# Patient Record
Sex: Female | Born: 1935 | Race: White | Hispanic: No | Marital: Married | Smoking: Former smoker
Health system: Southern US, Community
[De-identification: ages and names within clinical notes are randomized; demographics above are authoritative.]

## PROBLEM LIST (undated history)

## (undated) DIAGNOSIS — I509 Heart failure, unspecified: Secondary | ICD-10-CM

## (undated) DIAGNOSIS — R600 Localized edema: Secondary | ICD-10-CM

## (undated) DIAGNOSIS — IMO0002 Reserved for concepts with insufficient information to code with codable children: Secondary | ICD-10-CM

## (undated) DIAGNOSIS — J40 Bronchitis, not specified as acute or chronic: Secondary | ICD-10-CM

## (undated) DIAGNOSIS — Z5189 Encounter for other specified aftercare: Secondary | ICD-10-CM

## (undated) DIAGNOSIS — E6689 Other obesity not elsewhere classified: Secondary | ICD-10-CM

## (undated) DIAGNOSIS — Z860100 Personal history of colon polyps, unspecified: Secondary | ICD-10-CM

## (undated) DIAGNOSIS — J019 Acute sinusitis, unspecified: Secondary | ICD-10-CM

## (undated) DIAGNOSIS — J449 Chronic obstructive pulmonary disease, unspecified: Secondary | ICD-10-CM

## (undated) DIAGNOSIS — E668 Other obesity: Secondary | ICD-10-CM

## (undated) DIAGNOSIS — K219 Gastro-esophageal reflux disease without esophagitis: Secondary | ICD-10-CM

## (undated) DIAGNOSIS — L299 Pruritus, unspecified: Secondary | ICD-10-CM

## (undated) DIAGNOSIS — M810 Age-related osteoporosis without current pathological fracture: Secondary | ICD-10-CM

## (undated) DIAGNOSIS — I5031 Acute diastolic (congestive) heart failure: Secondary | ICD-10-CM

## (undated) DIAGNOSIS — I2510 Atherosclerotic heart disease of native coronary artery without angina pectoris: Secondary | ICD-10-CM

## (undated) DIAGNOSIS — I639 Cerebral infarction, unspecified: Secondary | ICD-10-CM

## (undated) DIAGNOSIS — I10 Essential (primary) hypertension: Secondary | ICD-10-CM

## (undated) DIAGNOSIS — G8929 Other chronic pain: Secondary | ICD-10-CM

## (undated) DIAGNOSIS — N952 Postmenopausal atrophic vaginitis: Secondary | ICD-10-CM

## (undated) DIAGNOSIS — M159 Polyosteoarthritis, unspecified: Secondary | ICD-10-CM

## (undated) DIAGNOSIS — R609 Edema, unspecified: Secondary | ICD-10-CM

## (undated) DIAGNOSIS — E662 Morbid (severe) obesity with alveolar hypoventilation: Secondary | ICD-10-CM

## (undated) DIAGNOSIS — I701 Atherosclerosis of renal artery: Secondary | ICD-10-CM

## (undated) DIAGNOSIS — I480 Paroxysmal atrial fibrillation: Secondary | ICD-10-CM

## (undated) DIAGNOSIS — D649 Anemia, unspecified: Secondary | ICD-10-CM

## (undated) DIAGNOSIS — R001 Bradycardia, unspecified: Secondary | ICD-10-CM

## (undated) DIAGNOSIS — I82409 Acute embolism and thrombosis of unspecified deep veins of unspecified lower extremity: Secondary | ICD-10-CM

## (undated) DIAGNOSIS — R0902 Hypoxemia: Secondary | ICD-10-CM

## (undated) DIAGNOSIS — G4733 Obstructive sleep apnea (adult) (pediatric): Secondary | ICD-10-CM

## (undated) DIAGNOSIS — E039 Hypothyroidism, unspecified: Secondary | ICD-10-CM

## (undated) DIAGNOSIS — C801 Malignant (primary) neoplasm, unspecified: Secondary | ICD-10-CM

## (undated) DIAGNOSIS — R911 Solitary pulmonary nodule: Secondary | ICD-10-CM

## (undated) HISTORY — PX: ELBOW SURGERY: SHX618

## (undated) HISTORY — DX: Essential (primary) hypertension: I10

## (undated) HISTORY — DX: Atherosclerotic heart disease of native coronary artery without angina pectoris: I25.10

## (undated) HISTORY — DX: Paroxysmal atrial fibrillation: I48.0

## (undated) HISTORY — PX: ROTATOR CUFF REPAIR: SHX139

## (undated) HISTORY — PX: LUMBAR SPINE SURGERY: SHX701

## (undated) HISTORY — DX: Acute sinusitis, unspecified: J01.90

## (undated) HISTORY — DX: Hypoxemia: R09.02

## (undated) HISTORY — DX: Personal history of colonic polyps: Z86.010

## (undated) HISTORY — DX: Gastro-esophageal reflux disease without esophagitis: K21.9

## (undated) HISTORY — DX: Polyosteoarthritis, unspecified: M15.9

## (undated) HISTORY — DX: Other obesity not elsewhere classified: E66.89

## (undated) HISTORY — DX: Acute embolism and thrombosis of unspecified deep veins of unspecified lower extremity: I82.409

## (undated) HISTORY — DX: Anemia, unspecified: D64.9

## (undated) HISTORY — DX: Bradycardia, unspecified: R00.1

## (undated) HISTORY — DX: Solitary pulmonary nodule: R91.1

## (undated) HISTORY — PX: DILATION AND CURETTAGE OF UTERUS: SHX78

## (undated) HISTORY — PX: HEMORROIDECTOMY: SUR656

## (undated) HISTORY — DX: Malignant (primary) neoplasm, unspecified: C80.1

## (undated) HISTORY — DX: Age-related osteoporosis without current pathological fracture: M81.0

## (undated) HISTORY — DX: Personal history of colon polyps, unspecified: Z86.0100

## (undated) HISTORY — PX: CHOLECYSTECTOMY: SHX55

## (undated) HISTORY — PX: OTHER SURGICAL HISTORY: SHX169

## (undated) HISTORY — DX: Heart failure, unspecified: I50.9

## (undated) HISTORY — DX: Edema, unspecified: R60.9

## (undated) HISTORY — DX: Bronchitis, not specified as acute or chronic: J40

## (undated) HISTORY — DX: Localized edema: R60.0

## (undated) HISTORY — DX: Pruritus, unspecified: L29.9

## (undated) HISTORY — DX: Morbid (severe) obesity with alveolar hypoventilation: E66.2

## (undated) HISTORY — DX: Postmenopausal atrophic vaginitis: N95.2

## (undated) HISTORY — DX: Cerebral infarction, unspecified: I63.9

## (undated) HISTORY — DX: Hypothyroidism, unspecified: E03.9

## (undated) HISTORY — DX: Encounter for other specified aftercare: Z51.89

## (undated) HISTORY — DX: Chronic obstructive pulmonary disease, unspecified: J44.9

## (undated) HISTORY — DX: Obstructive sleep apnea (adult) (pediatric): G47.33

## (undated) HISTORY — DX: Other obesity: E66.8

## (undated) HISTORY — DX: Atherosclerosis of renal artery: I70.1

---

## 1957-08-13 DIAGNOSIS — C801 Malignant (primary) neoplasm, unspecified: Secondary | ICD-10-CM

## 1957-08-13 HISTORY — PX: OOPHORECTOMY: SHX86

## 1957-08-13 HISTORY — PX: BILATERAL SALPINGOOPHORECTOMY: SHX1223

## 1957-08-13 HISTORY — DX: Malignant (primary) neoplasm, unspecified: C80.1

## 1996-08-13 HISTORY — PX: BREAST SURGERY: SHX581

## 1999-02-20 ENCOUNTER — Other Ambulatory Visit: Admission: RE | Admit: 1999-02-20 | Discharge: 1999-02-20 | Payer: Self-pay | Admitting: Obstetrics and Gynecology

## 2000-04-12 ENCOUNTER — Other Ambulatory Visit: Admission: RE | Admit: 2000-04-12 | Discharge: 2000-04-12 | Payer: Self-pay | Admitting: Obstetrics and Gynecology

## 2001-01-24 ENCOUNTER — Other Ambulatory Visit: Admission: RE | Admit: 2001-01-24 | Discharge: 2001-01-24 | Payer: Self-pay | Admitting: Obstetrics and Gynecology

## 2001-02-14 ENCOUNTER — Emergency Department (HOSPITAL_COMMUNITY): Admission: EM | Admit: 2001-02-14 | Discharge: 2001-02-14 | Payer: Self-pay

## 2001-08-13 DIAGNOSIS — I82409 Acute embolism and thrombosis of unspecified deep veins of unspecified lower extremity: Secondary | ICD-10-CM

## 2001-08-13 HISTORY — DX: Acute embolism and thrombosis of unspecified deep veins of unspecified lower extremity: I82.409

## 2002-02-06 ENCOUNTER — Other Ambulatory Visit: Admission: RE | Admit: 2002-02-06 | Discharge: 2002-02-06 | Payer: Self-pay | Admitting: Obstetrics and Gynecology

## 2002-04-20 ENCOUNTER — Encounter: Payer: Self-pay | Admitting: Family Medicine

## 2002-04-20 ENCOUNTER — Encounter: Admission: RE | Admit: 2002-04-20 | Discharge: 2002-04-20 | Payer: Self-pay | Admitting: Family Medicine

## 2002-07-17 ENCOUNTER — Encounter: Payer: Self-pay | Admitting: Family Medicine

## 2002-07-17 ENCOUNTER — Encounter: Admission: RE | Admit: 2002-07-17 | Discharge: 2002-07-17 | Payer: Self-pay | Admitting: Family Medicine

## 2002-10-23 ENCOUNTER — Encounter: Admission: RE | Admit: 2002-10-23 | Discharge: 2002-10-23 | Payer: Self-pay | Admitting: Family Medicine

## 2002-10-23 ENCOUNTER — Encounter: Payer: Self-pay | Admitting: Family Medicine

## 2003-04-09 ENCOUNTER — Other Ambulatory Visit: Admission: RE | Admit: 2003-04-09 | Discharge: 2003-04-09 | Payer: Self-pay | Admitting: Obstetrics and Gynecology

## 2003-08-03 ENCOUNTER — Ambulatory Visit (HOSPITAL_COMMUNITY): Admission: RE | Admit: 2003-08-03 | Discharge: 2003-08-03 | Payer: Self-pay | Admitting: Internal Medicine

## 2004-07-20 ENCOUNTER — Inpatient Hospital Stay (HOSPITAL_COMMUNITY): Admission: RE | Admit: 2004-07-20 | Discharge: 2004-07-23 | Payer: Self-pay | Admitting: Orthopedic Surgery

## 2004-10-12 ENCOUNTER — Ambulatory Visit: Payer: Self-pay | Admitting: Internal Medicine

## 2005-05-15 ENCOUNTER — Encounter: Admission: RE | Admit: 2005-05-15 | Discharge: 2005-05-15 | Payer: Self-pay | Admitting: Family Medicine

## 2005-11-30 ENCOUNTER — Ambulatory Visit: Payer: Self-pay | Admitting: Internal Medicine

## 2005-12-20 ENCOUNTER — Ambulatory Visit: Payer: Self-pay | Admitting: Emergency Medicine

## 2005-12-28 ENCOUNTER — Ambulatory Visit: Payer: Self-pay | Admitting: Internal Medicine

## 2006-01-28 ENCOUNTER — Ambulatory Visit: Payer: Self-pay | Admitting: Internal Medicine

## 2006-03-04 ENCOUNTER — Ambulatory Visit: Payer: Self-pay | Admitting: Internal Medicine

## 2006-04-16 ENCOUNTER — Ambulatory Visit: Payer: Self-pay | Admitting: Internal Medicine

## 2006-05-28 ENCOUNTER — Ambulatory Visit: Payer: Self-pay | Admitting: Internal Medicine

## 2006-09-30 ENCOUNTER — Ambulatory Visit: Payer: Self-pay | Admitting: Internal Medicine

## 2006-11-12 ENCOUNTER — Ambulatory Visit: Payer: Self-pay | Admitting: Internal Medicine

## 2006-12-19 ENCOUNTER — Ambulatory Visit: Payer: Self-pay | Admitting: Internal Medicine

## 2006-12-26 ENCOUNTER — Ambulatory Visit: Payer: Self-pay | Admitting: Pulmonary Disease

## 2007-01-01 ENCOUNTER — Ambulatory Visit: Payer: Self-pay | Admitting: Internal Medicine

## 2007-01-28 ENCOUNTER — Ambulatory Visit: Payer: Self-pay | Admitting: Internal Medicine

## 2007-03-10 ENCOUNTER — Ambulatory Visit: Payer: Self-pay | Admitting: Internal Medicine

## 2007-05-20 ENCOUNTER — Encounter: Admission: RE | Admit: 2007-05-20 | Discharge: 2007-05-20 | Payer: Self-pay | Admitting: Interventional Cardiology

## 2007-05-26 ENCOUNTER — Ambulatory Visit: Payer: Self-pay | Admitting: Pulmonary Disease

## 2007-05-28 ENCOUNTER — Ambulatory Visit: Payer: Self-pay | Admitting: Internal Medicine

## 2007-06-10 ENCOUNTER — Ambulatory Visit: Payer: Self-pay | Admitting: Internal Medicine

## 2007-06-12 ENCOUNTER — Inpatient Hospital Stay (HOSPITAL_COMMUNITY): Admission: RE | Admit: 2007-06-12 | Discharge: 2007-06-13 | Payer: Self-pay | Admitting: Interventional Cardiology

## 2007-07-03 ENCOUNTER — Inpatient Hospital Stay (HOSPITAL_COMMUNITY): Admission: RE | Admit: 2007-07-03 | Discharge: 2007-07-04 | Payer: Self-pay | Admitting: Interventional Cardiology

## 2007-08-14 DIAGNOSIS — Z5189 Encounter for other specified aftercare: Secondary | ICD-10-CM

## 2007-08-14 HISTORY — DX: Encounter for other specified aftercare: Z51.89

## 2007-08-15 ENCOUNTER — Ambulatory Visit: Payer: Self-pay | Admitting: Internal Medicine

## 2007-08-15 DIAGNOSIS — J45901 Unspecified asthma with (acute) exacerbation: Secondary | ICD-10-CM | POA: Insufficient documentation

## 2007-08-15 DIAGNOSIS — K219 Gastro-esophageal reflux disease without esophagitis: Secondary | ICD-10-CM | POA: Insufficient documentation

## 2007-08-18 ENCOUNTER — Telehealth: Payer: Self-pay | Admitting: Internal Medicine

## 2007-08-19 ENCOUNTER — Encounter: Payer: Self-pay | Admitting: Internal Medicine

## 2007-08-19 DIAGNOSIS — J309 Allergic rhinitis, unspecified: Secondary | ICD-10-CM | POA: Insufficient documentation

## 2007-08-19 DIAGNOSIS — E1165 Type 2 diabetes mellitus with hyperglycemia: Secondary | ICD-10-CM

## 2007-08-21 ENCOUNTER — Ambulatory Visit: Payer: Self-pay | Admitting: Internal Medicine

## 2007-08-21 LAB — CONVERTED CEMR LAB
Basophils Relative: 3.1 % — ABNORMAL HIGH (ref 0.0–1.0)
CO2: 29 meq/L (ref 19–32)
Eosinophils Absolute: 0 10*3/uL (ref 0.0–0.6)
GFR calc Af Amer: 70 mL/min
GFR calc non Af Amer: 58 mL/min
Glucose, Bld: 225 mg/dL — ABNORMAL HIGH (ref 70–99)
Hemoglobin: 8.9 g/dL — ABNORMAL LOW (ref 12.0–15.0)
MCHC: 33.1 g/dL (ref 30.0–36.0)
MCV: 91.8 fL (ref 78.0–100.0)
Monocytes Relative: 5.3 % (ref 3.0–11.0)
Platelets: 405 10*3/uL — ABNORMAL HIGH (ref 150–400)
Potassium: 3.8 meq/L (ref 3.5–5.1)
RBC: 2.92 M/uL — ABNORMAL LOW (ref 3.87–5.11)
RDW: 15.2 % — ABNORMAL HIGH (ref 11.5–14.6)
Sodium: 135 meq/L (ref 135–145)

## 2007-08-25 ENCOUNTER — Ambulatory Visit (HOSPITAL_COMMUNITY): Admission: RE | Admit: 2007-08-25 | Discharge: 2007-08-25 | Payer: Self-pay | Admitting: Adult Health

## 2007-08-25 ENCOUNTER — Ambulatory Visit: Payer: Self-pay | Admitting: Pulmonary Disease

## 2007-08-25 DIAGNOSIS — R0902 Hypoxemia: Secondary | ICD-10-CM | POA: Insufficient documentation

## 2007-08-25 DIAGNOSIS — D649 Anemia, unspecified: Secondary | ICD-10-CM

## 2007-08-26 ENCOUNTER — Telehealth (INDEPENDENT_AMBULATORY_CARE_PROVIDER_SITE_OTHER): Payer: Self-pay | Admitting: *Deleted

## 2007-08-27 ENCOUNTER — Inpatient Hospital Stay (HOSPITAL_COMMUNITY): Admission: EM | Admit: 2007-08-27 | Discharge: 2007-09-13 | Payer: Self-pay | Admitting: Emergency Medicine

## 2007-08-27 ENCOUNTER — Ambulatory Visit: Payer: Self-pay | Admitting: Vascular Surgery

## 2007-08-27 ENCOUNTER — Encounter (INDEPENDENT_AMBULATORY_CARE_PROVIDER_SITE_OTHER): Payer: Self-pay | Admitting: Internal Medicine

## 2007-08-29 ENCOUNTER — Encounter (INDEPENDENT_AMBULATORY_CARE_PROVIDER_SITE_OTHER): Payer: Self-pay | Admitting: Gastroenterology

## 2007-09-02 ENCOUNTER — Encounter (INDEPENDENT_AMBULATORY_CARE_PROVIDER_SITE_OTHER): Payer: Self-pay | Admitting: Cardiology

## 2007-09-12 ENCOUNTER — Encounter: Payer: Self-pay | Admitting: Internal Medicine

## 2007-09-24 ENCOUNTER — Other Ambulatory Visit: Admission: RE | Admit: 2007-09-24 | Discharge: 2007-09-24 | Payer: Self-pay | Admitting: Obstetrics and Gynecology

## 2007-10-28 ENCOUNTER — Encounter: Payer: Self-pay | Admitting: Internal Medicine

## 2007-11-07 ENCOUNTER — Ambulatory Visit: Payer: Self-pay | Admitting: Internal Medicine

## 2007-11-07 DIAGNOSIS — J984 Other disorders of lung: Secondary | ICD-10-CM

## 2007-11-07 DIAGNOSIS — J018 Other acute sinusitis: Secondary | ICD-10-CM

## 2007-11-24 ENCOUNTER — Ambulatory Visit: Payer: Self-pay | Admitting: Cardiology

## 2007-12-08 ENCOUNTER — Ambulatory Visit: Payer: Self-pay | Admitting: Internal Medicine

## 2008-01-08 ENCOUNTER — Ambulatory Visit: Payer: Self-pay | Admitting: Internal Medicine

## 2008-01-16 ENCOUNTER — Telehealth: Payer: Self-pay | Admitting: Internal Medicine

## 2008-01-18 LAB — CONVERTED CEMR LAB
Basophils Absolute: 0 K/uL
Basophils Relative: 0.6 %
Eosinophils Absolute: 0.2 K/uL
Eosinophils Relative: 2.5 %
HCT: 33.1 % — ABNORMAL LOW
Hemoglobin: 10.9 g/dL — ABNORMAL LOW
Lymphocytes Relative: 21 %
MCHC: 33 g/dL
MCV: 88.5 fL
Monocytes Absolute: 0.6 K/uL
Monocytes Relative: 8.4 %
Neutro Abs: 5.1 K/uL
Neutrophils Relative %: 67.5 %
Platelets: 331 K/uL
RBC: 3.74 M/uL — ABNORMAL LOW
RDW: 15.6 % — ABNORMAL HIGH
WBC: 7.5 10*3/microliter

## 2008-03-01 ENCOUNTER — Telehealth: Payer: Self-pay | Admitting: Internal Medicine

## 2008-03-02 ENCOUNTER — Encounter: Admission: RE | Admit: 2008-03-02 | Discharge: 2008-03-02 | Payer: Self-pay | Admitting: Family Medicine

## 2008-03-30 ENCOUNTER — Ambulatory Visit: Payer: Self-pay | Admitting: Internal Medicine

## 2008-04-05 ENCOUNTER — Telehealth (INDEPENDENT_AMBULATORY_CARE_PROVIDER_SITE_OTHER): Payer: Self-pay | Admitting: *Deleted

## 2008-05-19 ENCOUNTER — Ambulatory Visit: Payer: Self-pay | Admitting: Internal Medicine

## 2008-05-31 ENCOUNTER — Ambulatory Visit: Payer: Self-pay | Admitting: Internal Medicine

## 2008-06-02 ENCOUNTER — Ambulatory Visit: Payer: Self-pay | Admitting: Cardiology

## 2008-09-28 ENCOUNTER — Ambulatory Visit: Payer: Self-pay | Admitting: Internal Medicine

## 2008-09-28 DIAGNOSIS — J40 Bronchitis, not specified as acute or chronic: Secondary | ICD-10-CM | POA: Insufficient documentation

## 2008-10-19 ENCOUNTER — Ambulatory Visit: Payer: Self-pay | Admitting: Internal Medicine

## 2009-01-04 ENCOUNTER — Encounter: Payer: Self-pay | Admitting: Internal Medicine

## 2009-01-27 ENCOUNTER — Ambulatory Visit: Payer: Self-pay | Admitting: Internal Medicine

## 2009-02-03 ENCOUNTER — Telehealth (INDEPENDENT_AMBULATORY_CARE_PROVIDER_SITE_OTHER): Payer: Self-pay | Admitting: *Deleted

## 2009-02-07 ENCOUNTER — Telehealth: Payer: Self-pay | Admitting: Internal Medicine

## 2009-02-09 ENCOUNTER — Encounter: Payer: Self-pay | Admitting: Internal Medicine

## 2009-02-15 ENCOUNTER — Telehealth (INDEPENDENT_AMBULATORY_CARE_PROVIDER_SITE_OTHER): Payer: Self-pay | Admitting: *Deleted

## 2009-03-14 ENCOUNTER — Telehealth: Payer: Self-pay | Admitting: Internal Medicine

## 2009-04-22 ENCOUNTER — Ambulatory Visit: Payer: Self-pay | Admitting: Internal Medicine

## 2009-06-01 ENCOUNTER — Telehealth (INDEPENDENT_AMBULATORY_CARE_PROVIDER_SITE_OTHER): Payer: Self-pay | Admitting: *Deleted

## 2009-06-18 IMAGING — CR DG CHEST 2V
2 series · 2 of 2 positions shown · non-contrast
Comparison: 05/28/07.

CLINICAL DATA: 73 year old female; shortness of breath, cough, hypertension, asthma.
CHEST - 2 VIEW - 08/21/07:

[view not recorded (1 of 2)]
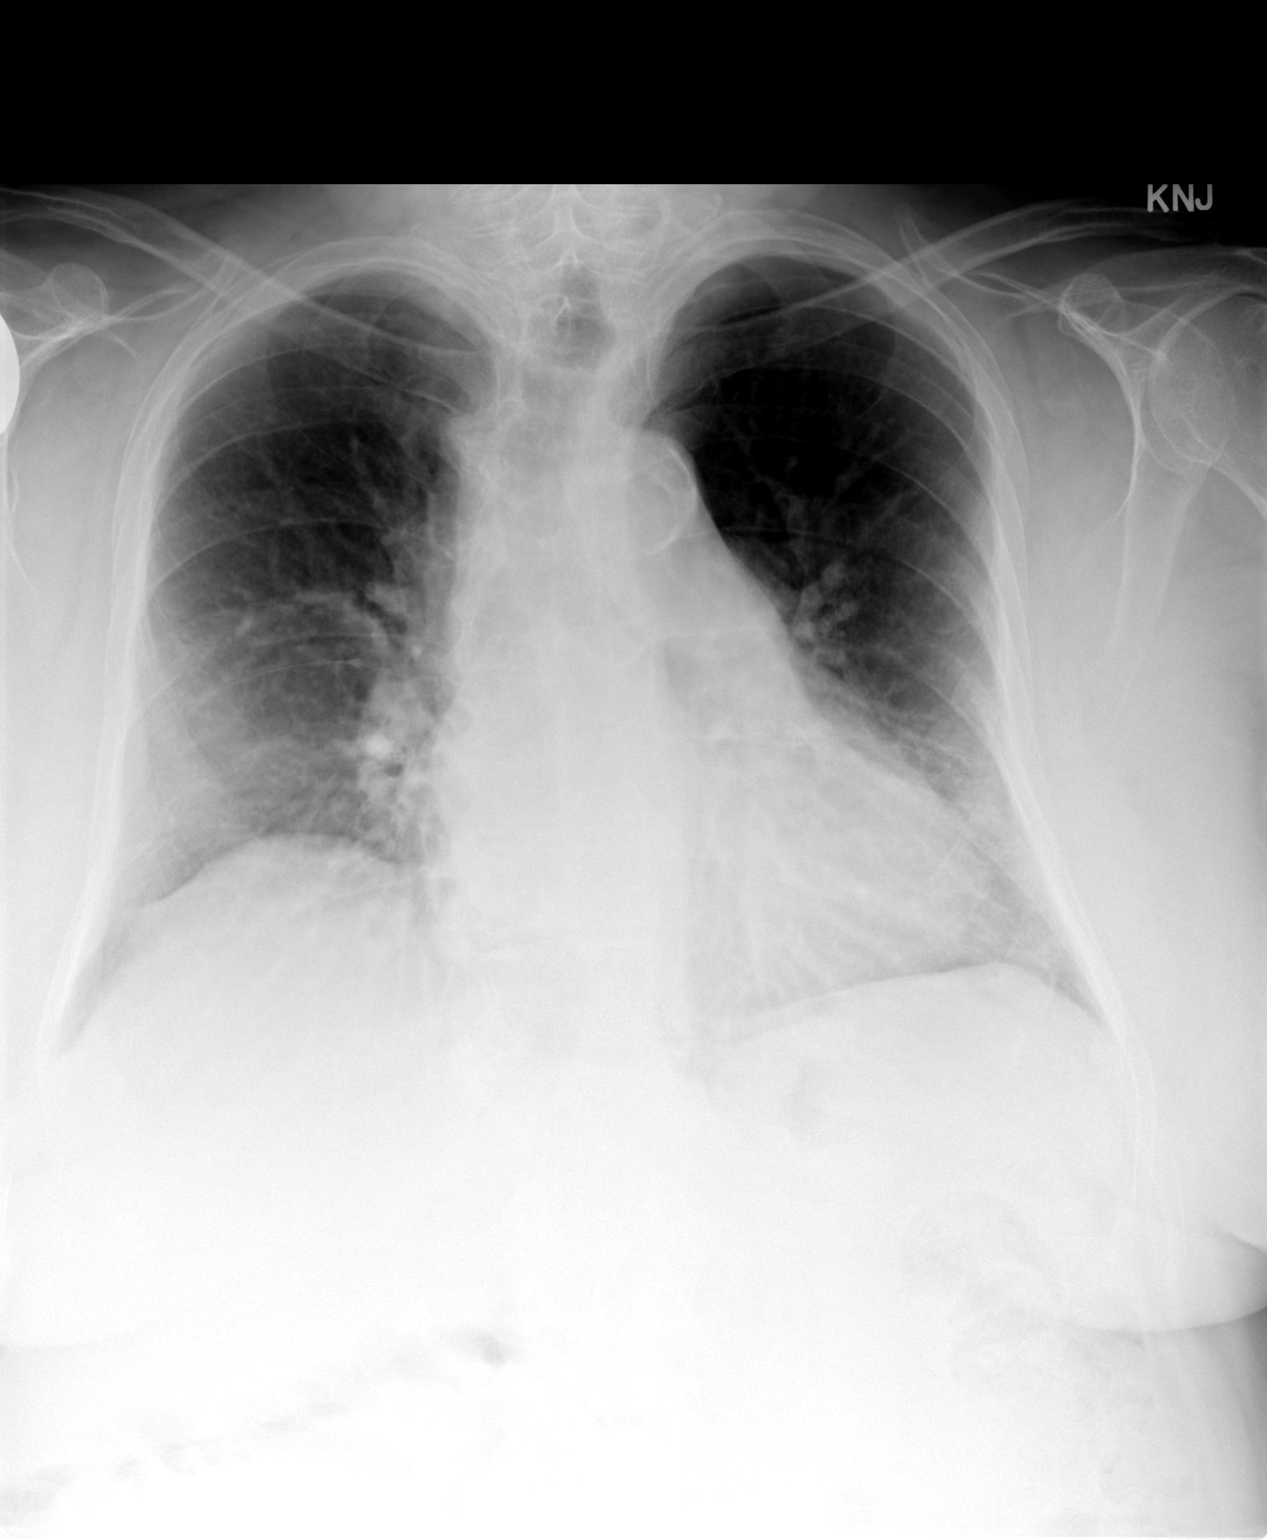

[view not recorded (2 of 2)]
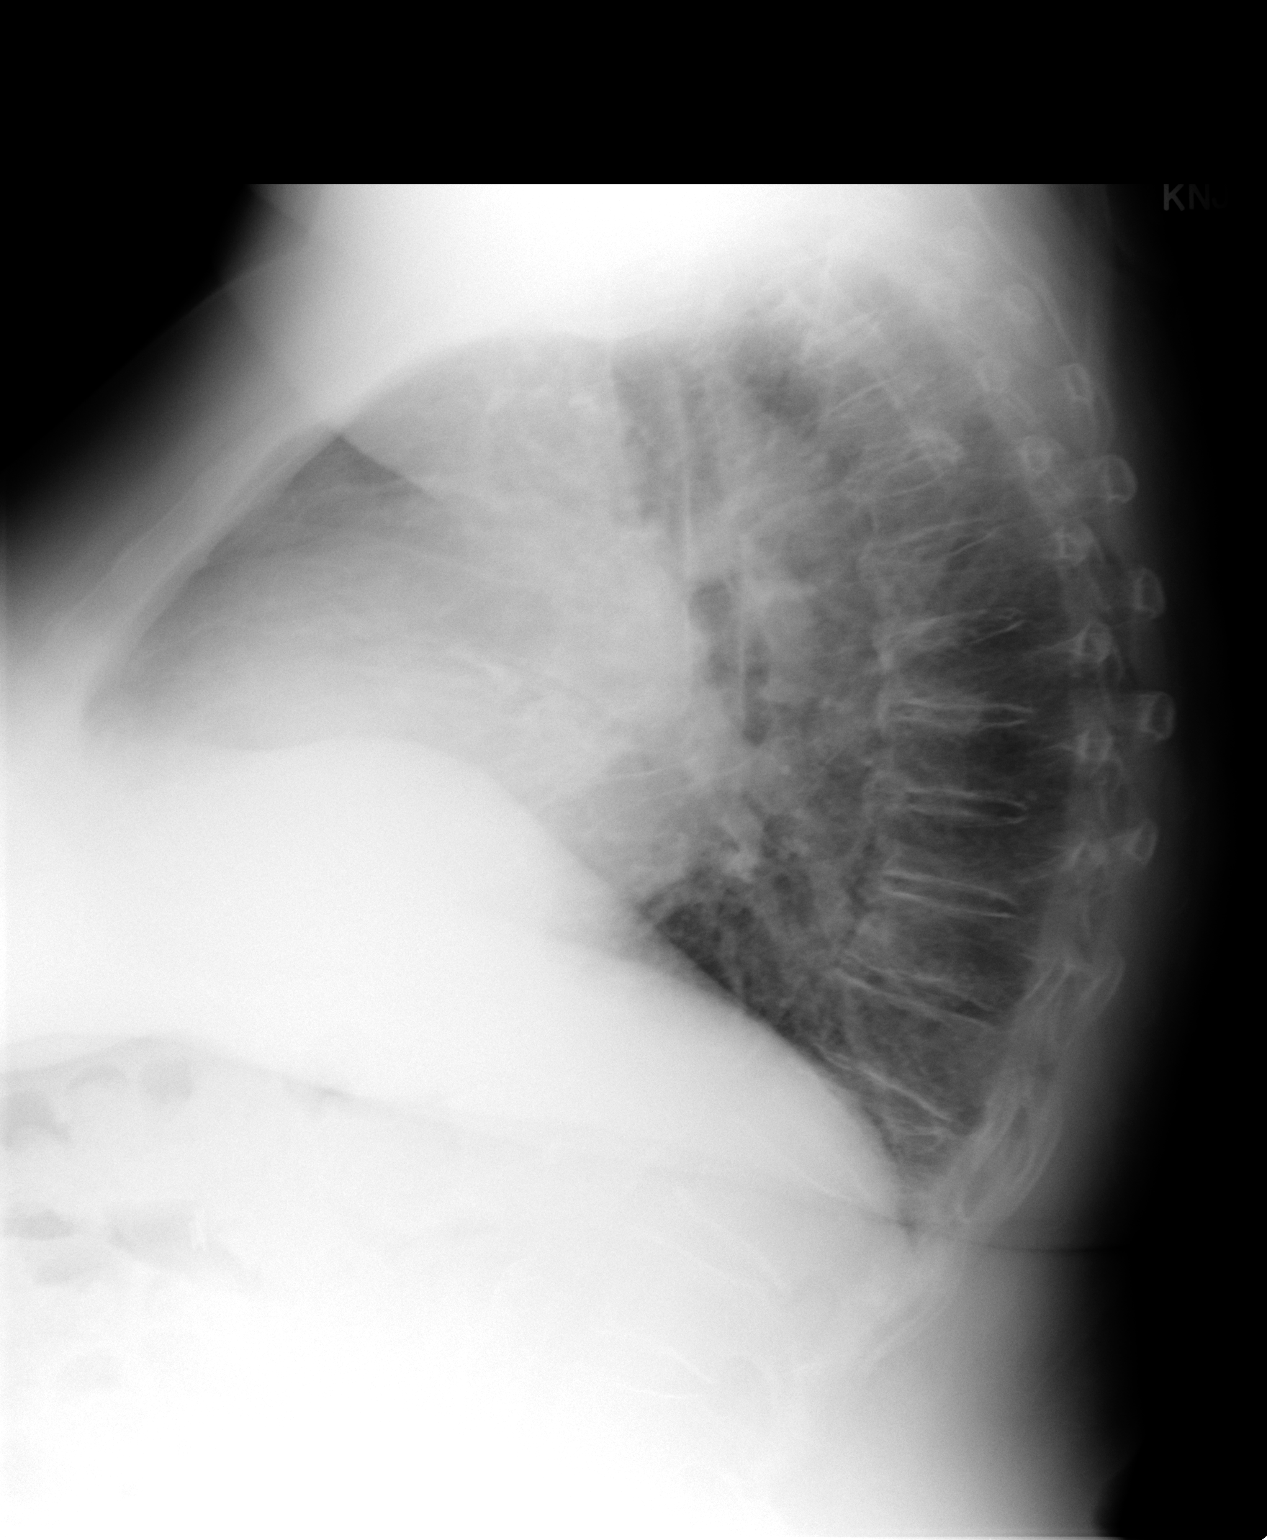

[2 of 2 positions shown; findings below may reference images not displayed]

FINDINGS: Mild cardiomegaly, vascular congestion, and bronchial thickening.  Interstitial prominence remains in the lower lobes.  The right hemidiaphragm remains elevated.  No new superimposed pneumonia, edema, large effusion, or pneumothorax.  Atherosclerosis of the aorta.  Degenerative change of the thoracic spine.  Chronic appearing compression fracture, stable, at T12.  Previous right shoulder arthroplasty.
IMPRESSION: Stable chronic changes as described.  No acute process.

## 2009-06-28 ENCOUNTER — Ambulatory Visit: Payer: Self-pay | Admitting: Internal Medicine

## 2009-07-29 ENCOUNTER — Ambulatory Visit: Payer: Self-pay | Admitting: Internal Medicine

## 2009-10-27 ENCOUNTER — Telehealth: Payer: Self-pay | Admitting: Internal Medicine

## 2009-12-01 ENCOUNTER — Ambulatory Visit: Payer: Self-pay | Admitting: Internal Medicine

## 2009-12-01 DIAGNOSIS — E678 Other specified hyperalimentation: Secondary | ICD-10-CM | POA: Insufficient documentation

## 2009-12-07 ENCOUNTER — Ambulatory Visit: Payer: Self-pay | Admitting: Internal Medicine

## 2009-12-09 ENCOUNTER — Telehealth (INDEPENDENT_AMBULATORY_CARE_PROVIDER_SITE_OTHER): Payer: Self-pay | Admitting: *Deleted

## 2009-12-28 ENCOUNTER — Encounter: Payer: Self-pay | Admitting: Internal Medicine

## 2009-12-31 ENCOUNTER — Encounter: Payer: Self-pay | Admitting: Internal Medicine

## 2009-12-31 ENCOUNTER — Emergency Department (HOSPITAL_COMMUNITY)
Admission: EM | Admit: 2009-12-31 | Discharge: 2009-12-31 | Payer: Self-pay | Source: Home / Self Care | Admitting: Emergency Medicine

## 2010-01-11 ENCOUNTER — Encounter: Payer: Self-pay | Admitting: Internal Medicine

## 2010-02-09 DIAGNOSIS — I2510 Atherosclerotic heart disease of native coronary artery without angina pectoris: Secondary | ICD-10-CM

## 2010-02-09 DIAGNOSIS — E039 Hypothyroidism, unspecified: Secondary | ICD-10-CM

## 2010-02-09 DIAGNOSIS — I701 Atherosclerosis of renal artery: Secondary | ICD-10-CM

## 2010-02-09 DIAGNOSIS — I10 Essential (primary) hypertension: Secondary | ICD-10-CM

## 2010-02-09 DIAGNOSIS — I4891 Unspecified atrial fibrillation: Secondary | ICD-10-CM | POA: Insufficient documentation

## 2010-02-09 DIAGNOSIS — J449 Chronic obstructive pulmonary disease, unspecified: Secondary | ICD-10-CM | POA: Insufficient documentation

## 2010-02-09 DIAGNOSIS — I498 Other specified cardiac arrhythmias: Secondary | ICD-10-CM

## 2010-02-09 DIAGNOSIS — I959 Hypotension, unspecified: Secondary | ICD-10-CM | POA: Insufficient documentation

## 2010-02-09 DIAGNOSIS — M159 Polyosteoarthritis, unspecified: Secondary | ICD-10-CM | POA: Insufficient documentation

## 2010-02-09 DIAGNOSIS — I509 Heart failure, unspecified: Secondary | ICD-10-CM | POA: Insufficient documentation

## 2010-02-10 ENCOUNTER — Ambulatory Visit: Payer: Self-pay | Admitting: Internal Medicine

## 2010-04-03 ENCOUNTER — Ambulatory Visit (HOSPITAL_BASED_OUTPATIENT_CLINIC_OR_DEPARTMENT_OTHER)
Admission: RE | Admit: 2010-04-03 | Discharge: 2010-04-03 | Payer: Self-pay | Source: Home / Self Care | Admitting: Internal Medicine

## 2010-04-03 ENCOUNTER — Encounter: Payer: Self-pay | Admitting: Internal Medicine

## 2010-04-04 ENCOUNTER — Ambulatory Visit: Payer: Self-pay | Admitting: Internal Medicine

## 2010-04-09 DIAGNOSIS — G4733 Obstructive sleep apnea (adult) (pediatric): Secondary | ICD-10-CM

## 2010-04-15 ENCOUNTER — Ambulatory Visit: Payer: Self-pay | Admitting: Internal Medicine

## 2010-05-05 ENCOUNTER — Encounter: Payer: Self-pay | Admitting: Internal Medicine

## 2010-05-17 ENCOUNTER — Ambulatory Visit: Payer: Self-pay | Admitting: Internal Medicine

## 2010-05-24 ENCOUNTER — Encounter: Payer: Self-pay | Admitting: Internal Medicine

## 2010-06-15 ENCOUNTER — Ambulatory Visit: Payer: Self-pay | Admitting: Internal Medicine

## 2010-06-27 ENCOUNTER — Telehealth (INDEPENDENT_AMBULATORY_CARE_PROVIDER_SITE_OTHER): Payer: Self-pay | Admitting: *Deleted

## 2010-07-02 ENCOUNTER — Encounter: Payer: Self-pay | Admitting: Internal Medicine

## 2010-07-17 ENCOUNTER — Telehealth: Payer: Self-pay | Admitting: Internal Medicine

## 2010-08-23 ENCOUNTER — Telehealth (INDEPENDENT_AMBULATORY_CARE_PROVIDER_SITE_OTHER): Payer: Self-pay | Admitting: *Deleted

## 2010-09-06 ENCOUNTER — Ambulatory Visit (HOSPITAL_COMMUNITY)
Admission: RE | Admit: 2010-09-06 | Discharge: 2010-09-06 | Payer: Self-pay | Source: Home / Self Care | Attending: Interventional Cardiology | Admitting: Interventional Cardiology

## 2010-09-06 LAB — PROTIME-INR: Prothrombin Time: 13.2 seconds (ref 11.6–15.2)

## 2010-09-06 LAB — COMPREHENSIVE METABOLIC PANEL
ALT: 24 U/L (ref 0–35)
Alkaline Phosphatase: 83 U/L (ref 39–117)
Calcium: 9.5 mg/dL (ref 8.4–10.5)
GFR calc Af Amer: 52 mL/min — ABNORMAL LOW (ref >60)
GFR calc non Af Amer: 43 mL/min — ABNORMAL LOW (ref >60)
Glucose, Bld: 187 mg/dL — ABNORMAL HIGH (ref 70–99)
Potassium: 4.3 mEq/L (ref 3.5–5.1)
Sodium: 141 mEq/L (ref 135–145)
Total Bilirubin: 0.7 mg/dL (ref 0.3–1.2)

## 2010-09-06 LAB — CBC
Hemoglobin: 10.8 g/dL — ABNORMAL LOW (ref 12.0–15.0)
MCH: 28.8 pg (ref 26.0–34.0)
RDW: 14.7 % (ref 11.5–15.5)
WBC: 7.9 10*3/uL (ref 4.0–10.5)

## 2010-09-06 LAB — GLUCOSE, CAPILLARY: Glucose-Capillary: 152 mg/dL — ABNORMAL HIGH (ref 70–99)

## 2010-09-07 LAB — POCT ACTIVATED CLOTTING TIME
Activated Clotting Time: 181 seconds
Activated Clotting Time: 193 seconds

## 2010-09-14 ENCOUNTER — Encounter: Payer: Self-pay | Admitting: Internal Medicine

## 2010-09-14 ENCOUNTER — Ambulatory Visit: Admit: 2010-09-14 | Payer: Self-pay | Admitting: Internal Medicine

## 2010-09-14 ENCOUNTER — Ambulatory Visit (INDEPENDENT_AMBULATORY_CARE_PROVIDER_SITE_OTHER): Payer: Medicare Other | Admitting: Internal Medicine

## 2010-09-14 DIAGNOSIS — J40 Bronchitis, not specified as acute or chronic: Secondary | ICD-10-CM

## 2010-09-14 DIAGNOSIS — G4733 Obstructive sleep apnea (adult) (pediatric): Secondary | ICD-10-CM

## 2010-09-14 DIAGNOSIS — E678 Other specified hyperalimentation: Secondary | ICD-10-CM

## 2010-09-14 NOTE — Letter (Signed)
Summary: Statement of Medical Necessity/ [facility]  Statement of Medical Necessity/ [facility]   Imported By: Lennie Odor 06/05/2010 12:20:30  _____________________________________________________________________  External Attachment:    Type:   Image     Comment:   External Document

## 2010-09-14 NOTE — Assessment & Plan Note (Signed)
Summary: PER PT CALL/COUGH/CONGESTED/[facility]   Copy to:  Dr Eldridge Dace Primary Provider/Referring Provider:  Toniann Fail McNeil/ [facility]  CC:  Acute visit , pt c/o prod cough green, and increase sob x 10 days.  History of Present Illness: December 01, 2009- Chronic respiratory failure, asthma, allergic rhinitis, obesity/hypoventilation She blames pollen for increased chest congestion, porductive cogh/ yellow green, exertional dyspnea. Uncomfortable so not sleeping as well. Oxygen helps. Denies fever. Vaguely uncomfortable across anterior chest. She is told she snorts in her sleep. We discussed possible sleep apnea, which I explained. She thinks oxygen has helped. She will have her husband pay attention to how much this is happening.  April 04, 2010- Chronic respiratory failure, asthma, allergic rhinitis, obesity/ hypoventilation Had sleep study last night for Dr Johney Frame with results pending.  ADDEND- NPSG moderate obstructive sleep apnea, AHI 28/hr. Done wearing O2 2L. Insufficient sleep to pernit CPAP titration protocol. Noting more cough x 2-3 days with yellow sputum. She had been out of town and may have been exposed to a cold. Discussed antibiotic tolerance- she wil do ok with doxy with a probiotic. Continuous oxygen 1.5 at rest, 2L with exertion. Uses the rescue inhaler only with colds.  June 15, 2010- Chronic respiratory failure, asthma, allergic rhinitis, obesity/ hypoventilation, PAF Nurse-CC: Acute visit , pt c/o prod cough green, increase sob x 10 days Malaise, increased cough. Described cardiology issues lately- fluid retention, irregular pulse.  Home O2 is continuous 2 L. Arriving here on a pulse regulator, O2 sat was 79%, incr to 94% on our 3L continuous O2. At onset a week ago she may have had some fever, but denies sore throat . Cough is productive white/ green x 1-2 weeks, some brown.    Asthma History    Asthma Control Assessment:    Age range: 12+ years    Symptoms: >2  days/week    Nighttime Awakenings: 0-2/month    Interferes w/ normal activity: some limitations    SABA use (not for EIB): >2 days/week    Asthma Control Assessment: Not Well Controlled   Preventive Screening-Counseling & Management  Alcohol-Tobacco     Smoking Status: quit     Year Quit: 1980     Pack years: 10-15     Passive Smoke Exposure: yes     Tobacco Counseling: not to resume use of tobacco products     Passive Smoke Counseling: to avoid passive smoke exposure  Current Medications (verified): 1)  Mucinex Dm 30-600 Mg  Tb12 (Dextromethorphan-Guaifenesin) .... As Needed 2)  Prilosec Otc 20 Mg  Tbec (Omeprazole Magnesium) .... Take One 30-60 Min Before First and Last Meals of The Day 3)  Synthroid 25 Mcg  Tabs (Levothyroxine Sodium) .... Take One Tab By Mouth Once Daily Altermating With 1.5 Tab By Mouth Once Daily 4)  Singulair 10 Mg  Tabs (Montelukast Sodium) .... Take One By Mouth Once Daily 5)  Zyrtec Allergy 10 Mg  Tabs (Cetirizine Hcl) .... Once Daily 6)  Flexeril 10 Mg  Tabs (Cyclobenzaprine Hcl) .... Once Daily 7)  Multi Vitamins .... Once Daily 8)  Boniva 150 Mg  Tabs (Ibandronate Sodium) .... Take Once A Month 9)  Ventolin Hfa 108 (90 Base) Mcg/act Aers (Albuterol Sulfate) .... 2 Puffs Qid As Needed 10)  Symbicort 160-4.5 Mcg/act  Aero (Budesonide-Formoterol Fumarate) .... As Needed 11)  Nitroquick 0.4 Mg  Subl (Nitroglycerin) .... As Needed 12)  Plavix 75 Mg  Tabs (Clopidogrel Bisulfate) .... Take 1 By Mouth Once Daily 13)  Astelin 137 Mcg/spray  Soln (Azelastine Hcl) .... Take 2 Sprays Each Nostril Once Daily 14)  Imdur 30 Mg  Tb24 (Isosorbide Mononitrate) .Marland Kitchen.. 1 Daily 15)  Lasix 40 Mg  Tabs (Furosemide) .... Two Times A Day 16)  Metformin Hcl 1000 Mg  Tabs (Metformin Hcl) .... Take 1 By Mouth Two Times A Day 17)  Sorine 80 Mg  Tabs (Sotalol Hcl) .... Take 1 1/2  By Mouth Two Times A Day 18)  Lantus 100 Unit/ml  Soln (Insulin Glargine) .... Inject 28 Units At  Bedtime 19)  Glimepiride 4 Mg  Tabs (Glimepiride) .... Take 1 By Mouth Once Daily 20)  Ditropan Xl 10 Mg Xr24h-Tab (Oxybutynin Chloride) .... Take One By Mouth Once Daily 21)  Prenatal Vitamin .... Take 1 By Mouth Once Daily 22)  Oxygen 1.5l/m .... 1.5 Liters Sitting, 2lpm Walking 23)  Cpap Autotitration For Pressure Rec. .... Oxygen 2 L/m During Sleep 24)  Diltiazem Hcl 120 Mg Tabs (Diltiazem Hcl) .... 2  Tablets By Mouth Daily  Allergies (verified): 1)  ! Biaxin 2)  ! Erythromycin 3)  ! Ceftin 4)  ! Keflex 5)  ! Doxycycline 6)  ! Tetracycline  Past History:  Past Medical History: Last updated: 04/04/2010 Coronary artery disease status post drug-eluted stent in October,  2008, by Dr. Eldridge Dace.  History of renal artery stenosis status post stent placement  November, 2008.  CHF  PAROXYSMAL ATRIAL FIBRILLATION BRADYCARDIA HYPERTENSION  RENAL ARTERY STENOSIS  COPD OBESITY HYPOVENTILATION SYNDROME  Obstructive Sleep Apnea- NPSG 04/03/00- AHI 28/hr PRURITUS TRACHEOBRONCHITIS RHINOSINUSITIS, ACUTE ANEMIA HYPOXEMIA PERIPHERAL EDEMA ENDOGENOUS OBESITY DM  ALLERGIC RHINITIS  ACID REFLUX DISEASE GENERALIZED OSTEOARTHROSIS UNSPECIFIED SITE  HYPOTHYROIDISM Lung nodule in lingula  Past Surgical History: Last updated: 02/09/2010 Lumbar spine surgery Cholecystectomy hemorrhoidss Oophorectomy for ? cancer- remote Repair wound fistula Right shoulder Elbow  D&C History of ovarian cancer status post surgery.      Family History: Last updated: 04-08-2008 mother died age 64 from pneumonia, heart, allergies father died age 47 from heart attack brother alive age 48; heart-blood clots brother alive age 29; blockage in leg-bypass sister alive age 30;heart, DM, mild stroke.   DM-Grandfather, brother, sister Emphysema-Mother Allergies-Mother Heart Disease-Mother, Father, Brother Clotting Disorder- Brother Arthritis-Mother, Sister Cancer- Aunt (breast and bone)  Social  History: Last updated: 02/10/2010 Patient states former smoker. -quit in 1980 married- Husband has cancer esophagus 2 children  Exercises-3/week Caffeine-2 cups daily  Risk Factors: Smoking Status: quit (06/15/2010) Passive Smoke Exposure: yes (06/15/2010)  Review of Systems      See HPI       The patient complains of shortness of breath with activity, productive cough, and irregular heartbeats.  The patient denies shortness of breath at rest, coughing up blood, chest pain, acid heartburn, indigestion, loss of appetite, weight change, abdominal pain, difficulty swallowing, sore throat, tooth/dental problems, headaches, nasal congestion/difficulty breathing through nose, and sneezing.    Vital Signs:  Patient profile:   77 year old female Height:      61 inches Weight:      220.4 pounds BMI:     41.79 O2 Sat:      79 % on 2 L/min Pulse rate:   69 / minute BP sitting:   142 / 60  (right arm)  Vitals Entered By: Renold Genta RCP, LPN (June 15, 2010 4:09 PM)  O2 Sat at Rest %:  79% O2 Flow:  2 L/min  O2 Sat Comments 02 sats 79% on 2 liters  pulse system changed to 3 liters continous and 02 sats increased to 94% CC: Acute visit , pt c/o prod cough green, increase sob x 10 days   Physical Exam  Additional Exam:  General: A/Ox3; pleasant and cooperative, NAD,  morbidly obese, supplemental oxygen 94% 3 L/M. O2 sat on arrival was 79% on 2 L walking from waiting room. SKIN: no rash, lesions NODES: no lymphadenopathy HEENT: [place]/AT, EOM- WNL, Conjuctivae- clear, PERRLA, TM-WNL, Nose- clear, Throat- red without exudate, Mallampati III-IV, tonsils NECK: Supple w/ fair ROM, JVD- none, normal carotid impulses w/o bruits Thyroid- normal to palpation CHEST:Harsh cough , not wheezing, no rales HEART: RRR, no m/g/r heard. Pulse is currently regular ABDOMEN: obese ZOX:WRUE, nl pulses, no edema, varices NEURO: Grossly intact to observation      Impression &  Recommendations:  Problem # 1:  OBSTRUCTIVE SLEEP APNEA (ICD-327.23)  Continues compliant with CPAP with O2  Problem # 2:  ASTHMA, INTRINSIC, WITH ACUTE EXACERBATION (ICD-493.12) Acute bronchitic exacerbation. The discolored sputum suggests that we could help with a neb and antibiotic.  The ddx includes pulmonary edema- will get CXR.  She will need to run her oxygen at 3l for awhile till she feels better.   Problem # 3:  OBESITY HYPOVENTILATION SYNDROME (ICD-278.8) She is seriously overweight and hypoventilation is a likely result based on observation. She hasn't demonstrated ability to reduce her weight.   Medications Added to Medication List This Visit: 1)  Diltiazem Hcl 120 Mg Tabs (Diltiazem hcl) .... 2  tablets by mouth daily 2)  Avelox 400 Mg Tabs (Moxifloxacin hcl) .Marland Kitchen.. 1 daily x 5 days  Other Orders: Est. Patient Level IV (45409) Nebulizer Tx (81191) Depo- Medrol 80mg  (J1040) Admin of Therapeutic Inj  intramuscular or subcutaneous (47829) T-2 View CXR (71020TC)  Patient Instructions: 1)  Please schedule a follow-up appointment in 3 months. 2)  A chest x-ray has been recommended.  Your imaging study may require preauthorization.  3)  Neb xop 0.63 4)  depo 80 5)  script for avelox antibiotic sent to drug store 6)  run oxygen for now at 3 L/M until you feel  better, then drop back to 2 L. Prescriptions: AVELOX 400 MG TABS (MOXIFLOXACIN HCL) 1 daily x 5 days  #5 x 0   Entered and Authorized by:   Waymon Budge MD   Signed by:   Waymon Budge MD on 06/15/2010   Method used:   Electronically to        [id number]* (retail)       [address]       [place], [place]  [address]       Ph: [phone] or [phone]       Fax: [phone]   RxID:   [id number]    Medication Administration  Injection # 1:    Medication: Depo- Medrol 80mg     Diagnosis: COPD (ICD-496)    Route: IM    Site: RUOQ gluteus    Exp Date:  07/12/2010    Lot #: [id number]    Mfr: Teva  Orders Added: 1)  Est. Patient Level IV [56433] 2)  Nebulizer Tx [id number] 3)  Depo- Medrol 80mg  [J1040] 4)  Admin of Therapeutic Inj  intramuscular or subcutaneous [96372] 5)  T-2 View CXR [71020TC]

## 2010-09-14 NOTE — Progress Notes (Signed)
Summary: rx request/ cold sxreturned call- new ph# for call back  Phone Note Call from Patient Call back at Home Phone [phone]   Caller: Patient Call For: young Summary of Call: pt c/o non-productive cough and chest congestion x 2 days. says it's a "chest cold". nausea yesterday- mostly from coughing- denies fever/ chills. began taking OTC musinex today. requests a rx for this- doesn't want to be seen. [facility] [address].  Initial call taken by: [name], CNA,  October 27, 2009 10:58 AM  Follow-up for Phone Call        allergies: 1)  ! Biaxin 2)  ! Erythromycin 3)  ! Ceftin 4)  ! Keflex 5)  ! Doxycycline 6)  ! Tetracycline please advise. [name] CMA  October 27, 2009 12:01 PM   Additional Follow-up for Phone Call Additional follow up Details #1::        Per CDY- give amoxicillin 500mg  #21 take 1 by mouth three times a day no refills.[name] CMA  October 27, 2009 2:28 PM    lmomtcb [name]  October 27, 2009 2:43 PM     Additional Follow-up for Phone Call Additional follow up Details #2::    pt returned call. call her at [phone] asap. [name], CNA  October 27, 2009 3:40 PM   called pt back and she is aware of the amoxicillin sent to her pharmacy---she will call for any other problems [name] CMA  October 27, 2009 3:46 PM   New/Updated Medications: AMOXICILLIN 500 MG CAPS (AMOXICILLIN) take one tablet by mouth three times a day until gone Prescriptions: AMOXICILLIN 500 MG CAPS (AMOXICILLIN) take one tablet by mouth three times a day until gone  #21 x 0   Entered by:   [name] CMA   Authorized by:   Waymon Budge MD   Signed by:   [name] CMA on 10/27/2009   Method used:   Electronically to        [id number]* (retail)       [address]       [place], [place]  [address]       Ph: [phone] or [phone]       Fax: [phone]   RxID:   [id number]

## 2010-09-14 NOTE — Assessment & Plan Note (Signed)
Summary: nep/Afib/jml   Referring Provider:  Dr Eldridge Dace Primary Provider:      History of Present Illness: [name] is a pleasant 77 yo female with a h/o morbid obesity, COPD (o2 dependant), obesity/hypoventilatory syndrome and paroxysmal atrial fibrillation who  presents today for EP consultaiton.  She reports having atrial fibrillation for several years.  She reports symptoms of palpitations, SOB, and decreased exercise tolerance during her afib.  She has been followed by Dr Eldridge Dace and has been treated with sotalol.  She reports increasing frequency and duration of afib with sotalol and therefore recently had cardizem added.  She feels that her afib has significantly improved since that time.  She has a h/o chronic lung disease for which is followed by Dr Maple Hudson.  She denies CP, presyncope, syncope, or other concerns.  She has had several falls recently due to unsteadiness and does not take coumadin.  Current Medications (verified): 1)  Mucinex Dm 30-600 Mg  Tb12 (Dextromethorphan-Guaifenesin) .... 2 Every 12 Hours 2)  Prilosec Otc 20 Mg  Tbec (Omeprazole Magnesium) .... Take One 30-60 Min Before First and Last Meals of The Day 3)  Synthroid 25 Mcg  Tabs (Levothyroxine Sodium) .... Take One Tab By Mouth Once Daily Altermating With 1.5 Tab By Mouth Once Daily 4)  Singulair 10 Mg  Tabs (Montelukast Sodium) .... Take One By Mouth Once Daily 5)  Zyrtec Allergy 10 Mg  Tabs (Cetirizine Hcl) .... Once Daily 6)  Flexeril 10 Mg  Tabs (Cyclobenzaprine Hcl) .... Once Daily 7)  Multi Vitamins .... Once Daily 8)  Boniva 150 Mg  Tabs (Ibandronate Sodium) .... Take Once A Month 9)  Ventolin Hfa 108 (90 Base) Mcg/act Aers (Albuterol Sulfate) .... 2 Puffs Qid As Needed 10)  Symbicort 160-4.5 Mcg/act  Aero (Budesonide-Formoterol Fumarate) .... Take 2 Puffs Two Times A Day 11)  Nitroquick 0.4 Mg  Subl (Nitroglycerin) .... As Needed 12)  Plavix 75 Mg  Tabs (Clopidogrel Bisulfate) .... Take 1 By Mouth Once  Daily 13)  Astelin 137 Mcg/spray  Soln (Azelastine Hcl) .... Take 2 Sprays Each Nostril Once Daily 14)  Imdur 30 Mg  Tb24 (Isosorbide Mononitrate) .Marland Kitchen.. 1 Daily 15)  Lasix 40 Mg  Tabs (Furosemide) .Marland Kitchen.. 1 Extra By Mouth Once Daily As Needed Leg Swelling 16)  Metformin Hcl 1000 Mg  Tabs (Metformin Hcl) .... Take 1 By Mouth Two Times A Day 17)  Sorine 80 Mg  Tabs (Sotalol Hcl) .... Take 1 By Mouth Two Times A Day 18)  Lantus 100 Unit/ml  Soln (Insulin Glargine) .... Inject 25 Units At Bedtime 19)  Glimepiride 4 Mg  Tabs (Glimepiride) .... Take 1 By Mouth Once Daily 20)  Ditropan Xl 10 Mg Xr24h-Tab (Oxybutynin Chloride) .... Take One By Mouth Once Daily 21)  Prenatal Vitamin .... Take 1 By Mouth Once Daily 22)  Oxygen 1.5l/m .... 1.5 Liters Sitting, 2lpm Walking 23)  Diltiazem Hcl 120 Mg Tabs (Diltiazem Hcl) .Marland Kitchen.. 1 By Mouth Daily  Allergies (verified): 1)  ! Biaxin 2)  ! Erythromycin 3)  ! Ceftin 4)  ! Keflex 5)  ! Doxycycline 6)  ! Tetracycline  Past History:  Past Medical History: Coronary artery disease status post drug-eluted stent in October,  2008, by Dr. Eldridge Dace.  History of renal artery stenosis status post stent placement  November, 2008.  CHF  PAROXYSMAL ATRIAL FIBRILLATION BRADYCARDIA HYPERTENSION  RENAL ARTERY STENOSIS  COPD OBESITY HYPOVENTILATION SYNDROME  PRURITUS TRACHEOBRONCHITIS RHINOSINUSITIS, ACUTE ANEMIA HYPOXEMIA PERIPHERAL EDEMA ENDOGENOUS OBESITY DM  ALLERGIC RHINITIS  ACID REFLUX DISEASE GENERALIZED OSTEOARTHROSIS UNSPECIFIED SITE  HYPOTHYROIDISM Lung nodule in lingula  Past Surgical History: Reviewed history from 02/09/2010 and no changes required. Lumbar spine surgery Cholecystectomy hemorrhoidss Oophorectomy for ? cancer- remote Repair wound fistula Right shoulder Elbow  D&C History of ovarian cancer status post surgery.      Family History: Reviewed history from 04/05/2008 and no changes required. mother died age 27 from  pneumonia, heart, allergies father died age 50 from heart attack brother alive age 19; heart-blood clots brother alive age 90; blockage in leg-bypass sister alive age 62;heart, DM, mild stroke.   DM-Grandfather, brother, sister Emphysema-Mother Allergies-Mother Heart Disease-Mother, Father, Brother Clotting Disorder- Brother Arthritis-Mother, Sister Cancer- Aunt (breast and bone)  Social History: Patient states former smoker. -quit in 1980 married- Husband has cancer esophagus 2 children  Exercises-3/week Caffeine-2 cups daily  Review of Systems       All systems are reviewed and negative except as listed in the HPI.   Vital Signs:  Patient profile:   77 year old female Height:      61 inches Weight:      214 pounds BMI:     40.58 Pulse rate:   61 / minute Resp:     20 per minute BP sitting:   144 / 68  (left arm)  Vitals Entered By: Terri Coy, CNA (February 10, 2010 11:57 AM)  Physical Exam  General:  morbidly obese, chronically ill, NAD wearing O2 Head:  normocephalic and atraumatic Eyes:  PERRLA/EOM intact; conjunctiva and lids normal. Mouth:  Teeth, gums and palate normal. Oral mucosa normal. Neck:  supple, JVP 9cm Lungs:  CTAB Heart:  regular rate and rhythm, S1, S2 without murmurs, rubs, gallops, or clicks Abdomen:  Bowel sounds positive; abdomen soft and non-tender without masses, organomegaly, or hernias noted. No hepatosplenomegaly. Msk:  Back normal, normal gait. Muscle strength and tone normal. Pulses:  pulses normal in all 4 extremities Extremities:  No clubbing or cyanosis. Neurologic:  Alert and oriented x 3. Skin:  Intact without lesions or rashes. Cervical Nodes:  no significant adenopathy Psych:  Normal affect.   Cardiac Cath  Procedure date:  06/12/2007   1. Successful stenting of the left anterior descending with single-       vessel coronary disease.   2. Right renal artery stenosis.   3. Normal left ventricular function.             EKG  Procedure date:  02/10/2010  Findings:      sinus rhythm 61 bpm, QTc 460, otherwise normal ekg  Echocardiogram  Procedure date:  09/02/2007  Findings:       SUMMARY   -  Overall left ventricular systolic function was normal. Left         ventricular ejection fraction was estimated , range being 60         % to 65 %. Although no diagnostic left ventricular regional         wall motion abnormality was identified, this possibility         cannot be completely excluded on the basis of this study.   -  The aortic valve was mildly calcified. There was trivial aortic         valvular regurgitation.   -  There was mild mitral annular calcification.   -  The left atrium was dilated. (LA 46mm)   -  The inferior vena cava was mildly dilated. Respirophasic inferior  vena cava changes were blunted (less than 50% variation).  Impression & Recommendations:  Problem # 1:  ATRIAL FIBRILLATION (ICD-427.31) The patient has symptomatic paroxysmal atrial fibrillation.  Her afib appears to have improved with increase sotalol and cardizem.  She could potentionally have her sotalol increased to 160mg  two times a day if needed.  Therapeutic strategies for afib including medicine and ablation were discussed in detail with the patient today. Risk, benefits, and alternatives to EP study and radiofrequency ablation for afib were also discussed in detail today.  Given the patient's morbid obesity and chronic severe lung disaese with O2 dependance, she is a poor candidate for ablation.  In addition, given her lung disease and LA enlargement on prior echo, I suspect anticipate that success rates from ablation would be 50-60%.  I have therefore recommended ongoing medical therapy at this time.  Her CHADS2 score is elevated.  I would therefore recommend either pradaxa or coumadin for stroke prevention longterm.  At this point, our options are to: 1. continue current regimen 2. consider increasing  sotalol to 160mg  two times a day during hospitalized surveillance 3. consider tikosyn or multaq as an alternative to sotalol  I have recommended that she have her sleep study arranged with Dr Maple Hudson to evaluate for sleep apnea In addition, she should have an echo obtained through Dr Hoyle Barr office to evaluate her LA size and also evaluate for pulmonary htn/ RV enlargement given her lung disease Weight loss is also advised.  Problem # 2:  CHF (ICD-428.0) stable diastolic dysfunction echo as above  Problem # 3:  HYPERTENSION (ICD-401.9) stable  Problem # 4:  OBESITY HYPOVENTILATION SYNDROME (ICD-278.8) weight loss advised sleep study ordered  Other Orders: Misc. Referral (Misc. Ref)  Patient Instructions: 1)  Your physician has recommended that you have a sleep study.  This test records several body functions during sleep, including:  brain activity, eye movement, oxygen and carbon dioxide blood levels, heart rate and rhythm, breathing rate and rhythm, the flow of air through your mouth and nose, snoring, body muscle movements, and chest and belly movement. 2)  Your physician recommends that you schedule a follow-up appointment in: 3 months with Dr Johney Frame. 3)  Your physician recommends that you continue on your current medications as directed. Please refer to the Current Medication list given to you today.

## 2010-09-14 NOTE — Letter (Signed)
Summary: Physician Documentation Sheet  Physician Documentation Sheet   Imported By: Debby Freiberg 02/09/2010 13:24:42  _____________________________________________________________________  External Attachment:    Type:   Image     Comment:   External Document

## 2010-09-14 NOTE — Progress Notes (Signed)
Summary: NAUSEA/ COUGH  Phone Note Call from Patient Call back at Home Phone [phone]   Caller: Patient Call For: [name] Summary of Call: PT C/O NAUSEA "MAYBE FROM ABX- PT WAS SEEN 11/3 FOR COUGH (WHICH PT STILL HAS). NO FEVER. [facility] [address] Initial call taken by: [name], CNA,  June 27, 2010 3:46 PM  Follow-up for Phone Call        Spoke with pt.  She is c/o prod cough with yellow/green sputum, coughs to the point of gagging x 2 days.  She states that she finished avelox 1 wk ago.  Pls advise thanks! Allergies (verified):  1)  ! Biaxin 2)  ! Erythromycin 3)  ! Ceftin 4)  ! Keflex 5)  ! Doxycycline 6)  ! Tetracycline Follow-up by: Vernie Murders,  June 27, 2010 4:02 PM  Additional Follow-up for Phone Call Additional follow up Details #1::        Per CDY-give Benzonatate pearls 200mg  #30 take 1 three times a day as needed cough no refills.[name] CMA  June 27, 2010 5:04 PM     New/Updated Medications: BENZONATATE 200 MG CAPS (BENZONATATE) 1 by mouth three times a day as needed for cough Prescriptions: BENZONATATE 200 MG CAPS (BENZONATATE) 1 by mouth three times a day as needed for cough  #30 x 0   Entered by:   Vernie Murders   Authorized by:   Waymon Budge MD   Signed by:   Vernie Murders on 06/27/2010   Method used:   Electronically to        [facility]  [address] [id number]* (retail)       [address]       [place], [place]  [address]       Ph: [phone] or [phone]       Fax: [phone]   RxID:   [id number]

## 2010-09-14 NOTE — Progress Notes (Signed)
Summary: returned call  Phone Note Call from Patient Call back at Home Phone [phone]   Caller: Patient Call For: young Summary of Call: Returning Katie's call. Initial call taken by: Darletta Moll,  December 09, 2009 11:04 AM  Follow-up for Phone Call        Spoke with pt and advised of results per Dr Roxy Cedar append.  Pt verbalized understanding. Follow-up by: Vernie Murders,  December 09, 2009 11:09 AM

## 2010-09-14 NOTE — Progress Notes (Signed)
Summary: CPAP autotitrated to 16  Phone Note Other Incoming   Summary of Call: CPAP autotitrated to 16 cwp- good compliance.    New/Updated Medications: * CPAP 16 ADVANCED Oxygen 2 l/m during sleep

## 2010-09-14 NOTE — Letter (Signed)
Summary: [name] Office Visit Note   [facility] Physicians Office Visit Note   Imported By: Roderic Ovens 02/23/2010 16:11:14  _____________________________________________________________________  External Attachment:    Type:   Image     Comment:   External Document

## 2010-09-14 NOTE — Progress Notes (Signed)
Summary: prescription  Phone Note Call from Patient   Caller: Patient Call For: Dr Maple Hudson Summary of Call: Patient phoned and would like for a nurse to call her back she has a cold and her chest is hurting and she is short winded. She doesnt feel like coming out and wants to know if he will call a prescription for an antibiotic to [facility] on [address]. Please advise patient if he will do this.   Initial call taken by: Vedia Coffer,  August 23, 2010 2:44 PM  Follow-up for Phone Call        Spoke with patient-she is complaining of cough non productive,COB, chest hurts, and hol/cold feelings. Doesnt want to come out in this weather and would like something called into pharmacy. Pt aware that CDY is out of office til late this afternoon and will answer then.    ALLERGIES: BIANXIN, ERYTHROMYCIN,CEFTIN,KEFLEX,DOXYCYCLINE, TETRACYCLINE.   [name] CMA  August 23, 2010 2:54 PM   Additional Follow-up for Phone Call Additional follow up Details #1::        Offer augmentin 875 mg, # 14    1 two times a day, no ref Additional Follow-up by: Waymon Budge MD,  August 23, 2010 4:45 PM    Additional Follow-up for Phone Call Additional follow up Details #2::    called and spoke with pt and she is aware of rx being sent. pt had no questions Carver Fila  August 23, 2010 4:50 PM   New/Updated Medications: AUGMENTIN 875-125 MG TABS (AMOXICILLIN-POT CLAVULANATE) 1 tablet two times a day Prescriptions: AUGMENTIN 875-125 MG TABS (AMOXICILLIN-POT CLAVULANATE) 1 tablet two times a day  #14 x 0   Entered by:   Carver Fila   Authorized by:   Waymon Budge MD   Signed by:   Carver Fila on 08/23/2010   Method used:   Electronically to        [facility]  [address] # [id number]* (retail)       [address]       [place], [place]  [address]       Ph: [phone] or [phone]       Fax: [phone]   RxID:   [id number]

## 2010-09-14 NOTE — Assessment & Plan Note (Signed)
Summary: rov 4 months ///kp   Primary Provider/Referring Provider:  wendy McNeil/ [facility]  CC:  4 month follow up.  Pt c/o chest congestion, prod cough with yellow mucus, chest tightness, and and increased SOB with activity x 1 wk.  Denies wheezing and f/c/s.  Marland Kitchen  History of Present Illness:  04/22/09- Chronic respiratory failure, asthma, allergic rhinitis, obesity/hypoventilation Exposed to a sick family member. Now similar illness began as a cold about a week ago, then chest congestion, fever, loose stools, malaise. sputum green/ yellow. GI has now settled down..   June 28, 2009- Chronic respiratory failure, asthma, allergic rhinitis, obesity/hypoventilation Some chest congestion and nasal drip, not feeling energetic and has lying around. Denies fever, sore throat, chills or purulent. This began 4 days ago with queasy stomach and some nausea. Blames weather for headache today. Increased cough and some tussive soreness intermittently right chest.  July 29, 2009- Chronic respiratory failure, asthma, allergic rhinitis, obesity hypoventilation Has been dealing with sick husband. cough yellow x 1 week. Rash x 2 weeks- back,sides, abdomen, pruritic. Using lotion to moisturize. Had flu vax.  December 01, 2009- Chronic respiratory failure, asthma, allergic rhinitis, obesity/hypoventilation She blames pollen for increased chest congestion, porductive cogh/ yellow green, exertional dyspnea. Uncomfortable so not sleeping as well. Oxygen helps. Denies fever. Vaguely uncomfortable across anterior chest. She is told she snorts in her sleep. We discussed possible sleep apnea, which I explained. She thinks oxygen has helped. She will have her husband pay attention to how much this is happening.   Current Medications (verified): 1)  Mucinex Dm 30-600 Mg  Tb12 (Dextromethorphan-Guaifenesin) .... 2 Every 12 Hours 2)  Prilosec Otc 20 Mg  Tbec (Omeprazole Magnesium) .... Take One 30-60 Min Before  First and Last Meals of The Day 3)  Synthroid 25 Mcg  Tabs (Levothyroxine Sodium) .... Take One Tab By Mouth Once Daily Altermating With 1.5 Tab By Mouth Once Daily 4)  Singulair 10 Mg  Tabs (Montelukast Sodium) .... Take One By Mouth Once Daily 5)  Zyrtec Allergy 10 Mg  Tabs (Cetirizine Hcl) .... Once Daily 6)  Flexeril 10 Mg  Tabs (Cyclobenzaprine Hcl) .... Once Daily 7)  Multi Vitamins .... Once Daily 8)  Boniva 150 Mg  Tabs (Ibandronate Sodium) .... Take Once A Month 9)  Ventolin Hfa 108 (90 Base) Mcg/act Aers (Albuterol Sulfate) .... 2 Puffs Qid As Needed 10)  Symbicort 160-4.5 Mcg/act  Aero (Budesonide-Formoterol Fumarate) .... Take 2 Puffs Two Times A Day 11)  Nitroquick 0.4 Mg  Subl (Nitroglycerin) .... As Needed 12)  Plavix 75 Mg  Tabs (Clopidogrel Bisulfate) .... Take 1 By Mouth Once Daily 13)  Astelin 137 Mcg/spray  Soln (Azelastine Hcl) .... Take 2 Sprays Each Nostril Once Daily 14)  Imdur 30 Mg  Tb24 (Isosorbide Mononitrate) .Marland Kitchen.. 1 Daily 15)  Lasix 40 Mg  Tabs (Furosemide) .Marland Kitchen.. 1 Extra By Mouth Once Daily As Needed Leg Swelling 16)  Metformin Hcl 1000 Mg  Tabs (Metformin Hcl) .... Take 1 By Mouth Two Times A Day 17)  Sorine 80 Mg  Tabs (Sotalol Hcl) .... Take 1 By Mouth Two Times A Day 18)  Lantus 100 Unit/ml  Soln (Insulin Glargine) .... Inject 25 Units At Bedtime 19)  Glimepiride 4 Mg  Tabs (Glimepiride) .... Take 1 By Mouth Once Daily 20)  Ditropan Xl 10 Mg Xr24h-Tab (Oxybutynin Chloride) .... Take One By Mouth Once Daily 21)  Prenatal Vitamin .... Take 1 By Mouth Once Daily 22)  Oxygen 1.5l/m .Marland KitchenMarland KitchenMarland Kitchen  1.5 Liters Sitting, 2lpm Walking  Allergies (verified): 1)  ! Biaxin 2)  ! Erythromycin 3)  ! Ceftin 4)  ! Keflex 5)  ! Doxycycline 6)  ! Tetracycline  Past History:  Past Medical History: Last updated: 11/07/2007 CAD/ stent PERIPHERAL EDEMA (ICD-782.3) ENDOGENOUS OBESITY (ICD-259.9) DM (ICD-250.00) ALLERGIC RHINITIS (ICD-477.9) DYSPNEA (ICD-786.09) ACID REFLUX  DISEASE (ICD-530.81) ASTHMA, INTRINSIC, WITH ACUTE EXACERBATION (ICD-493.12) Lung nodule in lingula  Past Surgical History: Last updated: 03/30/2008 Lumbar spine surgery Cholecystectomy hemorrhoidss Oophorectomy for ? cancer- remote Repair wound fistula Right shoulder Elbow  D&C  Family History: Last updated: 2008/04/16 mother died age 62 from pneumonia, heart, allergies father died age 36 from heart attack brother alive age 32; heart-blood clots brother alive age 48; blockage in leg-bypass sister alive age 38;heart, DM, mild stroke.   DM-Grandfather, brother, sister Emphysema-Mother Allergies-Mother Heart Disease-Mother, Father, Brother Clotting Disorder- Brother Arthritis-Mother, Sister Cancer- Aunt (breast and bone)  Social History: Last updated: 06/28/2009 Patient states former smoker. -quit in 1980 married- Husband has cancer esophagus 2 children Positive history of passive tobacco smoke exposure.  Exercises-3/week Caffeine-2 cups daily  Risk Factors: Smoking Status: quit (11/07/2007) Passive Smoke Exposure: yes (2008/04/16)  Review of Systems      See HPI       The patient complains of dyspnea on exertion and prolonged cough.  The patient denies anorexia, fever, weight loss, weight gain, vision loss, decreased hearing, hoarseness, chest pain, syncope, peripheral edema, headaches, hemoptysis, and severe indigestion/heartburn.    Vital Signs:  Patient profile:   77 year old female Height:      61 inches Weight:      219.38 pounds BMI:     41.60 O2 Sat:      95 % on 2 L/minpulsed Pulse rate:   67 / minute BP sitting:   108 / 68  (right arm) Cuff size:   regular  Vitals Entered By: Gweneth Dimitri RN (December 01, 2009 2:48 PM)  O2 Flow:  2 L/minpulsed CC: 4 month follow up.  Pt c/o chest congestion, prod cough with yellow mucus, chest tightness, and increased SOB with activity x 1 wk.  Denies wheezing and f/c/s.   Comments Medications reviewed with  patient Daytime contact number verified with patient. Gweneth Dimitri RN  December 01, 2009 2:48 PM    Physical Exam  Additional Exam:  General: A/Ox3; pleasant and cooperative, NAD,  morbidly obese, supplemental oxygen 95% 2 L/M. Weight down 6 lbs SKIN: no rash, lesions- skin is dry and mildly excoriated NODES: no lymphadenopathy HEENT: [place]/AT, EOM- WNL, Conjuctivae- clear, PERRLA, TM-WNL, Nose- clear, Throat- red without exudate, Mallampati III-IV, tonsils NECK: Supple w/ fair ROM, JVD- none, normal carotid impulses w/o bruits Thyroid- normal to palpation CHEST:Deep cough again noted, not wheezing HEART: RRR, no m/g/r heard ABDOMEN:  ZOX:WRUE, nl pulses, no edema, varices NEURO: Grossly intact to observation      Impression & Recommendations:  Problem # 1:  TRACHEOBRONCHITIS (ICD-490)  Probably seasonal allergy exacerbation. She agrees to neb and depo. The following medications were removed from the medication list:    Amoxicillin 500 Mg Caps (Amoxicillin) .Marland Kitchen... Take one tablet by mouth three times a day until gone    Augmentin 875-125 Mg Tabs (Amoxicillin-pot clavulanate) .Marland Kitchen... 1 two times a day Her updated medication list for this problem includes:    Mucinex Dm 30-600 Mg Tb12 (Dextromethorphan-guaifenesin) .Marland Kitchen... 2 every 12 hours    Singulair 10 Mg Tabs (Montelukast sodium) .Marland Kitchen... Take one by mouth once  daily    Ventolin Hfa 108 (90 Base) Mcg/act Aers (Albuterol sulfate) .Marland Kitchen... 2 puffs qid as needed    Symbicort 160-4.5 Mcg/act Aero (Budesonide-formoterol fumarate) .Marland Kitchen... Take 2 puffs two times a day  Problem # 2:  OBESITY HYPOVENTILATION SYNDROME (ICD-278.8) She continues oxygen. I hope to motivate her to lose weight. She is going to get her husband's help looking for evidence of sleep apnea.  Problem # 3:  LUNG NODULE (ICD-518.89)  She last had CT in 2009. We will get CXR.  Medications Added to Medication List This Visit: 1)  Prenatal Vitamin  .... Take 1 by mouth once  daily  Other Orders: Est. Patient Level III (69629) T-2 View CXR (71020TC) Depo- Medrol 80mg  (J1040) Admin of Therapeutic Inj  intramuscular or subcutaneous (52841) Nebulizer Tx (32440)  Patient Instructions: 1)  Please schedule a follow-up appointment in 4 months. 2)  Ask your husband to help watch for loud snoring, breath-holding in sleep that may be evidence of sleep apnea. We can get a sleep study to settle the issue.Marland Kitchen 3)  Neb xop 1.25 4)  depo 80 5)  A chest x-ray has been recommended.  Your imaging study may require preauthorization.  Prescriptions: ASTELIN 137 MCG/SPRAY  SOLN (AZELASTINE HCL) Take 2 sprays each nostril once daily  #3 x 3   Entered and Authorized by:   Waymon Budge MD   Signed by:   Waymon Budge MD on 12/01/2009   Method used:   Print then Give to Patient   RxID:   [id number] SYMBICORT 160-4.5 MCG/ACT  AERO (BUDESONIDE-FORMOTEROL FUMARATE) take 2 puffs two times a day  #3 x 3   Entered and Authorized by:   Waymon Budge MD   Signed by:   Waymon Budge MD on 12/01/2009   Method used:   Print then Give to Patient   RxID:   [id number] VENTOLIN HFA 108 (90 BASE) MCG/ACT AERS (ALBUTEROL SULFATE) 2 puffs qid as needed  #3 x 3   Entered and Authorized by:   Waymon Budge MD   Signed by:   Waymon Budge MD on 12/01/2009   Method used:   Print then Give to Patient   RxID:   [id number]    Medication Administration  Injection # 1:    Medication: Depo- Medrol 80mg     Diagnosis: ASTHMA, INTRINSIC, WITH ACUTE EXACERBATION (XNA-355.73)    Route: IM    Site: LUOQ gluteus    Exp Date: 06/2012    Lot #: [id number]    Mfr: [employer]    Patient tolerated injection without complications    Given by: Gweneth Dimitri RN (December 01, 2009 3:23 PM)  Orders Added: 1)  Est. Patient Level III [42706] 2)  T-2 View CXR [71020TC] 3)  Depo- Medrol 80mg  [J1040] 4)  Admin of Therapeutic Inj  intramuscular or subcutaneous [96372] 5)  Nebulizer Tx  [23762]

## 2010-09-14 NOTE — Assessment & Plan Note (Signed)
Summary: 3 MONTH ROV.SL   Visit Type:  Follow-up Referring Provider:  Dr Eldridge Dace Primary Provider:  Toniann Fail McNeil/ [facility]   History of Present Illness: [name] is a pleasant 77 yo female with a h/o morbid obesity, COPD (o2 dependant), obesity/hypoventilatory syndrome and paroxysmal atrial fibrillation who  presents today for EP follow-up.  She reports significant improvement in fatigue with recently initiated CPAP.    She continues to have afib once or twice a month, though episodes typically terminate within 5 minutes.   She feels that her afib has significantly improved with low dose cardizem.  She has a h/o chronic lung disease for which is followed by Dr Maple Hudson.  She denies CP, presyncope, syncope, or other concerns.  She has had several falls recently due to unsteadiness and does not take coumadin.  Current Medications (verified): 1)  Mucinex Dm 30-600 Mg  Tb12 (Dextromethorphan-Guaifenesin) .... As Needed 2)  Prilosec Otc 20 Mg  Tbec (Omeprazole Magnesium) .... Take One 30-60 Min Before First and Last Meals of The Day 3)  Synthroid 25 Mcg  Tabs (Levothyroxine Sodium) .... Take One Tab By Mouth Once Daily Altermating With 1.5 Tab By Mouth Once Daily 4)  Singulair 10 Mg  Tabs (Montelukast Sodium) .... Take One By Mouth Once Daily 5)  Zyrtec Allergy 10 Mg  Tabs (Cetirizine Hcl) .... Once Daily 6)  Flexeril 10 Mg  Tabs (Cyclobenzaprine Hcl) .... Once Daily 7)  Multi Vitamins .... Once Daily 8)  Boniva 150 Mg  Tabs (Ibandronate Sodium) .... Take Once A Month 9)  Ventolin Hfa 108 (90 Base) Mcg/act Aers (Albuterol Sulfate) .... 2 Puffs Qid As Needed 10)  Symbicort 160-4.5 Mcg/act  Aero (Budesonide-Formoterol Fumarate) .... As Needed 11)  Nitroquick 0.4 Mg  Subl (Nitroglycerin) .... As Needed 12)  Plavix 75 Mg  Tabs (Clopidogrel Bisulfate) .... Take 1 By Mouth Once Daily 13)  Astelin 137 Mcg/spray  Soln (Azelastine Hcl) .... Take 2 Sprays Each Nostril Once Daily 14)  Imdur 30 Mg  Tb24  (Isosorbide Mononitrate) .Marland Kitchen.. 1 Daily 15)  Lasix 40 Mg  Tabs (Furosemide) .... Two Times A Day 16)  Metformin Hcl 1000 Mg  Tabs (Metformin Hcl) .... Take 1 By Mouth Two Times A Day 17)  Sorine 80 Mg  Tabs (Sotalol Hcl) .... Take 1 1/2  By Mouth Two Times A Day 18)  Lantus 100 Unit/ml  Soln (Insulin Glargine) .... Inject 28 Units At Bedtime 19)  Glimepiride 4 Mg  Tabs (Glimepiride) .... Take 1 By Mouth Once Daily 20)  Ditropan Xl 10 Mg Xr24h-Tab (Oxybutynin Chloride) .... Take One By Mouth Once Daily 21)  Prenatal Vitamin .... Take 1 By Mouth Once Daily 22)  Oxygen 1.5l/m .... 1.5 Liters Sitting, 2lpm Walking 23)  Cpap Autotitration For Pressure Rec. .... Oxygen 2 L/m During Sleep 24)  Diltiazem Hcl 120 Mg Tabs (Diltiazem Hcl) .Marland Kitchen.. 1 By Mouth Daily  Allergies: 1)  ! Biaxin 2)  ! Erythromycin 3)  ! Ceftin 4)  ! Keflex 5)  ! Doxycycline 6)  ! Tetracycline  Past History:  Past Medical History: Reviewed history from 04/04/2010 and no changes required. Coronary artery disease status post drug-eluted stent in October,  2008, by Dr. Eldridge Dace.  History of renal artery stenosis status post stent placement  November, 2008.  CHF  PAROXYSMAL ATRIAL FIBRILLATION BRADYCARDIA HYPERTENSION  RENAL ARTERY STENOSIS  COPD OBESITY HYPOVENTILATION SYNDROME  Obstructive Sleep Apnea- NPSG 04/03/00- AHI 28/hr PRURITUS TRACHEOBRONCHITIS RHINOSINUSITIS, ACUTE ANEMIA HYPOXEMIA PERIPHERAL EDEMA  ENDOGENOUS OBESITY DM  ALLERGIC RHINITIS  ACID REFLUX DISEASE GENERALIZED OSTEOARTHROSIS UNSPECIFIED SITE  HYPOTHYROIDISM Lung nodule in lingula  Past Surgical History: Reviewed history from 02/09/2010 and no changes required. Lumbar spine surgery Cholecystectomy hemorrhoidss Oophorectomy for ? cancer- remote Repair wound fistula Right shoulder Elbow  D&C History of ovarian cancer status post surgery.      Social History: Reviewed history from 02/10/2010 and no changes required. Patient states  former smoker. -quit in 1980 married- Husband has cancer esophagus 2 children  Exercises-3/week Caffeine-2 cups daily  Review of Systems       All systems are reviewed and negative except as listed in the HPI.   Vital Signs:  Patient profile:   77 year old female Height:      61 inches Weight:      217 pounds BMI:     41.15 Pulse rate:   62 / minute BP sitting:   150 / 62  (left arm)  Vitals Entered By: [name] CMA (May 17, 2010 3:03 PM)  Physical Exam  General:  morbidly obese, chronically ill, NAD wearing O2 Head:  normocephalic and atraumatic Eyes:  PERRLA/EOM intact; conjunctiva and lids normal. Mouth:  Teeth, gums and palate normal. Oral mucosa normal. Neck:  supple, JVP 9cm Lungs:  CTAB Heart:  regular rate and rhythm, S1, S2 without murmurs, rubs, gallops, or clicks Abdomen:  Bowel sounds positive; abdomen soft and non-tender without masses, organomegaly, or hernias noted. No hepatosplenomegaly. Msk:  Back normal, normal gait. Muscle strength and tone normal. Pulses:  pulses normal in all 4 extremities Extremities:  No clubbing or cyanosis. Neurologic:  Alert and oriented x 3.   EKG  Procedure date:  05/17/2010  Findings:      sinus rhythm 62 bpm, PR 150, QT 489, otherwise normal ekg  Impression & Recommendations:  Problem # 1:  ATRIAL FIBRILLATION (ICD-427.31) The patient has symptomatic paroxysmal atrial fibrillation which has recently improved.  She could potentionally have her sotalol increased to 160mg  two times a day if needed.  Given the patient's morbid obesity and chronic severe lung disaese with O2 dependance, she is a poor candidate for ablation.  In addition, given her lung disease and LA enlargement on prior echo, I suspect anticipate that success rates from ablation would be very low.  I have therefore recommended ongoing medical therapy at this time.  Her CHADS2 score is elevated.  I would therefore recommend either pradaxa or coumadin for  stroke prevention longterm.  She would like to avoid coumadin but would be willing to consider pradaxa.  She wishes to discuss this further with Dr Eldridge Dace.  At this point, we will continue her current medicine regimen. She will follow-up with Dr Eldridge Dace and I will see her as needed   Problem # 2:  HYPERTENSION (ICD-401.9) stable salt restriction  Problem # 3:  CAD (ICD-414.00) given afib, would consider stopping ASA/Plavix and starting pradaxa or coumadin  Problem # 4:  OBSTRUCTIVE SLEEP APNEA (ICD-327.23) CPAP encouraged  Problem # 5:  OBESITY HYPOVENTILATION SYNDROME (ICD-278.8) weight loss advised  Other Orders: EKG w/ Interpretation (93000)  Patient Instructions: 1)  Your physician recommends that you schedule a follow-up appointment as needed . 2)  Your physician recommends that you continue on your current medications as directed. Please refer to the Current Medication list given to you today.

## 2010-09-14 NOTE — Letter (Signed)
Summary: [name] - [name] - Echo   Imported By: Marylou Mccoy 07/03/2010 11:24:09  _____________________________________________________________________  External Attachment:    Type:   Image     Comment:   External Document

## 2010-09-14 NOTE — Assessment & Plan Note (Signed)
Summary: 4 months/apc   Copy to:  Dr Eldridge Dace Primary Provider/Referring Provider:  Toniann Fail McNeil/ [facility]  CC:  4 month follow up visit-COPD and asthma-cough-producive with yellow phlegm.since Saturday.Marland Kitchen  History of Present Illness: June 28, 2009- Chronic respiratory failure, asthma, allergic rhinitis, obesity/hypoventilation Some chest congestion and nasal drip, not feeling energetic and has lying around. Denies fever, sore throat, chills or purulent. This began 4 days ago with queasy stomach and some nausea. Blames weather for headache today. Increased cough and some tussive soreness intermittently right chest.  July 29, 2009- Chronic respiratory failure, asthma, allergic rhinitis, obesity hypoventilation Has been dealing with sick husband. cough yellow x 1 week. Rash x 2 weeks- back,sides, abdomen, pruritic. Using lotion to moisturize. Had flu vax.  December 01, 2009- Chronic respiratory failure, asthma, allergic rhinitis, obesity/hypoventilation She blames pollen for increased chest congestion, porductive cogh/ yellow green, exertional dyspnea. Uncomfortable so not sleeping as well. Oxygen helps. Denies fever. Vaguely uncomfortable across anterior chest. She is told she snorts in her sleep. We discussed possible sleep apnea, which I explained. She thinks oxygen has helped. She will have her husband pay attention to how much this is happening.  April 04, 2010- Chronic respiratory failure, asthma, allergic rhinitis, obesity/ hypoventilation Had sleep study last night for Dr Johney Frame with results pending.  ADDEND- NPSG moderate obstructive sleep apnea, AHI 28/hr. Done wearing O2 2L. Insufficient sleep to pernit CPAP titration protocol. Noting more cough x 2-3 days with yellow sputum. She had been out of town and may have been exposed to a cold. Discussed antibiotic tolerance- she wil do ok with doxy with a probiotic. Continuous oxygen 1.5 at rest, 2L with exertion. Uses the rescue  inhaler only with colds.    Asthma History    Initial Asthma Severity Rating:    Age range: 12+ years    Symptoms: 0-2 days/week    Nighttime Awakenings: 0-2/month    Interferes w/ normal activity: no limitations    SABA use (not for EIB): 0-2 days/week    Asthma Severity Assessment: Intermittent   Preventive Screening-Counseling & Management  Alcohol-Tobacco     Smoking Status: quit     Year Quit: 1980     Pack years: 10-15     Passive Smoke Exposure: yes     Tobacco Counseling: not to resume use of tobacco products     Passive Smoke Counseling: to avoid passive smoke exposure  Current Medications (verified): 1)  Mucinex Dm 30-600 Mg  Tb12 (Dextromethorphan-Guaifenesin) .... 2 Every 12 Hours 2)  Prilosec Otc 20 Mg  Tbec (Omeprazole Magnesium) .... Take One 30-60 Min Before First and Last Meals of The Day 3)  Synthroid 25 Mcg  Tabs (Levothyroxine Sodium) .... Take One Tab By Mouth Once Daily Altermating With 1.5 Tab By Mouth Once Daily 4)  Singulair 10 Mg  Tabs (Montelukast Sodium) .... Take One By Mouth Once Daily 5)  Zyrtec Allergy 10 Mg  Tabs (Cetirizine Hcl) .... Once Daily 6)  Flexeril 10 Mg  Tabs (Cyclobenzaprine Hcl) .... Once Daily 7)  Multi Vitamins .... Once Daily 8)  Boniva 150 Mg  Tabs (Ibandronate Sodium) .... Take Once A Month 9)  Ventolin Hfa 108 (90 Base) Mcg/act Aers (Albuterol Sulfate) .... 2 Puffs Qid As Needed 10)  Symbicort 160-4.5 Mcg/act  Aero (Budesonide-Formoterol Fumarate) .... Take 2 Puffs Two Times A Day 11)  Nitroquick 0.4 Mg  Subl (Nitroglycerin) .... As Needed 12)  Plavix 75 Mg  Tabs (Clopidogrel Bisulfate) .... Take 1  By Mouth Once Daily 13)  Astelin 137 Mcg/spray  Soln (Azelastine Hcl) .... Take 2 Sprays Each Nostril Once Daily 14)  Imdur 30 Mg  Tb24 (Isosorbide Mononitrate) .Marland Kitchen.. 1 Daily 15)  Lasix 40 Mg  Tabs (Furosemide) .Marland Kitchen.. 1 Extra By Mouth Once Daily As Needed Leg Swelling 16)  Metformin Hcl 1000 Mg  Tabs (Metformin Hcl) .... Take 1 By  Mouth Two Times A Day 17)  Sorine 80 Mg  Tabs (Sotalol Hcl) .... Take 1 By Mouth Two Times A Day 18)  Lantus 100 Unit/ml  Soln (Insulin Glargine) .... Inject 25 Units At Bedtime 19)  Glimepiride 4 Mg  Tabs (Glimepiride) .... Take 1 By Mouth Once Daily 20)  Ditropan Xl 10 Mg Xr24h-Tab (Oxybutynin Chloride) .... Take One By Mouth Once Daily 21)  Prenatal Vitamin .... Take 1 By Mouth Once Daily 22)  Oxygen 1.5l/m .... 1.5 Liters Sitting, 2lpm Walking 23)  Diltiazem Hcl 120 Mg Tabs (Diltiazem Hcl) .Marland Kitchen.. 1 By Mouth Daily  Allergies (verified): 1)  ! Biaxin 2)  ! Erythromycin 3)  ! Ceftin 4)  ! Keflex 5)  ! Doxycycline 6)  ! Tetracycline  Past History:  Past Surgical History: Last updated: 02/09/2010 Lumbar spine surgery Cholecystectomy hemorrhoidss Oophorectomy for ? cancer- remote Repair wound fistula Right shoulder Elbow  D&C History of ovarian cancer status post surgery.      Family History: Last updated: Apr 28, 2008 mother died age 58 from pneumonia, heart, allergies father died age 69 from heart attack brother alive age 60; heart-blood clots brother alive age 71; blockage in leg-bypass sister alive age 16;heart, DM, mild stroke.   DM-Grandfather, brother, sister Emphysema-Mother Allergies-Mother Heart Disease-Mother, Father, Brother Clotting Disorder- Brother Arthritis-Mother, Sister Cancer- Aunt (breast and bone)  Social History: Last updated: 02/10/2010 Patient states former smoker. -quit in 54 married- Husband has cancer esophagus 2 children  Exercises-3/week Caffeine-2 cups daily  Risk Factors: Smoking Status: quit (04/04/2010) Passive Smoke Exposure: yes (04/04/2010)  Past Medical History: Coronary artery disease status post drug-eluted stent in October,  2008, by Dr. Eldridge Dace.  History of renal artery stenosis status post stent placement  November, 2008.  CHF  PAROXYSMAL ATRIAL FIBRILLATION BRADYCARDIA HYPERTENSION  RENAL ARTERY STENOSIS    COPD OBESITY HYPOVENTILATION SYNDROME  Obstructive Sleep Apnea- NPSG 04/03/00- AHI 28/hr PRURITUS TRACHEOBRONCHITIS RHINOSINUSITIS, ACUTE ANEMIA HYPOXEMIA PERIPHERAL EDEMA ENDOGENOUS OBESITY DM  ALLERGIC RHINITIS  ACID REFLUX DISEASE GENERALIZED OSTEOARTHROSIS UNSPECIFIED SITE  HYPOTHYROIDISM Lung nodule in lingula  Review of Systems      See HPI       The patient complains of shortness of breath with activity and productive cough.  The patient denies shortness of breath at rest, non-productive cough, coughing up blood, chest pain, irregular heartbeats, acid heartburn, indigestion, loss of appetite, weight change, abdominal pain, difficulty swallowing, sore throat, tooth/dental problems, headaches, nasal congestion/difficulty breathing through nose, and sneezing.    Vital Signs:  Patient profile:   77 year old female Height:      61 inches Weight:      216.25 pounds BMI:     41.01 O2 Sat:      92 % on 2 L/min Pulse rate:   56 / minute BP sitting:   138 / 58  (left arm) Cuff size:   large  Vitals Entered By: [name] CMA (April 04, 2010 1:52 PM)  O2 Flow:  2 L/min CC: 4 month follow up visit-COPD, asthma-cough-producive with yellow phlegm.since Saturday.   Physical Exam  Additional Exam:  General: A/Ox3; pleasant and cooperative, NAD,  morbidly obese, supplemental oxygen 92% 2 L/M. O2 sat on arrival was 83% on 2 L walking from waiting room. SKIN: no rash, lesions NODES: no lymphadenopathy HEENT: /AT, EOM- WNL, Conjuctivae- clear, PERRLA, TM-WNL, Nose- clear, Throat- red without exudate, Mallampati III-IV, tonsils NECK: Supple w/ fair ROM, JVD- none, normal carotid impulses w/o bruits Thyroid- normal to palpation CHEST:Deep cough again noted, not wheezing HEART: RRR, no m/g/r heard ABDOMEN:  ZOX:WRUE, nl pulses, no edema, varices NEURO: Grossly intact to observation      Impression & Recommendations:  Problem # 1:  TRACHEOBRONCHITIS (ICD-490)  Acute  bronchitis, probably initially viral. We will let her try doxycycline with a probiotic. Her updated medication list for this problem includes:    Mucinex Dm 30-600 Mg Tb12 (Dextromethorphan-guaifenesin) .Marland Kitchen... 2 every 12 hours    Singulair 10 Mg Tabs (Montelukast sodium) .Marland Kitchen... Take one by mouth once daily    Ventolin Hfa 108 (90 Base) Mcg/act Aers (Albuterol sulfate) .Marland Kitchen... 2 puffs qid as needed    Symbicort 160-4.5 Mcg/act Aero (Budesonide-formoterol fumarate) .Marland Kitchen... Take 2 puffs two times a day    Doxycycline Hyclate 100 Mg Caps (Doxycycline hyclate) .Marland Kitchen... 2 today then one daily  Problem # 2:  DYSPNEA (ICD-786.09) Chronic hypoxic respiratory failure. Components of bronchitis, obesity/ hypoventilation, deconditioning and cardiac limitiation Her updated medication list for this problem includes:    Singulair 10 Mg Tabs (Montelukast sodium) .Marland Kitchen... Take one by mouth once daily    Ventolin Hfa 108 (90 Base) Mcg/act Aers (Albuterol sulfate) .Marland Kitchen... 2 puffs qid as needed    Symbicort 160-4.5 Mcg/act Aero (Budesonide-formoterol fumarate) .Marland Kitchen... Take 2 puffs two times a day    Lasix 40 Mg Tabs (Furosemide) .Marland Kitchen... 1 extra by mouth once daily as needed leg swelling    Sorine 80 Mg Tabs (Sotalol hcl) .Marland Kitchen... Take 1 by mouth two times a day  Problem # 3:  ENDOGENOUS OBESITY (ICD-259.9) She has lost 10 lbs in past year and more would help.  Problem # 4:  OBSTRUCTIVE SLEEP APNEA (ICD-327.23)  She was finally willing to have a sleep study and it confirmed moderate obstructive sleep apnea. While she continues to work on her weight, we will have her try CPAP. We will arrange an autotitration for pressure recommendation. Oxygen will continue, bled in through CPAP at night at 2 L.  Medications Added to Medication List This Visit: 1)  Cpap Autotitration For Pressure Rec.  .... Oxygen 2 l/m during sleep 2)  Doxycycline Hyclate 100 Mg Caps (Doxycycline hyclate) .... 2 today then one daily  Other Orders: Est. Patient  Level III (45409) Est. Patient Level IV (81191) DME Referral (DME)  Patient Instructions: 1)  Please schedule a follow-up appointment in 4 months. 2)  Script for doxycycline sent to drug store.  3)  It should help your stomach while on this antibiotic to take a probiotic like Align or Activia Yogurt. Try taking it a few hours after the antibiotic each day. 4)  Starting CPAP for sleep apnea 5)  cc Dr Selena Batten, Dr Hillis Range Prescriptions: DOXYCYCLINE HYCLATE 100 MG CAPS (DOXYCYCLINE HYCLATE) 2 today then one daily  #8 x 0   Entered and Authorized by:   Waymon Budge MD   Signed by:   Waymon Budge MD on 04/04/2010   Method used:   Electronically to        [facility] # [id number]* (retail)  [address]       [place], [place]  [address]       Ph: [phone] or [phone]       Fax: [phone]   RxID:   [id number]

## 2010-09-20 NOTE — Assessment & Plan Note (Signed)
Summary: 3 month   Vital Signs:  Patient profile:   77 year old female Height:      61 inches Weight:      214.25 pounds BMI:     40.63 O2 Sat:      100 % on 1.5 L/min Pulse rate:   56 / minute BP sitting:   122 / 70  (left arm) Cuff size:   regular  Vitals Entered By: [name] CMA (September 14, 2010 2:14 PM)  O2 Flow:  1.5 L/min   Copy to:  Dr Eldridge Dace Primary Provider/Referring Provider:  Toniann Fail McNeil/ [facility]   History of Present Illness:  April 04, 2010- Chronic respiratory failure, asthma, allergic rhinitis, obesity/ hypoventilation Had sleep study last night for Dr Johney Frame with results pending.  ADDEND- NPSG moderate obstructive sleep apnea, AHI 28/hr. Done wearing O2 2L. Insufficient sleep to pernit CPAP titration protocol. Noting more cough x 2-3 days with yellow sputum. She had been out of town and may have been exposed to a cold. Discussed antibiotic tolerance- she wil do ok with doxy with a probiotic. Continuous oxygen 1.5 at rest, 2L with exertion. Uses the rescue inhaler only with colds.  June 15, 2010- Chronic respiratory failure, asthma, allergic rhinitis, obesity/ hypoventilation, PAF Nurse-CC: Acute visit , pt c/o prod cough green, increase sob x 10 days Malaise, increased cough. Described cardiology issues lately- fluid retention, irregular pulse.  Home O2 is continuous 2 L. Arriving here on a pulse regulator, O2 sat was 79%, incr to 94% on our 3L continuous O2. At onset a week ago she may have had some fever, but denies sore throat . Cough is productive white/ green x 1-2 weeks, some brown.  \par September 14, 2010-  Chronic respiratory failure, asthma, allergic rhinitis, obesity/ hypoventilation, PAF Nurse-CC:  CPAP 16 every night with O2 2 hours. It has definitely helped her sleep.  CXR- Mild CE, chronic bronchitis She is feeling well today and pleased with her good oxygen saturation today on O2. She credits benzonatate for loosening phlegm so  she could cough it out. Occasional wheeze, varies day to day. Uses rescue inhaler only if sick. She asks scripts for zyrtec and singulair [place] base.  Recent cardiac cath and stress test- [facility].    Asthma History    Asthma Control Assessment:    Age range: 12+ years    Symptoms: 0-2 days/week    Nighttime Awakenings: 0-2/month    Interferes w/ normal activity: no limitations    SABA use (not for EIB): 0-2 days/week    Asthma Control Assessment: Well Controlled   Preventive Screening-Counseling & Management  Alcohol-Tobacco     Smoking Status: quit     Year Quit: 1980     Pack years: 10-15     Passive Smoke Exposure: yes     Tobacco Counseling: not to resume use of tobacco products     Passive Smoke Counseling: to avoid passive smoke exposure  Current Medications (verified): 1)  Mucinex Dm 30-600 Mg  Tb12 (Dextromethorphan-Guaifenesin) .... As Needed 2)  Prilosec Otc 20 Mg  Tbec (Omeprazole Magnesium) .... Take One 30-60 Min Before First and Last Meals of The Day 3)  Synthroid 50 Mcg Tabs (Levothyroxine Sodium) .... Take 1 /2 By Mouth Mon,wed,fri and 1 By Mouth Tues,thurs,sat,sunday 4)  Singulair 10 Mg  Tabs (Montelukast Sodium) .... Take One By Mouth Once Daily 5)  Zyrtec Allergy 10 Mg  Tabs (Cetirizine Hcl) .... Once Daily 6)  Flexeril 10 Mg  Tabs (Cyclobenzaprine Hcl) .... Once Daily 7)  Multi Vitamins .... Once Daily 8)  Boniva 150 Mg  Tabs (Ibandronate Sodium) .... Take Once A Month 9)  Ventolin Hfa 108 (90 Base) Mcg/act Aers (Albuterol Sulfate) .... 2 Puffs Qid As Needed 10)  Symbicort 160-4.5 Mcg/act  Aero (Budesonide-Formoterol Fumarate) .... As Needed 11)  Nitroquick 0.4 Mg  Subl (Nitroglycerin) .... As Needed 12)  Plavix 75 Mg  Tabs (Clopidogrel Bisulfate) .... Take 1 By Mouth Once Daily 13)  Astelin 137 Mcg/spray  Soln (Azelastine Hcl) .... Take 2 Sprays Each Nostril Once Daily 14)  Imdur 30 Mg  Tb24 (Isosorbide Mononitrate) .Marland Kitchen.. 1 Daily 15)  Lasix 40 Mg  Tabs  (Furosemide) .... Two Times A Day 16)  Metformin Hcl 1000 Mg  Tabs (Metformin Hcl) .... Take 1 By Mouth Two Times A Day 17)  Sorine 80 Mg  Tabs (Sotalol Hcl) .... Take 1 1/2  By Mouth Two Times A Day 18)  Lantus 100 Unit/ml  Soln (Insulin Glargine) .... Inject 28 Units At Bedtime 19)  Glimepiride 4 Mg  Tabs (Glimepiride) .... Take 1 By Mouth Once Daily 20)  Ditropan Xl 10 Mg Xr24h-Tab (Oxybutynin Chloride) .... Take One By Mouth Once Daily 21)  Prenatal Vitamin .... Take 1 By Mouth Once Daily 22)  Oxygen 1.5l/m .... 1.5 Liters Sitting, 2lpm Walking 23)  Cpap 16 Advanced .... Oxygen 2 L/m During Sleep 24)  Diltiazem Hcl 120 Mg Tabs (Diltiazem Hcl) .... 2  Tablets By Mouth Daily 25)  Benzonatate 200 Mg Caps (Benzonatate) .Marland Kitchen.. 1 By Mouth Three Times A Day As Needed For Cough 26)  Vitamin D3 5000 Unit Caps (Cholecalciferol) .... Take 1 By Mouth Three Times A Week  Allergies (verified): 1)  ! Biaxin 2)  ! Erythromycin 3)  ! Ceftin 4)  ! Keflex 5)  ! Doxycycline 6)  ! Tetracycline  Past History:  Past Medical History: Last updated: 04/04/2010 Coronary artery disease status post drug-eluted stent in October,  2008, by Dr. Eldridge Dace.  History of renal artery stenosis status post stent placement  November, 2008.  CHF  PAROXYSMAL ATRIAL FIBRILLATION BRADYCARDIA HYPERTENSION  RENAL ARTERY STENOSIS  COPD OBESITY HYPOVENTILATION SYNDROME  Obstructive Sleep Apnea- NPSG 04/03/00- AHI 28/hr PRURITUS TRACHEOBRONCHITIS RHINOSINUSITIS, ACUTE ANEMIA HYPOXEMIA PERIPHERAL EDEMA ENDOGENOUS OBESITY DM  ALLERGIC RHINITIS  ACID REFLUX DISEASE GENERALIZED OSTEOARTHROSIS UNSPECIFIED SITE  HYPOTHYROIDISM Lung nodule in lingula  Past Surgical History: Last updated: 02/09/2010 Lumbar spine surgery Cholecystectomy hemorrhoidss Oophorectomy for ? cancer- remote Repair wound fistula Right shoulder Elbow  D&C History of ovarian cancer status post surgery.      Family History: Last updated:  05/04/08 mother died age 41 from pneumonia, heart, allergies father died age 36 from heart attack brother alive age 31; heart-blood clots brother alive age 90; blockage in leg-bypass sister alive age 56;heart, DM, mild stroke.   DM-Grandfather, brother, sister Emphysema-Mother Allergies-Mother Heart Disease-Mother, Father, Brother Clotting Disorder- Brother Arthritis-Mother, Sister Cancer- Aunt (breast and bone)  Social History: Last updated: 02/10/2010 Patient states former smoker. -quit in 1980 married- Husband has cancer esophagus 2 children  Exercises-3/week Caffeine-2 cups daily  Risk Factors: Smoking Status: quit (09/14/2010) Passive Smoke Exposure: yes (09/14/2010)  Review of Systems      See HPI       The patient complains of shortness of breath with activity and non-productive cough.  The patient denies shortness of breath at rest, productive cough, coughing up blood, chest pain, irregular heartbeats, acid  heartburn, indigestion, loss of appetite, weight change, abdominal pain, difficulty swallowing, sore throat, tooth/dental problems, headaches, nasal congestion/difficulty breathing through nose, sneezing, anxiety, rash, change in color of mucus, and fever.    Physical Exam  Additional Exam:  General: A/Ox3; pleasant and cooperative, NAD,  morbidly obese, supplemental oxygen 100% 3 L/M.   SKIN: no rash, lesions NODES: no lymphadenopathy HEENT: Cantril/AT, EOM- WNL, Conjuctivae- clear, PERRLA, TM-WNL, Nose- clear, Throat- red without exudate, Mallampati III-IV, tonsils NECK: Supple w/ fair ROM, JVD- none, normal carotid impulses w/o bruits Thyroid- normal to palpation CHEST:Hlight rhonchi in right base, unlabored HEART: RRR, no m/g/r heard. Pulse is currently regular ABDOMEN: obese ZOX:WRUE, nl pulses, no edema, varices NEURO: Grossly intact to observation      Impression & Recommendations:  Problem # 1:  OBSTRUCTIVE SLEEP APNEA (ICD-327.23)  Good control  and compliance. weight loss is encouraged.  Problem # 2:  OBESITY HYPOVENTILATION SYNDROME (ICD-278.8)  She remains a shallow breather, obvious on observation. Until her body build is changed we won't see progress. She does not need noninvasive positive pressure at this time.   Orders: Est. Patient Level IV (45409)  Problem # 3:  TRACHEOBRONCHITIS (ICD-490)  Chronic bronchitis. Ok to keep tessalon on hand and we reviewed her med use. She remains dependent on oxygen and manages it well.  The following medications were removed from the medication list:    Avelox 400 Mg Tabs (Moxifloxacin hcl) .Marland Kitchen... 1 daily x 5 days    Augmentin 875-125 Mg Tabs (Amoxicillin-pot clavulanate) .Marland Kitchen... 1 tablet two times a day Her updated medication list for this problem includes:    Mucinex Dm 30-600 Mg Tb12 (Dextromethorphan-guaifenesin) .Marland Kitchen... As needed    Singulair 10 Mg Tabs (Montelukast sodium) .Marland Kitchen... Take one by mouth once daily    Ventolin Hfa 108 (90 Base) Mcg/act Aers (Albuterol sulfate) .Marland Kitchen... 2 puffs qid as needed    Symbicort 160-4.5 Mcg/act Aero (Budesonide-formoterol fumarate) .Marland Kitchen... As needed    Benzonatate 200 Mg Caps (Benzonatate) .Marland Kitchen... 1 by mouth three times a day as needed for cough  Medications Added to Medication List This Visit: 1)  Synthroid 50 Mcg Tabs (Levothyroxine sodium) .... Take 1 /2 by mouth mon,wed,fri and 1 by mouth tues,thurs,sat,sunday 2)  Vitamin D3 5000 Unit Caps (Cholecalciferol) .... Take 1 by mouth three times a week  Patient Instructions: 1)  Please schedule a follow-up appointment in 4 months. 2)  Script sent for benzonatate 3)  scripts printed for zyrtec and singulair 4)  Continue CPAP and oxygen Prescriptions: ZYRTEC ALLERGY 10 MG  TABS (CETIRIZINE HCL) once daily  #90 x 3   Entered and Authorized by:   Ezel Vallone D Javier Gell MD   Signed by:   Aleatha Taite D Amiri Tritch MD on 09/14/2010   Method used:   Print then Give to Patient   RxID:   [id number] BENZONATATE 200 MG CAPS  (BENZONATATE) 1 by mouth three times a day as needed for cough  #50 x 5   Entered and Authorized by:   Ashleigh Arya D Ronald Londo MD   Signed by:   Blima Jaimes D Zariah Cavendish MD on 09/14/2010   Method used:   Electronically to        [facility]  [address] # [id number]* (retail)       [address]       [place], [place]  [address]       Ph: [phone] or [phone]  Fax: [phone]   RxID:   [id number] SINGULAIR 10 MG  TABS (MONTELUKAST SODIUM) Take one by mouth once daily  #90 x 3   Entered and Authorized by:   Waymon Budge MD   Signed by:   Waymon Budge MD on 09/14/2010   Method used:   Print then Give to Patient   RxID:   [id number]    Orders Added: 1)  Est. Patient Level IV [41324]

## 2010-09-27 NOTE — Procedures (Signed)
NAMESAOIRSE, LEGERE         ACCOUNT NO.:  [id number]  MEDICAL RECORD NO.:  [id number]          PATIENT TYPE:  AMB  LOCATION:  SDS                          FACILITY:  [facility]  PHYSICIAN:  Corky Crafts, MDDATE OF BIRTH:  1934-06-21  DATE OF PROCEDURE:  09/06/2010 DATE OF DISCHARGE:  09/06/2010                           CARDIAC CATHETERIZATION   REFERRING PHYSICIAN:  Pam Drown, MD  PROCEDURES PERFORMED: 1. Left heart catheterization. 2. Coronary angiogram. 3. Bilateral selective renal angiogram. 4. Fractional flow reserve of the right coronary artery.  OPERATOR:  Corky Crafts, MD  INDICATIONS:  Angina, hypertension.  PROCEDURE NOTE:  After the risks and benefits of cardiac catheterization were explained to the patient, informed consent was obtained, she was brought to the cath lab.  She was prepped and draped in the usual sterile fashion.  Her right groin was infiltrated with 1% lidocaine.  A 6-French sheath was placed into the right femoral artery using modified Seldinger technique.  There is some difficulty in navigating a J-wire up the iliacs, so a Wholey wire was used.  A JL-4 catheter was used to engage the left main coronary artery.  Digital angiography was performed in multiple projections using hand injection of contrast.  Right coronary artery angiography was performed using a JR-4.0 catheter in a similar fashion.  The JR-4 catheter was withdrawn to the abdominal aorta and selective right renal angiogram was performed.  Subsequently, the same catheter was advanced to the left renal artery stent but could not successfully engage the vessel, we switched out for a short IMA 6-French catheter to engage the stent in the left renal artery and performed angiography.  A pigtail catheter was then advanced to the ascending aorta and across the aortic valve under fluoroscopic guidance.  A pullback was performed under continuous hemodynamic pressure  monitoring. Subsequently the FFR of the RCA was performed.  A JR-4 guiding catheter was used to engage the right coronary artery.  Heparin was given for anticoagulation.  Adenosine was used to perform the stress portion of the FFR.  The wire was removed.  The sheath will be pulled using manual compression.  FINDINGS:  The left main was widely patent. Left circumflex is a large vessel and is very tortuous in the mid vessel.  There is mild disease at the OM-1.  Just prior to the branch of the OM-1, there is a hazy segment, I think this is tortuosity as there are no other views that showed to be a severe stenosis.  It appears to be about 25% stenosis.  The OM-1 is widely patent.  The OM-2 has a 40% mid lesion. Left anterior descending is widely patent.  The long stented area appears widely patent.  There are two diagonals which are also widely patent.  The second diagonal has mild ostial disease. The right coronary artery is a medium-sized dominant vessel.  In the proximal to midportion, there is moderate stenosis.  There is a segment that appears tubular and about 50%.  The posterolateral artery and PDA are medium-sized vessels and widely patent. The right renal artery is widely patent. Left renal artery has a patent stent. The FFR  of the RCA showed a 0.93 value at rest, after IV adenosine the value dropped to 0.84, this was thought to be nonsignificant.  HEMODYNAMIC RESULTS:  Left ventricular pressure 130/7 with an LVEDP of 15 mmHg.  Aortic pressure 134/49 with a mean aortic pressure of 79 mmHg.  IMPRESSION: 1. Patent left anterior descending stent. 2. Patent left renal stent. 3. Normal left ventricular end-diastolic pressure. 4. Moderate right coronary artery disease but not significant by     fractional flow reserve.  RECOMMENDATIONS:  Continue aggressive medical therapy.  She needs aggressive blood pressure control and particular attempts at weight loss.     Corky Crafts, MD     JSV/MEDQ  D:  09/06/2010  T:  09/07/2010  Job:  [id number]  Electronically Signed by Lance Muss MD on 09/27/2010 09:39:46 AM

## 2010-10-17 ENCOUNTER — Ambulatory Visit: Payer: Medicare Other | Attending: Family Medicine | Admitting: Physical Therapy

## 2010-10-17 DIAGNOSIS — IMO0001 Reserved for inherently not codable concepts without codable children: Secondary | ICD-10-CM | POA: Insufficient documentation

## 2010-10-17 DIAGNOSIS — R5381 Other malaise: Secondary | ICD-10-CM | POA: Insufficient documentation

## 2010-10-17 DIAGNOSIS — R262 Difficulty in walking, not elsewhere classified: Secondary | ICD-10-CM | POA: Insufficient documentation

## 2010-10-17 DIAGNOSIS — R269 Unspecified abnormalities of gait and mobility: Secondary | ICD-10-CM | POA: Insufficient documentation

## 2010-10-24 ENCOUNTER — Ambulatory Visit: Payer: Medicare Other | Admitting: Physical Therapy

## 2010-10-26 ENCOUNTER — Ambulatory Visit: Payer: Medicare Other | Admitting: Physical Therapy

## 2010-11-01 ENCOUNTER — Ambulatory Visit: Payer: Medicare Other | Admitting: Physical Therapy

## 2010-11-02 ENCOUNTER — Ambulatory Visit: Payer: Medicare Other | Admitting: Physical Therapy

## 2010-11-07 ENCOUNTER — Ambulatory Visit: Payer: Medicare Other | Attending: Family Medicine | Admitting: Physical Therapy

## 2010-11-07 DIAGNOSIS — R5381 Other malaise: Secondary | ICD-10-CM | POA: Insufficient documentation

## 2010-11-07 DIAGNOSIS — R262 Difficulty in walking, not elsewhere classified: Secondary | ICD-10-CM | POA: Insufficient documentation

## 2010-11-07 DIAGNOSIS — R269 Unspecified abnormalities of gait and mobility: Secondary | ICD-10-CM | POA: Insufficient documentation

## 2010-11-07 DIAGNOSIS — IMO0001 Reserved for inherently not codable concepts without codable children: Secondary | ICD-10-CM | POA: Insufficient documentation

## 2010-11-09 ENCOUNTER — Ambulatory Visit: Payer: Medicare Other | Admitting: Physical Therapy

## 2010-11-14 ENCOUNTER — Ambulatory Visit: Payer: Medicare Other | Attending: Family Medicine | Admitting: Physical Therapy

## 2010-11-14 DIAGNOSIS — R262 Difficulty in walking, not elsewhere classified: Secondary | ICD-10-CM | POA: Insufficient documentation

## 2010-11-14 DIAGNOSIS — R269 Unspecified abnormalities of gait and mobility: Secondary | ICD-10-CM | POA: Insufficient documentation

## 2010-11-14 DIAGNOSIS — IMO0001 Reserved for inherently not codable concepts without codable children: Secondary | ICD-10-CM | POA: Insufficient documentation

## 2010-11-14 DIAGNOSIS — R5381 Other malaise: Secondary | ICD-10-CM | POA: Insufficient documentation

## 2010-11-16 ENCOUNTER — Ambulatory Visit: Payer: Medicare Other | Admitting: Physical Therapy

## 2010-11-21 ENCOUNTER — Ambulatory Visit: Payer: Medicare Other | Admitting: Physical Therapy

## 2010-11-22 ENCOUNTER — Ambulatory Visit: Payer: Medicare Other | Admitting: Physical Therapy

## 2010-11-27 ENCOUNTER — Encounter: Payer: Self-pay | Admitting: Internal Medicine

## 2010-11-28 ENCOUNTER — Ambulatory Visit: Payer: Medicare Other | Admitting: Physical Therapy

## 2010-11-29 ENCOUNTER — Ambulatory Visit: Payer: Medicare Other | Admitting: Physical Therapy

## 2010-12-05 ENCOUNTER — Ambulatory Visit: Payer: Medicare Other | Admitting: Physical Therapy

## 2010-12-06 ENCOUNTER — Ambulatory Visit: Payer: Medicare Other | Admitting: Physical Therapy

## 2010-12-12 ENCOUNTER — Ambulatory Visit: Payer: Medicare Other | Attending: Family Medicine | Admitting: Physical Therapy

## 2010-12-12 DIAGNOSIS — R262 Difficulty in walking, not elsewhere classified: Secondary | ICD-10-CM | POA: Insufficient documentation

## 2010-12-12 DIAGNOSIS — IMO0001 Reserved for inherently not codable concepts without codable children: Secondary | ICD-10-CM | POA: Insufficient documentation

## 2010-12-12 DIAGNOSIS — R5381 Other malaise: Secondary | ICD-10-CM | POA: Insufficient documentation

## 2010-12-12 DIAGNOSIS — R269 Unspecified abnormalities of gait and mobility: Secondary | ICD-10-CM | POA: Insufficient documentation

## 2010-12-14 ENCOUNTER — Ambulatory Visit: Payer: Medicare Other | Admitting: Physical Therapy

## 2010-12-19 ENCOUNTER — Ambulatory Visit: Payer: Medicare Other | Admitting: Physical Therapy

## 2010-12-21 ENCOUNTER — Ambulatory Visit: Payer: Medicare Other | Admitting: Physical Therapy

## 2010-12-26 ENCOUNTER — Ambulatory Visit: Payer: Medicare Other | Admitting: Physical Therapy

## 2010-12-26 NOTE — Op Note (Signed)
NAMESIBONEY, REQUEJO         ACCOUNT NO.:  [id number]   MEDICAL RECORD NO.:  [id number]          PATIENT TYPE:  INP   LOCATION:  3731                         FACILITY:  [facility]   PHYSICIAN:  Petra Kuba, M.D.    DATE OF BIRTH:  [birth year]   DATE OF PROCEDURE:  08/28/2007  DATE OF DISCHARGE:                               OPERATIVE REPORT   PROCEDURE:  Esophagogastroduodenoscopy.   INDICATIONS:  GI bleeding.  Patient on aspirin and Plavix.  Consent was  signed after risks, benefits, methods, options thoroughly discussed  prior to any sedation given yesterday in the hospital.   MEDICINES USED:  Fentanyl 50 mcg, Versed 3 mg.   PROCEDURE:  Video endoscope was inserted by direct vision.  She did have  a tiny hiatal hernia with some minimal distal esophagitis.  Scope passed  into the stomach.  No obvious blood was seen.  Advanced to the antrum,  pertinent for some mild antritis.  Advanced through a normal pylorus  into a normal duodenal bulb and around the C-loop to a normal second  portion of the duodenum.  No blood was seen distally.  The scope was  slowly withdrawn back to the bulb.  A good look there ruled out  abnormalities in that location.  The scope was withdrawn back to the  stomach and retroflexed.  Angularis, cardia, fundus, lesser and greater  curve were evaluated on retroflexed and then straight visualization.  Other than the hiatal hernia being confirmed in the cardia, some mild  gastritis and a tiny distal greater curve polyp not deemed significant,  no abnormalities were seen.  Air was suctioned, scope slowly withdrawn.  We elected not to biopsy the polyp based on her aspirin and Plavix and  the tiny, insignificant appearance.  On slow withdrawal through the  esophagus there was some minimal distal esophagitis as mentioned above  but no significant findings.  Rest of the esophagus was normal, a quick  look at the vocal cords normal.  The scope was removed.  The  patient  tolerated the procedure well.  There was no obvious immediate  complication.   ENDOSCOPIC DIAGNOSES:  1. Tiny hiatal hernia with minimal distal esophagitis.  2. Minimal antritis, gastritis.  3. No blood seen.  4. Tiny greater curve polyp, not significant, not biopsied.  5. Otherwise normal esophagogastroduodenoscopy without any signs of      bleeding.   PLAN:  Go ahead and retry a colonoscopy tomorrow with further workup and  plans pending those findings.  Continue pump inhibitors.  May want to  upgrade the Prilosec to prescription strength.           ______________________________  Petra Kuba, M.D.     MEM/MEDQ  D:  08/28/2007  T:  08/28/2007  Job:  [id number]   cc:   Kela Millin, M.D.  Pam Drown, M.D.  John C. Madilyn Fireman, M.D.

## 2010-12-26 NOTE — Assessment & Plan Note (Signed)
[facility]                             PULMONARY OFFICE NOTE   [name], [name]                MRN:          [id number]  DATE:03/10/2007                            DOB:          [birth year]    PROBLEM LIST:  1. Allergic rhinitis.  2. Asthmatic bronchitis.  3. Diabetes.  4. 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  5. Peripheral edema.  6. Exogenous obesity.   HISTORY:  Three days catching something, scratchy throat.  She feels  her cervical nodes are tender, no real fever, nothing purulent.  She is  about to travel.  Got very much better quickly from the similar  complaints in mid June, she says, when we gave a breathing treatment and  short course of Omnicef.  She is interested in doing the same thing and  we discussed this.   MEDICATION LIST:  Charted and reviewed.   OBJECTIVE:  Weight 236 pounds, BP 150/74, pulse 71, room air saturation  95%.  There is a little patchy erythema on her hard palate, no exudates, no  significant postnasal drainage, some mild coughing without wheeze.   IMPRESSION:  Possibly a viral upper respiratory infection with  bronchitis.  After some discussion, I agreed to treat her as we had last  time because she has confidence in that approach and she is concerned  about it going out of town.   PLAN:  She is given nebulizer treatment, Xopenex 1.25 mg, Omnicef 300 mg  twice daily for 7 days, schedule return 3 months, earlier p.r.n.  Fluids  and supportive care.     Clinton D. Maple Hudson, MD, Tonny Bollman, FACP  Electronically Signed    CDY/MedQ  DD: 03/10/2007  DT: 03/11/2007  Job #: [id number]

## 2010-12-26 NOTE — Assessment & Plan Note (Signed)
[facility]                             PULMONARY OFFICE NOTE   SARAY, CAPASSO                MRN:          [id number]  DATE:05/26/2007                            DOB:          07/18/1934    HISTORY OF PRESENT ILLNESS:  Patient is a 77 year old white female  patient of Dr. Maple Hudson who has a known history of asthmatic bronchitis and  allergic rhinitis who presents today with a 4-day history of productive  cough, nasal congestion, sore throat, and wheezing.  Patient denies any  hemoptysis, orthopnea, or PND.  Patient has recently been taken off her  Maxzide fluid pill, and has noticed that she has been having increased  ankle edema.   PAST MEDICAL HISTORY:  Reviewed.   CURRENT MEDICATIONS:  Reviewed.   PHYSICAL EXAMINATION:  Patient is a morbidly obese female in no acute  distress.  She is afebrile.  Blood pressure is 128/62.  Her O2 saturation was 96%  at rest.  Walking O2 saturation decreased from 96% to 88% with quick  rebound with rest.  HEENT:  Nasal mucosa is slightly pale.  Nontender sinuses.  Posterior  pharynx is clear.  NECK:  Supple without adenopathy or JVD.  LUNG SOUNDS:  Reveal coarse rhonchi bilaterally with a few expiratory  wheezes.  CARDIAC:  Regular rate.  ABDOMEN:  Soft and nontender.  EXTREMITIES:  Warm without any calf tenderness, cyanosis, clubbing.  There is 1+ edema bilaterally with venous insufficiency changes.   DATA:  Chest x-ray revealed no acute changes.   IMPRESSION AND PLAN:  Acute asthmatic bronchitic exacerbation.  Patient  is to begin Avelox 400 mg x7 days.  Depo-Medrol 120 IM injection was  given today in the office.  Prednisone taper over the next week.  Patient was advised on monitoring blood sugars closely.  She does have  underlying diabetes.  Patient is to contact the office for blood sugars  over 200.  Patient will begin Mucinex DM twice daily.  Xopenex nebulizer  treatment was given in the  office.  Patient was given a prescription for  Lasix 20 mg 1 to 2 daily for the next 3 days as needed for lower  extremity edema.  Patient is recommended to follow back here in 3 days.  Patient is to contact the office for sooner followup if symptoms do not  improve, or worsen.      Rubye Oaks, NP  Electronically Signed      Clinton D. Maple Hudson, MD, Tonny Bollman, FACP  Electronically Signed   TP/MedQ  DD: 05/27/2007  DT: 05/27/2007  Job #: [id number]

## 2010-12-26 NOTE — Consult Note (Signed)
[name], [name]         ACCOUNT NO.:  [id number]   MEDICAL RECORD NO.:  [id number]          PATIENT TYPE:  INP   LOCATION:  3731                         FACILITY:  [facility]   PHYSICIAN:  Bernette Redbird, M.D.   DATE OF BIRTH:  [birth year]   DATE OF CONSULTATION:  09/11/2007  DATE OF DISCHARGE:                                 CONSULTATION   GASTROENTEROLOGY CONSULTATION.   REASON FOR CONSULTATION:  Dr. Adela Glimpse, of the [facility],  asked [name] to see this 77 year old female because of heme-positive stool  and anemia.   HISTORY:  The patient was seen earlier this month, about 2 weeks ago, by  my partner, Dr. Vida Rigger, because of the history of melena.  Dr. Ewing Schlein  performed endoscopy and colonoscopy, the latter exam not being on e-  chart, but the handwritten report on the chart indicates that he reached  the terminal ileum and other than some sigmoid and left-sided  diverticula and some small rectosigmoid polyps (which were adenomatous  and hyperplastic on pathology), no significant abnormalities nor source  of anemia was identified.   The patient has had dark stools prior to admission and has had a  significant drop in hemoglobin down to 7.5 as of that time.  Please see  Dr. Marlane Hatcher dictated consultation note from January 14.   With that background, however, the patient has been persistently heme-  positive and is on aspirin and Plavix and in view of a previous drug-  eluding stent, she may be on those medications for some time.  There was  a concern that her hemoglobin may be falling in the hospital, but in  fact, it has been stable in the range of 8 to 9 since her transfusion 2  weeks ago.  In fact, it actually went up yesterday from 8.9 to 9.5  today.   Symptomatically, the patient is okay.  She had some nausea today but  that is atypical.  No dysphagia and no anorexia, no obvious weight loss,  apart from appropriate fluid loss status post diuresis, no  chronic  constipation, diarrhea or visible rectal bleeding apart from previous  dark stools.   PHYSICAL EXAMINATION:  GENERAL:  An overweight, articulate Caucasian  female who is a little bit pale, no acute distress, anicteric.  CHEST:  Clear.  HEART:  Normal.  ABDOMEN:  Obese but without mass or tenderness.  RECTAL:  By Algis Downs PAC showed mucoid trace heme-positive stool  residue.   LABORATORY STUDIES:  Per above.  The patient was also Hemoccult positive  yesterday and on January 18.  An anemia panel on January 14 showed 14%  iron saturation and a ferritin of 18 and on her initial admitting CBC,  hemoglobin was 7.3 with an MCV of 91 with an elevated RDW of 16.3.  This  is as compared to a baseline hemoglobin of 10.8 back in October.   IMPRESSION:  1. Chronic anemia think that acute and chronic anemia with borderline      iron studies.  2. Repeatedly heme-positive stool with unrevealing endoscopy and      colonoscopy.  3. Status post drug-eluting  stent placement in October 2008, still on      aspirin and Plavix.   DISCUSSION AND PLAN:  I do feel that a small bowel capsule endoscopy is  appropriate in this patient because it will complete her GI workup,  which is relevant in view of the fact she will probably be on aspirin  and Plavix for some time.  Realistically, my experience has been that  most of the time patients in this setting do not have an identifiable  lesion in the small bowel and we will probably be stuck with simply  supporting the patient with iron therapy and hemoglobin monitoring.   We appreciate the opportunity to have seen this patient with you in  consultation.           ______________________________  Bernette Redbird, M.D.     RB/MEDQ  D:  09/11/2007  T:  09/12/2007  Job:  [id number]   cc:   Pam Drown, M.D.  Clinton D. Maple Hudson, MD, FCCP, FACP  Petra Kuba, M.D.  Corky Crafts, MD

## 2010-12-26 NOTE — Cardiovascular Report (Signed)
[name], [name]         ACCOUNT NO.:  [id number]   MEDICAL RECORD NO.:  [id number]          PATIENT TYPE:  INP   LOCATION:  6531                         FACILITY:  [facility]   PHYSICIAN:  Corky Crafts, MDDATE OF BIRTH:  31-Mar-1934   DATE OF PROCEDURE:  06/12/2007  DATE OF DISCHARGE:  06/13/2007                            CARDIAC CATHETERIZATION   PROCEDURES PERFORMED:  Left heart catheterization, left ventriculogram,  coronary angiogram, abdominal aortogram, PCI of the LAD.   OPERATOR:  Dr. Eldridge Dace.   INDICATIONS:  Abnormal stress test and shortness of breath.   PROCEDURE/NARRATIVE:  The risks, benefits of cardiac catheterization  were explained to the patient and informed consent was obtained.  She  was brought to the cath lab.  She was prepped and draped in usual  sterile fashion.  Her right groin was infiltrated WITH 1% lidocaine.  A  6-French arterial sheath was placed into the right femoral artery using  modified Seldinger technique.  Left coronary artery angiography was  performed using a JL-4 pigtail catheter.  The catheter was advanced to  the vessel ostium under fluoroscopic guidance.  Digital angiography was  performed in multiple projections using hand injection of contrast.  Right coronary artery angiography was then performed using a JR-4  pigtail catheter; the catheter was advanced to the vessel ostium under  fluoroscopic guidance.  Digital angiography was performed in multiple  projections using hand injection of contrast.  A pigtail catheter was  then advanced to the ascending aorta and across the aortic valve under  fluoroscopic guidance a power injection of contrast was performed in the  RAO projection into the left ventricle.  The catheter was pulled back  under continuous hemodynamic pressure monitoring.  The catheter was then  withdrawn to the level of the abdominal aorta.  Power injection of  contrast was performed in the AP projection at the  level of the renal  arteries.  PCI of the LAD was then performed.  Please see below for  details.  The sheath was removed and Angio-Seal was deployed for  hemostasis.   FINDINGS:  The left main was a short vessel widely patent.   The left circumflex was large vessel with mild irregularities but  midvessel was quite tortuous.   The OM-1 was large with moderate atherosclerosis.   The OM-2 was a large vessel with mild atherosclerosis throughout.   The left anterior descending was a large vessel.  It was diffusely  diseased in the mid segment from 70 to 80%.  There was a small first and  second diagonal.   The right coronary artery was a large dominant vessel.  There is a mid  vessel 50 to 60% lesion.   Left ventriculogram showed normal ventricular function.  There is no  significant mitral regurgitation.   HEMODYNAMIC RESULTS:  Left ventricular pressure 133/13, LVEDP of 16  mmHg.  Aortic pressure of 133/52 with a mean aortic pressure of 95 mmHg.  The abdominal aortogram showed moderate abdominal aortic  atherosclerosis.  There is a 70% left renal artery stenosis.  The right  renal artery is widely patent.   PCI NARRATIVE:  CLS 3.5 guiding catheter was used to engage the ostium  of the left main.  Prowater wire was placed down the LAD across the  stenosis.  A 2.5 x 30-mm Maverick balloon was then placed across the  lesion and inflated to 6 atmospheres for 25 seconds, then to 8  atmospheres for 20 seconds.  A 2.5 x 28 mm Promus stent was then  advanced across the more distal portion of the lesion and deployed a 14  atmospheres for 45 seconds.  A 32 mm stent was thought to be needed;  therefore a CYPHER stent 3.0 x 33 mm in dimensions was advanced and  attempted to be placed in overlapping fashion with the other stent.  It  would not cross.  A 2.75 x 15-mm Maverick balloon was then placed within  the old stent to post-dilate the proximal area of the stent.  It was  deployed at 10  atmospheres for 30 seconds and then again at 10  atmospheres for 20 seconds.  Two subsequent inflations at a0 atmospheres  were performed for 26 seconds and 19 seconds.  The CYPHER stent did  advance down the vessel and was deployed to 14 atmospheres for 45  seconds.  The stented area was postdilated with a 3.25 x 20-mm Quantum  balloon deployed at 18 atmospheres for 36 seconds, 20 atmospheres for 27  seconds and again at 20 atmospheres for 19 seconds.  There is an  excellent angiographic result.  There is no residual stenosis.   IMPRESSION:  1. Successful stenting of the left anterior descending with single-      vessel coronary disease.  2. Right renal artery stenosis.  3. Normal left ventricular function.   RECOMMENDATIONS:  The patient will continue aspirin and Plavix  indefinitely.  Will watch overnight.  Angiomax was used for  anticoagulation.  Also recommend other risk factor modification  including weight loss.      Corky Crafts, MD  Electronically Signed     JSV/MEDQ  D:  09/03/2007  T:  09/03/2007  Job:  [id number]

## 2010-12-26 NOTE — H&P (Signed)
[name], [name]         ACCOUNT NO.:  [id number]   MEDICAL RECORD NO.:  [id number]          PATIENT TYPE:  INP   LOCATION:  3731                         FACILITY:  [facility]   PHYSICIAN:  Kela Millin, M.D.DATE OF BIRTH:  1934/01/04   DATE OF ADMISSION:  08/27/2007  DATE OF DISCHARGE:                              HISTORY & PHYSICAL   Continuation of history and physical.   PRIMARY CARE PHYSICIAN:  Pam Drown, M.D.   PULMONOLOGIST:  Rennis Chris. Maple Hudson, MD, FCCP, FACP.   EXTREMITIES:  +1 edema bilaterally.  She has erythematous patches on her  lower leg, mildly tender and warm.  NEUROLOGIC:  Alert and oriented x3.  Cranial nerves II-XII grossly  intact.  Nonfocal exam.   LABORATORY DATA:  Chest x-ray and CT angiogram as per HPI.  Her white  cell count is 6.1, hemoglobin 7.3, hematocrit 22.4, platelet count  321,000, MCV 90.9.  Her INR was 1, D-dimer 0.55.  Urinalysis revealed  small leukocyte esterase with 3-6 wbc's, few bacteria.  Sodium 133,  potassium 3.7, chloride 99, CO2 29, glucose 193, BUN 21, creatinine  0.86, calcium 8.4, albumin 3, AST 15.   ASSESSMENT/PLAN:  Problem #1.  Dyspnea, likely multifactorial.  It is  possible the patient was recently treated for asthmatic bronchitis. Per  pulmonology note, has a history of hypoventilation and has severe new  onset anemia as another etiology.  Will obtain a serial cardiac enzymes  as well given the patient's known coronary artery disease and status  post recent stent placement and follow.  Will continue on nebulized  bronchodilator and supplemental oxygen, transfuse and follow above  studies and further manage as appropriately.  Also noted that her D-  dimer is elevated on CT scan angiogram from January 12 is negative for  PE.  Will obtain lower extremity ultrasound to evaluate for DVT and  follow cardiac enzymes as above.   Problem #2.  Severe anemia, normocytic.  Will obtain an anemia panel and  transfuse.   I discussed the patient with Dr. Madilyn Fireman.  Will consult  inpatient GI for further recommendations.  Follow up and monitor H&H.   Problem #3.  Probable urinary tract infection.  Urinalysis as above and  will obtain urine culture.  Empiric antibiotics and follow.   Problem #4.  ? Right lower extremity cellulitis versus stasis  dermatitis.  This is a patient with a history of longstanding peripheral  edema.  Empiric antibiotics and follow.   Problem #5.  Diabetes mellitus.  Monitor Accu-Cheks, cover with sliding  scale insulin.  Will hold oral hypoglycemics for now and if possible,  endoscopy.  Follow and resume outpatient medications when appropriate.   Problem #6. Coronary artery disease.  As discussed above, I have  discussed the patient with Dr. Eldridge Dace and he recommends to keep the  patient on Plavix and states that aspirin can be reduced to 81 mg for  now.  Will obtain cardiac enzymes as above and follow.   Problem #7.  History of renal artery tenderness, status post left renal  stent in November 2008 by Dr. Eldridge Dace.   Problem #8.  History of allergic rhinitis.  Continue outpatient  medications.   Problem #9.  Lung nodule, 8 mm, lingula nodule per CT scan of January  12.  The patient is to follow up with Dr. Maple Hudson for repeat CT scan of  chest in three months.   Problem #10.  Gastroesophageal reflux disease.  Continue proton pump  inhibitor.   Problem #11.  History of hypertension, on atenolol; monitor and manage  as appropriate.      Kela Millin, M.D.  Electronically Signed     ACV/MEDQ  D:  08/29/2007  T:  08/29/2007  Job:  [id number]   cc:   Pam Drown, M.D.  John C. Madilyn Fireman, M.D.  Clinton D. Maple Hudson, MD, FCCP, FACP

## 2010-12-26 NOTE — Assessment & Plan Note (Signed)
[facility]                             PULMONARY OFFICE NOTE   CORALYNN, GAONA                MRN:          [id number]  DATE:05/28/2007                            DOB:          1934/07/10    CHIEF COMPLAINT:  Persistence of acute bronchitis.  Acute care visit  today.   PROBLEM LIST:  1. Asthmatic bronchitis.  2. Ex less-than-10-pack-year smoking hx, quit 1980s.  3. New Isolated low carbon monoxide diffusion in the lung in pulmonary      function tests October 2007 (normal in December 2004)  4. Progressive DOE x 2 years  5. Normal CT scan of the chest in May 2005 except for lingular      scarring  6. 950 feet on 6 minute walk test with no desaturation in 2004.   This is a 77 year old really pleasant obese female who saw Tammy Parrett  on May 26, 2007 for symptoms of acute bronchitis.  She was started  on Avelox treatment.  She has 4 days left off the Avelox treatment.  She  was also started on prednisone, she has one more day of the 5-7 day  prednisone burst remaining.  She returns today to clinic because she is  not feeling any better.  The symptoms are mainly sore throat, chest  congestion, coughing, yellow sputum, wheezing but no fever.  In  questioning she also admits she is not any worse.  Secondary to the  cough she states that her chest hurts all over back and sides and it is  associated with all the acute symptoms.   On May 26, 2007 she had Cardiolite stress test which was negative  according to her history (could not find it in echart).  She is  concerned about her acute bronchitis symptoms still with yellow-green  sputum and chest tightness.   CURRENT MEDICATIONS:  1. Avelox.  2. Tussin cough syrup.  3. Prednisone taper since October 13 for a total of 5 days, last dose      is tomorrow, October 16.  4. Lasix 20 mg.   All other medications are older medications.   PAST MEDICAL HISTORY:  As in problem list.   ALLERGIES:  SHE IS ALLERGIC TO A BUNCH OF MEDICATIONS INCLUDING  PENICILLIN, BIAXIN, ERYTHROMYCIN, CEFTIN AND KEFLEX.   REVIEW OF SYSTEMS:  1. Patient puffiness and weight gain of 5 pounds over the last 5 days      since being on prednisone.  2. Dyspnea on exertion for the last 2 years that has been progressive      with current effort tolerance limited to climbing stairs and      cleaning house prior to current bronchitic episode.   PHYSICAL EXAMINATION:  Weight of 236.6,  temperature 97.5, blood  pressure 132/64, pulse of 65, saturation 91% on room air at rest.  GENERAL:  Obese female having a dry cough.  HEENT:  Mallampati class 3.  RESPIRATORY:  Coarse breath sounds, no wheeze, no crackles, nonfocal.  CARDIOVASCULAR:  Normal heart sounds, no murmurs.  ABDOMEN:  Soft, obese.  Edema present, varicose veins present.   LABORATORY  VALUES:  1. Today's chest x-ray May 27, 2007 no infiltrates.  2. PFTs October 2007 FEV1 1.7 liters 107% predicted, FVC 2.1 liters      87% predicted, FEV1/FVC ratio is normal at 82.  Total lung capacity      is normal at 90%.  DLCO is low at 55%.  Of note she denies any      anemia in recent evaluation by her primary care physician (no data      on echart).  3. Six minute walk test in 2004, she walked 950 feet on room air and      the saturation remained about 92% on room air.  4. PFTs in October in 2007 shows isolated low DLCO of 55%.  5. CT scan of the chest in May 2005 is normal apart from lingular      scarring, there was no evidence of any PE.  6. PFTs in December 2004 are entirely normal including diffusion      capacity.   ASSESSMENT/PLAN:  1. Acute bronchitis.  I think this is stable.  There is no evidence of      any worsening either on symptoms or chest x-ray.  I have      recommended that she extend her prednisone taper to 2 weeks, but      given her weight gain she is reluctant to do this and she has      refused.  Therefore she will  only take for 5 days and if things      worsen she will then call to extend her taper to 2 weeks.  I      recommended she change her antibiotic from Avelox to doxycycline      100 mg p.o. b.i.d. for 7 days.  She confirmed she had no allergies      to doxycycline.  She subsequently went to the pharmacy and told      them she was allergic to tetracyclines (rash).  However, pharmacy      noted she had indeed had tetracycline doxycycline a year ago in      August 2007 without any problems but patient did not recollect      this.  Pharmacy then called me.  I called the patient and on the      phone got into her tetracycline allergy history.  She told me that      she had headaches (had mentioned rash to pharmacy) after taking      tetracycline once in the 1970s.  She did not remember taking      doxycycline a year ago.  Based on this I felt that she was probably      not allergic to tetracyclines.  I have recommended that she take      her doxycycline as described and if she develops headaches to give      a call and then we can stop the doxycycline.   1. Isolated low diffusion capacity.  Her diffusion capacity was normal      in 2004 but in 2007 it was low.  Rest of pulmonary function testing      is normal.  I note that she used to have saturations consistently      above 95% like a few years ago but recently there are some      recording of saturations of 91% room air.  She has also has recent      desaturations with exertion in the  clinic including April 26, 2007. In 2004, on a 6 minute walk test she did not desaturate. So I      suspect that this reduction in diffusion capacity is real.      Nevertheless I will ask for her to check with the primary care      physician what her hemoglobin level was and give [name] a call to see      if there is any anemia contributing to low carbon monoxide      diffusion in the lung.  If indeed her carbon monoxide diffusion in      the lung is low I  have told her to follow that up with Dr. Maple Hudson      during followup.   1. Dyspnea on exertion for 2 years, not sure if this is related to the      low carbon monoxide diffusion in the lung or obesity or any other      cause and I have told her to follow this up with Dr. Maple Hudson.     Kalman Shan, MD  Electronically Signed    MR/MedQ  DD: 05/28/2007  DT: 05/29/2007  Job #: [id number]   cc:   Joni Fears D. Maple Hudson, MD, FCCP, FACP  Rubye Oaks, NP

## 2010-12-26 NOTE — Assessment & Plan Note (Signed)
[facility]                             PULMONARY OFFICE NOTE   KHLOI, RAWL                MRN:          [id number]  DATE:06/10/2007                            DOB:          20-Aug-1933    PROBLEM:  1. Allergic rhinitis.  2. Asthmatic bronchitis.  3. Diabetes.  4. History positive tuberculosis skin test.  5. Peripheral edema.  6. Exogenous obesity.   HISTORY:  She had seen   INCOMPLETE REPORT     Clinton D. Maple Hudson, MD, FCCP, FACP     CDY/MedQ  DD: 06/10/2007  DT: 06/11/2007  Job #: [id number]

## 2010-12-26 NOTE — Discharge Summary (Signed)
[name], ARON         ACCOUNT NO.:  [id number]   MEDICAL RECORD NO.:  [id number]           PATIENT TYPE:   LOCATION:                                 FACILITY:   PHYSICIAN:  Michiel Cowboy, MD    DATE OF BIRTH:   DATE OF ADMISSION:  08/29/2007  DATE OF DISCHARGE:  08/26/2007                               DISCHARGE SUMMARY   DISCHARGE DIAGNOSES:  1. Chronic obstructive pulmonary disease.  2. Coronary artery disease status post drug-eluted stent in October,      2008, by Dr. Eldridge Dace.  3. Diabetes mellitus.  4. Anemia, iron deficient, status post colonoscopy,      esophagogastroduodenoscopy and now a capsule endoscopy.  5. Obesity.  6. Hypotension.  7. History of bradycardia.  8. History of atrial fibrillation.  9. Obesity hypoventilation, per pulmonary.  10.History of renal artery stenosis status post stent placement      November, 2008.  11.History of gastroesophageal reflux disease.  12.History of ovarian cancer status post surgery.   CONSULTANTS:  1. Dr. Eldridge Dace of Cardiology.  2. Dr. Donato Schultz of Cardiology.  3. Dr. Matthias Hughs of GI.   STUDIES:  1. Chest x-ray on August 27, 2007, showing everything the same.      Cardiomegaly without  evidence of acute disease.  2. Capsule study is started on September 12, 2007, results to be read by      GI.   H&P:  Please see full H&P, but briefly this is a 77 year old female with  a history of anemia, coronary artery disease on Plavix and aspirin and  drug-eluted stent in October, 2008, who presents with shortness of  breath.  She had a complicated hospital course.  1. Concerning her shortness of breath, which was felt to be      multifactorial, a combination of COPD exacerbation vs. fluid      overload vs. anemia.  Patient was diuresed of IV Lasix and did      well.  Will discharge home on 40 mg of p.o. Lasix and have followup      with her primary care Rigby Swamy for continued adjustment of this.      Also, for  her COPD, patient completed a course of antibiotics while      in-house.  2. Anemia.  Patient has had an EGD performed in the past by Dr. Ewing Schlein      as well as colonoscopy done while in-house.  No source of bleeding      could be determined.  Patient did have an endoscopy study prior to      discharge and will have followup with GI as an outpatient for      results of study.  Will discharge patient on iron for replacement.  3. Atrial fibrillation and bradycardia.  This was felt to be      originally tachy brady syndrome.  Seen by Cardiology who felt that      the patient would likely benefit from sotalol.  Sotalol was started      and her EKG was monitored daily.  At the time of discharge her QTc  is below 500, at 449.  Her diltiazem and atenolol was discontinued      and she is discharged home with sotalol 80 mg p.o. b.i.d.  Patient      is to follow up Cardiology in March already had been scheduled.  4. GERD.  Continue Protonix.  5. History of COPD.  Will continue home oxygen.  While in-house,      patient was noted to desat to 87% while ambulating on room air; she      needs to be on 2L of oxygen while ambulating and at rest.  The      patient had been actively followed up by Pulmonology in the past      and needs to return to see them again.  6. Coronary artery disease.  Will continue Plavix and aspirin.      Patient will follow up with Cardiology.  During her hospital stay      her Plavix and aspirin had to be continued secondary to her having      recent stent placement.   DISCHARGE MEDICATIONS:  1. Mucinex 1200 mg p.o. twice a day.  2. Flexeril 10 mg p.o. at night.  3. Protonix 40 mg p.o. at night.  4. Singulair 10 mg p.o. daily.  5. Synthroid 25 mcg p.o. daily.  6. Boniva 150 mg p.o. monthly.  7. Ditropan 10 mg p.o. daily.  8. Lovastatin 80 mg p.o. daily.  9. Symbicort 160/25 mg twice daily.  10.Astelin Nasal Spray as needed twice daily.  11.Plavix 75 mg p.o. daily.   12.Lasix 40 mg p.o. daily, patient to hold next two doses as her      creatinine was slightly elevated at 1.1 at the time of discharge.  13.Imdur 30 mg p.o. daily.  14.Aspirin 81 mg p.o. daily.   New Medications:  1. Niferex 150 mg p.o. daily.  2. Synthroid 25 mcg p.o. daily.  3. Sotalol 80 mg p.o. b.i.d.  4. Lantus 32 units subcu at bedtime.  5. Nitroglycerin 0.5 sublingually q.5 minutes p.r.n. chest pain.  6. Patient to stop her diltiazem and her atenolol.  7. Patient to hold off on her Norvasc and her Benicar until seen by      primary care physician, at which point medications could be      restarted if her blood pressure needed.      Michiel Cowboy, MD  Electronically Signed     AVD/MEDQ  D:  09/13/2007  T:  09/14/2007  Job:  [id number]   cc:   Corky Crafts, MD  Jake Bathe, MD  Bernette Redbird, M.D.  Pam Drown, M.D.

## 2010-12-26 NOTE — Assessment & Plan Note (Signed)
[facility]                             PULMONARY OFFICE NOTE   TIKI, TUCCIARONE                MRN:          [id number]  DATE:01/28/2007                            DOB:          1934-03-20    PROBLEM:  1. Allergic rhinitis.  2. Asthmatic bronchitis.  3. Diabetes.  4. 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  5. Peripheral edema.  6. Exogenous obesity.   HISTORY:  She is still coughing.  Sputum is yellow.  Felt particularly  bad yesterday, had some nausea and retching, no chest pain, fever,  adenopathy or blood.  She asked that we discuss a previous chest CT,  which had shown arthrosclerotic calcification in vessels and I did so;  she is going to followup on this with Dr. Corliss Blacker.  We reviewed past  history of positive allergy skin test with no vaccine given.  Chest x-  ray on Dec 19, 2006 had shown chronic lung markings, no active process,  heart at upper limits of normal.   OBJECTIVE:  Weight 227 pounds.  BP 132/74, pulse 70, room air saturation  95%.  There are bilateral mild rhonchi and hoarseness.  Throat is not red.  There is no stridor.  I do not find adenopathy.  HEART:  Sounds regular without murmur.  Moderate turbinate edema.   IMPRESSION:  Rhinitis, possible rhinosinusitis and bronchitis,  persistent.  Body habitus would be consistent with sleep apnea and we  agreed we would come back to that issue.   PLAN:  1. Continue Symbicort 160/4.5.  2. Repeat try 7 days' Omnicef 300 mg twice a day.  3. Nebulizer treatment here, Xopenex 1.25 mg.  4. Saline nasal lavage.  5. Schedule return in 1 month, question CT of sinuses.     Clinton D. Maple Hudson, MD, Tonny Bollman, FACP  Electronically Signed    CDY/MedQ  DD: 01/28/2007  DT: 01/29/2007  Job #: [id number]

## 2010-12-26 NOTE — H&P (Signed)
[name], [name]         ACCOUNT NO.:  [id number]   MEDICAL RECORD NO.:  [id number]          PATIENT TYPE:  INP   LOCATION:  3731                         FACILITY:  [facility]   PHYSICIAN:  Kela Millin, M.D.DATE OF BIRTH:  July 28, 1934   DATE OF ADMISSION:  08/27/2007  DATE OF DISCHARGE:                              HISTORY & PHYSICAL   PRIMARY CARE PHYSICIAN:  Pam Drown, M.D.   PULMONOLOGIST:  Rennis Chris. Maple Hudson, MD, FCCP, FACP.   CHIEF COMPLAINT:  Worsening dyspnea.   HISTORY OF PRESENT ILLNESS:  The patient is a 77 year old white female  with past medical history significant for asthmatic bronchitis/COPD,  allergic rhinitis, coronary artery disease, and status post drug-eluting  stent in October 2008 by Dr. Eldridge Dace, diabetes mellitus, obesity and  hypertension who presents with the above complaints.  She states that  her shortness of breath began a couple of weeks ago and that initially  she was coughing as well.  She went to her pulmonologist's office and  initially was seen by Dr. Sherene Sires and in addition she had bronchodilators  and she was placed on prednisone taper.  She states that her cough  resolved but she continued to have the shortness of breath and went back  for a followup visit at [facility] two days ago, and at that  time,  per her report her ultrasound was 83% and she was placed on home  oxygen and a CT scan angiogram of her chest was ordered.  The CT scan  angiogram was negative for pulmonary embolus. It was noted that exam was  moderately limited secondary to the patient's size and also a lingula  nodule, 8 mm, was noted on exam and a followup CT scan of the chest  recommended in three months.  She reports that she continued to have  shortness of breath and followed up with her primary care physician on  the day prior to admission.  Hemoglobin was done at that time and it was  noted to be low at 8.2, per Dr. Corliss Blacker. Her baseline hemoglobin  from  November 2008 was 12 (the patient's last hemoglobin noted on e-chart in  November was 10.2).  She was sent to [facility]  for direct  admission for further evaluation and management.  Mrs. [name] admits  to melena x2 two weeks.  She also admits to nausea but no vomiting.  She  denies epigastric/abdominal pain.  Also denies chest pain.  It is noted  that since she had the drug-eluting stent placed in October 2008 by  cardiology, she has been on Plavix and aspirin.  Her hemoglobin on  admission today is 7.3 with a hematocrit of 22.4.  Her INR is 1.  Her D-  dimer is 0.55 and a chest x-ray is negative for acute infiltrate.   PAST MEDICAL HISTORY:  1. As stated above.  2. History of positive TB skin test.  3. History of peripheral edema.  4. Hypertension.  5. History of bradycardia.  6. Diabetes mellitus.  7. Obesity with hypoventilation per pulmonary.  8. History of renal artery stenosis, status post stent placement  on      July 03, 2007.  9. History of GERD.  10.History of ovarian cancer, status post surgery.   MEDICATIONS:  1. Atenolol 50 mg daily.  2. Benemid 500 mg b.i.d.  3. Flexeril 10 mg daily.  4. Prilosec 20 mg daily.  5. Singulair 10 mg daily,.  6. Synthroid 25 mcg daily.  7. Zyrtec 10 mg daily.  8. Avandamet daily.  9. Boniva 50 mg.  10.Ditropan 10 mg daily.  11.Multivitamins.  12.Lovastatin 40 mg 2 tablets daily.  13.Proventil.  14.Symbicort 160/4.5.  15.Astelin 137 mcg.  16.Nitroglycerin.  17.Norvasc 10 mg daily.  18.Plavix 75 mg daily.  19.Benicar 40 mg daily.  20.Lasix 40 mg.  States her dose was recently increased.  21.Imdur 30 mg daily.  22.Aspirin 81 mg two tablets daily.  23.Nasonex.   ALLERGIES:  ERYTHROMYCIN, TETRACYCLINE.   SOCIAL HISTORY:  Remote history of tobacco use.   FAMILY HISTORY:  Positive for coronary artery disease.   REVIEW OF SYSTEMS:  As per HPI; other review of systems negative.   PHYSICAL EXAMINATION:   GENERAL:  The patient is a pleasant, obese,  elderly, white female, breathing nonlabored with nasal cannula oxygen  on.  VITAL SIGNS:  Temperature 97.8 with a blood pressure of 134/59, pulse  62, respiratory rate 20, oxygen saturation 96% on 2 L nasal cannula  oxygen.  HEENT:  PERRL.  EOMI.  Slightly dry mucous membranes.  Sclerae  anicteric.  No exudates.  NECK:  Supple.  No adenopathy.  No JVD.  No thyromegaly.  LUNGS:  Decreased breath sounds at the bases.  No wheezes.  No crackles.  CARDIOVASCULAR:  Regular rate and rhythm.  Normal S1 and S2.  ABDOMEN:  Obese.  Soft.  Bowel sounds present.  Nontender.  Nondistended.  No organomegaly and no masses.  EXTREMITIES:  +1 edema.   Dictation ended here.     Kela Millin, M.D.  Electronically Signed    ACV/MEDQ  D:  08/29/2007  T:  08/29/2007  Job:  [id number]

## 2010-12-26 NOTE — Assessment & Plan Note (Signed)
[facility]                             PULMONARY OFFICE NOTE   Terri, [name]                MRN:          [id number]  DATE:06/10/2007                            DOB:          [birth year]    PROBLEMS:  1. Allergic rhinitis.  2. Asthmatic bronchitis.  3. Diabetes.  4. History positive tuberculosis skin test.  5. Peripheral edema.  6. Exogenous obesity.   HISTORY:  Saw the nurse practitioner and then Dr. Marchelle Gearing recently.  She had developed some peripheral edema on doxycycline.  Being evaluated  for renal artery stenosis pending heart and renal artery  catheterization.  She had had a chest x-ray when she first developed  bronchitis October 13 showing no acute findings.  When she saw Dr.  Marchelle Gearing 2 days later, he did another chest x-ray again just saying no  acute findings, lungs low in volume without focal airspace disease,  thoracic aorta calcified.  Pulmonary function tests had shown diffusion  capacity 55% of predicted a year ago.  This is consistent with her  vascular disease and her significant abdominal obesity with  hypoventilation.  She has a little residual cough, but mostly the  bronchitis has resolved.   MEDICATIONS:  Listed and reviewed.  She does have a Proventil inhaler,  Symbicort 160/4.5 and Nasonex.   OBJECTIVE:  Weight 232 pounds, BP 138/70, pulse 76, room air saturation  is 92% at rest.  She seems in no distress.  There is no neck vein distension or peripheral edema.  Lung fields are clear but shallow.  Heart sounds regular without murmur.   IMPRESSION:  Resolving bronchitis.  Diffusion capacity reduction is  consistent with her obesity and hypoventilation.  She does have diffuse  atherosclerotic disease.   PLAN:  We are scheduling a followup pulmonary function test to make sure  there is not a long-term trend in her pulmonary functions, especially in  regards to diffusion capacity.  Flu vaccine discussed  and given.  Schedule return 3 months, earlier p.r.n.     Clinton D. Maple Hudson, MD, Tonny Bollman, FACP  Electronically Signed    CDY/MedQ  DD: 06/10/2007  DT: 06/11/2007  Job #: [id number]

## 2010-12-26 NOTE — Op Note (Signed)
[name], [name]         ACCOUNT NO.:  [id number]   MEDICAL RECORD NO.:  [id number]          PATIENT TYPE:  OBV   LOCATION:  6527                         FACILITY:  [facility]   PHYSICIAN:  Corky Crafts, MDDATE OF BIRTH:  10/29/1933   DATE OF PROCEDURE:  07/03/2007  DATE OF DISCHARGE:                               OPERATIVE REPORT   REFERRING PHYSICIAN:  Pam Drown, M.D.   PROCEDURES PERFORMED:  Selective left renal angiogram, pelvic angiogram,  PTA and stent of the left renal artery.   SURGEON:  Corky Crafts, M.D.   INDICATIONS:  Hypertension requiring multiple antihypertensives for  control and renal artery stenosis.   PROCEDURE:  The risks and benefits of peripheral angiography were  explained to the patient, and informed consent was obtained.  The  patient was brought to the [facility] lab.  She was prepped and draped in the  usual sterile fashion.  Her right groin was infiltrated with 1%  lidocaine.  A 6-French arterial sheath was placed into the right femoral  artery using the modified Seldinger technique.  A 6-French IMA guiding  catheter was advanced to the infrarenal abdominal aorta and ultimately  into the left renal artery.  Diagnostic angiography was performed.  Heparin was administered for anticoagulation.  The patient was already  on Plavix.  A stabilizer wire was then advanced across the lesion, and  after the stenosis was confirmed, there was also 40-mm gradient across  the lesion.  A 5.0 x 15-mm by ViaTrack balloon was then placed across  the lesion and inflated to 8 atmospheres for 38 seconds.  The balloon  was then withdrawn, and a 6.0 x 12-mm Herculink stent was then placed  across the lesion and deployed a 10 atmospheres for 42 seconds.  The  ostium of the stent was then flared with an inflation to 8 atmospheres  for 23 seconds.  There was an excellent angiographic result.  During the  procedure, it was noted that there was difficulty in  navigating J-wire  in the area of the aortoiliac bifurcation.  A short pigtail catheter was  then advanced, and a pelvic angiogram was then performed with a power  injection of contrast.  There was no significant stenosis.  There was  some calcification noted in that area.  The patient tolerated the  procedure well.   IMPRESSION:  1. Successful percutaneous transluminal angioplasty/stent of the left      renal artery with a 6.0 x 12-mm Herculink stent deployed at      approximately 6.2 mm in diameter to relieve the left renal artery      stenosis.  2. No significant aortoiliac disease by pelvic angiogram.   RECOMMENDATIONS:  Continue aspirin and Plavix indefinitely.  Watch the  patient overnight and manage her blood pressure medicines accordingly.      Corky Crafts, MD  Electronically Signed    JSV/MEDQ  D:  07/03/2007  T:  07/03/2007  Job:  [id number]

## 2010-12-26 NOTE — Op Note (Signed)
[name], [name]         ACCOUNT NO.:  [id number]   MEDICAL RECORD NO.:  [id number]          PATIENT TYPE:  INP   LOCATION:  3731                         FACILITY:  [facility]   PHYSICIAN:  Bernette Redbird, M.D.   DATE OF BIRTH:  1933-10-02   DATE OF PROCEDURE:  09/12/2007  DATE OF DISCHARGE:  09/13/2007                               OPERATIVE REPORT   PROCEDURE:  Small bowel capsule endoscopy.   INDICATIONS:  Anemia, heme positive stool and history of recent melena  with unrevealing endoscopy and colonoscopy approximately 2 weeks ago.   FINDINGS:  Normal exam.   PROCEDURE IN DETAIL:  The procedure was done at the bedside at [facility].  The capsule was swallowed orally.  Transit to the duodenum was  only 9 minutes, transit time to the cecum was 4 hours.   This was a normal examination except for a solitary distal ileal  diverticulum.   Specifically, no source of bleeding or heme positive stool or anemia was  evident in the small bowel.   The small bowel mucosa was normal throughout its entire length and I did  not see any ulcerations, masses or vascular ectasia.   IMPRESSION:  I think most likely the patient had a transient subacute  subclinical bleed prior to admission related to her aspirin and Plavix  therapy, but that the causative lesion had resolved by the time of the  patient's endoscopy/colonoscopy and capsule endoscopy.   RECOMMENDATIONS:  1. No further gastrointestinal workup needed at this time.  2. No visualized contraindication to the patient's aspirin and Plavix      therapy.           ______________________________  Bernette Redbird, M.D.     RB/MEDQ  D:  09/13/2007  T:  09/14/2007  Job:  [id number]   cc:   Petra Kuba, M.D.  Pam Drown, M.D.  Clinton D. Maple Hudson, MD, FCCP, FACP  Corky Crafts, MD

## 2010-12-26 NOTE — Consult Note (Signed)
[name], [name]         ACCOUNT NO.:  [id number]   MEDICAL RECORD NO.:  [id number]          PATIENT TYPE:  INP   LOCATION:  3731                         FACILITY:  [facility]   PHYSICIAN:  Petra Kuba, M.D.    DATE OF BIRTH:  1934-04-07   DATE OF CONSULTATION:  08/27/2007  DATE OF DISCHARGE:                                 CONSULTATION   GI CONSULTATION:   HISTORY:  Patient was seen at the request of Drs. Braulio Bosch and  Viyouh for anemia.  She has had melena off and on for about a month, did  have a stent placed by Dr. Eldridge Dace in her left renal artery and is  requiring both aspirin and Plavix long-term for that.  That was done in  November.  She does have some upper tract symptoms.  Note:  She has a  hiatal hernia, but no previous endoscopy, has had some protracted  symptoms.  She has also had some colonoscopies in the past with the  history of polyps, that at her last colonoscopy they were unsuccessful  at getting around, but the BE was supposedly nondiagnostic.  Her chart,  unfortunately, is in storage.  The BE is not in the computer, probably  predates 2002, but she has not had any recent colon tests; other than  her chronic constipation, has not had any lower bowel tests.   PAST MEDICAL HISTORY:  Pertinent for the left renal artery stenosis, as  above; hypertension, colon polyps, hiatal hernia, diabetes, history of  ovarian cancer, status post surgery, as well as shoulder surgery.  She  has also had a lumbar discectomy, hemorrhoidectomy, cholecystectomy and  a lumpectomy.   SOCIAL HISTORY:  Married.  Quit smoking in the past.   FAMILY HISTORY:  Negative for any obvious GI problems.   ALLERGIES:  TETRACYCLINE, ERYTHROMYCIN, BIAXIN, AVELOX and DOXYCYCLINE.   CURRENT MEDICATIONS INCLUDE:  Avandamet, Prilosec, amlodipine, atenolol,  lovastatin, Benicar, Synthroid, Benemid, Zyrtec, Singulair, Symbicort,  Mucinex, Nasonex, Estring vaginal ring, Boniva, Proventil,  Tylenol, baby  aspirin, Flexeril, calcium, multivitamin, stool softeners, Plavix,  Lasix, nitroglycerin, and Imdur and O2 p.r.n.   REVIEW OF SYSTEMS:  Negative, except for above, except for shortness of  breath and weakness.   PHYSICAL EXAM:  No acute distress.  VITAL SIGNS:  Stable, afebrile.   LABORATORY DATA:  Pertinent for a BUN of 21, creatinine 0.9.  Hemoglobin  8.2 with an MCV of 90.  She did have a hemoglobin of 12 in November.  Platelet count normal.  PT normal.   ASSESSMENT:  1. Multiple medical problems.  2. Melena in a patient on aspirin and Plavix.  3. History of hiatal hernia and colon polyps, failed colonoscopy in      the past.   PLAN:  The risks, benefits, methods of endoscopy were discussed.  We  compared it to the colonoscopy that she had and we will proceed  tomorrow.  Dr. Eldridge Dace has asked [name] to continue aspirin and Plavix.  Depending on those findings, we will decide on re-try of colonoscopy.  In the meantime, I have called my office to try to get her  chart out of  storage to get those records.  We will keep her on clear liquids and  pump inhibitors in the meantime.           ______________________________  Petra Kuba, M.D.     MEM/MEDQ  D:  08/27/2007  T:  08/27/2007  Job:  [id number]   cc:   Pam Drown, M.D.  John C. Madilyn Fireman, M.D.  Kela Millin, M.D.

## 2010-12-26 NOTE — Assessment & Plan Note (Signed)
[facility]                             PULMONARY OFFICE NOTE   [name], [name]                MRN:          [id number]  DATE:12/26/2006                            DOB:          1934/07/06    HISTORY OF PRESENT ILLNESS:  Patient is a 77 year old white female  patient of Dr. Roxy Cedar, has a known history of asthmatic bronchitis,  allergic rhinitis, presents today to the office for persistent cough and  congestion.  Patient was seen in the office last week for an asthmatic  bronchitis exacerbation.  Chest x-ray was unremarkable, except for  chronic lung markings.  There was no evidence of any acute infiltrate or  edema.  Patient was started on a 7-day course of Avelox and a prednisone  taper.  Patient also had some lower extremity edema and was given Lasix  20 mg, to be used as needed.  The patient reports the symptoms have only  improved slightly, and she continues to have significant cough and  congestion, with severe coughing paroxysms.  Patient does report that  her lower extremity swelling has improved substantially.  Patient denies  any hemoptysis, orthopnea, PND, abdominal pain or overt reflux symptoms.  Although patient did have some nausea last evening, I felt that as if  she could throw up.  Patient is maintained on Prilosec twice daily.   PAST MEDICAL HISTORY:  Reviewed.   CURRENT MEDICATIONS:  Reviewed.   PHYSICAL EXAMINATION:  GENERAL:  Patient is a pleasant female, in no  acute distress.  She is afebrile with stable vital signs.  O2  saturations 95% on room air.  HEENT:  Unremarkable.  NECK:  Supple without adenopathy.  No JVD.  LUNGS:  Sounds reveal course rhonchi bilaterally with a few expiratory  wheezes.  CARDIAC:  A regular rate and rhythm.  ABDOMEN:  Soft and nontender.  EXTREMITIES:  Warm without any calf cyanosis, clubbing or edema.   IMPRESSION/PLAN:  Slow to resolve asthmatic bronchitic exacerbation.  Patient  will extend out Avelox an additional three days, for a total of  10 days of therapy.  She will continue on Mucinex DM twice daily and  will add in Tramadol 50 mg every 4 hours as needed for cough control.  Patient was given a Depo Medrol 120 IM injection in the office.  Is to  monitor her blood sugars closely and call if blood sugars are over to  250.  Patient was given a Xopenex nebulizer treatment in the office and  has been recommended to stop fish oil and flax seed oil, until cough  resolves.  She will continue on Prilosec twice daily, along with reflux  preventative measures.  Follow back up here in one week or sooner if  needed.  Patient is advised, if symptoms worsen  or are not improving, she is to contact our office for sooner followup  or go to the emergency department.      Rubye Oaks, NP  Electronically Signed      Clinton D. Maple Hudson, MD, Tonny Bollman, FACP  Electronically Signed   TP/MedQ  DD: 12/26/2006  DT: 12/26/2006  Job #:  [id number] 

## 2010-12-26 NOTE — Consult Note (Signed)
[name], [name]         ACCOUNT NO.:  [id number]   MEDICAL RECORD NO.:  [id number]          PATIENT TYPE:  INP   LOCATION:  3731                         FACILITY:  [facility]   PHYSICIAN:  Corky Crafts, MDDATE OF BIRTH:  Oct 22, 1933   DATE OF CONSULTATION:  09/01/2007  DATE OF DISCHARGE:                                 CONSULTATION   REFERRING PHYSICIAN:  Pam Drown, M.D.   REASON FOR CONSULTATION:  1. Anemia.  2. Shortness of breath.  3. Coronary artery disease.  4. Renal artery stenosis.   HISTORY OF PRESENT ILLNESS:  The patient is a 77 year old woman who had  a drug-relating stent placed to the LAD back in October of 2008.  She  also had a left renal artery stent placed.  She has been on 162 mg of  aspirin and Plavix.  She had been experiencing melena, and then became  short of breath and fatigued.  She had a CAT of her chest, which was  negative for PE.  She has had an EGD and colonoscopy which were  negative.  She has been transfused 3 units, and has been maintaining her  hemoglobin for a short period of time.  She is being considered for a  capsule study.  She remains short of breath.  We are consulted to  evaluate to see if is coming from a cardiac source.   PAST MEDICAL HISTORY:  1. Hypertension.  2. Diabetes.  3. Obesity.  4. GERD.  5. Renal artery stenosis.  6. Coronary artery disease, status-post TCI to the LAD.   SOCIAL HISTORY:  The patient smoked tobacco in the past.   FAMILY HISTORY:  Is significant for coronary artery disease.   ALLERGIES:  TETRACYCLINE, ERYTHROMYCIN, BIAXIN, AVELOX, DOXYCYCLINE,  NOVOCAIN, CODEINE.   CURRENT MEDICATIONS:  1. Baby aspirin 81 mg a day.  2. Astelin.  3. Symbicort.  4. Plavix 75 mg a day.  5. Flonase.  6. Humibid.  7. Imdur 30 mg a day.  8. Claritin.  9. Singulair.  10.Protonix.  11.Tenormin 50 mg a day.  12.Zithromax.  13.Rocephin.  14.Flexeril 10 mg daily.  15.Lasix 60 mg a day.  16.Sliding-scale insulin.  17.Synthroid 25 mcg a day.  18.Solu-Medrol 40 mg IV q. 12.  19.Ditropan 10 mg.  20.Zocor 40 mg a day.   REVIEW OF SYSTEMS:  No recent fevers or chills.  She has had weakness,  shortness of breath.  No chest pain.  No palpitations.  No focal  weakness.  No rash.  She has had dark stool.  There has been a little  bit of dark blood from what can tell.  All other systems negative.   PHYSICAL EXAMINATION:  VITAL SIGNS:  Blood pressure 136/pulse 95,  respiratory rate 16.  IN GENERAL:  She is awake, alert, and in no apparent distress.  NECK:  No JVD.  CARDIOVASCULAR:  Regular rate and rhythm.  S1, S2.  LUNGS:  Rhonchi bilaterally.  No wheezing.  ABDOMEN:  Is obese and nontender.  EXTREMITIES:  Show trace pretibial edema.  There is an area of warmth on  the right calf with  some erythema.  NEUROLOGICAL:  Alert and oriented.  SKIN:  Rash, as described above on the right shin.   LABORATORY DATA:  Most recent hemoglobin 9.3.  Hematocrit 27.9.  Initial  hemoglobin 7.3.  Hematocrit 22.4.  Creatinine 0.74.  BNP 360.  EKG  showed normal sinus rhythm.  No pathologic Q-waves.  No ST-T wave  changes.   ASSESSMENT/PLAN:  1. This is a 78 year old with dyspnea, which is likely multi-      factorial, coming from an upper respiratory infection, bronchitis,      obesity, and perhaps fluid overload because of her recent blood      transfusion.  Will check a 2-D echo to assure no acute change in      ejection fraction.  2. Would also give IV Lasix instead of the p.o. while she is in the      hospital.  This may help her shortness of breath.  3. Continue antibiotics for respiratory infection.  4. Continue trying to keep her hemoglobin over 9.  5. Gastrointestinal work-up per Dr. Ewing Schlein.  I believe she is being      considered for a capsule study to see if her source is in the small      bowel.  At this point, her drug-relating stents are well under one      year old.   Therefore, I think it would be beneficial for her to be      on Plavix, although this is clearly creating a difficult situation      in regards to an occult gastrointestinal bleed.  6. Will follow the patient while she is in the hospital.     Corky Crafts, MD  Electronically Signed    JSV/MEDQ  D:  09/01/2007  T:  09/01/2007  Job:  [id number]

## 2010-12-26 NOTE — Assessment & Plan Note (Signed)
[facility]                             PULMONARY OFFICE NOTE   [name], [name]                MRN:          [id number]  DATE:01/01/2007                            DOB:          [birth year]    PROBLEM LIST:  1. Allergic rhinitis.  2. Asthmatic bronchitis.  3. Diabetes.  4. 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  5. Peripheral edema.  6. Exogenous obesity.   HISTORY:  She has been seen a couple of times this spring by the nurse  practitioner, slow to resolve her bronchitis despite treatment with Depo-  Medrol and Omnicef.  Now describes 2 weeks of head congestion, chest  congestion, some frontal headache, still some yellow.  She took Avelox  and then cefdinir and Tussionex.   OBJECTIVE:  VITAL SIGNS: Weight 226 pounds.  Blood pressure 138/64,  pulse 57, room air saturation 95%.  GENERAL:  She is quite obese.  HEENT:  Mucoid nasal discharge and some postnasal drip.  LUNGS: Congestive cough.  No dullness.  CARDIAC:  Heart sounds normal.  ADENOPATHY:  No adenopathy.   IMPRESSION:  Bronchitis and rhinosinusitis.   PLAN:  1. Nebulized Xopenex 1.25 mg.  2. Depo-Medrol 80 mg IM.  3. Mucinex.  4. Fluids.  5. Keep scheduled appointment.     Clinton D. Maple Hudson, MD, Tonny Bollman, FACP  Electronically Signed    CDY/MedQ  DD: 01/28/2007  DT: 01/29/2007  Job #: [id number]

## 2010-12-26 NOTE — Assessment & Plan Note (Signed)
[facility]                             PULMONARY OFFICE NOTE   OLA, RAAP                MRN:          [id number]  DATE:12/19/2006                            DOB:          04/06/1934    HISTORY OF PRESENT ILLNESS:  The patient is a 77 year old white female  patient of Dr. Maple Hudson, has a known history of asthmatic bronchitis,  allergic rhinitis, and morbid obesity.  She returns today related to  persistent cough and congestion.  The patient was seen approximately one  month ago here for an acute tracheobronchitis.  The patient was given a  7-day course of Omnicef.  The patient reports her symptoms did improve,  however over the last 2 weeks symptoms have slowly returned, with  increased cough and congestion and wheezing.  The patient also complains  that she continues to have lower extremity edema that is somewhat worse  especially in the afternoon.  The patient denies any hemoptysis,  orthopnea, PND.  The patient does report she has had some recent  medication changes including Norvasc and atenolol being increased and  started on Prilosec.   Past medical history is reviewed.  Current medications reviewed.   PHYSICAL EXAMINATION:  The patient is a pleasant female in no acute  distress.  She is afebrile with stable vital signs.  Oxygen saturation  is 95% on room air.  Weight is up 5 pounds to 234.  HEENT:  Posterior pharynx is clear.  NECK:  Soft, without cervical adenopathy, no JVD.  LUNG SOUNDS:  Reveal coarse breath sounds bilaterally with some upper  airway pseudo wheezing.  CARDIAC:  A regular rate and rhythm with a grade 1/6 systolic murmur.  ABDOMEN:  Soft, obese and nontender.  EXTREMITIES:  Warm, without any calf tenderness, cyanosis or clubbing.  There is 1+ edema bilaterally with venous insuffiency changes.   IMPRESSION AND PLAN:  1. Slow to resolve asthmatic bronchitic flare.  The patient is to      begin Avalox x7 days.   Mucinex DM twice daily.  A short prednisone      taper over the next week.  The patient may use Tussionex #4oz, no      refills, 1 teaspoon every 12 hours as needed for cough control.      The patient is aware of sedating effect.  Chest x-ray pending at      time of dictation.  The patient will return here next week for a      followup with Dr. Maple Hudson.  The patient may need to discuss with      primary care physician that increased atenolol dose may be      aggravating her asthma symptoms.  2. Lower extremity edema.  The patient may begin Lasix 20 mg over the      next 4 days.  The      patient is to keep lower extremities elevated as much as possible.      As above, she will return here in one week or sooner if needed.      Rubye Oaks, NP  Electronically Signed  Clinton D. Maple Hudson, MD, Tonny Bollman, FACP  Electronically Signed   TP/MedQ  DD: 12/19/2006  DT: 12/19/2006  Job #: [id number]

## 2010-12-26 NOTE — Discharge Summary (Signed)
[name], [name]         ACCOUNT NO.:  [id number]   MEDICAL RECORD NO.:  [id number]          PATIENT TYPE:  INP   LOCATION:  6527                         FACILITY:  [facility]   PHYSICIAN:  Corky Crafts, MDDATE OF BIRTH:  07-04-1934   DATE OF ADMISSION:  07/03/2007  DATE OF DISCHARGE:  07/04/2007                               DISCHARGE SUMMARY   DISCHARGE DIAGNOSES:  1. Left renal artery stenosis status post stent placement.  2. Bradycardia, atenolol adjusted.  3. Hypertension.  4. Long-term medication use.  5. Diabetes.  6. Obesity.  7. History of ovarian cancer status post surgery.  8. DRUG ALLERGIES TO ERYTHROMYCIN, TETRACYCLINE AND SOME TYPE OF      ALLERGY TO A PAIN MEDICATION.   HOSPITAL COURSE:  [name] is a 77 year old female that was  admitted with claudication.  She underwent a selective left renal  angiogram/pelvic angiogram.  She was found to have 70% left renal artery  stenosis and a stent was placed successfully.  There was no significant  aortoiliac disease.  The patient was placed on aspirin and Plavix  indefinitely and watched overnight.   LABORATORY STUDIES UPON DISCHARGE:  Showed BUN 12, creatinine 1.0,  sodium 134, potassium 4.1, hemoglobin 10.2, hematocrit 30.3.   Telemetry showed some sinus bradycardia transient with a 2 or 3.2 second  pause.   The patient will be discharged to home on an augmented atenolol dose as  well as adding Imdur for pressure control.  Blood pressure is labile  with systolic pressures ranging from 115 to 170.   DISCHARGE MEDICATIONS:  1. Prilosec 20 mg p.o. b.i.d.  2. Flexeril 10 mg once a day.  3. Benemid 500 mg twice a day.  4. Avandamet 2/500 mg twice a day; this is to be restarted on July 06, 2007.  5. Singulair 10 mg a day.  6. Ditropan XL 10 mg a day.  7. Zyrtec 10 mg a p.r.n.  8. Synthroid 25 mcg a day.  9. Norvasc 20 mg a day.  10.Atenolol decreased to 50 mg a day.  11.Mucinex DM  p.r.n.  12.Benicar 40 mg daily.  13.Lovastatin 80 mg daily.  14.Stool softener two tablets once a day.  15.Calcium with vitamin D daily.  16.Aspirin 325 mg one tablet daily.  17.Multivitamin daily.  18.Estra Ring every three months.  19.Proventil inhaler p.r.n.  20.Boniva once a month.  21.Tylenol as needed.  22.Symbicort two puffs twice a day.  23.Maxzide as prior to admission.   We added Imdur 30 mg one tablet daily.  Again, we changed the atenolol  dose from 75 mg a day to 50 mg a day because of the bradycardia.   DISCHARGE INSTRUCTIONS:  1. Remain on a low sodium, heart-healthy diabetic diet.  2. Clean puncture site gently with soap and water, no scrubbing.  3. No driving for two days.  4. No lifting over ten pounds for one week.  5. Follow up with Dr. Eldridge Dace on July 18, 2007 at 11:30 a.m.      Guy Franco, P.A.      Corky Crafts, MD  Electronically Signed    LB/MEDQ  D:  07/04/2007  T:  07/04/2007  Job:  [id number]

## 2010-12-28 ENCOUNTER — Ambulatory Visit: Payer: Medicare Other | Admitting: Physical Therapy

## 2010-12-29 NOTE — H&P (Signed)
NAMEJAYMEE, [name]   MEDICAL RECORD NO.:  [id number]          PATIENT TYPE:  INP   LOCATION:  0478                         FACILITY:  [facility]   PHYSICIAN:  Vania Rea. Supple, M.D.  DATE OF BIRTH:  November 29, 1933   DATE OF ADMISSION:  07/20/2004  DATE OF DISCHARGE:  07/23/2004                                HISTORY & PHYSICAL   This is a history and physical from a written history and physical for the  patient from July 10, 2004 for her surgical date of July 20, 2004.   CHIEF COMPLAINT:  Right shoulder pain.   HISTORY OF PRESENT ILLNESS:  [name] is a 77 year old female who has  had symptomatic glenohumeral osteoarthrosis for a long time now.  She has  now failed multiple attempts at injections as well as conservative  treatment.  Discussion for shoulder hemiarthroplasty versus total shoulder  replacement as well as risks and benefits throughout.  At this time she  wished to proceed.   PAST MEDICAL HISTORY:  1.  COPD.  2.  Asthma.  3.  Hypertension.  4.  GERD.  5.  Non-insulin dependent diabetes.   MEDICATIONS:  1.  Zocor.  2.  Binimed.  3.  Bellamine.  4.  Imipramine.  5.  Synthroid.  6.  Ditropan.  7.  Hydrochlorothiazide.  8.  Atenolol.  9.  Verapamil.  10. Glucophage.  11. Aciphex.  12. Advair.  13. Over-the-counter vitamins.   ALLERGIES:  1.  Erythromycin.  2.  Tetracycline.   PAST SURGICAL HISTORY:  1.  Lumbar discectomy.  2.  Hemorrhoidectomy.  3.  Hysterectomy.  4.  Cholecystectomy.  5.  Breast lumpectomy.   SOCIAL HISTORY:  She is married.  She is a housewife.  She quit smoking  remotely.  She does have a husband who will help and friends and daughters  in the evenings.  She sees Dr. Jacqulyn Bath.   REVIEW OF SYSTEMS:  The patient denies any recent fevers, chills, night  sweats, no bleeding tendencies.  She has no blurred or double vision.  RESPIRATORY:  No shortness of breath, productive cough, or  hemoptysis.  CARDIOVASCULAR:  She has had a negative Persantine Cardiolite approximately  three years ago.  Denies chest pain.  GI:  No nausea, vomiting,  constipation.  She does have a history of chronic constipation.  GENITOURINARY:  She does have a history of cystitis postoperatively and  urinary frequency routinely.   PHYSICAL EXAMINATION:  GENERAL:  Moderately obese 77 year old female in no  apparent distress.  VITAL SIGNS:  Blood pressure measured at 146/64.  HEENT:  Normocephalic, atraumatic.  Extraocular movements intact.  Neck  supple, no lymphadenopathy.  CHEST:  Some mild wheezing, otherwise clear.  No rhonchi.  HEART:  Regular rate and rhythm.  ABDOMEN:  Obese.  Positive bowel sounds.  EXTREMITIES:  She has significantly limited right shoulder motion and  internal and external rotation with painful arcs of motion.  There is  significant crepitus on motion and she is neurovascularly intact.   IMPRESSION:  1.  Osteoarthrosis of right glenohumeral joint.  2.  Chronic obstructive pulmonary  disease.  3.  Asthma.  4.  Hypertension.  5.  Gastroesophageal reflux disease.  6.  Non-insulin-dependent diabetes mellitus.   PLAN:  Admission for right shoulder hemiarthroplasty versus total shoulder  arthroplasty.      TAS/MEDQ  D:  09/14/2004  T:  09/14/2004  Job:  [id number]

## 2010-12-29 NOTE — Assessment & Plan Note (Signed)
[facility]                               PULMONARY OFFICE NOTE   [name], [name]                MRN:          [id number]  DATE:03/04/2006                            DOB:          1933/09/25    PROBLEM LIST:  1.  Allergic rhinitis.  2.  Asthmatic bronchitis.  3.  Diabetes.  4.  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   HISTORY:  She says with Dr. Modesto Charon leaving, she is going to seek followup with  his former partner, Dr.  Blossom Hoops.  She feels much better after treatment here in mid-June with  doxycycline and initiation of Maxzide 25.  The edema in her legs has gone  down significantly.  She still feels a little pressure discomfort in the  right ear.  She is concerned about rash on her legs by which she means I  think edema and scaling.  She can pop her ear.  There is no purulent  discharge, fever or bleeding.   MEDICATIONS:  1.  Synthroid 0.025 mg.  2.  Benemid 500 mg b.i.d.  3.  Tenormin.  4.  Singulair 10 mg.  5.  Zyrtec 10 mg.  6.  Lovastatin 40 mg.  7.  Bellamine.  8.  Avandamet 2/500 b.i.d.  9.  Verapamil 240 mg b.i.d.  10. Benicar 20 mg.  11. Aciphex 20 mg.  12. Albuterol inhaler used rarely.   ALLERGIES:  Drug intolerance to PENICILLIN, KEFLEX and ERYTHROMYCIN.  She  can tolerate Ketek and Ceftinir.  Keflex caused edema.   OBJECTIVE:  Weight 219 pounds compared to 227 pounds January 28, 2006, and 221  pounds Dec 28, 2005.  She is obese.  There is ankle edema, left greater than  right.  Negative Homan.  There are some dry scaling changes, possibly early  stasis dermatitis changes on her legs.  Chest is almost clear with no cough  and with no increased work of breathing.  Tympanic membranes are negative  with minimal cerumen in the canals.  Heart sounds are regular without murmur  or gallop.  There is no neck vein distention or stridor.  Nasal airway is  not significantly congested today.   IMPRESSION:  Peripheral  edema possibly due to venous insufficiency and her  obesity.  I am not sure if there is a component of cor pulmonale.  She does  have some underlying asthma, bronchitis and rhinitis.   PLAN:  1.  I suggested that she use existing medication, doubling her Maxzide 25 mg      to b.i.d. x3 days only and then drop back to once daily.  2.  Schedule return in six weeks, earlier p.r.n.  3.  She is to get an appointment to get herself established with her new      physician for primary care.                                   Clinton D. Maple Hudson, MD, FCCP, FACP   CDY/MedQ  DD:  03/04/2006  DT:  03/05/2006  Job #:  [id number]   cc:   Leanne Chang, MD  Lacey Jensen

## 2010-12-29 NOTE — Discharge Summary (Signed)
NAMEJEMIAH, [name]   MEDICAL RECORD NO.:  [id number]          PATIENT TYPE:  INP   LOCATION:  0478                         FACILITY:  [facility]   PHYSICIAN:  Vania Rea. Supple, M.D.  DATE OF BIRTH:  12-31-1933   DATE OF ADMISSION:  07/20/2004  DATE OF DISCHARGE:  07/23/2004                                 DISCHARGE SUMMARY   ADMISSION DIAGNOSES:  1.  Right shoulder glenohumeral osteoarthrosis.  2.  Chronic obstructive pulmonary disease and asthma.  3.  Hypertension.  4.  Gastroesophageal reflux disease.  5.  Non-insulin-dependent diabetes.   DISCHARGE DIAGNOSES:  1.  Right shoulder glenohumeral osteoarthrosis.  2.  Chronic obstructive pulmonary disease and asthma.  3.  Hypertension.  4.  Gastroesophageal reflux disease.  5.  Non-insulin-dependent diabetes.  6.  Status post right shoulder hemiarthroplasty.   OPERATION:  Right shoulder hemiarthroplasty, surgeon -- Vania Rea. Supple,  M.D., assistant -- Lucita Lora. Shuford, P.A.-C., under general anesthetic with  an interscalen block.   CONSULTS:  None.   HISTORY:  [name] is a 77 year old female who has been followed in  our outpatient clinic for end-stage osteoarthrosis of the glenohumeral  joint.  She has failed outpatient conservative management and wishes to  proceed with total shoulder versus hemiarthroplasty as indicted.  Risks and  benefits were discussed at length and she wished to proceed.   HOSPITAL COURSE:  The patient was admitted, underwent the above-named  procedure and tolerated this well.  All appropriate IV antibiotics and  analgesics were utilized.  Postoperatively, she was allowed OT consult and  was allowed ADLs and pendulums with maximum passive range of motion with 30  degrees external rotation, 60 degrees of abduction and 90 degrees of forward  flexion.  Routine IV analgesics were instituted and were weaned to p.o.'s.  By postoperative day 3, her pain was controlled  on p.o.'s.  She was  medically and orthopedically stable.  She remained afebrile.  Neurovascularly, she was intact.  Respiratory status remained stable.  At  this time, all arrangements have been made for discharge to home with home  health therapy.   LABORATORY DATA:  Laboratory data section shows admission hemoglobin 11.9,  chemistries with a sodium of 133, urinalysis negative on admission.   EKG shows a normal sinus rhythm with a normal EKG, no sign of change.   Blood type shows A positive.   Chest x-ray showed chronic changes, no active process.   CONDITION ON DISCHARGE:  Stable and improved.   DISCHARGE MEDICATIONS AND PLANS:  The patient will be discharged to home.  She will follow up in our office at 2 weeks postop, call for a time.  Resume  her regular diet at home and regular medications.  She was allowed pendulum  exercise as well as elbow, wrist and hand range of motion.  She was to do  range of motion limitations of 30 degrees external rotation, 60 degrees of  abduction, 90 degrees of forward flexion with OT.   She is on the following medications:  Prescriptions provided for Restoril,  Vicodin, Percocet and Robaxin.   She  may shower at 5 days postoperatively.  Call for any problems.      TAS/MEDQ  D:  09/14/2004  T:  09/14/2004  Job:  [id number]

## 2010-12-29 NOTE — Assessment & Plan Note (Signed)
[facility]                             PULMONARY OFFICE NOTE   [name], CINNAMON                MRN:          [id number]  DATE:11/12/2006                            DOB:          Jan 11, 1934    HISTORY OF PRESENT ILLNESS:  The patient is a 77 year old white female  patient of Dr. Maple Hudson who has a known history of asthmatic bronchitis,  allergic rhinitis, who presents for an acute office visit.  The patient  complains over the last week she has had progressively worsening  productive cough with thick green sputum, intermittent wheezing, fever,  chills, and loose stools.  The patient denies any hemoptysis, orthopnea,  PND, or leg swelling.  The patient recently reports that she was seen by  her primary care physician for some increased lower extremity edema, and  prescribed Lasix.   PAST MEDICAL HISTORY:  Reviewed.   CURRENT MEDICATIONS:  Reviewed.   PHYSICAL EXAM:  The patient is a pleasant female, in no acute distress.  She is afebrile with stable vital signs.  O2 saturation is 96% on room  air.  HEENT:  Appears unremarkable.  NECK:  Supple without cervical adenopathy.  No JVD.  LUNGS:  Sounds reveal coarse breath sounds without any wheezing or  crackles.  CARDIAC:  Irregular rate.  ABDOMEN:  Obese, soft, and nontender.  No hepatosplenomegaly.  EXTREMITIES:  Warm without any calf tenderness, cyanosis, clubbing.  There is venous insufficiency change and trace edema.   IMPRESSION AND PLAN:  1. Acute upper respiratory infection with a mild asthmatic flare.  The      patient is to begin Omnicef x7 days.  Add in Mucinex DM twice      daily, and was given a Xopenex nebulizer treatment in the office.      The patient is to return back with Dr. Maple Hudson as scheduled or sooner      if needed.      Rubye Oaks, NP  Electronically Signed      Clinton D. Maple Hudson, MD, Tonny Bollman, FACP  Electronically Signed   TP/MedQ  DD: 11/12/2006  DT:  11/12/2006  Job #: [id number]

## 2010-12-29 NOTE — Assessment & Plan Note (Signed)
[facility]                             PULMONARY OFFICE NOTE   [name], [name]                MRN:          [id number]  DATE:09/30/2006                            DOB:          [birth year]    PROBLEM:  1. Allergic rhinitis.  2. Asthmatic bronchitis.  3. Diabetes.  4. 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  5. Peripheral edema.  6. Exogenous obesity.   HISTORY:  One week of a viral type syndrome with shortness of breath,  congestion, cough, some productive green sputum, mild chills and  sleepiness.  No distinct fever.   MEDICATION:  1. Synthroid 0.025.  2. Benemid 500 mg b.i.d.  3. Tenormin.  4. Singulair 10 mg.  5. Zyrtec 10 mg.  6. Lovastatin 40 mg.  7. Bellamine 1 daily.  8. Avandamet 2/500 one b.i.d.  9. Verapamil 240 mg b.i.d.  10.Benicar 20 mg.  11.AcipHex 20 mg.  12.Symbicort 160/4.5.  13.The p.r.n. use of an albuterol inhaler.   DRUG INTOLERANCES:  To PENICILLIN, ERYTHROMYCIN, KEFLEX (edema), but can  take Cefdinir.   OBJECTIVE:  Weight 229 pounds, BP 148/60, pulse regular at 63, room air  saturation 94%.  She is obese.  Moderately congested cough, without  wheeze.  Mild hoarseness, without stridor.  Pharynx is not red.  There  is cerumen in the ear canals.  Mild nasal congestion.  No adenopathy.   IMPRESSION:  1. Rhinitis.  2. Exacerbation of asthmatic bronchitis.   PLAN:  1. Nebulizer Xopenex 1.25 mg.  2. Depo-Medrol 80 mg IM.  3. Biaxin 500 mg b.i.d. times seven days.  4. Fluids and rest.  5. Schedule return in four months, earlier p.r.n.   ADDENDUM:  She has called, saying Biaxin causes nausea, and she asks for  an alternative.  We will let her try Omnicef.     Clinton D. Maple Hudson, MD, Tonny Bollman, FACP  Electronically Signed    CDY/MedQ  DD: 10/01/2006  DT: 10/02/2006  Job #: [id number]

## 2010-12-29 NOTE — Assessment & Plan Note (Signed)
[facility]                               PULMONARY OFFICE NOTE   Terri, [name]                MRN:          [id number]  DATE:04/16/2006                            DOB:          November 26, 1933    PROBLEM LIST:  1. Allergic rhinitis.  2. Asthmatic bronchitis.  3. Diabetes.  4. 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  5. Peripheral edema.  6. Exogenous obesity.   HISTORY OF PRESENT ILLNESS:  She says her left leg still swells more than  her right. She has been traveling with a camper over the past weekend. She  is not definitely established with a fixed primary physician yet. Occasional  cough brings up just a little white mucous, sometimes trace yellow, but  definitely better than last time here. We discussed her previous experience  with Spiriva and Advair. She does not know why she came off of those. Chest  x-ray on November 30, 2005 showed cardiac enlargement without heart failure,  mild elevation of the right hemidiaphragm, which is stable, and no acute  abnormality.   OBJECTIVE:  VITAL SIGNS:  Weight up more to 223 pounds. Quite overweight.  She  LUNGS:  She had some rattling upper airway type of cough, which cleared  without bringing up visible mucous. No wheezes. No neck vein distention.  CARDIOVASCULAR:  Heart sounds were regular without edema. I found no  adenopathy.  EXTREMITIES:  There was 1+ edema in the legs, left slightly greater than  right with negative Homan's.   IMPRESSION:  Chronic bronchitis with asthma. Peripheral edema, probably on  the basis of obesity and venous insufficiency, but I cannot exclude a  component of cor pulmonale.   PLAN:  1. PFT.  2. Keep feet elevated.  3. Weight loss and salt restriction.  4. Try Symbicort 160/4.5 2 puffs b.i.d., given just a sample with      instruction.  5. Schedule return in 6 weeks, earlier p.r.n.  6. She is again encouraged to establish a definite primary  physician.                                   Clinton D. Maple Hudson, MD, FCCP, FACP   CDY/MedQ  DD:  04/16/2006  DT:  04/17/2006  Job #:  [id number]

## 2010-12-29 NOTE — Op Note (Signed)
NAMEALLIZON, [name]   MEDICAL RECORD NO.:  [id number]          PATIENT TYPE:  INP   LOCATION:  X002                         FACILITY:  [facility]   PHYSICIAN:  Vania Rea. Supple, M.D.  DATE OF BIRTH:  08/19/1933   DATE OF PROCEDURE:  07/20/2004  DATE OF DISCHARGE:                                 OPERATIVE REPORT   PREOPERATIVE DIAGNOSES:  End-stage right shoulder osteoarthrosis.   POSTOPERATIVE DIAGNOSES:  1.  End-stage right shoulder osteoarthrosis.  2.  Biceps tendinopathy.   PROCEDURE:  1.  Right shoulder Press-Fit hemiarthroplasty utilizing a DePuy global      implant size #12 stem with a 56 x 18 mm eccentric head.  2.  Biceps tendon tenodesis.   SURGEON:  Vania Rea. Supple, M.D.   Threasa HeadsFrench Ana A. Shuford, P.A.-C.   ANESTHESIA:  General endotracheal as well as a preoperative interscalene  block.   ESTIMATED BLOOD LOSS:  250 mL.   DRAINS:  None.   HISTORY:  [name] is a 77 year old female whose had chronic right  shoulder pain, weakness, limitations and motion with radiographic evidence  for end-stage arthrosis. Evidence for bone on bone contact.  She has been  treated with conservative management for a prolonged time and is now having  significant pain and deteriorating functional capabilities secondary to  arthrosis. She is brought to the operating room at this time for planned  right shoulder hemiarthroplasty versus total shoulder arthroplasty.   Preoperatively, I discussed with [name] the treatment options as  well as risks versus benefits thereof. Possible surgical complications of  bleeding, infection, neurovascular injury, persistent pain, loss of motion,  instability, loosening and possible need for revision are reviewed. She  understands and accepts and agrees with our planned procedure.   DESCRIPTION OF PROCEDURE:  After undergoing routine preop evaluation, the  patient received prophylactic antibiotics  and had an interscalene block  established in the holding area by the anesthesia department.  Placed supine  on the operating table and underwent smooth general endotracheal anesthesia.  Placed in the beach chair position and the right shoulder girdle region was  sterilely prepped and draped in standard fashion.  An incision was outlined  beginning just proximal to the coracoid process and extended distally and  laterally along the interval of the deltopectoral groove.  An incision was  then made sharply, a total length of approximately 15 cm.  Electrocautery  was used for hemostasis.  The subcutaneous fat was then divided and cephalic  vein was identified and retracted laterally with the deltoid and the  deltopectoral interval was then developed with the self retaining retractor  placed.  Multiple adhesions in the subacromial subdeltoid bursa were divided  as were adhesions beneath the conjoined tendon. We did perform a partial  tenotomy and a proximal conjoined at the coracoid to allow improved  visualization.  We did perform a partial tenotomy of the upper margin of the  pectoralis major also the improve visualization. Electrocautery was then  used to divide the insertion of the subscapularis from its lesser tuberosity  insertion and division was carried proximally at  the rotator interval and  then inferiorly to the inferior aspect of the humeral head with the anterior  humeral circumflex vessels electrocoagulated and then the subscapularis was  reflected medially. The anterior capsule was then divided from its anterior  glenoid insertion and then the subscapularis was circumferentially mobilized  and showed good elasticity of the tissue.  The free margin of the  subscapularis was then tagged with a series of #2 Ethibond sutures using a  grasping suture technique.  The biceps tendon was then exposed and freed  from the bicipital groove and then a biceps tendon tenotomy was performed at   the superior glenoid and the biceps was then trimmed and then tagged for  later biceps tendon tenodesis. The rotator cuff itself involving the  superior posterior elements was intact. There was a prominent humeral head  osteophyte and we did perform a __________ release anteriorly and inferiorly  with care taken to protect the axillary nerve. The capsule was freed from  the inferior aspect of the humeral head back to the 5 o'clock position and  then a rongeur was used to remove the osteophytes on the anterior humeral  head.  The glenoid was inspected and showed obvious denuding of articular  cartilage but the overall version was good and it was not felt that the  glenoid replacement was necessary.  Using the extramedullary guide, we then  marked the proposed cut on the humeral head and then maintaining the arm in  proper orientation, an oscillating saw was then used to osteotomize the  humeral head maintaining approximately 30 degrees of retroversion and care  taken to protect the rotator cuff. This head segment was then removed and  taken to the back table where it was measured and found to almost  identically match a 56 x 18 mm implant.  At this point, the intramedullary  canal finder was then passed down the humeral shaft and then sequential  reaming's were performed up to size 14. We then performed broachings making  sure that we matched the proper degree of retroversion of the implant.  Sequential broachings up to a size 12 were then completed. A size 14 was  considered but this was extremely tight in the humeral canal so we elected  to go with the size 12 implant.  Once the size 12 implant was impacted into  position, we confirmed that it had proper alignment with the cut on the  humeral neck and then trial reductions were performed with the 56/18 head  and this showed good coverage of the glenoid and proper positioning such that there was no impingement on the rotator cuff. We did  elect to go with  the eccentric head to allow improved coverage of the posterior and superior  aspects of the humerus and with the eccentric head there was excellent  reapproximation of the humeral head size to that which was removed.  Reduction was performed and showed approximately 50% translation of the  articular surface of the implant across the glenoid.  No obvious posterior  contractures or laxity.  Final irrigation was then completed. Cancellous  bone was removed from the resected humeral head and once the final implant  was opened and was being placed into position, the bone harvested from the  humeral head was then impacted around the stem prior to final impaction of  the stem. Once the bone was positioned, we impacted the stem obtaining  excellent bony interference fit.  The implant was then to be  flushed with  the osteotomy on the proximal humerus. Trial reductions were then again  completed and again the 56/18 head showed proper coverage and good soft  tissue balance. The final head implant was then opened, the trunnion was  meticulously cleaned. It was impacted into position maintaining proper  rotation of the eccentric head and showing excellent coverage and no  evidence for impingement against the rotator cuff. Final reduction was  performed.  There was excellent stability through full range of motion. We  did place a series of drill holes through the anterior margin of the humerus  and these were used to repair the subscapularis to the lesser tuberosity. We  also performed a biceps tenodesis suturing the biceps into the repair of the  subscapularis and then additional figure-of-eight side to side repair was  made between the subscapularis biceps and the adjacent anterior soft tissues  reinforcing the repair of the subscapularis.  The rotator interval was also  reapproximated with #2 Vicryl. Additional figure-of-eight sutures of  FiberWire were used to further reinforce the  subscapularis repair.  Once the  subscapularis repair was completed, irrigation was performed. Hemostasis was  obtained. The shoulder was taken through range of motion and easily achieved  30 degrees of external rotation without excessive tension on the  subscapularis repair. The arm could easily be placed palm on top of the head  and cross body abduction reaching the hand to the opposite shoulder.  The  deltopectoral interval was then allowed to close and 2-0 Vicryl was used for  the subcutaneous layer and  intracuticular 3-0 Monocryl for the skin followed by Steri-Strips. A dry  dressing was applied to the right shoulder, held in place with silk tape.  The right arm was placed into a shoulder immobilizer. The patient was placed  supine, extubated and taken to the recovery room in stable condition.     Sage.Pita   KMS/MEDQ  D:  07/20/2004  T:  07/20/2004  Job:  [id number]

## 2010-12-29 NOTE — Assessment & Plan Note (Signed)
[facility]                               PULMONARY OFFICE NOTE   AHRI, OLSON                MRN:          [id number]  DATE:05/28/2006                            DOB:          June 24, 1934    PROBLEMS:  1. Allergic rhinitis.  2. Asthmatic bronchitis.  3. Diabetes.  4. 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  5. Peripheral edema.  6. Exogenous obesity.   HISTORY:  Six week followup. She has had a pain under the right breast for  about four days, going through to the back and today she began coughing up  brown sputum. She says Symbicort has been quite good and her breathing has  been better so she could walk more. She is about to go to the beach. She has  had no fever or sore throat.   MEDICATIONS:  1. Synthroid 0.025 mg.  2. Benemid 500 mg b.i.d.  3. Tenormin.  4. Singulair 10 mg.  5. __________ 10 mg.  6. Lovastatin.  7. Bellamine 1 daily.  8. Avandamet 2/500 b.i.d.  9. Verapamil 240 mg b.i.d.  10.Benicar 20 mg.  11.AcipHex 200 mg.  12.Symbicort 160/4.5 2 puffs b.i.d.  13.Albuterol rescue inhaler p.r.n.   ALLERGIES:  Drug intolerant to:  PENICILLIN  ERYTHROMYCIN  KEFLEX   OBJECTIVE:  VITAL SIGNS: Weight 226 pounds, blood pressure 122/64, pulse  regular 53, room air saturation 93%.  LUNGS: There are few bilateral crackles, no rub.  Breathing is unlabored,  and she seems quite comfortable.  NECK: There is no neck vein distension.  EXTREMITIES: No peripheral edema on either side.  HEART: Heart sounds are regular without murmur or gallop.  GENERAL: She is obese, with shallow inspiratory effort consistent with body  build.   PULMONARY FUNCTION TEST:  Pulmonary functions showed normal spirometry flows  with insignificant response to bronchodilator. Normal measured lung volumes.  Diffusion capacity was moderately reduced at 55% of predicted.   IMPRESSION:  Recent acute bronchitis, obesity with hypoventilation.   PLAN:  1. Continue Symbicort 160/4.5.  2. Omnicef 300 mg b.i.d. for seven days.  3. She is encouraged to walk for weight loss and endurance.  4. Flu vaccine.  5. Scheduled to return in four months, earlier p.r.n.       Clinton D. Maple Hudson, MD, FCCP, FACP      CDY/MedQ  DD:  05/28/2006  DT:  05/30/2006  Job #:  [id number]

## 2011-01-03 ENCOUNTER — Encounter: Payer: Self-pay | Admitting: Internal Medicine

## 2011-01-10 ENCOUNTER — Ambulatory Visit: Payer: Medicare Other | Admitting: Physical Therapy

## 2011-01-11 ENCOUNTER — Encounter: Payer: Self-pay | Admitting: Internal Medicine

## 2011-01-11 ENCOUNTER — Ambulatory Visit (INDEPENDENT_AMBULATORY_CARE_PROVIDER_SITE_OTHER): Payer: Medicare Other | Admitting: Internal Medicine

## 2011-01-11 VITALS — BP 144/72 | HR 64 | Ht 61.0 in | Wt 215.6 lb

## 2011-01-11 DIAGNOSIS — G4733 Obstructive sleep apnea (adult) (pediatric): Secondary | ICD-10-CM

## 2011-01-11 DIAGNOSIS — J301 Allergic rhinitis due to pollen: Secondary | ICD-10-CM | POA: Insufficient documentation

## 2011-01-11 DIAGNOSIS — R0902 Hypoxemia: Secondary | ICD-10-CM

## 2011-01-11 DIAGNOSIS — E678 Other specified hyperalimentation: Secondary | ICD-10-CM

## 2011-01-11 DIAGNOSIS — J984 Other disorders of lung: Secondary | ICD-10-CM

## 2011-01-11 DIAGNOSIS — J309 Allergic rhinitis, unspecified: Secondary | ICD-10-CM

## 2011-01-11 MED ORDER — AZELASTINE HCL 0.1 % NA SOLN: 2.0000 | Freq: Every day | NASAL | Status: DC

## 2011-01-11 MED ORDER — MONTELUKAST SODIUM 10 MG PO TABS: 10.0000 mg | ORAL_TABLET | Freq: Every day | ORAL | Status: DC

## 2011-01-11 MED ORDER — ALBUTEROL SULFATE HFA 108 (90 BASE) MCG/ACT IN AERS: 2.0000 | INHALATION_SPRAY | RESPIRATORY_TRACT | Status: DC | PRN

## 2011-01-11 MED ORDER — CETIRIZINE HCL 10 MG PO TABS: 10.0000 mg | ORAL_TABLET | Freq: Every day | ORAL | Status: DC

## 2011-01-11 MED ORDER — BUDESONIDE-FORMOTEROL FUMARATE 160-4.5 MCG/ACT IN AERO: 2.0000 | INHALATION_SPRAY | Freq: Two times a day (BID) | RESPIRATORY_TRACT | Status: DC

## 2011-01-11 NOTE — Assessment & Plan Note (Signed)
Suggested trial of allegra 180/ fexofenadine.

## 2011-01-11 NOTE — Assessment & Plan Note (Addendum)
Good compliance and control. We will not change her settings.  16 cwp + O2 2 L/M Advanced

## 2011-01-11 NOTE — Assessment & Plan Note (Signed)
This looks like an active problem, but she shows no trend toward weight loss.

## 2011-01-11 NOTE — Progress Notes (Signed)
  Subjective:    Patient ID: [name], female    DOB: Jul 01, 1934, 77 y.o.   MRN: [id number]  HPI SATURATION QUALIFICATIONS:  Patient Saturations on Room Air while Ambulating = 83%  Patient Saturations on 2 Liters of oxygen while Ambulating = 96%Katie Welchel,CMA      Review of Systems     Objective:   Physical Exam        Assessment & Plan:

## 2011-01-11 NOTE — Progress Notes (Signed)
  Subjective:    Patient ID: [name], female    DOB: 09-02-1933, 77 y.o.   MRN: [id number]  HPI 01/11/11- 25 yo former smoker followed for OSA, OHS, occasional tracheobronchitis.Complicated by CAD/AFib, morbid obesity Last here Jun 15, 2010  - note reviewed. Denies significant changes. O2 dependent due to OHS. Complains of allergy with watering eyes and nose. Right ear hurts. Cardiologist added lisinopril for BP- discussed ACEI side effects.  Continues to use CPAP 16 every night with O2 2 L/M  Going to PT rehab to strengthen to reduce fall risk. Heat bothering her.  Review of Systems Constitutional:   No weight loss, night sweats,  Fevers, chills, fatigue, lassitude. HEENT:   No headaches,  Difficulty swallowing,  Tooth/dental problems,  Sore throat,                No sneezing, itching, , nasal congestion, post nasal drip,     Ear aches at times  CV:  No chest pain,  Orthopnea, PND, swelling in lower extremities, anasarca, dizziness, palpitations  GI  No heartburn, indigestion, abdominal pain, nausea, vomiting, diarrhea, change in bowel habits, loss of appetite  Resp:Stable shortness of breath.  No excess mucus, no productive cough,  No non-productive cough,  No coughing up of blood.  No change in color of mucus.  No wheezing.  No chest wall deformity  Skin: no rash or lesions.  GU: Mild dysuria, change in color of urine, No flank pain.  MS:  No joint pain or swelling.  No decreased range of motion.  No back pain.  Psych:  No change in mood or affect. No depression or anxiety.  No memory loss.       Objective:   Physical Exam General- Alert, Oriented, Affect-appropriate, Distress- none acute  Morbidly obese.  O2 on 2 L  Skin- rash-none, lesions- none, excoriation- none  Lymphadenopathy- none  Head- atraumatic  Eyes- Gross vision intact, PERRLA, conjunctivae clear secretions  Ears- Hearing, canals, Tm- normal to direct visualizatrion  Nose- Clear, No-Septal dev,  mucus, polyps, erosion, perforation   Throat- Mallampati II , mucosa clear , drainage- none, tonsils- atrophic.  Chewing gum  Neck- flexible , trachea midline, no stridor , thyroid nl, carotid no bruit  Chest - symmetrical excursion , unlabored     Heart/CV- RRR , no murmur , no gallop  , no rub, nl s1 s2                     - JVD- none , edema- none, stasis changes- none, varices- none     Lung- clear to P&A, wheeze- none, cough- none , dullness-none, rub- none     Chest wall-  Abd- tender-no, distended-no, bowel sounds-present, HSM- no  Br/ Gen/ Rectal- Not done, not indicated  Extrem- cyanosis- none, clubbing, none, atrophy- none, strength- nl  Neuro- grossly intact to observation         Assessment & Plan:

## 2011-01-11 NOTE — Assessment & Plan Note (Signed)
Suggested trial of allegra with discussion.

## 2011-01-11 NOTE — Patient Instructions (Signed)
Med refills  Try otc antihistamine fexofenadine 180/ 24 hour, once daily as needed for allergy. This is generic allegra.

## 2011-01-11 NOTE — Assessment & Plan Note (Signed)
CXR 11/31/11- no nodule, NAD

## 2011-01-16 ENCOUNTER — Ambulatory Visit: Payer: Medicare Other | Attending: Family Medicine | Admitting: Physical Therapy

## 2011-01-16 DIAGNOSIS — R262 Difficulty in walking, not elsewhere classified: Secondary | ICD-10-CM | POA: Insufficient documentation

## 2011-01-16 DIAGNOSIS — R5381 Other malaise: Secondary | ICD-10-CM | POA: Insufficient documentation

## 2011-01-16 DIAGNOSIS — IMO0001 Reserved for inherently not codable concepts without codable children: Secondary | ICD-10-CM | POA: Insufficient documentation

## 2011-01-16 DIAGNOSIS — R269 Unspecified abnormalities of gait and mobility: Secondary | ICD-10-CM | POA: Insufficient documentation

## 2011-01-18 ENCOUNTER — Encounter: Payer: Medicare Other | Admitting: Physical Therapy

## 2011-01-23 ENCOUNTER — Ambulatory Visit: Payer: Medicare Other | Admitting: Physical Therapy

## 2011-01-25 ENCOUNTER — Ambulatory Visit: Payer: Medicare Other | Admitting: Physical Therapy

## 2011-03-06 ENCOUNTER — Telehealth: Payer: Self-pay | Admitting: Internal Medicine

## 2011-03-06 NOTE — Telephone Encounter (Signed)
PATIENT RETURNED CALL PLEASE CALL BACK

## 2011-03-06 NOTE — Telephone Encounter (Signed)
[email or web address]  

## 2011-03-07 MED ORDER — AMOXICILLIN-POT CLAVULANATE 875-125 MG PO TABS: 1.0000 | ORAL_TABLET | Freq: Two times a day (BID) | ORAL | Status: AC

## 2011-03-07 NOTE — Telephone Encounter (Signed)
Spoke with pt. She states "have chest cold in right lung"- c/o chest and back pain x 3-4 days, prod cough with yellow/green sputum, and chills. I advised sounds like needs appt and offered her to see CDY tomorrow, she refused stating that she "does not feel up to coming in". Wants rx called in. Please advise, thanks! Allergies  Allergen Reactions  . Cefuroxime Axetil   . Cephalexin   . Clarithromycin   . Doxycycline     REACTION: rash on legs and feet, edema  . Erythromycin   . Tetracycline

## 2011-03-07 NOTE — Telephone Encounter (Signed)
Offer augmentin 875   # 14, 1 bid   No ref

## 2011-03-07 NOTE — Telephone Encounter (Signed)
Pt aware of Rx sent to pharmacy per CY.

## 2011-05-03 LAB — CBC
HCT: 24.9 — ABNORMAL LOW
HCT: 26.3 — ABNORMAL LOW
HCT: 27.9 — ABNORMAL LOW
HCT: 28.2 — ABNORMAL LOW
HCT: 28.8 — ABNORMAL LOW
Hemoglobin: 8.2 — ABNORMAL LOW
Hemoglobin: 8.7 — ABNORMAL LOW
Hemoglobin: 9.3 — ABNORMAL LOW
Hemoglobin: 9.5 — ABNORMAL LOW
MCHC: 32.5
MCHC: 33.1
MCHC: 33.1
MCHC: 33.3
MCHC: 33.7
MCHC: 33.8
MCV: 88.7
MCV: 90.1
MCV: 90.8
MCV: 91
Platelets: 251
Platelets: 260
Platelets: 268
Platelets: 287
Platelets: 385
Platelets: 387
Platelets: 407 — ABNORMAL HIGH
RBC: 2.47 — ABNORMAL LOW
RBC: 2.87 — ABNORMAL LOW
RBC: 2.88 — ABNORMAL LOW
RBC: 2.91 — ABNORMAL LOW
RBC: 2.93 — ABNORMAL LOW
RBC: 3.1 — ABNORMAL LOW
RDW: 16.3 — ABNORMAL HIGH
RDW: 15.9 — ABNORMAL HIGH
RDW: 16 — ABNORMAL HIGH
RDW: 16.4 — ABNORMAL HIGH
WBC: 5.9
WBC: 5.9
WBC: 6.1
WBC: 6.1
WBC: 6.3

## 2011-05-03 LAB — CROSSMATCH: Antibody Screen: NEGATIVE

## 2011-05-03 LAB — BASIC METABOLIC PANEL
BUN: 10
BUN: 20
BUN: 22
BUN: 24 — ABNORMAL HIGH
BUN: 24 — ABNORMAL HIGH
BUN: 26 — ABNORMAL HIGH
BUN: 29 — ABNORMAL HIGH
BUN: 31 — ABNORMAL HIGH
BUN: 9
CO2: 30
CO2: 31
CO2: 33 — ABNORMAL HIGH
CO2: 33 — ABNORMAL HIGH
CO2: 33 — ABNORMAL HIGH
CO2: 34 — ABNORMAL HIGH
CO2: 35 — ABNORMAL HIGH
CO2: 36 — ABNORMAL HIGH
Calcium: 8 — ABNORMAL LOW
Calcium: 8.2 — ABNORMAL LOW
Calcium: 8.6
Calcium: 8.9
Calcium: 9
Calcium: 9
Calcium: 9
Calcium: 9.3
Calcium: 9.4
Calcium: 9.7
Chloride: 100
Chloride: 87 — ABNORMAL LOW
Chloride: 88 — ABNORMAL LOW
Chloride: 88 — ABNORMAL LOW
Chloride: 89 — ABNORMAL LOW
Chloride: 89 — ABNORMAL LOW
Chloride: 91 — ABNORMAL LOW
Chloride: 92 — ABNORMAL LOW
Chloride: 97
Creatinine, Ser: 0.76
Creatinine, Ser: 0.77
Creatinine, Ser: 0.83
Creatinine, Ser: 0.85
Creatinine, Ser: 0.93
Creatinine, Ser: 1.05
Creatinine, Ser: 1.14
GFR calc Af Amer: >60
GFR calc Af Amer: >60
GFR calc Af Amer: >60
GFR calc Af Amer: >60
GFR calc Af Amer: >60
GFR calc Af Amer: >60
GFR calc Af Amer: >60
GFR calc non Af Amer: 47 — ABNORMAL LOW
GFR calc non Af Amer: 52 — ABNORMAL LOW
GFR calc non Af Amer: >60
GFR calc non Af Amer: >60
GFR calc non Af Amer: >60
GFR calc non Af Amer: >60
GFR calc non Af Amer: >60
Glucose, Bld: 147 — ABNORMAL HIGH
Glucose, Bld: 147 — ABNORMAL HIGH
Glucose, Bld: 159 — ABNORMAL HIGH
Glucose, Bld: 160 — ABNORMAL HIGH
Glucose, Bld: 170 — ABNORMAL HIGH
Glucose, Bld: 178 — ABNORMAL HIGH
Glucose, Bld: 186 — ABNORMAL HIGH
Glucose, Bld: 203 — ABNORMAL HIGH
Potassium: 3.6
Potassium: 3.9
Potassium: 3.9
Potassium: 4
Potassium: 4
Potassium: 4
Potassium: 4.2
Potassium: 4.6
Potassium: 4.7
Potassium: 4.7
Sodium: 130 — ABNORMAL LOW
Sodium: 131 — ABNORMAL LOW
Sodium: 131 — ABNORMAL LOW
Sodium: 131 — ABNORMAL LOW
Sodium: 133 — ABNORMAL LOW
Sodium: 135
Sodium: 136
Sodium: 137
Sodium: 139

## 2011-05-03 LAB — HEPATIC FUNCTION PANEL
AST: 18
Albumin: 3.7
Alkaline Phosphatase: 62
Total Protein: 7.9

## 2011-05-03 LAB — CK TOTAL AND CKMB (NOT AT ARMC)
CK, MB: 1
Relative Index: INVALID
Total CK: 38

## 2011-05-03 LAB — B-NATRIURETIC PEPTIDE (CONVERTED LAB)
Pro B Natriuretic peptide (BNP): 114 — ABNORMAL HIGH
Pro B Natriuretic peptide (BNP): 161 — ABNORMAL HIGH
Pro B Natriuretic peptide (BNP): 360 — ABNORMAL HIGH

## 2011-05-03 LAB — DIFFERENTIAL
Eosinophils Absolute: 0.1
Lymphocytes Relative: 19
Lymphs Abs: 1.2
Monocytes Relative: 7
Neutrophils Relative %: 72

## 2011-05-03 LAB — OCCULT BLOOD X 1 CARD TO LAB, STOOL
Fecal Occult Bld: POSITIVE
Fecal Occult Bld: POSITIVE

## 2011-05-03 LAB — RETICULOCYTES: Retic Ct Pct: 4.3 — ABNORMAL HIGH

## 2011-05-03 LAB — TROPONIN I: Troponin I: 0.01

## 2011-05-03 LAB — URINE MICROSCOPIC-ADD ON

## 2011-05-03 LAB — CARDIAC PANEL(CRET KIN+CKTOT+MB+TROPI)
CK, MB: 0.8
CK, MB: 0.8
Relative Index: INVALID
Relative Index: INVALID
Total CK: 21
Total CK: 35
Troponin I: 0.02
Troponin I: 0.01
Troponin I: 0.02
Troponin I: 0.04

## 2011-05-03 LAB — COMPREHENSIVE METABOLIC PANEL
BUN: 21
Calcium: 8.4
Glucose, Bld: 193 — ABNORMAL HIGH
Sodium: 133 — ABNORMAL LOW
Total Protein: 6.3

## 2011-05-03 LAB — HEMOGLOBIN AND HEMATOCRIT, BLOOD
HCT: 27.6 — ABNORMAL LOW
HCT: 29 — ABNORMAL LOW
Hemoglobin: 9.3 — ABNORMAL LOW
Hemoglobin: 9.3 — ABNORMAL LOW
Hemoglobin: 9.6 — ABNORMAL LOW

## 2011-05-03 LAB — ABO/RH: ABO/RH(D): A POS

## 2011-05-03 LAB — URINALYSIS, ROUTINE W REFLEX MICROSCOPIC
Bilirubin Urine: NEGATIVE
Glucose, UA: NEGATIVE
Nitrite: NEGATIVE
Specific Gravity, Urine: 1.012
pH: 7

## 2011-05-03 LAB — FOLATE: Folate: >20

## 2011-05-03 LAB — IRON AND TIBC
Saturation Ratios: 14 — ABNORMAL LOW
TIBC: 348
UIBC: 301

## 2011-05-03 LAB — APTT: aPTT: 30

## 2011-05-03 LAB — SAMPLE TO BLOOD BANK

## 2011-05-03 LAB — EXPECTORATED SPUTUM ASSESSMENT W GRAM STAIN, RFLX TO RESP C

## 2011-05-03 LAB — GLUCOSE, RANDOM: Glucose, Bld: 349 — ABNORMAL HIGH

## 2011-05-03 LAB — FERRITIN: Ferritin: 18 (ref 10–291)

## 2011-05-03 LAB — URINE CULTURE

## 2011-05-03 LAB — PROTIME-INR: Prothrombin Time: 13.5

## 2011-05-03 LAB — MAGNESIUM: Magnesium: 1.6

## 2011-05-22 LAB — CBC
HCT: 30.3 — ABNORMAL LOW
Hemoglobin: 10.2 — ABNORMAL LOW
Platelets: 318
WBC: 5.6

## 2011-05-22 LAB — BASIC METABOLIC PANEL
Calcium: 8.9
GFR calc non Af Amer: 55 — ABNORMAL LOW
Potassium: 4.1
Sodium: 134 — ABNORMAL LOW

## 2011-05-23 LAB — CBC
HCT: 31.5 — ABNORMAL LOW
Hemoglobin: 10.8 — ABNORMAL LOW
MCV: 90.4
Platelets: 277
WBC: 6.2

## 2011-05-23 LAB — BASIC METABOLIC PANEL
BUN: 16
Chloride: 100
Glucose, Bld: 153 — ABNORMAL HIGH
Potassium: 4.5
Sodium: 135

## 2011-07-13 ENCOUNTER — Encounter: Payer: Self-pay | Admitting: Internal Medicine

## 2011-07-13 ENCOUNTER — Ambulatory Visit (INDEPENDENT_AMBULATORY_CARE_PROVIDER_SITE_OTHER)
Admission: RE | Admit: 2011-07-13 | Discharge: 2011-07-13 | Disposition: A | Discharge status: Home / Self Care | Payer: Medicare Other | Source: Ambulatory Visit | Attending: Internal Medicine | Admitting: Internal Medicine

## 2011-07-13 ENCOUNTER — Ambulatory Visit (INDEPENDENT_AMBULATORY_CARE_PROVIDER_SITE_OTHER): Payer: Medicare Other | Admitting: Internal Medicine

## 2011-07-13 VITALS — BP 134/82 | HR 65 | Ht 60.0 in | Wt 205.0 lb

## 2011-07-13 DIAGNOSIS — R911 Solitary pulmonary nodule: Secondary | ICD-10-CM

## 2011-07-13 DIAGNOSIS — J984 Other disorders of lung: Secondary | ICD-10-CM

## 2011-07-13 DIAGNOSIS — J40 Bronchitis, not specified as acute or chronic: Secondary | ICD-10-CM

## 2011-07-13 DIAGNOSIS — G4733 Obstructive sleep apnea (adult) (pediatric): Secondary | ICD-10-CM

## 2011-07-13 DIAGNOSIS — E678 Other specified hyperalimentation: Secondary | ICD-10-CM

## 2011-07-13 MED ORDER — BUDESONIDE-FORMOTEROL FUMARATE 160-4.5 MCG/ACT IN AERO: 2.0000 | INHALATION_SPRAY | Freq: Two times a day (BID) | RESPIRATORY_TRACT | Status: DC

## 2011-07-13 MED ORDER — CETIRIZINE HCL 10 MG PO TABS: 10.0000 mg | ORAL_TABLET | Freq: Every day | ORAL | Status: DC

## 2011-07-13 MED ORDER — MONTELUKAST SODIUM 10 MG PO TABS: 10.0000 mg | ORAL_TABLET | Freq: Every day | ORAL | Status: DC

## 2011-07-13 MED ORDER — ALBUTEROL SULFATE HFA 108 (90 BASE) MCG/ACT IN AERS: 2.0000 | INHALATION_SPRAY | RESPIRATORY_TRACT | Status: DC | PRN

## 2011-07-13 MED ORDER — BENZONATATE 200 MG PO CAPS: 200.0000 mg | ORAL_CAPSULE | Freq: Three times a day (TID) | ORAL | Status: DC | PRN

## 2011-07-13 NOTE — Patient Instructions (Addendum)
Continue CPAP 16 with oxygen at 2 L  Order CXR  Dx hx of lung nodule  Meds refilled

## 2011-07-13 NOTE — Progress Notes (Signed)
Patient ID: [name], female    DOB: [birth year], 77 y.o.   MRN: [id number]  HPI 01/11/11- 77 yo former smoker followed for OSA, OHS, occasional tracheobronchitis.Complicated by CAD/AFib, morbid obesity Last here Jun 15, 2010  - note reviewed. Denies significant changes. O2 dependent due to OHS. Complains of allergy with watering eyes and nose. Right ear hurts. Cardiologist added lisinopril for BP- discussed ACEI side effects.  Continues to use CPAP 16 every night with O2 2 L/M  Going to PT rehab to strengthen to reduce fall risk. Heat bothering her.  07/13/11-  77 yo former smoker followed for OSA, OHS, occasional tracheobronchitis.Complicated by CAD/AFib, morbid obesity Has had flu vaccine. She fought off a cold last month. Feels that control of her sleep apnea is good as she continues CPAP 16 with 2 L of oxygen/Advanced. She has noticed her weight is dropping, but she says she is not trying. She does not feel there is something wrong. Frequent nasal drip. No cough or wheeze. CXR 07/13/11 IMPRESSION:  1. Cardiomegaly.  2. Bronchitic changes.  Original Report Authenticated By: Patterson Hammersmith, M.D.      Review of Systems-see HPI Constitutional:   No-   weight loss, night sweats, fevers, chills, fatigue, lassitude. HEENT:   No-  headaches, difficulty swallowing, tooth/dental problems, sore throat,       No-  sneezing, itching, ear ache, nasal congestion, post nasal drip,  CV:  No-   chest pain, orthopnea, PND, swelling in lower extremities, anasarca,                                  dizziness, palpitations Resp: No-   shortness of breath with exertion or at rest.              No-   productive cough,  No non-productive cough,  No- coughing up of blood.              No-   change in color of mucus.  No- wheezing.   Skin: No-   rash or lesions. GI:  No-   heartburn, indigestion, abdominal pain, nausea, vomiting, diarrhea,                 change in bowel habits, loss of  appetite GU: No-   dysuria, change in color of urine, no urgency or frequency.  No- flank pain. MS:  No-   joint pain or swelling.  No- decreased range of motion.  No- back pain. Neuro-     nothing unusual Psych:  No- change in mood or affect. No depression or anxiety.  No memory loss.   Objective:   Physical Exam General- Alert, Oriented, Affect-appropriate, Distress- none acute, still quite obese Skin- rash-none, lesions- none, excoriation- none Lymphadenopathy- none Head- atraumatic            Eyes- Gross vision intact, PERRLA, conjunctivae clear secretions            Ears- Hearing, canals-normal            Nose- Clear, no-Septal dev, mucus, polyps, erosion, perforation             Throat- Mallampati III-IV , mucosa clear , drainage- none, tonsils- atrophic Neck- flexible , trachea midline, no stridor , thyroid nl, carotid no bruit Chest - symmetrical excursion , unlabored           Heart/CV- RRR , no murmur ,  no gallop  , no rub, nl s1 s2                           - JVD- none , edema- none, stasis changes- none, varices- none           Lung- clear to P&A, wheeze- none, cough- none , dullness-none, rub- none           Chest wall-  Abd- tender-no, distended-no, bowel sounds-present, HSM- no Br/ Gen/ Rectal- Not done, not indicated Extrem- cyanosis- none, clubbing, none, atrophy- none, strength- nl Neuro- grossly intact to observation

## 2011-07-16 NOTE — Assessment & Plan Note (Signed)
Recent tracheobronchitis associated with a viral cold. We are refilling her medicines. CXR

## 2011-07-16 NOTE — Assessment & Plan Note (Signed)
Weight loss should help significantly if it could be obtained.

## 2011-07-16 NOTE — Assessment & Plan Note (Signed)
Good compliance and control. Weight loss will help. 

## 2011-07-26 NOTE — Progress Notes (Signed)
Quick Note:  Pt is aware of results. ______ 

## 2011-08-27 ENCOUNTER — Telehealth: Payer: Self-pay | Admitting: Internal Medicine

## 2011-08-27 NOTE — Telephone Encounter (Signed)
Paperwork and message on CY's cart.  

## 2011-08-28 NOTE — Telephone Encounter (Signed)
Copy of paperwork sent to medical records to scan in EPIC. Mailed original to patient as requested.

## 2012-01-11 ENCOUNTER — Ambulatory Visit (INDEPENDENT_AMBULATORY_CARE_PROVIDER_SITE_OTHER): Payer: Medicare Other | Admitting: Internal Medicine

## 2012-01-11 ENCOUNTER — Encounter: Payer: Self-pay | Admitting: Internal Medicine

## 2012-01-11 VITALS — BP 158/70 | HR 83 | Ht 60.0 in | Wt 196.2 lb

## 2012-01-11 DIAGNOSIS — J301 Allergic rhinitis due to pollen: Secondary | ICD-10-CM

## 2012-01-11 DIAGNOSIS — K219 Gastro-esophageal reflux disease without esophagitis: Secondary | ICD-10-CM

## 2012-01-11 DIAGNOSIS — E678 Other specified hyperalimentation: Secondary | ICD-10-CM

## 2012-01-11 DIAGNOSIS — R0902 Hypoxemia: Secondary | ICD-10-CM

## 2012-01-11 MED ORDER — BENZONATATE 200 MG PO CAPS: 200.0000 mg | ORAL_CAPSULE | Freq: Three times a day (TID) | ORAL | Status: AC | PRN

## 2012-01-11 MED ORDER — AZELASTINE HCL 0.1 % NA SOLN: 2.0000 | Freq: Every day | NASAL | Status: DC

## 2012-01-11 NOTE — Progress Notes (Signed)
Patient ID: [name], female    DOB: [birth year], 78 y.o.   MRN: [id number]  HPI 01/11/11- 31 yo former smoker followed for OSA, OHS, occasional tracheobronchitis.Complicated by CAD/AFib, morbid obesity Last here Jun 15, 2010  - note reviewed. Denies significant changes. O2 dependent due to OHS. Complains of allergy with watering eyes and nose. Right ear hurts. Cardiologist added lisinopril for BP- discussed ACEI side effects.  Continues to use CPAP 16 every night with O2 2 L/M  Going to PT rehab to strengthen to reduce fall risk. Heat bothering her.  07/13/11-  45 yo former smoker followed for OSA, OHS, occasional tracheobronchitis.Complicated by CAD/AFib, morbid obesity Has had flu vaccine. She fought off a cold last month. Feels that control of her sleep apnea is good as she continues CPAP 16 with 2 L of oxygen/Advanced. She has noticed her weight is dropping, but she says she is not trying. She does not feel there is something wrong. Frequent nasal drip. No cough or wheeze. CXR 07/13/11 IMPRESSION:  1. Cardiomegaly.  2. Bronchitic changes.  Original Report Authenticated By: Patterson Hammersmith, M.D.   01/11/12-  77 yo former smoker followed for OSA, OHS, occasional tracheobronchitis.Complicated by CAD/AFib, morbid obesity Has nights up able to sleep until 2-3am; CPAP every night for approx 8-9 hours Feels as though Zyrtec and singulair not helping as well right now. Having chest tightness last night and having uneasy "electric feelings" in chest.Cough-brown in color. Good compliance and control with CPAP 16/O2 2 L. Her sleep is occasionally delayed until 2 or 3 AM but she averages 8 or 9 hours of CPAP per night. Zyrtec and Singulair have been less help this spring with her seasonal allergic rhinitis. She is doing better now as the pollen season ends. Complains of "electric twinges" across her anterior chest. Admits reflux. Taking Tessalon for cough with deep breath. We discussed  reflux, cough and substernal discomfort as possibly related. We discussed her chest x-ray report from last visit.  Review of Systems-see HPI Constitutional:   No-   weight loss, night sweats, fevers, chills, fatigue, lassitude. HEENT:   No-  headaches, difficulty swallowing, tooth/dental problems, sore throat,       No-  sneezing, itching, ear ache, nasal congestion, post nasal drip,  CV:  No-  anginal chest pain, orthopnea, PND, swelling in lower extremities, anasarca, dizziness, palpitations Resp: +  shortness of breath with exertion or at rest.              No-   productive cough,  No non-productive cough,  No- coughing up of blood.              No-   change in color of mucus.  No- wheezing.   Skin: No-   rash or lesions. GI:  +  heartburn, indigestion, abdominal pain, nausea, vomiting, diarrhea,                 change in bowel habits, loss of appetite GU:  MS:  No-   joint pain or swelling.   Neuro-     nothing unusual Psych:  No- change in mood or affect. No depression or anxiety.  No memory loss.   Objective:   Physical Exam General- Alert, Oriented, Affect-appropriate, Distress- none acute, still quite obese, O2 2L/M, walker Skin- rash-none, lesions- none, excoriation- none Lymphadenopathy- none Head- atraumatic            Eyes- Gross vision intact, PERRLA, conjunctivae clear secretions  Ears- Hearing, canals-normal            Nose- Clear, no-Septal dev, mucus, polyps, erosion, perforation             Throat- Mallampati III-IV , mucosa clear , drainage- none, tonsils- atrophic Neck- flexible , trachea midline, no stridor , thyroid nl, carotid no bruit Chest - symmetrical excursion , unlabored           Heart/CV- RRR , no murmur , no gallop  , no rub, nl s1 s2                           - JVD- none , edema- none, stasis changes- none, varices- none           Lung- clear to P&A, wheeze- none, cough- none , dullness-none, rub- none           Chest wall-  Abd-  Br/ Gen/  Rectal- Not done, not indicated Extrem- cyanosis- none, clubbing, none, atrophy- none, strength- nl Neuro- grossly intact to observation

## 2012-01-11 NOTE — Patient Instructions (Signed)
Refill scripts for tessalon and for astelin  Please call as needed

## 2012-01-16 NOTE — Assessment & Plan Note (Signed)
We have re- emphasized reflux precautions.

## 2012-01-16 NOTE — Assessment & Plan Note (Signed)
This spring she needed more than routine antihistamine. We have refilled Astelin nasal spray and Tessalon.

## 2012-01-16 NOTE — Assessment & Plan Note (Signed)
Chronic hypoxic respiratory failure 2 to her obesity with crowding of the lower lung zones but no acute process. I continue to encourage weight loss. She will continue CPAP and oxygen at night, oxygen as needed during the day.

## 2012-01-16 NOTE — Assessment & Plan Note (Signed)
Controlled with supplemental oxygen. CPAP at night.

## 2012-02-15 ENCOUNTER — Telehealth: Payer: Self-pay | Admitting: Internal Medicine

## 2012-02-15 MED ORDER — MONTELUKAST SODIUM 10 MG PO TABS: 10.0000 mg | ORAL_TABLET | Freq: Every day | ORAL | Status: DC

## 2012-02-15 MED ORDER — CETIRIZINE HCL 10 MG PO TABS: 10.0000 mg | ORAL_TABLET | Freq: Every day | ORAL | Status: DC

## 2012-02-15 NOTE — Telephone Encounter (Signed)
RX's at front for pick up.

## 2012-02-15 NOTE — Telephone Encounter (Signed)
I spoke with pt and is requesting 90 day of singulair and zrtec to be picked up. I advised will print our rx and have CDY sign this. She states she will come by on Tuesday. Will forward to [facility] as an FYI once this is done

## 2012-02-21 ENCOUNTER — Telehealth: Payer: Self-pay | Admitting: Pulmonary Disease

## 2012-02-21 MED ORDER — AMOXICILLIN 500 MG PO CAPS: 500.0000 mg | ORAL_CAPSULE | Freq: Three times a day (TID) | ORAL | Status: AC

## 2012-02-21 NOTE — Telephone Encounter (Signed)
Spoke with patient about her symptoms.  Reports having pressure along her facial sinuses, some cold chills, nausea, and SOB when walking to bathroom.  Denies any fever, drainage, or chest congestion. Patient denies taking anything for her symptoms today. I Informed patient that we would inquire about Dr. Roxy Cedar rec's and get back with her.  Thank You

## 2012-02-21 NOTE — Telephone Encounter (Signed)
Suggest mucinex-D at pharmacy counter for the sinus pressure If she thinks she has a sinus infection, then we can call amoxacillin 500 mg, # 21, 1 three times daily

## 2012-02-21 NOTE — Telephone Encounter (Signed)
I spoke with pt and is aware of CDY recs. Pt states she believes she may have a sinus infection and would like rx called in for her. I have sent rx to the pharmacy and nothing further was needed

## 2012-08-04 ENCOUNTER — Ambulatory Visit: Payer: Medicare Other | Admitting: Internal Medicine

## 2012-09-08 ENCOUNTER — Ambulatory Visit (INDEPENDENT_AMBULATORY_CARE_PROVIDER_SITE_OTHER): Payer: Medicare Other | Admitting: Internal Medicine

## 2012-09-08 ENCOUNTER — Encounter: Payer: Self-pay | Admitting: Internal Medicine

## 2012-09-08 VITALS — BP 154/76 | HR 57 | Ht 60.0 in | Wt 193.4 lb

## 2012-09-08 DIAGNOSIS — J45901 Unspecified asthma with (acute) exacerbation: Secondary | ICD-10-CM

## 2012-09-08 DIAGNOSIS — J301 Allergic rhinitis due to pollen: Secondary | ICD-10-CM

## 2012-09-08 DIAGNOSIS — G4733 Obstructive sleep apnea (adult) (pediatric): Secondary | ICD-10-CM

## 2012-09-08 MED ORDER — AZELASTINE-FLUTICASONE 137-50 MCG/ACT NA SUSP: 2.0000 | Freq: Every day | NASAL | Status: DC

## 2012-09-08 NOTE — Progress Notes (Signed)
Patient ID: [name], female    DOB: 1933/09/14, 79 y.o.   MRN: [id number]  HPI 01/11/11- 8 yo former smoker followed for OSA, OHS, occasional tracheobronchitis.Complicated by CAD/AFib, morbid obesity Last here Jun 15, 2010  - note reviewed. Denies significant changes. O2 dependent due to OHS. Complains of allergy with watering eyes and nose. Right ear hurts. Cardiologist added lisinopril for BP- discussed ACEI side effects.  Continues to use CPAP 16 every night with O2 2 L/M  Going to PT rehab to strengthen to reduce fall risk. Heat bothering her.  07/13/11-  35 yo former smoker followed for OSA, OHS, occasional tracheobronchitis.Complicated by CAD/AFib, morbid obesity Has had flu vaccine. She fought off a cold last month. Feels that control of her sleep apnea is good as she continues CPAP 16 with 2 L of oxygen/Advanced. She has noticed her weight is dropping, but she says she is not trying. She does not feel there is something wrong. Frequent nasal drip. No cough or wheeze. CXR 07/13/11 IMPRESSION:  1. Cardiomegaly.  2. Bronchitic changes.  Original Report Authenticated By: Patterson Hammersmith, M.D.   01/11/12-  83 yo former smoker followed for OSA, OHS, occasional tracheobronchitis.Complicated by CAD/AFib, morbid obesity Has nights up able to sleep until 2-3am; CPAP every night for approx 8-9 hours Feels as though Zyrtec and singulair not helping as well right now. Having chest tightness last night and having uneasy "electric feelings" in chest.Cough-brown in color. Good compliance and control with CPAP 16/O2 2 L. Her sleep is occasionally delayed until 2 or 3 AM but she averages 8 or 9 hours of CPAP per night. Zyrtec and Singulair have been less help this spring with her seasonal allergic rhinitis. She is doing better now as the pollen season ends. Complains of "electric twinges" across her anterior chest. Admits reflux. Taking Tessalon for cough with deep breath. We discussed  reflux, cough and substernal discomfort as possibly related. We discussed her chest x-ray report from last visit.  09/08/12- 67 yo former smoker followed for OSA, OHS, occasional tracheobronchitis.Complicated by CAD/AFib, morbid obesity Has been breathing okay. Has not used her inhaler in a long time.  Being treated for stage IV kidney disease and being referred to nephrology. She says she has gradually lost 40 pounds. Our records indicate 11 pound weight loss since 2012. Complains of "allergy"-eyes water, nose runs despite Zyrtec and Singulair. Has continued oxygen 2 L with her CPAP 16/ Advanced. We discussed potential impact of weight loss..  Review of Systems-see HPI Constitutional:   No-   weight loss, night sweats, fevers, chills, fatigue, lassitude. HEENT:   No-  headaches, difficulty swallowing, tooth/dental problems, sore throat,       No-  sneezing, itching, ear ache, nasal congestion, post nasal drip,  CV:  No-  anginal chest pain, orthopnea, PND, swelling in lower extremities, anasarca, dizziness, palpitations Resp: +  shortness of breath with exertion or at rest.              No-   productive cough,  No non-productive cough,  No- coughing up of blood.              No-   change in color of mucus.  No- wheezing.   Skin: No-   rash or lesions. GI:  +  heartburn, indigestion, abdominal pain, nausea, vomiting,  GU:  MS:  No-   joint pain or swelling.   Neuro-     nothing unusual Psych:  No- change  in mood or affect. No depression or anxiety.  No memory loss.   Objective:   Physical Exam General- Alert, Oriented, Affect-appropriate, Distress- none acute, still quite obese, O2 2L/M, using a walker Skin- rash-none, lesions- none, excoriation- none Lymphadenopathy- none Head- atraumatic            Eyes- Gross vision intact, PERRLA, conjunctivae clear secretions, not injected            Ears- Hearing, canals-normal            Nose- Clear, no-Septal dev, mucus, polyps, erosion,  perforation             Throat- Mallampati III-IV , mucosa clear , drainage- none, tonsils- atrophic Neck- flexible , trachea midline, no stridor , thyroid nl, carotid no bruit Chest - symmetrical excursion , unlabored           Heart/CV- RRR , no murmur , no gallop  , no rub, nl s1 s2                           - JVD- none , edema- none, stasis changes- none, varices- none           Lung- clear to P&A, wheeze- none, cough- none , dullness-none, rub- none           Chest wall-  Abd-  Br/ Gen/ Rectal- Not done, not indicated Extrem- cyanosis- none, clubbing, none, atrophy- none, strength- nl Neuro- grossly intact to observation

## 2012-09-08 NOTE — Patient Instructions (Addendum)
Sample Dymista nasal spray   1-2 puffs each nostril every night at bedtime     Try this instead of astelin. If you like the sample we may send you flonase  Script.   Ok to continue oxygen 2L  Ok to continue CPAP

## 2012-09-17 NOTE — Assessment & Plan Note (Signed)
Good control as long as she can avoid winter virus infections.

## 2012-09-17 NOTE — Assessment & Plan Note (Signed)
She has history of allergic rhinitis at this time of year is less likely to have allergic problem. I discussed overdrying and viral infections is more likely alternatives. Recognizing that it may make overdrying worse, we are letting her try a sample of Dymista nasal spray.

## 2012-09-17 NOTE — Assessment & Plan Note (Signed)
Pressure is still at 16 with good control. She has continued to need supplemental oxygen with sleep.

## 2012-11-26 ENCOUNTER — Ambulatory Visit: Payer: Self-pay | Admitting: Obstetrics and Gynecology

## 2012-11-27 ENCOUNTER — Ambulatory Visit (INDEPENDENT_AMBULATORY_CARE_PROVIDER_SITE_OTHER): Payer: Medicare Other | Admitting: Obstetrics and Gynecology

## 2012-11-27 ENCOUNTER — Encounter: Payer: Self-pay | Admitting: Obstetrics and Gynecology

## 2012-11-27 VITALS — BP 150/70 | Ht 60.5 in | Wt 188.0 lb

## 2012-11-27 DIAGNOSIS — N952 Postmenopausal atrophic vaginitis: Secondary | ICD-10-CM

## 2012-11-27 NOTE — Patient Instructions (Addendum)
Atrophic Vaginitis Atrophic vaginitis is a problem of low levels of estrogen in women. This problem can happen at any age. It is most common in women who have gone through menopause ("the change").  HOW WILL I KNOW IF I HAVE THIS PROBLEM? You may have:  Trouble with peeing (urinating), such as:  Going to the bathroom often.  A hard time holding your pee until you reach a bathroom.  Leaking pee.  Having pain when you pee.  Itching or a burning feeling.  Vaginal bleeding and spotting.  Pain during sex.  Dryness of the vagina.  A yellow, bad-smelling fluid (discharge) coming from the vagina. HOW WILL MY DOCTOR CHECK FOR THIS PROBLEM?  During your exam, your doctor will likely find the problem.  If there is a vaginal fluid, it may be checked for infection. HOW WILL THIS PROBLEM BE TREATED? Keep the vulvar skin as clean as possible. Moisturizers and lubricants can help with some of the symptoms. Estrogen replacement can help. There are 2 ways to take estrogen:  Systemic estrogen gets estrogen to your whole body. It takes many weeks or months before the symptoms get better.  You take an estrogen pill.  You use a skin patch. This is a patch that you put on your skin.  If you still have your uterus, your doctor may ask you to take a hormone. Talk to your doctor about the right medicine for you.  Estrogen cream.  This puts estrogen only at the part of your body where you apply it. The cream is put into the vagina or put on the vulvar skin. For some women, estrogen cream works faster than pills or the patch. CAN ALL WOMEN WITH THIS PROBLEM USE ESTROGEN? No. Women with certain types of cancer, liver problems, or problems with blood clots should not take estrogen. Your doctor can help you decide the best treatment for your symptoms. Document Released: 01/16/2008 Document Revised: 10/22/2011 Document Reviewed: 01/16/2008 [employer] Patient Information 2013 [employer].  

## 2012-11-27 NOTE — Progress Notes (Signed)
Patient ID: [name], female   DOB: 05/28/1934, 79 y.o.   MRN: [id number]  SUBJECTIVE  Patient presents for Estring change.  No bleeding or spotting.  No discharge.  Without the Estring, patient has significant vaginal itching. Occasional vaginal pain that is fleeting.   Sees Dr. Hyman Hopes for renal care.  PCP is Dr. Uvaldo Rising.    No changes in general care.  Lost 5 pounds.    OBJECTIVE  General - Using a walker.  On oxygen. Pelvic - Atrophic external genitalia.  Estring removed without difficulty and discarded.  Patient has tight pubic arch.  Cervix and vagina without lesions.  Small and nontender uterus.  No adnexal masses or tenderness.  New Estring placed without difficulty.  Patient tolerated procedure well.  ASSESSMENT  Atrophic vaginitis.  Good response to Estring.    PLAN  Return to office in three months for Estring change.

## 2012-12-16 ENCOUNTER — Telehealth: Payer: Self-pay | Admitting: Obstetrics and Gynecology

## 2012-12-16 NOTE — Telephone Encounter (Signed)
Patient would like to change estring due to cost. Patient would like a new written prescription mailed to her home.

## 2012-12-16 NOTE — Telephone Encounter (Signed)
If she'll tell us which mail order she uses, we can send it directly to them and save her the trouble.

## 2012-12-16 NOTE — Telephone Encounter (Signed)
Spoke with pt who would like to stay on estring, but she is going to start using a mail order pharmacy to save money. Pt would like Dr. Tresa Res to send her a written RX through the mail. Pt has the paperwork at home to fill out and mail in.

## 2012-12-16 NOTE — Telephone Encounter (Signed)
LMTCB about mail order pharmacy.

## 2012-12-18 ENCOUNTER — Telehealth: Payer: Self-pay | Admitting: *Deleted

## 2012-12-18 NOTE — Telephone Encounter (Signed)
Patient was seen on by you 11/27/12 for an Estring change will she need to have this request filled [facility] has faxed a form for 2 mg for 90 day supply with 4 refills.

## 2012-12-18 NOTE — Telephone Encounter (Signed)
Hi Belinda,  I filled out the form and gave it to Triage to fax in.  Thanks,  [employer]

## 2013-02-27 ENCOUNTER — Encounter: Payer: Self-pay | Admitting: Obstetrics and Gynecology

## 2013-02-27 ENCOUNTER — Ambulatory Visit (INDEPENDENT_AMBULATORY_CARE_PROVIDER_SITE_OTHER): Payer: Medicare Other | Admitting: Obstetrics and Gynecology

## 2013-02-27 VITALS — BP 140/62 | HR 72 | Resp 16 | Wt 193.0 lb

## 2013-02-27 DIAGNOSIS — N951 Menopausal and female climacteric states: Secondary | ICD-10-CM

## 2013-02-27 NOTE — Patient Instructions (Addendum)
Return for estring change in 3 months.

## 2013-02-27 NOTE — Progress Notes (Signed)
79 yo MWF G0P0 here for estring change.  Pt has trouble removing it herself, so always comes in for changes.  She is using it for relief of vulvar and vaginal irritation/itching that occur if she does not use the estring.  She is having signif back pain recently which she says is from degenerative disc disease.  She has multiple medical problems, and uses ambulatory O2 and a walker.  Exam:  Overwt WF in obvious discomfort with movement     EXT, vag atrophic but nl for age.  Old estring removed and discarded.  New estring inserted.  F/u 3 months for estring change.  It will also be time for her AnEx.

## 2013-03-09 ENCOUNTER — Ambulatory Visit: Payer: Medicare Other | Admitting: Internal Medicine

## 2013-04-22 ENCOUNTER — Other Ambulatory Visit: Payer: Self-pay | Admitting: Interventional Cardiology

## 2013-04-22 DIAGNOSIS — R609 Edema, unspecified: Secondary | ICD-10-CM

## 2013-04-23 ENCOUNTER — Ambulatory Visit
Admission: RE | Admit: 2013-04-23 | Discharge: 2013-04-23 | Disposition: A | Discharge status: Home / Self Care | Payer: Medicare Other | Source: Ambulatory Visit | Attending: Interventional Cardiology | Admitting: Interventional Cardiology

## 2013-04-23 ENCOUNTER — Encounter: Payer: Self-pay | Admitting: Internal Medicine

## 2013-04-23 ENCOUNTER — Ambulatory Visit (INDEPENDENT_AMBULATORY_CARE_PROVIDER_SITE_OTHER): Payer: Medicare Other | Admitting: Internal Medicine

## 2013-04-23 VITALS — BP 126/60 | HR 68 | Ht 60.25 in | Wt 192.8 lb

## 2013-04-23 DIAGNOSIS — Z23 Encounter for immunization: Secondary | ICD-10-CM

## 2013-04-23 DIAGNOSIS — R609 Edema, unspecified: Secondary | ICD-10-CM

## 2013-04-23 DIAGNOSIS — G4733 Obstructive sleep apnea (adult) (pediatric): Secondary | ICD-10-CM

## 2013-04-23 DIAGNOSIS — E349 Endocrine disorder, unspecified: Secondary | ICD-10-CM

## 2013-04-23 DIAGNOSIS — E678 Other specified hyperalimentation: Secondary | ICD-10-CM

## 2013-04-23 MED ORDER — AZELASTINE HCL 0.1 % NA SOLN: 2.0000 | Freq: Every day | NASAL | Status: DC

## 2013-04-23 MED ORDER — MONTELUKAST SODIUM 10 MG PO TABS: 10.0000 mg | ORAL_TABLET | Freq: Every day | ORAL | Status: DC

## 2013-04-23 MED ORDER — CETIRIZINE HCL 10 MG PO TABS: 10.0000 mg | ORAL_TABLET | Freq: Every day | ORAL | Status: DC

## 2013-04-23 MED ORDER — ALBUTEROL SULFATE HFA 108 (90 BASE) MCG/ACT IN AERS: 2.0000 | INHALATION_SPRAY | RESPIRATORY_TRACT | Status: DC | PRN

## 2013-04-23 NOTE — Progress Notes (Signed)
Patient ID: [name], female    DOB: June 06, 1934, 79 y.o.   MRN: [id number]  HPI 01/11/11- 85 yo former smoker followed for OSA, OHS, occasional tracheobronchitis.Complicated by CAD/AFib, morbid obesity Last here Jun 15, 2010  - note reviewed. Denies significant changes. O2 dependent due to OHS. Complains of allergy with watering eyes and nose. Right ear hurts. Cardiologist added lisinopril for BP- discussed ACEI side effects.  Continues to use CPAP 16 every night with O2 2 L/M  Going to PT rehab to strengthen to reduce fall risk. Heat bothering her.  07/13/11-  26 yo former smoker followed for OSA, OHS, occasional tracheobronchitis.Complicated by CAD/AFib, morbid obesity Has had flu vaccine. She fought off a cold last month. Feels that control of her sleep apnea is good as she continues CPAP 16 with 2 L of oxygen/Advanced. She has noticed her weight is dropping, but she says she is not trying. She does not feel there is something wrong. Frequent nasal drip. No cough or wheeze. CXR 07/13/11 IMPRESSION:  1. Cardiomegaly.  2. Bronchitic changes.  Original Report Authenticated By: Patterson Hammersmith, M.D.   01/11/12-  59 yo former smoker followed for OSA, OHS, occasional tracheobronchitis.Complicated by CAD/AFib, morbid obesity Has nights up able to sleep until 2-3am; CPAP every night for approx 8-9 hours Feels as though Zyrtec and singulair not helping as well right now. Having chest tightness last night and having uneasy "electric feelings" in chest.Cough-brown in color. Good compliance and control with CPAP 16/O2 2 L. Her sleep is occasionally delayed until 2 or 3 AM but she averages 8 or 9 hours of CPAP per night. Zyrtec and Singulair have been less help this spring with her seasonal allergic rhinitis. She is doing better now as the pollen season ends. Complains of "electric twinges" across her anterior chest. Admits reflux. Taking Tessalon for cough with deep breath. We discussed  reflux, cough and substernal discomfort as possibly related. We discussed her chest x-ray report from last visit.  09/08/12- 32 yo former smoker followed for OSA, OHS, occasional tracheobronchitis.Complicated by CAD/AFib, morbid obesity Has been breathing okay. Has not used her inhaler in a long time.  Being treated for stage IV kidney disease and being referred to nephrology. She says she has gradually lost 40 pounds. Our records indicate 11 pound weight loss since 2012. Complains of "allergy"-eyes water, nose runs despite Zyrtec and Singulair. Has continued oxygen 2 L with her CPAP 16/ Advanced. We discussed potential impact of weight loss..  04/23/13-  69 yo former smoker followed for OSA, OHS, occasional tracheobronchitis.Complicated by CAD/AFib, morbid obesity CPAP 16/O2 2 L./ Advanced Weight has been stable around the 192 pounds this year. She is comfortable with her CPAP plus oxygen and is sleeping pretty well. Using a rolling walker. Recently treated for heart failure. Her PCP did CXR/ [facility],.  Review of Systems-see HPI Constitutional:   No-   weight loss, night sweats, fevers, chills, fatigue, lassitude. HEENT:   No-  headaches, difficulty swallowing, tooth/dental problems, sore throat,       No-  sneezing, itching, ear ache, nasal congestion, post nasal drip,  CV:  No-  anginal chest pain, orthopnea, PND, swelling in lower extremities, anasarca, dizziness, palpitations Resp: +  shortness of breath with exertion or at rest.              No-   productive cough,  No non-productive cough,  No- coughing up of blood.  No-   change in color of mucus.  No- wheezing.   Skin: +itching lower legs GI:  +  heartburn, indigestion, abdominal pain, nausea, vomiting,  GU:  MS:  No-   joint pain or swelling.   Neuro-     nothing unusual Psych:  No- change in mood or affect. No depression or anxiety.  No memory loss.   Objective:   Physical Exam General- Alert, Oriented,  Affect-appropriate, Distress- none acute, still quite obese, O2 2L/M, using a walker Skin- rash-none, lesions- none, excoriation- none Lymphadenopathy- none Head- atraumatic            Eyes- Gross vision intact, PERRLA, conjunctivae clear secretions, not injected            Ears- Hearing, canals-normal            Nose- Clear, no-Septal dev, mucus, polyps, erosion, perforation             Throat- Mallampati III-IV , mucosa clear , drainage- none, tonsils- atrophic Neck- flexible , trachea midline, no stridor , thyroid nl, carotid no bruit Chest - symmetrical excursion , unlabored           Heart/CV- RRR , no murmur , no gallop  , no rub, nl s1 s2                           - JVD- none , edema- none, stasis changes- none, varices- none           Lung- clear to P&A, wheeze- none, cough- none , dullness-none, rub- none           Chest wall-  Abd-  Br/ Gen/ Rectal- Not done, not indicated Extrem- cyanosis- none, clubbing, none, atrophy- none, strength- nl Neuro- grossly intact to observation

## 2013-04-23 NOTE — Patient Instructions (Addendum)
Flu vax  Refill scripts   We can continue CPAP 16/O2 2L/ Advanced  Please call as needed

## 2013-05-02 NOTE — Assessment & Plan Note (Signed)
Weight has stabilized but she is encouraged to try losing more

## 2013-05-02 NOTE — Assessment & Plan Note (Signed)
This is a function of her body weight as discussed

## 2013-05-02 NOTE — Assessment & Plan Note (Signed)
Good compliance and control. The pressure seems appropriate.

## 2013-06-04 ENCOUNTER — Ambulatory Visit (INDEPENDENT_AMBULATORY_CARE_PROVIDER_SITE_OTHER): Payer: Medicare Other | Admitting: Obstetrics and Gynecology

## 2013-06-04 ENCOUNTER — Encounter: Payer: Self-pay | Admitting: Obstetrics and Gynecology

## 2013-06-04 VITALS — BP 140/60 | HR 68 | Resp 14 | Ht 60.0 in | Wt 189.0 lb

## 2013-06-04 DIAGNOSIS — N952 Postmenopausal atrophic vaginitis: Secondary | ICD-10-CM

## 2013-06-04 NOTE — Progress Notes (Signed)
GYNECOLOGY VISIT  PCP: Dr. Gweneth Dimitri  Referring provider:   HPI: 79 y.o.   Married  Caucasian  female   G0P0 with No LMP recorded. Patient is postmenopausal.   here for  Change Estring Patient has spinal stenosis and significant back pain.  Doing injection therapy to try to get relief of pain.  Husband recovering from recent illness.  Hgb:  PCP Urine:  PCP  GYNECOLOGIC HISTORY: No LMP recorded. Patient is postmenopausal. Sexually active:  no Partner preference: female Contraception:   menopausal Menopausal hormone therapy: Estring DES exposure:   no Blood transfusions:  2009  Sexually transmitted diseases:   no GYN Procedures:   Mammogram: 2010 or 2011                Pap:   12/28/08 neg History of abnormal pap smear:  no   OB History   Grav Para Term Preterm Abortions TAB SAB Ect Mult Living   0              Obstetric Comments   2 adopted children       LIFESTYLE: Exercise:     no          Tobacco: no Alcohol: no Drug use:  no  OTHER HEALTH MAINTENANCE: Tetanus/TDap:less than 10 years Gardisil: no Influenza:  yes Zostavax: yes  Bone density:2010 or 2011 Osteoporosis Colonoscopy: 2009 (2) polyps  Cholesterol check: 2014 normal with medication  Family History  Problem Relation Age of Onset  . Pneumonia Mother   . Heart disease Mother   . Allergies Mother   . Emphysema Mother   . Arthritis Mother   . Heart attack Father   . Stroke Sister   . Diabetes Sister   . Heart disease Sister   . Diabetes Brother   . Diabetes      grandfather  . Clotting disorder Brother   . Arthritis Sister   . Breast cancer      aunt  . Bone cancer      aunt    Patient Active Problem List   Diagnosis Date Noted  . Allergic rhinitis due to pollen 01/11/2011  . OBSTRUCTIVE SLEEP APNEA 04/09/2010  . HYPOTHYROIDISM 02/09/2010  . HYPERTENSION 02/09/2010  . CAD 02/09/2010  . ATRIAL FIBRILLATION 02/09/2010  . BRADYCARDIA 02/09/2010  . CHF 02/09/2010  . RENAL  ARTERY STENOSIS 02/09/2010  . HYPOTENSION 02/09/2010  . COPD 02/09/2010  . GENERALIZED OSTEOARTHROSIS UNSPECIFIED SITE 02/09/2010  . OBESITY HYPOVENTILATION SYNDROME 12/01/2009  . PRURITUS 07/29/2009  . TRACHEOBRONCHITIS 09/28/2008  . RHINOSINUSITIS, ACUTE 11/07/2007  . LUNG NODULE 11/07/2007  . ANEMIA 08/25/2007  . Hypoxemia 08/25/2007  . DM 08/19/2007  . ENDOGENOUS OBESITY 08/19/2007  . PERIPHERAL EDEMA 08/19/2007  . ASTHMA, INTRINSIC, WITH ACUTE EXACERBATION 08/15/2007  . ACID REFLUX DISEASE 08/15/2007  . DYSPNEA 08/15/2007   Past Medical History  Diagnosis Date  . CAD (coronary artery disease)   . CHF (congestive heart failure)   . Paroxysmal atrial fibrillation   . Bradycardia   . Hypertension   . Renal artery stenosis   . COPD (chronic obstructive pulmonary disease)   . Obesity hypoventilation syndrome   . Pruritus   . Tracheobronchitis   . Acute rhinosinusitis   . Anemia   . Hypoxemia   . Peripheral edema   . Obesity, endogenous   . Diabetes mellitus   . Acid reflux disease   . Osteoarthritis, generalized   . Hypothyroidism   . Lung nodule  lingula  . OSA (obstructive sleep apnea)     NPSG 04/03/00--AHI 28.hr  . DVT (deep venous thrombosis) 2003    left leg  . Vaginal atrophy   . Hx of colonic polyps     Past Surgical History  Procedure Laterality Date  . Lumbar spine surgery    . Cholecystectomy    . Hemorroidectomy    . Repair wound fistula    . Right shoulder    . Elbow surgery    . Dilation and curettage of uterus    . Breast surgery  1998    lumpectomy  . Oophorectomy  1959    BSO-? ovarian cancer    ALLERGIES: Cefuroxime axetil; Cephalexin; Clarithromycin; Doxycycline; Erythromycin; and Tetracycline  Current Outpatient Prescriptions  Medication Sig Dispense Refill  . albuterol (VENTOLIN HFA) 108 (90 BASE) MCG/ACT inhaler Inhale 2 puffs into the lungs every 4 (four) hours as needed for wheezing or shortness of breath.  3 Inhaler  3  .  amLODipine (NORVASC) 5 MG tablet Take 5 mg by mouth daily.      Marland Kitchen azelastine (ASTELIN) 137 MCG/SPRAY nasal spray Place 2 sprays into the nose daily. Use in each nostril as directed  90 mL  3  . cetirizine (ZYRTEC) 10 MG tablet Take 1 tablet (10 mg total) by mouth daily.  90 tablet  3  . clopidogrel (PLAVIX) 75 MG tablet Take 75 mg by mouth daily.        . cyclobenzaprine (FLEXERIL) 10 MG tablet Once a day       . dextromethorphan-guaiFENesin (MUCINEX DM) 30-600 MG per 12 hr tablet As needed       . diltiazem (CARDIZEM) 120 MG tablet 2 tablets daily       . furosemide (LASIX) 40 MG tablet Take 80 mg by mouth 2 (two) times daily.       Marland Kitchen glimepiride (AMARYL) 4 MG tablet Once a day       . ibandronate (BONIVA) 150 MG tablet Take 150 mg by mouth every 30 (thirty) days. Take in the morning with a full glass of water, on an empty stomach, and do not take anything else by mouth or lie down for the next 30 min.       . insulin glargine (LANTUS) 100 UNIT/ML injection 13 units every morning and 35 units every evening      . isosorbide mononitrate (IMDUR) 30 MG 24 hr tablet Take 30 mg by mouth daily.        Marland Kitchen levothyroxine (SYNTHROID, LEVOTHROID) 50 MCG tablet 1/2 tablet mon, wed, fri and 1 whole tablet the other days       . lisinopril (PRINIVIL,ZESTRIL) 10 MG tablet Take 10 mg by mouth daily.        . Multiple Vitamin (MULTIVITAMIN PO) Once a day       . nitroGLYCERIN (NITROSTAT) 0.4 MG SL tablet As needed       . omeprazole (PRILOSEC OTC) 20 MG tablet Take one 3-60 minutes before 1st and last meals of the day       . oxybutynin (DITROPAN XL) 10 MG 24 hr tablet Take 10 mg by mouth daily.        . OxyCODONE (OXYCONTIN) 10 mg T12A 12 hr tablet Take 10 mg by mouth every 12 (twelve) hours.      . Prenatal Vit-Fe Sulfate-FA (PRENATAL VITAMIN PO) Once a day       . sotalol (BETAPACE) 80 MG tablet Take 1 1/2 tablets  by mouth 2 times a day       . budesonide-formoterol (SYMBICORT) 160-4.5 MCG/ACT inhaler Inhale  2 puffs into the lungs 2 (two) times daily.  3 Inhaler  3  . montelukast (SINGULAIR) 10 MG tablet Take 1 tablet (10 mg total) by mouth at bedtime.  90 tablet  3   No current facility-administered medications for this visit.     ROS:  Pertinent items are noted in HPI.  SOCIAL HISTORY:  Married.   PHYSICAL EXAMINATION:    BP 140/60  Pulse 68  Resp 14  Ht 5' (1.524 m)  Wt 189 lb (85.73 kg)  BMI 36.91 kg/m2   Wt Readings from Last 3 Encounters:  06/04/13 189 lb (85.73 kg)  04/23/13 192 lb 12.8 oz (87.454 kg)  02/27/13 193 lb (87.544 kg)     Ht Readings from Last 3 Encounters:  06/04/13 5' (1.524 m)  04/23/13 5' 0.25" (1.53 m)  11/27/12 5' 0.5" (1.537 m)    General appearance: alert, cooperative and appears stated age.  Tearful.  Has a walker and oxygen tank. Abdomen: obese, soft, non-tender; no masses,  no organomegaly   Pelvic: External genitalia:  no lesions              Estring removed and discarded.              New Estring replaced without difficulty.                ASSESSMENT  Vaginal atrophy. Using Estring   PLAN  Return in three months for annual exam and discussion of plan regarding further use of Estring.    An After Visit Summary was printed and given to the patient.

## 2013-06-04 NOTE — Patient Instructions (Signed)
I will see you in three months to change your Estring and do your check up!

## 2013-06-07 ENCOUNTER — Encounter (HOSPITAL_COMMUNITY): Payer: Self-pay | Admitting: Emergency Medicine

## 2013-06-07 ENCOUNTER — Emergency Department (HOSPITAL_COMMUNITY): Payer: Medicare Other

## 2013-06-07 ENCOUNTER — Inpatient Hospital Stay (HOSPITAL_COMMUNITY)
Admission: EM | Admit: 2013-06-07 | Discharge: 2013-06-16 | DRG: 291 | Disposition: A | Discharge status: Skilled Nursing Facility | Payer: Medicare Other | Attending: Internal Medicine | Admitting: Internal Medicine

## 2013-06-07 ENCOUNTER — Inpatient Hospital Stay (HOSPITAL_COMMUNITY): Payer: Medicare Other

## 2013-06-07 DIAGNOSIS — I701 Atherosclerosis of renal artery: Secondary | ICD-10-CM

## 2013-06-07 DIAGNOSIS — M79671 Pain in right foot: Secondary | ICD-10-CM

## 2013-06-07 DIAGNOSIS — J811 Chronic pulmonary edema: Secondary | ICD-10-CM

## 2013-06-07 DIAGNOSIS — R0609 Other forms of dyspnea: Secondary | ICD-10-CM

## 2013-06-07 DIAGNOSIS — I959 Hypotension, unspecified: Secondary | ICD-10-CM

## 2013-06-07 DIAGNOSIS — I498 Other specified cardiac arrhythmias: Secondary | ICD-10-CM

## 2013-06-07 DIAGNOSIS — J440 Chronic obstructive pulmonary disease with acute lower respiratory infection: Secondary | ICD-10-CM | POA: Diagnosis present

## 2013-06-07 DIAGNOSIS — J4489 Other specified chronic obstructive pulmonary disease: Secondary | ICD-10-CM

## 2013-06-07 DIAGNOSIS — I2789 Other specified pulmonary heart diseases: Secondary | ICD-10-CM | POA: Diagnosis present

## 2013-06-07 DIAGNOSIS — I509 Heart failure, unspecified: Secondary | ICD-10-CM

## 2013-06-07 DIAGNOSIS — J449 Chronic obstructive pulmonary disease, unspecified: Secondary | ICD-10-CM

## 2013-06-07 DIAGNOSIS — Z79899 Other long term (current) drug therapy: Secondary | ICD-10-CM

## 2013-06-07 DIAGNOSIS — E039 Hypothyroidism, unspecified: Secondary | ICD-10-CM

## 2013-06-07 DIAGNOSIS — J301 Allergic rhinitis due to pollen: Secondary | ICD-10-CM

## 2013-06-07 DIAGNOSIS — J9610 Chronic respiratory failure, unspecified whether with hypoxia or hypercapnia: Secondary | ICD-10-CM | POA: Diagnosis present

## 2013-06-07 DIAGNOSIS — I129 Hypertensive chronic kidney disease with stage 1 through stage 4 chronic kidney disease, or unspecified chronic kidney disease: Secondary | ICD-10-CM | POA: Diagnosis present

## 2013-06-07 DIAGNOSIS — E349 Endocrine disorder, unspecified: Secondary | ICD-10-CM

## 2013-06-07 DIAGNOSIS — E876 Hypokalemia: Secondary | ICD-10-CM

## 2013-06-07 DIAGNOSIS — Z86718 Personal history of other venous thrombosis and embolism: Secondary | ICD-10-CM

## 2013-06-07 DIAGNOSIS — I5033 Acute on chronic diastolic (congestive) heart failure: Principal | ICD-10-CM

## 2013-06-07 DIAGNOSIS — J40 Bronchitis, not specified as acute or chronic: Secondary | ICD-10-CM

## 2013-06-07 DIAGNOSIS — T502X5A Adverse effect of carbonic-anhydrase inhibitors, benzothiadiazides and other diuretics, initial encounter: Secondary | ICD-10-CM | POA: Diagnosis present

## 2013-06-07 DIAGNOSIS — E119 Type 2 diabetes mellitus without complications: Secondary | ICD-10-CM

## 2013-06-07 DIAGNOSIS — I5031 Acute diastolic (congestive) heart failure: Secondary | ICD-10-CM | POA: Insufficient documentation

## 2013-06-07 DIAGNOSIS — J018 Other acute sinusitis: Secondary | ICD-10-CM

## 2013-06-07 DIAGNOSIS — M159 Polyosteoarthritis, unspecified: Secondary | ICD-10-CM

## 2013-06-07 DIAGNOSIS — Z7901 Long term (current) use of anticoagulants: Secondary | ICD-10-CM

## 2013-06-07 DIAGNOSIS — G4733 Obstructive sleep apnea (adult) (pediatric): Secondary | ICD-10-CM

## 2013-06-07 DIAGNOSIS — I4891 Unspecified atrial fibrillation: Secondary | ICD-10-CM

## 2013-06-07 DIAGNOSIS — J984 Other disorders of lung: Secondary | ICD-10-CM

## 2013-06-07 DIAGNOSIS — Z794 Long term (current) use of insulin: Secondary | ICD-10-CM

## 2013-06-07 DIAGNOSIS — J45901 Unspecified asthma with (acute) exacerbation: Secondary | ICD-10-CM

## 2013-06-07 DIAGNOSIS — D649 Anemia, unspecified: Secondary | ICD-10-CM

## 2013-06-07 DIAGNOSIS — R609 Edema, unspecified: Secondary | ICD-10-CM

## 2013-06-07 DIAGNOSIS — N183 Chronic kidney disease, stage 3 unspecified: Secondary | ICD-10-CM | POA: Diagnosis present

## 2013-06-07 DIAGNOSIS — J189 Pneumonia, unspecified organism: Secondary | ICD-10-CM

## 2013-06-07 DIAGNOSIS — E1165 Type 2 diabetes mellitus with hyperglycemia: Secondary | ICD-10-CM | POA: Diagnosis present

## 2013-06-07 DIAGNOSIS — Z6835 Body mass index (BMI) 35.0-35.9, adult: Secondary | ICD-10-CM

## 2013-06-07 DIAGNOSIS — L299 Pruritus, unspecified: Secondary | ICD-10-CM

## 2013-06-07 DIAGNOSIS — E662 Morbid (severe) obesity with alveolar hypoventilation: Secondary | ICD-10-CM | POA: Diagnosis present

## 2013-06-07 DIAGNOSIS — E871 Hypo-osmolality and hyponatremia: Secondary | ICD-10-CM

## 2013-06-07 DIAGNOSIS — M109 Gout, unspecified: Secondary | ICD-10-CM | POA: Diagnosis present

## 2013-06-07 DIAGNOSIS — K219 Gastro-esophageal reflux disease without esophagitis: Secondary | ICD-10-CM

## 2013-06-07 DIAGNOSIS — Z9981 Dependence on supplemental oxygen: Secondary | ICD-10-CM

## 2013-06-07 DIAGNOSIS — E678 Other specified hyperalimentation: Secondary | ICD-10-CM

## 2013-06-07 DIAGNOSIS — J441 Chronic obstructive pulmonary disease with (acute) exacerbation: Secondary | ICD-10-CM

## 2013-06-07 DIAGNOSIS — I10 Essential (primary) hypertension: Secondary | ICD-10-CM

## 2013-06-07 DIAGNOSIS — R0902 Hypoxemia: Secondary | ICD-10-CM

## 2013-06-07 DIAGNOSIS — I2510 Atherosclerotic heart disease of native coronary artery without angina pectoris: Secondary | ICD-10-CM

## 2013-06-07 DIAGNOSIS — IMO0002 Reserved for concepts with insufficient information to code with codable children: Secondary | ICD-10-CM

## 2013-06-07 DIAGNOSIS — J209 Acute bronchitis, unspecified: Secondary | ICD-10-CM | POA: Diagnosis present

## 2013-06-07 HISTORY — DX: Reserved for concepts with insufficient information to code with codable children: IMO0002

## 2013-06-07 HISTORY — DX: Acute diastolic (congestive) heart failure: I50.31

## 2013-06-07 LAB — CBC WITH DIFFERENTIAL/PLATELET
Basophils Relative: 0 % (ref 0–1)
Eosinophils Relative: 0 % (ref 0–5)
HCT: 31.1 % — ABNORMAL LOW (ref 36.0–46.0)
Hemoglobin: 10.4 g/dL — ABNORMAL LOW (ref 12.0–15.0)
MCH: 29.1 pg (ref 26.0–34.0)
MCHC: 33.4 g/dL (ref 30.0–36.0)
MCV: 86.9 fL (ref 78.0–100.0)
Monocytes Absolute: 0.6 10*3/uL (ref 0.1–1.0)
Monocytes Relative: 5 % (ref 3–12)
Neutro Abs: 9.6 10*3/uL — ABNORMAL HIGH (ref 1.7–7.7)
RBC: 3.58 MIL/uL — ABNORMAL LOW (ref 3.87–5.11)

## 2013-06-07 LAB — CG4 I-STAT (LACTIC ACID): Lactic Acid, Venous: 1.02 mmol/L (ref 0.5–2.2)

## 2013-06-07 LAB — BASIC METABOLIC PANEL
BUN: 33 mg/dL — ABNORMAL HIGH (ref 6–23)
CO2: 23 mEq/L (ref 19–32)
Calcium: 9.4 mg/dL (ref 8.4–10.5)
Chloride: 96 mEq/L (ref 96–112)
Creatinine, Ser: 1.48 mg/dL — ABNORMAL HIGH (ref 0.50–1.10)
GFR calc Af Amer: 38 mL/min — ABNORMAL LOW (ref >90)
GFR calc non Af Amer: 33 mL/min — ABNORMAL LOW (ref >90)
Sodium: 133 mEq/L — ABNORMAL LOW (ref 135–145)

## 2013-06-07 LAB — URINALYSIS, ROUTINE W REFLEX MICROSCOPIC
Bilirubin Urine: NEGATIVE
Glucose, UA: NEGATIVE mg/dL
Ketones, ur: NEGATIVE mg/dL
Protein, ur: >300 mg/dL — AB

## 2013-06-07 LAB — TROPONIN I
Troponin I: <0.3 ng/mL (ref <0.30)
Troponin I: <0.3 ng/mL (ref <0.30)

## 2013-06-07 LAB — GLUCOSE, CAPILLARY
Glucose-Capillary: 140 mg/dL — ABNORMAL HIGH (ref 70–99)
Glucose-Capillary: 159 mg/dL — ABNORMAL HIGH (ref 70–99)

## 2013-06-07 LAB — URINE MICROSCOPIC-ADD ON

## 2013-06-07 LAB — POCT I-STAT TROPONIN I

## 2013-06-07 MED ORDER — OXYCODONE HCL ER 10 MG PO T12A
10.0000 mg | EXTENDED_RELEASE_TABLET | Freq: Two times a day (BID) | ORAL | Status: DC
  Administered 2013-06-07 – 2013-06-15 (×16): 10 mg via ORAL
  Filled 2013-06-07 (×16): qty 1

## 2013-06-07 MED ORDER — INSULIN GLARGINE 100 UNIT/ML ~~LOC~~ SOLN
13.0000 [IU] | Freq: Every day | SUBCUTANEOUS | Status: DC
  Administered 2013-06-08 – 2013-06-16 (×9): 13 [IU] via SUBCUTANEOUS
  Filled 2013-06-07 (×13): qty 0.13

## 2013-06-07 MED ORDER — LEVOTHYROXINE SODIUM 50 MCG PO TABS
50.0000 ug | ORAL_TABLET | Freq: Every day | ORAL | Status: DC
  Administered 2013-06-08 – 2013-06-16 (×9): 50 ug via ORAL
  Filled 2013-06-07 (×11): qty 1

## 2013-06-07 MED ORDER — LISINOPRIL 10 MG PO TABS
10.0000 mg | ORAL_TABLET | Freq: Every day | ORAL | Status: DC
  Administered 2013-06-07 – 2013-06-16 (×10): 10 mg via ORAL
  Filled 2013-06-07 (×10): qty 1

## 2013-06-07 MED ORDER — FUROSEMIDE 10 MG/ML IJ SOLN
40.0000 mg | Freq: Once | INTRAMUSCULAR | Status: AC
  Administered 2013-06-07: 40 mg via INTRAVENOUS
  Filled 2013-06-07: qty 4

## 2013-06-07 MED ORDER — LEVOFLOXACIN IN D5W 750 MG/150ML IV SOLN
750.0000 mg | INTRAVENOUS | Status: AC
  Administered 2013-06-07: 750 mg via INTRAVENOUS
  Filled 2013-06-07: qty 150

## 2013-06-07 MED ORDER — ASPIRIN 81 MG PO CHEW
324.0000 mg | CHEWABLE_TABLET | Freq: Once | ORAL | Status: AC
  Administered 2013-06-07: 324 mg via ORAL
  Filled 2013-06-07: qty 4

## 2013-06-07 MED ORDER — MORPHINE SULFATE 4 MG/ML IJ SOLN
4.0000 mg | Freq: Once | INTRAMUSCULAR | Status: AC
  Administered 2013-06-07: 4 mg via INTRAVENOUS
  Filled 2013-06-07: qty 1

## 2013-06-07 MED ORDER — POTASSIUM CHLORIDE 10 MEQ/100ML IV SOLN
10.0000 meq | INTRAVENOUS | Status: AC
  Administered 2013-06-07 (×5): 10 meq via INTRAVENOUS
  Filled 2013-06-07 (×5): qty 100

## 2013-06-07 MED ORDER — MONTELUKAST SODIUM 10 MG PO TABS
10.0000 mg | ORAL_TABLET | Freq: Every day | ORAL | Status: DC
  Administered 2013-06-07 – 2013-06-15 (×9): 10 mg via ORAL
  Filled 2013-06-07 (×10): qty 1

## 2013-06-07 MED ORDER — FUROSEMIDE 10 MG/ML IJ SOLN
60.0000 mg | Freq: Two times a day (BID) | INTRAMUSCULAR | Status: DC
  Filled 2013-06-07: qty 6

## 2013-06-07 MED ORDER — LEVOFLOXACIN IN D5W 750 MG/150ML IV SOLN
750.0000 mg | INTRAVENOUS | Status: DC
  Filled 2013-06-07: qty 150

## 2013-06-07 MED ORDER — BUDESONIDE-FORMOTEROL FUMARATE 160-4.5 MCG/ACT IN AERO
2.0000 | INHALATION_SPRAY | Freq: Two times a day (BID) | RESPIRATORY_TRACT | Status: DC
  Administered 2013-06-08 – 2013-06-16 (×17): 2 via RESPIRATORY_TRACT
  Filled 2013-06-07 (×2): qty 6

## 2013-06-07 MED ORDER — ACETAMINOPHEN 325 MG PO TABS
650.0000 mg | ORAL_TABLET | Freq: Four times a day (QID) | ORAL | Status: DC | PRN
  Administered 2013-06-07 – 2013-06-09 (×4): 650 mg via ORAL
  Filled 2013-06-07 (×4): qty 2

## 2013-06-07 MED ORDER — PANTOPRAZOLE SODIUM 40 MG PO TBEC
40.0000 mg | DELAYED_RELEASE_TABLET | Freq: Every day | ORAL | Status: DC
  Administered 2013-06-07 – 2013-06-16 (×10): 40 mg via ORAL
  Filled 2013-06-07 (×10): qty 1

## 2013-06-07 MED ORDER — SOTALOL HCL 120 MG PO TABS
120.0000 mg | ORAL_TABLET | Freq: Two times a day (BID) | ORAL | Status: DC
  Administered 2013-06-07 – 2013-06-09 (×4): 120 mg via ORAL
  Filled 2013-06-07 (×7): qty 1

## 2013-06-07 MED ORDER — ISOSORBIDE MONONITRATE ER 30 MG PO TB24
30.0000 mg | ORAL_TABLET | Freq: Every day | ORAL | Status: DC
  Administered 2013-06-07 – 2013-06-16 (×10): 30 mg via ORAL
  Filled 2013-06-07 (×11): qty 1

## 2013-06-07 MED ORDER — HEPARIN SODIUM (PORCINE) 5000 UNIT/ML IJ SOLN
5000.0000 [IU] | Freq: Two times a day (BID) | INTRAMUSCULAR | Status: DC
  Administered 2013-06-07 – 2013-06-16 (×18): 5000 [IU] via SUBCUTANEOUS
  Filled 2013-06-07 (×21): qty 1

## 2013-06-07 MED ORDER — SODIUM CHLORIDE 0.9 % IV SOLN
1000.0000 mL | INTRAVENOUS | Status: DC
  Administered 2013-06-07: 1000 mL via INTRAVENOUS

## 2013-06-07 MED ORDER — AMLODIPINE BESYLATE 5 MG PO TABS
5.0000 mg | ORAL_TABLET | Freq: Every day | ORAL | Status: DC
  Administered 2013-06-07 – 2013-06-13 (×7): 5 mg via ORAL
  Filled 2013-06-07 (×7): qty 1

## 2013-06-07 MED ORDER — ONDANSETRON HCL 4 MG PO TABS: 4.0000 mg | ORAL_TABLET | Freq: Four times a day (QID) | ORAL | Status: DC | PRN

## 2013-06-07 MED ORDER — INSULIN ASPART 100 UNIT/ML ~~LOC~~ SOLN
0.0000 [IU] | Freq: Three times a day (TID) | SUBCUTANEOUS | Status: DC
  Administered 2013-06-07: 1 [IU] via SUBCUTANEOUS
  Administered 2013-06-08 (×2): 2 [IU] via SUBCUTANEOUS
  Administered 2013-06-08 – 2013-06-09 (×2): 1 [IU] via SUBCUTANEOUS
  Administered 2013-06-09: 09:00:00 2 [IU] via SUBCUTANEOUS
  Administered 2013-06-09: 1 [IU] via SUBCUTANEOUS
  Administered 2013-06-10: 2 [IU] via SUBCUTANEOUS
  Administered 2013-06-10 – 2013-06-11 (×2): 1 [IU] via SUBCUTANEOUS
  Administered 2013-06-11 – 2013-06-12 (×3): 2 [IU] via SUBCUTANEOUS
  Administered 2013-06-12 – 2013-06-13 (×3): 1 [IU] via SUBCUTANEOUS
  Administered 2013-06-14 (×3): 2 [IU] via SUBCUTANEOUS
  Administered 2013-06-15: 18:00:00 3 [IU] via SUBCUTANEOUS
  Administered 2013-06-15: 1 [IU] via SUBCUTANEOUS
  Administered 2013-06-15 – 2013-06-16 (×3): 2 [IU] via SUBCUTANEOUS

## 2013-06-07 MED ORDER — ACETAMINOPHEN 650 MG RE SUPP: 650.0000 mg | Freq: Four times a day (QID) | RECTAL | Status: DC | PRN

## 2013-06-07 MED ORDER — NITROGLYCERIN 0.4 MG SL SUBL: 0.4000 mg | SUBLINGUAL_TABLET | SUBLINGUAL | Status: DC | PRN

## 2013-06-07 MED ORDER — ONDANSETRON HCL 4 MG/2ML IJ SOLN: 4.0000 mg | Freq: Three times a day (TID) | INTRAMUSCULAR | Status: DC | PRN

## 2013-06-07 MED ORDER — SODIUM CHLORIDE 0.9 % IJ SOLN
3.0000 mL | Freq: Two times a day (BID) | INTRAMUSCULAR | Status: DC
  Administered 2013-06-08 – 2013-06-16 (×16): 3 mL via INTRAVENOUS

## 2013-06-07 MED ORDER — ASPIRIN EC 81 MG PO TBEC
81.0000 mg | DELAYED_RELEASE_TABLET | Freq: Every day | ORAL | Status: DC
  Administered 2013-06-07 – 2013-06-16 (×10): 81 mg via ORAL
  Filled 2013-06-07 (×10): qty 1

## 2013-06-07 MED ORDER — CLOPIDOGREL BISULFATE 75 MG PO TABS
75.0000 mg | ORAL_TABLET | Freq: Every day | ORAL | Status: DC
  Administered 2013-06-08 – 2013-06-09 (×2): 75 mg via ORAL
  Filled 2013-06-07 (×2): qty 1

## 2013-06-07 MED ORDER — POTASSIUM CHLORIDE CRYS ER 20 MEQ PO TBCR
40.0000 meq | EXTENDED_RELEASE_TABLET | Freq: Two times a day (BID) | ORAL | Status: DC
  Administered 2013-06-07 – 2013-06-09 (×4): 40 meq via ORAL
  Filled 2013-06-07 (×5): qty 2

## 2013-06-07 MED ORDER — DM-GUAIFENESIN ER 30-600 MG PO TB12
1.0000 | ORAL_TABLET | Freq: Two times a day (BID) | ORAL | Status: DC | PRN
  Administered 2013-06-10: 13:00:00 1 via ORAL
  Filled 2013-06-07: qty 1

## 2013-06-07 MED ORDER — INSULIN GLARGINE 100 UNIT/ML ~~LOC~~ SOLN
35.0000 [IU] | Freq: Every day | SUBCUTANEOUS | Status: DC
  Administered 2013-06-07 – 2013-06-15 (×9): 35 [IU] via SUBCUTANEOUS
  Filled 2013-06-07 (×12): qty 0.35

## 2013-06-07 MED ORDER — ONDANSETRON HCL 4 MG/2ML IJ SOLN: 4.0000 mg | Freq: Four times a day (QID) | INTRAMUSCULAR | Status: DC | PRN

## 2013-06-07 MED ORDER — DILTIAZEM HCL 60 MG PO TABS
120.0000 mg | ORAL_TABLET | Freq: Two times a day (BID) | ORAL | Status: DC
  Administered 2013-06-07 – 2013-06-16 (×17): 120 mg via ORAL
  Filled 2013-06-07 (×20): qty 2

## 2013-06-07 MED ORDER — ALBUTEROL SULFATE HFA 108 (90 BASE) MCG/ACT IN AERS
2.0000 | INHALATION_SPRAY | RESPIRATORY_TRACT | Status: DC | PRN
Start: 2013-06-07 — End: 2013-06-16
  Administered 2013-06-13 – 2013-06-14 (×3): 2 via RESPIRATORY_TRACT
  Filled 2013-06-07 (×2): qty 6.7

## 2013-06-07 NOTE — Progress Notes (Signed)
ANTIBIOTIC CONSULT NOTE - INITIAL  Pharmacy Consult for Levaquin Indication: possible PNA  Allergies  Allergen Reactions  . Cefuroxime Axetil   . Cephalexin   . Clarithromycin   . Doxycycline     REACTION: rash on legs and feet, edema  . Erythromycin   . Tetracycline     Patient Measurements: Height: 4' 11.84" (152 cm) Weight: 188 lb 15 oz (85.7 kg) IBW/kg (Calculated) : 45.14  Vital Signs: Temp: 99.9 F (37.7 C) (10/26 1223) Temp src: Oral (10/26 1223) BP: 138/60 mmHg (10/26 1223) Pulse Rate: 73 (10/26 1223) Intake/Output from previous day:   Intake/Output from this shift:    Medical History: Past Medical History  Diagnosis Date  . CAD (coronary artery disease)   . CHF (congestive heart failure)   . Paroxysmal atrial fibrillation   . Bradycardia   . Hypertension   . Renal artery stenosis   . COPD (chronic obstructive pulmonary disease)   . Obesity hypoventilation syndrome   . Pruritus   . Tracheobronchitis   . Acute rhinosinusitis   . Anemia   . Hypoxemia   . Peripheral edema   . Obesity, endogenous   . Diabetes mellitus   . Acid reflux disease   . Osteoarthritis, generalized   . Hypothyroidism   . Lung nodule     lingula  . OSA (obstructive sleep apnea)     NPSG 04/03/00--AHI 28.hr  . DVT (deep venous thrombosis) 2003    left leg  . Vaginal atrophy   . Hx of colonic polyps     Assessment: 80 yoF sent [facility] walk-in [facility] for r/o PNA given SOB, productive cough, fever. CXR with findings suggest of fluid overload with vascular congestion, no consolidation or infiltrate. MD order to start Levaquin for possible PNA.   10/26 >> Levaquin >>  Tmax: afeb WBCs: 11.2K Renal: Scr pending  10/26 blood x 2 >> ordered  Dose changes/drug level info:  10/26: D1 Levaquin 750 mg IV q24h for possible PNA. F/u Scr with BMET and see if dose still appropriate. F/u CXR/clinidal diagnosis of PNA and d/c abx if PNA is ruled out.    Plan:   Levaquin 750  mg IV q24h  Pharmacy will f/u with BMET results and adjust if needed per renal function  F/u CXR for clinical dx of PNA and consider early discontinuation of abx if PNA is ruled out  Geoffry Paradise, PharmD, BCPS Pager: [phone] 2:12 PM Pharmacy #: [id number]

## 2013-06-07 NOTE — ED Notes (Signed)
Pt from home, sent from [facility] with SOB, cough with greenish, yellow sputum, and fever. Pt denies N/V/D, but states she had chest wall pain when she breathes in. Pt is A&O and in NAD. Family at bedside

## 2013-06-07 NOTE — ED Provider Notes (Signed)
TIME SEEN: 12:30 PM  CHIEF COMPLAINT: Fever, cough, shortness of breath, chest pain  HPI: Patient is a 79 year old female with a history of CAD, CHF, hypertension, COPD chronically on 2 L of oxygen, diabetes, hypothyroidism, obstructive sleep apnea who presents the emergency department with several days of diffuse chest tightness, shortness of breath, fever (Tmax 102), cough with green and yellow sputum production. Patient reports that she feels that she has had to increase her oxygen at home. Denies a prior history of MI. No lower extremity swelling or pain. No recent hospitalization, fracture, trauma, surgery. No prior history of PE or DVT. Denies any nausea, vomiting or diarrhea. Patient did report that 2 days ago she felt so weak that she had a fall from standing and hit her head on the ground. No loss of consciousness. She is on Plavix.  PCP is Shirlee Latch with [facility]  ROS: See HPI Constitutional: fever  Eyes: no drainage  ENT: no runny nose   Cardiovascular:  chest pain  Resp: SOB  GI: no vomiting GU: no dysuria Integumentary: no rash  Allergy: no hives  Musculoskeletal: no leg swelling  Neurological: no slurred speech ROS otherwise negative  PAST MEDICAL HISTORY/PAST SURGICAL HISTORY:  Past Medical History  Diagnosis Date  . CAD (coronary artery disease)   . CHF (congestive heart failure)   . Paroxysmal atrial fibrillation   . Bradycardia   . Hypertension   . Renal artery stenosis   . COPD (chronic obstructive pulmonary disease)   . Obesity hypoventilation syndrome   . Pruritus   . Tracheobronchitis   . Acute rhinosinusitis   . Anemia   . Hypoxemia   . Peripheral edema   . Obesity, endogenous   . Diabetes mellitus   . Acid reflux disease   . Osteoarthritis, generalized   . Hypothyroidism   . Lung nodule     lingula  . OSA (obstructive sleep apnea)     NPSG 04/03/00--AHI 28.hr  . DVT (deep venous thrombosis) 2003    left leg  . Vaginal atrophy   . Hx of colonic  polyps     MEDICATIONS:  Prior to Admission medications   Medication Sig Start Date End Date Taking? Authorizing Provider  albuterol (VENTOLIN HFA) 108 (90 BASE) MCG/ACT inhaler Inhale 2 puffs into the lungs every 4 (four) hours as needed for wheezing or shortness of breath. 04/23/13 02/02/15 Yes Clinton D Young, MD  amLODipine (NORVASC) 5 MG tablet Take 5 mg by mouth daily.   Yes Historical Provider, MD  azelastine (ASTELIN) 137 MCG/SPRAY nasal spray Place 2 sprays into the nose daily. Use in each nostril as directed 04/23/13 08/04/14 Yes Clinton D Young, MD  cetirizine (ZYRTEC) 10 MG tablet Take 1 tablet (10 mg total) by mouth daily. 04/23/13 06/30/14 Yes Clinton D Young, MD  clopidogrel (PLAVIX) 75 MG tablet Take 75 mg by mouth daily.     Yes Historical Provider, MD  cyclobenzaprine (FLEXERIL) 10 MG tablet Once a day    Yes Historical Provider, MD  dextromethorphan-guaiFENesin (MUCINEX DM) 30-600 MG per 12 hr tablet Take 1 tablet by mouth as needed (cough). As needed   Yes Historical Provider, MD  diltiazem (CARDIZEM) 120 MG tablet 2 tablets daily    Yes Historical Provider, MD  furosemide (LASIX) 40 MG tablet Take 80 mg by mouth 2 (two) times daily.    Yes Historical Provider, MD  glimepiride (AMARYL) 4 MG tablet Once a day    Yes Historical Provider, MD  insulin glargine (  LANTUS) 100 UNIT/ML injection 13 units every morning and 35 units every evening   Yes Historical Provider, MD  isosorbide mononitrate (IMDUR) 30 MG 24 hr tablet Take 30 mg by mouth daily.     Yes Historical Provider, MD  levothyroxine (SYNTHROID, LEVOTHROID) 50 MCG tablet 1/2 tablet mon, wed, fri and 1 whole tablet the other days    Yes Historical Provider, MD  lisinopril (PRINIVIL,ZESTRIL) 10 MG tablet Take 10 mg by mouth daily.     Yes Historical Provider, MD  montelukast (SINGULAIR) 10 MG tablet Take 1 tablet (10 mg total) by mouth at bedtime. 04/23/13 06/30/14 Yes Waymon Budge, MD  Multiple Vitamin (MULTIVITAMIN PO) Once  a day    Yes Historical Provider, MD  omeprazole (PRILOSEC OTC) 20 MG tablet Take one 3-60 minutes before 1st and last meals of the day    Yes Historical Provider, MD  oxybutynin (DITROPAN XL) 10 MG 24 hr tablet Take 10 mg by mouth daily.     Yes Historical Provider, MD  OxyCODONE (OXYCONTIN) 10 mg T12A 12 hr tablet Take 10 mg by mouth every 12 (twelve) hours.   Yes Historical Provider, MD  Prenatal Vit-Fe Sulfate-FA (PRENATAL VITAMIN PO) Once a day    Yes Historical Provider, MD  sotalol (BETAPACE) 80 MG tablet Take 1 1/2 tablets by mouth 2 times a day    Yes Historical Provider, MD  budesonide-formoterol (SYMBICORT) 160-4.5 MCG/ACT inhaler Inhale 2 puffs into the lungs 2 (two) times daily. 07/13/11 04/23/13  Waymon Budge, MD  ibandronate (BONIVA) 150 MG tablet Take 150 mg by mouth every 30 (thirty) days. Take in the morning with a full glass of water, on an empty stomach, and do not take anything else by mouth or lie down for the next 30 min.     Historical Provider, MD  nitroGLYCERIN (NITROSTAT) 0.4 MG SL tablet As needed     Historical Provider, MD    ALLERGIES:  Allergies  Allergen Reactions  . Cefuroxime Axetil   . Cephalexin   . Clarithromycin   . Doxycycline     REACTION: rash on legs and feet, edema  . Erythromycin   . Tetracycline     SOCIAL HISTORY:  History  Substance Use Topics  . Smoking status: Former Smoker    Quit date: 08/13/1978  . Smokeless tobacco: Never Used  . Alcohol Use: No    FAMILY HISTORY: Family History  Problem Relation Age of Onset  . Pneumonia Mother   . Heart disease Mother   . Allergies Mother   . Emphysema Mother   . Arthritis Mother   . Heart attack Father   . Stroke Sister   . Diabetes Sister   . Heart disease Sister   . Diabetes Brother   . Diabetes      grandfather  . Clotting disorder Brother   . Arthritis Sister   . Breast cancer      aunt  . Bone cancer      aunt    EXAM: BP 138/60  Pulse 73  Temp(Src) 99.9 F (37.7  C) (Oral)  Resp 22  SpO2 96% CONSTITUTIONAL: Alert and oriented and responds appropriately to questions. Well-appearing; well-nourished HEAD: Normocephalic, atraumatic EYES: Conjunctivae clear, PERRL ENT: normal nose; no rhinorrhea; moist mucous membranes; pharynx without lesions noted, no dental injury, no septal hematoma NECK: Supple, no meningismus, no LAD; no midline spinal tenderness, step-off or deformity CARD: RRR; S1 and S2 appreciated; no murmurs, no clicks, no rubs, no  gallops RESP: Normal chest excursion without splinting or tachypnea; breath sounds are equal bilaterally, no wheezing or rhonchi, Rales at bilateral bases ABD/GI: Normal bowel sounds; non-distended; soft, non-tender, no rebound, no guarding BACK:  The back appears normal and is non-tender to palpation, there is no CVA tenderness; no midline spinal tenderness, step-off or deformity EXT: Normal ROM in all joints; non-tender to palpation; no edema; normal capillary refill; no cyanosis    SKIN: Normal color for age and race; warm NEURO: Moves all extremities equally, cranial nerves II through XII intact, no pronator drift, sensation to light touch intact diffusely PSYCH: The patient's mood and manner are appropriate. Grooming and personal hygiene are appropriate.  MEDICAL DECISION MAKING: Patient sent here from walking clinic at [facility] physicians to rule out pneumonia. She is hemodynamically stable but has a temperature of 99.9.  Will obtain labs including cardiac labs given her chest tightness and risk factors for CAD, BMP, chest x-ray, blood cultures. Patient may need admission.  ED PROGRESS: Head CT negative.  Patient has a slight leukocytosis with left shift. Her potassium is 2.8. Will replace. Creatinine is elevated at 1.48 which is close to baseline. Urine shows no sign of infection. Lactate and troponin normal. EKG shows no new ischemic changes. Chest x-ray shows volume overload. Will give IV Lasix. No obvious  infiltrate but given patient's fever of 102 at home, leukocytosis, productive cough, will treat with IV antibiotics. Given patient is still having chest pain, increased oxygen requirement, pulmonary edema, will admit.  Pt and family pleased with this plan.     EKG Interpretation     Ventricular Rate:  73 PR Interval:  163 QRS Duration: 99 QT Interval:  421 QTC Calculation: 464 R Axis:   64 Text Interpretation:  Sinus rhythm Ventricular premature complex Baseline wander in lead(s) I II III aVR aVL aVF V1 V2 V3 V5 V6            Results for orders placed during the hospital encounter of 06/07/13  CBC WITH DIFFERENTIAL      Result Value Range   WBC 11.2 (*) 4.0 - 10.5 K/uL   RBC 3.58 (*) 3.87 - 5.11 MIL/uL   Hemoglobin 10.4 (*) 12.0 - 15.0 g/dL   HCT 40.9 (*) 81.1 - 91.4 %   MCV 86.9  78.0 - 100.0 fL   MCH 29.1  26.0 - 34.0 pg   MCHC 33.4  30.0 - 36.0 g/dL   RDW 78.2 (*) 95.6 - 21.3 %   Platelets 242  150 - 400 K/uL   Neutrophils Relative % 86 (*) 43 - 77 %   Neutro Abs 9.6 (*) 1.7 - 7.7 K/uL   Lymphocytes Relative 9 (*) 12 - 46 %   Lymphs Abs 1.0  0.7 - 4.0 K/uL   Monocytes Relative 5  3 - 12 %   Monocytes Absolute 0.6  0.1 - 1.0 K/uL   Eosinophils Relative 0  0 - 5 %   Eosinophils Absolute 0.0  0.0 - 0.7 K/uL   Basophils Relative 0  0 - 1 %   Basophils Absolute 0.0  0.0 - 0.1 K/uL  BASIC METABOLIC PANEL      Result Value Range   Sodium 133 (*) 135 - 145 mEq/L   Potassium 2.8 (*) 3.5 - 5.1 mEq/L   Chloride 96  96 - 112 mEq/L   CO2 23  19 - 32 mEq/L   Glucose, Bld 175 (*) 70 - 99 mg/dL   BUN 33 (*)  6 - 23 mg/dL   Creatinine, Ser 1.61 (*) 0.50 - 1.10 mg/dL   Calcium 9.4  8.4 - 09.6 mg/dL   GFR calc non Af Amer 33 (*) >90 mL/min   GFR calc Af Amer 38 (*) >90 mL/min  URINALYSIS, ROUTINE W REFLEX MICROSCOPIC      Result Value Range   Color, Urine YELLOW  YELLOW   APPearance CLEAR  CLEAR   Specific Gravity, Urine 1.022  1.005 - 1.030   pH 5.5  5.0 - 8.0    Glucose, UA NEGATIVE  NEGATIVE mg/dL   Hgb urine dipstick SMALL (*) NEGATIVE   Bilirubin Urine NEGATIVE  NEGATIVE   Ketones, ur NEGATIVE  NEGATIVE mg/dL   Protein, ur >045 (*) NEGATIVE mg/dL   Urobilinogen, UA 1.0  0.0 - 1.0 mg/dL   Nitrite NEGATIVE  NEGATIVE   Leukocytes, UA SMALL (*) NEGATIVE  PRO B NATRIURETIC PEPTIDE      Result Value Range   Pro B Natriuretic peptide (BNP) 3554.0 (*) 0 - 450 pg/mL  URINE MICROSCOPIC-ADD ON      Result Value Range   Squamous Epithelial / LPF RARE  RARE   WBC, UA 3-6  <3 WBC/hpf  CG4 I-STAT (LACTIC ACID)      Result Value Range   Lactic Acid, Venous 1.02  0.5 - 2.2 mmol/L  POCT I-STAT TROPONIN I      Result Value Range   Troponin i, poc 0.04  0.00 - 0.08 ng/mL   Comment 3            Dg Chest 2 View  06/07/2013   CLINICAL DATA:  Shortness of breath and chest pain  EXAM: CHEST  2 VIEW  COMPARISON:  01/2013  FINDINGS: Mild cardiac enlargement. Mild to moderate vascular congestion with no pulmonary edema. No consolidation or infiltrate. Minimal pleural effusions.  IMPRESSION: Findings suggest an element of fluid overload with vascular congestion but no current pulmonary edema.   Electronically Signed   By: Esperanza Heir M.D.   On: 06/07/2013 13:41   Ct Head Wo Contrast  06/07/2013   CLINICAL DATA:  Fall days ago, takes blood thinners  EXAM: CT HEAD WITHOUT CONTRAST  TECHNIQUE: Contiguous axial images were obtained from the base of the skull through the vertex without intravenous contrast.  COMPARISON:  12/31/2009  FINDINGS: Diffuse age-related atrophy. Diffuse bilateral low attenuation in the deep white matter. No evidence of hemorrhage or extra-axial fluid. No vascular territory infarct. Bilateral basal ganglia calcification. Calvarium is intact.  IMPRESSION: No acute abnormalities   Electronically Signed   By: Esperanza Heir M.D.   On: 06/07/2013 14:11      Layla Maw Ayauna Mcnay, DO 06/07/13 1527

## 2013-06-07 NOTE — H&P (Addendum)
[facility] History and Physical  [name] WJX:[id number] DOB: 1934/06/14 DOA: 06/07/2013  Referring physician:  PCP: Gweneth Dimitri, MD  Specialists:   Chief Complaint: SOB, productive cough   HPI: [name] is a 79 y.o. female with PMH of COPD/chronic respiratory failure on home o2, CHF, PAF, CAD s/p PTCA/stent, IDDM, HTN, HPL, OSA on CPAP presented with SOB, productive cough, green/yellow sputum for 4-5 days, associated with fever, chills; denies exertional symptoms, no chest pain, no nausea, vomiting or diarrhea; no focal neuro symptoms;  -ED: she received IV lasix felt to be fluid overloaded   Review of Systems: The patient denies anorexia, fever, weight loss,, vision loss, decreased hearing, hoarseness, chest pain, syncope,  peripheral edema, balance deficits, hemoptysis, abdominal pain, melena, hematochezia, severe indigestion/heartburn, hematuria, incontinence, genital sores, muscle weakness, suspicious skin lesions, transient blindness, difficulty walking, depression, unusual weight change, abnormal bleeding, enlarged lymph nodes, angioedema, and breast masses.    Past Medical History  Diagnosis Date  . CAD (coronary artery disease)   . CHF (congestive heart failure)   . Paroxysmal atrial fibrillation   . Bradycardia   . Hypertension   . Renal artery stenosis   . COPD (chronic obstructive pulmonary disease)   . Obesity hypoventilation syndrome   . Pruritus   . Tracheobronchitis   . Acute rhinosinusitis   . Anemia   . Hypoxemia   . Peripheral edema   . Obesity, endogenous   . Diabetes mellitus   . Acid reflux disease   . Osteoarthritis, generalized   . Hypothyroidism   . Lung nodule     lingula  . OSA (obstructive sleep apnea)     NPSG 04/03/00--AHI 28.hr  . DVT (deep venous thrombosis) 2003    left leg  . Vaginal atrophy   . Hx of colonic polyps    Past Surgical History  Procedure Laterality Date  . Lumbar spine surgery    .  Cholecystectomy    . Hemorroidectomy    . Repair wound fistula    . Right shoulder    . Elbow surgery    . Dilation and curettage of uterus    . Breast surgery  1998    lumpectomy  . Oophorectomy  1959    BSO-? ovarian cancer   Social History:  reports that she quit smoking about 34 years ago. She has never used smokeless tobacco. She reports that she does not drink alcohol or use illicit drugs. Home: where does patient live--home, ALF, SNF? and with whom if at home? Yes: Can patient participate in ADLs?  Allergies  Allergen Reactions  . Cefuroxime Axetil   . Cephalexin   . Clarithromycin   . Doxycycline     REACTION: rash on legs and feet, edema  . Erythromycin   . Tetracycline     Family History  Problem Relation Age of Onset  . Pneumonia Mother   . Heart disease Mother   . Allergies Mother   . Emphysema Mother   . Arthritis Mother   . Heart attack Father   . Stroke Sister   . Diabetes Sister   . Heart disease Sister   . Diabetes Brother   . Diabetes      grandfather  . Clotting disorder Brother   . Arthritis Sister   . Breast cancer      aunt  . Bone cancer      aunt    (be sure to complete)  Prior to Admission medications   Medication Sig  Start Date End Date Taking? Authorizing Provider  albuterol (VENTOLIN HFA) 108 (90 BASE) MCG/ACT inhaler Inhale 2 puffs into the lungs every 4 (four) hours as needed for wheezing or shortness of breath. 04/23/13 02/02/15 Yes Clinton D Young, MD  amLODipine (NORVASC) 5 MG tablet Take 5 mg by mouth daily.   Yes Historical Provider, MD  azelastine (ASTELIN) 137 MCG/SPRAY nasal spray Place 2 sprays into the nose daily. Use in each nostril as directed 04/23/13 08/04/14 Yes Clinton D Young, MD  cetirizine (ZYRTEC) 10 MG tablet Take 1 tablet (10 mg total) by mouth daily. 04/23/13 06/30/14 Yes Clinton D Young, MD  clopidogrel (PLAVIX) 75 MG tablet Take 75 mg by mouth daily.     Yes Historical Provider, MD  cyclobenzaprine (FLEXERIL) 10  MG tablet Once a day    Yes Historical Provider, MD  dextromethorphan-guaiFENesin (MUCINEX DM) 30-600 MG per 12 hr tablet Take 1 tablet by mouth as needed (cough). As needed   Yes Historical Provider, MD  diltiazem (CARDIZEM) 120 MG tablet 2 tablets daily    Yes Historical Provider, MD  furosemide (LASIX) 40 MG tablet Take 80 mg by mouth 2 (two) times daily.    Yes Historical Provider, MD  glimepiride (AMARYL) 4 MG tablet Once a day    Yes Historical Provider, MD  insulin glargine (LANTUS) 100 UNIT/ML injection 13 units every morning and 35 units every evening   Yes Historical Provider, MD  isosorbide mononitrate (IMDUR) 30 MG 24 hr tablet Take 30 mg by mouth daily.     Yes Historical Provider, MD  levothyroxine (SYNTHROID, LEVOTHROID) 50 MCG tablet 1/2 tablet mon, wed, fri and 1 whole tablet the other days    Yes Historical Provider, MD  lisinopril (PRINIVIL,ZESTRIL) 10 MG tablet Take 10 mg by mouth daily.     Yes Historical Provider, MD  montelukast (SINGULAIR) 10 MG tablet Take 1 tablet (10 mg total) by mouth at bedtime. 04/23/13 06/30/14 Yes Waymon Budge, MD  Multiple Vitamin (MULTIVITAMIN PO) Once a day    Yes Historical Provider, MD  omeprazole (PRILOSEC OTC) 20 MG tablet Take one 3-60 minutes before 1st and last meals of the day    Yes Historical Provider, MD  oxybutynin (DITROPAN XL) 10 MG 24 hr tablet Take 10 mg by mouth daily.     Yes Historical Provider, MD  OxyCODONE (OXYCONTIN) 10 mg T12A 12 hr tablet Take 10 mg by mouth every 12 (twelve) hours.   Yes Historical Provider, MD  Prenatal Vit-Fe Sulfate-FA (PRENATAL VITAMIN PO) Once a day    Yes Historical Provider, MD  sotalol (BETAPACE) 80 MG tablet Take 1 1/2 tablets by mouth 2 times a day    Yes Historical Provider, MD  budesonide-formoterol (SYMBICORT) 160-4.5 MCG/ACT inhaler Inhale 2 puffs into the lungs 2 (two) times daily. 07/13/11 04/23/13  Waymon Budge, MD  ibandronate (BONIVA) 150 MG tablet Take 150 mg by mouth every 30  (thirty) days. Take in the morning with a full glass of water, on an empty stomach, and do not take anything else by mouth or lie down for the next 30 min.     Historical Provider, MD  nitroGLYCERIN (NITROSTAT) 0.4 MG SL tablet As needed     Historical Provider, MD   Physical Exam: Filed Vitals:   06/07/13 1430  BP: 148/52  Pulse: 118  Temp:   Resp: 24     General:  alert  Eyes: Eomi-I  ENT: no oral ulcers   Neck: mild  JVD  Cardiovascular: s1,s2 irregular  Respiratory: LL crackles, rales   Abdomen: soft, nd, nt  Skin: no rash   Musculoskeletal: no LE edema   Psychiatric: no hallucinations   Neurologic: CN 2-12 intact, motor 5/5 intact   Labs on Admission:  Basic Metabolic Panel:  Recent Labs Lab 06/07/13 1300  NA 133*  K 2.8*  CL 96  CO2 23  GLUCOSE 175*  BUN 33*  CREATININE 1.48*  CALCIUM 9.4   Liver Function Tests: No results found for this basename: AST, ALT, ALKPHOS, BILITOT, PROT, ALBUMIN,  in the last 168 hours No results found for this basename: LIPASE, AMYLASE,  in the last 168 hours No results found for this basename: AMMONIA,  in the last 168 hours CBC:  Recent Labs Lab 06/07/13 1300  WBC 11.2*  NEUTROABS 9.6*  HGB 10.4*  HCT 31.1*  MCV 86.9  PLT 242   Cardiac Enzymes: No results found for this basename: CKTOTAL, CKMB, CKMBINDEX, TROPONINI,  in the last 168 hours  BNP (last 3 results)  Recent Labs  06/07/13 1300  PROBNP 3554.0*   CBG: No results found for this basename: GLUCAP,  in the last 168 hours  Radiological Exams on Admission: Dg Chest 2 View  06/07/2013   CLINICAL DATA:  Shortness of breath and chest pain  EXAM: CHEST  2 VIEW  COMPARISON:  01/2013  FINDINGS: Mild cardiac enlargement. Mild to moderate vascular congestion with no pulmonary edema. No consolidation or infiltrate. Minimal pleural effusions.  IMPRESSION: Findings suggest an element of fluid overload with vascular congestion but no current pulmonary edema.    Electronically Signed   By: Esperanza Heir M.D.   On: 06/07/2013 13:41   Ct Head Wo Contrast  06/07/2013   CLINICAL DATA:  Fall days ago, takes blood thinners  EXAM: CT HEAD WITHOUT CONTRAST  TECHNIQUE: Contiguous axial images were obtained from the base of the skull through the vertex without intravenous contrast.  COMPARISON:  12/31/2009  FINDINGS: Diffuse age-related atrophy. Diffuse bilateral low attenuation in the deep white matter. No evidence of hemorrhage or extra-axial fluid. No vascular territory infarct. Bilateral basal ganglia calcification. Calvarium is intact.  IMPRESSION: No acute abnormalities   Electronically Signed   By: Esperanza Heir M.D.   On: 06/07/2013 14:11    EKG: Independently reviewed. NSR, PVC, no acute ST/T changes   Assessment/Plan Principal Problem:   COPD with acute exacerbation Active Problems:   CHF (congestive heart failure), NYHA class II   DM   OBSTRUCTIVE SLEEP APNEA   Atrial fibrillation    79 y.o. female with PMH of COPD/chronic respiratory failure on home o2, CHF, PAF, CAD s/p PTCA/stent, IDDM, HTN, HPL, OSA on CPAP presented with SOB, productive cough, green/yellow sputum for 4-5 days likely COPD/CHF/OSA   1. COPD exacerbation/chronic respiratory failure; CXR: no clear infiltrate, but some patchy opacities; will obtain CT chest for better eval  -start IV atx, bronchodilators, oxygen, steroids inhaled   2. CHF exacerbation; clinically mild fluid overload/not significant; CXR: congestion; no recent echo -given 40 mg lasix in ED; replace lytes; cont lasix 60 mg BID; daily weight, I/O; monitor urine output, renal function;  cont NTG, obtain echo  3. OSA on CPAP; cont CPAP  4. IDDM; no recent HA1C; cont insulin regimen; obtain HA1c  5. Hypothyroidism; cont levothyroxine, obtain TSH  6. CAD s/p PTCA; on plavix; last stent (2009 per patient); not on BB likely due to severe COPD -may need to switch plavix to ASA; start  AC for A fib; c/s  cardiology opinion   7. A fib not on AC; tele shows fib; obtain another ECG; as above; cont sotalol; AC defer to cardiology   8. Hypo Na, Hypo K likely due to diuretics; replace monitor on diuresis;   9. CKD III, monitor on diuretics;   Cardiology;  if consultant consulted, please document name and whether formally or informally consulted  Code Status: full (must indicate code status--if unknown or must be presumed, indicate so) Family Communication: husband, family at the bedside (indicate person spoken with, if applicable, with phone number if by telephone) Disposition Plan: home when ready  (indicate anticipated LOS)  Time spent: >50 minutes   Esperanza Sheets [facility] Pager [phone]  If 7PM-7AM, please contact night-coverage [email or web address] Password [facility] 06/07/2013, 3:29 PM

## 2013-06-07 NOTE — Consult Note (Signed)
Admit date: 06/07/2013 Referring Physician  Dr. York Spaniel Primary Physician  Dr. Corliss Blacker Primary Cardiologist  Dr. Eldridge Dace Reason for Consultation  AFib  HPI:  79 y.o. female with PMH of COPD/chronic respiratory failure on home o2, CHF, PAF, CAD s/p  Of LAD, IDDM, HTN, HPL, OSA on CPAP presented with SOB, productive cough, green/yellow sputum for 4-5 days, associated with fever, chills.  No exertional symptoms, no chest pain, no nausea, vomiting or diarrhea; no focal neuro symptoms;  -ED: she received IV lasix felt to be fluid overloaded .  She has urinated several times but breathing is still somewhat limited.      PMH:   Past Medical History  Diagnosis Date  . CAD (coronary artery disease)   . CHF (congestive heart failure)   . Paroxysmal atrial fibrillation   . Bradycardia   . Hypertension   . Renal artery stenosis   . COPD (chronic obstructive pulmonary disease)   . Obesity hypoventilation syndrome   . Pruritus   . Tracheobronchitis   . Acute rhinosinusitis   . Anemia   . Hypoxemia   . Peripheral edema   . Obesity, endogenous   . Diabetes mellitus   . Acid reflux disease   . Osteoarthritis, generalized   . Hypothyroidism   . Lung nodule     lingula  . OSA (obstructive sleep apnea)     NPSG 04/03/00--AHI 28.hr  . DVT (deep venous thrombosis) 2003    left leg  . Vaginal atrophy   . Hx of colonic polyps      PSH:   Past Surgical History  Procedure Laterality Date  . Lumbar spine surgery    . Cholecystectomy    . Hemorroidectomy    . Repair wound fistula    . Right shoulder    . Elbow surgery    . Dilation and curettage of uterus    . Breast surgery  1998    lumpectomy  . Oophorectomy  1959    BSO-? ovarian cancer    Allergies:  Cefuroxime axetil; Cephalexin; Clarithromycin; Doxycycline; Erythromycin; and Tetracycline Prior to Admit Meds:   Prescriptions prior to admission  Medication Sig Dispense Refill  . albuterol (VENTOLIN HFA) 108 (90 BASE) MCG/ACT  inhaler Inhale 2 puffs into the lungs every 4 (four) hours as needed for wheezing or shortness of breath.  3 Inhaler  3  . amLODipine (NORVASC) 5 MG tablet Take 5 mg by mouth daily.      Marland Kitchen azelastine (ASTELIN) 137 MCG/SPRAY nasal spray Place 2 sprays into the nose daily. Use in each nostril as directed  90 mL  3  . cetirizine (ZYRTEC) 10 MG tablet Take 1 tablet (10 mg total) by mouth daily.  90 tablet  3  . clopidogrel (PLAVIX) 75 MG tablet Take 75 mg by mouth daily.        . cyclobenzaprine (FLEXERIL) 10 MG tablet Once a day       . dextromethorphan-guaiFENesin (MUCINEX DM) 30-600 MG per 12 hr tablet Take 1 tablet by mouth as needed (cough). As needed      . diltiazem (CARDIZEM) 120 MG tablet 2 tablets daily       . furosemide (LASIX) 40 MG tablet Take 80 mg by mouth 2 (two) times daily.       Marland Kitchen glimepiride (AMARYL) 4 MG tablet Once a day       . insulin glargine (LANTUS) 100 UNIT/ML injection 13 units every morning and 35 units every evening      .  isosorbide mononitrate (IMDUR) 30 MG 24 hr tablet Take 30 mg by mouth daily.        Marland Kitchen levothyroxine (SYNTHROID, LEVOTHROID) 50 MCG tablet 1/2 tablet mon, wed, fri and 1 whole tablet the other days       . lisinopril (PRINIVIL,ZESTRIL) 10 MG tablet Take 10 mg by mouth daily.        . montelukast (SINGULAIR) 10 MG tablet Take 1 tablet (10 mg total) by mouth at bedtime.  90 tablet  3  . Multiple Vitamin (MULTIVITAMIN PO) Once a day       . omeprazole (PRILOSEC OTC) 20 MG tablet Take one 3-60 minutes before 1st and last meals of the day       . oxybutynin (DITROPAN XL) 10 MG 24 hr tablet Take 10 mg by mouth daily.        . OxyCODONE (OXYCONTIN) 10 mg T12A 12 hr tablet Take 10 mg by mouth every 12 (twelve) hours.      . Prenatal Vit-Fe Sulfate-FA (PRENATAL VITAMIN PO) Once a day       . sotalol (BETAPACE) 80 MG tablet Take 1 1/2 tablets by mouth 2 times a day       . budesonide-formoterol (SYMBICORT) 160-4.5 MCG/ACT inhaler Inhale 2 puffs into the lungs  2 (two) times daily.  3 Inhaler  3  . ibandronate (BONIVA) 150 MG tablet Take 150 mg by mouth every 30 (thirty) days. Take in the morning with a full glass of water, on an empty stomach, and do not take anything else by mouth or lie down for the next 30 min.       . nitroGLYCERIN (NITROSTAT) 0.4 MG SL tablet As needed        Fam HX:    Family History  Problem Relation Age of Onset  . Pneumonia Mother   . Heart disease Mother   . Allergies Mother   . Emphysema Mother   . Arthritis Mother   . Heart attack Father   . Stroke Sister   . Diabetes Sister   . Heart disease Sister   . Diabetes Brother   . Diabetes      grandfather  . Clotting disorder Brother   . Arthritis Sister   . Breast cancer      aunt  . Bone cancer      aunt   Social HX:    History   Social History  . Marital Status: Married    Spouse Name: N/A    Number of Children: 2  . Years of Education: N/A   Occupational History  . Not on file.   Social History Main Topics  . Smoking status: Former Smoker    Quit date: 08/13/1978  . Smokeless tobacco: Never Used  . Alcohol Use: No  . Drug Use: No  . Sexual Activity: No   Other Topics Concern  . Not on file   Social History Narrative  . No narrative on file     ROS:  All 11 ROS were addressed and are negative except what is stated in the HPI  Physical Exam: Blood pressure 129/80, pulse 95, temperature 99 F (37.2 C), temperature source Oral, resp. rate 24, height 4' 11.84" (1.52 m), weight 184 lb 11.9 oz (83.8 kg), SpO2 98.00%.    General: Well developed, well nourished, in no acute distress Head:    Normal cephalic and atramatic  Lungs:   Clear bilaterally to auscultation and percussion. Heart:   HRRR S1 S2 .  No JVD.  Abdomen: Obese Msk:  Back normal, normal gait. Normal strength and tone for age. Extremities:  Tr LE edema.  DP +1 Neuro: Alert and oriented X 3. Psych:  Good affect, responds appropriately    Labs:   Lab Results  Component  Value Date   WBC 11.2* 06/07/2013   HGB 10.4* 06/07/2013   HCT 31.1* 06/07/2013   MCV 86.9 06/07/2013   PLT 242 06/07/2013    Recent Labs Lab 06/07/13 1300  NA 133*  K 2.8*  CL 96  CO2 23  BUN 33*  CREATININE 1.48*  CALCIUM 9.4  GLUCOSE 175*   No results found for this basename: PTT   Lab Results  Component Value Date   INR 0.98 09/06/2010   INR 1.0 08/27/2007   Lab Results  Component Value Date   CKTOTAL 21 09/08/2007   CKMB 1.2 09/08/2007   TROPONINI <0.30 06/07/2013     No results found for this basename: CHOL   No results found for this basename: HDL   No results found for this basename: LDLCALC   No results found for this basename: TRIG   No results found for this basename: CHOLHDL   No results found for this basename: LDLDIRECT      Radiology:  Dg Chest 2 View  06/07/2013   CLINICAL DATA:  Shortness of breath and chest pain  EXAM: CHEST  2 VIEW  COMPARISON:  01/2013  FINDINGS: Mild cardiac enlargement. Mild to moderate vascular congestion with no pulmonary edema. No consolidation or infiltrate. Minimal pleural effusions.  IMPRESSION: Findings suggest an element of fluid overload with vascular congestion but no current pulmonary edema.   Electronically Signed   By: Esperanza Heir M.D.   On: 06/07/2013 13:41   Ct Head Wo Contrast  06/07/2013   CLINICAL DATA:  Fall days ago, takes blood thinners  EXAM: CT HEAD WITHOUT CONTRAST  TECHNIQUE: Contiguous axial images were obtained from the base of the skull through the vertex without intravenous contrast.  COMPARISON:  12/31/2009  FINDINGS: Diffuse age-related atrophy. Diffuse bilateral low attenuation in the deep white matter. No evidence of hemorrhage or extra-axial fluid. No vascular territory infarct. Bilateral basal ganglia calcification. Calvarium is intact.  IMPRESSION: No acute abnormalities   Electronically Signed   By: Esperanza Heir M.D.   On: 06/07/2013 14:11   Ct Chest Wo Contrast  06/07/2013    CLINICAL DATA:  Shortness of breath. Cough and fever. History of tracheal bronchitis and COPD. History of lung nodule.  EXAM: CT CHEST WITHOUT CONTRAST  TECHNIQUE: Multidetector CT imaging of the chest was performed following the standard protocol without IV contrast.  COMPARISON:  Multiple exams, including 06/07/2013 and 06/02/2008  FINDINGS: Right paratracheal node short axis 1.3 cm, formerly 0.7 cm. A low right paratracheal lymph node short axis 1.4 cm, formerly 0.8 cm. Probable hilar adenopathy. Other nodes have also increased in size significantly.  Dense atherosclerosis.  This includes the coronary arteries.  Consolidation in the right lower lobe with surrounding faint nodularity and marginal ground-glass opacity. Chronic lingular scarring. Faint ground-glass densities and volume loss in the left lower lobe.  Numerous chronic thoracic compression fractures are observed. Suspected emphysema. Old healed bilateral rib fractures anteriorly.  IMPRESSION: 1. Consolidation in the right lower lobe with new pathologic mediastinal adenopathy. Appearance favors bacterial pneumonia. Followup chest radiography is recommended in 4-6 weeks time to ensure resolution and exclude underlying malignancy. Alternatively CT would provide highly specific followup and allow direct assessment of  the adenopathy for resolution. Patchy ground-glass opacities in the right lower lobe and to a lesser extent in the left lower lobe are also likely inflammatory. 2. Mild atelectasis in the left lower lobe. Stable lingular scarring. 3. Atherosclerosis.   Electronically Signed   By: Herbie Baltimore M.D.   On: 06/07/2013 18:42    EKG:  *NSR, no ST changes  ASSESSMENT:  PAF  PLAN:  SHOB: IV lasix.  Likely acute diastolic heart failure.  AFib: sotalol.  Will start anticoagulation with IV heparin and warfarin if AFib persistes.  Stop plavix.  It has been many years since her DES to LAD, 2008.  CAD: No angina.  Stent was patent at the time  of the last cath a few years ago.    Corky Crafts., MD  06/07/2013  9:32 PM

## 2013-06-07 NOTE — Progress Notes (Signed)
Pt. Arrived from ED. Alert and oriented x 3. Family at bedside, no respiratory  distress noted. O2 2.5L/[place]. Continue with admission assessment.

## 2013-06-07 NOTE — ED Notes (Signed)
Pt sent here from [facility] walk in clinic for ruling out PNA. Shob, cough fever times 2 days

## 2013-06-08 DIAGNOSIS — I369 Nonrheumatic tricuspid valve disorder, unspecified: Secondary | ICD-10-CM

## 2013-06-08 DIAGNOSIS — I5031 Acute diastolic (congestive) heart failure: Secondary | ICD-10-CM

## 2013-06-08 DIAGNOSIS — Z7901 Long term (current) use of anticoagulants: Secondary | ICD-10-CM

## 2013-06-08 DIAGNOSIS — J189 Pneumonia, unspecified organism: Secondary | ICD-10-CM | POA: Diagnosis present

## 2013-06-08 LAB — BASIC METABOLIC PANEL
BUN: 29 mg/dL — ABNORMAL HIGH (ref 6–23)
Calcium: 9.1 mg/dL (ref 8.4–10.5)
GFR calc Af Amer: 39 mL/min — ABNORMAL LOW (ref >90)
GFR calc non Af Amer: 33 mL/min — ABNORMAL LOW (ref >90)
Potassium: 3.9 mEq/L (ref 3.5–5.1)
Sodium: 137 mEq/L (ref 135–145)

## 2013-06-08 LAB — CBC
Hemoglobin: 9 g/dL — ABNORMAL LOW (ref 12.0–15.0)
MCH: 28.6 pg (ref 26.0–34.0)
MCHC: 32.7 g/dL (ref 30.0–36.0)
Platelets: 207 10*3/uL (ref 150–400)
RBC: 3.15 MIL/uL — ABNORMAL LOW (ref 3.87–5.11)

## 2013-06-08 LAB — GLUCOSE, CAPILLARY
Glucose-Capillary: 144 mg/dL — ABNORMAL HIGH (ref 70–99)
Glucose-Capillary: 152 mg/dL — ABNORMAL HIGH (ref 70–99)
Glucose-Capillary: 169 mg/dL — ABNORMAL HIGH (ref 70–99)
Glucose-Capillary: 146 mg/dL — ABNORMAL HIGH (ref 70–99)

## 2013-06-08 LAB — HEMOGLOBIN A1C
Hgb A1c MFr Bld: 7.7 % — ABNORMAL HIGH (ref <5.7)
Mean Plasma Glucose: 174 mg/dL — ABNORMAL HIGH (ref <117)

## 2013-06-08 LAB — TSH: TSH: 0.491 u[IU]/mL (ref 0.350–4.500)

## 2013-06-08 LAB — PROTIME-INR
INR: 1.39 (ref 0.00–1.49)
Prothrombin Time: 16.7 seconds — ABNORMAL HIGH (ref 11.6–15.2)

## 2013-06-08 MED ORDER — FUROSEMIDE 10 MG/ML IJ SOLN
80.0000 mg | Freq: Two times a day (BID) | INTRAMUSCULAR | Status: DC
  Administered 2013-06-08 – 2013-06-09 (×3): 80 mg via INTRAVENOUS
  Filled 2013-06-08 (×4): qty 8

## 2013-06-08 MED ORDER — GLUCERNA SHAKE PO LIQD
237.0000 mL | Freq: Two times a day (BID) | ORAL | Status: DC
  Administered 2013-06-08 – 2013-06-13 (×8): 237 mL via ORAL
  Filled 2013-06-08 (×17): qty 237

## 2013-06-08 MED ORDER — WARFARIN VIDEO
Freq: Once | Status: AC
  Administered 2013-06-08: 14:00:00

## 2013-06-08 MED ORDER — DOCUSATE SODIUM 100 MG PO CAPS
100.0000 mg | ORAL_CAPSULE | Freq: Two times a day (BID) | ORAL | Status: DC
  Administered 2013-06-08 – 2013-06-16 (×16): 100 mg via ORAL
  Filled 2013-06-08 (×18): qty 1

## 2013-06-08 MED ORDER — POLYETHYLENE GLYCOL 3350 17 G PO PACK
17.0000 g | PACK | Freq: Every day | ORAL | Status: DC
  Administered 2013-06-08 – 2013-06-16 (×9): 17 g via ORAL
  Filled 2013-06-08 (×9): qty 1

## 2013-06-08 MED ORDER — HYDROCODONE-ACETAMINOPHEN 5-325 MG PO TABS
1.0000 | ORAL_TABLET | Freq: Four times a day (QID) | ORAL | Status: DC | PRN
  Administered 2013-06-08 – 2013-06-12 (×10): 2 via ORAL
  Administered 2013-06-12: 1 via ORAL
  Administered 2013-06-13 – 2013-06-14 (×4): 2 via ORAL
  Filled 2013-06-08: qty 1
  Filled 2013-06-08 (×4): qty 2
  Filled 2013-06-08: qty 1
  Filled 2013-06-08 (×11): qty 2

## 2013-06-08 MED ORDER — WARFARIN - PHARMACIST DOSING INPATIENT: Freq: Every day | Status: DC

## 2013-06-08 MED ORDER — WARFARIN SODIUM 5 MG PO TABS
5.0000 mg | ORAL_TABLET | Freq: Once | ORAL | Status: AC
  Administered 2013-06-08: 18:00:00 5 mg via ORAL
  Filled 2013-06-08: qty 1

## 2013-06-08 MED ORDER — PATIENT'S GUIDE TO USING COUMADIN BOOK
Freq: Once | Status: AC
  Administered 2013-06-08: 14:00:00
  Filled 2013-06-08: qty 1

## 2013-06-08 NOTE — Progress Notes (Signed)
[facility] PROGRESS NOTE  [name] ZOX:[id number] DOB: 06-27-1934 DOA: 06/07/2013 PCP: Gweneth Dimitri, MD  Assessment/Plan: SOB -Likely 2/2 CAP and acute diastolic CHF. -See below for details.  CAP -Continue levaquin for now. -Obtain cx data.  Acute Diastolic CHF -Last ECHO from 2009 with EF of 65%. -Will order. -Continue lasix as ordered by cards: 60 mg IV BID. -Strive for negative fluid balance. -Is 600 cc negative since admission.  Atrial Fibrillation -DC Plavix and start on warfarin. -No need for bridging.  IDDM -Fair control. -Continue current regimen.  Hypothyroidism -TSH ok. -Continue synthroid.  CKD Stage III -Cr is stable. -Follow with diuresis.  Code Status: Full Code Family Communication: Patient only  Disposition Plan: Anticipate home when medically stable.   Consultants:  Cardiology   Antibiotics:  Levaquin 06/07/2013-->   Subjective: C/o SOB and CP with deep inspiration.  Objective: Filed Vitals:   06/07/13 1638 06/07/13 2053 06/08/13 0500 06/08/13 1040  BP: 155/55 129/80 145/56   Pulse: 97 95 58   Temp: 100.1 F (37.8 C) 99 F (37.2 C) 97.8 F (36.6 C)   TempSrc: Oral Oral Axillary   Resp: 21 24 22    Height:      Weight: 83.8 kg (184 lb 11.9 oz)  83.3 kg (183 lb 10.3 oz)   SpO2: 98% 98% 100% 97%    Intake/Output Summary (Last 24 hours) at 06/08/13 1042 Last data filed at 06/08/13 1009  Gross per 24 hour  Intake   1640 ml  Output   2250 ml  Net   -610 ml   Filed Weights   06/07/13 1300 06/07/13 1638 06/08/13 0500  Weight: 85.7 kg (188 lb 15 oz) 83.8 kg (184 lb 11.9 oz) 83.3 kg (183 lb 10.3 oz)    Exam:   General:  AA Ox3  Cardiovascular: irregular  Respiratory: No wheezing, mild bibasilar crackles  Abdomen: obese/S/NT/ND/+BS  Extremities: 1+edema bilaterally   Neurologic:  Grossly intact and non-focal.  Data Reviewed: Basic Metabolic Panel:  Recent Labs Lab 06/07/13 1300  06/08/13 0535  NA 133* 137  K 2.8* 3.9  CL 96 101  CO2 23 25  GLUCOSE 175* 146*  BUN 33* 29*  CREATININE 1.48* 1.47*  CALCIUM 9.4 9.1   Liver Function Tests: No results found for this basename: AST, ALT, ALKPHOS, BILITOT, PROT, ALBUMIN,  in the last 168 hours No results found for this basename: LIPASE, AMYLASE,  in the last 168 hours No results found for this basename: AMMONIA,  in the last 168 hours CBC:  Recent Labs Lab 06/07/13 1300 06/08/13 0535  WBC 11.2* 9.8  NEUTROABS 9.6*  --   HGB 10.4* 9.0*  HCT 31.1* 27.5*  MCV 86.9 87.3  PLT 242 207   Cardiac Enzymes:  Recent Labs Lab 06/07/13 1830 06/07/13 2220 06/08/13 0535  TROPONINI <0.30 <0.30 <0.30   BNP (last 3 results)  Recent Labs  06/07/13 1300  PROBNP 3554.0*   CBG:  Recent Labs Lab 06/07/13 1835 06/07/13 2056 06/08/13 0737  GLUCAP 140* 159* 144*    Recent Results (from the past 240 hour(s))  CULTURE, BLOOD (ROUTINE X 2)     Status: None   Collection Time    06/07/13  1:00 PM      Result Value Range Status   Specimen Description BLOOD RIGHT ARM  5 ML IN [facility] BOTTLE   Final   Special Requests NONE   Final   Culture  Setup Time     Final   Value:  06/07/2013 17:46     Performed at [facility]   Culture     Final   Value:        BLOOD CULTURE RECEIVED NO GROWTH TO DATE CULTURE WILL BE HELD FOR 5 DAYS BEFORE ISSUING A FINAL NEGATIVE REPORT     Performed at [facility]   Report Status PENDING   Incomplete  CULTURE, BLOOD (ROUTINE X 2)     Status: None   Collection Time    06/07/13  1:45 PM      Result Value Range Status   Specimen Description BLOOD LEFT ARM  8 ML IN [facility] BOTTLE   Final   Special Requests NONE   Final   Culture  Setup Time     Final   Value: 06/07/2013 17:45     Performed at [facility]   Culture     Final   Value:        BLOOD CULTURE RECEIVED NO GROWTH TO DATE CULTURE WILL BE HELD FOR 5 DAYS BEFORE ISSUING A FINAL NEGATIVE REPORT     Performed  at [facility]   Report Status PENDING   Incomplete     Studies: Dg Chest 2 View  06/07/2013   CLINICAL DATA:  Shortness of breath and chest pain  EXAM: CHEST  2 VIEW  COMPARISON:  01/2013  FINDINGS: Mild cardiac enlargement. Mild to moderate vascular congestion with no pulmonary edema. No consolidation or infiltrate. Minimal pleural effusions.  IMPRESSION: Findings suggest an element of fluid overload with vascular congestion but no current pulmonary edema.   Electronically Signed   By: Esperanza Heir M.D.   On: 06/07/2013 13:41   Ct Head Wo Contrast  06/07/2013   CLINICAL DATA:  Fall days ago, takes blood thinners  EXAM: CT HEAD WITHOUT CONTRAST  TECHNIQUE: Contiguous axial images were obtained from the base of the skull through the vertex without intravenous contrast.  COMPARISON:  12/31/2009  FINDINGS: Diffuse age-related atrophy. Diffuse bilateral low attenuation in the deep white matter. No evidence of hemorrhage or extra-axial fluid. No vascular territory infarct. Bilateral basal ganglia calcification. Calvarium is intact.  IMPRESSION: No acute abnormalities   Electronically Signed   By: Esperanza Heir M.D.   On: 06/07/2013 14:11   Ct Chest Wo Contrast  06/07/2013   CLINICAL DATA:  Shortness of breath. Cough and fever. History of tracheal bronchitis and COPD. History of lung nodule.  EXAM: CT CHEST WITHOUT CONTRAST  TECHNIQUE: Multidetector CT imaging of the chest was performed following the standard protocol without IV contrast.  COMPARISON:  Multiple exams, including 06/07/2013 and 06/02/2008  FINDINGS: Right paratracheal node short axis 1.3 cm, formerly 0.7 cm. A low right paratracheal lymph node short axis 1.4 cm, formerly 0.8 cm. Probable hilar adenopathy. Other nodes have also increased in size significantly.  Dense atherosclerosis.  This includes the coronary arteries.  Consolidation in the right lower lobe with surrounding faint nodularity and marginal ground-glass opacity.  Chronic lingular scarring. Faint ground-glass densities and volume loss in the left lower lobe.  Numerous chronic thoracic compression fractures are observed. Suspected emphysema. Old healed bilateral rib fractures anteriorly.  IMPRESSION: 1. Consolidation in the right lower lobe with new pathologic mediastinal adenopathy. Appearance favors bacterial pneumonia. Followup chest radiography is recommended in 4-6 weeks time to ensure resolution and exclude underlying malignancy. Alternatively CT would provide highly specific followup and allow direct assessment of the adenopathy for resolution. Patchy ground-glass opacities in the right lower lobe  and to a lesser extent in the left lower lobe are also likely inflammatory. 2. Mild atelectasis in the left lower lobe. Stable lingular scarring. 3. Atherosclerosis.   Electronically Signed   By: Herbie Baltimore M.D.   On: 06/07/2013 18:42    Scheduled Meds: . amLODipine  5 mg Oral Daily  . aspirin EC  81 mg Oral Daily  . budesonide-formoterol  2 puff Inhalation BID  . clopidogrel  75 mg Oral Daily  . diltiazem  120 mg Oral Q12H  . furosemide  60 mg Intravenous Q12H  . heparin  5,000 Units Subcutaneous BID  . insulin aspart  0-9 Units Subcutaneous TID WC  . insulin glargine  13 Units Subcutaneous Q breakfast  . insulin glargine  35 Units Subcutaneous QHS  . isosorbide mononitrate  30 mg Oral Daily  . [START ON 06/09/2013] levofloxacin (LEVAQUIN) IV  750 mg Intravenous Q48H  . levothyroxine  50 mcg Oral QAC breakfast  . lisinopril  10 mg Oral Daily  . montelukast  10 mg Oral QHS  . OxyCODONE  10 mg Oral Q12H  . pantoprazole  40 mg Oral Daily  . potassium chloride  40 mEq Oral BID  . sodium chloride  3 mL Intravenous Q12H  . sotalol  120 mg Oral Q12H   Continuous Infusions:   Principal Problem:   CAP (community acquired pneumonia) Active Problems:   DM   OBSTRUCTIVE SLEEP APNEA   Atrial fibrillation   COPD with acute exacerbation   CHF  (congestive heart failure), NYHA class II    Time spent: 35 minutes    HERNANDEZ ACOSTA,ESTELA  [facility] Pager [phone]  If 7PM-7AM, please contact night-coverage at [email or web address], password [facility] 06/08/2013, 10:42 AM  LOS: 1 day

## 2013-06-08 NOTE — Progress Notes (Signed)
INITIAL NUTRITION ASSESSMENT  DOCUMENTATION CODES Per approved criteria  -Not Applicable   INTERVENTION: Provide Glucerna Shakes BID Provide Snacks BID Changed diet order to include room service "with assist"  NUTRITION DIAGNOSIS: Inadequate oral intake related to poor appetite as evidenced by 5% wt loss in less than 2 months.   Goal: Pt to meet >/= 90% of their estimated nutrition needs   Monitor:  PO intake Weight Labs  Reason for Assessment: Malnutrition Screening Tool, score of 2  79 y.o. female  Admitting Dx: CAP (community acquired pneumonia)  ASSESSMENT: 79 y.o. female with PMH of COPD/chronic respiratory failure on home o2, CHF, PAF, CAD s/p PTCA/stent, IDDM, HTN, HPL, OSA on CPAP presented with SOB, productive cough, green/yellow sputum for 4-5 days, associated with fever, chills.  Pt reports having a poor appetite for the past 2 months. Pt states she has been eating a small muffin for breakfast, no lunch, and a small dinner. She usually weighs 195 lbs; pt reports losing 4 lbs PTA due to not eating and an additional 6 lbs since admission due to fluid losses. Pt reports eating just a few bites at her meals today; pt states she has no appetite but, she also hasn't liked the food. Pt reports she has not been choosing foods to order, she is being brought standard trays. Will change diet order to include assistance ordering food so that pt will receive foods she likes.   Height: Ht Readings from Last 1 Encounters:  06/07/13 4' 11.84" (1.52 m)    Weight: Wt Readings from Last 1 Encounters:  06/08/13 183 lb 10.3 oz (83.3 kg)    Ideal Body Weight: 100 lbs  % Ideal Body Weight: 183%  Wt Readings from Last 10 Encounters:  06/08/13 183 lb 10.3 oz (83.3 kg)  06/04/13 189 lb (85.73 kg)  04/23/13 192 lb 12.8 oz (87.454 kg)  02/27/13 193 lb (87.544 kg)  11/27/12 188 lb (85.276 kg)  09/08/12 193 lb 6.4 oz (87.726 kg)  01/11/12 196 lb 3.2 oz (88.996 kg)  07/13/11 205 lb  (92.987 kg)  01/11/11 215 lb 9.6 oz (97.796 kg)  09/14/10 214 lb 4 oz (97.183 kg)    Usual Body Weight: 195 lbs  % Usual Body Weight: 94%  BMI:  Body mass index is 36.05 kg/(m^2).  Estimated Nutritional Needs: Kcal: 1600-1800 Protein: 100-110 grams Fluid: 1.6-1.8 L/day  Skin: intact  Diet Order: Carb Control  EDUCATION NEEDS: -No education needs identified at this time   Intake/Output Summary (Last 24 hours) at 06/08/13 1556 Last data filed at 06/08/13 1451  Gross per 24 hour  Intake   2120 ml  Output   2250 ml  Net   -130 ml    Last BM: 10/24   Labs:   Recent Labs Lab 06/07/13 1300 06/08/13 0535  NA 133* 137  K 2.8* 3.9  CL 96 101  CO2 23 25  BUN 33* 29*  CREATININE 1.48* 1.47*  CALCIUM 9.4 9.1  GLUCOSE 175* 146*    CBG (last 3)   Recent Labs  06/07/13 2056 06/08/13 0737 06/08/13 1159  GLUCAP 159* 144* 152*    Scheduled Meds: . amLODipine  5 mg Oral Daily  . aspirin EC  81 mg Oral Daily  . budesonide-formoterol  2 puff Inhalation BID  . clopidogrel  75 mg Oral Daily  . diltiazem  120 mg Oral Q12H  . docusate sodium  100 mg Oral BID  . furosemide  60 mg Intravenous Q12H  .  heparin  5,000 Units Subcutaneous BID  . insulin aspart  0-9 Units Subcutaneous TID WC  . insulin glargine  13 Units Subcutaneous Q breakfast  . insulin glargine  35 Units Subcutaneous QHS  . isosorbide mononitrate  30 mg Oral Daily  . [START ON 06/09/2013] levofloxacin (LEVAQUIN) IV  750 mg Intravenous Q48H  . levothyroxine  50 mcg Oral QAC breakfast  . lisinopril  10 mg Oral Daily  . montelukast  10 mg Oral QHS  . OxyCODONE  10 mg Oral Q12H  . pantoprazole  40 mg Oral Daily  . polyethylene glycol  17 g Oral Daily  . potassium chloride  40 mEq Oral BID  . sodium chloride  3 mL Intravenous Q12H  . sotalol  120 mg Oral Q12H  . warfarin  5 mg Oral ONCE-1800  . warfarin   Does not apply Once  . Warfarin - Pharmacist Dosing Inpatient   Does not apply q1800     Continuous Infusions:   Past Medical History  Diagnosis Date  . CAD (coronary artery disease)   . CHF (congestive heart failure)   . Paroxysmal atrial fibrillation   . Bradycardia   . Hypertension   . Renal artery stenosis   . COPD (chronic obstructive pulmonary disease)   . Obesity hypoventilation syndrome   . Pruritus   . Tracheobronchitis   . Acute rhinosinusitis   . Anemia   . Hypoxemia   . Peripheral edema   . Obesity, endogenous   . Diabetes mellitus   . Acid reflux disease   . Osteoarthritis, generalized   . Hypothyroidism   . Lung nodule     lingula  . OSA (obstructive sleep apnea)     NPSG 04/03/00--AHI 28.hr  . DVT (deep venous thrombosis) 2003    left leg  . Vaginal atrophy   . Hx of colonic polyps   . Acute diastolic heart failure     Past Surgical History  Procedure Laterality Date  . Lumbar spine surgery    . Cholecystectomy    . Hemorroidectomy    . Repair wound fistula    . Right shoulder    . Elbow surgery    . Dilation and curettage of uterus    . Breast surgery  1998    lumpectomy  . Oophorectomy  1959    BSO-? ovarian cancer    Ian Malkin RD, LDN Inpatient Clinical Dietitian Pager: [phone] After Hours Pager: [phone]

## 2013-06-08 NOTE — Progress Notes (Signed)
       Patient Name: [name] Date of Encounter: 06/08/2013    SUBJECTIVE: Feels better today. Breathing is still not normal. She denies chest pain. No palpitations.  TELEMETRY:  Sinus rhythm/sinus bradycardia Filed Vitals:   06/07/13 2053 06/08/13 0500 06/08/13 1040 06/08/13 1450  BP: 129/80 145/56  130/43  Pulse: 95 58  47  Temp: 99 F (37.2 C) 97.8 F (36.6 C)    TempSrc: Oral Axillary    Resp: 24 22  18   Height:      Weight:  183 lb 10.3 oz (83.3 kg)    SpO2: 98% 100% 97% 98%    Intake/Output Summary (Last 24 hours) at 06/08/13 1841 Last data filed at 06/08/13 1700  Gross per 24 hour  Intake   2360 ml  Output   2500 ml  Net   -140 ml    LABS: Basic Metabolic Panel:  Recent Labs  16/10/96 1300 06/08/13 0535  NA 133* 137  K 2.8* 3.9  CL 96 101  CO2 23 25  GLUCOSE 175* 146*  BUN 33* 29*  CREATININE 1.48* 1.47*  CALCIUM 9.4 9.1   CBC:  Recent Labs  06/07/13 1300 06/08/13 0535  WBC 11.2* 9.8  NEUTROABS 9.6*  --   HGB 10.4* 9.0*  HCT 31.1* 27.5*  MCV 86.9 87.3  PLT 242 207   Cardiac Enzymes:  Recent Labs  06/07/13 1830 06/07/13 2220 06/08/13 0535  TROPONINI <0.30 <0.30 <0.30   Hemoglobin A1C:  Recent Labs  06/07/13 1830  HGBA1C 7.7*   Fasting Lipid Panel: No results found for this basename: CHOL, HDL, LDLCALC, TRIG, CHOLHDL, LDLDIRECT,  in the last 72 hours  Radiology/Studies:  06/07/13 chest x-ray ECHOCARDIOGRAM: Study Conclusions  - Left ventricle: Wall thickness was increased in a pattern of mild LVH. Systolic function was vigorous. The estimated ejection fraction was in the range of 65% to 70%. Features are consistent with a pseudonormal left ventricular filling pattern, with concomitant abnormal relaxation and increased filling pressure (grade 2 diastolic dysfunction). Doppler parameters are consistent with high ventricular filling pressure. - Aortic valve: Mild regurgitation. - Mitral valve: Severely  calcified annulus. Mild to moderate regurgitation. - Left atrium: The atrium was mildly dilated. - Tricuspid valve: Moderate regurgitation. - Pulmonary arteries: Systolic pressure was moderately increased. PA peak pressure: 42mm Hg (S).   IMPRESSION:  Findings suggest an element of fluid overload with vascular  congestion but no current pulmonary edema. Physical Exam: Blood pressure 130/43, pulse 47, temperature 97.8 F (36.6 C), temperature source Axillary, resp. rate 18, height 4' 11.84" (1.52 m), weight 183 lb 10.3 oz (83.3 kg), SpO2 98.00%. Weight change:    Neck veins are flat Chest is clear anteriorly Cardiac rhythm is regular without murmur or gallop.  ASSESSMENT:  1. Atrial fibrillation has reverted to normal sinus rhythm 2. Is not on anticoagulation as an outpatient, as sotalol was being used for rhythm control 3. Acute diastolic heart failure, improved  Plan: 1. CHADS score is greater than 2. Chronic anticoagulation should be considered if not contraindicated  2. Diuresis to remove any component of failure  Selinda Eon 06/08/2013, 6:41 PM

## 2013-06-08 NOTE — Progress Notes (Signed)
  Echocardiogram 2D Echocardiogram has been performed.  Cathie Beams 06/08/2013, 2:08 PM

## 2013-06-08 NOTE — Progress Notes (Signed)
Pt refuses to wear CPAP tonight. Pt in no distress at this time. Pt encouraged to call RT if pt changes mind.

## 2013-06-08 NOTE — Progress Notes (Addendum)
ANTICOAGULATION CONSULT NOTE - Initial Consult  Pharmacy Consult for warfarin Indication: atrial fibrillation  Allergies  Allergen Reactions  . Cefuroxime Axetil   . Cephalexin   . Clarithromycin   . Doxycycline     REACTION: rash on legs and feet, edema  . Erythromycin   . Tetracycline     Patient Measurements: Height: 4' 11.84" (152 cm) Weight: 183 lb 10.3 oz (83.3 kg) IBW/kg (Calculated) : 45.14 Heparin Dosing Weight:   Vital Signs: Temp: 97.8 F (36.6 C) (10/27 0500) Temp src: Axillary (10/27 0500) BP: 145/56 mmHg (10/27 0500) Pulse Rate: 58 (10/27 0500)  Labs:  Recent Labs  06/07/13 1300 06/07/13 1830 06/07/13 2220 06/08/13 0535  HGB 10.4*  --   --  9.0*  HCT 31.1*  --   --  27.5*  PLT 242  --   --  207  CREATININE 1.48*  --   --  1.47*  TROPONINI  --  <0.30 <0.30 <0.30    Estimated Creatinine Clearance: 30.6 ml/min (by C-G formula based on Cr of 1.47).   Medical History: Past Medical History  Diagnosis Date  . CAD (coronary artery disease)   . CHF (congestive heart failure)   . Paroxysmal atrial fibrillation   . Bradycardia   . Hypertension   . Renal artery stenosis   . COPD (chronic obstructive pulmonary disease)   . Obesity hypoventilation syndrome   . Pruritus   . Tracheobronchitis   . Acute rhinosinusitis   . Anemia   . Hypoxemia   . Peripheral edema   . Obesity, endogenous   . Diabetes mellitus   . Acid reflux disease   . Osteoarthritis, generalized   . Hypothyroidism   . Lung nodule     lingula  . OSA (obstructive sleep apnea)     NPSG 04/03/00--AHI 28.hr  . DVT (deep venous thrombosis) 2003    left leg  . Vaginal atrophy   . Hx of colonic polyps   . Acute diastolic heart failure     Assessment: 69 YOF presenting with SOB, in afib (h/o PAF but no anticoagulation).  Orders to d/c plavix and start warfarin.  Patient on SQ heparin q12h   Baseline INR: none  CBC: Hgb = 9, platelets WNL  Watch for possible interaction with  levofloxacin (may potentiate warfarin's effects)  Goal of Therapy:  INR 2-3 Monitor platelets by anticoagulation protocol: Yes   Plan:   Start warfarin 5mg  PO tonight  Daily INR (get 1st now)  D/C SQ heparin when INR approaches therapeutic range  Coumadin education materials  Juliette Alcide, PharmD, BCPS.   Pager: [phone]  06/08/2013,10:58 AM

## 2013-06-09 ENCOUNTER — Encounter (HOSPITAL_COMMUNITY): Payer: Self-pay | Admitting: Cardiology

## 2013-06-09 DIAGNOSIS — I2789 Other specified pulmonary heart diseases: Secondary | ICD-10-CM

## 2013-06-09 DIAGNOSIS — IMO0002 Reserved for concepts with insufficient information to code with codable children: Secondary | ICD-10-CM

## 2013-06-09 DIAGNOSIS — E871 Hypo-osmolality and hyponatremia: Secondary | ICD-10-CM

## 2013-06-09 DIAGNOSIS — D649 Anemia, unspecified: Secondary | ICD-10-CM

## 2013-06-09 DIAGNOSIS — I5033 Acute on chronic diastolic (congestive) heart failure: Principal | ICD-10-CM

## 2013-06-09 HISTORY — DX: Reserved for concepts with insufficient information to code with codable children: IMO0002

## 2013-06-09 LAB — GLUCOSE, CAPILLARY
Glucose-Capillary: 151 mg/dL — ABNORMAL HIGH (ref 70–99)
Glucose-Capillary: 133 mg/dL — ABNORMAL HIGH (ref 70–99)

## 2013-06-09 LAB — CBC
HCT: 28.3 % — ABNORMAL LOW (ref 36.0–46.0)
MCHC: 33.2 g/dL (ref 30.0–36.0)
Platelets: 252 10*3/uL (ref 150–400)
RBC: 3.25 MIL/uL — ABNORMAL LOW (ref 3.87–5.11)
RDW: 16.5 % — ABNORMAL HIGH (ref 11.5–15.5)
WBC: 9.1 10*3/uL (ref 4.0–10.5)

## 2013-06-09 LAB — BASIC METABOLIC PANEL
CO2: 23 mEq/L (ref 19–32)
Calcium: 9.4 mg/dL (ref 8.4–10.5)
Chloride: 97 mEq/L (ref 96–112)
Creatinine, Ser: 1.66 mg/dL — ABNORMAL HIGH (ref 0.50–1.10)
GFR calc Af Amer: 33 mL/min — ABNORMAL LOW (ref >90)
Sodium: 132 mEq/L — ABNORMAL LOW (ref 135–145)

## 2013-06-09 LAB — PROTIME-INR: INR: 1.26 (ref 0.00–1.49)

## 2013-06-09 MED ORDER — WARFARIN SODIUM 5 MG PO TABS
5.0000 mg | ORAL_TABLET | Freq: Once | ORAL | Status: AC
  Administered 2013-06-09: 5 mg via ORAL
  Filled 2013-06-09: qty 1

## 2013-06-09 MED ORDER — LEVOFLOXACIN 500 MG PO TABS
500.0000 mg | ORAL_TABLET | ORAL | Status: DC
  Administered 2013-06-09 – 2013-06-15 (×4): 500 mg via ORAL
  Filled 2013-06-09 (×4): qty 1

## 2013-06-09 NOTE — Progress Notes (Signed)
PT Cancellation Note  Patient Details Name: [name] MRN: [id number] DOB: 16-Jun-1934   Cancelled Treatment:    Reason Eval/Treat Not Completed: Pain limiting ability to participate. Will check back on tomorrow. Thanks.    Rebeca Alert, MPT Pager: [phone]

## 2013-06-09 NOTE — Progress Notes (Signed)
Subjective:  Still with trouble laying flat to sleep. No CP. Sitting up in chair.   Objective:  Vital Signs in the last 24 hours: Temp:  [97.9 F (36.6 C)-98.3 F (36.8 C)] 98.3 F (36.8 C) (10/28 0544) Pulse Rate:  [47-52] 50 (10/28 0544) Resp:  [18-24] 24 (10/28 0544) BP: (111-130)/(42-48) 126/43 mmHg (10/28 0544) SpO2:  [96 %-98 %] 98 % (10/28 0544) Weight:  [185 lb 1.6 oz (83.961 kg)] 185 lb 1.6 oz (83.961 kg) (10/28 0232)  Intake/Output from previous day: 10/27 0701 - 10/28 0700 In: 843 [P.O.:840; I.V.:3] Out: 1500 [Urine:1500]   Physical Exam: General: Elderly, Well developed, well nourished, in no acute distress. Head:  Normocephalic and atraumatic. Lungs: Diminished BS B, decreased air movement, no active wheeze. Heart:Brady Reg S1 and S2.  No murmur, rubs or gallops.  Abdomen: soft, non-tender, positive bowel sounds. Extremities: No clubbing or cyanosis. No edema. Neurologic: Alert and oriented x 3.    Lab Results:  Recent Labs  06/08/13 0535 06/09/13 0440  WBC 9.8 9.1  HGB 9.0* 9.4*  PLT 207 252    Recent Labs  06/08/13 0535 06/09/13 0440  NA 137 132*  K 3.9 4.9  CL 101 97  CO2 25 23  GLUCOSE 146* 187*  BUN 29* 34*  CREATININE 1.47* 1.66*    Recent Labs  06/07/13 2220 06/08/13 0535  TROPONINI <0.30 <0.30  Imaging: Dg Chest 2 View  06/07/2013   CLINICAL DATA:  Shortness of breath and chest pain  EXAM: CHEST  2 VIEW  COMPARISON:  01/2013  FINDINGS: Mild cardiac enlargement. Mild to moderate vascular congestion with no pulmonary edema. No consolidation or infiltrate. Minimal pleural effusions.  IMPRESSION: Findings suggest an element of fluid overload with vascular congestion but no current pulmonary edema.   Electronically Signed   By: Esperanza Heir M.D.   On: 06/07/2013 13:41   Ct Head Wo Contrast  06/07/2013   CLINICAL DATA:  Fall days ago, takes blood thinners  EXAM: CT HEAD WITHOUT CONTRAST  TECHNIQUE: Contiguous axial images were  obtained from the base of the skull through the vertex without intravenous contrast.  COMPARISON:  12/31/2009  FINDINGS: Diffuse age-related atrophy. Diffuse bilateral low attenuation in the deep white matter. No evidence of hemorrhage or extra-axial fluid. No vascular territory infarct. Bilateral basal ganglia calcification. Calvarium is intact.  IMPRESSION: No acute abnormalities   Electronically Signed   By: Esperanza Heir M.D.   On: 06/07/2013 14:11   Ct Chest Wo Contrast  06/07/2013   CLINICAL DATA:  Shortness of breath. Cough and fever. History of tracheal bronchitis and COPD. History of lung nodule.  EXAM: CT CHEST WITHOUT CONTRAST  TECHNIQUE: Multidetector CT imaging of the chest was performed following the standard protocol without IV contrast.  COMPARISON:  Multiple exams, including 06/07/2013 and 06/02/2008  FINDINGS: Right paratracheal node short axis 1.3 cm, formerly 0.7 cm. A low right paratracheal lymph node short axis 1.4 cm, formerly 0.8 cm. Probable hilar adenopathy. Other nodes have also increased in size significantly.  Dense atherosclerosis.  This includes the coronary arteries.  Consolidation in the right lower lobe with surrounding faint nodularity and marginal ground-glass opacity. Chronic lingular scarring. Faint ground-glass densities and volume loss in the left lower lobe.  Numerous chronic thoracic compression fractures are observed. Suspected emphysema. Old healed bilateral rib fractures anteriorly.  IMPRESSION: 1. Consolidation in the right lower lobe with new pathologic mediastinal adenopathy. Appearance favors bacterial pneumonia. Followup chest radiography is recommended  in 4-6 weeks time to ensure resolution and exclude underlying malignancy. Alternatively CT would provide highly specific followup and allow direct assessment of the adenopathy for resolution. Patchy ground-glass opacities in the right lower lobe and to a lesser extent in the left lower lobe are also likely  inflammatory. 2. Mild atelectasis in the left lower lobe. Stable lingular scarring. 3. Atherosclerosis.   Electronically Signed   By: Herbie Baltimore M.D.   On: 06/07/2013 18:42     Telemetry: Sinus brady, no afib, no pauses. Personally viewed.    Cardiac Studies:  ECHO 06/08/13:  - Left ventricle: Wall thickness was increased in a pattern of mild LVH. Systolic function was vigorous. The estimated ejection fraction was in the range of 65% to 70%. Features are consistent with a pseudonormal left ventricular filling pattern, with concomitant abnormal relaxation and increased filling pressure (grade 2 diastolic dysfunction). Doppler parameters are consistent with high ventricular filling pressure. - Aortic valve: Mild regurgitation. - Mitral valve: Severely calcified annulus. Mild to moderate regurgitation. - Left atrium: The atrium was mildly dilated. - Tricuspid valve: Moderate regurgitation. - Pulmonary arteries: Systolic pressure was moderately increased. PA peak pressure: 42mm Hg (S).   Assessment/Plan:  Principal Problem:   CAP (community acquired pneumonia) Active Problems:   DM   OBSTRUCTIVE SLEEP APNEA   Atrial fibrillation   COPD with acute exacerbation   CHF (congestive heart failure), NYHA class II  1) AFIB - PAF. Sotalol. Agree with anticoagulation. She is worried since 6 years ago had Hg down to 6 with unidentified cause. Monitor INR closely. Watch for bleeding. Discussed risk of stroke with her. Encouraged coumadin. Appreciate pharmacy assistance.   2) Chronic anticoagulation - as above.  3) Acute on chronic diastolic heart failure - currently lasix 80 IV Q 12. BUN and Creat rising. 1.48>>1.66.  She is still having orthopnea. Let's give one more day of IV lasix and check BMET in am. Rhythm control should help.  4) COPD - recent fever. Levaquin  5) CAD - prior LAD stent. Several years since placement. I will stop Plavix. Once INR is theraputic, with prior  unexplained bleeding 6 years ago she states, consider stopping ASA 81 as well and going with Warfarin only. No angina.   6) OSA - encourage CPAP. Part of AFIB treatment as well.   7) Hyponatremia - lasix. Monitor. Tomorrow should be changing to PO lasix.   8) Secondary pulmonary hypertension. From DD, COPD  Hyland Mollenkopf 06/09/2013, 8:18 AM

## 2013-06-09 NOTE — Progress Notes (Signed)
ANTICOAGULATION CONSULT NOTE  Pharmacy Consult for warfarin Indication: atrial fibrillation  Allergies  Allergen Reactions  . Cefuroxime Axetil   . Cephalexin   . Clarithromycin   . Doxycycline     REACTION: rash on legs and feet, edema  . Erythromycin   . Tetracycline     Patient Measurements: Height: 4' 11.84" (152 cm) Weight: 185 lb 1.6 oz (83.961 kg) IBW/kg (Calculated) : 45.14 Heparin Dosing Weight:   Vital Signs: Temp: 98.3 F (36.8 C) (10/28 0544) Temp src: Oral (10/28 0544) BP: 126/43 mmHg (10/28 0544) Pulse Rate: 50 (10/28 0544)  Labs:  Recent Labs  06/07/13 1300 06/07/13 1830 06/07/13 2220 06/08/13 0535 06/08/13 1140 06/09/13 0440  HGB 10.4*  --   --  9.0*  --  9.4*  HCT 31.1*  --   --  27.5*  --  28.3*  PLT 242  --   --  207  --  252  LABPROT  --   --   --   --  16.7* 15.5*  INR  --   --   --   --  1.39 1.26  CREATININE 1.48*  --   --  1.47*  --  1.66*  TROPONINI  --  <0.30 <0.30 <0.30  --   --     Estimated Creatinine Clearance: 27.2 ml/min (by C-G formula based on Cr of 1.66).   Medical History: Past Medical History  Diagnosis Date  . CAD (coronary artery disease)   . CHF (congestive heart failure)   . Paroxysmal atrial fibrillation   . Bradycardia   . Hypertension   . Renal artery stenosis   . COPD (chronic obstructive pulmonary disease)   . Obesity hypoventilation syndrome   . Pruritus   . Tracheobronchitis   . Acute rhinosinusitis   . Anemia   . Hypoxemia   . Peripheral edema   . Obesity, endogenous   . Diabetes mellitus   . Acid reflux disease   . Osteoarthritis, generalized   . Hypothyroidism   . Lung nodule     lingula  . OSA (obstructive sleep apnea)     NPSG 04/03/00--AHI 28.hr  . DVT (deep venous thrombosis) 2003    left leg  . Vaginal atrophy   . Hx of colonic polyps   . Acute diastolic heart failure     Assessment: 40 YOF presenting with SOB, in afib (h/o PAF but no anticoagulation).  Orders to d/c plavix and  start warfarin.  Patient on SQ heparin q12h   Baseline INR: 1.39  Today's INR = 1.26 (s/p warfarin 5mg  x 1)  CBC: Hgb = 9.4, platelets WNL  Watch for possible interaction with levofloxacin (may potentiate warfarin's effects)  Goal of Therapy:  INR 2-3 Monitor platelets by anticoagulation protocol: Yes   Plan:   Repeat warfarin 5mg  PO tonight  Monitor for interaction with levofloxacin  Daily INR   D/C SQ heparin when INR approaches therapeutic range  Notes say d/c plavix but remains active order  Levofloxacin D#3 for ? PNA  SCr trending up and CrCl now <34ml/min, change to levofloxacin 500mg  q48h (meets criteria to change to PO)  Juliette Alcide, PharmD, BCPS.   Pager: [phone]  06/09/2013,8:08 AM

## 2013-06-09 NOTE — Progress Notes (Signed)
[facility] PROGRESS NOTE  Raelle Chambers Slevin ZOX:[id number] DOB: 1934-08-11 DOA: 06/07/2013 PCP: Gweneth Dimitri, MD  Assessment/Plan: SOB -Likely 2/2 CAP and acute diastolic CHF. -See below for details. -Improved today. -Will start to wean oxygen as she is not oxygen dependant at baseline.  CAP -Continue levaquin for now. -Cx data remains negative to date.  Acute Diastolic CHF -Last ECHO from 2009 with EF of 65%. -Will re order. -Continue lasix as ordered by cards: 60 mg IV BID. Watch renal function as it is starting to increase. -Strive for negative fluid balance. -Is 977 cc negative since admission. -Will DC KCl supplementation as K is 4.9 and she is also on an ACE-I.  Atrial Fibrillation -DC Plavix and start on warfarin. -No need for bridging. -Is on sotalol but watch with rising Cr.  IDDM -Fair control. -Continue current regimen.  Hypothyroidism -TSH ok. -Continue synthroid.  CKD Stage III -Cr is rising. -Follow with diuresis. -Recheck BMET in am.  Code Status: Full Code Family Communication: Patient only  Disposition Plan: Anticipate home when medically stable. Will obtain PT/OT evals.   Consultants:  Cardiology   Antibiotics:  Levaquin 06/07/2013-->   Subjective: SOB and CP have improved. She feels much better today. Continues to have cough productive of yellowish-green sputum.  Objective: Filed Vitals:   06/08/13 2058 06/08/13 2156 06/09/13 0232 06/09/13 0544  BP: 111/42 128/48  126/43  Pulse: 52 51  50  Temp: 97.9 F (36.6 C)   98.3 F (36.8 C)  TempSrc: Oral   Oral  Resp: 24   24  Height:      Weight:   83.961 kg (185 lb 1.6 oz)   SpO2: 96%   98%    Intake/Output Summary (Last 24 hours) at 06/09/13 1028 Last data filed at 06/09/13 0800  Gross per 24 hour  Intake    843 ml  Output   1450 ml  Net   -607 ml   Filed Weights   06/07/13 1638 06/08/13 0500 06/09/13 0232  Weight: 83.8 kg (184 lb 11.9 oz) 83.3 kg (183 lb 10.3  oz) 83.961 kg (185 lb 1.6 oz)    Exam:   General:  AA Ox3  Cardiovascular: irregular  Respiratory: No wheezing, mild bibasilar crackles  Abdomen: obese/S/NT/ND/+BS  Extremities: 1+edema bilaterally   Neurologic:  Grossly intact and non-focal.  Data Reviewed: Basic Metabolic Panel:  Recent Labs Lab 06/07/13 1300 06/08/13 0535 06/09/13 0440  NA 133* 137 132*  K 2.8* 3.9 4.9  CL 96 101 97  CO2 23 25 23   GLUCOSE 175* 146* 187*  BUN 33* 29* 34*  CREATININE 1.48* 1.47* 1.66*  CALCIUM 9.4 9.1 9.4   Liver Function Tests: No results found for this basename: AST, ALT, ALKPHOS, BILITOT, PROT, ALBUMIN,  in the last 168 hours No results found for this basename: LIPASE, AMYLASE,  in the last 168 hours No results found for this basename: AMMONIA,  in the last 168 hours CBC:  Recent Labs Lab 06/07/13 1300 06/08/13 0535 06/09/13 0440  WBC 11.2* 9.8 9.1  NEUTROABS 9.6*  --   --   HGB 10.4* 9.0* 9.4*  HCT 31.1* 27.5* 28.3*  MCV 86.9 87.3 87.1  PLT 242 207 252   Cardiac Enzymes:  Recent Labs Lab 06/07/13 1830 06/07/13 2220 06/08/13 0535  TROPONINI <0.30 <0.30 <0.30   BNP (last 3 results)  Recent Labs  06/07/13 1300  PROBNP 3554.0*   CBG:  Recent Labs Lab 06/08/13 0737 06/08/13 1159 06/08/13 1716 06/08/13 2100  06/09/13 0759  GLUCAP 144* 152* 169* 146* 175*    Recent Results (from the past 240 hour(s))  CULTURE, BLOOD (ROUTINE X 2)     Status: None   Collection Time    06/07/13  1:00 PM      Result Value Range Status   Specimen Description BLOOD RIGHT ARM  5 ML IN [facility] BOTTLE   Final   Special Requests NONE   Final   Culture  Setup Time     Final   Value: 06/07/2013 17:46     Performed at [facility]   Culture     Final   Value:        BLOOD CULTURE RECEIVED NO GROWTH TO DATE CULTURE WILL BE HELD FOR 5 DAYS BEFORE ISSUING A FINAL NEGATIVE REPORT     Performed at [facility]   Report Status PENDING   Incomplete  CULTURE, BLOOD  (ROUTINE X 2)     Status: None   Collection Time    06/07/13  1:45 PM      Result Value Range Status   Specimen Description BLOOD LEFT ARM  8 ML IN [facility] BOTTLE   Final   Special Requests NONE   Final   Culture  Setup Time     Final   Value: 06/07/2013 17:45     Performed at [facility]   Culture     Final   Value:        BLOOD CULTURE RECEIVED NO GROWTH TO DATE CULTURE WILL BE HELD FOR 5 DAYS BEFORE ISSUING A FINAL NEGATIVE REPORT     Performed at [facility]   Report Status PENDING   Incomplete     Studies: Dg Chest 2 View  06/07/2013   CLINICAL DATA:  Shortness of breath and chest pain  EXAM: CHEST  2 VIEW  COMPARISON:  01/2013  FINDINGS: Mild cardiac enlargement. Mild to moderate vascular congestion with no pulmonary edema. No consolidation or infiltrate. Minimal pleural effusions.  IMPRESSION: Findings suggest an element of fluid overload with vascular congestion but no current pulmonary edema.   Electronically Signed   By: Esperanza Heir M.D.   On: 06/07/2013 13:41   Ct Head Wo Contrast  06/07/2013   CLINICAL DATA:  Fall days ago, takes blood thinners  EXAM: CT HEAD WITHOUT CONTRAST  TECHNIQUE: Contiguous axial images were obtained from the base of the skull through the vertex without intravenous contrast.  COMPARISON:  12/31/2009  FINDINGS: Diffuse age-related atrophy. Diffuse bilateral low attenuation in the deep white matter. No evidence of hemorrhage or extra-axial fluid. No vascular territory infarct. Bilateral basal ganglia calcification. Calvarium is intact.  IMPRESSION: No acute abnormalities   Electronically Signed   By: Esperanza Heir M.D.   On: 06/07/2013 14:11   Ct Chest Wo Contrast  06/07/2013   CLINICAL DATA:  Shortness of breath. Cough and fever. History of tracheal bronchitis and COPD. History of lung nodule.  EXAM: CT CHEST WITHOUT CONTRAST  TECHNIQUE: Multidetector CT imaging of the chest was performed following the standard protocol without IV  contrast.  COMPARISON:  Multiple exams, including 06/07/2013 and 06/02/2008  FINDINGS: Right paratracheal node short axis 1.3 cm, formerly 0.7 cm. A low right paratracheal lymph node short axis 1.4 cm, formerly 0.8 cm. Probable hilar adenopathy. Other nodes have also increased in size significantly.  Dense atherosclerosis.  This includes the coronary arteries.  Consolidation in the right lower lobe with surrounding faint nodularity and marginal ground-glass  opacity. Chronic lingular scarring. Faint ground-glass densities and volume loss in the left lower lobe.  Numerous chronic thoracic compression fractures are observed. Suspected emphysema. Old healed bilateral rib fractures anteriorly.  IMPRESSION: 1. Consolidation in the right lower lobe with new pathologic mediastinal adenopathy. Appearance favors bacterial pneumonia. Followup chest radiography is recommended in 4-6 weeks time to ensure resolution and exclude underlying malignancy. Alternatively CT would provide highly specific followup and allow direct assessment of the adenopathy for resolution. Patchy ground-glass opacities in the right lower lobe and to a lesser extent in the left lower lobe are also likely inflammatory. 2. Mild atelectasis in the left lower lobe. Stable lingular scarring. 3. Atherosclerosis.   Electronically Signed   By: Herbie Baltimore M.D.   On: 06/07/2013 18:42    Scheduled Meds: . amLODipine  5 mg Oral Daily  . aspirin EC  81 mg Oral Daily  . budesonide-formoterol  2 puff Inhalation BID  . diltiazem  120 mg Oral Q12H  . docusate sodium  100 mg Oral BID  . feeding supplement (GLUCERNA SHAKE)  237 mL Oral BID PC  . furosemide  80 mg Intravenous Q12H  . heparin  5,000 Units Subcutaneous BID  . insulin aspart  0-9 Units Subcutaneous TID WC  . insulin glargine  13 Units Subcutaneous Q breakfast  . insulin glargine  35 Units Subcutaneous QHS  . isosorbide mononitrate  30 mg Oral Daily  . levofloxacin  500 mg Oral Q48H  .  levothyroxine  50 mcg Oral QAC breakfast  . lisinopril  10 mg Oral Daily  . montelukast  10 mg Oral QHS  . OxyCODONE  10 mg Oral Q12H  . pantoprazole  40 mg Oral Daily  . polyethylene glycol  17 g Oral Daily  . potassium chloride  40 mEq Oral BID  . sodium chloride  3 mL Intravenous Q12H  . sotalol  120 mg Oral Q12H  . warfarin  5 mg Oral ONCE-1800  . Warfarin - Pharmacist Dosing Inpatient   Does not apply q1800   Continuous Infusions:   Principal Problem:   CAP (community acquired pneumonia) Active Problems:   DM   OBSTRUCTIVE SLEEP APNEA   Atrial fibrillation   COPD with acute exacerbation   CHF (congestive heart failure), NYHA class II   Secondary pulmonary hypertension   Hyposmolality and/or hyponatremia   Acute on chronic diastolic heart failure    Time spent: 35 minutes    HERNANDEZ ACOSTA,Daana Petrasek  [facility] Pager [phone]  If 7PM-7AM, please contact night-coverage at [email or web address], password [facility] 06/09/2013, 10:28 AM  LOS: 2 days

## 2013-06-09 NOTE — Progress Notes (Signed)
Pt has refused CPAP for the night, RT to monitor and assess as needed.  

## 2013-06-10 DIAGNOSIS — E119 Type 2 diabetes mellitus without complications: Secondary | ICD-10-CM

## 2013-06-10 DIAGNOSIS — E039 Hypothyroidism, unspecified: Secondary | ICD-10-CM

## 2013-06-10 DIAGNOSIS — I10 Essential (primary) hypertension: Secondary | ICD-10-CM

## 2013-06-10 LAB — CBC
Hemoglobin: 9.6 g/dL — ABNORMAL LOW (ref 12.0–15.0)
MCH: 28.3 pg (ref 26.0–34.0)
MCHC: 32.5 g/dL (ref 30.0–36.0)
MCV: 87 fL (ref 78.0–100.0)
RBC: 3.39 MIL/uL — ABNORMAL LOW (ref 3.87–5.11)

## 2013-06-10 LAB — BASIC METABOLIC PANEL
BUN: 42 mg/dL — ABNORMAL HIGH (ref 6–23)
CO2: 27 mEq/L (ref 19–32)
Chloride: 95 mEq/L — ABNORMAL LOW (ref 96–112)
Creatinine, Ser: 1.88 mg/dL — ABNORMAL HIGH (ref 0.50–1.10)
GFR calc Af Amer: 29 mL/min — ABNORMAL LOW (ref >90)
Glucose, Bld: 181 mg/dL — ABNORMAL HIGH (ref 70–99)
Potassium: 4.9 mEq/L (ref 3.5–5.1)
Sodium: 133 mEq/L — ABNORMAL LOW (ref 135–145)

## 2013-06-10 LAB — GLUCOSE, CAPILLARY: Glucose-Capillary: 110 mg/dL — ABNORMAL HIGH (ref 70–99)

## 2013-06-10 MED ORDER — CLOPIDOGREL BISULFATE 75 MG PO TABS
75.0000 mg | ORAL_TABLET | Freq: Every day | ORAL | Status: DC
  Administered 2013-06-11 – 2013-06-16 (×6): 75 mg via ORAL
  Filled 2013-06-10 (×8): qty 1

## 2013-06-10 MED ORDER — WARFARIN SODIUM 7.5 MG PO TABS
7.5000 mg | ORAL_TABLET | Freq: Once | ORAL | Status: DC
  Filled 2013-06-10: qty 1

## 2013-06-10 MED ORDER — SOTALOL HCL 120 MG PO TABS
120.0000 mg | ORAL_TABLET | Freq: Every day | ORAL | Status: DC
  Administered 2013-06-10 – 2013-06-13 (×4): 120 mg via ORAL
  Filled 2013-06-10 (×5): qty 1

## 2013-06-10 NOTE — Progress Notes (Addendum)
ANTICOAGULATION CONSULT NOTE  Pharmacy Consult for warfarin Indication: atrial fibrillation  Allergies  Allergen Reactions  . Cefuroxime Axetil   . Cephalexin   . Clarithromycin   . Doxycycline     REACTION: rash on legs and feet, edema  . Erythromycin   . Tetracycline     Patient Measurements: Height: 4' 11.84" (152 cm) Weight: 181 lb 7 oz (82.3 kg) IBW/kg (Calculated) : 45.14 Heparin Dosing Weight:   Vital Signs: Temp: 97.9 F (36.6 C) (10/29 0617) Temp src: Oral (10/29 0617) BP: 142/45 mmHg (10/29 0617) Pulse Rate: 51 (10/29 0617)  Labs:  Recent Labs  06/07/13 1830 06/07/13 2220 06/08/13 0535 06/08/13 1140 06/09/13 0440 06/10/13 0526  HGB  --   --  9.0*  --  9.4* 9.6*  HCT  --   --  27.5*  --  28.3* 29.5*  PLT  --   --  207  --  252 302  LABPROT  --   --   --  16.7* 15.5* 14.8  INR  --   --   --  1.39 1.26 1.19  CREATININE  --   --  1.47*  --  1.66* 1.88*  TROPONINI <0.30 <0.30 <0.30  --   --   --     Estimated Creatinine Clearance: 23.7 ml/min (by C-G formula based on Cr of 1.88).   Medical History: Past Medical History  Diagnosis Date  . CAD (coronary artery disease)   . CHF (congestive heart failure)   . Paroxysmal atrial fibrillation   . Bradycardia   . Hypertension   . Renal artery stenosis   . COPD (chronic obstructive pulmonary disease)   . Obesity hypoventilation syndrome   . Pruritus   . Tracheobronchitis   . Acute rhinosinusitis   . Anemia   . Hypoxemia   . Peripheral edema   . Obesity, endogenous   . Diabetes mellitus   . Acid reflux disease   . Osteoarthritis, generalized   . Hypothyroidism   . Lung nodule     lingula  . OSA (obstructive sleep apnea)     NPSG 04/03/00--AHI 28.hr  . DVT (deep venous thrombosis) 2003    left leg  . Vaginal atrophy   . Hx of colonic polyps   . Acute diastolic heart failure   . Secondary pulmonary hypertension 06/09/2013    Assessment: 52 YOF presenting with SOB, in afib (h/o PAF but no  anticoagulation).  Orders to d/c plavix and start warfarin.  Patient on SQ heparin q12h   Baseline INR: 1.39  Today's INR = 1.19 (INR decreased overnight) (s/p warfarin 5mg  x 2)  CBC: Hgb = 9.6, platelets WNL  Watch for possible interaction with levofloxacin (may potentiate warfarin's effects)  On heparin SQ  During warfarin education patient shared that she falls at home, states fallen ~10x this year (relayed info to [facility] - to get PT evaluation -pending)  Goal of Therapy:  INR 2-3 Monitor platelets by anticoagulation protocol: Yes   Plan:   During warfarin education 10/28, patient informed me that she is prone to falling d/t sciatica and weakness.  She stated she has fallen at least 10 times this year.  I have discussed falls with [facility] and cardiology, plan is to stop warfarin for now and resume ASA/Plavix d/t concerns of falls.   Levofloxacin D#4 for ? PNA  SCr trending up and CrCl now <24ml/min, Continue  levofloxacin 500mg  q48h  Juliette Alcide, PharmD, BCPS.   Pager: [phone]  06/10/2013,8:27 AM

## 2013-06-10 NOTE — Evaluation (Signed)
Occupational Therapy Evaluation Patient Details Name: [name] MRN: [id number] DOB: Dec 26, 1933 Today's Date: 06/10/2013 Time: 1110-1130 OT Time Calculation (min): 20 min  OT Assessment / Plan / Recommendation History of present illness [name] is a 79 y.o. female with PMH of COPD/chronic respiratory failure on home o2, CHF, PAF, CAD s/p PTCA/stent, IDDM, HTN, HPL, OSA on CPAP presented with SOB, productive cough, green/yellow sputum for 4-5 days, associated with fever, chills; denies exertional symptoms, no chest pain, no nausea, vomiting or diarrhea; no focal neuro symptoms;     Clinical Impression   Pt is motivated to return to independence. She states she feels somewhat more fatigued than her baseline. Began education energy conservation techniques. Has husband and family that can assist PRN. Feel she will benefit from skilled OT services while on acute to continue energy conservation education and increase independence with self care.    OT Assessment  Patient needs continued OT Services    Follow Up Recommendations  Supervision/Assistance - 24 hour;Home health OT    Barriers to Discharge      Equipment Recommendations  None recommended by OT    Recommendations for Other Services    Frequency  Min 2X/week    Precautions / Restrictions Precautions Precautions: Fall Precaution Comments: O2 dependent Restrictions Weight Bearing Restrictions: No   Pertinent Vitals/Pain Sciatica pain 5/10; reposition, informed nursing    ADL  Eating/Feeding: Simulated;Independent Where Assessed - Eating/Feeding: Chair Grooming: Simulated;Set up Where Assessed - Grooming: Supported sitting Upper Body Bathing: Simulated;Chest;Right arm;Left arm;Abdomen;Supervision/safety;Set up Where Assessed - Upper Body Bathing: Unsupported sitting Lower Body Bathing: Simulated;Minimal assistance Where Assessed - Lower Body Bathing: Supported sit to stand Upper Body Dressing:  Simulated;Set up;Supervision/safety Where Assessed - Upper Body Dressing: Unsupported sitting Lower Body Dressing: Simulated;Minimal assistance (help with socks) Where Assessed - Lower Body Dressing: Supported sit to stand Toilet Transfer: Simulated;Minimal assistance Toilet Transfer Method: Stand pivot Toileting - Clothing Manipulation and Hygiene: Simulated;Minimal assistance;Min guard Where Assessed - Toileting Clothing Manipulation and Hygiene: Standing ADL Comments: Discussed purse lip breathing technique. Husband and sister in law are at home and can assist PRN. Pt is motivated to return to independence. She has a built in shower seat but hasnt needed PTA. encouraged her to use PRN if SOB.     OT Diagnosis: Generalized weakness  OT Problem List: Decreased strength;Decreased knowledge of use of DME or AE OT Treatment Interventions: Self-care/ADL training;DME and/or AE instruction;Therapeutic activities;Patient/family education   OT Goals(Current goals can be found in the care plan section) Acute Rehab OT Goals Patient Stated Goal: wants to be independent OT Goal Formulation: With patient Time For Goal Achievement: 06/24/13 Potential to Achieve Goals: Good  Visit Information  Last OT Received On: 06/10/13 Assistance Needed: +1 History of Present Illness: [name] is a 79 y.o. female with PMH of COPD/chronic respiratory failure on home o2, CHF, PAF, CAD s/p PTCA/stent, IDDM, HTN, HPL, OSA on CPAP presented with SOB, productive cough, green/yellow sputum for 4-5 days, associated with fever, chills; denies exertional symptoms, no chest pain, no nausea, vomiting or diarrhea; no focal neuro symptoms;         Prior Functioning     Home Living Family/patient expects to be discharged to:: Private residence Living Arrangements: Spouse/significant other;Other relatives Available Help at Discharge: Family Type of Home: House Home Access: Stairs to enter Entrance  Stairs-Number of Steps: 1 Home Layout: Multi-level Alternate Level Stairs-Number of Steps: chair lift to second floor bedroom. family stays on 3rd  level. Home Equipment: Walker - 4 wheels;Walker - standard;Shower seat - built in Additional Comments: uses 4 wheel walker when she goes out of house. Uses 2L O2 at home. Furniture holds to get around and also reports using standard walker to get to chair lift?? Prior Function Level of Independence: Independent with assistive device(s);Needs assistance ADL's / Homemaking Assistance Needed: husband assists wtih socks Communication Communication: No difficulties         Vision/Perception     Cognition  Cognition Arousal/Alertness: Awake/alert Behavior During Therapy: WFL for tasks assessed/performed Overall Cognitive Status: Within Functional Limits for tasks assessed    Extremity/Trunk Assessment Upper Extremity Assessment Upper Extremity Assessment: Overall WFL for tasks assessed     Mobility Bed Mobility Bed Mobility: Supine to Sit Supine to Sit: 5: Supervision;HOB elevated Transfers Transfers: Sit to Stand;Stand to Sit Sit to Stand: 4: Min guard;With upper extremity assist;From bed Stand to Sit: 4: Min assist;With upper extremity assist;To chair/3-in-1 Details for Transfer Assistance: min verbal cues for safety     Exercise     Balance Balance Balance Assessed: Yes Dynamic Standing Balance Dynamic Standing - Level of Assistance: 4: Min assist;5: Stand by assistance   End of Session OT - End of Session Activity Tolerance: Patient tolerated treatment well Patient left: in chair;with call bell/phone within reach;with chair alarm set  GO     [name] [phone] 06/10/2013, 11:48 AM

## 2013-06-10 NOTE — Progress Notes (Signed)
SUBJECTIVE:    OBJECTIVE:   Vitals:   Filed Vitals:   06/10/13 1011 06/10/13 1115 06/10/13 1120 06/10/13 1350  BP: 139/45   146/49  Pulse:  54 62 56  Temp:    98.3 F (36.8 C)  TempSrc:    Oral  Resp:    20  Height:      Weight:      SpO2:  95% 96% 97%   I&O's:   Intake/Output Summary (Last 24 hours) at 06/10/13 1851 Last data filed at 06/10/13 1351  Gross per 24 hour  Intake    420 ml  Output   3525 ml  Net  -3105 ml   TELEMETRY: Reviewed telemetry pt in sinus bradycardia:     PHYSICAL EXAM General: Well developed, well nourished, ill appearing Head: Eyes PERRLA, No xanthomas.   Normal cephalic and atramatic  Lungs:  No wheezing Heart:   HRRR S1 S2 . No JVD.   Abdomen: obese Msk:  Back normal, normal gait. Normal strength and tone for age. Extremities:  No edema.  DP +1 Neuro: Alert and oriented X 3. Psych:  Normal affect, responds appropriately   LABS: Basic Metabolic Panel:  Recent Labs  16/10/96 0440 06/10/13 0526  NA 132* 133*  K 4.9 4.9  CL 97 95*  CO2 23 27  GLUCOSE 187* 181*  BUN 34* 42*  CREATININE 1.66* 1.88*  CALCIUM 9.4 9.3   Liver Function Tests: No results found for this basename: AST, ALT, ALKPHOS, BILITOT, PROT, ALBUMIN,  in the last 72 hours No results found for this basename: LIPASE, AMYLASE,  in the last 72 hours CBC:  Recent Labs  06/09/13 0440 06/10/13 0526  WBC 9.1 6.4  HGB 9.4* 9.6*  HCT 28.3* 29.5*  MCV 87.1 87.0  PLT 252 302   Cardiac Enzymes:  Recent Labs  06/07/13 2220 06/08/13 0535  TROPONINI <0.30 <0.30   BNP: No components found with this basename: POCBNP,  D-Dimer: No results found for this basename: DDIMER,  in the last 72 hours Hemoglobin A1C: No results found for this basename: HGBA1C,  in the last 72 hours Fasting Lipid Panel: No results found for this basename: CHOL, HDL, LDLCALC, TRIG, CHOLHDL, LDLDIRECT,  in the last 72 hours Thyroid Function Tests: No results found for this basename: TSH,  T4TOTAL, FREET3, T3FREE, THYROIDAB,  in the last 72 hours Anemia Panel: No results found for this basename: VITAMINB12, FOLATE, FERRITIN, TIBC, IRON, RETICCTPCT,  in the last 72 hours Coag Panel:   Lab Results  Component Value Date   INR 1.19 06/10/2013   INR 1.26 06/09/2013   INR 1.39 06/08/2013    RADIOLOGY: Dg Chest 2 View  06/07/2013   CLINICAL DATA:  Shortness of breath and chest pain  EXAM: CHEST  2 VIEW  COMPARISON:  01/2013  FINDINGS: Mild cardiac enlargement. Mild to moderate vascular congestion with no pulmonary edema. No consolidation or infiltrate. Minimal pleural effusions.  IMPRESSION: Findings suggest an element of fluid overload with vascular congestion but no current pulmonary edema.   Electronically Signed   By: Esperanza Heir M.D.   On: 06/07/2013 13:41   Ct Head Wo Contrast  06/07/2013   CLINICAL DATA:  Fall days ago, takes blood thinners  EXAM: CT HEAD WITHOUT CONTRAST  TECHNIQUE: Contiguous axial images were obtained from the base of the skull through the vertex without intravenous contrast.  COMPARISON:  12/31/2009  FINDINGS: Diffuse age-related atrophy. Diffuse bilateral low attenuation in the deep white matter. No  evidence of hemorrhage or extra-axial fluid. No vascular territory infarct. Bilateral basal ganglia calcification. Calvarium is intact.  IMPRESSION: No acute abnormalities   Electronically Signed   By: Esperanza Heir M.D.   On: 06/07/2013 14:11   Ct Chest Wo Contrast  06/07/2013   CLINICAL DATA:  Shortness of breath. Cough and fever. History of tracheal bronchitis and COPD. History of lung nodule.  EXAM: CT CHEST WITHOUT CONTRAST  TECHNIQUE: Multidetector CT imaging of the chest was performed following the standard protocol without IV contrast.  COMPARISON:  Multiple exams, including 06/07/2013 and 06/02/2008  FINDINGS: Right paratracheal node short axis 1.3 cm, formerly 0.7 cm. A low right paratracheal lymph node short axis 1.4 cm, formerly 0.8 cm. Probable  hilar adenopathy. Other nodes have also increased in size significantly.  Dense atherosclerosis.  This includes the coronary arteries.  Consolidation in the right lower lobe with surrounding faint nodularity and marginal ground-glass opacity. Chronic lingular scarring. Faint ground-glass densities and volume loss in the left lower lobe.  Numerous chronic thoracic compression fractures are observed. Suspected emphysema. Old healed bilateral rib fractures anteriorly.  IMPRESSION: 1. Consolidation in the right lower lobe with new pathologic mediastinal adenopathy. Appearance favors bacterial pneumonia. Followup chest radiography is recommended in 4-6 weeks time to ensure resolution and exclude underlying malignancy. Alternatively CT would provide highly specific followup and allow direct assessment of the adenopathy for resolution. Patchy ground-glass opacities in the right lower lobe and to a lesser extent in the left lower lobe are also likely inflammatory. 2. Mild atelectasis in the left lower lobe. Stable lingular scarring. 3. Atherosclerosis.   Electronically Signed   By: Herbie Baltimore M.D.   On: 06/07/2013 18:42      ASSESSMENT: acute on chronic diastolic heart failure; CKD, AFib, CAD, frequent falls  PLAN:  Discussed plans of anticoagulation with pharmacy.  Patient reports 10 falls this year.  Will cancel plans for coumadin and restart plavix.  She is back in NSR.  I think it was the respiratory infection that prompted the AFib.  She prefers not to be on Coumadin as well.  Decreasing sotalol to once a day while Creatinine increased.  When kidney function better, can go back to BID Sotalol.  Holding Lasix.  BMet in AM.  AFib, back in NSR.  Sotalol as above.  CAD. DES to LAD.  No angina.  Corky Crafts., MD  06/10/2013  6:51 PM

## 2013-06-10 NOTE — Evaluation (Signed)
I have reviewed this note and agree with all findings. Kati Kesley Gaffey, PT, DPT Pager: [phone]   

## 2013-06-10 NOTE — Progress Notes (Signed)
NUTRITION FOLLOW UP  Intervention:   Continue Glucerna Shakes BID Continue Snacks BID  Nutrition Dx:   Inadequate oral intake related to poor appetite as evidenced by 5% wt loss in less than 2 months; ongoing  Goal:   Pt to meet >/= 90% of their estimated nutrition needs; likely being met with supplements  Monitor:   PO intake; 50% of most meals, Glucerna BID, and snacks BID Weight; 2 lb wt loss since 10/27, 8 lb wt loss since admission (?fluid) Labs; low sodium, high creatinine (trending up), low GFR (trending down), Blood Glucose 133-175  Assessment:   79 y.o. female with PMH of COPD/chronic respiratory failure on home o2, CHF, PAF, CAD s/p PTCA/stent, IDDM, HTN, HPL, OSA on CPAP presented with SOB, productive cough, green/yellow sputum for 4-5 days, associated with fever, chills. 10/27: Pt reports having a poor appetite for the past 2 months. Pt states she has been eating a small muffin for breakfast, no lunch, and a small dinner. She usually weighs 195 lbs; pt reports losing 4 lbs PTA due to not eating and an additional 6 lbs since admission due to fluid losses. Pt reports eating just a few bites at her meals today; pt states she has no appetite but, she also hasn't liked the food. Pt reports she has not been choosing foods to order, she is being brought standard trays. Will change diet order to include assistance ordering food so that pt will receive foods she likes.   10/29: Pt states that her appetite is slightly better. She is not a fan of the Glucerna Shakes but, has been drinking them twice daily. Pt appreciative of the snacks. Weight decreased another 2 lbs in the past 2 days.   Height: Ht Readings from Last 1 Encounters:  06/07/13 4' 11.84" (1.52 m)    Weight Status:   Wt Readings from Last 1 Encounters:  06/10/13 181 lb 7 oz (82.3 kg)    Re-estimated needs:  Kcal: 1600-1800  Protein: 100-110 grams  Fluid: 1.6-1.8 L/day  Skin: intact  Diet Order: Carb  Control   Intake/Output Summary (Last 24 hours) at 06/10/13 1600 Last data filed at 06/10/13 1351  Gross per 24 hour  Intake    420 ml  Output   3525 ml  Net  -3105 ml    Last BM: 10/28   Labs:   Recent Labs Lab 06/08/13 0535 06/09/13 0440 06/10/13 0526  NA 137 132* 133*  K 3.9 4.9 4.9  CL 101 97 95*  CO2 25 23 27   BUN 29* 34* 42*  CREATININE 1.47* 1.66* 1.88*  CALCIUM 9.1 9.4 9.3  GLUCOSE 146* 187* 181*    CBG (last 3)   Recent Labs  06/09/13 2035 06/10/13 0723 06/10/13 1138  GLUCAP 133* 134* 175*    Scheduled Meds: . amLODipine  5 mg Oral Daily  . aspirin EC  81 mg Oral Daily  . budesonide-formoterol  2 puff Inhalation BID  . [START ON 06/11/2013] clopidogrel  75 mg Oral Q breakfast  . diltiazem  120 mg Oral Q12H  . docusate sodium  100 mg Oral BID  . feeding supplement (GLUCERNA SHAKE)  237 mL Oral BID PC  . heparin  5,000 Units Subcutaneous BID  . insulin aspart  0-9 Units Subcutaneous TID WC  . insulin glargine  13 Units Subcutaneous Q breakfast  . insulin glargine  35 Units Subcutaneous QHS  . isosorbide mononitrate  30 mg Oral Daily  . levofloxacin  500 mg Oral  Q48H  . levothyroxine  50 mcg Oral QAC breakfast  . lisinopril  10 mg Oral Daily  . montelukast  10 mg Oral QHS  . OxyCODONE  10 mg Oral Q12H  . pantoprazole  40 mg Oral Daily  . polyethylene glycol  17 g Oral Daily  . sodium chloride  3 mL Intravenous Q12H  . sotalol  120 mg Oral QHS    Continuous Infusions:   Ian Malkin RD, LDN Inpatient Clinical Dietitian Pager: [phone] After Hours Pager: [phone]

## 2013-06-10 NOTE — Progress Notes (Signed)
Patient continues to decline use of CPAP, verbalizes understanding to call if this decision changes.

## 2013-06-10 NOTE — Progress Notes (Signed)
Patient's heart rate continues to run SB in the mid 50's but heart rate has dropped to 34 tonight sustains for a few minutes then comes back up to 40's- 50's.  Patient asymptomatic and wide awake when heart rate goes to the 30's.  Only complaint is not being able to sleep tonight.  Patient may benefit from an antianxiety med to help her rest at night.  Will continue to closely monitor patient.

## 2013-06-10 NOTE — Evaluation (Signed)
Physical Therapy Evaluation Patient Details Name: [name] MRN: [id number] DOB: 10-31-1933 Today's Date: 06/10/2013 Time: 1355-1411 PT Time Calculation (min): 16 min  PT Assessment / Plan / Recommendation History of Present Illness  [name] is a 79 y.o. female with PMH of COPD/chronic respiratory failure on home o2, CHF, PAF, CAD s/p PTCA/stent, IDDM, HTN, HPL, OSA on CPAP presented with SOB, productive cough, green/yellow sputum for 4-5 days, associated with fever, chills; denies exertional symptoms, no chest pain, no nausea, vomiting or diarrhea; no focal neuro symptoms;.  Clinical Impression  Pt admitted with PNA. Pt currently presenting with functional limitation due to deficits listed below (see PT Problem List). Pt would benefit from skilled PT to improve independence and safety during mobility to allow d/c to venue below. Pt able to ambulate 53' with RW and min guard today, but limited by fatigue. PT discussed HHPT recommendation with pt in order to improve endurance, strength, and safety; pt reports she has received HHPT before and does not think insurance will cover it.  Pt reports she is on 2L of O2 [place] at home but has been on 3L of O2 [place] since hospitalization with no c/o SOB/dizziness.    PT Assessment  Patient needs continued PT services    Follow Up Recommendations  Home health PT;Supervision for mobility/OOB    Does the patient have the potential to tolerate intense rehabilitation      Barriers to Discharge        Equipment Recommendations  None recommended by PT    Recommendations for Other Services     Frequency Min 3X/week    Precautions / Restrictions Precautions Precautions: Fall Precaution Comments: O2 dependent Restrictions Weight Bearing Restrictions: No   Pertinent Vitals/Pain Pt reports 6/10 R hip/LE pain at rest but wanted to ambulate and she reports receiving pain meds prior to PT session. Pt reports no increase in pain during  amb. Pt positioned to comfort at end of session.      Mobility  Bed Mobility Bed Mobility: Supine to Sit;Sitting - Scoot to [place] of Bed;Sit to Supine Supine to Sit: 5: Supervision Sitting - Scoot to Edge of Bed: 5: Supervision Sit to Supine: 4: Min assist Details for Bed Mobility Assistance: supervision for safety, but pt required min A during sit to supine to assist RLE onto bed due to RLE pain. pt reports 6/10 RLE pain at rest but wished to ambulate. Transfers Transfers: Sit to Stand;Stand to Sit Sit to Stand: 4: Min guard;From bed;From chair/3-in-1;With upper extremity assist;With armrests Stand to Sit: 4: Min guard;To bed;To chair/3-in-1;With upper extremity assist;With armrests Details for Transfer Assistance: min guard to ensure safety and VC's for hand placement. Ambulation/Gait Ambulation/Gait Assistance: 4: Min guard Ambulation Distance (Feet): 80 Feet Assistive device: Rolling walker Ambulation/Gait Assistance Details: min guard to ensure safety and VC's to remain close to the RW. Pt educated pt on the importance of using walker or rollator at all times to improve safety and decrease risk for falls. Gait Pattern: Step-through pattern;Antalgic;Trunk flexed;Decreased stride length;Decreased stance time - right;Wide base of support Gait velocity: decreased    Exercises     PT Diagnosis: Difficulty walking;Generalized weakness;Other (comment) (chronic R hip/LE pain (history of sciatica))  PT Problem List: Decreased strength;Decreased activity tolerance;Decreased balance;Decreased mobility;Decreased knowledge of use of DME;Pain PT Treatment Interventions: DME instruction;Gait training;Stair training;Functional mobility training;Therapeutic activities;Therapeutic exercise;Balance training;Neuromuscular re-education;Patient/family education     PT Goals(Current goals can be found in the care plan section) Acute Rehab PT  Goals Patient Stated Goal: to go home PT Goal Formulation:  With patient Time For Goal Achievement: 06/24/13 Potential to Achieve Goals: Good  Visit Information  Last PT Received On: 06/10/13 Assistance Needed: +1 History of Present Illness: [name] is a 79 y.o. female with PMH of COPD/chronic respiratory failure on home o2, CHF, PAF, CAD s/p PTCA/stent, IDDM, HTN, HPL, OSA on CPAP presented with SOB, productive cough, green/yellow sputum for 4-5 days, associated with fever, chills; denies exertional symptoms, no chest pain, no nausea, vomiting or diarrhea; no focal neuro symptoms;.       Prior Functioning  Home Living Family/patient expects to be discharged to:: Private residence Living Arrangements: Spouse/significant other;Other relatives (sister-in-law) Available Help at Discharge: Family;Available 24 hours/day Type of Home: House Home Access: Stairs to enter [employer] of Steps: 1 Entrance Stairs-Rails: None Home Layout: Multi-level Alternate Level Stairs-Number of Steps: chair lift to second floor bedroom. family stays on 3rd level. Home Equipment: Walker - 4 wheels;Walker - standard;Tub bench;Cane - single point;Wheelchair - manual;Other (comment) (elevated toilet seats) Additional Comments: pt reports using standard walker and "grabs furniture" inside the house, she uses chair lift to access bedroom upstairs. pt uses rollator when ambulating in the community. Prior Function Level of Independence: Independent with assistive device(s);Needs assistance ADL's / Homemaking Assistance Needed: husband assists wtih socks Comments: pt reports ind. with ambulation with AD but requires husband to donn/doff socks due to R hip pain (sciatica).  Communication Communication: No difficulties    Cognition  Cognition Arousal/Alertness: Awake/alert Behavior During Therapy: WFL for tasks assessed/performed Overall Cognitive Status: Within Functional Limits for tasks assessed    Extremity/Trunk Assessment Upper Extremity  Assessment Upper Extremity Assessment: Overall WFL for tasks assessed Lower Extremity Assessment Lower Extremity Assessment: RLE deficits/detail;LLE deficits/detail RLE Deficits / Details: pt reports R hip/LE pain due to history of sciatica. pt able to move LE against gravity but unable to test strength due to 6/10 R hip pain. LLE Deficits / Details: pt able to move LE against gravity but unable to test strength due to 6/10 R hip pain, as pt states that moving LLE increases R LE pain.   Balance Balance Balance Assessed: Yes Dynamic Standing Balance Dynamic Standing - Level of Assistance: 4: Min assist;5: Stand by assistance  End of Session PT - End of Session Equipment Utilized During Treatment: Gait belt Activity Tolerance: Patient limited by pain;Patient limited by fatigue Patient left: in bed;with call bell/phone within reach;with bed alarm set;with family/visitor present  GP     Sol Blazing 06/10/2013, 3:00 PM

## 2013-06-10 NOTE — Progress Notes (Signed)
[facility] PROGRESS NOTE  [name] ZOX:[id number] DOB: 23-Nov-1933 DOA: 06/07/2013 PCP: Gweneth Dimitri, MD  Assessment/Plan: SOB -Likely 2/2 CAP and acute diastolic CHF. -See #s2, 3 below for details. -follow and wean oxygen as tolerated since she is not oxygen dependant at baseline.  CAP -Continue levaquin for now. -Cx data remains negative to date. -improving clinically  Acute Diastolic CHF -Last ECHO from 2009 with EF of 65%.  -Echo 10/27 with EF 65-70%, and grade 2 diastolic dysfunction -diuresed well overnight>>2900cc ou -lasix held per cards as Cr continuing to trend up since admission( though pt report her last cr that was checked oupt was 1.89) -Continue to monitor  renal function closely -KCl supplementation was dc'ed 10/28 as K was 4.9 and she is also on an ACE-I. K today 10/29 remains stable at 4.9  Atrial Fibrillation -cr continuing to trend up today so sotalol was changed to once daily per cards -continue cardizem, rate controlled -also pt with significant fall history, so coumadin dc'ed today per cards and pt now changed back to plavix abd ASA continued  IDDM -Fair control. -Continue current regimen.  Hypothyroidism -TSH ok. -Continue synthroid.  CKD Stage III -Cr trending up as above and lasix held and sotalol dose decreased>> follow closely as above -Recheck BMET in am.  Code Status: Full Code Family Communication: directly withPatient   Disposition Plan: Anticipate home when medically stable >> await PT/OT evals.   Consultants:  Cardiology   Antibiotics:  Levaquin 06/07/2013-->   Subjective: states cough better compared to yesterday, breathing about the same. Denies CP. Reports falling 10x in the past year and also just fell recently.  Objective: Filed Vitals:   06/10/13 0914 06/10/13 1011 06/10/13 1115 06/10/13 1120  BP:  139/45    Pulse:   54 62  Temp:      TempSrc:      Resp:      Height:      Weight:      SpO2:  94%  95% 96%    Intake/Output Summary (Last 24 hours) at 06/10/13 1224 Last data filed at 06/10/13 1100  Gross per 24 hour  Intake    540 ml  Output   3445 ml  Net  -2905 ml   Filed Weights   06/08/13 0500 06/09/13 0232 06/10/13 0617  Weight: 83.3 kg (183 lb 10.3 oz) 83.961 kg (185 lb 1.6 oz) 82.3 kg (181 lb 7 oz)    Exam:   General:  AA Ox3  Cardiovascular: irregular, rate controlled  Respiratory: No wheezing, mild bibasilar crackles  Abdomen: obese/S/NT/ND/+BS  Extremities: no edema, no cyanosis  Neurologic:  Grossly intact and non-focal.  Data Reviewed: Basic Metabolic Panel:  Recent Labs Lab 06/07/13 1300 06/08/13 0535 06/09/13 0440 06/10/13 0526  NA 133* 137 132* 133*  K 2.8* 3.9 4.9 4.9  CL 96 101 97 95*  CO2 23 25 23 27   GLUCOSE 175* 146* 187* 181*  BUN 33* 29* 34* 42*  CREATININE 1.48* 1.47* 1.66* 1.88*  CALCIUM 9.4 9.1 9.4 9.3   Liver Function Tests: No results found for this basename: AST, ALT, ALKPHOS, BILITOT, PROT, ALBUMIN,  in the last 168 hours No results found for this basename: LIPASE, AMYLASE,  in the last 168 hours No results found for this basename: AMMONIA,  in the last 168 hours CBC:  Recent Labs Lab 06/07/13 1300 06/08/13 0535 06/09/13 0440 06/10/13 0526  WBC 11.2* 9.8 9.1 6.4  NEUTROABS 9.6*  --   --   --  HGB 10.4* 9.0* 9.4* 9.6*  HCT 31.1* 27.5* 28.3* 29.5*  MCV 86.9 87.3 87.1 87.0  PLT 242 207 252 302   Cardiac Enzymes:  Recent Labs Lab 06/07/13 1830 06/07/13 2220 06/08/13 0535  TROPONINI <0.30 <0.30 <0.30   BNP (last 3 results)  Recent Labs  06/07/13 1300  PROBNP 3554.0*   CBG:  Recent Labs Lab 06/09/13 1144 06/09/13 1639 06/09/13 2035 06/10/13 0723 06/10/13 1138  GLUCAP 151* 139* 133* 134* 175*    Recent Results (from the past 240 hour(s))  CULTURE, BLOOD (ROUTINE X 2)     Status: None   Collection Time    06/07/13  1:00 PM      Result Value Range Status   Specimen Description BLOOD RIGHT  ARM  5 ML IN [facility] BOTTLE   Final   Special Requests NONE   Final   Culture  Setup Time     Final   Value: 06/07/2013 17:46     Performed at [facility]   Culture     Final   Value:        BLOOD CULTURE RECEIVED NO GROWTH TO DATE CULTURE WILL BE HELD FOR 5 DAYS BEFORE ISSUING A FINAL NEGATIVE REPORT     Performed at [facility]   Report Status PENDING   Incomplete  CULTURE, BLOOD (ROUTINE X 2)     Status: None   Collection Time    06/07/13  1:45 PM      Result Value Range Status   Specimen Description BLOOD LEFT ARM  8 ML IN [facility] BOTTLE   Final   Special Requests NONE   Final   Culture  Setup Time     Final   Value: 06/07/2013 17:45     Performed at [facility]   Culture     Final   Value:        BLOOD CULTURE RECEIVED NO GROWTH TO DATE CULTURE WILL BE HELD FOR 5 DAYS BEFORE ISSUING A FINAL NEGATIVE REPORT     Performed at [facility]   Report Status PENDING   Incomplete     Studies: No results found.  Scheduled Meds: . amLODipine  5 mg Oral Daily  . aspirin EC  81 mg Oral Daily  . budesonide-formoterol  2 puff Inhalation BID  . diltiazem  120 mg Oral Q12H  . docusate sodium  100 mg Oral BID  . feeding supplement (GLUCERNA SHAKE)  237 mL Oral BID PC  . furosemide  80 mg Intravenous Q12H  . heparin  5,000 Units Subcutaneous BID  . insulin aspart  0-9 Units Subcutaneous TID WC  . insulin glargine  13 Units Subcutaneous Q breakfast  . insulin glargine  35 Units Subcutaneous QHS  . isosorbide mononitrate  30 mg Oral Daily  . levofloxacin  500 mg Oral Q48H  . levothyroxine  50 mcg Oral QAC breakfast  . lisinopril  10 mg Oral Daily  . montelukast  10 mg Oral QHS  . OxyCODONE  10 mg Oral Q12H  . pantoprazole  40 mg Oral Daily  . polyethylene glycol  17 g Oral Daily  . sodium chloride  3 mL Intravenous Q12H  . sotalol  120 mg Oral Q12H  . warfarin  7.5 mg Oral ONCE-1800  . Warfarin - Pharmacist Dosing Inpatient   Does not apply q1800    Continuous Infusions:   Principal Problem:   CAP (community acquired pneumonia) Active Problems:   DM  OBSTRUCTIVE SLEEP APNEA   Atrial fibrillation   COPD with acute exacerbation   CHF (congestive heart failure), NYHA class II   Secondary pulmonary hypertension   Hyposmolality and/or hyponatremia   Acute on chronic diastolic heart failure    Time spent: 35 minutes    Jakorey Mcconathy C  [facility] Pager [phone]  If 7PM-7AM, please contact night-coverage at [email or web address], password [email or web address] 06/10/2013, 12:24 PM  LOS: 3 days

## 2013-06-11 DIAGNOSIS — J449 Chronic obstructive pulmonary disease, unspecified: Secondary | ICD-10-CM

## 2013-06-11 LAB — GLUCOSE, CAPILLARY
Glucose-Capillary: 194 mg/dL — ABNORMAL HIGH (ref 70–99)
Glucose-Capillary: 173 mg/dL — ABNORMAL HIGH (ref 70–99)
Glucose-Capillary: 206 mg/dL — ABNORMAL HIGH (ref 70–99)
Glucose-Capillary: 128 mg/dL — ABNORMAL HIGH (ref 70–99)

## 2013-06-11 LAB — BASIC METABOLIC PANEL
BUN: 40 mg/dL — ABNORMAL HIGH (ref 6–23)
CO2: 24 mEq/L (ref 19–32)
Chloride: 93 mEq/L — ABNORMAL LOW (ref 96–112)
Creatinine, Ser: 1.71 mg/dL — ABNORMAL HIGH (ref 0.50–1.10)
Glucose, Bld: 213 mg/dL — ABNORMAL HIGH (ref 70–99)
Sodium: 132 mEq/L — ABNORMAL LOW (ref 135–145)

## 2013-06-11 MED ORDER — RAMELTEON 8 MG PO TABS
8.0000 mg | ORAL_TABLET | Freq: Every day | ORAL | Status: DC
  Administered 2013-06-11 – 2013-06-15 (×5): 8 mg via ORAL
  Filled 2013-06-11 (×6): qty 1

## 2013-06-11 MED ORDER — RAMELTEON 8 MG PO TABS
8.0000 mg | ORAL_TABLET | Freq: Once | ORAL | Status: AC | PRN
  Administered 2013-06-11: 01:00:00 8 mg via ORAL
  Filled 2013-06-11: qty 1

## 2013-06-11 NOTE — Progress Notes (Signed)
       Patient Name: [name] Date of Encounter: 06/11/2013    SUBJECTIVE:No palpitations.  TELEMETRY:  NSR Filed Vitals:   06/11/13 0542 06/11/13 0608 06/11/13 1045 06/11/13 1115  BP: 143/48     Pulse: 51     Temp: 97.9 F (36.6 C)     TempSrc: Oral     Resp: 20     Height:      Weight:  179 lb 14.3 oz (81.6 kg)    SpO2: 95%  96% 95%    Intake/Output Summary (Last 24 hours) at 06/11/13 1351 Last data filed at 06/11/13 0916  Gross per 24 hour  Intake    120 ml  Output    700 ml  Net   -580 ml    LABS: Basic Metabolic Panel:  Recent Labs  78/29/56 0526 06/11/13 0500  NA 133* 132*  K 4.9 5.1  CL 95* 93*  CO2 27 24  GLUCOSE 181* 213*  BUN 42* 40*  CREATININE 1.88* 1.71*  CALCIUM 9.3 9.9   CBC:  Recent Labs  06/09/13 0440 06/10/13 0526  WBC 9.1 6.4  HGB 9.4* 9.6*  HCT 28.3* 29.5*  MCV 87.1 87.0  PLT 252 302     Radiology/Studies:  No new.  Physical Exam: Blood pressure 143/48, pulse 51, temperature 97.9 F (36.6 C), temperature source Oral, resp. rate 20, height 4' 11.84" (1.52 m), weight 179 lb 14.3 oz (81.6 kg), SpO2 95.00%. Weight change: -1 lb 8.7 oz (-0.7 kg)   Not examined  ASSESSMENT:  1. PAF on sotalol for rhythm control. Reduced dose due to decreased renal clearance.  2. Decision against anticoagulation due to falls and frailty.  Plan:  1. Continue current therapy  Signed, Lesleigh Noe 06/11/2013, 1:51 PM

## 2013-06-11 NOTE — Progress Notes (Signed)
Spoke with pt concerning Home Health RN, PT, pt states that she will not need HHRN or HHPT at present time.

## 2013-06-11 NOTE — Progress Notes (Signed)
Patient continued to complain of not being able to sleep.  Notified provider on call and order received to given a one time dose of Rozerem 8mg . Medications appears to be effective.  Will continue to monitor patient.

## 2013-06-11 NOTE — Progress Notes (Addendum)
[facility] PROGRESS NOTE  [name] ZOX:[id number] DOB: [birth year] DOA: 06/07/2013 PCP: Gweneth Dimitri, MD  Assessment/Plan: SOB -Likely 2/2 CAP and acute diastolic CHF. -See #s2, 3 below for details. -Continue to follow and wean oxygen as tolerated since she is not oxygen dependant at baseline.  CAP -Continue levaquin for now. -Cx data remains negative to date. -Will add mucolytic and follow.  Acute Diastolic CHF -Last ECHO from 2009 with EF of 65%.  -Echo 10/27 with EF 65-70%, and grade 2 diastolic dysfunction -diuresed well overnight>>2900cc ou -lasix held per cards as Cr continuing to trend up since admission( though pt report her last cr that was checked oupt was 1.89) -Continue to monitor  renal function closely -KCl supplementation was dc'ed 10/28 as K was 4.9 and she is also on an ACE-I. K  remains stable at 4.9  Atrial Fibrillation -cr continuing to trend up today so sotalol was changed to once daily per cards on 10/29, will continue current dose -continue cardizem, rate controlled -also pt with significant fall history, so coumadin dc'ed 10/29 per cards and pt now changed back to plavix abd ASA continued -Cardiology following, appreciate the assistance.  IDDM -Fair control. -Continue current regimen.  Hypothyroidism -TSH ok. -Continue synthroid.  CKD Stage III -Cr trending up as above and lasix held and sotalol dose decreased>> follow closely as above -Recheck BMET in am.  Code Status: Full Code Family Communication: directly withPatient   Disposition Plan: Anticipate home when medically stable >> await PT/OT evals.   Consultants:  Cardiology   Antibiotics:  Levaquin 06/07/2013-->   Subjective: states  breathing better but not back to her baseline. Cough about the same. Denies CP.  Objective: Filed Vitals:   06/11/13 0608 06/11/13 1045 06/11/13 1115 06/11/13 1410  BP:    137/60  Pulse:    53  Temp:    97.8 F (36.6 C)   TempSrc:    Oral  Resp:    20  Height:      Weight: 81.6 kg (179 lb 14.3 oz)     SpO2:  96% 95% 93%    Intake/Output Summary (Last 24 hours) at 06/11/13 1738 Last data filed at 06/11/13 1411  Gross per 24 hour  Intake    120 ml  Output   1000 ml  Net   -880 ml   Filed Weights   06/09/13 0232 06/10/13 0617 06/11/13 0608  Weight: 83.961 kg (185 lb 1.6 oz) 82.3 kg (181 lb 7 oz) 81.6 kg (179 lb 14.3 oz)    Exam:   General:  AA Ox3  Cardiovascular: irregular, rate controlled  Respiratory: No wheezing, mild bibasilar crackles  Abdomen: obese/S/NT/ND/+BS  Extremities: no edema, no cyanosis  Neurologic:  Grossly intact and non-focal.  Data Reviewed: Basic Metabolic Panel:  Recent Labs Lab 06/07/13 1300 06/08/13 0535 06/09/13 0440 06/10/13 0526 06/11/13 0500  NA 133* 137 132* 133* 132*  K 2.8* 3.9 4.9 4.9 5.1  CL 96 101 97 95* 93*  CO2 23 25 23 27 24   GLUCOSE 175* 146* 187* 181* 213*  BUN 33* 29* 34* 42* 40*  CREATININE 1.48* 1.47* 1.66* 1.88* 1.71*  CALCIUM 9.4 9.1 9.4 9.3 9.9   Liver Function Tests: No results found for this basename: AST, ALT, ALKPHOS, BILITOT, PROT, ALBUMIN,  in the last 168 hours No results found for this basename: LIPASE, AMYLASE,  in the last 168 hours No results found for this basename: AMMONIA,  in the last 168 hours CBC:  Recent  Labs Lab 06/07/13 1300 06/08/13 0535 06/09/13 0440 06/10/13 0526  WBC 11.2* 9.8 9.1 6.4  NEUTROABS 9.6*  --   --   --   HGB 10.4* 9.0* 9.4* 9.6*  HCT 31.1* 27.5* 28.3* 29.5*  MCV 86.9 87.3 87.1 87.0  PLT 242 207 252 302   Cardiac Enzymes:  Recent Labs Lab 06/07/13 1830 06/07/13 2220 06/08/13 0535  TROPONINI <0.30 <0.30 <0.30   BNP (last 3 results)  Recent Labs  06/07/13 1300  PROBNP 3554.0*   CBG:  Recent Labs Lab 06/10/13 1649 06/10/13 2149 06/11/13 0738 06/11/13 1202 06/11/13 1708  GLUCAP 110* 194* 173* 206* 128*    Recent Results (from the past 240 hour(s))  CULTURE,  BLOOD (ROUTINE X 2)     Status: None   Collection Time    06/07/13  1:00 PM      Result Value Range Status   Specimen Description BLOOD RIGHT ARM  5 ML IN [facility] BOTTLE   Final   Special Requests NONE   Final   Culture  Setup Time     Final   Value: 06/07/2013 17:46     Performed at [facility]   Culture     Final   Value:        BLOOD CULTURE RECEIVED NO GROWTH TO DATE CULTURE WILL BE HELD FOR 5 DAYS BEFORE ISSUING A FINAL NEGATIVE REPORT     Performed at [facility]   Report Status PENDING   Incomplete  CULTURE, BLOOD (ROUTINE X 2)     Status: None   Collection Time    06/07/13  1:45 PM      Result Value Range Status   Specimen Description BLOOD LEFT ARM  8 ML IN [facility] BOTTLE   Final   Special Requests NONE   Final   Culture  Setup Time     Final   Value: 06/07/2013 17:45     Performed at [facility]   Culture     Final   Value:        BLOOD CULTURE RECEIVED NO GROWTH TO DATE CULTURE WILL BE HELD FOR 5 DAYS BEFORE ISSUING A FINAL NEGATIVE REPORT     Performed at [facility]   Report Status PENDING   Incomplete     Studies: No results found.  Scheduled Meds: . amLODipine  5 mg Oral Daily  . aspirin EC  81 mg Oral Daily  . budesonide-formoterol  2 puff Inhalation BID  . clopidogrel  75 mg Oral Q breakfast  . diltiazem  120 mg Oral Q12H  . docusate sodium  100 mg Oral BID  . feeding supplement (GLUCERNA SHAKE)  237 mL Oral BID PC  . heparin  5,000 Units Subcutaneous BID  . insulin aspart  0-9 Units Subcutaneous TID WC  . insulin glargine  13 Units Subcutaneous Q breakfast  . insulin glargine  35 Units Subcutaneous QHS  . isosorbide mononitrate  30 mg Oral Daily  . levofloxacin  500 mg Oral Q48H  . levothyroxine  50 mcg Oral QAC breakfast  . lisinopril  10 mg Oral Daily  . montelukast  10 mg Oral QHS  . OxyCODONE  10 mg Oral Q12H  . pantoprazole  40 mg Oral Daily  . polyethylene glycol  17 g Oral Daily  . sodium chloride  3 mL  Intravenous Q12H  . sotalol  120 mg Oral QHS   Continuous Infusions:   Principal Problem:   CAP (community  acquired pneumonia) Active Problems:   DM   OBSTRUCTIVE SLEEP APNEA   Atrial fibrillation   COPD with acute exacerbation   CHF (congestive heart failure), NYHA class II   Secondary pulmonary hypertension   Hyposmolality and/or hyponatremia   Acute on chronic diastolic heart failure    Time spent: 25 minutes    Timoteo Carreiro C  [facility] Pager [phone]  If 7PM-7AM, please contact night-coverage at [email or web address], password [facility] 06/11/2013, 5:38 PM  LOS: 4 days

## 2013-06-11 NOTE — Progress Notes (Signed)
Inpatient Diabetes Program Recommendations  AACE/ADA: New Consensus Statement on Inpatient Glycemic Control (2013)  Target Ranges:  Prepandial:   less than 140 mg/dL      Peak postprandial:   less than 180 mg/dL (1-2 hours)      Critically ill patients:  140 - 180 mg/dL   Reason for Visit: Hyperglycemia Results for [name], [name] (MRN [id number]) as of 06/11/2013 14:18  Ref. Range 06/10/2013 21:49 06/11/2013 07:38 06/11/2013 12:02  Glucose-Capillary Latest Range: 70-99 mg/dL 147 (H) 829 (H) 562 (H)   CBGs elevated.  Inpatient Diabetes Program Recommendations Insulin - Basal: Increase am Lantus to 15 units QAM  Note: Will continue to follow. Thank you. Ailene Ards, RD, LDN, CDE Inpatient Diabetes Coordinator [phone]

## 2013-06-11 NOTE — Progress Notes (Signed)
Occupational Therapy Treatment Patient Details Name: [name] MRN: [id number] DOB: Mar 05, 1934 Today's Date: 06/11/2013 Time: 1191-4782 OT Time Calculation (min): 24 min  OT Assessment / Plan / Recommendation  History of present illness [name] is a 79 y.o. female with PMH of COPD/chronic respiratory failure on home o2, CHF, PAF, CAD s/p PTCA/stent, IDDM, HTN, HPL, OSA on CPAP presented with SOB, productive cough, green/yellow sputum for 4-5 days, associated with fever, chills; denies exertional symptoms, no chest pain, no nausea, vomiting or diarrhea; no focal neuro symptoms;.   OT comments  Pt states she is more fatigued today from not sleeping well but did agree up to the sink to perform grooming task. She states she has adequate help at home with sister in law and husband.   Follow Up Recommendations  Home health OT  (if pt agreeable. Pt stated she doesn't feel she needs HHOT)   Barriers to Discharge       Equipment Recommendations  None recommended by OT    Recommendations for Other Services    Frequency Min 2X/week   Progress towards OT Goals Progress towards OT goals: Progressing toward goals  Plan Discharge plan remains appropriate    Precautions / Restrictions Precautions Precautions: Fall Restrictions Weight Bearing Restrictions: No   Pertinent Vitals/Pain Pt states sore when she coughs.    ADL  Grooming: Performed;Teeth care;Min guard Where Assessed - Grooming: Unsupported standing Toilet Transfer: Simulated;Minimal assistance Toilet Transfer Method: Stand pivot ADL Comments: Pt states she feels more fatigued today from not sleeping well last night. Unable to locate a oxygen tank carrier and O2 extension tubing not available to mobilize pt into bathroom. O2 tubing requested to be sent to unit. Discussed purse lip breathing and energy conservation techniques including sitting down to shower and having also supervision for safety with  showering initially. Pt verbalized understanding. She declines need for HHOT.    OT Diagnosis:    OT Problem List:   OT Treatment Interventions:     OT Goals(current goals can now be found in the care plan section)    Visit Information  Last OT Received On: 06/11/13 Assistance Needed: +1 History of Present Illness: [name] is a 79 y.o. female with PMH of COPD/chronic respiratory failure on home o2, CHF, PAF, CAD s/p PTCA/stent, IDDM, HTN, HPL, OSA on CPAP presented with SOB, productive cough, green/yellow sputum for 4-5 days, associated with fever, chills; denies exertional symptoms, no chest pain, no nausea, vomiting or diarrhea; no focal neuro symptoms;.    Subjective Data      Prior Functioning       Cognition  Cognition Arousal/Alertness: Awake/alert Behavior During Therapy: WFL for tasks assessed/performed Overall Cognitive Status: Within Functional Limits for tasks assessed    Mobility  Transfers Transfers: Sit to Stand;Stand to Sit Sit to Stand: 4: Min guard;With upper extremity assist;From chair/3-in-1 Stand to Sit: 4: Min guard;With upper extremity assist;To chair/3-in-1 Details for Transfer Assistance: min guard for safety as pt fatigued     Exercises      Balance Balance Balance Assessed: Yes Dynamic Standing Balance Dynamic Standing - Level of Assistance: 4: Min assist (min guard)   End of Session OT - End of Session Activity Tolerance: Patient limited by fatigue Patient left: in chair;with call bell/phone within reach  GO     [name] [phone] 06/11/2013, 11:22 AM

## 2013-06-12 DIAGNOSIS — I509 Heart failure, unspecified: Secondary | ICD-10-CM

## 2013-06-12 LAB — BASIC METABOLIC PANEL
CO2: 26 mEq/L (ref 19–32)
Calcium: 9.7 mg/dL (ref 8.4–10.5)
Chloride: 94 mEq/L — ABNORMAL LOW (ref 96–112)
GFR calc non Af Amer: 30 mL/min — ABNORMAL LOW (ref >90)
Glucose, Bld: 180 mg/dL — ABNORMAL HIGH (ref 70–99)
Potassium: 4.6 mEq/L (ref 3.5–5.1)
Sodium: 130 mEq/L — ABNORMAL LOW (ref 135–145)

## 2013-06-12 LAB — GLUCOSE, CAPILLARY
Glucose-Capillary: 132 mg/dL — ABNORMAL HIGH (ref 70–99)
Glucose-Capillary: 113 mg/dL — ABNORMAL HIGH (ref 70–99)

## 2013-06-12 MED ORDER — LEVALBUTEROL HCL 0.63 MG/3ML IN NEBU: 0.6300 mg | INHALATION_SOLUTION | Freq: Four times a day (QID) | RESPIRATORY_TRACT | Status: DC | PRN

## 2013-06-12 MED ORDER — LEVALBUTEROL HCL 0.63 MG/3ML IN NEBU
0.6300 mg | INHALATION_SOLUTION | Freq: Four times a day (QID) | RESPIRATORY_TRACT | Status: AC
  Administered 2013-06-12 (×3): 0.63 mg via RESPIRATORY_TRACT
  Filled 2013-06-12 (×3): qty 3

## 2013-06-12 MED ORDER — DM-GUAIFENESIN ER 30-600 MG PO TB12
1.0000 | ORAL_TABLET | Freq: Two times a day (BID) | ORAL | Status: DC
  Administered 2013-06-12 – 2013-06-16 (×10): 1 via ORAL
  Filled 2013-06-12 (×11): qty 1

## 2013-06-12 MED ORDER — BENZONATATE 100 MG PO CAPS
100.0000 mg | ORAL_CAPSULE | Freq: Three times a day (TID) | ORAL | Status: DC
  Administered 2013-06-12 – 2013-06-16 (×11): 100 mg via ORAL
  Filled 2013-06-12 (×13): qty 1

## 2013-06-12 NOTE — Progress Notes (Signed)
Physical Therapy Treatment Patient Details Name: [name] MRN: [id number] DOB: 1934/02/21 Today's Date: 06/12/2013 Time: 4098-1191 PT Time Calculation (min): 20 min  PT Assessment / Plan / Recommendation  History of Present Illness [name] is a 79 y.o. female with PMH of COPD/chronic respiratory failure on home o2, CHF, PAF, CAD s/p PTCA/stent, IDDM, HTN, HPL, OSA on CPAP presented with SOB, productive cough, green/yellow sputum for 4-5 days, associated with fever, chills; denies exertional symptoms, no chest pain, no nausea, vomiting or diarrhea; no focal neuro symptoms;.   PT Comments   Not progressing with session. Pt c/o painful feet, difficulty ambulating due to this. Pt required Mod assist for mobility this session. Mod encouragement for pt to participate this session. Encouraged pt to get OOB, attempt to ambulate over the weekend with nursing assistance. May require increased assistance at home.   Follow Up Recommendations  Home health PT;Supervision for mobility/OOB     Does the patient have the potential to tolerate intense rehabilitation     Barriers to Discharge        Equipment Recommendations  None recommended by PT    Recommendations for Other Services    Frequency Min 3X/week   Progress towards PT Goals Progress towards PT goals: Not progressing toward goals - comment (due to painful feet)  Plan Current plan remains appropriate    Precautions / Restrictions Precautions Precautions: Fall Precaution Comments: O2 dependent Restrictions Weight Bearing Restrictions: No   Pertinent Vitals/Pain 7/10 bil feet     Mobility  Bed Mobility Bed Mobility: Supine to Sit;Sit to Supine Supine to Sit: 4: Min guard Sit to Supine: 4: Min guard Transfers Transfers: Sit to Stand;Stand to Sit Sit to Stand: 3: Mod assist;From bed Stand to Sit: 4: Min assist;To bed Details for Transfer Assistance: Assist to rise, stabilize, control descent. VCs safety,  technique, hand placement. Difficulty rising due to painful bil feet-pt states it's arthritis  Ambulation/Gait Ambulation/Gait Assistance: 4: Min assist Ambulation Distance (Feet): 10 Feet Assistive device: Rolling walker Ambulation/Gait Assistance Details: Assist to stabilize and maneuver with RW. Gait difficult due to pain in bil feet. Pt declined to ambulate any farther.  Gait Pattern: Step-to pattern;Decreased stride length;Antalgic;Decreased step length - left;Decreased step length - right    Exercises     PT Diagnosis:    PT Problem List:   PT Treatment Interventions:     PT Goals (current goals can now be found in the care plan section)    Visit Information  Last PT Received On: 06/12/13 Assistance Needed: +1 History of Present Illness: [name] is a 79 y.o. female with PMH of COPD/chronic respiratory failure on home o2, CHF, PAF, CAD s/p PTCA/stent, IDDM, HTN, HPL, OSA on CPAP presented with SOB, productive cough, green/yellow sputum for 4-5 days, associated with fever, chills; denies exertional symptoms, no chest pain, no nausea, vomiting or diarrhea; no focal neuro symptoms;.    Subjective Data      Cognition  Cognition Arousal/Alertness: Awake/alert Behavior During Therapy: WFL for tasks assessed/performed Overall Cognitive Status: Within Functional Limits for tasks assessed    Balance     End of Session PT - End of Session Activity Tolerance: Patient limited by pain Patient left: in bed;with call bell/phone within reach;with family/visitor present Nurse Communication: Mobility status   GP     Rebeca Alert, MPT Pager: [phone]

## 2013-06-12 NOTE — Progress Notes (Signed)
REport from Merla Riches, RN. Pt resting in bed, husband bedside. Pt denies pain/SOB at present. SCDs in place, callbell in reach. No c/o at the present time.

## 2013-06-12 NOTE — Progress Notes (Signed)
SUBJECTIVE:  Doing well.  No complaints  OBJECTIVE:   Vitals:   Filed Vitals:   06/11/13 2059 06/11/13 2256 06/12/13 0600 06/12/13 0940  BP: 143/47 178/46 134/62   Pulse: 56  61   Temp: 97.9 F (36.6 C)  98.1 F (36.7 C)   TempSrc: Oral  Oral   Resp: 22  19   Height:      Weight:   180 lb 11.2 oz (81.965 kg)   SpO2: 96%  98% 98%   I&O's:   Intake/Output Summary (Last 24 hours) at 06/12/13 1122 Last data filed at 06/12/13 0900  Gross per 24 hour  Intake    480 ml  Output   1550 ml  Net  -1070 ml   TELEMETRY: Reviewed telemetry pt in NSR:     PHYSICAL EXAM General: Well developed, well nourished, in no acute distress Head: Eyes PERRLA, No xanthomas.   Normal cephalic and atramatic  Lungs:   Clear bilaterally to auscultation and percussion. Heart:   HRRR S1 S2 Pulses are 2+ & equal. Abdomen: Bowel sounds are positive, abdomen soft and non-tender without masses Extremities:   No clubbing, cyanosis or edema.  DP +1  LABS: Basic Metabolic Panel:  Recent Labs  16/10/96 0526 06/11/13 0500  NA 133* 132*  K 4.9 5.1  CL 95* 93*  CO2 27 24  GLUCOSE 181* 213*  BUN 42* 40*  CREATININE 1.88* 1.71*  CALCIUM 9.3 9.9   Liver Function Tests: No results found for this basename: AST, ALT, ALKPHOS, BILITOT, PROT, ALBUMIN,  in the last 72 hours No results found for this basename: LIPASE, AMYLASE,  in the last 72 hours CBC:  Recent Labs  06/10/13 0526  WBC 6.4  HGB 9.6*  HCT 29.5*  MCV 87.0  PLT 302   Cardiac Enzymes: No results found for this basename: CKTOTAL, CKMB, CKMBINDEX, TROPONINI,  in the last 72 hours BNP: No components found with this basename: POCBNP,  D-Dimer: No results found for this basename: DDIMER,  in the last 72 hours Hemoglobin A1C: No results found for this basename: HGBA1C,  in the last 72 hours Fasting Lipid Panel: No results found for this basename: CHOL, HDL, LDLCALC, TRIG, CHOLHDL, LDLDIRECT,  in the last 72 hours Thyroid Function  Tests: No results found for this basename: TSH, T4TOTAL, FREET3, T3FREE, THYROIDAB,  in the last 72 hours Anemia Panel: No results found for this basename: VITAMINB12, FOLATE, FERRITIN, TIBC, IRON, RETICCTPCT,  in the last 72 hours Coag Panel:   Lab Results  Component Value Date   INR 1.19 06/10/2013   INR 1.26 06/09/2013   INR 1.39 06/08/2013    RADIOLOGY: Dg Chest 2 View  06/07/2013   CLINICAL DATA:  Shortness of breath and chest pain  EXAM: CHEST  2 VIEW  COMPARISON:  01/2013  FINDINGS: Mild cardiac enlargement. Mild to moderate vascular congestion with no pulmonary edema. No consolidation or infiltrate. Minimal pleural effusions.  IMPRESSION: Findings suggest an element of fluid overload with vascular congestion but no current pulmonary edema.   Electronically Signed   By: Esperanza Heir M.D.   On: 06/07/2013 13:41   Ct Head Wo Contrast  06/07/2013   CLINICAL DATA:  Fall days ago, takes blood thinners  EXAM: CT HEAD WITHOUT CONTRAST  TECHNIQUE: Contiguous axial images were obtained from the base of the skull through the vertex without intravenous contrast.  COMPARISON:  12/31/2009  FINDINGS: Diffuse age-related atrophy. Diffuse bilateral low attenuation in the deep white matter.  No evidence of hemorrhage or extra-axial fluid. No vascular territory infarct. Bilateral basal ganglia calcification. Calvarium is intact.  IMPRESSION: No acute abnormalities   Electronically Signed   By: Esperanza Heir M.D.   On: 06/07/2013 14:11   Ct Chest Wo Contrast  06/07/2013   CLINICAL DATA:  Shortness of breath. Cough and fever. History of tracheal bronchitis and COPD. History of lung nodule.  EXAM: CT CHEST WITHOUT CONTRAST  TECHNIQUE: Multidetector CT imaging of the chest was performed following the standard protocol without IV contrast.  COMPARISON:  Multiple exams, including 06/07/2013 and 06/02/2008  FINDINGS: Right paratracheal node short axis 1.3 cm, formerly 0.7 cm. A low right paratracheal lymph  node short axis 1.4 cm, formerly 0.8 cm. Probable hilar adenopathy. Other nodes have also increased in size significantly.  Dense atherosclerosis.  This includes the coronary arteries.  Consolidation in the right lower lobe with surrounding faint nodularity and marginal ground-glass opacity. Chronic lingular scarring. Faint ground-glass densities and volume loss in the left lower lobe.  Numerous chronic thoracic compression fractures are observed. Suspected emphysema. Old healed bilateral rib fractures anteriorly.  IMPRESSION: 1. Consolidation in the right lower lobe with new pathologic mediastinal adenopathy. Appearance favors bacterial pneumonia. Followup chest radiography is recommended in 4-6 weeks time to ensure resolution and exclude underlying malignancy. Alternatively CT would provide highly specific followup and allow direct assessment of the adenopathy for resolution. Patchy ground-glass opacities in the right lower lobe and to a lesser extent in the left lower lobe are also likely inflammatory. 2. Mild atelectasis in the left lower lobe. Stable lingular scarring. 3. Atherosclerosis.   Electronically Signed   By: Herbie Baltimore M.D.   On: 06/07/2013 18:42    ASSESSMENT:  1. PAF on sotalol for rhythm control. Reduced dose due to decreased renal clearance.  2. Decision against anticoagulation due to falls and frailty.  Plan:  1. Continue current therapy     Quintella Reichert, MD  06/12/2013  11:22 AM

## 2013-06-12 NOTE — Progress Notes (Signed)
[facility] PROGRESS NOTE  [name] WUJ:[id number] DOB: [birth year] DOA: 06/07/2013 PCP: Gweneth Dimitri, MD  Assessment/Plan: SOB -Likely 2/2 CAP and acute diastolic CHF. -See #s2, 3 below for details. -Now on 3 L nasal cannula-states she is usually on 2 L  at home continue to follow and wean oxygen as tolerated   CAP -Continue levaquin for now. -Cx data remains negative to date. -Improving on mucolytic and nebs, will add antitussives  Acute Diastolic CHF -Last ECHO from 2009 with EF of 65%.  -Echo 10/27 with EF 65-70%, and grade 2 diastolic dysfunction -diuresed well overnight>>2900cc ou -lasix held per cards 10/29 as Cr continuing to trend up since admission( though pt report her last cr that was checked oupt was 1.89) -Continue to monitor  renal function closely -KCl supplementation was dc'ed 10/28 as K was 4.9 and she is also on an ACE-I. K  remains stable at 4.6 - recheck BNP in a.m., follow and resume Lasix as appropriate creatinine improving -Cardiology following Atrial Fibrillation -cr continuing to trend up today so sotalol was changed to once daily per cards on 10/29, will continue current dose -continue cardizem, rate controlled -also pt with significant fall history, so coumadin dc'ed 10/29 per cards and pt now changed back to plavix abd ASA continued -Cardiology following, appreciate the assistance.  IDDM -Fair control. -Continue current regimen.  Hypothyroidism -TSH ok. -Continue synthroid.  CKD Stage III -Cr trending up as above and lasix held and sotalol dose decreased>> follow closely as above -He is in improved to 1.6 on today, follow Hyponatremia -Likely secondary to CHF that she's been off her Lasix, follow and resume his excess discussed above. Code Status: Full Code Family Communication: directly withPatient   Disposition Plan: Anticipate home when medically stable >> await PT/OT  evals.   Consultants:  Cardiology   Antibiotics:  Levaquin 06/07/2013-->   Subjective: Still coughing , but breathing better. Denies CP.  Objective: Filed Vitals:   06/11/13 2256 06/12/13 0600 06/12/13 0940 06/12/13 1500  BP: 178/46 134/62  140/72  Pulse:  61  70  Temp:  98.1 F (36.7 C)  97.4 F (36.3 C)  TempSrc:  Oral  Oral  Resp:  19  18  Height:      Weight:  81.965 kg (180 lb 11.2 oz)    SpO2:  98% 98% 98%    Intake/Output Summary (Last 24 hours) at 06/12/13 1837 Last data filed at 06/12/13 1619  Gross per 24 hour  Intake    480 ml  Output   1250 ml  Net   -770 ml   Filed Weights   06/10/13 0617 06/11/13 0608 06/12/13 0600  Weight: 82.3 kg (181 lb 7 oz) 81.6 kg (179 lb 14.3 oz) 81.965 kg (180 lb 11.2 oz)    Exam:   General:  AA Ox3  Cardiovascular: irregular, rate controlled  Respiratory: No wheezing, decreased breath sounds at the bases,  Abdomen: obese/S/NT/ND/+BS  Extremities: no edema, no cyanosis  Neurologic:  Grossly intact and non-focal.  Data Reviewed: Basic Metabolic Panel:  Recent Labs Lab 06/08/13 0535 06/09/13 0440 06/10/13 0526 06/11/13 0500 06/12/13 1213  NA 137 132* 133* 132* 130*  K 3.9 4.9 4.9 5.1 4.6  CL 101 97 95* 93* 94*  CO2 25 23 27 24 26   GLUCOSE 146* 187* 181* 213* 180*  BUN 29* 34* 42* 40* 42*  CREATININE 1.47* 1.66* 1.88* 1.71* 1.61*  CALCIUM 9.1 9.4 9.3 9.9 9.7   Liver Function Tests: No results  found for this basename: AST, ALT, ALKPHOS, BILITOT, PROT, ALBUMIN,  in the last 168 hours No results found for this basename: LIPASE, AMYLASE,  in the last 168 hours No results found for this basename: AMMONIA,  in the last 168 hours CBC:  Recent Labs Lab 06/07/13 1300 06/08/13 0535 06/09/13 0440 06/10/13 0526  WBC 11.2* 9.8 9.1 6.4  NEUTROABS 9.6*  --   --   --   HGB 10.4* 9.0* 9.4* 9.6*  HCT 31.1* 27.5* 28.3* 29.5*  MCV 86.9 87.3 87.1 87.0  PLT 242 207 252 302   Cardiac Enzymes:  Recent  Labs Lab 06/07/13 1830 06/07/13 2220 06/08/13 0535  TROPONINI <0.30 <0.30 <0.30   BNP (last 3 results)  Recent Labs  06/07/13 1300  PROBNP 3554.0*   CBG:  Recent Labs Lab 06/11/13 1736 06/11/13 2056 06/12/13 0736 06/12/13 1140 06/12/13 1702  GLUCAP 118* 217* 132* 172* 113*    Recent Results (from the past 240 hour(s))  CULTURE, BLOOD (ROUTINE X 2)     Status: None   Collection Time    06/07/13  1:00 PM      Result Value Range Status   Specimen Description BLOOD RIGHT ARM  5 ML IN [facility] BOTTLE   Final   Special Requests NONE   Final   Culture  Setup Time     Final   Value: 06/07/2013 17:46     Performed at [facility]   Culture     Final   Value:        BLOOD CULTURE RECEIVED NO GROWTH TO DATE CULTURE WILL BE HELD FOR 5 DAYS BEFORE ISSUING A FINAL NEGATIVE REPORT     Performed at [facility]   Report Status PENDING   Incomplete  CULTURE, BLOOD (ROUTINE X 2)     Status: None   Collection Time    06/07/13  1:45 PM      Result Value Range Status   Specimen Description BLOOD LEFT ARM  8 ML IN [facility] BOTTLE   Final   Special Requests NONE   Final   Culture  Setup Time     Final   Value: 06/07/2013 17:45     Performed at [facility]   Culture     Final   Value:        BLOOD CULTURE RECEIVED NO GROWTH TO DATE CULTURE WILL BE HELD FOR 5 DAYS BEFORE ISSUING A FINAL NEGATIVE REPORT     Performed at [facility]   Report Status PENDING   Incomplete     Studies: No results found.  Scheduled Meds: . amLODipine  5 mg Oral Daily  . aspirin EC  81 mg Oral Daily  . budesonide-formoterol  2 puff Inhalation BID  . clopidogrel  75 mg Oral Q breakfast  . dextromethorphan-guaiFENesin  1 tablet Oral BID  . diltiazem  120 mg Oral Q12H  . docusate sodium  100 mg Oral BID  . feeding supplement (GLUCERNA SHAKE)  237 mL Oral BID PC  . heparin  5,000 Units Subcutaneous BID  . insulin aspart  0-9 Units Subcutaneous TID WC  . insulin glargine  13  Units Subcutaneous Q breakfast  . insulin glargine  35 Units Subcutaneous QHS  . isosorbide mononitrate  30 mg Oral Daily  . levalbuterol  0.63 mg Nebulization Q6H  . levofloxacin  500 mg Oral Q48H  . levothyroxine  50 mcg Oral QAC breakfast  . lisinopril  10 mg Oral Daily  .  montelukast  10 mg Oral QHS  . OxyCODONE  10 mg Oral Q12H  . pantoprazole  40 mg Oral Daily  . polyethylene glycol  17 g Oral Daily  . ramelteon  8 mg Oral QHS  . sodium chloride  3 mL Intravenous Q12H  . sotalol  120 mg Oral QHS   Continuous Infusions:   Principal Problem:   CAP (community acquired pneumonia) Active Problems:   DM   OBSTRUCTIVE SLEEP APNEA   Atrial fibrillation   COPD with acute exacerbation   CHF (congestive heart failure), NYHA class II   Secondary pulmonary hypertension   Hyposmolality and/or hyponatremia   Acute on chronic diastolic heart failure    Time spent: 25 minutes    Jashae Wiggs C  [facility] Pager [phone]  If 7PM-7AM, please contact night-coverage at [email or web address], password [email or web address] 06/12/2013, 6:37 PM  LOS: 5 days

## 2013-06-12 NOTE — Progress Notes (Signed)
ANTIBIOTIC CONSULT NOTE   Pharmacy Consult for Levaquin Indication: possible PNA  Allergies  Allergen Reactions  . Cefuroxime Axetil   . Cephalexin   . Clarithromycin   . Doxycycline     REACTION: rash on legs and feet, edema  . Erythromycin   . Tetracycline     Patient Measurements: Height: 4' 11.84" (152 cm) Weight: 180 lb 11.2 oz (81.965 kg) IBW/kg (Calculated) : 45.14  Vital Signs: Temp: 98.1 F (36.7 C) (10/31 0600) Temp src: Oral (10/31 0600) BP: 134/62 mmHg (10/31 0600) Pulse Rate: 61 (10/31 0600) Intake/Output from previous day: 10/30 0701 - 10/31 0700 In: 360 [P.O.:360] Out: 1300 [Urine:1300] Intake/Output from this shift: Total I/O In: 240 [P.O.:240] Out: 350 [Urine:350]  Medical History: Past Medical History  Diagnosis Date  . CAD (coronary artery disease)   . CHF (congestive heart failure)   . Paroxysmal atrial fibrillation   . Bradycardia   . Hypertension   . Renal artery stenosis   . COPD (chronic obstructive pulmonary disease)   . Obesity hypoventilation syndrome   . Pruritus   . Tracheobronchitis   . Acute rhinosinusitis   . Anemia   . Hypoxemia   . Peripheral edema   . Obesity, endogenous   . Diabetes mellitus   . Acid reflux disease   . Osteoarthritis, generalized   . Hypothyroidism   . Lung nodule     lingula  . OSA (obstructive sleep apnea)     NPSG 04/03/00--AHI 28.hr  . DVT (deep venous thrombosis) 2003    left leg  . Vaginal atrophy   . Hx of colonic polyps   . Acute diastolic heart failure   . Secondary pulmonary hypertension 06/09/2013    Assessment: 79 [facility]F [facility] for r/o PNA given SOB, productive cough, fever. CXR with findings suggest of fluid overload with vascular congestion, no consolidation or infiltrate. MD order to start Levaquin for possible PNA.   10/26 >> Levaquin >>  Tmax: afeb WBCs: none today, prev 6.4 Renal: none today, Scr 1.71, CG 26  10/26 blood x 2: NGTD  Plan:  Day #6  levofloxacin  Continue Levaquin 500mg  PO q48h  Suggest D/C after tomorrow's dose to complete 8 days of therapy  Juliette Alcide, PharmD, BCPS.   Pager: [phone] 06/12/2013 12:04 PM

## 2013-06-13 LAB — CULTURE, BLOOD (ROUTINE X 2): Culture: NO GROWTH

## 2013-06-13 LAB — GLUCOSE, CAPILLARY
Glucose-Capillary: 148 mg/dL — ABNORMAL HIGH (ref 70–99)
Glucose-Capillary: 157 mg/dL — ABNORMAL HIGH (ref 70–99)

## 2013-06-13 LAB — PRO B NATRIURETIC PEPTIDE: Pro B Natriuretic peptide (BNP): 1503 pg/mL — ABNORMAL HIGH (ref 0–450)

## 2013-06-13 MED ORDER — FUROSEMIDE 40 MG PO TABS
40.0000 mg | ORAL_TABLET | Freq: Two times a day (BID) | ORAL | Status: DC
  Administered 2013-06-13 – 2013-06-16 (×6): 40 mg via ORAL
  Filled 2013-06-13 (×8): qty 1

## 2013-06-13 NOTE — Progress Notes (Signed)
Physical Therapy Treatment Patient Details Name: [name] MRN: [id number] DOB: 03-13-1934 Today's Date: 06/13/2013 Time: 4098-1191 PT Time Calculation (min): 29 min  PT Assessment / Plan / Recommendation  History of Present Illness [name] is a 79 y.o. female with PMH of COPD/chronic respiratory failure on home o2, CHF, PAF, CAD s/p PTCA/stent, IDDM, HTN, HPL, OSA on CPAP presented with SOB, productive cough, green/yellow sputum for 4-5 days, associated with fever, chills; denies exertional symptoms, no chest pain, no nausea, vomiting or diarrhea; no focal neuro symptoms;.   PT Comments   Pt required MAX encouragement to participate stating her feet hurt to bad to walk.  On exam L foot hurt more that right.  Pt did agree to get OOB to [facility] to void.  Noted decreased WB thru L LE and very unsteady transfer.  Attempted to amb pt however she flat out declined stating "the pain is too bad".  Pt took a few steps to recliner only.  Pt declines the thought of going to SNF.  Her spouse was at [facility] for 7 weeks. 'Im going home".   Follow Up Recommendations  Home health PT;Supervision for mobility/OOB (pt declines the thought of a SNF)     Does the patient have the potential to tolerate intense rehabilitation     Barriers to Discharge        Equipment Recommendations  None recommended by PT    Recommendations for Other Services    Frequency Min 3X/week   Progress towards PT Goals Progress towards PT goals: Progressing toward goals  Plan      Precautions / Restrictions Precautions Precautions: Fall (pt reports 10 falls in past year) Precaution Comments: O2 dependent Restrictions Weight Bearing Restrictions: No    Pertinent Vitals/Pain C/o L foot pain  meds requested RN notified    Mobility  Bed Mobility Bed Mobility: Supine to Sit;Sit to Supine Supine to Sit: 4: Min assist Details for Bed Mobility Assistance: assist with upper body and increased time as pt  was c/o stiffness and pain in her back Transfers Transfers: Sit to Stand;Stand to Sit Sit to Stand: 4: Min assist;From bed;From toilet Stand to Sit: 4: Min guard;To toilet;To chair/3-in-1 Details for Transfer Assistance: increased time due to c/o pain in her back and L foot.  "I have bad arthritis"  50% VC's on safety with turns and demon slight unsteadyness with incomplete turn and sitting before reaching chair. Ambulation/Gait Ambulation/Gait Assistance Details: Pt declined to amb despite several attempts.  "I can't with this pain". Pt c/o L foot pain and back pain stating she had arthritis "all over".     PT Goals (current goals can now be found in the care plan section)    Visit Information  Last PT Received On: 06/13/13 Assistance Needed: +1 History of Present Illness: [name] is a 79 y.o. female with PMH of COPD/chronic respiratory failure on home o2, CHF, PAF, CAD s/p PTCA/stent, IDDM, HTN, HPL, OSA on CPAP presented with SOB, productive cough, green/yellow sputum for 4-5 days, associated with fever, chills; denies exertional symptoms, no chest pain, no nausea, vomiting or diarrhea; no focal neuro symptoms;.    Subjective Data      Cognition       Balance     End of Session PT - End of Session Equipment Utilized During Treatment: Gait belt Activity Tolerance: Patient limited by pain Patient left: in chair;with call bell/phone within reach Nurse Communication: Mobility status   Felecia Shelling  PTA [facility]  Acute  Rehab Pager      [phone]

## 2013-06-13 NOTE — Progress Notes (Signed)
Pt states she does wear CPAP at home, "when well," however, pt does not want to wear CPAP tonight. RT will continue to monitor.

## 2013-06-13 NOTE — Progress Notes (Signed)
[facility] PROGRESS NOTE  [name] ZOX:[id number] DOB: Jan 31, 1934 DOA: 06/07/2013 PCP: Gweneth Dimitri, MD  Assessment/Plan: SOB -Likely 2/2 CAP and acute diastolic CHF. -See #s2, 3 below for details. -Now on 2-2 &1/2 nasal cannula-states she is usually on 2 L [facility] at home continue to follow and wean oxygen as tolerated   CAP -Continue levaquin for now to complete total of 10days of abx. -Cx data remains negative to date. -Improving on mucolytic and nebs, will add antitussives  Acute Diastolic CHF -Last ECHO from 2009 with EF of 65%.  -Echo 10/27 with EF 65-70%, and grade 2 diastolic dysfunction -diuresed well overnight>>2900cc ou -lasix held per cards 10/29 as Cr continuing to trend up since admission( though pt report her last cr that was checked oupt was 1.89) -Continue to monitor  renal function closely -KCl supplementation was dc'ed 10/28 as K was 4.9 and she is also on an ACE-I. K  remains stable at 4.6 10/31 - recheck BNP in a.m. Down to 1503 from 3554 on admission, discussed with cards today 11/1 and  Lasix  Resumed at lower dose(40 bid)- creatinine now improved -Cardiology following Atrial Fibrillation -cr continuing to trend up today so sotalol was changed to once daily per cards on 10/29, will continue current dose -continue cardizem, rate controlled -also pt with significant fall history, so coumadin dc'ed 10/29 per cards and pt now changed back to plavix abd ASA continued -Cardiology following, appreciate the assistance.  IDDM -Fair control. -Continue current regimen.  Hypothyroidism -TSH ok. -Continue synthroid.  CKD Stage III -Cr trending up as above and lasix held and sotalol dose decreased>> follow closely as above -He is in improved to 1.6 on 10/31, follow and recheck Hyponatremia -Likely secondary to CHF that she's been off her Lasix, follow and volume excess discussed above. Code Status: Full Code Family Communication: directly with  Patient   Disposition Plan: Anticipate home when medically stable with hh services   Consultants:  Cardiology   Antibiotics:  Levaquin 06/07/2013-->   Subjective: States feeling better today - breathing better, decreased cough. Denies CP.  Objective: Filed Vitals:   06/12/13 1500 06/12/13 2042 06/13/13 0514 06/13/13 0939  BP: 140/72 170/49 168/46   Pulse: 70 62 61   Temp: 97.4 F (36.3 C) 97.5 F (36.4 C) 98.3 F (36.8 C)   TempSrc: Oral Oral Oral   Resp: 18 18 18    Height:      Weight:   81.693 kg (180 lb 1.6 oz)   SpO2: 98% 97% 98% 98%    Intake/Output Summary (Last 24 hours) at 06/13/13 1318 Last data filed at 06/13/13 0514  Gross per 24 hour  Intake      0 ml  Output    900 ml  Net   -900 ml   Filed Weights   06/11/13 0608 06/12/13 0600 06/13/13 0514  Weight: 81.6 kg (179 lb 14.3 oz) 81.965 kg (180 lb 11.2 oz) 81.693 kg (180 lb 1.6 oz)    Exam:   General:  AA Ox3  Cardiovascular: irregular, rate controlled  Respiratory: No wheezing, decreased breath sounds at the bases,  Abdomen: obese/S/NT/ND/+BS  Extremities: no edema, no cyanosis  Neurologic:  Grossly intact and non-focal.  Data Reviewed: Basic Metabolic Panel:  Recent Labs Lab 06/08/13 0535 06/09/13 0440 06/10/13 0526 06/11/13 0500 06/12/13 1213  NA 137 132* 133* 132* 130*  K 3.9 4.9 4.9 5.1 4.6  CL 101 97 95* 93* 94*  CO2 25 23 27 24  26  GLUCOSE 146* 187* 181* 213* 180*  BUN 29* 34* 42* 40* 42*  CREATININE 1.47* 1.66* 1.88* 1.71* 1.61*  CALCIUM 9.1 9.4 9.3 9.9 9.7   Liver Function Tests: No results found for this basename: AST, ALT, ALKPHOS, BILITOT, PROT, ALBUMIN,  in the last 168 hours No results found for this basename: LIPASE, AMYLASE,  in the last 168 hours No results found for this basename: AMMONIA,  in the last 168 hours CBC:  Recent Labs Lab 06/07/13 1300 06/08/13 0535 06/09/13 0440 06/10/13 0526  WBC 11.2* 9.8 9.1 6.4  NEUTROABS 9.6*  --   --   --   HGB  10.4* 9.0* 9.4* 9.6*  HCT 31.1* 27.5* 28.3* 29.5*  MCV 86.9 87.3 87.1 87.0  PLT 242 207 252 302   Cardiac Enzymes:  Recent Labs Lab 06/07/13 1830 06/07/13 2220 06/08/13 0535  TROPONINI <0.30 <0.30 <0.30   BNP (last 3 results)  Recent Labs  06/07/13 1300 06/13/13 0406  PROBNP 3554.0* 1503.0*   CBG:  Recent Labs Lab 06/12/13 1140 06/12/13 1702 06/12/13 2135 06/13/13 0724 06/13/13 1203  GLUCAP 172* 113* 176* 148* 141*    Recent Results (from the past 240 hour(s))  CULTURE, BLOOD (ROUTINE X 2)     Status: None   Collection Time    06/07/13  1:00 PM      Result Value Range Status   Specimen Description BLOOD RIGHT ARM  5 ML IN [facility] BOTTLE   Final   Special Requests NONE   Final   Culture  Setup Time     Final   Value: 06/07/2013 17:46     Performed at [facility]   Culture     Final   Value: NO GROWTH 5 DAYS     Performed at [facility]   Report Status 06/13/2013 FINAL   Final  CULTURE, BLOOD (ROUTINE X 2)     Status: None   Collection Time    06/07/13  1:45 PM      Result Value Range Status   Specimen Description BLOOD LEFT ARM  8 ML IN [facility] BOTTLE   Final   Special Requests NONE   Final   Culture  Setup Time     Final   Value: 06/07/2013 17:45     Performed at [facility]   Culture     Final   Value: NO GROWTH 5 DAYS     Performed at [facility]   Report Status 06/13/2013 FINAL   Final     Studies: No results found.  Scheduled Meds: . amLODipine  5 mg Oral Daily  . aspirin EC  81 mg Oral Daily  . benzonatate  100 mg Oral TID  . budesonide-formoterol  2 puff Inhalation BID  . clopidogrel  75 mg Oral Q breakfast  . dextromethorphan-guaiFENesin  1 tablet Oral BID  . diltiazem  120 mg Oral Q12H  . docusate sodium  100 mg Oral BID  . feeding supplement (GLUCERNA SHAKE)  237 mL Oral BID PC  . heparin  5,000 Units Subcutaneous BID  . insulin aspart  0-9 Units Subcutaneous TID WC  . insulin glargine  13 Units  Subcutaneous Q breakfast  . insulin glargine  35 Units Subcutaneous QHS  . isosorbide mononitrate  30 mg Oral Daily  . levofloxacin  500 mg Oral Q48H  . levothyroxine  50 mcg Oral QAC breakfast  . lisinopril  10 mg Oral Daily  . montelukast  10 mg Oral  QHS  . OxyCODONE  10 mg Oral Q12H  . pantoprazole  40 mg Oral Daily  . polyethylene glycol  17 g Oral Daily  . ramelteon  8 mg Oral QHS  . sodium chloride  3 mL Intravenous Q12H  . sotalol  120 mg Oral QHS   Continuous Infusions:   Principal Problem:   CAP (community acquired pneumonia) Active Problems:   DM   OBSTRUCTIVE SLEEP APNEA   Atrial fibrillation   COPD with acute exacerbation   CHF (congestive heart failure), NYHA class II   Secondary pulmonary hypertension   Hyposmolality and/or hyponatremia   Acute on chronic diastolic heart failure    Time spent: 25 minutes    Makayia Duplessis C  [facility] Pager [phone]  If 7PM-7AM, please contact night-coverage at [email or web address], password [facility] 06/13/2013, 1:18 PM  LOS: 6 days

## 2013-06-13 NOTE — Progress Notes (Signed)
Pharmacy: Recommendations for Sotalol   Discussed Sotalol dosing with Dr. Donnie Aho today.    Sotalol is contraindicated per the manufacture for the treatment of A.Fib in patients a CrCl < 40 ml/min.  Pt. Currently has a CrCl of 27 ml/min and recommendation made to choose different agent for use.   Additional Dosing info:  For A.Fib if CrCl is 40-60 ml/min - administer dose Q24h For *Ventricular arrhythmias - for a CrCl of 10-29 ml/min - Administer dose every 48h.   I will defer decision of dose to cardiologist - Would recommend using a different agent or dosing sotalol at at least Q48h.   Thank you Tiffanyann Deroo, Loma Messing PharmD Pager #: [phone] 3:45 PM 06/13/2013

## 2013-06-13 NOTE — Progress Notes (Addendum)
Subjective:  Still coughing, no chest pain. Still some productive cough. Not much shortness of breath.  Objective:  Vital Signs in the last 24 hours: BP 168/46  Pulse 61  Temp(Src) 98.3 F (36.8 C) (Oral)  Resp 18  Ht 4' 11.84" (1.52 m)  Wt 81.693 kg (180 lb 1.6 oz)  BMI 35.36 kg/m2  SpO2 98%  Physical Exam: Pleasant white female who is coughing at bedside Lungs: Rhonchi bilaterally  Cardiac:  Regular rhythm, normal S1 and S2, no S3 Abdomen:  Soft, nontender, no masses Extremities:  No edema present  Intake/Output from previous day: 10/31 0701 - 11/01 0700 In: 480 [P.O.:480] Out: 1250 [Urine:1250] Weight Filed Weights   06/11/13 0608 06/12/13 0600 06/13/13 0514  Weight: 81.6 kg (179 lb 14.3 oz) 81.965 kg (180 lb 11.2 oz) 81.693 kg (180 lb 1.6 oz)    Lab Results: Basic Metabolic Panel:  Recent Labs  19/14/78 0500 06/12/13 1213  NA 132* 130*  K 5.1 4.6  CL 93* 94*  CO2 24 26  GLUCOSE 213* 180*  BUN 40* 42*  CREATININE 1.71* 1.61*   BNP    Component Value Date/Time   PROBNP 1503.0* 06/13/2013 0406   Telemetry: Currently normal sinus rhythm. QTC is 0.475.  Assessment/Plan:  1. Paroxysmal atrial fibrillation currently maintaining sinus rhythm on sotalol adjusted for renal function 2. Chronic kidney disease 3. Coronary artery disease with previous stenting 4. Diastolic CHF resolved worsened by a fib.  Recommendations:  Continue sotalol and treatment for other conditions. No anticoagulation per previous consultants. She was on furosemide at home prior to coming in to the hospital.  I would resume 40 BID instead of 80 BID on discharge.   SHe has excellent systolic function and suspect that the elevated BNP on presentation was due to a fib/diastolic CHF.  She will need to have pharmacy assist with proper dose of sotalol on D/c.     Darden Palmer  MD [facility]  06/13/2013, 8:33 AM

## 2013-06-14 LAB — BASIC METABOLIC PANEL
BUN: 40 mg/dL — ABNORMAL HIGH (ref 6–23)
CO2: 28 mEq/L (ref 19–32)
Calcium: 9.8 mg/dL (ref 8.4–10.5)
Chloride: 96 mEq/L (ref 96–112)
Creatinine, Ser: 1.75 mg/dL — ABNORMAL HIGH (ref 0.50–1.10)

## 2013-06-14 MED ORDER — PREDNISONE 20 MG PO TABS
30.0000 mg | ORAL_TABLET | Freq: Once | ORAL | Status: AC
  Administered 2013-06-14: 15:00:00 30 mg via ORAL
  Filled 2013-06-14: qty 1

## 2013-06-14 MED ORDER — OXYCODONE-ACETAMINOPHEN 5-325 MG PO TABS
1.0000 | ORAL_TABLET | Freq: Four times a day (QID) | ORAL | Status: DC | PRN
  Administered 2013-06-14 – 2013-06-16 (×7): 2 via ORAL
  Administered 2013-06-16: 1 via ORAL
  Filled 2013-06-14 (×8): qty 2

## 2013-06-14 NOTE — Progress Notes (Signed)
[facility] PROGRESS NOTE  [name] Oran BJY:[id number] DOB: 02/23/1934 DOA: 06/07/2013 PCP: Gweneth Dimitri, MD  Assessment/Plan: SOB -Likely 2/2 CAP and acute diastolic CHF. -See #s2, 3 below for details. -Now on 2-2 &1/2 nasal cannula-states she is usually on 2 L  at home continue to follow and wean oxygen as tolerated    CAP -Continue levaquin for now. -Cx data remains negative to date. -Improving on mucolytic and nebs, continue antitussives  Acute Diastolic CHF -Last ECHO from 2009 with EF of 65%.  -Echo 10/27 with EF 65-70%, and grade 2 diastolic dysfunction -diuresed well overnight>>2900cc ou -lasix held per cards 10/29 as Cr continuing to trend up since admission( though pt report her last cr that was checked oupt was 1.89) -Continue to monitor  renal function closely -KCl supplementation was dc'ed 10/28 as K was 4.9 and she is also on an ACE-I. K  remains stable at 4.5 - recheck BNP. Down to 1503 on 10/31 from 3554 on admission, discussed with cards today 11/1 and Lasix Resumed at lower dose(40 bid)- creatinine now improved  -Cardiology following  Atrial Fibrillation -cr continuing to trend up today so sotalol was changed to once daily per cards on 10/29, but on follow up today dc'ed per Dr Donnie Aho as contraindication with her current renal fuction>> cards to follow up in am for further recs -continue cardizem, rate controlled -also pt with significant fall history, so coumadin dc'ed 10/29 per cards and pt now changed back to plavix abd ASA continued -Cardiology following, appreciate the assistance.  IDDM -Fair control. -Continue current regimen.  Hypothyroidism -TSH ok. -Continue synthroid.  CKD Stage III -Cr trending up as above and lasix held and sotalol dose decreased>> follow closely as above -He is in improved to 1.6 on today, follow Hyponatremia -Likely secondary to CHF that she's been off her Lasix, follow and resume his excess discussed  above. FOOT pain, bil - arthritis vs tendinitis -give dose of steroid, due to her CKD will avoid NSAIDs -continue pain management, change vicodin to percocet as she states it does not help - noted per PT note that Pt not interested in Rehab/SNF Code Status: Full Code Family Communication: Patient and family at bedside Disposition Plan: Anticipate home when medically stable >> await PT/OT evals.   Consultants:  Cardiology   Antibiotics:  Levaquin 06/07/2013-->   Subjective: States breathing better and cough better but now c/o of pian on dorsum of feet bil distal to ankle area making it difficult for her to walk  Objective: Filed Vitals:   06/13/13 2230 06/14/13 0437 06/14/13 0836 06/14/13 1235  BP: 145/48 143/50  166/47  Pulse:  60  60  Temp:  98.6 F (37 C)  98.6 F (37 C)  TempSrc:  Oral  Oral  Resp:  18  20  Height:      Weight:  81.765 kg (180 lb 4.2 oz)    SpO2:  98% 98% 98%    Intake/Output Summary (Last 24 hours) at 06/14/13 1348 Last data filed at 06/14/13 1025  Gross per 24 hour  Intake    120 ml  Output   1270 ml  Net  -1150 ml   Filed Weights   06/12/13 0600 06/13/13 0514 06/14/13 0437  Weight: 81.965 kg (180 lb 11.2 oz) 81.693 kg (180 lb 1.6 oz) 81.765 kg (180 lb 4.2 oz)    Exam:   General:  AA Ox3  Cardiovascular: irregular, rate controlled  Respiratory: No wheezing, decreased breath sounds at the bases,  Abdomen: obese/S/NT/ND/+BS  Extremities: no edema, no cyanosis. Feet with no erythema, +tenderness on dorsum of feet bil distal to ankle area, no induration  Neurologic:  Grossly intact and non-focal.  Data Reviewed: Basic Metabolic Panel:  Recent Labs Lab 06/09/13 0440 06/10/13 0526 06/11/13 0500 06/12/13 1213 06/14/13 0410  NA 132* 133* 132* 130* 133*  K 4.9 4.9 5.1 4.6 4.5  CL 97 95* 93* 94* 96  CO2 23 27 24 26 28   GLUCOSE 187* 181* 213* 180* 117*  BUN 34* 42* 40* 42* 40*  CREATININE 1.66* 1.88* 1.71* 1.61* 1.75*  CALCIUM  9.4 9.3 9.9 9.7 9.8   Liver Function Tests: No results found for this basename: AST, ALT, ALKPHOS, BILITOT, PROT, ALBUMIN,  in the last 168 hours No results found for this basename: LIPASE, AMYLASE,  in the last 168 hours No results found for this basename: AMMONIA,  in the last 168 hours CBC:  Recent Labs Lab 06/08/13 0535 06/09/13 0440 06/10/13 0526  WBC 9.8 9.1 6.4  HGB 9.0* 9.4* 9.6*  HCT 27.5* 28.3* 29.5*  MCV 87.3 87.1 87.0  PLT 207 252 302   Cardiac Enzymes:  Recent Labs Lab 06/07/13 1830 06/07/13 2220 06/08/13 0535  TROPONINI <0.30 <0.30 <0.30   BNP (last 3 results)  Recent Labs  06/07/13 1300 06/13/13 0406  PROBNP 3554.0* 1503.0*   CBG:  Recent Labs Lab 06/13/13 0724 06/13/13 1203 06/13/13 1637 06/13/13 2122 06/14/13 1145  GLUCAP 148* 141* 117* 157* 157*    Recent Results (from the past 240 hour(s))  CULTURE, BLOOD (ROUTINE X 2)     Status: None   Collection Time    06/07/13  1:00 PM      Result Value Range Status   Specimen Description BLOOD RIGHT ARM  5 ML IN [facility] BOTTLE   Final   Special Requests NONE   Final   Culture  Setup Time     Final   Value: 06/07/2013 17:46     Performed at [facility]   Culture     Final   Value: NO GROWTH 5 DAYS     Performed at [facility]   Report Status 06/13/2013 FINAL   Final  CULTURE, BLOOD (ROUTINE X 2)     Status: None   Collection Time    06/07/13  1:45 PM      Result Value Range Status   Specimen Description BLOOD LEFT ARM  8 ML IN [facility] BOTTLE   Final   Special Requests NONE   Final   Culture  Setup Time     Final   Value: 06/07/2013 17:45     Performed at [facility]   Culture     Final   Value: NO GROWTH 5 DAYS     Performed at [facility]   Report Status 06/13/2013 FINAL   Final     Studies: No results found.  Scheduled Meds: . aspirin EC  81 mg Oral Daily  . benzonatate  100 mg Oral TID  . budesonide-formoterol  2 puff Inhalation BID  .  clopidogrel  75 mg Oral Q breakfast  . dextromethorphan-guaiFENesin  1 tablet Oral BID  . diltiazem  120 mg Oral Q12H  . docusate sodium  100 mg Oral BID  . feeding supplement (GLUCERNA SHAKE)  237 mL Oral BID PC  . furosemide  40 mg Oral BID  . heparin  5,000 Units Subcutaneous BID  . insulin aspart  0-9 Units Subcutaneous TID  WC  . insulin glargine  13 Units Subcutaneous Q breakfast  . insulin glargine  35 Units Subcutaneous QHS  . isosorbide mononitrate  30 mg Oral Daily  . levofloxacin  500 mg Oral Q48H  . levothyroxine  50 mcg Oral QAC breakfast  . lisinopril  10 mg Oral Daily  . montelukast  10 mg Oral QHS  . OxyCODONE  10 mg Oral Q12H  . pantoprazole  40 mg Oral Daily  . polyethylene glycol  17 g Oral Daily  . predniSONE  30 mg Oral Once  . ramelteon  8 mg Oral QHS  . sodium chloride  3 mL Intravenous Q12H   Continuous Infusions:   Principal Problem:   CAP (community acquired pneumonia) Active Problems:   DM   OBSTRUCTIVE SLEEP APNEA   Atrial fibrillation   COPD with acute exacerbation   CHF (congestive heart failure), NYHA class II   Secondary pulmonary hypertension   Hyposmolality and/or hyponatremia   Acute on chronic diastolic heart failure    Time spent: 35 minutes    Kindra Bickham C  [facility] Pager [phone]  If 7PM-7AM, please contact night-coverage at [email or web address], password [facility] 06/14/2013, 1:48 PM  LOS: 7 days

## 2013-06-14 NOTE — Progress Notes (Signed)
Subjective:  Still with productive cough. Complains of a lot of arthritis involving her feet. No chest pain. Discussion with the pharmacist yesterday who notes that with her creatinine clearance the sotalol is contraindicated for treatment of atrial fibrillation.  Objective:  Vital Signs in the last 24 hours: BP 143/50  Pulse 60  Temp(Src) 98.6 F (37 C) (Oral)  Resp 18  Ht 4' 11.84" (1.52 m)  Wt 81.765 kg (180 lb 4.2 oz)  BMI 35.39 kg/m2  SpO2 98%  Physical Exam: Pleasant white female who is coughing at bedside Lungs: Rhonchi bilaterally  Cardiac:  Regular rhythm, normal S1 and S2, no S3 Abdomen:  Soft, nontender, no masses Extremities:  No edema present  Intake/Output from previous day: 11/01 0701 - 11/02 0700 In: -  Out: 750 [Urine:750] Weight Filed Weights   06/12/13 0600 06/13/13 0514 06/14/13 0437  Weight: 81.965 kg (180 lb 11.2 oz) 81.693 kg (180 lb 1.6 oz) 81.765 kg (180 lb 4.2 oz)    Lab Results: Basic Metabolic Panel:  Recent Labs  16/10/96 1213 06/14/13 0410  NA 130* 133*  K 4.6 4.5  CL 94* 96  CO2 26 28  GLUCOSE 180* 117*  BUN 42* 40*  CREATININE 1.61* 1.75*   BNP    Component Value Date/Time   PROBNP 1503.0* 06/13/2013 0406   Telemetry: Currently normal sinus rhythm. QTC is 0.475.  Assessment/Plan:  1. Paroxysmal atrial fibrillation currently maintaining sinus rhythm on sotalol however at her current level of renal function this medicine is contraindicated. She has tolerated it well previously. 2. Chronic kidney disease 3. Coronary artery disease with previous stenting 4. Diastolic CHF resolved worsened by a fib.  Recommendations:  Discussion yesterday with the pharmacist as well as her hospitalist. 1. Discontinue sotalol until her attending cardiologist who sees her an outpatient and decide if he was to continue this despite the contraindications 2. Monitor for recurrence of atrial fibrillation 3. Initiate furosemide at lower dose of 40  mg twice daily. To treat diastolic dysfunction.     Darden Palmer  MD [facility] Card[facility]  06/14/2013, 8:56 AM

## 2013-06-15 DIAGNOSIS — M159 Polyosteoarthritis, unspecified: Secondary | ICD-10-CM

## 2013-06-15 DIAGNOSIS — M79609 Pain in unspecified limb: Secondary | ICD-10-CM

## 2013-06-15 LAB — GLUCOSE, CAPILLARY
Glucose-Capillary: 114 mg/dL — ABNORMAL HIGH (ref 70–99)
Glucose-Capillary: 306 mg/dL — ABNORMAL HIGH (ref 70–99)
Glucose-Capillary: 133 mg/dL — ABNORMAL HIGH (ref 70–99)
Glucose-Capillary: 156 mg/dL — ABNORMAL HIGH (ref 70–99)

## 2013-06-15 LAB — BASIC METABOLIC PANEL
BUN: 44 mg/dL — ABNORMAL HIGH (ref 6–23)
CO2: 28 mEq/L (ref 19–32)
Calcium: 9.7 mg/dL (ref 8.4–10.5)
Chloride: 94 mEq/L — ABNORMAL LOW (ref 96–112)
Creatinine, Ser: 1.81 mg/dL — ABNORMAL HIGH (ref 0.50–1.10)

## 2013-06-15 MED ORDER — COLCHICINE 0.6 MG PO TABS
0.3000 mg | ORAL_TABLET | Freq: Every day | ORAL | Status: DC
  Administered 2013-06-16: 09:00:00 0.3 mg via ORAL
  Filled 2013-06-15: qty 0.5

## 2013-06-15 MED ORDER — AMIODARONE HCL 200 MG PO TABS
400.0000 mg | ORAL_TABLET | Freq: Two times a day (BID) | ORAL | Status: DC
  Administered 2013-06-15 – 2013-06-16 (×3): 400 mg via ORAL
  Filled 2013-06-15 (×4): qty 2

## 2013-06-15 MED ORDER — COLCHICINE 0.6 MG PO TABS
1.2000 mg | ORAL_TABLET | Freq: Once | ORAL | Status: AC
  Administered 2013-06-15: 22:00:00 1.2 mg via ORAL
  Filled 2013-06-15: qty 2

## 2013-06-15 MED ORDER — OXYCODONE HCL ER 20 MG PO T12A
20.0000 mg | EXTENDED_RELEASE_TABLET | Freq: Two times a day (BID) | ORAL | Status: DC
  Administered 2013-06-15 – 2013-06-16 (×2): 20 mg via ORAL
  Filled 2013-06-15 (×2): qty 1

## 2013-06-15 MED ORDER — OXYCODONE HCL ER 10 MG PO T12A
10.0000 mg | EXTENDED_RELEASE_TABLET | Freq: Once | ORAL | Status: AC
  Administered 2013-06-15: 15:00:00 10 mg via ORAL
  Filled 2013-06-15: qty 1

## 2013-06-15 NOTE — Progress Notes (Signed)
OT Cancellation Note  Patient Details Name: [name] MRN: [id number] DOB: [birth year]   Cancelled Treatment:    Reason Eval/Treat Not Completed: Other (comment) pt just back from toileting and lunch arrived. Nursing tech present. Will try back another time.  [name] [phone] 06/15/2013, 12:23 PM

## 2013-06-15 NOTE — Progress Notes (Signed)
Physical Therapy Treatment Patient Details Name: LARAE CAISON MRN: [id number] DOB: [birth year] Today's Date: 06/15/2013 Time: 0940-1005 PT Time Calculation (min): 25 min  PT Assessment / Plan / Recommendation  History of Present Illness Yun L Lomax is a 79 y.o. female with PMH of COPD/chronic respiratory failure on home o2, CHF, PAF, CAD s/p PTCA/stent, IDDM, HTN, HPL, OSA on CPAP presented with SOB, productive cough, green/yellow sputum for 4-5 days, associated with fever, chills; denies exertional symptoms, no chest pain, no nausea, vomiting or diarrhea; no focal neuro symptoms;.   PT Comments   Assisted pt OOB to amb to bathroom.  Stated her feet feel better but still c/o pain L>R.  Pt declined to amb in hallway.    Follow Up Recommendations  Home health PT;Supervision for mobility/OOB     Does the patient have the potential to tolerate intense rehabilitation     Barriers to Discharge        Equipment Recommendations       Recommendations for Other Services    Frequency     Progress towards PT Goals Progress towards PT goals: Progressing toward goals  Plan Current plan remains appropriate    Precautions / Restrictions Precautions Precautions: Fall Precaution Comments: O2 dependent Restrictions Weight Bearing Restrictions: No    Pertinent Vitals/Pain C/o 6/10 L foot pain    Mobility  Bed Mobility Bed Mobility: Supine to Sit Supine to Sit: 5: Supervision;4: Min guard Details for Bed Mobility Assistance: increased time  Transfers Transfers: Sit to Stand;Stand to Sit Sit to Stand: 5: Supervision;4: Min guard;From bed;From toilet Stand to Sit: 4: Min guard;To toilet;To chair/3-in-1 Details for Transfer Assistance: increased time and extra assist off reg height commode Ambulation/Gait Ambulation/Gait Assistance: 5: Supervision;4: Min guard Ambulation Distance (Feet): 20 Feet (10 feet x 2 to and from BR only would pt agree) Assistive device: Rolling  walker Ambulation/Gait Assistance Details: Pt only agreed to amvb to and from bathroom.  "I can't walk in the hallway with my feet" Gait Pattern: Step-to pattern;Decreased stride length;Antalgic;Decreased step length - left;Decreased step length - right;Decreased stance time - left Gait velocity: decreased    PT Goals (current goals can now be found in the care plan section)    Visit Information  Last PT Received On: 06/15/13 Assistance Needed: +1 History of Present Illness: JENISSA TYRELL is a 78 y.o. female with PMH of COPD/chronic respiratory failure on home o2, CHF, PAF, CAD s/p PTCA/stent, IDDM, HTN, HPL, OSA on CPAP presented with SOB, productive cough, green/yellow sputum for 4-5 days, associated with fever, chills; denies exertional symptoms, no chest pain, no nausea, vomiting or diarrhea; no focal neuro symptoms;.    Subjective Data      Cognition       Balance     End of Session PT - End of Session Equipment Utilized During Treatment: Gait belt Activity Tolerance: Patient limited by pain Patient left: in chair;with call bell/phone within reach Nurse Communication: Mobility status   Felecia Shelling  PTA [facility]  Acute  Rehab Pager      [phone]

## 2013-06-15 NOTE — Progress Notes (Signed)
PHARMACIST-PHYSICIAN COMMUNICATION  Topic:  Potential Drug Interactions with Amiodarone  A review of this patient's current medication profile found the following drugs that may interact with Amiodarone:   Diltiazem -- Calcium channel blockers may increase the risk of bradycardia, AV block and myocardial depression.   Levaquin -- Torsades de pointes risk may be increased with concurrent use.  If concurrent therapy cannot be avoided, monitor the QTc interval and keep potassium and magnesium levels within normal limits.   Polo Riley R.Ph. 06/15/2013 2:06 PM

## 2013-06-15 NOTE — Progress Notes (Signed)
ANTIBIOTIC CONSULT NOTE - FOLLOW UP  Pharmacy Consult for levaquin Indication: r/o PNA  Allergies  Allergen Reactions  . Cefuroxime Axetil   . Cephalexin   . Clarithromycin   . Doxycycline     REACTION: rash on legs and feet, edema  . Erythromycin   . Tetracycline     Assessment: 79 yoF [facility] for r/o PNA given SOB, productive cough, fever. CXR with findings suggest of fluid overload with vascular congestion, no consolidation or infiltrate. MD order to start Levaquin for possible PNA.   10/26 >> Levaquin >>  Tmax: afeb WBCs: none today, prev 6.4K Renal: Scr 1.81, CG 25   10/26 blood x 2: NGF  Today is D#9 of abx, plan 10 days total per MD. Scr trending up slightly but dose still appropriate. Amiodarone added 11/3, QTc 464 msec on 10/27.   Plan:   Continue Levaquin 500 mg q48h  F/u stopping abx tomorrow.  Geoffry Paradise, PharmD, BCPS Pager: [phone] 2:08 PM Pharmacy #: [id number]

## 2013-06-15 NOTE — Progress Notes (Signed)
[facility] PROGRESS NOTE  [name] ZOX:[id number] DOB: 1934/02/09 DOA: 06/07/2013 PCP: Gweneth Dimitri, MD  Assessment/Plan: SOB -Likely 2/2 CAP and acute diastolic CHF. -See #s2, 3 below for details. -Now on 2-2 &1/2 nasal cannula-states she is usually on 2 L [place] at home continue to follow   CAP -Continue levaquin for now. -Cx data remains negative to date. -Improving on mucolytic and nebs, continue antitussives  Acute Diastolic CHF -Last ECHO from 2009 with EF of 65%.  -Echo 10/27 with EF 65-70%, and grade 2 diastolic dysfunction -diuresed well overnight>>2900cc ou -lasix held per cards 10/29 as Cr continuing to trend up since admission( though pt report her last cr that was checked oupt was 1.89) -Continue to monitor  renal function closely -KCl supplementation was dc'ed 10/28 as K was 4.9 and she is also on an ACE-I. K  remains stable at 4.5 - recheck BNP. Down to 1503 on 10/31 from 3554 on admission, discussed with cards today 11/1 and Lasix Resumed at lower dose(40 bid)- creatinine now improved  -Cardiology following  Atrial Fibrillation -cr continuing to trend up today so sotalol was changed to once daily per cards on 10/29, but on follow up today 11/2 dc'ed per Dr Donnie Aho as contraindication with her current renal fuction>> discussed with Dr Eldridge Dace today and he recommneds to start amio at 400bid, and plan to d/c pt on 400daily -continue cardizem, rate controlled -also pt with significant fall history, so coumadin dc'ed 10/29 per cards and pt now changed back to plavix abd ASA continued -Cardiology following, appreciate the assistance.  IDDM -Fair control. -Continue current regimen.  Hypothyroidism -TSH ok. -Continue synthroid.  CKD Stage III -Cr trending up as above and lasix held and sotalol dose decreased>> follow closely as above -cr back  Up to 1.8 today on lasix, follow Hyponatremia -Likely secondary to CHF that she's been off her Lasix,  follow and resume his excess discussed above. FOOT pain, bil - arthritis vs tendinitis -improved s/p dose of prednisone -continue pain management>> will increase oxycontin  And follow Code Status: Full Code Family Communication: Patient bedside Disposition Plan: Anticipate home when medically stable >> await PT/OT evals.   Consultants:  Cardiology   Antibiotics:  Levaquin 06/07/2013-->   Subjective: States foot pain better, but not resolved. Denies cp< no SOB Objective: Filed Vitals:   06/14/13 0836 06/14/13 1235 06/15/13 0538 06/15/13 0850  BP:  166/47 176/84   Pulse:  60 59   Temp:  98.6 F (37 C) 98.4 F (36.9 C)   TempSrc:  Oral Oral   Resp:  20 19   Height:      Weight:   81.743 kg (180 lb 3.4 oz)   SpO2: 98% 98% 98% 99%    Intake/Output Summary (Last 24 hours) at 06/15/13 1236 Last data filed at 06/15/13 0750  Gross per 24 hour  Intake    720 ml  Output   1300 ml  Net   -580 ml   Filed Weights   06/13/13 0514 06/14/13 0437 06/15/13 0538  Weight: 81.693 kg (180 lb 1.6 oz) 81.765 kg (180 lb 4.2 oz) 81.743 kg (180 lb 3.4 oz)    Exam:   General:  AA Ox3  Cardiovascular: irregular, rate controlled  Respiratory: No wheezing, decreased breath sounds at the bases,  Abdomen: obese/S/NT/ND/+BS  Extremities: no edema, no cyanosis. Feet with no erythema, +tenderness on dorsum of feet bil distal to ankle area, no induration  Neurologic:  Grossly intact and non-focal.  Data  Reviewed: Basic Metabolic Panel:  Recent Labs Lab 06/10/13 0526 06/11/13 0500 06/12/13 1213 06/14/13 0410 06/15/13 1152  NA 133* 132* 130* 133* 133*  K 4.9 5.1 4.6 4.5 4.2  CL 95* 93* 94* 96 94*  CO2 27 24 26 28 28   GLUCOSE 181* 213* 180* 117* 157*  BUN 42* 40* 42* 40* 44*  CREATININE 1.88* 1.71* 1.61* 1.75* 1.81*  CALCIUM 9.3 9.9 9.7 9.8 9.7   Liver Function Tests: No results found for this basename: AST, ALT, ALKPHOS, BILITOT, PROT, ALBUMIN,  in the last 168 hours No  results found for this basename: LIPASE, AMYLASE,  in the last 168 hours No results found for this basename: AMMONIA,  in the last 168 hours CBC:  Recent Labs Lab 06/09/13 0440 06/10/13 0526  WBC 9.1 6.4  HGB 9.4* 9.6*  HCT 28.3* 29.5*  MCV 87.1 87.0  PLT 252 302   Cardiac Enzymes: No results found for this basename: CKTOTAL, CKMB, CKMBINDEX, TROPONINI,  in the last 168 hours BNP (last 3 results)  Recent Labs  06/07/13 1300 06/13/13 0406  PROBNP 3554.0* 1503.0*   CBG:  Recent Labs Lab 06/14/13 0839 06/14/13 1145 06/14/13 1703 06/14/13 2108 06/15/13 0749  GLUCAP 114* 157* 180* 306* 133*    Recent Results (from the past 240 hour(s))  CULTURE, BLOOD (ROUTINE X 2)     Status: None   Collection Time    06/07/13  1:00 PM      Result Value Range Status   Specimen Description BLOOD RIGHT ARM  5 ML IN [facility] BOTTLE   Final   Special Requests NONE   Final   Culture  Setup Time     Final   Value: 06/07/2013 17:46     Performed at [facility]   Culture     Final   Value: NO GROWTH 5 DAYS     Performed at [facility]   Report Status 06/13/2013 FINAL   Final  CULTURE, BLOOD (ROUTINE X 2)     Status: None   Collection Time    06/07/13  1:45 PM      Result Value Range Status   Specimen Description BLOOD LEFT ARM  8 ML IN [facility] BOTTLE   Final   Special Requests NONE   Final   Culture  Setup Time     Final   Value: 06/07/2013 17:45     Performed at [facility]   Culture     Final   Value: NO GROWTH 5 DAYS     Performed at [facility]   Report Status 06/13/2013 FINAL   Final     Studies: No results found.  Scheduled Meds: . aspirin EC  81 mg Oral Daily  . benzonatate  100 mg Oral TID  . budesonide-formoterol  2 puff Inhalation BID  . clopidogrel  75 mg Oral Q breakfast  . dextromethorphan-guaiFENesin  1 tablet Oral BID  . diltiazem  120 mg Oral Q12H  . docusate sodium  100 mg Oral BID  . feeding supplement (GLUCERNA SHAKE)  237  mL Oral BID PC  . furosemide  40 mg Oral BID  . heparin  5,000 Units Subcutaneous BID  . insulin aspart  0-9 Units Subcutaneous TID WC  . insulin glargine  13 Units Subcutaneous Q breakfast  . insulin glargine  35 Units Subcutaneous QHS  . isosorbide mononitrate  30 mg Oral Daily  . levofloxacin  500 mg Oral Q48H  . levothyroxine  50 mcg Oral QAC breakfast  . lisinopril  10 mg Oral Daily  . montelukast  10 mg Oral QHS  . OxyCODONE  10 mg Oral Q12H  . pantoprazole  40 mg Oral Daily  . polyethylene glycol  17 g Oral Daily  . ramelteon  8 mg Oral QHS  . sodium chloride  3 mL Intravenous Q12H   Continuous Infusions:   Principal Problem:   CAP (community acquired pneumonia) Active Problems:   DM   OBSTRUCTIVE SLEEP APNEA   Atrial fibrillation   COPD with acute exacerbation   CHF (congestive heart failure), NYHA class II   Secondary pulmonary hypertension   Hyposmolality and/or hyponatremia   Acute on chronic diastolic heart failure    Time spent: 35 minutes    Sussie Minor C  [facility] Pager [phone]  If 7PM-7AM, please contact night-coverage at [email or web address], password [facility] 06/15/2013, 12:36 PM  LOS: 8 days

## 2013-06-15 NOTE — Progress Notes (Signed)
Subjective:  Still with productive cough. Complains of gout involving her feet. No chest pain.  creatinine clearance borderline for  the sotalol as treatment of atrial fibrillation.  Objective:  Vital Signs in the last 24 hours: BP 146/43  Pulse 54  Temp(Src) 98.3 F (36.8 C) (Oral)  Resp 20  Ht 4' 11.84" (1.52 m)  Wt 180 lb 3.4 oz (81.743 kg)  BMI 35.38 kg/m2  SpO2 100%  Physical Exam: Pleasant white female who is coughing at bedside Lungs: Rhonchi bilaterally  Cardiac:  Regular rhythm, normal S1 and S2, no S3 Abdomen:  Soft, nontender, no masses Extremities:  No edema present, swollen left foot at the ball of the foot  Intake/Output from previous day: 11/02 0701 - 11/03 0700 In: 480 [P.O.:480] Out: 1620 [Urine:1620] Weight Filed Weights   06/13/13 0514 06/14/13 0437 06/15/13 0538  Weight: 180 lb 1.6 oz (81.693 kg) 180 lb 4.2 oz (81.765 kg) 180 lb 3.4 oz (81.743 kg)    Lab Results: Basic Metabolic Panel:  Recent Labs  40/98/11 0410 06/15/13 1152  NA 133* 133*  K 4.5 4.2  CL 96 94*  CO2 28 28  GLUCOSE 117* 157*  BUN 40* 44*  CREATININE 1.75* 1.81*   BNP    Component Value Date/Time   PROBNP 1503.0* 06/13/2013 0406   Telemetry: Currently normal sinus rhythm. QTC is 0.475.  Assessment/Plan:  1. Paroxysmal atrial fibrillation currently maintaining sinus rhythm on sotalol however at her current level of renal function this medicine is contraindicated. She has tolerated it well previously. 2. Chronic kidney disease 3. Coronary artery disease with previous stenting 4. Diastolic CHF resolved worsened by a fib.  Recommendations:  Discussion with her hospitalist. 1. Discontinue sotalol , start Amiodarone 2. Monitor for recurrence of atrial fibrillation 3. Continue furosemide at lower dose of 40 mg twice daily. To treat diastolic dysfunction.  Watch renal function. 4.  Spoke to Dr. Donna Bernard about restarting Colcrys.     Fredric Mare  MD  [facility]  06/15/2013, 8:58 PM

## 2013-06-16 LAB — URIC ACID: Uric Acid, Serum: 9.6 mg/dL — ABNORMAL HIGH (ref 2.4–7.0)

## 2013-06-16 LAB — GLUCOSE, CAPILLARY: Glucose-Capillary: 156 mg/dL — ABNORMAL HIGH (ref 70–99)

## 2013-06-16 MED ORDER — POLYETHYLENE GLYCOL 3350 17 G PO PACK: 17.0000 g | PACK | Freq: Every day | ORAL | Status: DC

## 2013-06-16 MED ORDER — FUROSEMIDE 40 MG PO TABS: 40.0000 mg | ORAL_TABLET | Freq: Two times a day (BID) | ORAL | Status: DC

## 2013-06-16 MED ORDER — BUDESONIDE-FORMOTEROL FUMARATE 160-4.5 MCG/ACT IN AERO: 2.0000 | INHALATION_SPRAY | Freq: Two times a day (BID) | RESPIRATORY_TRACT | Status: DC

## 2013-06-16 MED ORDER — RAMELTEON 8 MG PO TABS: 8.0000 mg | ORAL_TABLET | Freq: Every day | ORAL | Status: DC | PRN

## 2013-06-16 MED ORDER — HEPARIN SODIUM (PORCINE) 5000 UNIT/ML IJ SOLN
5000.0000 [IU] | Freq: Three times a day (TID) | INTRAMUSCULAR | Status: DC
  Filled 2013-06-16 (×2): qty 1

## 2013-06-16 MED ORDER — GLUCERNA SHAKE PO LIQD: 237.0000 mL | Freq: Two times a day (BID) | ORAL | Status: DC

## 2013-06-16 MED ORDER — BENZONATATE 100 MG PO CAPS: 100.0000 mg | ORAL_CAPSULE | Freq: Three times a day (TID) | ORAL | Status: DC | PRN

## 2013-06-16 MED ORDER — OXYCODONE-ACETAMINOPHEN 5-325 MG PO TABS: 1.0000 | ORAL_TABLET | Freq: Four times a day (QID) | ORAL | Status: DC | PRN

## 2013-06-16 MED ORDER — LEVOFLOXACIN 500 MG PO TABS: 500.0000 mg | ORAL_TABLET | ORAL | Status: DC

## 2013-06-16 MED ORDER — AMIODARONE HCL 400 MG PO TABS: 400.0000 mg | ORAL_TABLET | Freq: Every day | ORAL | Status: DC

## 2013-06-16 MED ORDER — ALBUTEROL SULFATE HFA 108 (90 BASE) MCG/ACT IN AERS: 2.0000 | INHALATION_SPRAY | RESPIRATORY_TRACT | Status: DC | PRN

## 2013-06-16 MED ORDER — COLCHICINE 0.6 MG PO TABS: 0.3000 mg | ORAL_TABLET | Freq: Every day | ORAL | Status: DC

## 2013-06-16 MED ORDER — OXYCODONE HCL ER 10 MG PO T12A: 20.0000 mg | EXTENDED_RELEASE_TABLET | Freq: Two times a day (BID) | ORAL | Status: DC

## 2013-06-16 MED ORDER — ASPIRIN 81 MG PO TBEC: 81.0000 mg | DELAYED_RELEASE_TABLET | Freq: Every day | ORAL | Status: DC

## 2013-06-16 MED ORDER — FLEET ENEMA 7-19 GM/118ML RE ENEM
1.0000 | ENEMA | Freq: Once | RECTAL | Status: AC
  Administered 2013-06-16: 15:00:00 via RECTAL
  Filled 2013-06-16: qty 1

## 2013-06-16 NOTE — Progress Notes (Signed)
Patient left p/u by Wyonia Hough did not call back  As discussed earlier.

## 2013-06-16 NOTE — Progress Notes (Signed)
Met with patient and husband at bedside to offer [facility] Care Management services. Consents signed. Patient states she is having issues with walking due to gout. Husband has defibrillator and is unable to help patient much as he can not lift more than 10 pounds. Patient adamantly refuses SNF. Husband states he has a sister-in-law and son (from [place]) who will help patient at home. Patient will have home health services as well. Explained that [facility] Care Management will not interfere with home health services. She will receive post hospital discharge call and will be evaluated for monthly home visits. Made inpatient RNCM aware that [facility] Care Management will follow. Patient states she will need non-emergent transfer for home.  Raiford Noble, MSN-Ed, RN,BSN- [facility] [phone]

## 2013-06-16 NOTE — Progress Notes (Signed)
Discharged to [facility], alert and oriented, no distress noted, tried to call facility but RN said she does not know anything about this admission and the operator will call me back. SW Tresa Endo made aware.

## 2013-06-16 NOTE — Progress Notes (Signed)
Clinical Social Work Department BRIEF PSYCHOSOCIAL ASSESSMENT 06/16/2013  Patient:  [name], [name]     Account Number:  [id number]     Admit date:  06/07/2013  Clinical Social Worker:  Orpah Greek  Date/Time:  06/16/2013 02:39 PM  Referred by:  Physician  Date Referred:  06/16/2013 Referred for  SNF Placement   Other Referral:   Interview type:  Patient Other interview type:   and husband at bedside    PSYCHOSOCIAL DATA Living Status:  HUSBAND Admitted from facility:   Level of care:   Primary support name:  [name] (husband) ph#: [phone] Primary support relationship to patient:  SPOUSE Degree of support available:   good    CURRENT CONCERNS Current Concerns  Post-Acute Placement   Other Concerns:    SOCIAL WORK ASSESSMENT / PLAN CSW received referral 11/3 for possible SNF placement, but after speaking with patient with RNCM, [name] - patient declined SNF stating that she would prefer to return home with home health. CSW received call from RN, Delorise Shiner that after patient's husband & sister-in-law spoke with patient, she is now agreeable with plan for SNF.   Assessment/plan status:  Information/Referral to [facility] Other assessment/ plan:   Information/referral to community resources:   CSW completed FL2 and faxed information out to [facility] - provided bed offers, patient accepted bed offer @ [facility].    PATIENT'S/FAMILY'S RESPONSE TO PLAN OF CARE: Patient is now agreeable with plan for SNF after much convincing by the husband & sister-in-law who both would be having to take care of her at home. For discharge today.       Unice Bailey, LCSW [facility] Clinical Social Worker cell #: [phone]

## 2013-06-16 NOTE — Progress Notes (Signed)
Received call from patient's husband stating, "there has been a change in plans". States the plan is for rehab now. This Clinical research associate asked if he had made the inpatient staff aware. He states he had not. Requested that he let his nurse know so she can make the inpatient LCSW and inpatient RNCM aware of disposition plans of wanting SNF. Call made to inpatient RNCM to make aware of husband's call. Raiford Noble, MSN-Ed, [facility] Liaison605 [phone]

## 2013-06-16 NOTE — Discharge Summary (Signed)
Physician Discharge Summary  [name] JWJ:[id number] DOB: [birth year] DOA: 06/07/2013  PCP: Gweneth Dimitri, MD  Admit date: 06/07/2013 Discharge date: 06/16/2013  Time spent: >21minutes  Recommendations for Outpatient Follow-up:   Discharge Diagnoses:  Principal Problem:   CAP (community acquired pneumonia) Active Problems:   DM   OBSTRUCTIVE SLEEP APNEA   Atrial fibrillation   COPD with acute exacerbation   CHF (congestive heart failure), NYHA class II   Secondary pulmonary hypertension   Hyposmolality and/or hyponatremia   Acute on chronic diastolic heart failure   Discharge Condition: Improved/stable  Diet recommendation: Modified carbohydrate  Filed Weights   06/14/13 0437 06/15/13 0538 06/16/13 0539  Weight: 81.765 kg (180 lb 4.2 oz) 81.743 kg (180 lb 3.4 oz) 81 kg (178 lb 9.2 oz)    History of present illness:  The patient  is a 79 y.o. female with PMH of COPD/chronic respiratory failure on home o2, CHF, PAF, CAD s/p PTCA/stent, IDDM, HTN, HPL, OSA on CPAP presented with SOB, productive cough, green/yellow sputum for 4-5 days, associated with fever, chills; denies exertional symptoms, no chest pain, no nausea, vomiting or diarrhea; no focal neuro symptoms. In the ED she received IV lasix felt to be fluid overloaded    Hospital Course:  SOB  -Likely 2/2 CAP and acute diastolic CHF.  -See #s2, 3 below for details.  -Patient improved clinically with a treatment as discussed below and O2 was weaned down to 2-2 &1/2 nasal cannula-states she is usually on 2 L [place] at home continue to follow  Acute bronchitis on COPD -As discussed above upon admission a chest x-ray was done which showed findings suggestive of an element of fluid overload with vascular congestion but no current pulmonary edema, it was noted that she had no consolidation or infiltrate -She was empirically placed on antibiotics with Levaquin as well as bronchodilators and continued on supplemental  oxygen..  -Cultures were obtained data remains negative to date.  -She was also placed on mucolytics and antitussives and she slowly improved clinically. She has completed a full course of antibiotics, would not require any further antibiotics upon discharge she is to continue bronchodilators and her  home O2 as previously upon discharge Acute Diastolic CHF  -Last ECHO from 6213 with EF of 65%.  -Echo 10/27 with EF 65-70%, and grade 2 diastolic dysfunction  -Upon admission she was placed on IV Lasix, cardiology was consulted and they followed and on 10/29 as Cr continuing to trend up since admission( though pt report her last cr that was checked oupt was 1.89) Lasix was DC'd and her renal function continued to be monitored closely -KCl supplementation was dc'ed 10/28 as K was 4.9 and she is also on an ACE-I. K remains stable at 4.5  - recheck BNP. Down to 1503 on 10/31 from 3554 on admission, discussed with cards today 11/1 and Lasix Resumed at lower dose(40 bid)- creatinine now improved  -She has continued to improve clinically and was seen by Dr. Eldridge Dace on 11/3 and from his standpoint medically stable for discharge Atrial Fibrillation  -Patient had been on sotalol and Cardizem on admission and these were continue -Her cr was closely monitored continuing to trend up so sotalol was changed to once daily per cards on 10/29, but on follow up 11/2 dc'ed per Dr Donnie Aho as contraindication with her current renal fuction>> discussed with Dr Eldridge Dace 11/3 and he recommende to start amio at 400bid, and plan to d/c pt on 400daily  -continue cardizem, rate controlled>>  patient has tolerated this well and is to continue amiodarone on discharge and followup with Dr. Eldridge Dace  -also pt with significant fall history, so coumadin dc'ed 10/29 per cards she was changed back to plavix abd ASA which is to continue upon discharge followup outpatient with cardiology  IDDM  -Fair control.  -She is to Continue her  outpatient regimen upon discharge.  Hypothyroidism  -TSH ok.  -Continue synthroid.  CKD Stage III  -Cr trending up as above and lasix held and sotalol dose decreased>> follow closely as above  -cr back Up to 1.8 on 11/3 on lasix, )patient reported that time prior to hospitalization was 1.89.) Nursing home M.D. to monitor her renal function and fluid status closely and further adjust her medications as clinically appropriate  Hyponatremia  -Likely secondary to CHF that she's been off her Lasix, follow and resume his excess discussed above.  FOOT pain, bil/arthritis  -Patient complained of bilateral foot pain in the hospital-described to be on the dorsum of both feet just distal to the ankles. -her pain medications were adjusted-to oxycontin increased to 20 twice a day, and Percocets when necessary Gout -She was treated with a dose of prednisone and her colchicine resumed>> after initial dose of 1.2 mg-she is to continue on a renally adjusted dose of 0.3 mg daily and followup with PCP  Patient was seen by PT OT, initially she declined SNF, but in talking with her family to agree to go to a snf for rehabilitation  Procedures:  Echocardiogram Study Conclusions  - Left ventricle: Wall thickness was increased in a pattern of mild LVH. Systolic function was vigorous. The estimated ejection fraction was in the range of 65% to 70%. Features are consistent with a pseudonormal left ventricular filling pattern, with concomitant abnormal relaxation and increased filling pressure (grade 2 diastolic dysfunction). Doppler parameters are consistent with high ventricular filling pressure. - Aortic valve: Mild regurgitation. - Mitral valve: Severely calcified annulus. Mild to moderate regurgitation. - Left atrium: The atrium was mildly dilated. - Tricuspid valve: Moderate regurgitation. - Pulmonary arteries: Systolic pressure was moderately increased. PA peak pressure: 42mm    Consultations:  2-D echo  Discharge Exam: Filed Vitals:   06/16/13 1336  BP: 146/49  Pulse: 59  Temp: 97.9 F (36.6 C)  Resp: 20   Exam:  General: AA Ox3  Cardiovascular: irregular, rate controlled  Respiratory: No wheezing, decreased breath sounds at the bases,  Abdomen: obese/S/NT/ND/+BS  Extremities: no edema, no cyanosis. Feet with no erythema, +tenderness on dorsum of feet bil distal to ankle area, no induration  Neurologic: Grossly intact and non-focal.   Discharge Instructions  Discharge Orders   Future Appointments Provider Department Dept Phone   09/04/2013 12:45 PM Melony Overly, MD [facility] [phone]   10/20/2013 3:00 PM Everette Rank, MD [facility] [phone]   10/22/2013 1:30 PM Waymon Budge, MD [facility] [phone]   Future Orders Complete By Expires   Diet Carb Modified  As directed    Discharge instructions  As directed    Comments:     continue home O2  Upon discharge   Increase activity slowly  As directed    Increase activity slowly  As directed        Medication List    STOP taking these medications       sotalol 80 MG tablet  Commonly known as:  BETAPACE      TAKE these medications  albuterol 108 (90 BASE) MCG/ACT inhaler  Commonly known as:  VENTOLIN HFA  Inhale 2 puffs into the lungs every 4 (four) hours as needed for wheezing or shortness of breath.     amiodarone 400 MG tablet  Commonly known as:  PACERONE  Take 1 tablet (400 mg total) by mouth daily.     amLODipine 5 MG tablet  Commonly known as:  NORVASC  Take 5 mg by mouth daily.     aspirin 81 MG EC tablet  Take 1 tablet (81 mg total) by mouth daily.     azelastine 137 MCG/SPRAY nasal spray  Commonly known as:  ASTELIN  Place 2 sprays into the nose daily. Use in each nostril as directed     benzonatate 100 MG capsule  Commonly known as:  TESSALON  Take 1 capsule (100 mg total) by mouth 3 (three) times  daily as needed for cough.     budesonide-formoterol 160-4.5 MCG/ACT inhaler  Commonly known as:  SYMBICORT  Inhale 2 puffs into the lungs 2 (two) times daily.     cetirizine 10 MG tablet  Commonly known as:  ZYRTEC  Take 1 tablet (10 mg total) by mouth daily.     clopidogrel 75 MG tablet  Commonly known as:  PLAVIX  Take 75 mg by mouth daily.     colchicine 0.6 MG tablet  Take 0.5 tablets (0.3 mg total) by mouth daily.     cyclobenzaprine 10 MG tablet  Commonly known as:  FLEXERIL  Once a day     dextromethorphan-guaiFENesin 30-600 MG per 12 hr tablet  Commonly known as:  MUCINEX DM  Take 1 tablet by mouth as needed (cough). As needed     diltiazem 120 MG tablet  Commonly known as:  CARDIZEM  2 tablets daily     DITROPAN XL 10 MG 24 hr tablet  Generic drug:  oxybutynin  Take 10 mg by mouth daily.     feeding supplement (GLUCERNA SHAKE) Liqd  Take 237 mLs by mouth 2 (two) times daily after a meal.     furosemide 40 MG tablet  Commonly known as:  LASIX  Take 1 tablet (40 mg total) by mouth 2 (two) times daily.     glimepiride 4 MG tablet  Commonly known as:  AMARYL  Once a day     ibandronate 150 MG tablet  Commonly known as:  BONIVA  Take 150 mg by mouth every 30 (thirty) days. Take in the morning with a full glass of water, on an empty stomach, and do not take anything else by mouth or lie down for the next 30 min.     isosorbide mononitrate 30 MG 24 hr tablet  Commonly known as:  IMDUR  Take 30 mg by mouth daily.     LANTUS 100 UNIT/ML injection  Generic drug:  insulin glargine  13 units every morning and 35 units every evening     levothyroxine 50 MCG tablet  Commonly known as:  SYNTHROID, LEVOTHROID  1/2 tablet mon, wed, fri and 1 whole tablet the other days     lisinopril 10 MG tablet  Commonly known as:  PRINIVIL,ZESTRIL  Take 10 mg by mouth daily.     montelukast 10 MG tablet  Commonly known as:  SINGULAIR  Take 1 tablet (10 mg total) by mouth  at bedtime.     MULTIVITAMIN PO  Once a day     nitroGLYCERIN 0.4 MG SL tablet  Commonly known as:  NITROSTAT  As needed     omeprazole 20 MG tablet  Commonly known as:  PRILOSEC OTC  Take one 3-60 minutes before 1st and last meals of the day     OxyCODONE 10 mg T12a 12 hr tablet  Commonly known as:  OXYCONTIN  Take 2 tablets (20 mg total) by mouth every 12 (twelve) hours.     oxyCODONE-acetaminophen 5-325 MG per tablet  Commonly known as:  PERCOCET/ROXICET  Take 1-2 tablets by mouth every 6 (six) hours as needed for severe pain.     polyethylene glycol packet  Commonly known as:  MIRALAX / GLYCOLAX  Take 17 g by mouth daily.     PRENATAL VITAMIN PO  Once a day     ramelteon 8 MG tablet  Commonly known as:  ROZEREM  Take 1 tablet (8 mg total) by mouth daily as needed for sleep.       Allergies  Allergen Reactions  . Cefuroxime Axetil   . Cephalexin   . Clarithromycin   . Doxycycline     REACTION: rash on legs and feet, edema  . Erythromycin   . Tetracycline        Follow-up Information   Follow up with [facility], MD. (in 1-2weeks, call for appt upon discharge)    Specialty:  Family Medicine   Contact information:   [address] [place] [address] [phone]       Follow up with Corky Crafts., MD. (as directed, call for appt upon discharge)    Specialty:  Cardiology   Contact information:   [address] [place] [address] [phone]        The results of significant diagnostics from this hospitalization (including imaging, microbiology, ancillary and laboratory) are listed below for reference.    Significant Diagnostic Studies: Dg Chest 2 View  06/07/2013   CLINICAL DATA:  Shortness of breath and chest pain  EXAM: CHEST  2 VIEW  COMPARISON:  01/2013  FINDINGS: Mild cardiac enlargement. Mild to moderate vascular congestion with no pulmonary edema. No consolidation or infiltrate. Minimal pleural effusions.   IMPRESSION: Findings suggest an element of fluid overload with vascular congestion but no current pulmonary edema.   Electronically Signed   By: Esperanza Heir M.D.   On: 06/07/2013 13:41   Ct Head Wo Contrast  06/07/2013   CLINICAL DATA:  Fall days ago, takes blood thinners  EXAM: CT HEAD WITHOUT CONTRAST  TECHNIQUE: Contiguous axial images were obtained from the base of the skull through the vertex without intravenous contrast.  COMPARISON:  12/31/2009  FINDINGS: Diffuse age-related atrophy. Diffuse bilateral low attenuation in the deep white matter. No evidence of hemorrhage or extra-axial fluid. No vascular territory infarct. Bilateral basal ganglia calcification. Calvarium is intact.  IMPRESSION: No acute abnormalities   Electronically Signed   By: Esperanza Heir M.D.   On: 06/07/2013 14:11   Ct Chest Wo Contrast  06/07/2013   CLINICAL DATA:  Shortness of breath. Cough and fever. History of tracheal bronchitis and COPD. History of lung nodule.  EXAM: CT CHEST WITHOUT CONTRAST  TECHNIQUE: Multidetector CT imaging of the chest was performed following the standard protocol without IV contrast.  COMPARISON:  Multiple exams, including 06/07/2013 and 06/02/2008  FINDINGS: Right paratracheal node short axis 1.3 cm, formerly 0.7 cm. A low right paratracheal lymph node short axis 1.4 cm, formerly 0.8 cm. Probable hilar adenopathy. Other nodes have also increased in size significantly.  Dense atherosclerosis.  This includes  the coronary arteries.  Consolidation in the right lower lobe with surrounding faint nodularity and marginal ground-glass opacity. Chronic lingular scarring. Faint ground-glass densities and volume loss in the left lower lobe.  Numerous chronic thoracic compression fractures are observed. Suspected emphysema. Old healed bilateral rib fractures anteriorly.  IMPRESSION: 1. Consolidation in the right lower lobe with new pathologic mediastinal adenopathy. Appearance favors bacterial pneumonia.  Followup chest radiography is recommended in 4-6 weeks time to ensure resolution and exclude underlying malignancy. Alternatively CT would provide highly specific followup and allow direct assessment of the adenopathy for resolution. Patchy ground-glass opacities in the right lower lobe and to a lesser extent in the left lower lobe are also likely inflammatory. 2. Mild atelectasis in the left lower lobe. Stable lingular scarring. 3. Atherosclerosis.   Electronically Signed   By: Herbie Baltimore M.D.   On: 06/07/2013 18:42    Microbiology: Recent Results (from the past 240 hour(s))  CULTURE, BLOOD (ROUTINE X 2)     Status: None   Collection Time    06/07/13  1:00 PM      Result Value Range Status   Specimen Description BLOOD RIGHT ARM  5 ML IN [facility] BOTTLE   Final   Special Requests NONE   Final   Culture  Setup Time     Final   Value: 06/07/2013 17:46     Performed at [facility]   Culture     Final   Value: NO GROWTH 5 DAYS     Performed at [facility]   Report Status 06/13/2013 FINAL   Final  CULTURE, BLOOD (ROUTINE X 2)     Status: None   Collection Time    06/07/13  1:45 PM      Result Value Range Status   Specimen Description BLOOD LEFT ARM  8 ML IN [facility] BOTTLE   Final   Special Requests NONE   Final   Culture  Setup Time     Final   Value: 06/07/2013 17:45     Performed at [facility]   Culture     Final   Value: NO GROWTH 5 DAYS     Performed at [facility]   Report Status 06/13/2013 FINAL   Final     Labs: Basic Metabolic Panel:  Recent Labs Lab 06/10/13 0526 06/11/13 0500 06/12/13 1213 06/14/13 0410 06/15/13 1152  NA 133* 132* 130* 133* 133*  K 4.9 5.1 4.6 4.5 4.2  CL 95* 93* 94* 96 94*  CO2 27 24 26 28 28   GLUCOSE 181* 213* 180* 117* 157*  BUN 42* 40* 42* 40* 44*  CREATININE 1.88* 1.71* 1.61* 1.75* 1.81*  CALCIUM 9.3 9.9 9.7 9.8 9.7   Liver Function Tests: No results found for this basename: AST, ALT, ALKPHOS, BILITOT,  PROT, ALBUMIN,  in the last 168 hours No results found for this basename: LIPASE, AMYLASE,  in the last 168 hours No results found for this basename: AMMONIA,  in the last 168 hours CBC:  Recent Labs Lab 06/10/13 0526  WBC 6.4  HGB 9.6*  HCT 29.5*  MCV 87.0  PLT 302   Cardiac Enzymes: No results found for this basename: CKTOTAL, CKMB, CKMBINDEX, TROPONINI,  in the last 168 hours BNP: BNP (last 3 results)  Recent Labs  06/07/13 1300 06/13/13 0406  PROBNP 3554.0* 1503.0*   CBG:  Recent Labs Lab 06/15/13 1223 06/15/13 1735 06/15/13 2135 06/16/13 0729 06/16/13 1145  GLUCAP 156* 220* 191* 154*  156*       Signed:  Anniebelle Devore C  [facility] 06/16/2013, 2:48 PM

## 2013-06-16 NOTE — Progress Notes (Signed)
OT Cancellation Note  Patient Details Name: [name] MRN: [id number] DOB: Jun 21, 1934   Cancelled Treatment:    Reason Eval/Treat Not Completed: Pain limiting ability to participate . Pt states her pain is 7/10 in legs and declines OOB right now. Will try back another time. She states she has had pain meds recently.   [name] [phone] 06/16/2013, 10:28 AM

## 2013-06-16 NOTE — Progress Notes (Signed)
Patient is set to discharge to [facility] today. Patient & husband at bedside aware. Discharge packet in [place]. [facility] called for transport (Service Request Id: [id number]).   Clinical Social Work Department CLINICAL SOCIAL WORK PLACEMENT NOTE 06/16/2013  Patient:  [name], [name]  Account Number:  [id number] Admit date:  06/07/2013  Clinical Social Worker:  Orpah Greek  Date/time:  06/16/2013 02:47 PM  Clinical Social Work is seeking post-discharge placement for this patient at the following level of care:   SKILLED NURSING   (*CSW will update this form in Epic as items are completed)   06/16/2013  Patient/family provided with [facility] Department of Clinical Social Work's list of facilities offering this level of care within the geographic area requested by the patient (or if unable, by the patient's family).  06/16/2013  Patient/family informed of their freedom to choose among providers that offer the needed level of care, that participate in Medicare, Medicaid or managed care program needed by the patient, have an available bed and are willing to accept the patient.  06/16/2013  Patient/family informed of [facility]' ownership interest in [facility], as well as of the fact that they are under no obligation to receive care at this facility.  PASARR submitted to EDS on 06/16/2013 PASARR number received from EDS on 06/16/2013  FL2 transmitted to all facilities in geographic area requested by pt/family on  06/16/2013 FL2 transmitted to all facilities within larger geographic area on   Patient informed that his/her managed care company has contracts with or will negotiate with  certain facilities, including the following:     Patient/family informed of bed offers received:  06/16/2013 Patient chooses bed at [facility] Physician recommends and patient chooses bed at    Patient to be transferred to [facility] on  06/16/2013 Patient to be transferred  to facility by [facility]  The following physician request were entered in Epic:   Additional Comments:   Unice Bailey, LCSW [facility] Clinical Social Worker cell #: [phone]

## 2013-06-17 ENCOUNTER — Non-Acute Institutional Stay (SKILLED_NURSING_FACILITY): Payer: Medicare Other | Admitting: Internal Medicine

## 2013-06-17 DIAGNOSIS — I509 Heart failure, unspecified: Secondary | ICD-10-CM

## 2013-06-17 DIAGNOSIS — I150 Renovascular hypertension: Secondary | ICD-10-CM

## 2013-06-17 DIAGNOSIS — J449 Chronic obstructive pulmonary disease, unspecified: Secondary | ICD-10-CM

## 2013-06-17 DIAGNOSIS — E1129 Type 2 diabetes mellitus with other diabetic kidney complication: Secondary | ICD-10-CM

## 2013-06-29 ENCOUNTER — Ambulatory Visit (INDEPENDENT_AMBULATORY_CARE_PROVIDER_SITE_OTHER): Payer: Medicare Other | Admitting: Interventional Cardiology

## 2013-06-29 ENCOUNTER — Encounter: Payer: Self-pay | Admitting: Interventional Cardiology

## 2013-06-29 VITALS — BP 140/40 | HR 88 | Ht 60.25 in

## 2013-06-29 DIAGNOSIS — I4891 Unspecified atrial fibrillation: Secondary | ICD-10-CM

## 2013-06-29 DIAGNOSIS — I2510 Atherosclerotic heart disease of native coronary artery without angina pectoris: Secondary | ICD-10-CM

## 2013-06-29 MED ORDER — AMIODARONE HCL 200 MG PO TABS: 200.0000 mg | ORAL_TABLET | Freq: Every day | ORAL | Status: DC

## 2013-06-29 NOTE — Progress Notes (Signed)
Patient ID: [name], female   DOB: March 21, 1934, 79 y.o.   MRN: [id number]    [address] [place], [place]  [address] Phone: [phone] Fax:  [phone]  Date:  06/29/2013   ID:  [name], DOB 1933-08-18, MRN [id number]  PCP:  Gweneth Dimitri, MD      History of Present Illness: [name] is a 79 y.o. female with known coronary artery disease and paroxysmal atrial fibrillation. Over the past few months, she has had a few episodes of atrial fibrillation which lasted a few hours. She has had some shortness of breath as well. She feels some mild fluid retention. She has chronic lung disease and requires oxygen. Her left leg is swollen and burning. She had a repeat catheterization after her stents a few years ago and her LAD was widely patent.  She was hospitalized with pneumonia and found to be in atrial fibrillation with rapid ventricular response. Her antiarrhythmic was switched to amiodarone. She had renal dysfunction which may titrating sotalol difficult.    Wt Readings from Last 3 Encounters:  06/16/13 178 lb 9.2 oz (81 kg)  06/04/13 189 lb (85.73 kg)  04/23/13 192 lb 12.8 oz (87.454 kg)     Past Medical History  Diagnosis Date  . CAD (coronary artery disease)   . CHF (congestive heart failure)   . Paroxysmal atrial fibrillation   . Bradycardia   . Hypertension   . Renal artery stenosis   . COPD (chronic obstructive pulmonary disease)   . Obesity hypoventilation syndrome   . Pruritus   . Tracheobronchitis   . Acute rhinosinusitis   . Anemia   . Hypoxemia   . Peripheral edema   . Obesity, endogenous   . Diabetes mellitus   . Acid reflux disease   . Osteoarthritis, generalized   . Hypothyroidism   . Lung nodule     lingula  . OSA (obstructive sleep apnea)     NPSG 04/03/00--AHI 28.hr  . DVT (deep venous thrombosis) 2003    left leg  . Vaginal atrophy   . Hx of colonic polyps   . Acute diastolic heart failure   .  Secondary pulmonary hypertension 06/09/2013    Current Outpatient Prescriptions  Medication Sig Dispense Refill  . clopidogrel (PLAVIX) 75 MG tablet Take 75 mg by mouth daily.        Marland Kitchen albuterol (VENTOLIN HFA) 108 (90 BASE) MCG/ACT inhaler Inhale 2 puffs into the lungs every 4 (four) hours as needed for wheezing or shortness of breath.  1 Inhaler  0  . amiodarone (PACERONE) 400 MG tablet Take 1 tablet (400 mg total) by mouth daily.  30 tablet  0  . amLODipine (NORVASC) 5 MG tablet Take 5 mg by mouth daily.      Marland Kitchen aspirin EC 81 MG EC tablet Take 1 tablet (81 mg total) by mouth daily.  30 tablet  0  . azelastine (ASTELIN) 137 MCG/SPRAY nasal spray Place 2 sprays into the nose daily. Use in each nostril as directed  90 mL  3  . benzonatate (TESSALON) 100 MG capsule Take 1 capsule (100 mg total) by mouth 3 (three) times daily as needed for cough.  30 capsule  0  . budesonide-formoterol (SYMBICORT) 160-4.5 MCG/ACT inhaler Inhale 2 puffs into the lungs 2 (two) times daily.  3 Inhaler  3  . cetirizine (ZYRTEC) 10 MG tablet Take 1 tablet (10 mg total) by mouth daily.  90 tablet  3  . colchicine 0.6 MG tablet Take 0.5 tablets (0.3 mg total) by mouth daily.  30 tablet  0  . cyclobenzaprine (FLEXERIL) 10 MG tablet Once a day       . dextromethorphan-guaiFENesin (MUCINEX DM) 30-600 MG per 12 hr tablet Take 1 tablet by mouth as needed (cough). As needed      . diltiazem (CARDIZEM) 120 MG tablet 2 tablets daily       . feeding supplement, GLUCERNA SHAKE, (GLUCERNA SHAKE) LIQD Take 237 mLs by mouth 2 (two) times daily after a meal.    0  . furosemide (LASIX) 40 MG tablet Take 1 tablet (40 mg total) by mouth 2 (two) times daily.  30 tablet  0  . glimepiride (AMARYL) 4 MG tablet Once a day       . ibandronate (BONIVA) 150 MG tablet Take 150 mg by mouth every 30 (thirty) days. Take in the morning with a full glass of water, on an empty stomach, and do not take anything else by mouth or lie down for the next 30  min.       . insulin glargine (LANTUS) 100 UNIT/ML injection 13 units every morning and 35 units every evening      . isosorbide mononitrate (IMDUR) 30 MG 24 hr tablet Take 30 mg by mouth daily.        Marland Kitchen levothyroxine (SYNTHROID, LEVOTHROID) 50 MCG tablet 1/2 tablet mon, wed, fri and 1 whole tablet the other days       . lisinopril (PRINIVIL,ZESTRIL) 10 MG tablet Take 10 mg by mouth daily.        . montelukast (SINGULAIR) 10 MG tablet Take 1 tablet (10 mg total) by mouth at bedtime.  90 tablet  3  . Multiple Vitamin (MULTIVITAMIN PO) Once a day       . nitroGLYCERIN (NITROSTAT) 0.4 MG SL tablet As needed       . omeprazole (PRILOSEC OTC) 20 MG tablet Take one 3-60 minutes before 1st and last meals of the day       . oxybutynin (DITROPAN XL) 10 MG 24 hr tablet Take 10 mg by mouth daily.        . OxyCODONE (OXYCONTIN) 10 mg T12A 12 hr tablet Take 2 tablets (20 mg total) by mouth every 12 (twelve) hours.  60 tablet  0  . oxyCODONE-acetaminophen (PERCOCET/ROXICET) 5-325 MG per tablet Take 1-2 tablets by mouth every 6 (six) hours as needed for severe pain.  30 tablet  0  . polyethylene glycol (MIRALAX / GLYCOLAX) packet Take 17 g by mouth daily.  14 each  0  . Prenatal Vit-Fe Sulfate-FA (PRENATAL VITAMIN PO) Once a day       . ramelteon (ROZEREM) 8 MG tablet Take 1 tablet (8 mg total) by mouth daily as needed for sleep.  30 tablet  0   No current facility-administered medications for this visit.    Allergies:    Allergies  Allergen Reactions  . Cefuroxime Axetil   . Cephalexin   . Clarithromycin   . Doxycycline     REACTION: rash on legs and feet, edema  . Erythromycin   . Tetracycline     Social History:  The patient  reports that she quit smoking about 34 years ago. She has never used smokeless tobacco. She reports that she does not drink alcohol or use illicit drugs.   Family History:  The patient's family history includes Allergies in her mother; Arthritis in her mother  and sister;  Bone cancer in an other family member; Breast cancer in an other family member; Clotting disorder in her brother; Diabetes in her brother, sister, and another family member; Emphysema in her mother; Heart attack in her father; Heart disease in her mother and sister; Pneumonia in her mother; Stroke in her sister.   ROS:  Please see the history of present illness.   Nausea , no vomiting.  No fevers, chills.  Getting stronger with therapy.  No dysuria.    All other systems reviewed and negative.   PHYSICAL EXAM: VS:  BP 140/40  Pulse 88  Ht 5' 0.25" (1.53 m) Well nourished, well developed, in no acute distress HEENT: normal Neck: no JVD, no carotid bruits Cardiac:  normal S1, S2; RRR;  Lungs:  clear to auscultation bilaterally, no wheezing, rhonchi or rales Abd: soft, nontender, no hepatomegaly Ext: no edema Skin: warm and dry Neuro:   no focal abnormalities noted      ASSESSMENT AND PLAN:  1. AFib: Decrease Amiodarone to 200 mg daily. Hopefully this will help with the nausea. She feels like she is having episodes of atrial fibrillation. Will check life watch monitor.  She is currently in sinus rhythm. She feels like she is "trying to go back in atrial fibrillation." It is unclear, this is anxiety. She is requesting some type of anxiety medication. I Asked her to speak with Dr. Corliss Blacker about this. Hypertension, essential  Used Clonidine HCl Tablet, 0.1 MG, 1 tablet, Orally, Twice a day as needed in the past Notes: prn CLonidine seems to help BP in addition to other scheduled meds. Renal artery stenosis in the past. Patient Educated with: [facility] BP Action plan.pdf ([facility] BP Action plan.pdf) Patient Educated with: Hypertension ExitCare.pdf (Hypertension ExitCare.pdf).   3. Coronary atherosclerosis of native coronary artery  Notes: No angina. Nonsignificant RCA disease by FFR in 2012. COuld hold plavix for 5 days if back surgery needed since stents are nearly 79 years old.    Signed, Fredric Mare, MD, [facility] 06/29/2013 1:40 PM

## 2013-06-29 NOTE — Patient Instructions (Signed)
Your physician has recommended that you wear an event monitor. Event monitors are medical devices that record the heart's electrical activity. Doctors most often [name] these monitors to diagnose arrhythmias. Arrhythmias are problems with the speed or rhythm of the heartbeat. The monitor is a small, portable device. You can wear one while you do your normal daily activities. This is usually used to diagnose what is causing palpitations/syncope (passing out).  Your physician has recommended you make the following change in your medication:   1. Decrease Amiodarone to 200mg  daily.  Your physician wants you to follow-up in: 4 months with Dr. Eldridge Dace. You will receive a reminder letter in the mail two months in advance. If you don't receive a letter, please call our office to schedule the follow-up appointment.

## 2013-07-06 ENCOUNTER — Other Ambulatory Visit: Payer: Self-pay | Admitting: *Deleted

## 2013-07-09 ENCOUNTER — Other Ambulatory Visit: Payer: Self-pay

## 2013-07-09 ENCOUNTER — Inpatient Hospital Stay (HOSPITAL_COMMUNITY): Payer: Medicare Other

## 2013-07-09 ENCOUNTER — Encounter (HOSPITAL_COMMUNITY): Payer: Self-pay | Admitting: Emergency Medicine

## 2013-07-09 ENCOUNTER — Inpatient Hospital Stay (HOSPITAL_COMMUNITY)
Admission: EM | Admit: 2013-07-09 | Discharge: 2013-07-15 | DRG: 682 | Disposition: A | Discharge status: Skilled Nursing Facility | Payer: Medicare Other | Attending: Internal Medicine | Admitting: Internal Medicine

## 2013-07-09 ENCOUNTER — Emergency Department (HOSPITAL_COMMUNITY): Payer: Medicare Other

## 2013-07-09 DIAGNOSIS — J45901 Unspecified asthma with (acute) exacerbation: Secondary | ICD-10-CM

## 2013-07-09 DIAGNOSIS — N170 Acute kidney failure with tubular necrosis: Principal | ICD-10-CM | POA: Diagnosis present

## 2013-07-09 DIAGNOSIS — I129 Hypertensive chronic kidney disease with stage 1 through stage 4 chronic kidney disease, or unspecified chronic kidney disease: Secondary | ICD-10-CM | POA: Diagnosis present

## 2013-07-09 DIAGNOSIS — I4891 Unspecified atrial fibrillation: Secondary | ICD-10-CM

## 2013-07-09 DIAGNOSIS — Z87891 Personal history of nicotine dependence: Secondary | ICD-10-CM

## 2013-07-09 DIAGNOSIS — J449 Chronic obstructive pulmonary disease, unspecified: Secondary | ICD-10-CM | POA: Diagnosis present

## 2013-07-09 DIAGNOSIS — Z8701 Personal history of pneumonia (recurrent): Secondary | ICD-10-CM

## 2013-07-09 DIAGNOSIS — E43 Unspecified severe protein-calorie malnutrition: Secondary | ICD-10-CM | POA: Diagnosis present

## 2013-07-09 DIAGNOSIS — E872 Acidosis, unspecified: Secondary | ICD-10-CM

## 2013-07-09 DIAGNOSIS — IMO0002 Reserved for concepts with insufficient information to code with codable children: Secondary | ICD-10-CM | POA: Diagnosis present

## 2013-07-09 DIAGNOSIS — E039 Hypothyroidism, unspecified: Secondary | ICD-10-CM | POA: Diagnosis present

## 2013-07-09 DIAGNOSIS — R197 Diarrhea, unspecified: Secondary | ICD-10-CM

## 2013-07-09 DIAGNOSIS — I5033 Acute on chronic diastolic (congestive) heart failure: Secondary | ICD-10-CM

## 2013-07-09 DIAGNOSIS — Z86718 Personal history of other venous thrombosis and embolism: Secondary | ICD-10-CM

## 2013-07-09 DIAGNOSIS — M109 Gout, unspecified: Secondary | ICD-10-CM | POA: Diagnosis present

## 2013-07-09 DIAGNOSIS — J4489 Other specified chronic obstructive pulmonary disease: Secondary | ICD-10-CM

## 2013-07-09 DIAGNOSIS — D649 Anemia, unspecified: Secondary | ICD-10-CM | POA: Diagnosis present

## 2013-07-09 DIAGNOSIS — I959 Hypotension, unspecified: Secondary | ICD-10-CM

## 2013-07-09 DIAGNOSIS — Z794 Long term (current) use of insulin: Secondary | ICD-10-CM

## 2013-07-09 DIAGNOSIS — G4733 Obstructive sleep apnea (adult) (pediatric): Secondary | ICD-10-CM

## 2013-07-09 DIAGNOSIS — Z8249 Family history of ischemic heart disease and other diseases of the circulatory system: Secondary | ICD-10-CM

## 2013-07-09 DIAGNOSIS — Z823 Family history of stroke: Secondary | ICD-10-CM

## 2013-07-09 DIAGNOSIS — K219 Gastro-esophageal reflux disease without esophagitis: Secondary | ICD-10-CM | POA: Diagnosis present

## 2013-07-09 DIAGNOSIS — Z803 Family history of malignant neoplasm of breast: Secondary | ICD-10-CM

## 2013-07-09 DIAGNOSIS — N183 Chronic kidney disease, stage 3 unspecified: Secondary | ICD-10-CM | POA: Diagnosis present

## 2013-07-09 DIAGNOSIS — E119 Type 2 diabetes mellitus without complications: Secondary | ICD-10-CM

## 2013-07-09 DIAGNOSIS — Z79899 Other long term (current) drug therapy: Secondary | ICD-10-CM

## 2013-07-09 DIAGNOSIS — R112 Nausea with vomiting, unspecified: Secondary | ICD-10-CM

## 2013-07-09 DIAGNOSIS — Z87440 Personal history of urinary (tract) infections: Secondary | ICD-10-CM

## 2013-07-09 DIAGNOSIS — I701 Atherosclerosis of renal artery: Secondary | ICD-10-CM | POA: Diagnosis present

## 2013-07-09 DIAGNOSIS — I5030 Unspecified diastolic (congestive) heart failure: Secondary | ICD-10-CM | POA: Diagnosis present

## 2013-07-09 DIAGNOSIS — Z833 Family history of diabetes mellitus: Secondary | ICD-10-CM

## 2013-07-09 DIAGNOSIS — E662 Morbid (severe) obesity with alveolar hypoventilation: Secondary | ICD-10-CM | POA: Diagnosis present

## 2013-07-09 DIAGNOSIS — N952 Postmenopausal atrophic vaginitis: Secondary | ICD-10-CM | POA: Diagnosis present

## 2013-07-09 DIAGNOSIS — I2789 Other specified pulmonary heart diseases: Secondary | ICD-10-CM | POA: Diagnosis present

## 2013-07-09 DIAGNOSIS — I10 Essential (primary) hypertension: Secondary | ICD-10-CM

## 2013-07-09 DIAGNOSIS — Z9981 Dependence on supplemental oxygen: Secondary | ICD-10-CM

## 2013-07-09 DIAGNOSIS — T462X5A Adverse effect of other antidysrhythmic drugs, initial encounter: Secondary | ICD-10-CM | POA: Diagnosis present

## 2013-07-09 DIAGNOSIS — N179 Acute kidney failure, unspecified: Secondary | ICD-10-CM

## 2013-07-09 DIAGNOSIS — I509 Heart failure, unspecified: Secondary | ICD-10-CM

## 2013-07-09 DIAGNOSIS — I2510 Atherosclerotic heart disease of native coronary artery without angina pectoris: Secondary | ICD-10-CM

## 2013-07-09 DIAGNOSIS — E1165 Type 2 diabetes mellitus with hyperglycemia: Secondary | ICD-10-CM | POA: Diagnosis present

## 2013-07-09 DIAGNOSIS — Z808 Family history of malignant neoplasm of other organs or systems: Secondary | ICD-10-CM

## 2013-07-09 DIAGNOSIS — N19 Unspecified kidney failure: Secondary | ICD-10-CM | POA: Insufficient documentation

## 2013-07-09 DIAGNOSIS — J441 Chronic obstructive pulmonary disease with (acute) exacerbation: Secondary | ICD-10-CM

## 2013-07-09 DIAGNOSIS — R1115 Cyclical vomiting syndrome unrelated to migraine: Secondary | ICD-10-CM | POA: Diagnosis present

## 2013-07-09 LAB — COMPREHENSIVE METABOLIC PANEL
ALT: 33 U/L (ref 0–35)
Albumin: 3.1 g/dL — ABNORMAL LOW (ref 3.5–5.2)
Alkaline Phosphatase: 150 U/L — ABNORMAL HIGH (ref 39–117)
Calcium: 7.6 mg/dL — ABNORMAL LOW (ref 8.4–10.5)
GFR calc Af Amer: 5 mL/min — ABNORMAL LOW (ref >90)
Glucose, Bld: 112 mg/dL — ABNORMAL HIGH (ref 70–99)
Potassium: 4.6 mEq/L (ref 3.5–5.1)
Sodium: 128 mEq/L — ABNORMAL LOW (ref 135–145)
Total Protein: 7.2 g/dL (ref 6.0–8.3)

## 2013-07-09 LAB — CBC WITH DIFFERENTIAL/PLATELET
Basophils Absolute: 0 10*3/uL (ref 0.0–0.1)
Basophils Relative: 0 % (ref 0–1)
Eosinophils Absolute: 0.3 10*3/uL (ref 0.0–0.7)
Eosinophils Relative: 3 % (ref 0–5)
Hemoglobin: 11.6 g/dL — ABNORMAL LOW (ref 12.0–15.0)
MCH: 29.4 pg (ref 26.0–34.0)
MCHC: 34.6 g/dL (ref 30.0–36.0)
MCV: 84.8 fL (ref 78.0–100.0)
Neutrophils Relative %: 79 % — ABNORMAL HIGH (ref 43–77)
Platelets: 313 10*3/uL (ref 150–400)
RDW: 18.7 % — ABNORMAL HIGH (ref 11.5–15.5)

## 2013-07-09 LAB — CLOSTRIDIUM DIFFICILE BY PCR: Toxigenic C. Difficile by PCR: NEGATIVE

## 2013-07-09 LAB — POCT I-STAT 3, ART BLOOD GAS (G3+)
Acid-base deficit: 19 mmol/L — ABNORMAL HIGH (ref 0.0–2.0)
Bicarbonate: 7.7 mEq/L — ABNORMAL LOW (ref 20.0–24.0)
O2 Saturation: 99 %
Patient temperature: 98.6
TCO2: 8 mmol/L (ref 0–100)
pCO2 arterial: 21.4 mmHg — ABNORMAL LOW (ref 35.0–45.0)

## 2013-07-09 LAB — POCT I-STAT TROPONIN I: Troponin i, poc: 0.04 ng/mL (ref 0.00–0.08)

## 2013-07-09 LAB — URINE MICROSCOPIC-ADD ON

## 2013-07-09 LAB — CBC
MCHC: 34 g/dL (ref 30.0–36.0)
MCV: 87.1 fL (ref 78.0–100.0)
Platelets: 295 10*3/uL (ref 150–400)
RBC: 3.48 MIL/uL — ABNORMAL LOW (ref 3.87–5.11)
WBC: 10.1 10*3/uL (ref 4.0–10.5)

## 2013-07-09 LAB — URINALYSIS, ROUTINE W REFLEX MICROSCOPIC
Glucose, UA: NEGATIVE mg/dL
Hgb urine dipstick: NEGATIVE
Ketones, ur: 15 mg/dL — AB
Nitrite: NEGATIVE
Specific Gravity, Urine: 1.019 (ref 1.005–1.030)
pH: 5 (ref 5.0–8.0)

## 2013-07-09 LAB — CG4 I-STAT (LACTIC ACID): Lactic Acid, Venous: 0.88 mmol/L (ref 0.5–2.2)

## 2013-07-09 LAB — CREATININE, URINE, RANDOM: Creatinine, Urine: 147 mg/dL

## 2013-07-09 LAB — SODIUM, URINE, RANDOM: Sodium, Ur: 15 mEq/L

## 2013-07-09 LAB — CK: Total CK: 57 U/L (ref 7–177)

## 2013-07-09 LAB — GLUCOSE, CAPILLARY: Glucose-Capillary: 137 mg/dL — ABNORMAL HIGH (ref 70–99)

## 2013-07-09 LAB — CREATININE, SERUM: GFR calc Af Amer: 6 mL/min — ABNORMAL LOW (ref >90)

## 2013-07-09 LAB — TROPONIN I: Troponin I: <0.3 ng/mL (ref <0.30)

## 2013-07-09 MED ORDER — ACETAMINOPHEN 650 MG RE SUPP: 650.0000 mg | Freq: Four times a day (QID) | RECTAL | Status: DC | PRN

## 2013-07-09 MED ORDER — INSULIN ASPART 100 UNIT/ML ~~LOC~~ SOLN
0.0000 [IU] | Freq: Three times a day (TID) | SUBCUTANEOUS | Status: DC
  Administered 2013-07-11: 2 [IU] via SUBCUTANEOUS
  Administered 2013-07-11 – 2013-07-14 (×7): 1 [IU] via SUBCUTANEOUS
  Administered 2013-07-14: 12:00:00 2 [IU] via SUBCUTANEOUS
  Administered 2013-07-15 (×2): 1 [IU] via SUBCUTANEOUS

## 2013-07-09 MED ORDER — LEVOTHYROXINE SODIUM 50 MCG PO TABS
50.0000 ug | ORAL_TABLET | ORAL | Status: DC
  Administered 2013-07-11 – 2013-07-14 (×2): 50 ug via ORAL
  Filled 2013-07-09 (×4): qty 1

## 2013-07-09 MED ORDER — SODIUM BICARBONATE 8.4 % IV SOLN: INTRAVENOUS | Status: DC

## 2013-07-09 MED ORDER — LEVOTHYROXINE SODIUM 50 MCG PO TABS: 50.0000 ug | ORAL_TABLET | ORAL | Status: DC

## 2013-07-09 MED ORDER — SODIUM BICARBONATE 8.4 % IV SOLN
INTRAVENOUS | Status: DC
  Administered 2013-07-09 – 2013-07-11 (×4): via INTRAVENOUS
  Filled 2013-07-09 (×7): qty 150

## 2013-07-09 MED ORDER — CLOPIDOGREL BISULFATE 75 MG PO TABS
75.0000 mg | ORAL_TABLET | Freq: Every day | ORAL | Status: DC
  Administered 2013-07-10 – 2013-07-15 (×6): 75 mg via ORAL
  Filled 2013-07-09 (×6): qty 1

## 2013-07-09 MED ORDER — LEVOTHYROXINE SODIUM 25 MCG PO TABS: 25.0000 ug | ORAL_TABLET | ORAL | Status: DC

## 2013-07-09 MED ORDER — OXYCODONE HCL ER 10 MG PO T12A
20.0000 mg | EXTENDED_RELEASE_TABLET | Freq: Two times a day (BID) | ORAL | Status: DC
  Administered 2013-07-09 – 2013-07-12 (×5): 20 mg via ORAL
  Filled 2013-07-09 (×5): qty 2

## 2013-07-09 MED ORDER — ALBUTEROL SULFATE HFA 108 (90 BASE) MCG/ACT IN AERS
2.0000 | INHALATION_SPRAY | RESPIRATORY_TRACT | Status: DC | PRN
  Filled 2013-07-09: qty 6.7

## 2013-07-09 MED ORDER — DICYCLOMINE HCL 10 MG PO CAPS
10.0000 mg | ORAL_CAPSULE | Freq: Once | ORAL | Status: AC
  Administered 2013-07-09: 10 mg via ORAL
  Filled 2013-07-09: qty 1

## 2013-07-09 MED ORDER — SODIUM CHLORIDE 0.9 % IV SOLN
INTRAVENOUS | Status: AC
  Administered 2013-07-09: 23:00:00 via INTRAVENOUS

## 2013-07-09 MED ORDER — SODIUM CHLORIDE 0.9 % IJ SOLN
3.0000 mL | Freq: Two times a day (BID) | INTRAMUSCULAR | Status: DC
  Administered 2013-07-09 – 2013-07-15 (×9): 3 mL via INTRAVENOUS

## 2013-07-09 MED ORDER — ONDANSETRON HCL 4 MG/2ML IJ SOLN
4.0000 mg | Freq: Four times a day (QID) | INTRAMUSCULAR | Status: DC | PRN
  Administered 2013-07-10 – 2013-07-11 (×4): 4 mg via INTRAVENOUS
  Filled 2013-07-09 (×6): qty 2

## 2013-07-09 MED ORDER — DILTIAZEM HCL 60 MG PO TABS
240.0000 mg | ORAL_TABLET | Freq: Every day | ORAL | Status: DC
  Administered 2013-07-10: 10:00:00 240 mg via ORAL
  Filled 2013-07-09: qty 1

## 2013-07-09 MED ORDER — RAMELTEON 8 MG PO TABS
8.0000 mg | ORAL_TABLET | Freq: Every day | ORAL | Status: DC
  Administered 2013-07-09 – 2013-07-14 (×6): 8 mg via ORAL
  Filled 2013-07-09 (×7): qty 1

## 2013-07-09 MED ORDER — ONDANSETRON HCL 4 MG PO TABS
4.0000 mg | ORAL_TABLET | Freq: Four times a day (QID) | ORAL | Status: DC | PRN
  Administered 2013-07-11: 4 mg via ORAL

## 2013-07-09 MED ORDER — METRONIDAZOLE IN NACL 5-0.79 MG/ML-% IV SOLN
500.0000 mg | Freq: Three times a day (TID) | INTRAVENOUS | Status: DC
  Administered 2013-07-09 – 2013-07-10 (×2): 500 mg via INTRAVENOUS
  Filled 2013-07-09 (×3): qty 100

## 2013-07-09 MED ORDER — AMIODARONE HCL 200 MG PO TABS
200.0000 mg | ORAL_TABLET | Freq: Every day | ORAL | Status: DC
  Administered 2013-07-10 – 2013-07-11 (×2): 200 mg via ORAL
  Filled 2013-07-09 (×3): qty 1

## 2013-07-09 MED ORDER — MORPHINE SULFATE 2 MG/ML IJ SOLN
2.0000 mg | Freq: Once | INTRAMUSCULAR | Status: AC
  Administered 2013-07-09: 2 mg via INTRAVENOUS
  Filled 2013-07-09: qty 1

## 2013-07-09 MED ORDER — LORAZEPAM 0.5 MG PO TABS
0.2500 mg | ORAL_TABLET | Freq: Three times a day (TID) | ORAL | Status: DC
  Administered 2013-07-09 – 2013-07-12 (×8): 0.25 mg via ORAL
  Filled 2013-07-09 (×9): qty 1

## 2013-07-09 MED ORDER — SODIUM CHLORIDE 0.9 % IV BOLUS (SEPSIS)
1000.0000 mL | Freq: Once | INTRAVENOUS | Status: AC
  Administered 2013-07-09: 1000 mL via INTRAVENOUS

## 2013-07-09 MED ORDER — BUDESONIDE-FORMOTEROL FUMARATE 160-4.5 MCG/ACT IN AERO
2.0000 | INHALATION_SPRAY | Freq: Two times a day (BID) | RESPIRATORY_TRACT | Status: DC
  Administered 2013-07-11 – 2013-07-15 (×8): 2 via RESPIRATORY_TRACT
  Filled 2013-07-09: qty 6

## 2013-07-09 MED ORDER — NITROGLYCERIN 0.4 MG SL SUBL: 0.4000 mg | SUBLINGUAL_TABLET | SUBLINGUAL | Status: DC | PRN

## 2013-07-09 MED ORDER — ONDANSETRON HCL 4 MG/2ML IJ SOLN
4.0000 mg | Freq: Once | INTRAMUSCULAR | Status: AC
  Administered 2013-07-09: 4 mg via INTRAVENOUS
  Filled 2013-07-09: qty 2

## 2013-07-09 MED ORDER — ACETAMINOPHEN 325 MG PO TABS
650.0000 mg | ORAL_TABLET | Freq: Four times a day (QID) | ORAL | Status: DC | PRN
  Administered 2013-07-12 – 2013-07-14 (×3): 650 mg via ORAL
  Filled 2013-07-09 (×3): qty 2

## 2013-07-09 MED ORDER — MORPHINE SULFATE 2 MG/ML IJ SOLN
1.0000 mg | INTRAMUSCULAR | Status: DC | PRN
  Administered 2013-07-09 – 2013-07-14 (×5): 1 mg via INTRAVENOUS
  Filled 2013-07-09 (×5): qty 1

## 2013-07-09 MED ORDER — LEVOTHYROXINE SODIUM 25 MCG PO TABS
25.0000 ug | ORAL_TABLET | ORAL | Status: DC
  Administered 2013-07-10 – 2013-07-15 (×3): 25 ug via ORAL
  Filled 2013-07-09 (×3): qty 1

## 2013-07-09 MED ORDER — HEPARIN SODIUM (PORCINE) 5000 UNIT/ML IJ SOLN
5000.0000 [IU] | Freq: Three times a day (TID) | INTRAMUSCULAR | Status: DC
  Administered 2013-07-09 – 2013-07-15 (×17): 5000 [IU] via SUBCUTANEOUS
  Filled 2013-07-09 (×20): qty 1

## 2013-07-09 MED ORDER — AZELASTINE HCL 0.1 % NA SOLN
2.0000 | Freq: Every day | NASAL | Status: DC
  Administered 2013-07-10 – 2013-07-15 (×6): 2 via NASAL
  Filled 2013-07-09 (×2): qty 30

## 2013-07-09 NOTE — ED Notes (Signed)
Presents with recent post hospitalization Diarrhea, nausea and dehydration. From home., just left rehab facility. Reports weakness and diarrhea for "a long time" dry mucosa, tachycardia, hypotensive. Uses 2 L of 02 at all times.

## 2013-07-09 NOTE — ED Provider Notes (Addendum)
CSN: [id number]     Arrival date & time 07/09/13  1525 History   First MD Initiated Contact with Patient 07/09/13 1526     No chief complaint on file.  (Consider location/radiation/quality/duration/timing/severity/associated sxs/prior Treatment) HPI Comments: Today pt was so weak and dehydrated she had no energy to do anything and having a hard time functioning at home.  Also recently started macrobid 4 days ago for UTI.  Patient is a 79 y.o. female presenting with diarrhea. The history is provided by the patient and a relative.  Diarrhea Quality:  Watery Severity:  Moderate Onset quality:  Gradual Number of episodes:  <5 per day Duration:  3 weeks Timing:  Constant Progression:  Unchanged Relieved by:  Nothing Worsened by:  Change in diet Associated symptoms: vomiting   Associated symptoms: no abdominal pain, no recent cough, no fever and no URI   Associated symptoms comment:  Pt is nauseated and vomiting several times a day for the last 3-4 days when she tries to eat anything.  REcently d/ced from rehab on Monday after 21days in the hospital for CAP.  Pt has been on abx for over a month Risk factors: recent antibiotic use   Risk factors: no suspicious food intake and no travel to endemic areas     Past Medical History  Diagnosis Date  . CAD (coronary artery disease)   . CHF (congestive heart failure)   . Paroxysmal atrial fibrillation   . Bradycardia   . Hypertension   . Renal artery stenosis   . COPD (chronic obstructive pulmonary disease)   . Obesity hypoventilation syndrome   . Pruritus   . Tracheobronchitis   . Acute rhinosinusitis   . Anemia   . Hypoxemia   . Peripheral edema   . Obesity, endogenous   . Diabetes mellitus   . Acid reflux disease   . Osteoarthritis, generalized   . Hypothyroidism   . Lung nodule     lingula  . OSA (obstructive sleep apnea)     NPSG 04/03/00--AHI 28.hr  . DVT (deep venous thrombosis) 2003    left leg  . Vaginal atrophy   . Hx  of colonic polyps   . Acute diastolic heart failure   . Secondary pulmonary hypertension 06/09/2013   Past Surgical History  Procedure Laterality Date  . Lumbar spine surgery    . Cholecystectomy    . Hemorroidectomy    . Repair wound fistula    . Right shoulder    . Elbow surgery    . Dilation and curettage of uterus    . Breast surgery  1998    lumpectomy  . Oophorectomy  1959    BSO-? ovarian cancer   Family History  Problem Relation Age of Onset  . Pneumonia Mother   . Heart disease Mother   . Allergies Mother   . Emphysema Mother   . Arthritis Mother   . Heart attack Father   . Stroke Sister   . Diabetes Sister   . Heart disease Sister   . Diabetes Brother   . Diabetes      grandfather  . Clotting disorder Brother   . Arthritis Sister   . Breast cancer      aunt  . Bone cancer      aunt   History  Substance Use Topics  . Smoking status: Former Smoker    Quit date: 08/13/1978  . Smokeless tobacco: Never Used  . Alcohol Use: No   OB History  Grav Para Term Preterm Abortions TAB SAB Ect Mult Living   0              Obstetric Comments   2 adopted children     Review of Systems  Constitutional: Positive for fatigue. Negative for fever.  Respiratory: Negative for cough and shortness of breath.   Gastrointestinal: Positive for nausea, vomiting and diarrhea. Negative for abdominal pain and blood in stool.  Genitourinary: Negative for dysuria.  Neurological: Positive for weakness.  All other systems reviewed and are negative.    Allergies  Cefuroxime axetil; Cephalexin; Clarithromycin; Doxycycline; Erythromycin; and Tetracycline  Home Medications   Current Outpatient Rx  Name  Route  Sig  Dispense  Refill  . albuterol (VENTOLIN HFA) 108 (90 BASE) MCG/ACT inhaler   Inhalation   Inhale 2 puffs into the lungs every 4 (four) hours as needed for wheezing or shortness of breath.   1 Inhaler   0   . amiodarone (PACERONE) 200 MG tablet   Oral   Take  1 tablet (200 mg total) by mouth daily.   30 tablet   6   . amLODipine (NORVASC) 5 MG tablet   Oral   Take 5 mg by mouth daily.         Marland Kitchen aspirin EC 81 MG EC tablet   Oral   Take 1 tablet (81 mg total) by mouth daily.   30 tablet   0   . azelastine (ASTELIN) 137 MCG/SPRAY nasal spray   Nasal   Place 2 sprays into the nose daily. Use in each nostril as directed   90 mL   3   . benzonatate (TESSALON) 100 MG capsule   Oral   Take 1 capsule (100 mg total) by mouth 3 (three) times daily as needed for cough.   30 capsule   0   . budesonide-formoterol (SYMBICORT) 160-4.5 MCG/ACT inhaler   Inhalation   Inhale 2 puffs into the lungs 2 (two) times daily.   3 Inhaler   3   . cetirizine (ZYRTEC) 10 MG tablet   Oral   Take 1 tablet (10 mg total) by mouth daily.   90 tablet   3   . clopidogrel (PLAVIX) 75 MG tablet   Oral   Take 75 mg by mouth daily.           . colchicine 0.6 MG tablet   Oral   Take 0.6 mg by mouth 2 (two) times daily. Am and pm         . cyclobenzaprine (FLEXERIL) 10 MG tablet      Once a day          . dextromethorphan-guaiFENesin (MUCINEX DM) 30-600 MG per 12 hr tablet   Oral   Take 1 tablet by mouth as needed (cough). As needed         . diltiazem (CARDIZEM) 120 MG tablet      2 tablets daily          . feeding supplement, GLUCERNA SHAKE, (GLUCERNA SHAKE) LIQD   Oral   Take 237 mLs by mouth 2 (two) times daily after a meal.      0   . furosemide (LASIX) 40 MG tablet   Oral   Take 1 tablet (40 mg total) by mouth 2 (two) times daily.   30 tablet   0   . glimepiride (AMARYL) 4 MG tablet      Once a day          .  ibandronate (BONIVA) 150 MG tablet   Oral   Take 150 mg by mouth every 30 (thirty) days. Take in the morning with a full glass of water, on an empty stomach, and do not take anything else by mouth or lie down for the next 30 min.          . insulin glargine (LANTUS) 100 UNIT/ML injection      13 units every  morning and 35 units every evening         . isosorbide mononitrate (IMDUR) 30 MG 24 hr tablet   Oral   Take 30 mg by mouth daily.           Marland Kitchen levothyroxine (SYNTHROID, LEVOTHROID) 50 MCG tablet      Tues thurs Sat and Sat         . lisinopril (PRINIVIL,ZESTRIL) 10 MG tablet   Oral   Take 10 mg by mouth daily.           . montelukast (SINGULAIR) 10 MG tablet   Oral   Take 1 tablet (10 mg total) by mouth at bedtime.   90 tablet   3   . Multiple Vitamin (MULTIVITAMIN PO)      Once a day          . nitroGLYCERIN (NITROSTAT) 0.4 MG SL tablet      As needed          . omeprazole (PRILOSEC OTC) 20 MG tablet      Take one 3-60 minutes before 1st and last meals of the day          . oxybutynin (DITROPAN XL) 10 MG 24 hr tablet   Oral   Take 10 mg by mouth daily.           . OxyCODONE (OXYCONTIN) 10 mg T12A 12 hr tablet   Oral   Take 2 tablets (20 mg total) by mouth every 12 (twelve) hours.   60 tablet   0   . oxyCODONE-acetaminophen (PERCOCET/ROXICET) 5-325 MG per tablet   Oral   Take 1-2 tablets by mouth every 6 (six) hours as needed for severe pain.   30 tablet   0   . polyethylene glycol (MIRALAX / GLYCOLAX) packet   Oral   Take 17 g by mouth daily.   14 each   0   . Prenatal Vit-Fe Sulfate-FA (PRENATAL VITAMIN PO)      Once a day          . ramelteon (ROZEREM) 8 MG tablet   Oral   Take 1 tablet (8 mg total) by mouth daily as needed for sleep.   30 tablet   0    There were no vitals taken for this visit. Physical Exam  Nursing note and vitals reviewed. Constitutional: She is oriented to person, place, and time. She appears well-developed and well-nourished. No distress.  HENT:  Head: Normocephalic and atraumatic.  Mouth/Throat: Oropharynx is clear and moist. Mucous membranes are dry.  Eyes: Conjunctivae and EOM are normal. Pupils are equal, round, and reactive to light.  Neck: Normal range of motion. Neck supple.  Cardiovascular:  Normal rate and intact distal pulses.  An irregularly irregular rhythm present.  Murmur heard.  Systolic murmur is present with a grade of 2/6  Pulmonary/Chest: Effort normal and breath sounds normal. No respiratory distress. She has no wheezes. She has no rales.  Abdominal: Soft. She exhibits no distension. There is no tenderness. There is no rebound and no  guarding.  Musculoskeletal: Normal range of motion. She exhibits no edema and no tenderness.  Neurological: She is alert and oriented to person, place, and time.  Skin: Skin is warm and dry. No rash noted. No erythema.  Psychiatric: She has a normal mood and affect. Her behavior is normal.    ED Course  Procedures (including critical care time) Labs Review Labs Reviewed  CBC WITH DIFFERENTIAL - Abnormal; Notable for the following:    Hemoglobin 11.6 (*)    HCT 33.5 (*)    RDW 18.7 (*)    Neutrophils Relative % 79 (*)    All other components within normal limits  COMPREHENSIVE METABOLIC PANEL - Abnormal; Notable for the following:    Sodium 128 (*)    Chloride 90 (*)    CO2 10 (*)    Glucose, Bld 112 (*)    BUN 116 (*)    Creatinine, Ser 7.66 (*)    Calcium 7.6 (*)    Albumin 3.1 (*)    Alkaline Phosphatase 150 (*)    Total Bilirubin 0.2 (*)    GFR calc non Af Amer 5 (*)    GFR calc Af Amer 5 (*)    All other components within normal limits  URINALYSIS, ROUTINE W REFLEX MICROSCOPIC - Abnormal; Notable for the following:    Color, Urine AMBER (*)    APPearance CLOUDY (*)    Bilirubin Urine MODERATE (*)    Ketones, ur 15 (*)    Protein, ur 30 (*)    Leukocytes, UA TRACE (*)    All other components within normal limits  URINE MICROSCOPIC-ADD ON - Abnormal; Notable for the following:    Casts GRANULAR CAST (*)    All other components within normal limits  CLOSTRIDIUM DIFFICILE BY PCR  GI PATHOGEN PANEL BY PCR, STOOL  POCT I-STAT TROPONIN I  CG4 I-STAT (LACTIC ACID)   Imaging Review Dg Chest 2 View  07/09/2013    CLINICAL DATA:  Abdominal pain with nausea and weakness, recent history of pneumonia  EXAM: CHEST  2 VIEW  COMPARISON:  CT scan of the chest of June 07, 2013.  FINDINGS: The lungs are well-expanded. The posterior right lower lobe infiltrate demonstrated on the previous CT scan is not evident on this chest x-ray. The cardiac silhouette is top-normal in size. The pulmonary vascularity is not engorged. There is no pleural effusion. There is no pneumothorax. There is mild loss of height of multiple mid and lower thoracic vertebral bodies without evidence of high-grade which compression. The observed portions of the upper abdomen reveal no definite abnormal gas collection.  IMPRESSION: 1. There is no evidence of residual pneumonia in the right lower lobe. 2. There is borderline enlargement of cardiac silhouette without overt evidence of CHF.   Electronically Signed   By: David  [place]   On: 07/09/2013 16:49    EKG Interpretation    Date/Time:  Thursday July 09 2013 15:42:37 EST Ventricular Rate:  107 PR Interval:    QRS Duration: 111 QT Interval:  376 QTC Calculation: 502 R Axis:   61 Text Interpretation:  recurrent Atrial fibrillation Probable inferior infarct, age indeterminate Prolonged QT interval Reconfirmed by Anitra Lauth  MD, Nekesha Font ([id number]) on 07/09/2013 4:33:00 PM            MDM   1. Renal failure   2. Diarrhea   3. Metabolic acidosis     Patient presents with a history of recent hospitalization for 2-3 weeks after pneumonia and then  rehabilitation placement. Patient has been on antibiotics for approximately one month and for the last 3 weeks has had persistent diarrhea, decreased appetite and vomiting. Patient went home from rehabilitation 4 days ago and since that time has not been able to eat or drink and has felt very weak.  She's been taking her medications as prescribed and recently started Macrobid 4 days ago for a potential urinary tract infection. She denies any specific  abdominal pain and no blood in her stool.  She states the rehabilitation facility checked her stool samples but did not have any results back at this time. Patient has progressively felt weaker over the last few days is unable to eat or drink. On exam patient is dehydrated with extremely dry mouth poor skin turgor and hypotension in the 90s systolic by EMS that dropped to the 80s with an increased heart rate orthostatics.  Patient given IV fluids and Zofran. Stool samples pending until patient has a bowel movement including C. difficile PCR. CBC, CMP, UA, troponin, lactate, EKG, chest x-ray pending  6:07 PM Cdiff neg.  CBC wnl.  CMP shows new acute on chronic renal failure.  Pt is c/o of some retention sx so will place foley to ensure no obstruction but pt also on lasix and lisinopril and macrobid   Gwyneth Sprout, MD 07/09/13 1811  Gwyneth Sprout, MD 07/09/13 2130

## 2013-07-09 NOTE — ED Notes (Signed)
PHYSICIAN NOTIFIED OF PATIENTS LAB RESULTS OF CG4 LACTIC ACID = 0.34mmoI/L, @16 ;10PM, 07/09/2013.

## 2013-07-09 NOTE — H&P (Signed)
[facility] History and Physical  [name] ZOX:[id number] DOB: [birth year] DOA: 07/09/2013  Referring physician: ER physician. PCP: Gweneth Dimitri, MD   Chief Complaint: Weakness nausea and vomiting diarrhea.  HPI: [name] is a 79 y.o. female who was recently admitted last month for CHF/pneumonia and discharged to nursing home has been having persistent nausea vomiting and diarrhea over the last 3 weeks. Last few days patient has been become very weak and was brought to the ER. Initially patient was found to be hypotensive and blood pressure improved with 1 L normal saline bolus. Patient's labs show elevated creatinine of around 7 with metabolic acidosis which is acute with creatinine of 1.8 last month. Patient also was found to have UTI in the nursing home and was placed on Macrobid few days ago. Patient has been having recurrent nausea vomiting and diarrhea with abdominal cramps over the last 3 weeks. Denies any fever chills chest pain shortness of breath. Patient has been admitted for further management.  Review of Systems: As presented in the history of presenting illness, rest negative.  Past Medical History  Diagnosis Date  . CAD (coronary artery disease)   . CHF (congestive heart failure)   . Paroxysmal atrial fibrillation   . Bradycardia   . Hypertension   . Renal artery stenosis   . COPD (chronic obstructive pulmonary disease)   . Obesity hypoventilation syndrome   . Pruritus   . Tracheobronchitis   . Acute rhinosinusitis   . Anemia   . Hypoxemia   . Peripheral edema   . Obesity, endogenous   . Diabetes mellitus   . Acid reflux disease   . Osteoarthritis, generalized   . Hypothyroidism   . Lung nodule     lingula  . OSA (obstructive sleep apnea)     NPSG 04/03/00--AHI 28.hr  . DVT (deep venous thrombosis) 2003    left leg  . Vaginal atrophy   . Hx of colonic polyps   . Acute diastolic heart failure   . Secondary pulmonary  hypertension 06/09/2013   Past Surgical History  Procedure Laterality Date  . Lumbar spine surgery    . Cholecystectomy    . Hemorroidectomy    . Repair wound fistula    . Right shoulder    . Elbow surgery    . Dilation and curettage of uterus    . Breast surgery  1998    lumpectomy  . Oophorectomy  1959    BSO-? ovarian cancer   Social History:  reports that she quit smoking about 34 years ago. She has never used smokeless tobacco. She reports that she does not drink alcohol or use illicit drugs. Where does patient live nursing home. Can patient participate in ADLs? Yes.  Allergies  Allergen Reactions  . Cefuroxime Axetil Swelling and Rash    Throat swelling  . Cephalexin Swelling and Rash    Throat swelling  . Clarithromycin Swelling and Rash    Throat swelling  . Doxycycline Swelling and Rash     rash on legs and feet, throat swelling  . Erythromycin Swelling and Rash    Throat swelling  . Penicillins Swelling and Rash    Throat swelling  . Tetracycline Swelling and Rash    Throat swelling    Family History:  Family History  Problem Relation Age of Onset  . Pneumonia Mother   . Heart disease Mother   . Allergies Mother   . Emphysema Mother   . Arthritis Mother   .  Heart attack Father   . Stroke Sister   . Diabetes Sister   . Heart disease Sister   . Diabetes Brother   . Diabetes      grandfather  . Clotting disorder Brother   . Arthritis Sister   . Breast cancer      aunt  . Bone cancer      aunt      Prior to Admission medications   Medication Sig Start Date End Date Taking? Authorizing Provider  albuterol (VENTOLIN HFA) 108 (90 BASE) MCG/ACT inhaler Inhale 2 puffs into the lungs every 4 (four) hours as needed for wheezing or shortness of breath. 06/16/13 03/28/15 Yes Adeline Joselyn Glassman, MD  amiodarone (PACERONE) 200 MG tablet Take 1 tablet (200 mg total) by mouth daily. 06/29/13  Yes Everette Rank, MD  amLODipine (NORVASC) 5 MG tablet Take 5 mg by  mouth daily.   Yes Historical Provider, MD  azelastine (ASTELIN) 137 MCG/SPRAY nasal spray Place 2 sprays into the nose daily. Use in each nostril as directed 04/23/13 08/04/14 Yes Clinton D Young, MD  budesonide-formoterol [facility]) 160-4.5 MCG/ACT inhaler Inhale 2 puffs into the lungs 2 (two) times daily. 06/16/13 03/28/15 Yes Adeline C Viyuoh, MD  clopidogrel (PLAVIX) 75 MG tablet Take 75 mg by mouth daily.    Yes Historical Provider, MD  colchicine 0.6 MG tablet Take 0.6 mg by mouth 2 (two) times daily.  06/16/13  Yes Adeline Joselyn Glassman, MD  cyclobenzaprine (FLEXERIL) 10 MG tablet Take 10 mg by mouth daily.    Yes Historical Provider, MD  diltiazem (CARDIZEM) 120 MG tablet Take 240 mg by mouth daily.    Yes Historical Provider, MD  furosemide (LASIX) 40 MG tablet Take 1 tablet (40 mg total) by mouth 2 (two) times daily. 06/16/13  Yes Adeline C Viyuoh, MD  glimepiride (AMARYL) 4 MG tablet Take 4 mg by mouth daily with breakfast. Once a day   Yes Historical Provider, MD  ibandronate (BONIVA) 150 MG tablet Take 150 mg by mouth every 30 (thirty) days. Take in the morning with a full glass of water, on an empty stomach, and do not take anything else by mouth or lie down for the next 30 min. (on or about the 25th of each month)   Yes Historical Provider, MD  insulin glargine (LANTUS) 100 UNIT/ML injection Inject 30 Units into the skin at bedtime as needed (for CBG over 200).    Yes Historical Provider, MD  isosorbide mononitrate (IMDUR) 30 MG 24 hr tablet Take 30 mg by mouth daily.    Yes Historical Provider, MD  levothyroxine (SYNTHROID, LEVOTHROID) 25 MCG tablet Take 25 mcg by mouth See admin instructions. Take 1 tablet (25 mcg) on Monday, Wednesday, Friday (take 50 mcg on Tues, Thurs, Sat, Sun)   Yes Historical Provider, MD  levothyroxine (SYNTHROID, LEVOTHROID) 50 MCG tablet Take 50 mcg by mouth See admin instructions. Take 1 tablet (50 mcg) on Tuesday, Thursday, Saturday, Sunday (take 25 mcg on Monday,  Wednesday and Friday)   Yes Historical Provider, MD  lisinopril (PRINIVIL,ZESTRIL) 10 MG tablet Take 10 mg by mouth daily.    Yes Historical Provider, MD  loperamide (IMODIUM A-D) 2 MG tablet Take 2-4 mg by mouth 3 (three) times daily as needed for diarrhea or loose stools.   Yes Historical Provider, MD  LORazepam (ATIVAN) 0.5 MG tablet Take 0.25 mg by mouth 3 (three) times daily.   Yes Historical Provider, MD  nitrofurantoin, macrocrystal-monohydrate, (MACROBID) 100 MG capsule Take  100 mg by mouth 2 (two) times daily. 7 day course started 07/06/2013   Yes Historical Provider, MD  nitroGLYCERIN (NITROSTAT) 0.4 MG SL tablet Place 0.4 mg under the tongue every 5 (five) minutes as needed for chest pain.    Yes Historical Provider, MD  ondansetron (ZOFRAN) 4 MG tablet Take 4 mg by mouth every 8 (eight) hours as needed for nausea or vomiting.   Yes Historical Provider, MD  oxybutynin (DITROPAN XL) 10 MG 24 hr tablet Take 10 mg by mouth daily.    Yes Historical Provider, MD  OxyCODONE (OXYCONTIN) 20 mg T12A 12 hr tablet Take 20 mg by mouth every 12 (twelve) hours.   Yes Historical Provider, MD  oxyCODONE-acetaminophen (PERCOCET/ROXICET) 5-325 MG per tablet Take 1-2 tablets by mouth every 6 (six) hours as needed for severe pain. 06/16/13  Yes Adeline C Viyuoh, MD  ramelteon (ROZEREM) 8 MG tablet Take 8 mg by mouth at bedtime.   Yes Historical Provider, MD    Physical Exam: Filed Vitals:   07/09/13 1618 07/09/13 1715 07/09/13 1945 07/09/13 2100  BP: 105/41 100/40 105/36 109/33  Pulse: 90 99 76 93  Temp:      TempSrc:      Resp: 24 21 22 21   SpO2: 100% 100% 100% 99%     General:  Well developed and nourished.  Eyes: Anicteric no pallor.  ENT: No discharge from the ears eyes nose mouth.  Neck: No mass felt.  Cardiovascular: S1-S2 heard.  Respiratory: No rhonchi or crepitations.  Abdomen: Soft mild tenderness diffusely no guarding or rigidity.  Skin: No rash.  Musculoskeletal: No  edema.  Psychiatric: Appears normal.  Neurologic: Alert awake oriented to time place and person. Moves all extremities.  Labs on Admission:  Basic Metabolic Panel:  Recent Labs Lab 07/09/13 1542  NA 128*  K 4.6  CL 90*  CO2 10*  GLUCOSE 112*  BUN 116*  CREATININE 7.66*  CALCIUM 7.6*   Liver Function Tests:  Recent Labs Lab 07/09/13 1542  AST 24  ALT 33  ALKPHOS 150*  BILITOT 0.2*  PROT 7.2  ALBUMIN 3.1*   No results found for this basename: LIPASE, AMYLASE,  in the last 168 hours No results found for this basename: AMMONIA,  in the last 168 hours CBC:  Recent Labs Lab 07/09/13 1542  WBC 9.2  NEUTROABS 7.3  HGB 11.6*  HCT 33.5*  MCV 84.8  PLT 313   Cardiac Enzymes: No results found for this basename: CKTOTAL, CKMB, CKMBINDEX, TROPONINI,  in the last 168 hours  BNP (last 3 results)  Recent Labs  06/07/13 1300 06/13/13 0406  PROBNP 3554.0* 1503.0*   CBG: No results found for this basename: GLUCAP,  in the last 168 hours  Radiological Exams on Admission: Dg Chest 2 View  07/09/2013   CLINICAL DATA:  Abdominal pain with nausea and weakness, recent history of pneumonia  EXAM: CHEST  2 VIEW  COMPARISON:  CT scan of the chest of June 07, 2013.  FINDINGS: The lungs are well-expanded. The posterior right lower lobe infiltrate demonstrated on the previous CT scan is not evident on this chest x-ray. The cardiac silhouette is top-normal in size. The pulmonary vascularity is not engorged. There is no pleural effusion. There is no pneumothorax. There is mild loss of height of multiple mid and lower thoracic vertebral bodies without evidence of high-grade which compression. The observed portions of the upper abdomen reveal no definite abnormal gas collection.  IMPRESSION: 1. There is  no evidence of residual pneumonia in the right lower lobe. 2. There is borderline enlargement of cardiac silhouette without overt evidence of CHF.   Electronically Signed   By: David   [place]   On: 07/09/2013 16:49    EKG: Independently reviewed. Atrial fibrillation with rates around 107 beats per minute. Nonspecific ST-T changes in the inferolateral leads.  Assessment/Plan Principal Problem:   Acute renal failure Active Problems:   HYPOTHYROIDISM   DM   Atrial fibrillation   Metabolic acidosis   Nausea vomiting and diarrhea   1. Acute on chronic renal failure nonoliguric - probably precipitated by patient's recurrent nausea vomiting and diarrhea with hypotension and patient being on Lasix and lisinopril further worsening. At this time CT abdomen and pelvis has been ordered to check for any hydronephrosis or obstruction and also check for colitis. Check urinalysis, urine sodium and creatinine to calculate FeNa. I think patient probably has acute renal failure from prerenal and possible ATN. Closely follow intake output and metabolic panel. Since patient is acidotic patient has been placed on bicarbonate drip. Hold Lasix lisinopril. 2. Severe metabolic acidosis from #1 - see #1. 3. Nausea vomiting diarrhea with abdominal cramps - suspect C. difficile colitis. Patient has been placed on Flagyl empirically for now. Check cultures. CT abdomen and pelvis is pending. 4. Atrial fibrillation - rate mildly high. Closely monitor in telemetry. Continue amiodarone. Patient was felt not a candidate for Coumadin secondary to falls. 5. Diabetes mellitus type 2 - presently holding off antidiabetic medications because of acute renal failure. Closely follow CBGs with sliding-scale coverage. 6. History of CHF - holding off diuretics due to #1. 7. Hypothyroidism - continue Synthroid. 8. History of gout - holding off antigout medications due to renal failure. 9. Anemia - closely follow CBC.    Code Status: Full code.  Family Communication: Family at the bedside.  Disposition Plan: Admit to inpatient.    Meha Vidrine N. [facility] Pager [phone].  If 7PM-7AM, please  contact night-coverage [email or web address] Password [facility] 07/09/2013, 9:32 PM

## 2013-07-10 DIAGNOSIS — E43 Unspecified severe protein-calorie malnutrition: Secondary | ICD-10-CM | POA: Diagnosis present

## 2013-07-10 DIAGNOSIS — I4891 Unspecified atrial fibrillation: Secondary | ICD-10-CM

## 2013-07-10 LAB — CBC WITH DIFFERENTIAL/PLATELET
Basophils Relative: 0 % (ref 0–1)
Eosinophils Relative: 0 % (ref 0–5)
Hemoglobin: 9.1 g/dL — ABNORMAL LOW (ref 12.0–15.0)
Lymphocytes Relative: 13 % (ref 12–46)
Lymphs Abs: 0.9 10*3/uL (ref 0.7–4.0)
MCHC: 34.1 g/dL (ref 30.0–36.0)
Monocytes Relative: 7 % (ref 3–12)
Neutro Abs: 5.7 10*3/uL (ref 1.7–7.7)
Neutrophils Relative %: 80 % — ABNORMAL HIGH (ref 43–77)
RBC: 3.11 MIL/uL — ABNORMAL LOW (ref 3.87–5.11)
WBC: 7.1 10*3/uL (ref 4.0–10.5)

## 2013-07-10 LAB — GLUCOSE, CAPILLARY
Glucose-Capillary: 44 mg/dL — CL (ref 70–99)
Glucose-Capillary: 50 mg/dL — ABNORMAL LOW (ref 70–99)
Glucose-Capillary: 148 mg/dL — ABNORMAL HIGH (ref 70–99)
Glucose-Capillary: 111 mg/dL — ABNORMAL HIGH (ref 70–99)

## 2013-07-10 LAB — COMPREHENSIVE METABOLIC PANEL
ALT: 25 U/L (ref 0–35)
Alkaline Phosphatase: 120 U/L — ABNORMAL HIGH (ref 39–117)
CO2: 10 mEq/L — CL (ref 19–32)
Calcium: 6.4 mg/dL — CL (ref 8.4–10.5)
GFR calc Af Amer: 6 mL/min — ABNORMAL LOW (ref >90)
GFR calc non Af Amer: 5 mL/min — ABNORMAL LOW (ref >90)
Glucose, Bld: 154 mg/dL — ABNORMAL HIGH (ref 70–99)
Potassium: 4.2 mEq/L (ref 3.5–5.1)
Sodium: 131 mEq/L — ABNORMAL LOW (ref 135–145)

## 2013-07-10 MED ORDER — WHITE PETROLATUM GEL
Status: AC
  Filled 2013-07-10: qty 5

## 2013-07-10 MED ORDER — DEXTROSE 50 % IV SOLN
1.0000 | Freq: Once | INTRAVENOUS | Status: AC
  Administered 2013-07-10: 50 mL via INTRAVENOUS

## 2013-07-10 MED ORDER — DILTIAZEM HCL ER COATED BEADS 240 MG PO CP24
240.0000 mg | ORAL_CAPSULE | Freq: Every day | ORAL | Status: DC
  Administered 2013-07-11 – 2013-07-12 (×2): 240 mg via ORAL
  Filled 2013-07-10 (×4): qty 1

## 2013-07-10 MED ORDER — PROMETHAZINE HCL 25 MG/ML IJ SOLN
12.5000 mg | Freq: Four times a day (QID) | INTRAMUSCULAR | Status: DC | PRN
  Administered 2013-07-10 – 2013-07-11 (×2): 12.5 mg via INTRAVENOUS
  Filled 2013-07-10 (×2): qty 1

## 2013-07-10 MED ORDER — DEXTROSE 50 % IV SOLN
INTRAVENOUS | Status: AC
  Filled 2013-07-10: qty 50

## 2013-07-10 MED ORDER — WHITE PETROLATUM GEL
Status: AC
  Administered 2013-07-10: 0.2
  Filled 2013-07-10: qty 5

## 2013-07-10 MED ORDER — ONDANSETRON HCL 4 MG/2ML IJ SOLN
4.0000 mg | Freq: Once | INTRAMUSCULAR | Status: AC
  Administered 2013-07-10: 4 mg via INTRAVENOUS

## 2013-07-10 MED ORDER — BOOST / RESOURCE BREEZE PO LIQD
1.0000 | Freq: Three times a day (TID) | ORAL | Status: DC
  Administered 2013-07-11 – 2013-07-14 (×5): 1 via ORAL

## 2013-07-10 MED ORDER — DEXTROSE 50 % IV SOLN: 50.0000 mL | Freq: Once | INTRAVENOUS | Status: AC | PRN

## 2013-07-10 NOTE — Progress Notes (Addendum)
Hypoglycemic Event  CBG: 44  Treatment: 15 GM carbohydrate snack  Symptoms: None  Follow-up CBG: Time:1305 CBG Result:50  Possible Reasons for Event: Vomiting and Inadequate meal intake  Comments/MD notified:Dr. Rizwan on unit.  1258 Order to give 1 amp D50. Monitor BG Q2 hours  F/U BG 148 at 1330. Will continue to monitor.    Ozella Rocks P  Remember to initiate Hypoglycemia Order Set & complete

## 2013-07-10 NOTE — Progress Notes (Signed)
Pt with low BP. Asymptomatic. MD on call notified. No new orders given. Will continue to monitor.

## 2013-07-10 NOTE — Progress Notes (Signed)
Physician notified: Rizwan At: 73  Regarding: Pt. Dry heaving, last zofran given at 1300. Awaiting return response.  Returned Response at: 1635  Order(s): Will order phenergan.

## 2013-07-10 NOTE — Progress Notes (Signed)
[facility] Progress Note [facility] TEAM 1 - Stepdown/ICU TEAM   [name] RUE:[id number] DOB: 1934-01-14 DOA: 07/09/2013 PCP: Gweneth Dimitri, MD  Brief narrative: This is a 79 year old female with the past medical history of grade 2 diastolic congestive heart failure and right-sided pneumonia last month who presents with 3 weeks of nausea and vomiting and is found to have hypotension and acute renal failure on arrival. Apparently as H&P states, patient was found to have a UTI at the nursing facility and started on Macrobid a few days ago.  Assessment/Plan: Principal Problem:   Acute renal failure on CKD stage III - Prerenal and ATN - due to use of Lasix, lisinopril and colchicine along with dehydration from nausea and vomiting - Continuing to make urine with slight improvement in creatinine today -Hydrate carefully as patient has significant diastolic heart failure  Active Problems:    Nausea vomiting and diarrhea - Question gastroenteritis-CT of the abdomen without contrast is negative -GI pathogen panel is pending -C. difficile is negative -If no cause found and symptoms do not resolve will consider in consult and GI    Protein-calorie malnutrition, severe -Poor by mouth intake currently due to vomiting    HYPOTHYROIDISM -Continue Synthroid    DM  -Low-dose insulin sliding scale for now due to poor by mouth intake    Atrial fibrillation-chronic - Rate is relatively well controlled-she was able to tolerate oral Cardizem and amiodarone this morning -Takes Plavix which has been resumed    Metabolic acidosis -Due to acute renal failure and diarrhea  COPD on chronic oxygen with secondary pulmonary hypertension -Currently no exacerbation  Obstructive sleep apnea  Gout -hold colchicine   Code Status: Full code Family Communication: None Disposition Plan: Follow in step down unit  today  Consultants: None  Procedures: None  Antibiotics: none  DVT prophylaxis: heparin  HPI/Subjective: Patient alert and attempting to eat a liquid diet. States she is mildly nauseated. No significant abdominal pain. Per RN she is having watery stools.   Objective: Blood pressure 105/51, pulse 101, temperature 97.3 F (36.3 C), temperature source Oral, resp. rate 20, height 5' 0.25" (1.53 m), weight 80.1 kg (176 lb 9.4 oz), SpO2 100.00%.  Intake/Output Summary (Last 24 hours) at 07/10/13 1636 Last data filed at 07/10/13 1500  Gross per 24 hour  Intake   1500 ml  Output    601 ml  Net    899 ml     Exam: General: No acute respiratory distress Lungs: Clear to auscultation bilaterally without wheezes or crackles Cardiovascular: Iregular rate and rhythm without murmur gallop or rub normal S1 and S2- HR in 90s to low 100s Abdomen: Mild tenderness in epigastrium, nondistended, soft, bowel sounds positive, no rebound, no ascites, no appreciable mass Extremities: No significant cyanosis, clubbing, or edema bilateral lower extremities  Data Reviewed: Basic Metabolic Panel:  Recent Labs Lab 07/09/13 1542 07/09/13 2230 07/10/13 0435  NA 128*  --  131*  K 4.6  --  4.2  CL 90*  --  92*  CO2 10*  --  10*  GLUCOSE 112*  --  154*  BUN 116*  --  114*  CREATININE 7.66* 7.24* 7.18*  CALCIUM 7.6*  --  6.4*   Liver Function Tests:  Recent Labs Lab 07/09/13 1542 07/10/13 0435  AST 24 19  ALT 33 25  ALKPHOS 150* 120*  BILITOT 0.2* 0.2*  PROT 7.2 5.7*  ALBUMIN 3.1* 2.4*   No results found for this basename: LIPASE, AMYLASE,  in the last 168 hours No results found for this basename: AMMONIA,  in the last 168 hours CBC:  Recent Labs Lab 07/09/13 1542 07/09/13 2230 07/10/13 0435  WBC 9.2 10.1 7.1  NEUTROABS 7.3  --  5.7  HGB 11.6* 10.3* 9.1*  HCT 33.5* 30.3* 26.7*  MCV 84.8 87.1 85.9  PLT 313 295 267   Cardiac Enzymes:  Recent Labs Lab 07/09/13 2230   CKTOTAL 57  TROPONINI <0.30   BNP (last 3 results)  Recent Labs  06/07/13 1300 06/13/13 0406  PROBNP 3554.0* 1503.0*   CBG:  Recent Labs Lab 07/10/13 0812 07/10/13 1224 07/10/13 1309 07/10/13 1336 07/10/13 1626  GLUCAP 70 44* 50* 148* 120*    Recent Results (from the past 240 hour(s))  CLOSTRIDIUM DIFFICILE BY PCR     Status: None   Collection Time    07/09/13  4:16 PM      Result Value Range Status   C difficile by pcr NEGATIVE  NEGATIVE Final  MRSA PCR SCREENING     Status: None   Collection Time    07/09/13 10:40 PM      Result Value Range Status   MRSA by PCR NEGATIVE  NEGATIVE Final   Comment:            The GeneXpert MRSA Assay (FDA     approved for NASAL specimens     only), is one component of a     comprehensive MRSA colonization     surveillance program. It is not     intended to diagnose MRSA     infection nor to guide or     monitor treatment for     MRSA infections.     Studies:  Recent x-ray studies have been reviewed in detail by the Attending Physician  Scheduled Meds:  Scheduled Meds: . amiodarone  200 mg Oral Daily  . azelastine  2 spray Each Nare Daily  . budesonide-formoterol  2 puff Inhalation BID  . clopidogrel  75 mg Oral Daily  . dextrose      . diltiazem  240 mg Oral Daily  . feeding supplement (RESOURCE BREEZE)  1 Container Oral TID BM  . heparin  5,000 Units Subcutaneous Q8H  . insulin aspart  0-9 Units Subcutaneous TID WC  . levothyroxine  25 mcg Oral Custom  . [START ON 07/11/2013] levothyroxine  50 mcg Oral Custom  . LORazepam  0.25 mg Oral TID  . OxyCODONE  20 mg Oral Q12H  . ramelteon  8 mg Oral QHS  . sodium chloride  3 mL Intravenous Q12H  . white petrolatum       Continuous Infusions: .  sodium bicarbonate  infusion 1000 mL 125 mL/hr at 07/10/13 0746    Time spent on care of this patient: 35 minute   Calvert Cantor, MD  [facility] Office  [phone] Pager - Text Page per Loretha Stapler as per  below:  On-Call/Text Page:      [email or web address]      password [email or web address]  If 7PM-7AM, please contact night-coverage [email or web address] Password [email or web address] 07/10/2013, 4:36 PM   LOS: 1 day

## 2013-07-10 NOTE — Progress Notes (Signed)
INITIAL NUTRITION ASSESSMENT  DOCUMENTATION CODES Per approved criteria  -Severe malnutrition in the context of acute illness or injury   INTERVENTION:  Resource Breeze PO TID, each supplement provides 250 kcal and 9 grams of protein.  NUTRITION DIAGNOSIS: Malnutrition related to inadequate oral intake as evidenced by 7% weight loss in the past month and moderate depletion of muscle mass.   Goal: Intake to meet >90% of estimated nutrition needs.  Monitor:  PO intake, labs, weight trend.  Reason for Assessment: MST=3  79 y.o. female  Admitting Dx: Acute renal failure; Weakness; nausea and vomiting; diarrhea  ASSESSMENT: Last few days patient has been become very weak and was brought to the ER. Initially patient was found to be hypotensive and blood pressure improved with 1 L normal saline bolus. Patient's labs show elevated creatinine of around 7 with metabolic acidosis. Patient was recently admitted last month for CHF/pneumonia and discharged to nursing home and has been having persistent nausea vomiting and diarrhea over the last 3 weeks.   Patient reports poor appetite and diarrhea caused recent weight loss. She has been eating poorly during recent hospitalization 3 weeks ago and during rehab for the past 3 weeks. She has been drinking Ensure at rehab facility.   Nutrition Focused Physical Exam:  Subcutaneous Fat:  Orbital Region: WNL Upper Arm Region: WNL Thoracic and Lumbar Region: WNL  Muscle:  Temple Region: mild-moderate depletion Clavicle Bone Region: mild-moderate depletion Clavicle and Acromion Bone Region: mild-moderate depletion Scapular Bone Region: WNL Dorsal Hand: WNL Patellar Region: WNL Anterior Thigh Region: WNL Posterior Calf Region: WNL  Edema: none  Pt meets criteria for severe MALNUTRITION in the context of acute illness as evidenced by 7% weight loss in the past month and moderate depletion of muscle mass.   Height: Ht Readings from Last 1  Encounters:  07/09/13 5' 0.25" (1.53 m)    Weight: Wt Readings from Last 1 Encounters:  07/10/13 176 lb 9.4 oz (80.1 kg)    Ideal Body Weight: 46 kg  % Ideal Body Weight: 197%  Wt Readings from Last 10 Encounters:  07/10/13 176 lb 9.4 oz (80.1 kg)  06/16/13 178 lb 9.2 oz (81 kg)  06/04/13 189 lb (85.73 kg)  04/23/13 192 lb 12.8 oz (87.454 kg)  02/27/13 193 lb (87.544 kg)  11/27/12 188 lb (85.276 kg)  09/08/12 193 lb 6.4 oz (87.726 kg)  01/11/12 196 lb 3.2 oz (88.996 kg)  07/13/11 205 lb (92.987 kg)  01/11/11 215 lb 9.6 oz (97.796 kg)    Usual Body Weight: 189 lb 1 month ago  % Usual Body Weight: 93%  BMI:  Body mass index is 34.22 kg/(m^2).  Estimated Nutritional Needs: Kcal: 1400-1600 Protein: 80-100 gm Fluid: 14-1.6 L  Skin: no wounds  Diet Order: Clear Liquid  EDUCATION NEEDS: -Education needs addressed   Intake/Output Summary (Last 24 hours) at 07/10/13 1120 Last data filed at 07/10/13 0900  Gross per 24 hour  Intake    750 ml  Output     51 ml  Net    699 ml    Last BM: 11/28   Labs:   Recent Labs Lab 07/09/13 1542 07/09/13 2230 07/10/13 0435  NA 128*  --  131*  K 4.6  --  4.2  CL 90*  --  92*  CO2 10*  --  10*  BUN 116*  --  114*  CREATININE 7.66* 7.24* 7.18*  CALCIUM 7.6*  --  6.4*  GLUCOSE 112*  --  154*    CBG (last 3)   Recent Labs  07/09/13 2213 07/10/13 0420 07/10/13 0812  GLUCAP 137* 163* 70    Scheduled Meds: . amiodarone  200 mg Oral Daily  . azelastine  2 spray Each Nare Daily  . budesonide-formoterol  2 puff Inhalation BID  . clopidogrel  75 mg Oral Daily  . diltiazem  240 mg Oral Daily  . heparin  5,000 Units Subcutaneous Q8H  . insulin aspart  0-9 Units Subcutaneous TID WC  . levothyroxine  25 mcg Oral Custom  . [START ON 07/11/2013] levothyroxine  50 mcg Oral Custom  . LORazepam  0.25 mg Oral TID  . metronidazole  500 mg Intravenous Q8H  . OxyCODONE  20 mg Oral Q12H  . ramelteon  8 mg Oral QHS  .  sodium chloride  3 mL Intravenous Q12H  . white petrolatum        Continuous Infusions: .  sodium bicarbonate  infusion 1000 mL 125 mL/hr at 07/10/13 0746    Past Medical History  Diagnosis Date  . CAD (coronary artery disease)   . CHF (congestive heart failure)   . Paroxysmal atrial fibrillation   . Bradycardia   . Hypertension   . Renal artery stenosis   . COPD (chronic obstructive pulmonary disease)   . Obesity hypoventilation syndrome   . Pruritus   . Tracheobronchitis   . Acute rhinosinusitis   . Anemia   . Hypoxemia   . Peripheral edema   . Obesity, endogenous   . Diabetes mellitus   . Acid reflux disease   . Osteoarthritis, generalized   . Hypothyroidism   . Lung nodule     lingula  . OSA (obstructive sleep apnea)     NPSG 04/03/00--AHI 28.hr  . DVT (deep venous thrombosis) 2003    left leg  . Vaginal atrophy   . Hx of colonic polyps   . Acute diastolic heart failure   . Secondary pulmonary hypertension 06/09/2013    Past Surgical History  Procedure Laterality Date  . Lumbar spine surgery    . Cholecystectomy    . Hemorroidectomy    . Repair wound fistula    . Right shoulder    . Elbow surgery    . Dilation and curettage of uterus    . Breast surgery  1998    lumpectomy  . Oophorectomy  1959    BSO-? ovarian cancer    Joaquin Courts, RD, LDN, CNSC Pager [phone] After Hours Pager [phone]

## 2013-07-10 NOTE — Progress Notes (Signed)
Pt. No vomiting. Dr. Butler Denmark on unit. OK to give dose of zofran now.

## 2013-07-10 NOTE — Progress Notes (Signed)
Pt. Continues to have nausea and dry heave. Zofran not due yet. Gave phenergan, no relief. Will continue to monitor. Gave patient blanket, declined cool washcloth at this time. VSS.

## 2013-07-10 NOTE — Progress Notes (Signed)
Utilization review completed.  

## 2013-07-11 ENCOUNTER — Inpatient Hospital Stay (HOSPITAL_COMMUNITY): Payer: Medicare Other

## 2013-07-11 DIAGNOSIS — I959 Hypotension, unspecified: Secondary | ICD-10-CM

## 2013-07-11 DIAGNOSIS — I4891 Unspecified atrial fibrillation: Secondary | ICD-10-CM

## 2013-07-11 LAB — GLUCOSE, CAPILLARY
Glucose-Capillary: 126 mg/dL — ABNORMAL HIGH (ref 70–99)
Glucose-Capillary: 126 mg/dL — ABNORMAL HIGH (ref 70–99)
Glucose-Capillary: 137 mg/dL — ABNORMAL HIGH (ref 70–99)
Glucose-Capillary: 139 mg/dL — ABNORMAL HIGH (ref 70–99)
Glucose-Capillary: 147 mg/dL — ABNORMAL HIGH (ref 70–99)
Glucose-Capillary: 158 mg/dL — ABNORMAL HIGH (ref 70–99)
Glucose-Capillary: 167 mg/dL — ABNORMAL HIGH (ref 70–99)
Glucose-Capillary: 82 mg/dL (ref 70–99)

## 2013-07-11 LAB — CBC
HCT: 24.7 % — ABNORMAL LOW (ref 36.0–46.0)
MCH: 29.4 pg (ref 26.0–34.0)
MCV: 83.4 fL (ref 78.0–100.0)
Platelets: 226 10*3/uL (ref 150–400)
RBC: 2.96 MIL/uL — ABNORMAL LOW (ref 3.87–5.11)
RDW: 18.3 % — ABNORMAL HIGH (ref 11.5–15.5)

## 2013-07-11 LAB — BASIC METABOLIC PANEL
BUN: 95 mg/dL — ABNORMAL HIGH (ref 6–23)
CO2: 27 mEq/L (ref 19–32)
Calcium: 6 mg/dL — CL (ref 8.4–10.5)
GFR calc non Af Amer: 9 mL/min — ABNORMAL LOW (ref >90)
Glucose, Bld: 100 mg/dL — ABNORMAL HIGH (ref 70–99)
Sodium: 133 mEq/L — ABNORMAL LOW (ref 135–145)

## 2013-07-11 LAB — URINE CULTURE: Culture: NO GROWTH

## 2013-07-11 LAB — MAGNESIUM: Magnesium: 1.7 mg/dL (ref 1.5–2.5)

## 2013-07-11 LAB — POTASSIUM: Potassium: 4 mEq/L (ref 3.5–5.1)

## 2013-07-11 MED ORDER — SODIUM CHLORIDE 0.9 % IV BOLUS (SEPSIS)
1000.0000 mL | Freq: Once | INTRAVENOUS | Status: AC
  Administered 2013-07-11: 1000 mL via INTRAVENOUS

## 2013-07-11 MED ORDER — POTASSIUM CHLORIDE 20 MEQ/15ML (10%) PO LIQD
40.0000 meq | Freq: Once | ORAL | Status: AC
  Administered 2013-07-11: 40 meq via ORAL
  Filled 2013-07-11: qty 30

## 2013-07-11 MED ORDER — POTASSIUM CHLORIDE CRYS ER 20 MEQ PO TBCR
20.0000 meq | EXTENDED_RELEASE_TABLET | ORAL | Status: AC
  Administered 2013-07-11 (×2): 20 meq via ORAL
  Filled 2013-07-11 (×2): qty 1

## 2013-07-11 MED ORDER — MAGNESIUM SULFATE 40 MG/ML IJ SOLN
2.0000 g | Freq: Once | INTRAMUSCULAR | Status: AC
  Administered 2013-07-11: 2 g via INTRAVENOUS
  Filled 2013-07-11: qty 50

## 2013-07-11 MED ORDER — CALCIUM GLUCONATE 10 % IV SOLN
1.0000 g | Freq: Once | INTRAVENOUS | Status: AC
  Administered 2013-07-11: 1 g via INTRAVENOUS
  Filled 2013-07-11: qty 10

## 2013-07-11 MED ORDER — SODIUM CHLORIDE 0.9 % IV SOLN
INTRAVENOUS | Status: DC
  Administered 2013-07-11: 100 mL/h via INTRAVENOUS
  Administered 2013-07-11 – 2013-07-14 (×2): via INTRAVENOUS
  Administered 2013-07-15: 50 mL/h via INTRAVENOUS

## 2013-07-11 NOTE — Progress Notes (Signed)
Paged Elray Mcgregor of [facility] regarding pt's SBP>90 (see flowsheet). Bolus order received. Pt asymptomatic. Will continue to monitor and assess.

## 2013-07-11 NOTE — Consult Note (Signed)
Reason for Consult: Atrial Fibrillation with rapid ventricular response rate  Referring Physician: Dr. Butler Denmark  Primary Cardiologist: Dr. Bunnie Philips is an 80 y.o. female.   HPI: 79 y/o female who is currently seen in consultation with Dr. Butler Denmark for evaluation of Atrial fibrillation with rapid ventricular response rate.  She has a PMH of OSA (on CPAP) IDDM, oxygen-requiring COPD and PAF that has been followed by Dr. Eldridge Dace.  She has been on Amiodarone for rate control but has not been on anti-coagulation due to recurrent falls.  She was last seen by Dr. Eldridge Dace 06/29/2013 for Atrial fibrillation evaluation. At that time, she was on Amiodarone 400 mg q daily. Due to complaints of nausea, her Amiodarone was decreased from 400 mg to 200 mg q daily with the hopes that her nausea will get better. She is now admitted to Hospitalist service for complaints of persistent nausea/vomiting and diarrhea.  Unfortunately, her serum creatinine has increased to 7.24 (GFR 5 ml/min). She is currently in Atrial fibrillation with heart rates ranging from 109-120 bpm and SBP ranging between 98-122 mmHg. She is currently asymptomatic.  Primary team is requesting discontinuation of Amiodarone which they suspect may be contributing to her persistent nausea and vomiting. She is also on Cardizem 240/day for AFIB rate control.  Past Medical History  Diagnosis Date  . CAD (coronary artery disease)   . CHF (congestive heart failure)   . Paroxysmal atrial fibrillation   . Bradycardia   . Hypertension   . Renal artery stenosis   . COPD (chronic obstructive pulmonary disease)   . Obesity hypoventilation syndrome   . Pruritus   . Tracheobronchitis   . Acute rhinosinusitis   . Anemia   . Hypoxemia   . Peripheral edema   . Obesity, endogenous   . Diabetes mellitus   . Acid reflux disease   . Osteoarthritis, generalized   . Hypothyroidism   . Lung nodule     lingula  . OSA (obstructive sleep  apnea)     NPSG 04/03/00--AHI 28.hr  . DVT (deep venous thrombosis) 2003    left leg  . Vaginal atrophy   . Hx of colonic polyps   . Acute diastolic heart failure   . Secondary pulmonary hypertension 06/09/2013    Past Surgical History  Procedure Laterality Date  . Lumbar spine surgery    . Cholecystectomy    . Hemorroidectomy    . Repair wound fistula    . Right shoulder    . Elbow surgery    . Dilation and curettage of uterus    . Breast surgery  1998    lumpectomy  . Oophorectomy  1959    BSO-? ovarian cancer    Family History  Problem Relation Age of Onset  . Pneumonia Mother   . Heart disease Mother   . Allergies Mother   . Emphysema Mother   . Arthritis Mother   . Heart attack Father   . Stroke Sister   . Diabetes Sister   . Heart disease Sister   . Diabetes Brother   . Diabetes      grandfather  . Clotting disorder Brother   . Arthritis Sister   . Breast cancer      aunt  . Bone cancer      aunt    Social History:  reports that she quit smoking about 34 years ago. She has never used smokeless tobacco. She reports that she does not drink alcohol or use  illicit drugs.  Allergies:  Allergies  Allergen Reactions  . Cefuroxime Axetil Swelling and Rash    Throat swelling  . Cephalexin Swelling and Rash    Throat swelling  . Clarithromycin Swelling and Rash    Throat swelling  . Doxycycline Swelling and Rash     rash on legs and feet, throat swelling  . Erythromycin Swelling and Rash    Throat swelling  . Penicillins Swelling and Rash    Throat swelling  . Tetracycline Swelling and Rash    Throat swelling    Medications: reviewed  Results for orders placed during the hospital encounter of 07/09/13 (from the past 48 hour(s))  GLUCOSE, CAPILLARY     Status: Abnormal   Collection Time    07/09/13 10:13 PM      Result Value Range   Glucose-Capillary 137 (*) 70 - 99 mg/dL   Comment 1 Notify RN    LACTIC ACID, PLASMA     Status: None   Collection  Time    07/09/13 10:30 PM      Result Value Range   Lactic Acid, Venous 0.7  0.5 - 2.2 mmol/L  TROPONIN I     Status: None   Collection Time    07/09/13 10:30 PM      Result Value Range   Troponin I <0.30  <0.30 ng/mL   Comment:            Due to the release kinetics of cTnI,     a negative result within the first hours     of the onset of symptoms does not rule out     myocardial infarction with certainty.     If myocardial infarction is still suspected,     repeat the test at appropriate intervals.  CK     Status: None   Collection Time    07/09/13 10:30 PM      Result Value Range   Total CK 57  7 - 177 U/L  TSH     Status: None   Collection Time    07/09/13 10:30 PM      Result Value Range   TSH 3.717  0.350 - 4.500 uIU/mL   Comment: Performed at [facility]  CBC     Status: Abnormal   Collection Time    07/09/13 10:30 PM      Result Value Range   WBC 10.1  4.0 - 10.5 K/uL   RBC 3.48 (*) 3.87 - 5.11 MIL/uL   Hemoglobin 10.3 (*) 12.0 - 15.0 g/dL   HCT 16.1 (*) 09.6 - 04.5 %   MCV 87.1  78.0 - 100.0 fL   MCH 29.6  26.0 - 34.0 pg   MCHC 34.0  30.0 - 36.0 g/dL   RDW 40.9 (*) 81.1 - 91.4 %   Platelets 295  150 - 400 K/uL  CREATININE, SERUM     Status: Abnormal   Collection Time    07/09/13 10:30 PM      Result Value Range   Creatinine, Ser 7.24 (*) 0.50 - 1.10 mg/dL   GFR calc non Af Amer 5 (*) >90 mL/min   GFR calc Af Amer 6 (*) >90 mL/min   Comment: (NOTE)     The eGFR has been calculated using the CKD EPI equation.     This calculation has not been validated in all clinical situations.     eGFR's persistently <90 mL/min signify possible Chronic Kidney     Disease.  SODIUM, URINE, RANDOM     Status: None   Collection Time    07/09/13 10:38 PM      Result Value Range   Sodium, Ur 15    CREATININE, URINE, RANDOM     Status: None   Collection Time    07/09/13 10:38 PM      Result Value Range   Creatinine, Urine 147.00    URINE CULTURE     Status:  None   Collection Time    07/09/13 10:40 PM      Result Value Range   Specimen Description URINE, CATHETERIZED     Special Requests NONE     Culture  Setup Time       Value: 07/10/2013 09:06     Performed at [facility] Count       Value: NO GROWTH     Performed at [facility]   Culture       Value: NO GROWTH     Performed at [facility]   Report Status 07/11/2013 FINAL    MRSA PCR SCREENING     Status: None   Collection Time    07/09/13 10:40 PM      Result Value Range   MRSA by PCR NEGATIVE  NEGATIVE   Comment:            The GeneXpert MRSA Assay (FDA     approved for NASAL specimens     only), is one component of a     comprehensive MRSA colonization     surveillance program. It is not     intended to diagnose MRSA     infection nor to guide or     monitor treatment for     MRSA infections.  CULTURE, BLOOD (ROUTINE X 2)     Status: None   Collection Time    07/10/13  1:14 AM      Result Value Range   Specimen Description BLOOD LEFT HAND     Special Requests BOTTLES DRAWN AEROBIC AND ANAEROBIC 10CC EACH     Culture  Setup Time       Value: 07/10/2013 08:57     Performed at [facility]   Culture       Value:        BLOOD CULTURE RECEIVED NO GROWTH TO DATE CULTURE WILL BE HELD FOR 5 DAYS BEFORE ISSUING A FINAL NEGATIVE REPORT     Performed at [facility]   Report Status PENDING    GLUCOSE, CAPILLARY     Status: Abnormal   Collection Time    07/10/13  4:20 AM      Result Value Range   Glucose-Capillary 163 (*) 70 - 99 mg/dL   Comment 1 Notify RN    COMPREHENSIVE METABOLIC PANEL     Status: Abnormal   Collection Time    07/10/13  4:35 AM      Result Value Range   Sodium 131 (*) 135 - 145 mEq/L   Potassium 4.2  3.5 - 5.1 mEq/L   Chloride 92 (*) 96 - 112 mEq/L   CO2 10 (*) 19 - 32 mEq/L   Comment: CRITICAL RESULT CALLED TO, READ BACK BY AND VERIFIED WITH:     W RICE,RN 07/10/13 0625 RHOLMES   Glucose, Bld  154 (*) 70 - 99 mg/dL   BUN 161 (*) 6 - 23 mg/dL   Creatinine, Ser 0.96 (*) 0.50 - 1.10 mg/dL  Calcium 6.4 (*) 8.4 - 10.5 mg/dL   Comment: CRITICAL RESULT CALLED TO, READ BACK BY AND VERIFIED WITH:     W RICE,RN 07/10/13 0625 RHOLMES   Total Protein 5.7 (*) 6.0 - 8.3 g/dL   Albumin 2.4 (*) 3.5 - 5.2 g/dL   AST 19  0 - 37 U/L   ALT 25  0 - 35 U/L   Alkaline Phosphatase 120 (*) 39 - 117 U/L   Total Bilirubin 0.2 (*) 0.3 - 1.2 mg/dL   GFR calc non Af Amer 5 (*) >90 mL/min   GFR calc Af Amer 6 (*) >90 mL/min   Comment: (NOTE)     The eGFR has been calculated using the CKD EPI equation.     This calculation has not been validated in all clinical situations.     eGFR's persistently <90 mL/min signify possible Chronic Kidney     Disease.  CBC WITH DIFFERENTIAL     Status: Abnormal   Collection Time    07/10/13  4:35 AM      Result Value Range   WBC 7.1  4.0 - 10.5 K/uL   RBC 3.11 (*) 3.87 - 5.11 MIL/uL   Hemoglobin 9.1 (*) 12.0 - 15.0 g/dL   HCT 16.1 (*) 09.6 - 04.5 %   MCV 85.9  78.0 - 100.0 fL   MCH 29.3  26.0 - 34.0 pg   MCHC 34.1  30.0 - 36.0 g/dL   RDW 40.9 (*) 81.1 - 91.4 %   Platelets 267  150 - 400 K/uL   Neutrophils Relative % 80 (*) 43 - 77 %   Neutro Abs 5.7  1.7 - 7.7 K/uL   Lymphocytes Relative 13  12 - 46 %   Lymphs Abs 0.9  0.7 - 4.0 K/uL   Monocytes Relative 7  3 - 12 %   Monocytes Absolute 0.5  0.1 - 1.0 K/uL   Eosinophils Relative 0  0 - 5 %   Eosinophils Absolute 0.0  0.0 - 0.7 K/uL   Basophils Relative 0  0 - 1 %   Basophils Absolute 0.0  0.0 - 0.1 K/uL  GLUCOSE, CAPILLARY     Status: None   Collection Time    07/10/13  8:12 AM      Result Value Range   Glucose-Capillary 70  70 - 99 mg/dL   Comment 1 Documented in Chart     Comment 2 Notify RN    GLUCOSE, CAPILLARY     Status: Abnormal   Collection Time    07/10/13 12:24 PM      Result Value Range   Glucose-Capillary 44 (*) 70 - 99 mg/dL  GLUCOSE, CAPILLARY     Status: Abnormal   Collection Time     07/10/13  1:09 PM      Result Value Range   Glucose-Capillary 50 (*) 70 - 99 mg/dL  GLUCOSE, CAPILLARY     Status: Abnormal   Collection Time    07/10/13  1:36 PM      Result Value Range   Glucose-Capillary 148 (*) 70 - 99 mg/dL  GLUCOSE, CAPILLARY     Status: Abnormal   Collection Time    07/10/13  4:26 PM      Result Value Range   Glucose-Capillary 120 (*) 70 - 99 mg/dL  GLUCOSE, CAPILLARY     Status: Abnormal   Collection Time    07/10/13  6:40 PM      Result Value Range   Glucose-Capillary  104 (*) 70 - 99 mg/dL  GLUCOSE, CAPILLARY     Status: Abnormal   Collection Time    07/10/13  8:04 PM      Result Value Range   Glucose-Capillary 111 (*) 70 - 99 mg/dL  GLUCOSE, CAPILLARY     Status: Abnormal   Collection Time    07/10/13 10:00 PM      Result Value Range   Glucose-Capillary 136 (*) 70 - 99 mg/dL   Comment 1 Documented in Chart     Comment 2 Notify RN    GLUCOSE, CAPILLARY     Status: Abnormal   Collection Time    07/10/13 11:56 PM      Result Value Range   Glucose-Capillary 137 (*) 70 - 99 mg/dL   Comment 1 Documented in Chart     Comment 2 Notify RN    GLUCOSE, CAPILLARY     Status: Abnormal   Collection Time    07/11/13  2:28 AM      Result Value Range   Glucose-Capillary 139 (*) 70 - 99 mg/dL   Comment 1 Documented in Chart     Comment 2 Notify RN    GLUCOSE, CAPILLARY     Status: Abnormal   Collection Time    07/11/13  4:04 AM      Result Value Range   Glucose-Capillary 126 (*) 70 - 99 mg/dL   Comment 1 Documented in Chart     Comment 2 Notify RN    BASIC METABOLIC PANEL     Status: Abnormal   Collection Time    07/11/13  5:04 AM      Result Value Range   Sodium 133 (*) 135 - 145 mEq/L   Potassium 3.0 (*) 3.5 - 5.1 mEq/L   Comment: DELTA CHECK NOTED   Chloride 93 (*) 96 - 112 mEq/L   CO2 27  19 - 32 mEq/L   Glucose, Bld 100 (*) 70 - 99 mg/dL   BUN 95 (*) 6 - 23 mg/dL   Creatinine, Ser 1.61 (*) 0.50 - 1.10 mg/dL   Comment: DELTA CHECK NOTED    Calcium 6.0 (*) 8.4 - 10.5 mg/dL   Comment: CRITICAL RESULT CALLED TO, READ BACK BY AND VERIFIED WITH:     YORK M,RN 07/11/13 0557 WAYK   GFR calc non Af Amer 9 (*) >90 mL/min   GFR calc Af Amer 11 (*) >90 mL/min   Comment: (NOTE)     The eGFR has been calculated using the CKD EPI equation.     This calculation has not been validated in all clinical situations.     eGFR's persistently <90 mL/min signify possible Chronic Kidney     Disease.  MAGNESIUM     Status: Abnormal   Collection Time    07/11/13  5:04 AM      Result Value Range   Magnesium 1.2 (*) 1.5 - 2.5 mg/dL  CBC     Status: Abnormal   Collection Time    07/11/13  5:04 AM      Result Value Range   WBC 4.8  4.0 - 10.5 K/uL   RBC 2.96 (*) 3.87 - 5.11 MIL/uL   Hemoglobin 8.7 (*) 12.0 - 15.0 g/dL   HCT 09.6 (*) 04.5 - 40.9 %   MCV 83.4  78.0 - 100.0 fL   MCH 29.4  26.0 - 34.0 pg   MCHC 35.2  30.0 - 36.0 g/dL   RDW 81.1 (*) 91.4 - 78.2 %  Platelets 226  150 - 400 K/uL  GLUCOSE, CAPILLARY     Status: None   Collection Time    07/11/13  6:03 AM      Result Value Range   Glucose-Capillary 82  70 - 99 mg/dL   Comment 1 Documented in Chart     Comment 2 Notify RN    GLUCOSE, CAPILLARY     Status: None   Collection Time    07/11/13  7:51 AM      Result Value Range   Glucose-Capillary 78  70 - 99 mg/dL  GLUCOSE, CAPILLARY     Status: Abnormal   Collection Time    07/11/13 11:18 AM      Result Value Range   Glucose-Capillary 147 (*) 70 - 99 mg/dL  GLUCOSE, CAPILLARY     Status: Abnormal   Collection Time    07/11/13  1:36 PM      Result Value Range   Glucose-Capillary 158 (*) 70 - 99 mg/dL   Comment 1 Notify RN    GLUCOSE, CAPILLARY     Status: Abnormal   Collection Time    07/11/13  3:39 PM      Result Value Range   Glucose-Capillary 167 (*) 70 - 99 mg/dL  POTASSIUM     Status: None   Collection Time    07/11/13  3:50 PM      Result Value Range   Potassium 4.0  3.5 - 5.1 mEq/L  MAGNESIUM     Status: None    Collection Time    07/11/13  3:50 PM      Result Value Range   Magnesium 1.7  1.5 - 2.5 mg/dL    Ct Abdomen Pelvis Wo Contrast  07/09/2013   CLINICAL DATA:  Abdominal pain. Nausea. Diarrhea. Acute renal failure.  EXAM: CT ABDOMEN AND PELVIS WITHOUT CONTRAST  TECHNIQUE: Multidetector CT imaging of the abdomen and pelvis was performed following the standard protocol without intravenous contrast.  COMPARISON:  05/20/2007  FINDINGS: Surgical clips noted from prior cholecystectomy. Noncontrast images of the liver, spleen, pancreas, and adrenal glands are normal in appearance. No evidence of renal calculi or hydronephrosis.  No soft tissue masses or lymphadenopathy identified within the abdomen or pelvis. Prior hysterectomy noted. Adnexal regions are unremarkable. Foley catheter seen within the bladder, and a pessary noted in the vagina. Sigmoid diverticulosis is noted, however there is no evidence of diverticulitis. No evidence of inflammatory process or abnormal fluid collections.  IMPRESSION: No acute findings.  No evidence of hydronephrosis.  Diverticulosis. No radiographic evidence of diverticulitis.   Electronically Signed   By: Myles Rosenthal M.D.   On: 07/09/2013 22:42   Dg Chest Port 1 View  07/11/2013   CLINICAL DATA:  Hypoxia.  EXAM: PORTABLE CHEST - 1 VIEW  COMPARISON:  07/09/2013.  FINDINGS: Right lower lobe infiltrate has cleared. Minimal basilar atelectasis present. Heart size is stable. Pulmonary vascularity is normal. Right shoulder replacement. Degenerative changes left shoulder.  IMPRESSION: Previously identified right lower lobe infiltrate has cleared. Mild basilar atelectasis.   Electronically Signed   By: Maisie Fus  Register   On: 07/11/2013 15:04    Review of Systems  Constitutional: Negative for fever, chills, weight loss, malaise/fatigue and diaphoresis.  HENT: Negative for hearing loss and tinnitus.   Eyes: Negative for double vision.  Respiratory: Positive for shortness of breath.  Negative for cough, hemoptysis, sputum production and wheezing.   Cardiovascular: Positive for palpitations. Negative for chest pain, orthopnea, claudication, leg swelling  and PND.  Gastrointestinal: Positive for nausea, vomiting and diarrhea. Negative for heartburn, abdominal pain, constipation, blood in stool and melena.  Genitourinary: Negative for dysuria.  Musculoskeletal: Negative for myalgias and neck pain.  Skin: Negative for itching and rash.  Neurological: Positive for weakness. Negative for dizziness, tingling and headaches.  Psychiatric/Behavioral: Negative for depression and suicidal ideas.   Blood pressure 102/50, pulse 104, temperature 98 F (36.7 C), temperature source Oral, resp. rate 14, height 5' 0.25" (1.53 m), weight 82.4 kg (181 lb 10.5 oz), SpO2 95.00%. Physical Exam  Constitutional: She is oriented to person, place, and time. She appears well-developed and well-nourished. No distress.  HENT:  Head: Normocephalic and atraumatic.  Eyes: EOM are normal. Right eye exhibits no discharge. Left eye exhibits no discharge. No scleral icterus.  Neck: No JVD present. No tracheal deviation present.  Cardiovascular: Exam reveals no friction rub.   Murmur heard. Irregularly irregular rhythm with 2/6 holosystolic murmur at apex  Respiratory: No stridor. No respiratory distress. She has no wheezes. She has no rales. She exhibits no tenderness.  GI: She exhibits no distension. There is no tenderness. There is no rebound.  Musculoskeletal: She exhibits no edema and no tenderness.  Neurological: She is alert and oriented to person, place, and time.  Skin: No rash noted. She is not diaphoretic. No erythema.  Psychiatric: She has a normal mood and affect.    Assessment/Plan:  Atrial Fibrillation with rapid ventricular response rate.  Currently on Amiodarone 200 mg qd and Cardizem 240mg /day for rate control but not on anti-coagulation due to history of multiple falls in the past. Per  primary team's request, may discontinue Amiodarone to see if her nausea gets better.  Since we anticipate that her heart rate is likely going to increase after cessation of Amiodarone, may increase Cardizem as tolerated by blood pressure for rate control. May consider adding Metoprolol as tolerated by blood pressure to keep heart rate less than 110 bpm. If she is not having chest pain (none recently) may consider discontinuing Imdur to prevent hypotension when Cardizem/beta-blockers are titrated up.  Cannot recommend Digoxin at this time due to renal failure with serum creatinine of 7.24.  If she does not respond to medication as described above, may consider TEE/cardioversion and/or AFIB ablation as last resort if/when she failed medical therapy. May check free TSH/T4 levels since she is on Synthroid.  Jezabelle Chisolm E 07/11/2013, 8:29 PM

## 2013-07-11 NOTE — Progress Notes (Addendum)
[facility] Progress Note [place] TEAM 1 - Stepdown/ICU TEAM   Dally Oshel Braatz ZOX:[id number] DOB: 1934/02/11 DOA: 07/09/2013 PCP: Gweneth Dimitri, MD  Brief narrative: This is a 79 year old female with the past medical history of grade 2 diastolic congestive heart failure and right-sided pneumonia last month who presents with 3 weeks of nausea and vomiting and is found to have hypotension and acute renal failure on arrival. Apparently as H&P states, patient was found to have a UTI at the nursing facility and started on Macrobid a few days ago.  Assessment/Plan: Principal Problem:   Acute renal failure on CKD stage III - Prerenal and ATN - due to use of Lasix, lisinopril and colchicine along with dehydration from nausea and vomiting - Continuing to make urine with continued improvement in creatinine  -Hydrate carefully as patient has significant diastolic heart failure - switch from Bicarb infusion to NS today as acidosis and renal function improving   Active Problems:    Nausea vomiting and diarrhea - Question gastroenteritis-CT of the abdomen without contrast is negative -GI pathogen panel is pending -C. difficile is negative -If no cause found and symptoms do not resolve will consider in consult and GI - stool now more formed than before- attempt to advance to full liquids.   Diastolic CHF- Grade 2 - follow fluid balance carefully while hydrating for ARF, poor PO intake & diarrhea  Electrolyte Abnormalities - cont to replace and follow closely     Protein-calorie malnutrition, severe -Poor by mouth intake currently due to above    HYPOTHYROIDISM -Continue Synthroid    DM  -Low-dose insulin sliding scale for now due to poor by mouth intake    Atrial fibrillation-chronic - Rate is currently uncontrolled- will consult cardiology for further management. I fear that she will need Amio to be discontinued due to continued GI issues -Amio was initiated in Oct and  decreased on 11/17 due to nausea -Takes Plavix which has been resumed     Metabolic acidosis -Due to acute renal failure and diarrhea and now resolved as renal function improved  COPD on chronic oxygen with secondary pulmonary hypertension -Currently no exacerbation  Obstructive sleep apnea  Gout -hold colchicine   Code Status: Full code Family Communication: None Disposition Plan: Follow in step down unit today  Consultants: None  Procedures: None  Antibiotics: none  DVT prophylaxis: heparin  HPI/Subjective: Patient alert and attempting to eat a liquid diet. States she is mildly nauseated. No significant abdominal pain. Per RN she is having watery stools.   Objective: Blood pressure 122/68, pulse 119, temperature 97.9 F (36.6 C), temperature source Oral, resp. rate 15, height 5' 0.25" (1.53 m), weight 82.4 kg (181 lb 10.5 oz), SpO2 97.00%.  Intake/Output Summary (Last 24 hours) at 07/11/13 1100 Last data filed at 07/11/13 0800  Gross per 24 hour  Intake   2420 ml  Output   1525 ml  Net    895 ml     Exam: General: No acute respiratory distress Lungs: Clear to auscultation bilaterally without wheezes or crackles Cardiovascular: Iregular rate and rhythm without murmur gallop or rub normal S1 and S2- HR in 90s to low 100s Abdomen: Mild tenderness in epigastrium, nondistended, soft, bowel sounds positive, no rebound, no ascites, no appreciable mass Extremities: No significant cyanosis, clubbing, or edema bilateral lower extremities  Data Reviewed: Basic Metabolic Panel:  Recent Labs Lab 07/09/13 1542 07/09/13 2230 07/10/13 0435 07/11/13 0504  NA 128*  --  131* 133*  K 4.6  --  4.2 3.0*  CL 90*  --  92* 93*  CO2 10*  --  10* 27  GLUCOSE 112*  --  154* 100*  BUN 116*  --  114* 95*  CREATININE 7.66* 7.24* 7.18* 4.22*  CALCIUM 7.6*  --  6.4* 6.0*  MG  --   --   --  1.2*   Liver Function Tests:  Recent Labs Lab 07/09/13 1542 07/10/13 0435  AST  24 19  ALT 33 25  ALKPHOS 150* 120*  BILITOT 0.2* 0.2*  PROT 7.2 5.7*  ALBUMIN 3.1* 2.4*   No results found for this basename: LIPASE, AMYLASE,  in the last 168 hours No results found for this basename: AMMONIA,  in the last 168 hours CBC:  Recent Labs Lab 07/09/13 1542 07/09/13 2230 07/10/13 0435 07/11/13 0504  WBC 9.2 10.1 7.1 4.8  NEUTROABS 7.3  --  5.7  --   HGB 11.6* 10.3* 9.1* 8.7*  HCT 33.5* 30.3* 26.7* 24.7*  MCV 84.8 87.1 85.9 83.4  PLT 313 295 267 226   Cardiac Enzymes:  Recent Labs Lab 07/09/13 2230  CKTOTAL 57  TROPONINI <0.30   BNP (last 3 results)  Recent Labs  06/07/13 1300 06/13/13 0406  PROBNP 3554.0* 1503.0*   CBG:  Recent Labs Lab 07/10/13 2356 07/11/13 0228 07/11/13 0404 07/11/13 0603 07/11/13 0751  GLUCAP 137* 139* 126* 82 78    Recent Results (from the past 240 hour(s))  CULTURE, BLOOD (ROUTINE X 2)     Status: None   Collection Time    07/09/13  1:21 AM      Result Value Range Status   Specimen Description BLOOD RIGHT ARM   Final   Special Requests BOTTLES DRAWN AEROBIC AND ANAEROBIC 10CC EACH   Final   Culture  Setup Time     Final   Value: 07/10/2013 08:57     Performed at [facility]   Culture     Final   Value:        BLOOD CULTURE RECEIVED NO GROWTH TO DATE CULTURE WILL BE HELD FOR 5 DAYS BEFORE ISSUING A FINAL NEGATIVE REPORT     Performed at [facility]   Report Status PENDING   Incomplete  CLOSTRIDIUM DIFFICILE BY PCR     Status: None   Collection Time    07/09/13  4:16 PM      Result Value Range Status   C difficile by pcr NEGATIVE  NEGATIVE Final  URINE CULTURE     Status: None   Collection Time    07/09/13 10:40 PM      Result Value Range Status   Specimen Description URINE, CATHETERIZED   Final   Special Requests NONE   Final   Culture  Setup Time     Final   Value: 07/10/2013 09:06     Performed at [facility] Count     Final   Value: NO GROWTH     Performed at  [facility]   Culture     Final   Value: NO GROWTH     Performed at [facility]   Report Status 07/11/2013 FINAL   Final  MRSA PCR SCREENING     Status: None   Collection Time    07/09/13 10:40 PM      Result Value Range Status   MRSA by PCR NEGATIVE  NEGATIVE Final   Comment:  The GeneXpert MRSA Assay (FDA     approved for NASAL specimens     only), is one component of a     comprehensive MRSA colonization     surveillance program. It is not     intended to diagnose MRSA     infection nor to guide or     monitor treatment for     MRSA infections.  CULTURE, BLOOD (ROUTINE X 2)     Status: None   Collection Time    07/10/13  1:14 AM      Result Value Range Status   Specimen Description BLOOD LEFT HAND   Final   Special Requests BOTTLES DRAWN AEROBIC AND ANAEROBIC 10CC EACH   Final   Culture  Setup Time     Final   Value: 07/10/2013 08:57     Performed at [facility]   Culture     Final   Value:        BLOOD CULTURE RECEIVED NO GROWTH TO DATE CULTURE WILL BE HELD FOR 5 DAYS BEFORE ISSUING A FINAL NEGATIVE REPORT     Performed at [facility]   Report Status PENDING   Incomplete     Studies:  Recent x-ray studies have been reviewed in detail by the Attending Physician  Scheduled Meds:  Scheduled Meds: . amiodarone  200 mg Oral Daily  . azelastine  2 spray Each Nare Daily  . budesonide-formoterol  2 puff Inhalation BID  . clopidogrel  75 mg Oral Daily  . diltiazem  240 mg Oral Daily  . feeding supplement (RESOURCE BREEZE)  1 Container Oral TID BM  . heparin  5,000 Units Subcutaneous Q8H  . insulin aspart  0-9 Units Subcutaneous TID WC  . levothyroxine  25 mcg Oral Custom  . levothyroxine  50 mcg Oral Custom  . LORazepam  0.25 mg Oral TID  . magnesium sulfate 1 - 4 g bolus IVPB  2 g Intravenous Once  . OxyCODONE  20 mg Oral Q12H  . ramelteon  8 mg Oral QHS  . sodium chloride  3 mL Intravenous Q12H   Continuous  Infusions: .  sodium bicarbonate  infusion 1000 mL 125 mL/hr at 07/11/13 0418    Time spent on care of this patient: 35 minute   Calvert Cantor, MD  [facility] Office  [phone] Pager - Text Page per Loretha Stapler as per below:  On-Call/Text Page:      [email or web address]      password [email or web address]  If 7PM-7AM, please contact night-coverage [email or web address] Password [email or web address] 07/11/2013, 11:00 AM   LOS: 2 days    I have examined the patient, reviewed the chart and modified the above note which I agree with.   Langley Flatley,MD [phone] 07/22/2013, 5:11 PM

## 2013-07-11 NOTE — Progress Notes (Signed)
CRITICAL VALUE ALERT  Critical value received:  Ca++ 6.0  Date of notification:  07/11/13  Time of notification:  0557  Critical value read back:yes  Nurse who received alert:  Beryle Quant, RN  MD notified (1st page):  Elray Mcgregor of [facility]  Time of first page:  [phone]  MD notified (2nd page):  Time of second page:  Responding MD:  Lamonte Sakai of [facility]  Time MD responded:  0600  Orders received for replacement. Will continue to monitor and assess.

## 2013-07-12 DIAGNOSIS — N19 Unspecified kidney failure: Secondary | ICD-10-CM

## 2013-07-12 DIAGNOSIS — I2510 Atherosclerotic heart disease of native coronary artery without angina pectoris: Secondary | ICD-10-CM

## 2013-07-12 LAB — GI PATHOGEN PANEL BY PCR, STOOL
C difficile toxin A/B: NEGATIVE
Cryptosporidium by PCR: NEGATIVE
E coli (ETEC) LT/ST: NEGATIVE
E coli 0157 by PCR: NEGATIVE
G lamblia by PCR: NEGATIVE
Salmonella by PCR: POSITIVE

## 2013-07-12 LAB — RENAL FUNCTION PANEL
Albumin: 2.4 g/dL — ABNORMAL LOW (ref 3.5–5.2)
BUN: 76 mg/dL — ABNORMAL HIGH (ref 6–23)
Calcium: 7.2 mg/dL — ABNORMAL LOW (ref 8.4–10.5)
GFR calc Af Amer: 20 mL/min — ABNORMAL LOW (ref >90)
Glucose, Bld: 125 mg/dL — ABNORMAL HIGH (ref 70–99)
Phosphorus: 4.2 mg/dL (ref 2.3–4.6)
Potassium: 3.9 mEq/L (ref 3.5–5.1)
Sodium: 137 mEq/L (ref 135–145)

## 2013-07-12 LAB — GLUCOSE, CAPILLARY
Glucose-Capillary: 87 mg/dL (ref 70–99)
Glucose-Capillary: 126 mg/dL — ABNORMAL HIGH (ref 70–99)
Glucose-Capillary: 119 mg/dL — ABNORMAL HIGH (ref 70–99)

## 2013-07-12 LAB — CBC
HCT: 25.7 % — ABNORMAL LOW (ref 36.0–46.0)
MCHC: 34.6 g/dL (ref 30.0–36.0)
MCV: 86 fL (ref 78.0–100.0)
RBC: 2.99 MIL/uL — ABNORMAL LOW (ref 3.87–5.11)
WBC: 5.2 10*3/uL (ref 4.0–10.5)

## 2013-07-12 LAB — TSH: TSH: 2.997 u[IU]/mL (ref 0.350–4.500)

## 2013-07-12 MED ORDER — LORAZEPAM 0.5 MG PO TABS
0.2500 mg | ORAL_TABLET | Freq: Three times a day (TID) | ORAL | Status: DC | PRN
  Administered 2013-07-13: 0.25 mg via ORAL
  Filled 2013-07-12: qty 1

## 2013-07-12 MED ORDER — LOPERAMIDE HCL 2 MG PO CAPS
2.0000 mg | ORAL_CAPSULE | ORAL | Status: DC | PRN
  Filled 2013-07-12: qty 1

## 2013-07-12 MED ORDER — OXYCODONE HCL ER 10 MG PO T12A
20.0000 mg | EXTENDED_RELEASE_TABLET | Freq: Every evening | ORAL | Status: DC | PRN
  Administered 2013-07-15: 20 mg via ORAL
  Filled 2013-07-12: qty 2

## 2013-07-12 NOTE — Progress Notes (Signed)
[facility] Progress Note [place] TEAM 1 - Stepdown/ICU TEAM   Lily Velasquez Capaldi ZOX:[id number] DOB: 08-04-1934 DOA: 07/09/2013 PCP: Gweneth Dimitri, MD  Brief narrative: 79 year old female with the past medical history of grade 2 diastolic congestive heart failure and right-sided pneumonia last month who presented with 3 weeks of nausea and vomiting and was found to have hypotension and acute renal failure on arrival. Apparently patient was found to have a UTI at the nursing facility and started on Macrobid a few days prior to her admit.    HPI: Pt feels much better today.  Denies n/v, or diarrhea.  Has tolerated her full liquid diet w/o difficulty.  Deneis cp, sob, f/c, or abdom pain.    Assessment/Plan:  Acute renal failure on CKD stage III - Prerenal and ATN due to use of lasix, lisinopril and colchicine along with dehydration from nausea and vomiting - continuing to make urine with continued improvement in creatinine  - hydrated carefully as patient has significant diastolic heart failure - baseline crt appears to be 1.4 - 1.7   Nausea vomiting and diarrhea - CT of the abdomen without contrast is negative - GI pathogen panel still pending - C. difficile negative - improved coincident with d/c of amiodarone  Diastolic CHF - Grade 2 - well compensated at present   Protein-calorie malnutrition, severe -intake improving - advance to regular diet and follow   HYPOTHYROIDISM - Continue Synthroid - TSH/FT4 pending   DM  -Low-dose insulin sliding scale for now due to poor by mouth intake - CBG is well controlled at this time   Atrial fibrillation - chronic - Rate control remains sub-optimal  - Cardiology following and addressing possible need to stop amio due to possible GI side effects - amio now stopped  - takes Plavix which has been resumed - not on anti-coagulation due to history of multiple falls  Metabolic acidosis -Due to acute renal failure and diarrhea -  resolved as renal function improved  COPD on chronic oxygen with secondary pulmonary hypertension -Currently no exacerbation  Obstructive sleep apnea  Gout -hold colchicine  Code Status: FULL Family Communication: spoke w/ pt and husband at bedside at length  Disposition Plan: transfer to tele bed (cardiac unit)  Consultants: Cardiology  Procedures: None  Antibiotics: none  DVT prophylaxis: SQ heparin  Objective: Blood pressure 117/49, pulse 92, temperature 97.7 F (36.5 C), temperature source Oral, resp. rate 15, height 5' 0.25" (1.53 m), weight 85.1 kg (187 lb 9.8 oz), SpO2 99.00%.  Intake/Output Summary (Last 24 hours) at 07/12/13 1508 Last data filed at 07/12/13 1330  Gross per 24 hour  Intake   1800 ml  Output   1682 ml  Net    118 ml   Exam: General: No acute respiratory distress Lungs: Clear to auscultation bilaterally without wheezes or crackles Cardiovascular: Iregular rhythm without murmur gallop or rub normal S1 and S2 - HR in 90s to low 100s Abdomen: Mild tenderness in epigastrium, nondistended, soft, bowel sounds positive, no rebound, no ascites, no appreciable mass Extremities: No significant cyanosis, clubbing, or edema bilateral lower extremities  Data Reviewed: Basic Metabolic Panel:  Recent Labs Lab 07/09/13 1542 07/09/13 2230 07/10/13 0435 07/11/13 0504 07/11/13 1550 07/12/13 0410  NA 128*  --  131* 133*  --  137  K 4.6  --  4.2 3.0* 4.0 3.9  CL 90*  --  92* 93*  --  100  CO2 10*  --  10* 27  --  28  GLUCOSE 112*  --  154* 100*  --  125*  BUN 116*  --  114* 95*  --  76*  CREATININE 7.66* 7.24* 7.18* 4.22*  --  2.56*  CALCIUM 7.6*  --  6.4* 6.0*  --  7.2*  MG  --   --   --  1.2* 1.7 1.7  PHOS  --   --   --   --   --  4.2   Liver Function Tests:  Recent Labs Lab 07/09/13 1542 07/10/13 0435 07/12/13 0410  AST 24 19  --   ALT 33 25  --   ALKPHOS 150* 120*  --   BILITOT 0.2* 0.2*  --   PROT 7.2 5.7*  --   ALBUMIN 3.1* 2.4*  2.4*   CBC:  Recent Labs Lab 07/09/13 1542 07/09/13 2230 07/10/13 0435 07/11/13 0504 07/12/13 0410  WBC 9.2 10.1 7.1 4.8 5.2  NEUTROABS 7.3  --  5.7  --   --   HGB 11.6* 10.3* 9.1* 8.7* 8.9*  HCT 33.5* 30.3* 26.7* 24.7* 25.7*  MCV 84.8 87.1 85.9 83.4 86.0  PLT 313 295 267 226 213   Cardiac Enzymes:  Recent Labs Lab 07/09/13 2230  CKTOTAL 57  TROPONINI <0.30   BNP (last 3 results)  Recent Labs  06/07/13 1300 06/13/13 0406  PROBNP 3554.0* 1503.0*   CBG:  Recent Labs Lab 07/11/13 1336 07/11/13 1539 07/11/13 2132 07/12/13 0800 07/12/13 1235  GLUCAP 158* 167* 126* 87 126*    Recent Results (from the past 240 hour(s))  CULTURE, BLOOD (ROUTINE X 2)     Status: None   Collection Time    07/09/13  1:21 AM      Result Value Range Status   Specimen Description BLOOD RIGHT ARM   Final   Special Requests BOTTLES DRAWN AEROBIC AND ANAEROBIC 10CC EACH   Final   Culture  Setup Time     Final   Value: 07/10/2013 08:57     Performed at [facility]   Culture     Final   Value:        BLOOD CULTURE RECEIVED NO GROWTH TO DATE CULTURE WILL BE HELD FOR 5 DAYS BEFORE ISSUING A FINAL NEGATIVE REPORT     Performed at [facility]   Report Status PENDING   Incomplete  CLOSTRIDIUM DIFFICILE BY PCR     Status: None   Collection Time    07/09/13  4:16 PM      Result Value Range Status   C difficile by pcr NEGATIVE  NEGATIVE Final  URINE CULTURE     Status: None   Collection Time    07/09/13 10:40 PM      Result Value Range Status   Specimen Description URINE, CATHETERIZED   Final   Special Requests NONE   Final   Culture  Setup Time     Final   Value: 07/10/2013 09:06     Performed at [facility] Count     Final   Value: NO GROWTH     Performed at [facility]   Culture     Final   Value: NO GROWTH     Performed at [facility]   Report Status 07/11/2013 FINAL   Final  MRSA PCR SCREENING     Status: None    Collection Time    07/09/13 10:40 PM      Result Value Range Status   MRSA  by PCR NEGATIVE  NEGATIVE Final   Comment:            The GeneXpert MRSA Assay (FDA     approved for NASAL specimens     only), is one component of a     comprehensive MRSA colonization     surveillance program. It is not     intended to diagnose MRSA     infection nor to guide or     monitor treatment for     MRSA infections.  CULTURE, BLOOD (ROUTINE X 2)     Status: None   Collection Time    07/10/13  1:14 AM      Result Value Range Status   Specimen Description BLOOD LEFT HAND   Final   Special Requests BOTTLES DRAWN AEROBIC AND ANAEROBIC 10CC EACH   Final   Culture  Setup Time     Final   Value: 07/10/2013 08:57     Performed at [facility]   Culture     Final   Value:        BLOOD CULTURE RECEIVED NO GROWTH TO DATE CULTURE WILL BE HELD FOR 5 DAYS BEFORE ISSUING A FINAL NEGATIVE REPORT     Performed at [facility]   Report Status PENDING   Incomplete     Studies:  Recent x-ray studies have been reviewed in detail by the Attending Physician  Scheduled Meds:  Scheduled Meds: . azelastine  2 spray Each Nare Daily  . budesonide-formoterol  2 puff Inhalation BID  . clopidogrel  75 mg Oral Daily  . diltiazem  240 mg Oral Daily  . feeding supplement (RESOURCE BREEZE)  1 Container Oral TID BM  . heparin  5,000 Units Subcutaneous Q8H  . insulin aspart  0-9 Units Subcutaneous TID WC  . levothyroxine  25 mcg Oral Custom  . levothyroxine  50 mcg Oral Custom  . LORazepam  0.25 mg Oral TID  . OxyCODONE  20 mg Oral Q12H  . ramelteon  8 mg Oral QHS  . sodium chloride  3 mL Intravenous Q12H    Time spent on care of this patient: 35 minute   Vertie Dibbern T, MD  [facility] Office  [phone] Pager - Text Page per Loretha Stapler as per below:  On-Call/Text Page:      [email or web address]      password [email or web address]  If 7PM-7AM, please contact night-coverage [email or web address] Password  [email or web address] 07/12/2013, 3:08 PM   LOS: 3 days

## 2013-07-12 NOTE — Progress Notes (Signed)
Report called to Inetta Fermo, RN, all questions answered.  No complaints.

## 2013-07-12 NOTE — Progress Notes (Signed)
SUBJECTIVE:  Complains of cough with mild congestion.  No nausea today  OBJECTIVE:   Vitals:   Filed Vitals:   07/12/13 0738 07/12/13 0739 07/12/13 0800 07/12/13 0903  BP:      Pulse: 109 91 92   Temp:   98.2 F (36.8 C)   TempSrc:   Oral   Resp: 17 23 12    Height:      Weight:      SpO2: 97% 98% 97% 98%   I&O's:   Intake/Output Summary (Last 24 hours) at 07/12/13 0930 Last data filed at 07/12/13 0900  Gross per 24 hour  Intake   2450 ml  Output   1583 ml  Net    867 ml   TELEMETRY: Reviewed telemetry pt in atrial fibrillation wt 90-100bpm:     PHYSICAL EXAM General: Well developed, well nourished, in no acute distress Head: Eyes PERRLA, No xanthomas.   Normal cephalic and atramatic  Lungs:   Clear bilaterally to auscultation and percussion. Heart:   Irregularly irregular S1 S2 Pulses are 2+ & equal. Abdomen: Bowel sounds are positive, abdomen soft and non-tender without masses  Extremities:   No clubbing, cyanosis or edema.  DP +1 Neuro: Alert and oriented X 3. Psych:  Good affect, responds appropriately   LABS: Basic Metabolic Panel:  Recent Labs  16/10/96 0504 07/11/13 1550 07/12/13 0410  NA 133*  --  137  K 3.0* 4.0 3.9  CL 93*  --  100  CO2 27  --  28  GLUCOSE 100*  --  125*  BUN 95*  --  76*  CREATININE 4.22*  --  2.56*  CALCIUM 6.0*  --  7.2*  MG 1.2* 1.7 1.7  PHOS  --   --  4.2   Liver Function Tests:  Recent Labs  07/09/13 1542 07/10/13 0435 07/12/13 0410  AST 24 19  --   ALT 33 25  --   ALKPHOS 150* 120*  --   BILITOT 0.2* 0.2*  --   PROT 7.2 5.7*  --   ALBUMIN 3.1* 2.4* 2.4*   No results found for this basename: LIPASE, AMYLASE,  in the last 72 hours CBC:  Recent Labs  07/09/13 1542  07/10/13 0435 07/11/13 0504 07/12/13 0410  WBC 9.2  < > 7.1 4.8 5.2  NEUTROABS 7.3  --  5.7  --   --   HGB 11.6*  < > 9.1* 8.7* 8.9*  HCT 33.5*  < > 26.7* 24.7* 25.7*  MCV 84.8  < > 85.9 83.4 86.0  PLT 313  < > 267 226 213  < > = values  in this interval not displayed. Cardiac Enzymes:  Recent Labs  07/09/13 2230  CKTOTAL 57  TROPONINI <0.30   BNP: No components found with this basename: POCBNP,  D-Dimer: No results found for this basename: DDIMER,  in the last 72 hours Hemoglobin A1C: No results found for this basename: HGBA1C,  in the last 72 hours Fasting Lipid Panel: No results found for this basename: CHOL, HDL, LDLCALC, TRIG, CHOLHDL, LDLDIRECT,  in the last 72 hours Thyroid Function Tests:  Recent Labs  07/09/13 2230  TSH 3.717   Anemia Panel: No results found for this basename: VITAMINB12, FOLATE, FERRITIN, TIBC, IRON, RETICCTPCT,  in the last 72 hours Coag Panel:   Lab Results  Component Value Date   INR 1.19 06/10/2013   INR 1.26 06/09/2013   INR 1.39 06/08/2013    RADIOLOGY: Ct Abdomen Pelvis Wo Contrast  07/09/2013   CLINICAL DATA:  Abdominal pain. Nausea. Diarrhea. Acute renal failure.  EXAM: CT ABDOMEN AND PELVIS WITHOUT CONTRAST  TECHNIQUE: Multidetector CT imaging of the abdomen and pelvis was performed following the standard protocol without intravenous contrast.  COMPARISON:  05/20/2007  FINDINGS: Surgical clips noted from prior cholecystectomy. Noncontrast images of the liver, spleen, pancreas, and adrenal glands are normal in appearance. No evidence of renal calculi or hydronephrosis.  No soft tissue masses or lymphadenopathy identified within the abdomen or pelvis. Prior hysterectomy noted. Adnexal regions are unremarkable. Foley catheter seen within the bladder, and a pessary noted in the vagina. Sigmoid diverticulosis is noted, however there is no evidence of diverticulitis. No evidence of inflammatory process or abnormal fluid collections.  IMPRESSION: No acute findings.  No evidence of hydronephrosis.  Diverticulosis. No radiographic evidence of diverticulitis.   Electronically Signed   By: Myles Rosenthal M.D.   On: 07/09/2013 22:42   Dg Chest 2 View  07/09/2013   CLINICAL DATA:   Abdominal pain with nausea and weakness, recent history of pneumonia  EXAM: CHEST  2 VIEW  COMPARISON:  CT scan of the chest of June 07, 2013.  FINDINGS: The lungs are well-expanded. The posterior right lower lobe infiltrate demonstrated on the previous CT scan is not evident on this chest x-ray. The cardiac silhouette is top-normal in size. The pulmonary vascularity is not engorged. There is no pleural effusion. There is no pneumothorax. There is mild loss of height of multiple mid and lower thoracic vertebral bodies without evidence of high-grade which compression. The observed portions of the upper abdomen reveal no definite abnormal gas collection.  IMPRESSION: 1. There is no evidence of residual pneumonia in the right lower lobe. 2. There is borderline enlargement of cardiac silhouette without overt evidence of CHF.   Electronically Signed   By: David  [place]   On: 07/09/2013 16:49   Dg Chest Port 1 View  07/11/2013   CLINICAL DATA:  Hypoxia.  EXAM: PORTABLE CHEST - 1 VIEW  COMPARISON:  07/09/2013.  FINDINGS: Right lower lobe infiltrate has cleared. Minimal basilar atelectasis present. Heart size is stable. Pulmonary vascularity is normal. Right shoulder replacement. Degenerative changes left shoulder.  IMPRESSION: Previously identified right lower lobe infiltrate has cleared. Mild basilar atelectasis.   Electronically Signed   By: Maisie Fus  Register   On: 07/11/2013 15:04   Assessment/Plan:  1.Atrial Fibrillation with rapid ventricular response rate. Currently on Amiodarone 200 mg qd and Cardizem 240mg /day for rate control but not on anti-coagulation due to history of multiple falls in the past.  - Per primary team's request, will stop amiodarone to see if her nausea gets better. Since we anticipate that her heart rate is likely going to increase after cessation of Amiodarone, may need to increase Cardizem as tolerated by blood pressure for rate control. May consider adding Metoprolol as tolerated by  blood pressure to keep heart rate less than 110 bpm. No Digoxin at this time due to renal failure with serum creatinine of 7.24. - If she does not respond to medication as described above, may consider TEE/cardioversion and/or AFIB ablation as last resort if/when she failed medical therapy.  - check free TSH/T4 levels since she is on Synthroid.  2.  Nausea and vomiting secondary to amiodarone - resolved this ma 3.  Acute on chronic renal insuff secondary to #2       Quintella Reichert, MD  07/12/2013  9:30 AM

## 2013-07-13 DIAGNOSIS — J441 Chronic obstructive pulmonary disease with (acute) exacerbation: Secondary | ICD-10-CM

## 2013-07-13 DIAGNOSIS — I5033 Acute on chronic diastolic (congestive) heart failure: Secondary | ICD-10-CM

## 2013-07-13 LAB — GLUCOSE, CAPILLARY
Glucose-Capillary: 104 mg/dL — ABNORMAL HIGH (ref 70–99)
Glucose-Capillary: 137 mg/dL — ABNORMAL HIGH (ref 70–99)
Glucose-Capillary: 129 mg/dL — ABNORMAL HIGH (ref 70–99)
Glucose-Capillary: 130 mg/dL — ABNORMAL HIGH (ref 70–99)

## 2013-07-13 LAB — RENAL FUNCTION PANEL
Albumin: 2.4 g/dL — ABNORMAL LOW (ref 3.5–5.2)
BUN: 50 mg/dL — ABNORMAL HIGH (ref 6–23)
CO2: 26 mEq/L (ref 19–32)
Creatinine, Ser: 1.53 mg/dL — ABNORMAL HIGH (ref 0.50–1.10)
Glucose, Bld: 145 mg/dL — ABNORMAL HIGH (ref 70–99)
Phosphorus: 3.1 mg/dL (ref 2.3–4.6)
Potassium: 4.3 mEq/L (ref 3.5–5.1)

## 2013-07-13 MED ORDER — CIPROFLOXACIN HCL 500 MG PO TABS
500.0000 mg | ORAL_TABLET | Freq: Two times a day (BID) | ORAL | Status: DC
  Administered 2013-07-13 – 2013-07-15 (×5): 500 mg via ORAL
  Filled 2013-07-13 (×7): qty 1

## 2013-07-13 MED ORDER — LORAZEPAM 0.5 MG PO TABS
0.5000 mg | ORAL_TABLET | Freq: Four times a day (QID) | ORAL | Status: DC | PRN
  Administered 2013-07-13 – 2013-07-15 (×7): 0.5 mg via ORAL
  Filled 2013-07-13 (×7): qty 1

## 2013-07-13 MED ORDER — DILTIAZEM HCL ER COATED BEADS 300 MG PO CP24
300.0000 mg | ORAL_CAPSULE | Freq: Every day | ORAL | Status: DC
  Administered 2013-07-13 – 2013-07-14 (×2): 300 mg via ORAL
  Filled 2013-07-13 (×2): qty 1

## 2013-07-13 MED ORDER — METOPROLOL TARTRATE 25 MG PO TABS
25.0000 mg | ORAL_TABLET | Freq: Two times a day (BID) | ORAL | Status: DC
  Administered 2013-07-13 – 2013-07-14 (×3): 25 mg via ORAL
  Filled 2013-07-13 (×4): qty 1

## 2013-07-13 NOTE — Progress Notes (Signed)
Patient requested Ativan .25 for anxiety. Liquid stools continue. No complaints of pain.

## 2013-07-13 NOTE — Evaluation (Signed)
Physical Therapy Evaluation Patient Details Name: [name] MRN: [id number] DOB: [birth year] Today's Date: 07/13/2013 Time: 4098-1191 PT Time Calculation (min): 25 min  PT Assessment / Plan / Recommendation History of Present Illness  [name] is a 79 y.o. female who was recently admitted last month for CHF/pneumonia and discharged to nursing home has been having persistent nausea vomiting and diarrhea over the last 3 weeks. Last few days patient has been become very weak and was brought to the ER. Initially patient was found to be hypotensive and blood pressure improved with 1 L normal saline bolus. Patient's labs show elevated creatinine of around 7 with metabolic acidosis which is acute with creatinine of 1.8 last month. Patient also was found to have UTI in the nursing home and was placed on Macrobid few days ago. Patient has been having recurrent nausea vomiting and diarrhea with abdominal cramps over the last 3 weeks. Denies any fever chills chest pain shortness of breath. Patient has been admitted for further management.  Clinical Impression  Pt limited by anxiety, requires cues for deep breathing.  Pt able to gait short distances and perform tolieting at min A to min guard level with RW.  Pt will benefit from continued PT services to increase functional independence for safe d/c home.  Pt will benefit from HHPT services and 24 hour assistance at home due to weakness and cardiopulmonary status limitations.    PT Assessment  Patient needs continued PT services    Follow Up Recommendations  Home health PT    Does the patient have the potential to tolerate intense rehabilitation      Barriers to Discharge        Equipment Recommendations  None recommended by PT    Recommendations for Other Services     Frequency Min 3X/week    Precautions / Restrictions Restrictions Weight Bearing Restrictions: No   Pertinent Vitals/Pain spO2 94% during session on 3L O2, no  c/o pain      Mobility  Bed Mobility Bed Mobility: Supine to Sit Supine to Sit: 4: Min assist;HOB elevated;With rails Details for Bed Mobility Assistance: assist for trunk Transfers Transfers: Stand to Sit;Sit to Stand Sit to Stand: 4: Min guard Stand to Sit: 4: Min guard Details for Transfer Assistance: steadying assist, increased time, cues for UE placement Ambulation/Gait Ambulation/Gait Assistance: 4: Min assist Ambulation Distance (Feet): 15 Feet Assistive device: Rolling walker Ambulation/Gait Assistance Details: pt able to gait with RW with min A for steadying, decreased cadence, decreased step and stride length    Exercises     PT Diagnosis: Difficulty walking;Generalized weakness  PT Problem List: Decreased strength;Decreased activity tolerance;Decreased balance;Decreased mobility;Cardiopulmonary status limiting activity PT Treatment Interventions: DME instruction;Therapeutic exercise;Gait training;Balance training;Functional mobility training;Therapeutic activities;Patient/family education;Neuromuscular re-education     PT Goals(Current goals can be found in the care plan section) Acute Rehab PT Goals Patient Stated Goal: go home PT Goal Formulation: With patient Time For Goal Achievement: 07/20/13 Potential to Achieve Goals: Good  Visit Information  Last PT Received On: 07/13/13 Assistance Needed: +1 History of Present Illness: [name] is a 79 y.o. female who was recently admitted last month for CHF/pneumonia and discharged to nursing home has been having persistent nausea vomiting and diarrhea over the last 3 weeks. Last few days patient has been become very weak and was brought to the ER. Initially patient was found to be hypotensive and blood pressure improved with 1 L normal saline bolus. Patient's labs show elevated creatinine  of around 7 with metabolic acidosis which is acute with creatinine of 1.8 last month. Patient also was found to have UTI in  the nursing home and was placed on Macrobid few days ago. Patient has been having recurrent nausea vomiting and diarrhea with abdominal cramps over the last 3 weeks. Denies any fever chills chest pain shortness of breath. Patient has been admitted for further management.       Prior Functioning  Home Living Family/patient expects to be discharged to:: Private residence Living Arrangements: Spouse/significant other;Other relatives Available Help at Discharge: Family;Available 24 hours/day Type of Home: House Home Access: Other (comment) (stair lift) Home Equipment: Walker - 4 wheels;Walker - standard;Tub bench;Cane - single point;Wheelchair - Careers adviser (comment) Additional Comments: pt uses RW at home Prior Function Level of Independence: Independent with assistive device(s) Communication Communication: No difficulties    Cognition  Cognition Arousal/Alertness: Awake/alert Behavior During Therapy: WFL for tasks assessed/performed;Anxious Overall Cognitive Status: Within Functional Limits for tasks assessed    Extremity/Trunk Assessment Lower Extremity Assessment Lower Extremity Assessment: Generalized weakness Cervical / Trunk Assessment Cervical / Trunk Assessment: Normal   Balance Static Standing Balance Static Standing - Balance Support: Left upper extremity supported;During functional activity Static Standing - Level of Assistance: 5: Stand by assistance Static Standing - Comment/# of Minutes: pt requires 1 UE support at all times due to fatigue/anxiety  End of Session PT - End of Session Equipment Utilized During Treatment: Gait belt Activity Tolerance: Patient limited by fatigue Patient left: in chair;with call bell/phone within reach Nurse Communication: Mobility status  GP     [name] 07/13/2013, 9:48 AM

## 2013-07-13 NOTE — Progress Notes (Signed)
SUBJECTIVE:  Complains of cough with mild congestion.  No nausea today  OBJECTIVE:   Vitals:   Filed Vitals:   07/12/13 2120 07/13/13 0622 07/13/13 0823 07/13/13 1435  BP: 134/55 117/59  116/66  Pulse: 80 118 122 106  Temp: 97.5 F (36.4 C) 98.3 F (36.8 C)  99 F (37.2 C)  TempSrc: Oral Oral  Oral  Resp: 18 20 20 20   Height:      Weight:  189 lb 9.5 oz (86 kg)    SpO2: 93% 96% 94% 93%   I&O's:    Intake/Output Summary (Last 24 hours) at 07/13/13 1918 Last data filed at 07/13/13 1736  Gross per 24 hour  Intake    480 ml  Output   1800 ml  Net  -1320 ml   TELEMETRY: Reviewed telemetry pt in atrial fibrillation wt 90-100bpm:     PHYSICAL EXAM General: Well developed, well nourished, in no acute distress Head: Eyes PERRLA, No xanthomas.   Normal cephalic and atramatic  Lungs:   Mild wheezing  bilaterally  Heart:   Irregularly irregular S1 S2 Pulses are 2+ & equal. Abdomen: Bowel sounds are positive, abdomen soft and non-tender without masses  Extremities:  Tr edema.  DP +1 Neuro: Alert and oriented X 3. Psych:  Flat affect, responds appropriately   LABS: Basic Metabolic Panel:  Recent Labs  16/10/96 1550 07/12/13 0410 07/13/13 0606  NA  --  137 138  K 4.0 3.9 4.3  CL  --  100 102  CO2  --  28 26  GLUCOSE  --  125* 145*  BUN  --  76* 50*  CREATININE  --  2.56* 1.53*  CALCIUM  --  7.2* 7.8*  MG 1.7 1.7  --   PHOS  --  4.2 3.1   Liver Function Tests:  Recent Labs  07/12/13 0410 07/13/13 0606  ALBUMIN 2.4* 2.4*   No results found for this basename: LIPASE, AMYLASE,  in the last 72 hours CBC:  Recent Labs  07/11/13 0504 07/12/13 0410  WBC 4.8 5.2  HGB 8.7* 8.9*  HCT 24.7* 25.7*  MCV 83.4 86.0  PLT 226 213   Cardiac Enzymes: No results found for this basename: CKTOTAL, CKMB, CKMBINDEX, TROPONINI,  in the last 72 hours BNP: No components found with this basename: POCBNP,  D-Dimer: No results found for this basename: DDIMER,  in the last  72 hours Hemoglobin A1C: No results found for this basename: HGBA1C,  in the last 72 hours Fasting Lipid Panel: No results found for this basename: CHOL, HDL, LDLCALC, TRIG, CHOLHDL, LDLDIRECT,  in the last 72 hours Thyroid Function Tests:  Recent Labs  07/12/13 0410  TSH 2.997   Anemia Panel: No results found for this basename: VITAMINB12, FOLATE, FERRITIN, TIBC, IRON, RETICCTPCT,  in the last 72 hours Coag Panel:   Lab Results  Component Value Date   INR 1.19 06/10/2013   INR 1.26 06/09/2013   INR 1.39 06/08/2013    RADIOLOGY: Ct Abdomen Pelvis Wo Contrast  07/09/2013   CLINICAL DATA:  Abdominal pain. Nausea. Diarrhea. Acute renal failure.  EXAM: CT ABDOMEN AND PELVIS WITHOUT CONTRAST  TECHNIQUE: Multidetector CT imaging of the abdomen and pelvis was performed following the standard protocol without intravenous contrast.  COMPARISON:  05/20/2007  FINDINGS: Surgical clips noted from prior cholecystectomy. Noncontrast images of the liver, spleen, pancreas, and adrenal glands are normal in appearance. No evidence of renal calculi or hydronephrosis.  No soft tissue masses or lymphadenopathy  identified within the abdomen or pelvis. Prior hysterectomy noted. Adnexal regions are unremarkable. Foley catheter seen within the bladder, and a pessary noted in the vagina. Sigmoid diverticulosis is noted, however there is no evidence of diverticulitis. No evidence of inflammatory process or abnormal fluid collections.  IMPRESSION: No acute findings.  No evidence of hydronephrosis.  Diverticulosis. No radiographic evidence of diverticulitis.   Electronically Signed   By: Myles Rosenthal M.D.   On: 07/09/2013 22:42   Dg Chest 2 View  07/09/2013   CLINICAL DATA:  Abdominal pain with nausea and weakness, recent history of pneumonia  EXAM: CHEST  2 VIEW  COMPARISON:  CT scan of the chest of June 07, 2013.  FINDINGS: The lungs are well-expanded. The posterior right lower lobe infiltrate demonstrated on the  previous CT scan is not evident on this chest x-ray. The cardiac silhouette is top-normal in size. The pulmonary vascularity is not engorged. There is no pleural effusion. There is no pneumothorax. There is mild loss of height of multiple mid and lower thoracic vertebral bodies without evidence of high-grade which compression. The observed portions of the upper abdomen reveal no definite abnormal gas collection.  IMPRESSION: 1. There is no evidence of residual pneumonia in the right lower lobe. 2. There is borderline enlargement of cardiac silhouette without overt evidence of CHF.   Electronically Signed   By: David  [place]   On: 07/09/2013 16:49   Dg Chest Port 1 View  07/11/2013   CLINICAL DATA:  Hypoxia.  EXAM: PORTABLE CHEST - 1 VIEW  COMPARISON:  07/09/2013.  FINDINGS: Right lower lobe infiltrate has cleared. Minimal basilar atelectasis present. Heart size is stable. Pulmonary vascularity is normal. Right shoulder replacement. Degenerative changes left shoulder.  IMPRESSION: Previously identified right lower lobe infiltrate has cleared. Mild basilar atelectasis.   Electronically Signed   By: Maisie Fus  Register   On: 07/11/2013 15:04   Assessment/Plan:  1.Atrial Fibrillation with rapid ventricular response rate. Currently on  Cardizem 300mg /day for rate control but not on anti-coagulation due to history of multiple falls in the past.  - Per primary team's request, stopped amiodarone to see if her nausea gets better. Increased Cardizem as tolerated by blood pressure for rate control. May consider adding Metoprolol as tolerated by blood pressure to keep heart rate less than 110 bpm. No Digoxin at this time due to renal failure.  She was on sotalol in the past but this was stopped due to renal insufficiency.  She was switched to amiodarone.   - Not a candidate for TEE/cardioversion and/or AFIB ablation unless she takes anticoagulation. - check free TSH/T4 levels since she is on Synthroid.  2.  Nausea and  vomiting secondary to amiodarone - better, but she still does not feel well. 3.  Acute on chronic renal insuff secondary to #2. Improving dramatically.  When she stays in AFib for too long, she has had diastolic heart failure in the past.  May need to get an EP opinion.  She has seen Dr. Johney Frame in the past.         Corky Crafts., MD  07/13/2013  7:18 PM

## 2013-07-13 NOTE — Progress Notes (Addendum)
[facility] PROGRESS NOTE Interim History: 80 year old female with the past medical history of grade 2 diastolic congestive heart failure and right-sided pneumonia last month who presented with 3 weeks of nausea and vomiting and was found to have hypotension and acute renal failure on arrival. Apparently patient was found to have a UTI at the nursing facility and started on Macrobid a few days prior to her admit  Assessment/Plan:  Acute renal failure on CKD stage III  - Prerenal and ATN due to use of lasix, lisinopril and colchicine along with dehydration from nausea and vomiting. - Resolved with hydration. Cr at baseline. - Baseline cr. appears to be 1.4 - 1.7  - Resume ACE in 2 weeks.  Nausea vomiting and diarrhea  - CT of the abdomen without contrast is negative  - Campilobacter positive. Start cipro. - C. difficile negative  - improved coincident with d/c of amiodarone   Diastolic CHF - Grade 2  - well compensated at present   Protein-calorie malnutrition, severe  -intake improving - advance to regular diet and follow   HYPOTHYROIDISM  - Continue Synthroid - TSH/FT4 pending   DM  -Low-dose insulin sliding scale for now due to poor by mouth intake - CBG is well controlled at this time   Atrial fibrillation - chronic  - Rate control remains sub-optimal  - Cardiology following and addressing possible need to stop amio due to possible GI side effects - amio now stopped, Increase diltiazem and add low dose metoprolol. - takes Plavix which has been resumed - not on anti-coagulation due to history of multiple falls   Metabolic acidosis  -Due to acute renal failure and diarrhea - resolved as renal function improved   COPD on chronic oxygen with secondary pulmonary hypertension  -Currently no exacerbation   Obstructive sleep apnea    Code Status: FULL  Family Communication: spoke w/ pt and husband at bedside at length  Disposition Plan: transfer to tele bed (cardiac  unit)    Consultants:  cardiology  Procedures:  none  Antibiotics:  None  HPI/Subjective: Very nervous, wants something for anxiousness.  Objective: Filed Vitals:   07/12/13 1737 07/12/13 2120 07/13/13 0622 07/13/13 0823  BP: 121/79 134/55 117/59   Pulse: 83 80 118 122  Temp: 97.7 F (36.5 C) 97.5 F (36.4 C) 98.3 F (36.8 C)   TempSrc: Oral Oral Oral   Resp: 18 18 20 20   Height: 5' (1.524 m)     Weight: 87.4 kg (192 lb 10.9 oz)  86 kg (189 lb 9.5 oz)   SpO2: 94% 93% 96% 94%    Intake/Output Summary (Last 24 hours) at 07/13/13 0934 Last data filed at 07/13/13 0801  Gross per 24 hour  Intake    990 ml  Output   2054 ml  Net  -1064 ml   Filed Weights   07/12/13 0312 07/12/13 1737 07/13/13 0622  Weight: 85.1 kg (187 lb 9.8 oz) 87.4 kg (192 lb 10.9 oz) 86 kg (189 lb 9.5 oz)    Exam:  General: Alert, awake, oriented x3, in no acute distress.  HEENT: No bruits, no goiter.  Heart: Regular rate and rhythm, without murmurs, rubs, gallops.  Lungs: Good air movement, clear to auscultation. Abdomen: Soft, nontender, nondistended, positive bowel sounds.     Data Reviewed: Basic Metabolic Panel:  Recent Labs Lab 07/09/13 1542 07/09/13 2230 07/10/13 0435 07/11/13 0504 07/11/13 1550 07/12/13 0410 07/13/13 0606  NA 128*  --  131* 133*  --  137  138  K 4.6  --  4.2 3.0* 4.0 3.9 4.3  CL 90*  --  92* 93*  --  100 102  CO2 10*  --  10* 27  --  28 26  GLUCOSE 112*  --  154* 100*  --  125* 145*  BUN 116*  --  114* 95*  --  76* 50*  CREATININE 7.66* 7.24* 7.18* 4.22*  --  2.56* 1.53*  CALCIUM 7.6*  --  6.4* 6.0*  --  7.2* 7.8*  MG  --   --   --  1.2* 1.7 1.7  --   PHOS  --   --   --   --   --  4.2 3.1   Liver Function Tests:  Recent Labs Lab 07/09/13 1542 07/10/13 0435 07/12/13 0410 07/13/13 0606  AST 24 19  --   --   ALT 33 25  --   --   ALKPHOS 150* 120*  --   --   BILITOT 0.2* 0.2*  --   --   PROT 7.2 5.7*  --   --   ALBUMIN 3.1* 2.4* 2.4* 2.4*    No results found for this basename: LIPASE, AMYLASE,  in the last 168 hours No results found for this basename: AMMONIA,  in the last 168 hours CBC:  Recent Labs Lab 07/09/13 1542 07/09/13 2230 07/10/13 0435 07/11/13 0504 07/12/13 0410  WBC 9.2 10.1 7.1 4.8 5.2  NEUTROABS 7.3  --  5.7  --   --   HGB 11.6* 10.3* 9.1* 8.7* 8.9*  HCT 33.5* 30.3* 26.7* 24.7* 25.7*  MCV 84.8 87.1 85.9 83.4 86.0  PLT 313 295 267 226 213   Cardiac Enzymes:  Recent Labs Lab 07/09/13 2230  CKTOTAL 57  TROPONINI <0.30   BNP (last 3 results)  Recent Labs  06/07/13 1300 06/13/13 0406  PROBNP 3554.0* 1503.0*   CBG:  Recent Labs Lab 07/12/13 0800 07/12/13 1235 07/12/13 1652 07/12/13 2056 07/13/13 0643  GLUCAP 87 126* 119* 104* 137*    Recent Results (from the past 240 hour(s))  CULTURE, BLOOD (ROUTINE X 2)     Status: None   Collection Time    07/09/13  1:21 AM      Result Value Range Status   Specimen Description BLOOD RIGHT ARM   Final   Special Requests BOTTLES DRAWN AEROBIC AND ANAEROBIC 10CC EACH   Final   Culture  Setup Time     Final   Value: 07/10/2013 08:57     Performed at [facility]   Culture     Final   Value:        BLOOD CULTURE RECEIVED NO GROWTH TO DATE CULTURE WILL BE HELD FOR 5 DAYS BEFORE ISSUING A FINAL NEGATIVE REPORT     Performed at [facility]   Report Status PENDING   Incomplete  CLOSTRIDIUM DIFFICILE BY PCR     Status: None   Collection Time    07/09/13  4:16 PM      Result Value Range Status   C difficile by pcr NEGATIVE  NEGATIVE Final  URINE CULTURE     Status: None   Collection Time    07/09/13 10:40 PM      Result Value Range Status   Specimen Description URINE, CATHETERIZED   Final   Special Requests NONE   Final   Culture  Setup Time     Final   Value: 07/10/2013 09:06  Performed at [facility] Count     Final   Value: NO GROWTH     Performed at [facility]   Culture     Final   Value:  NO GROWTH     Performed at [facility]   Report Status 07/11/2013 FINAL   Final  MRSA PCR SCREENING     Status: None   Collection Time    07/09/13 10:40 PM      Result Value Range Status   MRSA by PCR NEGATIVE  NEGATIVE Final   Comment:            The GeneXpert MRSA Assay (FDA     approved for NASAL specimens     only), is one component of a     comprehensive MRSA colonization     surveillance program. It is not     intended to diagnose MRSA     infection nor to guide or     monitor treatment for     MRSA infections.  CULTURE, BLOOD (ROUTINE X 2)     Status: None   Collection Time    07/10/13  1:14 AM      Result Value Range Status   Specimen Description BLOOD LEFT HAND   Final   Special Requests BOTTLES DRAWN AEROBIC AND ANAEROBIC 10CC EACH   Final   Culture  Setup Time     Final   Value: 07/10/2013 08:57     Performed at [facility]   Culture     Final   Value:        BLOOD CULTURE RECEIVED NO GROWTH TO DATE CULTURE WILL BE HELD FOR 5 DAYS BEFORE ISSUING A FINAL NEGATIVE REPORT     Performed at [facility]   Report Status PENDING   Incomplete     Studies: Dg Chest Port 1 View  07/11/2013   CLINICAL DATA:  Hypoxia.  EXAM: PORTABLE CHEST - 1 VIEW  COMPARISON:  07/09/2013.  FINDINGS: Right lower lobe infiltrate has cleared. Minimal basilar atelectasis present. Heart size is stable. Pulmonary vascularity is normal. Right shoulder replacement. Degenerative changes left shoulder.  IMPRESSION: Previously identified right lower lobe infiltrate has cleared. Mild basilar atelectasis.   Electronically Signed   By: Maisie Fus  Register   On: 07/11/2013 15:04    Scheduled Meds: . azelastine  2 spray Each Nare Daily  . budesonide-formoterol  2 puff Inhalation BID  . clopidogrel  75 mg Oral Daily  . diltiazem  240 mg Oral Daily  . feeding supplement (RESOURCE BREEZE)  1 Container Oral TID BM  . heparin  5,000 Units Subcutaneous Q8H  . insulin aspart  0-9 Units  Subcutaneous TID WC  . levothyroxine  25 mcg Oral Custom  . levothyroxine  50 mcg Oral Custom  . ramelteon  8 mg Oral QHS  . sodium chloride  3 mL Intravenous Q12H   Continuous Infusions: . sodium chloride 50 mL/hr (07/12/13 1624)     Radonna Ricker Rosine Beat  [facility] Pager [phone]. If 8PM-8AM, please contact night-coverage at [email or web address], password [facility] 07/13/2013, 9:34 AM  LOS: 4 days

## 2013-07-13 NOTE — Evaluation (Signed)
Occupational Therapy Evaluation Patient Details Name: [name] MRN: [id number] DOB: [birth year] Today's Date: 07/13/2013 Time: 4098-1191 OT Time Calculation (min): 23 min  OT Assessment / Plan / Recommendation History of present illness [name] is a 79 y.o. female who was recently admitted last month for CHF/pneumonia and discharged to nursing home has been having persistent nausea vomiting and diarrhea over the last 3 weeks. Last few days patient has been become very weak and was brought to the ER. Initially patient was found to be hypotensive and blood pressure improved with 1 L normal saline bolus. Patient's labs show elevated creatinine of around 7 with metabolic acidosis which is acute with creatinine of 1.8 last month. Patient also was found to have UTI in the nursing home and was placed on Macrobid few days ago. Patient has been having recurrent nausea vomiting and diarrhea with abdominal cramps over the last 3 weeks. Denies any fever chills chest pain shortness of breath. Patient has been admitted for further management.   Clinical Impression   Pt demos decline in function and safety with ADLs and ADL mobility and would benefit from acute OT services to address impairments to help restore PLOF to return home safely. Pt reluctant to participate in OT this morning    OT Assessment  Patient needs continued OT Services    Follow Up Recommendations  Home health OT;Supervision/Assistance - 24 hour    Barriers to Discharge   none  Equipment Recommendations  None recommended by OT    Recommendations for Other Services    Frequency  Min 2X/week    Precautions / Restrictions Precautions Precautions: Fall Restrictions Weight Bearing Restrictions: No   Pertinent Vitals/Pain No c/o pain    ADL  Grooming: Performed;Wash/dry hands;Wash/dry face;Minimal assistance Where Assessed - Grooming: Supported standing Upper Body Bathing: Simulated;Supervision/safety;Set  up Lower Body Bathing: Simulated;Moderate assistance Upper Body Dressing: Performed;Supervision/safety;Set up Lower Body Dressing: Performed;Moderate assistance Toilet Transfer: Performed;Minimal assistance Toilet Transfer Method: Sit to stand Toilet Transfer Equipment: Bedside commode Toileting - Clothing Manipulation and Hygiene: Performed;Moderate assistance Where Assessed - Toileting Clothing Manipulation and Hygiene: Standing Tub/Shower Transfer Method: Not assessed Equipment Used: Gait belt;Rolling walker;Other (comment) (BSC) Transfers/Ambulation Related to ADLs: steadying assist, increased time, cues for UE placement    OT Diagnosis: Generalized weakness  OT Problem List: Decreased strength;Decreased activity tolerance;Impaired balance (sitting and/or standing);Cardiopulmonary status limiting activity OT Treatment Interventions: Self-care/ADL training;Therapeutic exercise;Patient/family education;Neuromuscular education;Balance training;Therapeutic activities   OT Goals(Current goals can be found in the care plan section) Acute Rehab OT Goals Patient Stated Goal: go home OT Goal Formulation: With patient Time For Goal Achievement: 07/13/13 Potential to Achieve Goals: Good ADL Goals Pt Will Perform Grooming: with min guard assist;with supervision;standing Pt Will Perform Lower Body Bathing: with min assist;sit to/from stand Pt Will Perform Lower Body Dressing: with min assist;sit to/from stand Pt Will Transfer to Toilet: with min guard assist;ambulating;regular height toilet;bedside commode;grab bars Pt Will Perform Toileting - Clothing Manipulation and hygiene: with min assist;with min guard assist;sitting/lateral leans;sit to/from stand Additional ADL Goal #1: Pt will complete bed mobility with min A to sit EOB in prep for ADLs  Visit Information  Last OT Received On: 07/13/13 Assistance Needed: +1 History of Present Illness: [name] is a 79 y.o. female who  was recently admitted last month for CHF/pneumonia and discharged to nursing home has been having persistent nausea vomiting and diarrhea over the last 3 weeks. Last few days patient has been become very weak and was  brought to the ER. Initially patient was found to be hypotensive and blood pressure improved with 1 L normal saline bolus. Patient's labs show elevated creatinine of around 7 with metabolic acidosis which is acute with creatinine of 1.8 last month. Patient also was found to have UTI in the nursing home and was placed on Macrobid few days ago. Patient has been having recurrent nausea vomiting and diarrhea with abdominal cramps over the last 3 weeks. Denies any fever chills chest pain shortness of breath. Patient has been admitted for further management.       Prior Functioning     Home Living Family/patient expects to be discharged to:: Private residence Living Arrangements: Spouse/significant other;Other relatives Available Help at Discharge: Family;Available 24 hours/day Type of Home: House Home Layout: Multi-level Alternate Level Stairs-Number of Steps: chair lift to second floor bedroom. family stays on 3rd level. Home Equipment: Walker - 4 wheels;Walker - standard;Tub bench;Cane - single point;Wheelchair - Careers adviser (comment) Additional Comments: pt uses RW at home Prior Function Level of Independence: Independent with assistive device(s) Comments: pt reports ind. with ambulation with AD but requires husband to donn/doff socks due to R hip pain (sciatica).  Communication Communication: No difficulties Dominant Hand: Right         Vision/Perception Vision - History Baseline Vision: Wears glasses only for reading Patient Visual Report: No change from baseline Vision - Assessment Eye Alignment: Within Functional Limits   Cognition  Cognition Arousal/Alertness: Awake/alert Behavior During Therapy: WFL for tasks assessed/performed;Anxious Overall Cognitive Status:  Within Functional Limits for tasks assessed    Extremity/Trunk Assessment Upper Extremity Assessment Upper Extremity Assessment: Overall WFL for tasks assessed;Generalized weakness Lower Extremity Assessment Lower Extremity Assessment: Defer to PT evaluation Cervical / Trunk Assessment Cervical / Trunk Assessment: Normal     Mobility Bed Mobility Bed Mobility: Supine to Sit;Sitting - Scoot to [place] of Bed;Sit to Supine;Scooting to [facility] Supine to Sit: 4: Min assist;HOB elevated;With rails;3: Mod assist Sitting - Scoot to Edge of Bed: 4: Min assist;With rail Sit to Supine: 3: Mod assist Scooting to HOB: 3: Mod assist Details for Bed Mobility Assistance: assist with trunk to sit EOB, assist with LEs back onto bed and to scoot to [facility] Transfers Transfers: Sit to Stand;Stand to Sit Sit to Stand: From bed;4: Min assist;From chair/3-in-1 Stand to Sit: To bed;To chair/3-in-1;4: Min guard Details for Transfer Assistance: steadying assist, increased time, cues for UE placement     Exercise     Balance Balance Balance Assessed: Yes Dynamic Sitting Balance Dynamic Sitting - Balance Support: No upper extremity supported;During functional activity;Feet unsupported Dynamic Sitting - Level of Assistance: 5: Stand by assistance Static Standing Balance Static Standing - Balance Support: Left upper extremity supported;During functional activity Static Standing - Level of Assistance: 5: Stand by assistance Dynamic Standing Balance Dynamic Standing - Balance Support: Left upper extremity supported;During functional activity Dynamic Standing - Level of Assistance: 4: Min assist   End of Session OT - End of Session Equipment Utilized During Treatment: Gait belt;Rolling walker;Other (comment) (BSC) Activity Tolerance: Patient limited by fatigue Patient left: in bed;with call bell/phone within reach  GO     Galen Manila 07/13/2013, 2:32 PM

## 2013-07-14 LAB — GLUCOSE, CAPILLARY
Glucose-Capillary: 126 mg/dL — ABNORMAL HIGH (ref 70–99)
Glucose-Capillary: 144 mg/dL — ABNORMAL HIGH (ref 70–99)
Glucose-Capillary: 136 mg/dL — ABNORMAL HIGH (ref 70–99)

## 2013-07-14 MED ORDER — DILTIAZEM HCL ER COATED BEADS 360 MG PO CP24
360.0000 mg | ORAL_CAPSULE | Freq: Every day | ORAL | Status: DC
  Administered 2013-07-15: 11:00:00 360 mg via ORAL
  Filled 2013-07-14: qty 1

## 2013-07-14 MED ORDER — METOPROLOL TARTRATE 50 MG PO TABS
50.0000 mg | ORAL_TABLET | Freq: Two times a day (BID) | ORAL | Status: DC
  Administered 2013-07-14 – 2013-07-15 (×2): 50 mg via ORAL
  Filled 2013-07-14 (×3): qty 1

## 2013-07-14 NOTE — Progress Notes (Addendum)
[facility] PROGRESS NOTE Interim History: 79 year old female with the past medical history of grade 2 diastolic congestive heart failure and right-sided pneumonia last month who presented with 3 weeks of nausea and vomiting and was found to have hypotension and acute renal failure on arrival. Apparently patient was found to have a UTI at the nursing facility and started on Macrobid a few days prior to her admit  Assessment/Plan: Atrial fibrillation with RVR - Rate control remains sub-optimal, increase metoprolol consult EP. On diltiazem and metoprolol was increased on 12.2.2014. - has refused anticoagulation several times. - Not a candidate for TEE/cardioversion and/or AFIB ablation unless she takes anticoagulation. - Cardiology following stop amio due to possible GI side effects - takes Plavix which has been resumed - not on anti-coagulation due to history of multiple falls   Acute renal failure on CKD stage III  - Prerenal and ATN due to use of lasix, lisinopril and colchicine along with dehydration from nausea and vomiting. - Resolved with hydration. Cr at baseline. - Baseline cr. appears to be 1.4 - 1.7  - Resume ACE in 2 weeks.  Nausea vomiting and diarrhea  - CT of the abdomen without contrast is negative  - Campilobacter positive. Start cipro, diarrhea improved. - C. difficile negative  - improved coincident with d/c of amiodarone   Diastolic CHF - Grade 2  - well compensated at present   Protein-calorie malnutrition, severe  -intake improving - advance to regular diet and follow   HYPOTHYROIDISM  - Continue Synthroid - TSH/FT4 pending   DM  -Low-dose insulin sliding scale for now due to poor by mouth intake - CBG is well controlled at this time   Metabolic acidosis  -Due to acute renal failure and diarrhea - resolved as renal function improved   COPD on chronic oxygen with secondary pulmonary hypertension  -Currently no exacerbation   Obstructive sleep apnea     Code Status: FULL  Family Communication: spoke w/ pt and husband at bedside at length  Disposition Plan: transfer to tele bed (cardiac unit)    Consultants:  cardiology  Procedures:  none  Antibiotics:  None  HPI/Subjective: No complains, except for at times palpitations.  Objective: Filed Vitals:   07/13/13 2047 07/14/13 0543 07/14/13 0923 07/14/13 0946  BP: 122/64 146/69 135/75   Pulse: 78 84 111   Temp: 98.5 F (36.9 C) 98.2 F (36.8 C) 97.6 F (36.4 C)   TempSrc: Oral Oral Oral   Resp: 20 20 20    Height:      Weight:  85.548 kg (188 lb 9.6 oz)    SpO2: 92% 93% 91% 91%    Intake/Output Summary (Last 24 hours) at 07/14/13 0955 Last data filed at 07/14/13 4098  Gross per 24 hour  Intake   2919 ml  Output   1525 ml  Net   1394 ml   Filed Weights   07/12/13 1737 07/13/13 0622 07/14/13 0543  Weight: 87.4 kg (192 lb 10.9 oz) 86 kg (189 lb 9.5 oz) 85.548 kg (188 lb 9.6 oz)    Exam:  General: Alert, awake, oriented x3, in no acute distress.  HEENT: No bruits, no goiter.  Heart: Regular rate and rhythm, without murmurs, rubs, gallops.  Lungs: Good air movement, clear to auscultation. Abdomen: Soft, nontender, nondistended, positive bowel sounds.     Data Reviewed: Basic Metabolic Panel:  Recent Labs Lab 07/09/13 1542 07/09/13 2230 07/10/13 0435 07/11/13 0504 07/11/13 1550 07/12/13 0410 07/13/13 0606  NA 128*  --  131* 133*  --  137 138  K 4.6  --  4.2 3.0* 4.0 3.9 4.3  CL 90*  --  92* 93*  --  100 102  CO2 10*  --  10* 27  --  28 26  GLUCOSE 112*  --  154* 100*  --  125* 145*  BUN 116*  --  114* 95*  --  76* 50*  CREATININE 7.66* 7.24* 7.18* 4.22*  --  2.56* 1.53*  CALCIUM 7.6*  --  6.4* 6.0*  --  7.2* 7.8*  MG  --   --   --  1.2* 1.7 1.7  --   PHOS  --   --   --   --   --  4.2 3.1   Liver Function Tests:  Recent Labs Lab 07/09/13 1542 07/10/13 0435 07/12/13 0410 07/13/13 0606  AST 24 19  --   --   ALT 33 25  --   --    ALKPHOS 150* 120*  --   --   BILITOT 0.2* 0.2*  --   --   PROT 7.2 5.7*  --   --   ALBUMIN 3.1* 2.4* 2.4* 2.4*   No results found for this basename: LIPASE, AMYLASE,  in the last 168 hours No results found for this basename: AMMONIA,  in the last 168 hours CBC:  Recent Labs Lab 07/09/13 1542 07/09/13 2230 07/10/13 0435 07/11/13 0504 07/12/13 0410  WBC 9.2 10.1 7.1 4.8 5.2  NEUTROABS 7.3  --  5.7  --   --   HGB 11.6* 10.3* 9.1* 8.7* 8.9*  HCT 33.5* 30.3* 26.7* 24.7* 25.7*  MCV 84.8 87.1 85.9 83.4 86.0  PLT 313 295 267 226 213   Cardiac Enzymes:  Recent Labs Lab 07/09/13 2230  CKTOTAL 57  TROPONINI <0.30   BNP (last 3 results)  Recent Labs  06/07/13 1300 06/13/13 0406  PROBNP 3554.0* 1503.0*   CBG:  Recent Labs Lab 07/13/13 0643 07/13/13 1046 07/13/13 1614 07/13/13 2238 07/14/13 0534  GLUCAP 137* 129* 130* 119* 126*    Recent Results (from the past 240 hour(s))  CULTURE, BLOOD (ROUTINE X 2)     Status: None   Collection Time    07/09/13  1:21 AM      Result Value Range Status   Specimen Description BLOOD RIGHT ARM   Final   Special Requests BOTTLES DRAWN AEROBIC AND ANAEROBIC 10CC EACH   Final   Culture  Setup Time     Final   Value: 07/10/2013 08:57     Performed at [facility]   Culture     Final   Value:        BLOOD CULTURE RECEIVED NO GROWTH TO DATE CULTURE WILL BE HELD FOR 5 DAYS BEFORE ISSUING A FINAL NEGATIVE REPORT     Performed at [facility]   Report Status PENDING   Incomplete  CLOSTRIDIUM DIFFICILE BY PCR     Status: None   Collection Time    07/09/13  4:16 PM      Result Value Range Status   C difficile by pcr NEGATIVE  NEGATIVE Final  URINE CULTURE     Status: None   Collection Time    07/09/13 10:40 PM      Result Value Range Status   Specimen Description URINE, CATHETERIZED   Final   Special Requests NONE   Final   Culture  Setup Time     Final   Value:  07/10/2013 09:06     Performed at [facility] Count     Final   Value: NO GROWTH     Performed at [facility]   Culture     Final   Value: NO GROWTH     Performed at [facility]   Report Status 07/11/2013 FINAL   Final  MRSA PCR SCREENING     Status: None   Collection Time    07/09/13 10:40 PM      Result Value Range Status   MRSA by PCR NEGATIVE  NEGATIVE Final   Comment:            The GeneXpert MRSA Assay (FDA     approved for NASAL specimens     only), is one component of a     comprehensive MRSA colonization     surveillance program. It is not     intended to diagnose MRSA     infection nor to guide or     monitor treatment for     MRSA infections.  CULTURE, BLOOD (ROUTINE X 2)     Status: None   Collection Time    07/10/13  1:14 AM      Result Value Range Status   Specimen Description BLOOD LEFT HAND   Final   Special Requests BOTTLES DRAWN AEROBIC AND ANAEROBIC 10CC EACH   Final   Culture  Setup Time     Final   Value: 07/10/2013 08:57     Performed at [facility]   Culture     Final   Value:        BLOOD CULTURE RECEIVED NO GROWTH TO DATE CULTURE WILL BE HELD FOR 5 DAYS BEFORE ISSUING A FINAL NEGATIVE REPORT     Performed at [facility]   Report Status PENDING   Incomplete     Studies: No results found.  Scheduled Meds: . azelastine  2 spray Each Nare Daily  . budesonide-formoterol  2 puff Inhalation BID  . ciprofloxacin  500 mg Oral BID  . clopidogrel  75 mg Oral Daily  . diltiazem  300 mg Oral Daily  . feeding supplement (RESOURCE BREEZE)  1 Container Oral TID BM  . heparin  5,000 Units Subcutaneous Q8H  . insulin aspart  0-9 Units Subcutaneous TID WC  . levothyroxine  25 mcg Oral Custom  . levothyroxine  50 mcg Oral Custom  . metoprolol tartrate  25 mg Oral BID  . ramelteon  8 mg Oral QHS  . sodium chloride  3 mL Intravenous Q12H   Continuous Infusions: . sodium chloride 50 mL/hr at 07/14/13 0542     Marinda Elk  [facility] Pager [phone]. If 8PM-8AM, please contact night-coverage at [email or web address], password [facility] 07/14/2013, 9:55 AM  LOS: 5 days

## 2013-07-14 NOTE — Progress Notes (Signed)
SUBJECTIVE:  Able to lie flat.  OBJECTIVE:   Vitals:   Filed Vitals:   07/14/13 0543 07/14/13 0923 07/14/13 0946 07/14/13 1300  BP: 146/69 135/75  124/70  Pulse: 84 111  90  Temp: 98.2 F (36.8 C) 97.6 F (36.4 C)  97.6 F (36.4 C)  TempSrc: Oral Oral  Oral  Resp: 20 20  20   Height:      Weight: 188 lb 9.6 oz (85.548 kg)     SpO2: 93% 91% 91% 92%   I&O's:    Intake/Output Summary (Last 24 hours) at 07/14/13 1649 Last data filed at 07/14/13 1604  Gross per 24 hour  Intake 3121.5 ml  Output   1425 ml  Net 1696.5 ml   TELEMETRY: Reviewed telemetry pt in atrial fibrillation wt 90-13 0bpm:     PHYSICAL EXAM General: Well developed, well nourished, in no acute distress Head: Eyes PERRLA, No xanthomas.   Normal cephalic and atramatic  Lungs:   Mild wheezing  bilaterally  Heart:   Irregularly irregular S1 S2 Pulses are 2+ & equal. Abdomen: Bowel sounds are positive, abdomen soft and non-tender without masses  Extremities:  Tr edema.  DP +1 Neuro: Alert and oriented X 3. Psych:  Flat affect, responds appropriately   LABS: Basic Metabolic Panel:  Recent Labs  04/54/09 0410 07/13/13 0606  NA 137 138  K 3.9 4.3  CL 100 102  CO2 28 26  GLUCOSE 125* 145*  BUN 76* 50*  CREATININE 2.56* 1.53*  CALCIUM 7.2* 7.8*  MG 1.7  --   PHOS 4.2 3.1   Liver Function Tests:  Recent Labs  07/12/13 0410 07/13/13 0606  ALBUMIN 2.4* 2.4*   No results found for this basename: LIPASE, AMYLASE,  in the last 72 hours CBC:  Recent Labs  07/12/13 0410  WBC 5.2  HGB 8.9*  HCT 25.7*  MCV 86.0  PLT 213   Cardiac Enzymes: No results found for this basename: CKTOTAL, CKMB, CKMBINDEX, TROPONINI,  in the last 72 hours BNP: No components found with this basename: POCBNP,  D-Dimer: No results found for this basename: DDIMER,  in the last 72 hours Hemoglobin A1C: No results found for this basename: HGBA1C,  in the last 72 hours Fasting Lipid Panel: No results found for  this basename: CHOL, HDL, LDLCALC, TRIG, CHOLHDL, LDLDIRECT,  in the last 72 hours Thyroid Function Tests:  Recent Labs  07/12/13 0410  TSH 2.997   Anemia Panel: No results found for this basename: VITAMINB12, FOLATE, FERRITIN, TIBC, IRON, RETICCTPCT,  in the last 72 hours Coag Panel:   Lab Results  Component Value Date   INR 1.19 06/10/2013   INR 1.26 06/09/2013   INR 1.39 06/08/2013    RADIOLOGY: Ct Abdomen Pelvis Wo Contrast  07/09/2013   CLINICAL DATA:  Abdominal pain. Nausea. Diarrhea. Acute renal failure.  EXAM: CT ABDOMEN AND PELVIS WITHOUT CONTRAST  TECHNIQUE: Multidetector CT imaging of the abdomen and pelvis was performed following the standard protocol without intravenous contrast.  COMPARISON:  05/20/2007  FINDINGS: Surgical clips noted from prior cholecystectomy. Noncontrast images of the liver, spleen, pancreas, and adrenal glands are normal in appearance. No evidence of renal calculi or hydronephrosis.  No soft tissue masses or lymphadenopathy identified within the abdomen or pelvis. Prior hysterectomy noted. Adnexal regions are unremarkable. Foley catheter seen within the bladder, and a pessary noted in the vagina. Sigmoid diverticulosis is noted, however there is no evidence of diverticulitis. No evidence of inflammatory process or abnormal  fluid collections.  IMPRESSION: No acute findings.  No evidence of hydronephrosis.  Diverticulosis. No radiographic evidence of diverticulitis.   Electronically Signed   By: Myles Rosenthal M.D.   On: 07/09/2013 22:42   Dg Chest 2 View  07/09/2013   CLINICAL DATA:  Abdominal pain with nausea and weakness, recent history of pneumonia  EXAM: CHEST  2 VIEW  COMPARISON:  CT scan of the chest of June 07, 2013.  FINDINGS: The lungs are well-expanded. The posterior right lower lobe infiltrate demonstrated on the previous CT scan is not evident on this chest x-ray. The cardiac silhouette is top-normal in size. The pulmonary vascularity is not  engorged. There is no pleural effusion. There is no pneumothorax. There is mild loss of height of multiple mid and lower thoracic vertebral bodies without evidence of high-grade which compression. The observed portions of the upper abdomen reveal no definite abnormal gas collection.  IMPRESSION: 1. There is no evidence of residual pneumonia in the right lower lobe. 2. There is borderline enlargement of cardiac silhouette without overt evidence of CHF.   Electronically Signed   By: David  [place]   On: 07/09/2013 16:49   Dg Chest Port 1 View  07/11/2013   CLINICAL DATA:  Hypoxia.  EXAM: PORTABLE CHEST - 1 VIEW  COMPARISON:  07/09/2013.  FINDINGS: Right lower lobe infiltrate has cleared. Minimal basilar atelectasis present. Heart size is stable. Pulmonary vascularity is normal. Right shoulder replacement. Degenerative changes left shoulder.  IMPRESSION: Previously identified right lower lobe infiltrate has cleared. Mild basilar atelectasis.   Electronically Signed   By: Maisie Fus  Register   On: 07/11/2013 15:04   Assessment/Plan:  1.Atrial Fibrillation with rapid ventricular response rate. Currently on  Cardizem 300mg /day for rate control but not on anti-coagulation due to history of multiple falls in the past.  - Per primary team's request, stopped amiodarone to see if her nausea gets better. Increased Cardizem as tolerated by blood pressure for rate control. May consider adding Metoprolol as tolerated by blood pressure to keep heart rate less than 110 bpm. No Digoxin at this time due to renal failure.  She was on sotalol in the past but this was stopped due to renal insufficiency.  She was switched to amiodarone.   - Not a candidate for TEE/cardioversion and/or AFIB ablation unless she takes anticoagulation. - check free TSH/T4 levels since she is on Synthroid.  2.  Nausea and vomiting secondary to amiodarone - better, but she still does not feel well. 3.  Acute on chronic renal insuff secondary to #2.  Improving dramatically.  When she stays in AFib for too long, she has had diastolic heart failure in the past.  Consult EP.  She has seen Dr. Johney Frame in the past.         Corky Crafts., MD  07/14/2013  4:49 PM

## 2013-07-14 NOTE — Progress Notes (Signed)
Patient evaluated for community based chronic disease management services with [facility] Care Management Program as a benefit of patient's [employer]. Spoke with patient and family at bedside to explain [facility] Care Management services.  Patient and family feel that they can currently manage her care, but will reach out to [name] if they or Genevieve Norlander feels that additional support may be indicated.  Spoke the [place], [place], requested that [facility] be referred in the event that the patient/family needs additional assistance.  Contact information and [facility] literature at bedside with the family.  Made Inpatient Case Manager aware that [facility] Care Management following. Of note, [facility] Care Management services does not replace or interfere with any services that are arranged by inpatient case management or social work.  For additional questions or referrals please contact Anibal Henderson BSN RN [facility] Liaison at [phone].

## 2013-07-14 NOTE — Care Management Note (Addendum)
  Page 2 of 2   07/14/2013     2:40:46 PM   CARE MANAGEMENT NOTE 07/14/2013  Patient:  QUETZAL, MEANY   Account Number:  [id number]  Date Initiated:  07/10/2013  Documentation initiated by:  Donn Pierini  Subjective/Objective Assessment:   Pt admitted admitted with dehydration, hypothermia, ARF     Action/Plan:   PTA pt lived at home with spouse- NCM to follow for d/c needs   Anticipated DC Date:  07/13/2013   Anticipated DC Plan:  HOME W HOME HEALTH SERVICES      DC Planning Services  CM consult      [facility] Choice  HOME HEALTH   Choice offered to / List presented to:  C-1 Patient        HH arranged  HH-1 RN  HH-10 DISEASE MANAGEMENT  HH-2 PT  HH-3 OT      [facility] agency  [facility]   Status of service:  Completed, signed off Medicare Important Message given?   (If response is "NO", the following Medicare IM given date fields will be blank) Date Medicare IM given:   Date Additional Medicare IM given:    Discharge Disposition:  HOME/SELF CARE  Per UR Regulation:  Reviewed for med. necessity/level of care/duration of stay  If discussed at Long Length of Stay Meetings, dates discussed:    Comments:  07/14/13 1030 Emmerson Taddei, RN, BSN, [employer] [phone] Spoke with pt at bedside regarding discharge planning for [facility]. Offered pt list of home health agencies to choose from.  Pt chose [facility] to render services. Ayesha Rumpf, RN of [facility] notified.  No DME needs identified at this time.

## 2013-07-14 NOTE — Progress Notes (Signed)
OT Cancellation Note  Patient Details Name: [name] MRN: [id number] DOB: 1934/08/03   Cancelled Treatment:    Reason Eval/Treat Not Completed: Fatigue/lethargy limiting ability to participate;Pain limiting ability to participate                                           Pt. C/o right shoulder pain and states "i just got it to steady down, i would really rather not do anything"  Will check back as able.  Robet Leu , COTA/L 07/14/2013, 3:33 PM

## 2013-07-14 NOTE — Consult Note (Addendum)
ELECTROPHYSIOLOGY CONSULT NOTE    Patient ID: [name] MRN: [id number], DOB/AGE: 18-Aug-1933 79 y.o.  Admit date: 07/09/2013 Date of Consult: 07-14-2013  Primary Physician: Gweneth Dimitri, MD Primary Cardiologist: Everette Rank, MD  Reason for Consultation: atrial fibrillation  HPI:  [name] is a 79 year old female with a past medical history significant for coronary disease (s/p stent to LAD 2008, last cath 2012 with nonsignificant disease), sleep apnea (on CPAP), hypertension, COPD, diabetes, hypothyroidism, and atrial fibrillation.  Her atrial fibrillation was initially diagnosed about 10 years ago.  She was maintained on Sotalol and Cardizem for rhythm control with good results until October 2014.  She was admitted at that time with pneumonia and was found to have recurrent atrial fibrillation with renal insuficiency.  Her Sotalol was discontinued and she was placed on Amiodarone 400mg  twice daily at that time.  She was seen back in follow up on 11-17 by Dr Eldridge Dace and complained of nausea.  Her Amiodarone was decreased to 200mg  daily (an EKG was not done on that date).  Since then the patient feels as though she has had episodes of atrial fibrillation.  An event monitor was ordered to evaluate symptoms.  However, she was admitted on 11-27 with nausea, vomiting, diarrhea, and acute renal failure (creat 7.4).  Her amiodarone was discontinued at that time.  She was also recently found to have an UTI and has been treated with Macrobid.  She states that since discontinuing her Amiodarone, her nausea, vomiting, and diarrhea have improved (nausea resolved within 24 hours of stopping Amio).  C diff was negative. She is unaware that she is currently in atrial fibrillation.   Last echo in 05-2013 demonstrated an EF of 65-70%, grade 2 diastolic dysfunction, mild to moderate MR, moderate TR, PA pressure of 42, LA 46.    Lab work this admission is notable for a creat initially of 7.4  (down to 1.53 with hydration), normal TSH, normal AST and ALT, alk phos 120.   ROS is negative except as outlined above  EP has been asked to evaluate for treatment options.   Past Medical History  Diagnosis Date  . CAD (coronary artery disease)   . CHF (congestive heart failure)   . Paroxysmal atrial fibrillation   . Bradycardia   . Hypertension   . Renal artery stenosis   . COPD (chronic obstructive pulmonary disease)   . Obesity hypoventilation syndrome   . Pruritus   . Tracheobronchitis   . Acute rhinosinusitis   . Anemia   . Hypoxemia   . Peripheral edema   . Obesity, endogenous   . Diabetes mellitus   . Acid reflux disease   . Osteoarthritis, generalized   . Hypothyroidism   . Lung nodule     lingula  . OSA (obstructive sleep apnea)     NPSG 04/03/00--AHI 28.hr  . DVT (deep venous thrombosis) 2003    left leg  . Vaginal atrophy   . Hx of colonic polyps   . Acute diastolic heart failure   . Secondary pulmonary hypertension 06/09/2013     Surgical History:  Past Surgical History  Procedure Laterality Date  . Lumbar spine surgery    . Cholecystectomy    . Hemorroidectomy    . Repair wound fistula    . Right shoulder    . Elbow surgery    . Dilation and curettage of uterus    . Breast surgery  1998    lumpectomy  . Oophorectomy  1959    BSO-? ovarian cancer     Prescriptions prior to admission  Medication Sig Dispense Refill  . albuterol (VENTOLIN HFA) 108 (90 BASE) MCG/ACT inhaler Inhale 2 puffs into the lungs every 4 (four) hours as needed for wheezing or shortness of breath.  1 Inhaler  0  . amiodarone (PACERONE) 200 MG tablet Take 1 tablet (200 mg total) by mouth daily.  30 tablet  6  . amLODipine (NORVASC) 5 MG tablet Take 5 mg by mouth daily.      Marland Kitchen azelastine (ASTELIN) 137 MCG/SPRAY nasal spray Place 2 sprays into the nose daily. Use in each nostril as directed  90 mL  3  . budesonide-formoterol (SYMBICORT) 160-4.5 MCG/ACT inhaler Inhale 2 puffs  into the lungs 2 (two) times daily.  3 Inhaler  3  . clopidogrel (PLAVIX) 75 MG tablet Take 75 mg by mouth daily.       . colchicine 0.6 MG tablet Take 0.6 mg by mouth 2 (two) times daily.       . cyclobenzaprine (FLEXERIL) 10 MG tablet Take 10 mg by mouth daily.       Marland Kitchen diltiazem (CARDIZEM) 120 MG tablet Take 240 mg by mouth daily.       . furosemide (LASIX) 40 MG tablet Take 1 tablet (40 mg total) by mouth 2 (two) times daily.  30 tablet  0  . glimepiride (AMARYL) 4 MG tablet Take 4 mg by mouth daily with breakfast. Once a day      . ibandronate (BONIVA) 150 MG tablet Take 150 mg by mouth every 30 (thirty) days. Take in the morning with a full glass of water, on an empty stomach, and do not take anything else by mouth or lie down for the next 30 min. (on or about the 25th of each month)      . insulin glargine (LANTUS) 100 UNIT/ML injection Inject 30 Units into the skin at bedtime as needed (for CBG over 200).       . isosorbide mononitrate (IMDUR) 30 MG 24 hr tablet Take 30 mg by mouth daily.       Marland Kitchen levothyroxine (SYNTHROID, LEVOTHROID) 25 MCG tablet Take 25 mcg by mouth See admin instructions. Take 1 tablet (25 mcg) on Monday, Wednesday, Friday (take 50 mcg on Tues, Thurs, Sat, Sun)      . levothyroxine (SYNTHROID, LEVOTHROID) 50 MCG tablet Take 50 mcg by mouth See admin instructions. Take 1 tablet (50 mcg) on Tuesday, Thursday, Saturday, Sunday (take 25 mcg on Monday, Wednesday and Friday)      . lisinopril (PRINIVIL,ZESTRIL) 10 MG tablet Take 10 mg by mouth daily.       Marland Kitchen loperamide (IMODIUM A-D) 2 MG tablet Take 2-4 mg by mouth 3 (three) times daily as needed for diarrhea or loose stools.      Marland Kitchen LORazepam (ATIVAN) 0.5 MG tablet Take 0.25 mg by mouth 3 (three) times daily.      . nitrofurantoin, macrocrystal-monohydrate, (MACROBID) 100 MG capsule Take 100 mg by mouth 2 (two) times daily. 7 day course started 07/06/2013      . nitroGLYCERIN (NITROSTAT) 0.4 MG SL tablet Place 0.4 mg under the  tongue every 5 (five) minutes as needed for chest pain.       Marland Kitchen ondansetron (ZOFRAN) 4 MG tablet Take 4 mg by mouth every 8 (eight) hours as needed for nausea or vomiting.      Marland Kitchen oxybutynin (DITROPAN XL) 10 MG 24 hr tablet Take 10  mg by mouth daily.       . OxyCODONE (OXYCONTIN) 20 mg T12A 12 hr tablet Take 20 mg by mouth every 12 (twelve) hours.      Marland Kitchen oxyCODONE-acetaminophen (PERCOCET/ROXICET) 5-325 MG per tablet Take 1-2 tablets by mouth every 6 (six) hours as needed for severe pain.  30 tablet  0  . ramelteon (ROZEREM) 8 MG tablet Take 8 mg by mouth at bedtime.        Inpatient Medications:  . azelastine  2 spray Each Nare Daily  . budesonide-formoterol  2 puff Inhalation BID  . ciprofloxacin  500 mg Oral BID  . clopidogrel  75 mg Oral Daily  . diltiazem  300 mg Oral Daily  . feeding supplement (RESOURCE BREEZE)  1 Container Oral TID BM  . heparin  5,000 Units Subcutaneous Q8H  . insulin aspart  0-9 Units Subcutaneous TID WC  . levothyroxine  25 mcg Oral Custom  . levothyroxine  50 mcg Oral Custom  . metoprolol tartrate  50 mg Oral BID  . ramelteon  8 mg Oral QHS  . sodium chloride  3 mL Intravenous Q12H    Allergies:  Allergies  Allergen Reactions  . Cefuroxime Axetil Swelling and Rash    Throat swelling  . Cephalexin Swelling and Rash    Throat swelling  . Clarithromycin Swelling and Rash    Throat swelling  . Doxycycline Swelling and Rash     rash on legs and feet, throat swelling  . Erythromycin Swelling and Rash    Throat swelling  . Penicillins Swelling and Rash    Throat swelling  . Tetracycline Swelling and Rash    Throat swelling    History   Social History  . Marital Status: Married    Spouse Name: N/A    Number of Children: 2  . Years of Education: N/A   Occupational History  . Not on file.   Social History Main Topics  . Smoking status: Former Smoker    Quit date: 08/13/1978  . Smokeless tobacco: Never Used  . Alcohol Use: No  . Drug Use: No    . Sexual Activity: No   Other Topics Concern  . Not on file   Social History Narrative  . No narrative on file     Family History  Problem Relation Age of Onset  . Pneumonia Mother   . Heart disease Mother   . Allergies Mother   . Emphysema Mother   . Arthritis Mother   . Heart attack Father   . Stroke Sister   . Diabetes Sister   . Heart disease Sister   . Diabetes Brother   . Diabetes      grandfather  . Clotting disorder Brother   . Arthritis Sister   . Breast cancer      aunt  . Bone cancer      aunt    Physical Exam: Filed Vitals:   07/14/13 0923 07/14/13 0946 07/14/13 1300 07/14/13 2018  BP: 135/75  124/70   Pulse: 111  90 85  Temp: 97.6 F (36.4 C)  97.6 F (36.4 C)   TempSrc: Oral  Oral   Resp: 20  20 18   Height:      Weight:      SpO2: 91% 91% 92% 92%    GEN- The patient is elderly, and chronically ill appearingappearing, alert and oriented x 3 today.   Head- normocephalic, atraumatic Eyes-  Sclera clear, conjunctiva pink Ears- hearing intact Oropharynx-  clear Neck- supple, + JVD to the jaw Lungs- decreased BS at the bases, normal work of breathing Heart- irregular rate and rhythm,   GI- soft, NT, ND, + BS Extremities- no clubbing, cyanosis, trace edema MS- age appropriate atrophy Skin- no rash or lesion Psych- euthymic mood, full affect Neuro- strength and sensation are intact   Labs:   Lab Results  Component Value Date   WBC 5.2 07/12/2013   HGB 8.9* 07/12/2013   HCT 25.7* 07/12/2013   MCV 86.0 07/12/2013   PLT 213 07/12/2013    Recent Labs Lab 07/10/13 0435  07/13/13 0606  NA 131*  < > 138  K 4.2  < > 4.3  CL 92*  < > 102  CO2 10*  < > 26  BUN 114*  < > 50*  CREATININE 7.18*  < > 1.53*  CALCIUM 6.4*  < > 7.8*  PROT 5.7*  --   --   BILITOT 0.2*  --   --   ALKPHOS 120*  --   --   ALT 25  --   --   AST 19  --   --   GLUCOSE 154*  < > 145*  < > = values in this interval not displayed. Lab Results  Component Value Date    CKTOTAL 57 07/09/2013   CKMB 1.2 09/08/2007   TROPONINI <0.30 07/09/2013    Radiology/Studies: Ct Abdomen Pelvis Wo Contrast 07/09/2013   CLINICAL DATA:  Abdominal pain. Nausea. Diarrhea. Acute renal failure.  EXAM: CT ABDOMEN AND PELVIS WITHOUT CONTRAST  TECHNIQUE: Multidetector CT imaging of the abdomen and pelvis was performed following the standard protocol without intravenous contrast.  COMPARISON:  05/20/2007  FINDINGS: Surgical clips noted from prior cholecystectomy. Noncontrast images of the liver, spleen, pancreas, and adrenal glands are normal in appearance. No evidence of renal calculi or hydronephrosis.  No soft tissue masses or lymphadenopathy identified within the abdomen or pelvis. Prior hysterectomy noted. Adnexal regions are unremarkable. Foley catheter seen within the bladder, and a pessary noted in the vagina. Sigmoid diverticulosis is noted, however there is no evidence of diverticulitis. No evidence of inflammatory process or abnormal fluid collections.  IMPRESSION: No acute findings.  No evidence of hydronephrosis.  Diverticulosis. No radiographic evidence of diverticulitis.   Electronically Signed   By: Myles Rosenthal M.D.   On: 07/09/2013 22:42   Dg Chest 2 View 07/09/2013   CLINICAL DATA:  Abdominal pain with nausea and weakness, recent history of pneumonia  EXAM: CHEST  2 VIEW  COMPARISON:  CT scan of the chest of June 07, 2013.  FINDINGS: The lungs are well-expanded. The posterior right lower lobe infiltrate demonstrated on the previous CT scan is not evident on this chest x-ray. The cardiac silhouette is top-normal in size. The pulmonary vascularity is not engorged. There is no pleural effusion. There is no pneumothorax. There is mild loss of height of multiple mid and lower thoracic vertebral bodies without evidence of high-grade which compression. The observed portions of the upper abdomen reveal no definite abnormal gas collection.  IMPRESSION: 1. There is no evidence of  residual pneumonia in the right lower lobe. 2. There is borderline enlargement of cardiac silhouette without overt evidence of CHF.   Electronically Signed   By: David  [place]   On: 07/09/2013 16:49   Dg Chest Port 1 View 07/11/2013   CLINICAL DATA:  Hypoxia.  EXAM: PORTABLE CHEST - 1 VIEW  COMPARISON:  07/09/2013.  FINDINGS: Right lower lobe infiltrate  has cleared. Minimal basilar atelectasis present. Heart size is stable. Pulmonary vascularity is normal. Right shoulder replacement. Degenerative changes left shoulder.  IMPRESSION: Previously identified right lower lobe infiltrate has cleared. Mild basilar atelectasis.   Electronically Signed   By: Maisie Fus  Register   On: 07/11/2013 15:04   EKG: coarse afib, rate 107, QRS 111  TELEMETRY: atrial fibrillation, ventricular rates 90-110  Assessment and Plan:  1.  The patient has persistent atrial fibrillation.  She is presently unaware of her afib.  She has had difficulty with diastolic dysfunction with her afib in the past, likely due to RVR.  She presently declines anticoagulation and therefore is not a candidate for antiarrhythmic medicines.  She would not be a good candidate for ablation long term. For now, I would recommend rate control.  Increase diltiazem to 360mg  daily and titrate metoprolol as necessary. I agree with avoidance of digoxin. I think that her presentation and GI symptoms are unlikely due to amiodarone.  This is her only antiarrhythmic option.  If she decides to take an anticoagulant, then consider restarting amiodarone after she is appropriately anticoagulated. Given her renal failure, I would be reluctant to consider a novel anticoagulant.  She may be best served with coumadin long term.  2. Chronic diastolic dysfunction Caution with IV fluid (we don't want to overcorrect)  3. Acute renal failure Improving with IVF  4. GI symptoms of N/V and diarrhea Unlikely due to amiodarone.  We may be able to try this medicine again if  she decides to consider anticoagulation.  I will see as needed while here.  Please call with questions.

## 2013-07-14 NOTE — Progress Notes (Signed)
Physical Therapy Treatment Patient Details Name: DONATA REDDICK MRN: [id number] DOB: [birth year] Today's Date: 07/14/2013 Time: 0865-7846 PT Time Calculation (min): 24 min  PT Assessment / Plan / Recommendation  History of Present Illness TABITHA RIGGINS is a 79 y.o. female who was recently admitted last month for CHF/pneumonia and discharged to nursing home has been having persistent nausea vomiting and diarrhea over the last 3 weeks. Last few days patient has been become very weak and was brought to the ER. Initially patient was found to be hypotensive and blood pressure improved with 1 L normal saline bolus. Patient's labs show elevated creatinine of around 7 with metabolic acidosis which is acute with creatinine of 1.8 last month. Patient also was found to have UTI in the nursing home and was placed on Macrobid few days ago. Patient has been having recurrent nausea vomiting and diarrhea with abdominal cramps over the last 3 weeks. Denies any fever chills chest pain shortness of breath. Patient has been admitted for further management.   PT Comments   Pt continues to require frequent rest breaks throughout all mobility due to anxiety, fatigue.  Pt able to perform transfers and gait at supervision level, continues to require assist for bed mobility and standing from low surfaces.  Follow Up Recommendations  Home health PT     Does the patient have the potential to tolerate intense rehabilitation     Barriers to Discharge        Equipment Recommendations  None recommended by PT    Recommendations for Other Services    Frequency Min 3X/week   Progress towards PT Goals Progress towards PT goals: Progressing toward goals  Plan Current plan remains appropriate    Precautions / Restrictions Precautions Precautions: Fall Restrictions Weight Bearing Restrictions: No   Pertinent Vitals/Pain No c/o pain    Mobility  Bed Mobility Supine to Sit: 5: Supervision;HOB  elevated Sitting - Scoot to Edge of Bed: 5: Supervision Sit to Supine: 3: Mod assist Scooting to [facility]: 3: Mod assist Details for Bed Mobility Assistance: pt requires assist to lift B LEs into bed for sit to supine Transfers Transfers: Stand Pivot Transfers Sit to Stand: 4: Min assist;From bed;From chair/3-in-1;5: Supervision Stand to Sit: To bed;To chair/3-in-1;4: Min guard;5: Supervision Stand Pivot Transfers: 4: Min guard Details for Transfer Assistance: Pt requires min A to stand from low chair, supervision from bed and 3 in 1, cues for UE placement.  Pt able to perform SPT to/from [facility] without AD with cues for hand placement using rails of BSC Ambulation/Gait Ambulation/Gait Assistance: 5: Supervision Ambulation Distance (Feet): 25 Feet Assistive device: Rolling walker Ambulation/Gait Assistance Details: pt gait with RW 25', 10' with seated rest between.  pt requires cues for deep breathing due to anxiety, requires prolonged rest breaks due to fatigue    Exercises     PT Diagnosis:    PT Problem List:   PT Treatment Interventions:     PT Goals (current goals can now be found in the care plan section)    Visit Information  Last PT Received On: 07/14/13 Assistance Needed: +1 History of Present Illness: ONNIE ALATORRE is a 79 y.o. female who was recently admitted last month for CHF/pneumonia and discharged to nursing home has been having persistent nausea vomiting and diarrhea over the last 3 weeks. Last few days patient has been become very weak and was brought to the ER. Initially patient was found to be hypotensive and blood pressure improved with 1  L normal saline bolus. Patient's labs show elevated creatinine of around 7 with metabolic acidosis which is acute with creatinine of 1.8 last month. Patient also was found to have UTI in the nursing home and was placed on Macrobid few days ago. Patient has been having recurrent nausea vomiting and diarrhea with abdominal cramps over  the last 3 weeks. Denies any fever chills chest pain shortness of breath. Patient has been admitted for further management.    Subjective Data      Cognition  Cognition Arousal/Alertness: Awake/alert Behavior During Therapy: WFL for tasks assessed/performed;Anxious Overall Cognitive Status: Within Functional Limits for tasks assessed    Balance     End of Session PT - End of Session Equipment Utilized During Treatment: Gait belt;Oxygen Activity Tolerance: Patient limited by fatigue Patient left: in bed;with call bell/phone within reach;with nursing/sitter in room   GP     [facility] 07/14/2013, 9:50 AM

## 2013-07-15 ENCOUNTER — Encounter (HOSPITAL_COMMUNITY): Payer: Self-pay | Admitting: Emergency Medicine

## 2013-07-15 ENCOUNTER — Emergency Department (HOSPITAL_COMMUNITY): Payer: Medicare Other

## 2013-07-15 ENCOUNTER — Inpatient Hospital Stay (HOSPITAL_COMMUNITY)
Admission: EM | Admit: 2013-07-15 | Discharge: 2013-07-22 | DRG: 291 | Disposition: A | Discharge status: Skilled Nursing Facility | Payer: Medicare Other | Attending: Internal Medicine | Admitting: Internal Medicine

## 2013-07-15 DIAGNOSIS — R0609 Other forms of dyspnea: Secondary | ICD-10-CM

## 2013-07-15 DIAGNOSIS — Z6837 Body mass index (BMI) 37.0-37.9, adult: Secondary | ICD-10-CM

## 2013-07-15 DIAGNOSIS — Z9981 Dependence on supplemental oxygen: Secondary | ICD-10-CM

## 2013-07-15 DIAGNOSIS — Z9181 History of falling: Secondary | ICD-10-CM

## 2013-07-15 DIAGNOSIS — G4733 Obstructive sleep apnea (adult) (pediatric): Secondary | ICD-10-CM

## 2013-07-15 DIAGNOSIS — I498 Other specified cardiac arrhythmias: Secondary | ICD-10-CM

## 2013-07-15 DIAGNOSIS — J441 Chronic obstructive pulmonary disease with (acute) exacerbation: Secondary | ICD-10-CM

## 2013-07-15 DIAGNOSIS — E678 Other specified hyperalimentation: Secondary | ICD-10-CM

## 2013-07-15 DIAGNOSIS — E039 Hypothyroidism, unspecified: Secondary | ICD-10-CM

## 2013-07-15 DIAGNOSIS — K219 Gastro-esophageal reflux disease without esophagitis: Secondary | ICD-10-CM

## 2013-07-15 DIAGNOSIS — Z808 Family history of malignant neoplasm of other organs or systems: Secondary | ICD-10-CM

## 2013-07-15 DIAGNOSIS — E1165 Type 2 diabetes mellitus with hyperglycemia: Secondary | ICD-10-CM | POA: Diagnosis present

## 2013-07-15 DIAGNOSIS — Z889 Allergy status to unspecified drugs, medicaments and biological substances status: Secondary | ICD-10-CM

## 2013-07-15 DIAGNOSIS — E119 Type 2 diabetes mellitus without complications: Secondary | ICD-10-CM

## 2013-07-15 DIAGNOSIS — R0989 Other specified symptoms and signs involving the circulatory and respiratory systems: Secondary | ICD-10-CM

## 2013-07-15 DIAGNOSIS — Z794 Long term (current) use of insulin: Secondary | ICD-10-CM

## 2013-07-15 DIAGNOSIS — N183 Chronic kidney disease, stage 3 unspecified: Secondary | ICD-10-CM | POA: Diagnosis present

## 2013-07-15 DIAGNOSIS — R609 Edema, unspecified: Secondary | ICD-10-CM

## 2013-07-15 DIAGNOSIS — Z79899 Other long term (current) drug therapy: Secondary | ICD-10-CM

## 2013-07-15 DIAGNOSIS — I509 Heart failure, unspecified: Secondary | ICD-10-CM

## 2013-07-15 DIAGNOSIS — J9620 Acute and chronic respiratory failure, unspecified whether with hypoxia or hypercapnia: Secondary | ICD-10-CM | POA: Diagnosis present

## 2013-07-15 DIAGNOSIS — R0902 Hypoxemia: Secondary | ICD-10-CM

## 2013-07-15 DIAGNOSIS — I5033 Acute on chronic diastolic (congestive) heart failure: Principal | ICD-10-CM

## 2013-07-15 DIAGNOSIS — I4891 Unspecified atrial fibrillation: Secondary | ICD-10-CM

## 2013-07-15 DIAGNOSIS — I959 Hypotension, unspecified: Secondary | ICD-10-CM

## 2013-07-15 DIAGNOSIS — J018 Other acute sinusitis: Secondary | ICD-10-CM

## 2013-07-15 DIAGNOSIS — N19 Unspecified kidney failure: Secondary | ICD-10-CM

## 2013-07-15 DIAGNOSIS — E872 Acidosis, unspecified: Secondary | ICD-10-CM

## 2013-07-15 DIAGNOSIS — IMO0002 Reserved for concepts with insufficient information to code with codable children: Secondary | ICD-10-CM

## 2013-07-15 DIAGNOSIS — D649 Anemia, unspecified: Secondary | ICD-10-CM

## 2013-07-15 DIAGNOSIS — I10 Essential (primary) hypertension: Secondary | ICD-10-CM

## 2013-07-15 DIAGNOSIS — E662 Morbid (severe) obesity with alveolar hypoventilation: Secondary | ICD-10-CM | POA: Diagnosis present

## 2013-07-15 DIAGNOSIS — I129 Hypertensive chronic kidney disease with stage 1 through stage 4 chronic kidney disease, or unspecified chronic kidney disease: Secondary | ICD-10-CM | POA: Diagnosis present

## 2013-07-15 DIAGNOSIS — Z836 Family history of other diseases of the respiratory system: Secondary | ICD-10-CM

## 2013-07-15 DIAGNOSIS — M159 Polyosteoarthritis, unspecified: Secondary | ICD-10-CM

## 2013-07-15 DIAGNOSIS — Z803 Family history of malignant neoplasm of breast: Secondary | ICD-10-CM

## 2013-07-15 DIAGNOSIS — I4892 Unspecified atrial flutter: Secondary | ICD-10-CM | POA: Diagnosis present

## 2013-07-15 DIAGNOSIS — Z8249 Family history of ischemic heart disease and other diseases of the circulatory system: Secondary | ICD-10-CM

## 2013-07-15 DIAGNOSIS — J301 Allergic rhinitis due to pollen: Secondary | ICD-10-CM

## 2013-07-15 DIAGNOSIS — J189 Pneumonia, unspecified organism: Secondary | ICD-10-CM | POA: Diagnosis present

## 2013-07-15 DIAGNOSIS — Z880 Allergy status to penicillin: Secondary | ICD-10-CM

## 2013-07-15 DIAGNOSIS — J45901 Unspecified asthma with (acute) exacerbation: Secondary | ICD-10-CM

## 2013-07-15 DIAGNOSIS — E871 Hypo-osmolality and hyponatremia: Secondary | ICD-10-CM

## 2013-07-15 DIAGNOSIS — E349 Endocrine disorder, unspecified: Secondary | ICD-10-CM

## 2013-07-15 DIAGNOSIS — E875 Hyperkalemia: Secondary | ICD-10-CM

## 2013-07-15 DIAGNOSIS — J984 Other disorders of lung: Secondary | ICD-10-CM

## 2013-07-15 DIAGNOSIS — I2510 Atherosclerotic heart disease of native coronary artery without angina pectoris: Secondary | ICD-10-CM

## 2013-07-15 DIAGNOSIS — J4489 Other specified chronic obstructive pulmonary disease: Secondary | ICD-10-CM

## 2013-07-15 DIAGNOSIS — J40 Bronchitis, not specified as acute or chronic: Secondary | ICD-10-CM

## 2013-07-15 DIAGNOSIS — Z86718 Personal history of other venous thrombosis and embolism: Secondary | ICD-10-CM

## 2013-07-15 DIAGNOSIS — J449 Chronic obstructive pulmonary disease, unspecified: Secondary | ICD-10-CM

## 2013-07-15 DIAGNOSIS — I2789 Other specified pulmonary heart diseases: Secondary | ICD-10-CM | POA: Diagnosis present

## 2013-07-15 DIAGNOSIS — Z87891 Personal history of nicotine dependence: Secondary | ICD-10-CM

## 2013-07-15 DIAGNOSIS — J9621 Acute and chronic respiratory failure with hypoxia: Secondary | ICD-10-CM | POA: Diagnosis present

## 2013-07-15 DIAGNOSIS — E46 Unspecified protein-calorie malnutrition: Secondary | ICD-10-CM | POA: Diagnosis present

## 2013-07-15 DIAGNOSIS — E43 Unspecified severe protein-calorie malnutrition: Secondary | ICD-10-CM

## 2013-07-15 DIAGNOSIS — Z823 Family history of stroke: Secondary | ICD-10-CM

## 2013-07-15 DIAGNOSIS — R0602 Shortness of breath: Secondary | ICD-10-CM

## 2013-07-15 DIAGNOSIS — N179 Acute kidney failure, unspecified: Secondary | ICD-10-CM

## 2013-07-15 DIAGNOSIS — L299 Pruritus, unspecified: Secondary | ICD-10-CM

## 2013-07-15 DIAGNOSIS — Z833 Family history of diabetes mellitus: Secondary | ICD-10-CM

## 2013-07-15 DIAGNOSIS — I5031 Acute diastolic (congestive) heart failure: Secondary | ICD-10-CM

## 2013-07-15 DIAGNOSIS — R112 Nausea with vomiting, unspecified: Secondary | ICD-10-CM

## 2013-07-15 DIAGNOSIS — I701 Atherosclerosis of renal artery: Secondary | ICD-10-CM

## 2013-07-15 LAB — PRO B NATRIURETIC PEPTIDE: Pro B Natriuretic peptide (BNP): 31366 pg/mL — ABNORMAL HIGH (ref 0–450)

## 2013-07-15 LAB — GLUCOSE, CAPILLARY
Glucose-Capillary: 142 mg/dL — ABNORMAL HIGH (ref 70–99)
Glucose-Capillary: 149 mg/dL — ABNORMAL HIGH (ref 70–99)
Glucose-Capillary: 136 mg/dL — ABNORMAL HIGH (ref 70–99)

## 2013-07-15 LAB — POCT I-STAT 3, ART BLOOD GAS (G3+)
Acid-base deficit: 4 mmol/L — ABNORMAL HIGH (ref 0.0–2.0)
Bicarbonate: 19.7 mEq/L — ABNORMAL LOW (ref 20.0–24.0)
O2 Saturation: 97 %
Patient temperature: 98.6
pH, Arterial: 7.413 (ref 7.350–7.450)

## 2013-07-15 LAB — URINALYSIS, ROUTINE W REFLEX MICROSCOPIC
Hgb urine dipstick: NEGATIVE
Ketones, ur: NEGATIVE mg/dL
Nitrite: NEGATIVE
Urobilinogen, UA: 0.2 mg/dL (ref 0.0–1.0)
pH: 5 (ref 5.0–8.0)

## 2013-07-15 LAB — BASIC METABOLIC PANEL
BUN: 40 mg/dL — ABNORMAL HIGH (ref 6–23)
BUN: 43 mg/dL — ABNORMAL HIGH (ref 6–23)
Calcium: 8.2 mg/dL — ABNORMAL LOW (ref 8.4–10.5)
Chloride: 98 mEq/L (ref 96–112)
Creatinine, Ser: 1.5 mg/dL — ABNORMAL HIGH (ref 0.50–1.10)
GFR calc Af Amer: 38 mL/min — ABNORMAL LOW (ref >90)
GFR calc Af Amer: 31 mL/min — ABNORMAL LOW (ref >90)
GFR calc non Af Amer: 32 mL/min — ABNORMAL LOW (ref >90)
Glucose, Bld: 174 mg/dL — ABNORMAL HIGH (ref 70–99)
Potassium: 5.4 mEq/L — ABNORMAL HIGH (ref 3.5–5.1)
Potassium: 6.8 mEq/L (ref 3.5–5.1)

## 2013-07-15 LAB — CBC WITH DIFFERENTIAL/PLATELET
Basophils Absolute: 0 10*3/uL (ref 0.0–0.1)
Hemoglobin: 8.8 g/dL — ABNORMAL LOW (ref 12.0–15.0)
Lymphocytes Relative: 6 % — ABNORMAL LOW (ref 12–46)
MCV: 90.7 fL (ref 78.0–100.0)
Neutro Abs: 11 10*3/uL — ABNORMAL HIGH (ref 1.7–7.7)
Platelets: 268 10*3/uL (ref 150–400)
RBC: 3.02 MIL/uL — ABNORMAL LOW (ref 3.87–5.11)
RDW: 19.5 % — ABNORMAL HIGH (ref 11.5–15.5)
WBC: 12.5 10*3/uL — ABNORMAL HIGH (ref 4.0–10.5)

## 2013-07-15 LAB — POCT I-STAT TROPONIN I: Troponin i, poc: 0.07 ng/mL (ref 0.00–0.08)

## 2013-07-15 LAB — POCT I-STAT, CHEM 8
BUN: 43 mg/dL — ABNORMAL HIGH (ref 6–23)
Chloride: 102 mEq/L (ref 96–112)
Creatinine, Ser: 2.1 mg/dL — ABNORMAL HIGH (ref 0.50–1.10)
HCT: 25 % — ABNORMAL LOW (ref 36.0–46.0)
Potassium: 5.3 mEq/L — ABNORMAL HIGH (ref 3.5–5.1)
Sodium: 135 mEq/L (ref 135–145)
TCO2: 21 mmol/L (ref 0–100)

## 2013-07-15 LAB — URINE MICROSCOPIC-ADD ON

## 2013-07-15 LAB — CG4 I-STAT (LACTIC ACID): Lactic Acid, Venous: 3.43 mmol/L — ABNORMAL HIGH (ref 0.5–2.2)

## 2013-07-15 LAB — PROTIME-INR: Prothrombin Time: 18.1 seconds — ABNORMAL HIGH (ref 11.6–15.2)

## 2013-07-15 MED ORDER — VANCOMYCIN HCL 10 G IV SOLR: 20.0000 mg/kg | Freq: Once | INTRAVENOUS | Status: DC

## 2013-07-15 MED ORDER — SODIUM BICARBONATE 8.4 % IV SOLN
50.0000 meq | Freq: Once | INTRAVENOUS | Status: AC
  Administered 2013-07-15: 50 meq via INTRAVENOUS
  Filled 2013-07-15: qty 50

## 2013-07-15 MED ORDER — LEVALBUTEROL HCL 0.63 MG/3ML IN NEBU: 0.6300 mg | INHALATION_SOLUTION | Freq: Four times a day (QID) | RESPIRATORY_TRACT | Status: DC | PRN

## 2013-07-15 MED ORDER — ALBUTEROL SULFATE (5 MG/ML) 0.5% IN NEBU
2.5000 mg | INHALATION_SOLUTION | Freq: Once | RESPIRATORY_TRACT | Status: AC
  Administered 2013-07-15: 2.5 mg via RESPIRATORY_TRACT
  Filled 2013-07-15: qty 0.5

## 2013-07-15 MED ORDER — VANCOMYCIN HCL 10 G IV SOLR
1250.0000 mg | INTRAVENOUS | Status: DC
  Administered 2013-07-15 – 2013-07-19 (×5): 1250 mg via INTRAVENOUS
  Filled 2013-07-15 (×7): qty 1250

## 2013-07-15 MED ORDER — SODIUM CHLORIDE 0.9 % IV SOLN: Freq: Once | INTRAVENOUS | Status: DC

## 2013-07-15 MED ORDER — ISOSORBIDE MONONITRATE 15 MG HALF TABLET: 15.0000 mg | ORAL_TABLET | Freq: Every day | ORAL | Status: DC

## 2013-07-15 MED ORDER — LEVALBUTEROL HCL 1.25 MG/0.5ML IN NEBU
1.2500 mg | INHALATION_SOLUTION | Freq: Once | RESPIRATORY_TRACT | Status: AC
  Administered 2013-07-15: 02:00:00 1.25 mg via RESPIRATORY_TRACT
  Filled 2013-07-15: qty 0.5

## 2013-07-15 MED ORDER — BUDESONIDE 0.25 MG/2ML IN SUSP: 0.2500 mg | Freq: Two times a day (BID) | RESPIRATORY_TRACT | Status: DC

## 2013-07-15 MED ORDER — FUROSEMIDE 10 MG/ML IJ SOLN
40.0000 mg | Freq: Once | INTRAMUSCULAR | Status: AC
  Administered 2013-07-15: 40 mg via INTRAVENOUS
  Filled 2013-07-15: qty 4

## 2013-07-15 MED ORDER — LEVALBUTEROL HCL 0.63 MG/3ML IN NEBU
0.6300 mg | INHALATION_SOLUTION | Freq: Four times a day (QID) | RESPIRATORY_TRACT | Status: DC | PRN
  Administered 2013-07-15: 15:00:00 0.63 mg via RESPIRATORY_TRACT
  Filled 2013-07-15: qty 3

## 2013-07-15 MED ORDER — DEXTROSE 5 % IV SOLN
2.0000 g | Freq: Once | INTRAVENOUS | Status: AC
  Administered 2013-07-15: 2 g via INTRAVENOUS
  Filled 2013-07-15: qty 2

## 2013-07-15 MED ORDER — DILTIAZEM HCL ER COATED BEADS 360 MG PO CP24: 360.0000 mg | ORAL_CAPSULE | Freq: Every day | ORAL | Status: DC

## 2013-07-15 MED ORDER — IPRATROPIUM BROMIDE 0.02 % IN SOLN
0.5000 mg | Freq: Once | RESPIRATORY_TRACT | Status: AC
  Administered 2013-07-15: 0.5 mg via RESPIRATORY_TRACT
  Filled 2013-07-15: qty 2.5

## 2013-07-15 MED ORDER — METHYLPREDNISOLONE SODIUM SUCC 125 MG IJ SOLR
60.0000 mg | Freq: Two times a day (BID) | INTRAMUSCULAR | Status: DC
  Administered 2013-07-16: 60 mg via INTRAVENOUS
  Filled 2013-07-15: qty 0.96
  Filled 2013-07-15: qty 2
  Filled 2013-07-15: qty 0.96

## 2013-07-15 MED ORDER — METOPROLOL TARTRATE 50 MG PO TABS: 50.0000 mg | ORAL_TABLET | Freq: Two times a day (BID) | ORAL | Status: DC

## 2013-07-15 MED ORDER — CIPROFLOXACIN HCL 500 MG PO TABS: 500.0000 mg | ORAL_TABLET | Freq: Two times a day (BID) | ORAL | Status: DC

## 2013-07-15 MED ORDER — DEXTROSE 5 % IV SOLN
1.0000 g | Freq: Three times a day (TID) | INTRAVENOUS | Status: DC
  Administered 2013-07-16 – 2013-07-22 (×18): 1 g via INTRAVENOUS
  Filled 2013-07-15 (×23): qty 1

## 2013-07-15 MED ORDER — BUDESONIDE 0.25 MG/2ML IN SUSP
0.2500 mg | Freq: Two times a day (BID) | RESPIRATORY_TRACT | Status: DC
  Filled 2013-07-15 (×3): qty 2

## 2013-07-15 MED ORDER — FUROSEMIDE 40 MG PO TABS: 20.0000 mg | ORAL_TABLET | Freq: Every day | ORAL | Status: DC

## 2013-07-15 MED ORDER — LISINOPRIL 10 MG PO TABS: 10.0000 mg | ORAL_TABLET | Freq: Every day | ORAL | Status: DC

## 2013-07-15 NOTE — H&P (Signed)
[facility] History and Physical  Patient: [name]  MVH:[id number]  DOB: 10/22/1933  DOS: the patient was seen and examined on 07/15/2013 PCP: MCNEILL,WENDY, MD  Chief Complaint: Shortness of breath and cough  HPI: [name] is a 79 y.o. female with Past medical history of CAD CHF atrial fibrillation hypertension COPD sleep apnea on CPAP diabetes. The patient is coming from home. The patient was discharged from the hospital today after her admission for Nausea and vomiting Which was thought to be secondary to amiodarone and it was stopped.She also had acute kidney injury for which her Lasix lisinopril and colchicine were ordered and she was given IV hydration. After the discharge the patient went at home and she couldn't come out of the car due to severe weakness and shortness of breath at lied down on the ground.Since the family couldn't help the patient, EMS was called. At the time of my evaluation the patient denied any complaint of chest pain, nausea, vomiting, abdominal pain, burning urination, leg swelling or calf tenderness.  Review of Systems: as mentioned in the history of present illness.  A Comprehensive review of the other systems is negative.  Past Medical History  Diagnosis Date  . CAD (coronary artery disease)   . CHF (congestive heart failure)   . Paroxysmal atrial fibrillation   . Bradycardia   . Hypertension   . Renal artery stenosis   . COPD (chronic obstructive pulmonary disease)   . Obesity hypoventilation syndrome   . Pruritus   . Tracheobronchitis   . Acute rhinosinusitis   . Anemia   . Hypoxemia   . Peripheral edema   . Obesity, endogenous   . Diabetes mellitus   . Acid reflux disease   . Osteoarthritis, generalized   . Hypothyroidism   . Lung nodule     lingula  . OSA (obstructive sleep apnea)     NPSG 04/03/00--AHI 28.hr  . DVT (deep venous thrombosis) 2003    left leg  . Vaginal atrophy   . Hx of colonic polyps    . Acute diastolic heart failure   . Secondary pulmonary hypertension 06/09/2013   Past Surgical History  Procedure Laterality Date  . Lumbar spine surgery    . Cholecystectomy    . Hemorroidectomy    . Repair wound fistula    . Right shoulder    . Elbow surgery    . Dilation and curettage of uterus    . Breast surgery  1998    lumpectomy  . Oophorectomy  1959    BSO-? ovarian cancer   Social History:  reports that she quit smoking about 34 years ago. She has never used smokeless tobacco. She reports that she does not drink alcohol or use illicit drugs. Independent for most of her  ADL.  Allergies  Allergen Reactions  . Cefuroxime Axetil Shortness Of Breath, Swelling and Rash    Throat swelling  . Cephalexin Shortness Of Breath, Swelling and Rash    Throat swelling  . Clarithromycin Shortness Of Breath, Swelling and Rash    Throat swelling  . Doxycycline Shortness Of Breath, Swelling and Rash     rash on legs and feet, throat swelling  . Erythromycin Shortness Of Breath, Swelling and Rash    Throat swelling  . Penicillins Shortness Of Breath, Swelling and Rash    Throat swelling  . Tetracycline Shortness Of Breath, Swelling and Rash    Throat swelling    Family History  Problem Relation Age  of Onset  . Pneumonia Mother   . Heart disease Mother   . Allergies Mother   . Emphysema Mother   . Arthritis Mother   . Heart attack Father   . Stroke Sister   . Diabetes Sister   . Heart disease Sister   . Diabetes Brother   . Diabetes      grandfather  . Clotting disorder Brother   . Arthritis Sister   . Breast cancer      aunt  . Bone cancer      aunt    Prior to Admission medications   Medication Sig Start Date End Date Taking? Authorizing Provider  azelastine (ASTELIN) 137 MCG/SPRAY nasal spray Place 2 sprays into the nose daily. Use in each nostril as directed 04/23/13 08/04/14 Yes Clinton D Young, MD  budesonide (PULMICORT) 0.25 MG/2ML nebulizer solution Take  2 mLs (0.25 mg total) by nebulization 2 (two) times daily. 07/15/13  Yes Vassie Loll, MD  ciprofloxacin (CIPRO) 500 MG tablet Take 1 tablet (500 mg total) by mouth 2 (two) times daily. 07/15/13 07/22/13 Yes Vassie Loll, MD  clopidogrel (PLAVIX) 75 MG tablet Take 75 mg by mouth daily.    Yes Historical Provider, MD  colchicine 0.6 MG tablet Take 0.6 mg by mouth 2 (two) times daily.  06/16/13  Yes Adeline Joselyn Glassman, MD  cyclobenzaprine (FLEXERIL) 10 MG tablet Take 10 mg by mouth daily.    Yes Historical Provider, MD  diltiazem (CARDIZEM CD) 360 MG 24 hr capsule Take 1 capsule (360 mg total) by mouth daily. 07/15/13  Yes Vassie Loll, MD  furosemide (LASIX) 40 MG tablet Take 0.5 tablets (20 mg total) by mouth daily. 07/15/13  Yes Vassie Loll, MD  glimepiride (AMARYL) 4 MG tablet Take 4 mg by mouth daily with breakfast. Once a day   Yes Historical Provider, MD  influenza vac split quadrivalent PF (FLUARIX) 0.5 ML injection Inject 0.5 mLs into the muscle once.   Yes Historical Provider, MD  insulin glargine (LANTUS) 100 UNIT/ML injection Inject 30 Units into the skin at bedtime as needed (for CBG over 200).    Yes Historical Provider, MD  isosorbide mononitrate (IMDUR) 15 mg TB24 24 hr tablet Take 0.5 tablets (15 mg total) by mouth daily. 07/15/13  Yes Vassie Loll, MD  levalbuterol Pauline Aus) 0.63 MG/3ML nebulizer solution Take 3 mLs (0.63 mg total) by nebulization every 6 (six) hours as needed for wheezing or shortness of breath. 07/15/13  Yes Vassie Loll, MD  levothyroxine (SYNTHROID, LEVOTHROID) 25 MCG tablet Take 25 mcg by mouth See admin instructions. Take 1 tablet (25 mcg) on Monday, Wednesday, Friday (take 50 mcg on Tues, Thurs, Sat, Sun)   Yes Historical Provider, MD  levothyroxine (SYNTHROID, LEVOTHROID) 50 MCG tablet Take 50 mcg by mouth See admin instructions. Take 1 tablet (50 mcg) on Tuesday, Thursday, Saturday, Sunday (take 25 mcg on Monday, Wednesday and Friday)   Yes Historical Provider, MD   loperamide (IMODIUM A-D) 2 MG tablet Take 2-4 mg by mouth 3 (three) times daily as needed for diarrhea or loose stools.   Yes Historical Provider, MD  LORazepam (ATIVAN) 0.5 MG tablet Take 0.25 mg by mouth 3 (three) times daily.   Yes Historical Provider, MD  metoprolol (LOPRESSOR) 50 MG tablet Take 1 tablet (50 mg total) by mouth 2 (two) times daily. 07/15/13  Yes Vassie Loll, MD  ondansetron (ZOFRAN) 4 MG tablet Take 4 mg by mouth every 8 (eight) hours as needed for nausea or vomiting.  Yes Historical Provider, MD  oxybutynin (DITROPAN XL) 10 MG 24 hr tablet Take 10 mg by mouth daily.    Yes Historical Provider, MD  OxyCODONE (OXYCONTIN) 20 mg T12A 12 hr tablet Take 20 mg by mouth every 12 (twelve) hours.   Yes Historical Provider, MD  ramelteon (ROZEREM) 8 MG tablet Take 8 mg by mouth at bedtime.   Yes Historical Provider, MD  ibandronate (BONIVA) 150 MG tablet Take 150 mg by mouth every 30 (thirty) days. Take in the morning with a full glass of water, on an empty stomach, and do not take anything else by mouth or lie down for the next 30 min. (on or about the 25th of each month)    Historical Provider, MD  lisinopril (PRINIVIL,ZESTRIL) 10 MG tablet Take 1 tablet (10 mg total) by mouth daily. Stop until follow up with PCP in 1 week 07/15/13   Vassie Loll, MD  nitroGLYCERIN (NITROSTAT) 0.4 MG SL tablet Place 0.4 mg under the tongue every 5 (five) minutes as needed for chest pain.     Historical Provider, MD  oxyCODONE-acetaminophen (PERCOCET/ROXICET) 5-325 MG per tablet Take 1-2 tablets by mouth every 6 (six) hours as needed for severe pain. 06/16/13   Kela Millin, MD    Physical Exam: Filed Vitals:   07/15/13 2000 07/15/13 2100 07/15/13 2141 07/15/13 2200  BP: 106/49 125/70 125/70 125/61  Pulse: 64 78 80 85  TempSrc:      Resp: 28 21 21 25   Height:      Weight:      SpO2: 99% 100% 100% 100%    General: Alert, Awake and Oriented to Time, Place and Person. Appear in marked  distress Eyes: PERRL ENT: Oral Mucosa clear moist. Neck: Difficult to assess JVD Cardiovascular: S1 and S2 Present, Aortic Murmur, Peripheral Pulses Present Respiratory: Bilateral Air entry equal and Decreased, Right more than left Crackles,Expiratory wheezes Abdomen: Bowel Sound Present, Soft and Non tender Skin: no Rash Extremities: Bilateral Pedal edema, ni calf tenderness Neurologic: Grossly Unremarkable.  Labs on Admission:  CBC:  Recent Labs Lab 07/09/13 1542 07/09/13 2230 07/10/13 0435 07/11/13 0504 07/12/13 0410 07/15/13 1909 07/15/13 2301  WBC 9.2 10.1 7.1 4.8 5.2 12.5*  --   NEUTROABS 7.3  --  5.7  --   --  11.0*  --   HGB 11.6* 10.3* 9.1* 8.7* 8.9* 8.8* 8.5*  HCT 33.5* 30.3* 26.7* 24.7* 25.7* 27.4* 25.0*  MCV 84.8 87.1 85.9 83.4 86.0 90.7  --   PLT 313 295 267 226 213 268  --     CMP     Component Value Date/Time   NA 135 07/15/2013 2301   K 5.3* 07/15/2013 2301   CL 102 07/15/2013 2301   CO2 20 07/15/2013 2101   GLUCOSE 173* 07/15/2013 2301   BUN 43* 07/15/2013 2301   CREATININE 2.10* 07/15/2013 2301   CALCIUM 8.2* 07/15/2013 2101   PROT 5.7* 07/10/2013 0435   ALBUMIN 2.4* 07/13/2013 0606   AST 19 07/10/2013 0435   ALT 25 07/10/2013 0435   ALKPHOS 120* 07/10/2013 0435   BILITOT 0.2* 07/10/2013 0435   GFRNONAA 26* 07/15/2013 2101   GFRAA 31* 07/15/2013 2101    No results found for this basename: LIPASE, AMYLASE,  in the last 168 hours No results found for this basename: AMMONIA,  in the last 168 hours   Recent Labs Lab 07/09/13 2230  CKTOTAL 57  TROPONINI <0.30   BNP (last 3 results)  Recent Labs  06/07/13 1300 06/13/13 0406 07/15/13 2101  PROBNP 3554.0* 1503.0* 31366.0*    Radiological Exams on Admission: Dg Chest Port 1 View  07/15/2013   CLINICAL DATA:  Respiratory failure and fatigue. CHF. COPD. Diabetes.  EXAM: PORTABLE CHEST - 1 VIEW  COMPARISON:  07/11/2013  FINDINGS: Right glenohumeral joint arthroplasty. Cardiomegaly accentuated by AP  portable technique. Advanced aortic atherosclerosis. Possible small pleural effusions. No pneumothorax. Patchy right upper and right lower lobe airspace disease. Minimal left base airspace disease.  IMPRESSION: Markedly worsened aeration, with right greater than left bilateral airspace disease. Favor infection or aspiration. Asymmetric pulmonary edema felt less likely.  Cardiomegaly with atherosclerosis and probable small bilateral pleural effusions.   Electronically Signed   By: Jeronimo Greaves M.D.   On: 07/15/2013 19:39    EKG: Independently reviewed. sinus tachycardia.  Assessment/Plan Principal Problem:   HCAP (healthcare-associated pneumonia) Active Problems:   HYPOTHYROIDISM   DM   COPD with acute exacerbation   Acute on chronic diastolic heart failure   SOB (shortness of breath)   1. HCAP (healthcare-associated pneumonia) The patient has leukocytosis, shortness of breath, cough, acute On chronic hypoxic respiratory failure requiring BiPAP,Chest x-ray suggestive of right-sided infiltrate likely be presenting pneumonia.She is recently discharged from the hospital. There is also a possibility that she could have aspirated due to right-sided infiltrate, and her recent admission for nausea and vomiting. Based on that she will be admitted to step down unit, she will be given IV Solu-Medrol, IV vancomycin, IV aztreonam. Also she will be given DuoNeb and oxygen as needed. A flutter device will also be given to her.  2.Acute on chronic diastolic heart failure The patient has history of A. Fib and during her last hospitalization has been taken off her amiodarone. Although she appears in Sinus with rate controlled at present. But she appeared to have Gain 8 kg in her last hospitalization and her proBNP is also 30,000. She is been given 40 mg of IV Lasix and a Foley catheter for monitoring of urine output. If she does not respond to this dose she may require further IV Lasix. From tomorrow I will  put her on 40 mg twice a day Lasix.  3.A. Fib Currently appearing in sinus rhythm, continue to monitor, holding Cardizem at present due to borderline blood pressure.  4.Diabetes mellitus Continue home insulin with sliding scale  5. Hypothyroidism Continue Synthroid  DVT Prophylaxis: subcutaneous Heparin Nutrition: N.p.o. Until speech evaluation done  Code Status: Full  Family Communication: Family was present at bedside, opportunity was given to ask question and all questions were answered satisfactorily at the time of interview. Disposition: Admitted to inpatient in step-down unit.  Author: Lynden Oxford, MD [facility] Pager: [phone] 07/15/2013, 11:40 PM    If 7PM-7AM, please contact night-coverage [email or web address] Password [email or web address]

## 2013-07-15 NOTE — Progress Notes (Signed)
SUBJECTIVE:  Able to lie flat.  OBJECTIVE:   Vitals:   Filed Vitals:   07/15/13 0500 07/15/13 1046 07/15/13 1214 07/15/13 1422  BP: 141/73 130/73  117/55  Pulse: 54 111  90  Temp: 98 F (36.7 C) 98.1 F (36.7 C)  97.8 F (36.6 C)  TempSrc: Oral Oral  Oral  Resp: 20 20  20   Height:      Weight: 194 lb 10.7 oz (88.3 kg)     SpO2: 93% 93% 93% 89%   I&O's:    Intake/Output Summary (Last 24 hours) at 07/15/13 1639 Last data filed at 07/15/13 1515  Gross per 24 hour  Intake   1923 ml  Output    551 ml  Net   1372 ml   TELEMETRY: Reviewed telemetry pt in atrial fibrillation wt 90-113 0bpm:     PHYSICAL EXAM General: Well developed, well nourished, in no acute distress Head: Eyes PERRLA, No xanthomas.   Normal cephalic and atramatic  Lungs:   Mild wheezing  bilaterally  Heart:   Irregularly irregular S1 S2 Pulses are 2+ & equal. Abdomen: Bowel sounds are positive, abdomen soft and non-tender without masses  Extremities:  Tr edema.  DP +1 Neuro: Alert and oriented X 3. Psych:  Flat affect, responds appropriately   LABS: Basic Metabolic Panel:  Recent Labs  19/14/78 0606 07/15/13 0625  NA 138 135  K 4.3 5.4*  CL 102 101  CO2 26 24  GLUCOSE 145* 174*  BUN 50* 40*  CREATININE 1.53* 1.50*  CALCIUM 7.8* 8.3*  PHOS 3.1  --    Liver Function Tests:  Recent Labs  07/13/13 0606  ALBUMIN 2.4*   No results found for this basename: LIPASE, AMYLASE,  in the last 72 hours CBC: No results found for this basename: WBC, NEUTROABS, HGB, HCT, MCV, PLT,  in the last 72 hours Cardiac Enzymes: No results found for this basename: CKTOTAL, CKMB, CKMBINDEX, TROPONINI,  in the last 72 hours BNP: No components found with this basename: POCBNP,  D-Dimer: No results found for this basename: DDIMER,  in the last 72 hours Hemoglobin A1C: No results found for this basename: HGBA1C,  in the last 72 hours Fasting Lipid Panel: No results found for this basename: CHOL, HDL,  LDLCALC, TRIG, CHOLHDL, LDLDIRECT,  in the last 72 hours Thyroid Function Tests: No results found for this basename: TSH, T4TOTAL, FREET3, T3FREE, THYROIDAB,  in the last 72 hours Anemia Panel: No results found for this basename: VITAMINB12, FOLATE, FERRITIN, TIBC, IRON, RETICCTPCT,  in the last 72 hours Coag Panel:   Lab Results  Component Value Date   INR 1.19 06/10/2013   INR 1.26 06/09/2013   INR 1.39 06/08/2013    RADIOLOGY: Ct Abdomen Pelvis Wo Contrast  07/09/2013   CLINICAL DATA:  Abdominal pain. Nausea. Diarrhea. Acute renal failure.  EXAM: CT ABDOMEN AND PELVIS WITHOUT CONTRAST  TECHNIQUE: Multidetector CT imaging of the abdomen and pelvis was performed following the standard protocol without intravenous contrast.  COMPARISON:  05/20/2007  FINDINGS: Surgical clips noted from prior cholecystectomy. Noncontrast images of the liver, spleen, pancreas, and adrenal glands are normal in appearance. No evidence of renal calculi or hydronephrosis.  No soft tissue masses or lymphadenopathy identified within the abdomen or pelvis. Prior hysterectomy noted. Adnexal regions are unremarkable. Foley catheter seen within the bladder, and a pessary noted in the vagina. Sigmoid diverticulosis is noted, however there is no evidence of diverticulitis. No evidence of inflammatory process or abnormal fluid  collections.  IMPRESSION: No acute findings.  No evidence of hydronephrosis.  Diverticulosis. No radiographic evidence of diverticulitis.   Electronically Signed   By: Myles Rosenthal M.D.   On: 07/09/2013 22:42   Dg Chest 2 View  07/09/2013   CLINICAL DATA:  Abdominal pain with nausea and weakness, recent history of pneumonia  EXAM: CHEST  2 VIEW  COMPARISON:  CT scan of the chest of June 07, 2013.  FINDINGS: The lungs are well-expanded. The posterior right lower lobe infiltrate demonstrated on the previous CT scan is not evident on this chest x-ray. The cardiac silhouette is top-normal in size. The  pulmonary vascularity is not engorged. There is no pleural effusion. There is no pneumothorax. There is mild loss of height of multiple mid and lower thoracic vertebral bodies without evidence of high-grade which compression. The observed portions of the upper abdomen reveal no definite abnormal gas collection.  IMPRESSION: 1. There is no evidence of residual pneumonia in the right lower lobe. 2. There is borderline enlargement of cardiac silhouette without overt evidence of CHF.   Electronically Signed   By: David  [place]   On: 07/09/2013 16:49   Dg Chest Port 1 View  07/11/2013   CLINICAL DATA:  Hypoxia.  EXAM: PORTABLE CHEST - 1 VIEW  COMPARISON:  07/09/2013.  FINDINGS: Right lower lobe infiltrate has cleared. Minimal basilar atelectasis present. Heart size is stable. Pulmonary vascularity is normal. Right shoulder replacement. Degenerative changes left shoulder.  IMPRESSION: Previously identified right lower lobe infiltrate has cleared. Mild basilar atelectasis.   Electronically Signed   By: Maisie Fus  Register   On: 07/11/2013 15:04   Assessment/Plan:  1.Atrial Fibrillation with rapid ventricular response rate. Currently on  Cardizem 360mg /day for rate control but not on anti-coagulation due to history of multiple falls in the past.  - Per primary team's request, stopped amiodarone to see if her nausea gets better. Increased Cardizem as tolerated by blood pressure for rate control. Titrate Metoprolol as tolerated by blood pressure to keep heart rate less than 110 bpm. No Digoxin at this time due to renal failure.  She was on sotalol in the past but this was stopped due to renal insufficiency.  She was switched to amiodarone.  EP did not think Amio was cause of nausea. - Not a candidate for TEE/cardioversion and/or AFIB ablation unless she takes anticoagulation. - check free TSH/T4 levels since she is on Synthroid.  2.  Nausea and vomiting secondary to amiodarone - better, but she still does not feel  well. 3.  Acute on chronic renal insuff secondary to #2. Improving dramatically.  When she stays in AFib for too long, she has had diastolic heart failure in the past.  Consult EP.  She has seen Dr. Johney Frame in the past.    She does not want to take Coumadin.  Discussed again today.       Corky Crafts., MD  07/15/2013  4:39 PM

## 2013-07-15 NOTE — ED Notes (Signed)
CRITICAL VALUE ALERT  Critical value received:  K+ 6.8  Date of notification:  07/15/13  Time of notification:  2208  Critical value read back:yes  Nurse who received alert:  Lawanna Kobus  MD notified (1st page): In the unit  Time of first page: 2208  MD notified (2nd page):  Time of second page:  Responding MD:  Post  Time MD responded: 2209

## 2013-07-15 NOTE — Progress Notes (Signed)
Pt. C/o SOB breath during the night. O2 sats down to 75-89 % with exertion. On call NP, K. Schorr, made aware. New orders received. RT notified. RN will continue to monitor pt. For changes in condition. Safira Proffit, Cheryll Dessert

## 2013-07-15 NOTE — Progress Notes (Deleted)
Patient's IV and telemetry has been discontinued, the patient is being discharged home via EMS. Lorretta Harp RN

## 2013-07-15 NOTE — Discharge Summary (Signed)
Physician Discharge Summary  [name] ZOX:[id number] DOB: May 14, 1934 DOA: 07/09/2013  PCP: Gweneth Dimitri, MD  Admit date: 07/09/2013 Discharge date: 07/15/2013  Time spent: >30 minutes  Recommendations for Outpatient Follow-up:  1. BMET to follow electrolytes and renal function 2. Reassess BP and adjust medications as needed 3. Patient will follow with cardiology (Dr. Johney Frame) for further evaluation and treatment of her a. fib   Discharge Diagnoses:  Principal Problem:   Acute renal failure Active Problems:   HYPOTHYROIDISM   DM   Atrial fibrillation   Metabolic acidosis   Nausea vomiting and diarrhea   Protein-calorie malnutrition, severe   Discharge Condition: stable and improved. Discharge home with [facility] services. Will benefit of [facility] care at home to minimize readmission rate.  Diet recommendation: low sodium heart healthy  Filed Weights   07/13/13 0622 07/14/13 0543 07/15/13 0500  Weight: 86 kg (189 lb 9.5 oz) 85.548 kg (188 lb 9.6 oz) 88.3 kg (194 lb 10.7 oz)    History of present illness:  79 y.o. female with Past medical history of CAD CHF atrial fibrillation hypertension COPD sleep apnea on CPAP diabetes. The patient is coming from home.  The patient was discharged from the hospital today after her admission for Nausea and vomiting Which was thought to be secondary to amiodarone and it was stopped. She also had acute kidney injury for which her Lasix lisinopril and colchicine were hold and she was given IV hydration.  After the discharge the patient went at home and she couldn't come out of the car due to severe weakness and shortness of breath at lied down on the ground.Since the family couldn't help the patient, EMS was called.  At the time of my evaluation the patient denied any complaint of chest pain, nausea, vomiting, abdominal pain, burning urination, leg swelling or calf tenderness.  Hospital Course:  Atrial fibrillation with RVR  - Rate control  improved -plan as per cardiology recommendations is to continue diltiazem (at adjusted dose) and to use metoprolol -EP/cardiology will follow patient in outpatient setting for further evaluation and treatment. -patient has refused anticoagulation several times. - Not a candidate for TEE/cardioversion and/or AFIB ablation unless she takes anticoagulation.   - takes Plavix which has been resumed - not on anti-coagulation due to history of multiple falls in the past and personal decision.  Acute renal failure on CKD stage III  - Prerenal and ATN due to use of lasix, lisinopril and colchicine along with dehydration from nausea and vomiting.  - Resolved with hydration. Cr back to baseline.  - Baseline cr. appears to be 1.4 - 1.7  - Resume ACE in 1 week.   Nausea vomiting and diarrhea  - CT of the abdomen without contrast is negative  - Campilobacter positive; will be treated with cipro, diarrhea improved. Plan is to treat for a total of 10 days.  - C. difficile negative  - symptoms also significantly better after stopping amiodarone   Diastolic CHF - Grade 2  - well compensated at present  -low sodium and fluid restriction discussed with patient -plan is to enroll her on [facility] care for further close follow up and decrease readmissions  Protein-calorie malnutrition, severe  -intake improving  -continue feeding supplement   HYPOTHYROIDISM  - Continue Synthroid   DM  -CBG is well controlled during hospitalization -will resume home regimen and further medication adjustments to be done by PCP  Metabolic acidosis  -Due to acute renal failure and diarrhea - resolved as renal  function improved  -BMET to be follow during follow up visit with PCP  COPD on chronic oxygen with secondary pulmonary hypertension  -Currently no exacerbation  -will use pulmicort nebulizer for better control; patient reports having trouble with inhaler -albuterol changed to xopenex due to A. fib  Obstructive sleep  apnea  -continue CPAP QHS  Procedures:    Consultations:  cardiology  Discharge Exam: Filed Vitals:   07/15/13 1422  BP: 117/55  Pulse: 90  Temp: 97.8 F (36.6 C)  Resp: 20   General: Alert, awake, oriented x3, in no acute distress.  HEENT: No bruits, no goiter.  Heart: S1 and S2, tachycardia, no murmur, no rubs or gallops Lungs: Good air movement, clear to auscultation bilaterally.  Abdomen: Soft, nontender, nondistended, positive bowel sounds.    Discharge Instructions  Discharge Orders   Future Appointments Provider Department Dept Phone   08/25/2013 3:30 PM [facility] [place] Office [phone]   09/04/2013 12:45 PM Melony Overly, MD [facility] [phone]   10/21/2013 1:45 PM Everette Rank, MD [facility] [phone]   10/22/2013 1:30 PM Waymon Budge, MD [facility] [phone]   Future Orders Complete By Expires   Diet - low sodium heart healthy  As directed    Discharge instructions  As directed    Comments:     Take medications as prescribed Follow a low sodium diet Keep yourself well hydrated Arrange follow up with PCP in 1 week Please stop Lisinopril until follow up with PCP       Medication List    STOP taking these medications       albuterol 108 (90 BASE) MCG/ACT inhaler  Commonly known as:  VENTOLIN HFA     amiodarone 200 MG tablet  Commonly known as:  PACERONE     amLODipine 5 MG tablet  Commonly known as:  NORVASC     budesonide-formoterol 160-4.5 MCG/ACT inhaler  Commonly known as:  SYMBICORT     diltiazem 120 MG tablet  Commonly known as:  CARDIZEM     nitrofurantoin (macrocrystal-monohydrate) 100 MG capsule  Commonly known as:  MACROBID      TAKE these medications       azelastine 137 MCG/SPRAY nasal spray  Commonly known as:  ASTELIN  Place 2 sprays into the nose daily. Use in each nostril as directed     budesonide 0.25 MG/2ML nebulizer solution   Commonly known as:  PULMICORT  Take 2 mLs (0.25 mg total) by nebulization 2 (two) times daily.     ciprofloxacin 500 MG tablet  Commonly known as:  CIPRO  Take 1 tablet (500 mg total) by mouth 2 (two) times daily.     clopidogrel 75 MG tablet  Commonly known as:  PLAVIX  Take 75 mg by mouth daily.     colchicine 0.6 MG tablet  Take 0.6 mg by mouth 2 (two) times daily.     cyclobenzaprine 10 MG tablet  Commonly known as:  FLEXERIL  Take 10 mg by mouth daily.     diltiazem 360 MG 24 hr capsule  Commonly known as:  CARDIZEM CD  Take 1 capsule (360 mg total) by mouth daily.     DITROPAN XL 10 MG 24 hr tablet  Generic drug:  oxybutynin  Take 10 mg by mouth daily.     furosemide 40 MG tablet  Commonly known as:  LASIX  Take 0.5 tablets (20 mg total) by mouth daily.  glimepiride 4 MG tablet  Commonly known as:  AMARYL  Take 4 mg by mouth daily with breakfast. Once a day     ibandronate 150 MG tablet  Commonly known as:  BONIVA  Take 150 mg by mouth every 30 (thirty) days. Take in the morning with a full glass of water, on an empty stomach, and do not take anything else by mouth or lie down for the next 30 min. (on or about the 25th of each month)     isosorbide mononitrate 15 mg Tb24 24 hr tablet  Commonly known as:  IMDUR  Take 0.5 tablets (15 mg total) by mouth daily.     LANTUS 100 UNIT/ML injection  Generic drug:  insulin glargine  Inject 30 Units into the skin at bedtime as needed (for CBG over 200).     levalbuterol 0.63 MG/3ML nebulizer solution  Commonly known as:  XOPENEX  Take 3 mLs (0.63 mg total) by nebulization every 6 (six) hours as needed for wheezing or shortness of breath.     levothyroxine 50 MCG tablet  Commonly known as:  SYNTHROID, LEVOTHROID  Take 50 mcg by mouth See admin instructions. Take 1 tablet (50 mcg) on Tuesday, Thursday, Saturday, Sunday (take 25 mcg on Monday, Wednesday and Friday)     levothyroxine 25 MCG tablet  Commonly known  as:  SYNTHROID, LEVOTHROID  Take 25 mcg by mouth See admin instructions. Take 1 tablet (25 mcg) on Monday, Wednesday, Friday (take 50 mcg on Tues, Thurs, Sat, Sun)     lisinopril 10 MG tablet  Commonly known as:  PRINIVIL,ZESTRIL  Take 1 tablet (10 mg total) by mouth daily. Stop until follow up with PCP in 1 week     loperamide 2 MG tablet  Commonly known as:  IMODIUM A-D  Take 2-4 mg by mouth 3 (three) times daily as needed for diarrhea or loose stools.     LORazepam 0.5 MG tablet  Commonly known as:  ATIVAN  Take 0.25 mg by mouth 3 (three) times daily.     metoprolol 50 MG tablet  Commonly known as:  LOPRESSOR  Take 1 tablet (50 mg total) by mouth 2 (two) times daily.     nitroGLYCERIN 0.4 MG SL tablet  Commonly known as:  NITROSTAT  Place 0.4 mg under the tongue every 5 (five) minutes as needed for chest pain.     ondansetron 4 MG tablet  Commonly known as:  ZOFRAN  Take 4 mg by mouth every 8 (eight) hours as needed for nausea or vomiting.     OxyCODONE 20 mg T12a 12 hr tablet  Commonly known as:  OXYCONTIN  Take 20 mg by mouth every 12 (twelve) hours.     oxyCODONE-acetaminophen 5-325 MG per tablet  Commonly known as:  PERCOCET/ROXICET  Take 1-2 tablets by mouth every 6 (six) hours as needed for severe pain.     ramelteon 8 MG tablet  Commonly known as:  ROZEREM  Take 8 mg by mouth at bedtime.       Allergies  Allergen Reactions  . Cefuroxime Axetil Swelling and Rash    Throat swelling  . Cephalexin Swelling and Rash    Throat swelling  . Clarithromycin Swelling and Rash    Throat swelling  . Doxycycline Swelling and Rash     rash on legs and feet, throat swelling  . Erythromycin Swelling and Rash    Throat swelling  . Penicillins Swelling and Rash    Throat swelling  .  Tetracycline Swelling and Rash    Throat swelling       Follow-up Information   Follow up with MCNEILL,WENDY, MD. Schedule an appointment as soon as possible for a visit in 1 week.    Specialty:  Family Medicine   Contact information:   [address] [place] [address] [phone]       The results of significant diagnostics from this hospitalization (including imaging, microbiology, ancillary and laboratory) are listed below for reference.    Significant Diagnostic Studies: Ct Abdomen Pelvis Wo Contrast  07/09/2013   CLINICAL DATA:  Abdominal pain. Nausea. Diarrhea. Acute renal failure.  EXAM: CT ABDOMEN AND PELVIS WITHOUT CONTRAST  TECHNIQUE: Multidetector CT imaging of the abdomen and pelvis was performed following the standard protocol without intravenous contrast.  COMPARISON:  05/20/2007  FINDINGS: Surgical clips noted from prior cholecystectomy. Noncontrast images of the liver, spleen, pancreas, and adrenal glands are normal in appearance. No evidence of renal calculi or hydronephrosis.  No soft tissue masses or lymphadenopathy identified within the abdomen or pelvis. Prior hysterectomy noted. Adnexal regions are unremarkable. Foley catheter seen within the bladder, and a pessary noted in the vagina. Sigmoid diverticulosis is noted, however there is no evidence of diverticulitis. No evidence of inflammatory process or abnormal fluid collections.  IMPRESSION: No acute findings.  No evidence of hydronephrosis.  Diverticulosis. No radiographic evidence of diverticulitis.   Electronically Signed   By: Myles Rosenthal M.D.   On: 07/09/2013 22:42   Dg Chest 2 View  07/09/2013   CLINICAL DATA:  Abdominal pain with nausea and weakness, recent history of pneumonia  EXAM: CHEST  2 VIEW  COMPARISON:  CT scan of the chest of June 07, 2013.  FINDINGS: The lungs are well-expanded. The posterior right lower lobe infiltrate demonstrated on the previous CT scan is not evident on this chest x-ray. The cardiac silhouette is top-normal in size. The pulmonary vascularity is not engorged. There is no pleural effusion. There is no pneumothorax. There is mild loss of height of multiple mid  and lower thoracic vertebral bodies without evidence of high-grade which compression. The observed portions of the upper abdomen reveal no definite abnormal gas collection.  IMPRESSION: 1. There is no evidence of residual pneumonia in the right lower lobe. 2. There is borderline enlargement of cardiac silhouette without overt evidence of CHF.   Electronically Signed   By: David  [place]   On: 07/09/2013 16:49   Dg Chest Port 1 View  07/11/2013   CLINICAL DATA:  Hypoxia.  EXAM: PORTABLE CHEST - 1 VIEW  COMPARISON:  07/09/2013.  FINDINGS: Right lower lobe infiltrate has cleared. Minimal basilar atelectasis present. Heart size is stable. Pulmonary vascularity is normal. Right shoulder replacement. Degenerative changes left shoulder.  IMPRESSION: Previously identified right lower lobe infiltrate has cleared. Mild basilar atelectasis.   Electronically Signed   By: Maisie Fus  Register   On: 07/11/2013 15:04    Microbiology: Recent Results (from the past 240 hour(s))  CULTURE, BLOOD (ROUTINE X 2)     Status: None   Collection Time    07/09/13  1:21 AM      Result Value Range Status   Specimen Description BLOOD RIGHT ARM   Final   Special Requests BOTTLES DRAWN AEROBIC AND ANAEROBIC 10CC EACH   Final   Culture  Setup Time     Final   Value: 07/10/2013 08:57     Performed at [facility]   Culture     Final  Value:        BLOOD CULTURE RECEIVED NO GROWTH TO DATE CULTURE WILL BE HELD FOR 5 DAYS BEFORE ISSUING A FINAL NEGATIVE REPORT     Performed at [facility]   Report Status PENDING   Incomplete  CLOSTRIDIUM DIFFICILE BY PCR     Status: None   Collection Time    07/09/13  4:16 PM      Result Value Range Status   C difficile by pcr NEGATIVE  NEGATIVE Final  URINE CULTURE     Status: None   Collection Time    07/09/13 10:40 PM      Result Value Range Status   Specimen Description URINE, CATHETERIZED   Final   Special Requests NONE   Final   Culture  Setup Time     Final   Value:  07/10/2013 09:06     Performed at [facility] Count     Final   Value: NO GROWTH     Performed at [facility]   Culture     Final   Value: NO GROWTH     Performed at [facility]   Report Status 07/11/2013 FINAL   Final  MRSA PCR SCREENING     Status: None   Collection Time    07/09/13 10:40 PM      Result Value Range Status   MRSA by PCR NEGATIVE  NEGATIVE Final   Comment:            The GeneXpert MRSA Assay (FDA     approved for NASAL specimens     only), is one component of a     comprehensive MRSA colonization     surveillance program. It is not     intended to diagnose MRSA     infection nor to guide or     monitor treatment for     MRSA infections.  CULTURE, BLOOD (ROUTINE X 2)     Status: None   Collection Time    07/10/13  1:14 AM      Result Value Range Status   Specimen Description BLOOD LEFT HAND   Final   Special Requests BOTTLES DRAWN AEROBIC AND ANAEROBIC 10CC EACH   Final   Culture  Setup Time     Final   Value: 07/10/2013 08:57     Performed at [facility]   Culture     Final   Value:        BLOOD CULTURE RECEIVED NO GROWTH TO DATE CULTURE WILL BE HELD FOR 5 DAYS BEFORE ISSUING A FINAL NEGATIVE REPORT     Performed at [facility]   Report Status PENDING   Incomplete     Labs: Basic Metabolic Panel:  Recent Labs Lab 07/10/13 0435 07/11/13 0504 07/11/13 1550 07/12/13 0410 07/13/13 0606 07/15/13 0625  NA 131* 133*  --  137 138 135  K 4.2 3.0* 4.0 3.9 4.3 5.4*  CL 92* 93*  --  100 102 101  CO2 10* 27  --  28 26 24   GLUCOSE 154* 100*  --  125* 145* 174*  BUN 114* 95*  --  76* 50* 40*  CREATININE 7.18* 4.22*  --  2.56* 1.53* 1.50*  CALCIUM 6.4* 6.0*  --  7.2* 7.8* 8.3*  MG  --  1.2* 1.7 1.7  --   --   PHOS  --   --   --  4.2 3.1  --  Liver Function Tests:  Recent Labs Lab 07/09/13 1542 07/10/13 0435 07/12/13 0410 07/13/13 0606  AST 24 19  --   --   ALT 33 25  --   --   ALKPHOS 150*  120*  --   --   BILITOT 0.2* 0.2*  --   --   PROT 7.2 5.7*  --   --   ALBUMIN 3.1* 2.4* 2.4* 2.4*   CBC:  Recent Labs Lab 07/09/13 1542 07/09/13 2230 07/10/13 0435 07/11/13 0504 07/12/13 0410  WBC 9.2 10.1 7.1 4.8 5.2  NEUTROABS 7.3  --  5.7  --   --   HGB 11.6* 10.3* 9.1* 8.7* 8.9*  HCT 33.5* 30.3* 26.7* 24.7* 25.7*  MCV 84.8 87.1 85.9 83.4 86.0  PLT 313 295 267 226 213   Cardiac Enzymes:  Recent Labs Lab 07/09/13 2230  CKTOTAL 57  TROPONINI <0.30   BNP: BNP (last 3 results)  Recent Labs  06/07/13 1300 06/13/13 0406  PROBNP 3554.0* 1503.0*   CBG:  Recent Labs Lab 07/14/13 1204 07/14/13 1608 07/14/13 2117 07/15/13 0632 07/15/13 1108  GLUCAP 144* 136* 142* 149* 136*    Signed:  Keimya Briddell  [facility] 07/15/2013, 3:29 PM

## 2013-07-15 NOTE — Progress Notes (Signed)
Pt placed on VM due to pt mouth breathing. MD at bedside. Clear with slight rhonchi heard bilaterally. Pt encouraged to cough. Congested cough. RT will continue to monitor.

## 2013-07-15 NOTE — ED Notes (Signed)
Pt arrived by [facility] from home for weakness and SHOB. EMS reports pt was d/c'd from [facility] today after a 2 week stay for A-fib, PNA, SHOB, and weakness. EMS reports pt nearly fell when transferring d/t extreme weakness

## 2013-07-15 NOTE — Progress Notes (Signed)
Patient's IV and telemetry has been discontinued, patient and family members verbalizes understanding of discharge instructions and is being discharged home with family. Lorretta Harp RN

## 2013-07-15 NOTE — Progress Notes (Signed)
Occupational Therapy Treatment Patient Details Name: [name] MRN: [id number] DOB: 12/20/1933 Today's Date: 07/15/2013 Time: 0919-0950 OT Time Calculation (min): 31 min  OT Assessment / Plan / Recommendation  History of present illness [name] is a 79 y.o. female who was recently admitted last month for CHF/pneumonia and discharged to nursing home has been having persistent nausea vomiting and diarrhea over the last 3 weeks. Last few days patient has been become very weak and was brought to the ER. Initially patient was found to be hypotensive and blood pressure improved with 1 L normal saline bolus. Patient's labs show elevated creatinine of around 7 with metabolic acidosis which is acute with creatinine of 1.8 last month. Patient also was found to have UTI in the nursing home and was placed on Macrobid few days ago. Patient has been having recurrent nausea vomiting and diarrhea with abdominal cramps over the last 3 weeks. Denies any fever chills chest pain shortness of breath. Patient has been admitted for further management.   OT comments  Pt continues to be limited by poor activity tolerance, even for seated activity.  Some impulsivity noted and decreased adherence to safety.  Pt states her husband has cardiac issues and her sister in law has mobility issues, but can be with her 24 hours.  Follow Up Recommendations  Home health OT;Supervision/Assistance - 24 hour    Barriers to Discharge       Equipment Recommendations  None recommended by OT    Recommendations for Other Services    Frequency Min 2X/week   Progress towards OT Goals Progress towards OT goals: Progressing toward goals  Plan  (questionable if pt's husband and sister in law can manage)    Precautions / Restrictions Precautions Precautions: Fall Restrictions Weight Bearing Restrictions: No   Pertinent Vitals/Pain No pain, VSS, on 3L 02    ADL  Grooming: Wash/dry hands;Set up Where Assessed  - Grooming: Unsupported sitting Upper Body Bathing: Minimal assistance Where Assessed - Upper Body Bathing: Unsupported sitting Upper Body Dressing: Supervision/safety Where Assessed - Upper Body Dressing: Unsupported sitting Lower Body Dressing: Maximal assistance Where Assessed - Lower Body Dressing: Unsupported sitting Toilet Transfer: Min guard Toilet Transfer Method: Sit to Barista: Bedside commode Toileting - Clothing Manipulation and Hygiene: +1 Total assistance Where Assessed - Glass blower/designer Manipulation and Hygiene: Standing Equipment Used: Gait belt;Rolling walker (3L 02) Transfers/Ambulation Related to ADLs: good hand placement ADL Comments: Pt limited by LE weakness stating " I can't lift my feet" and demonstrates poor activity tolerance limiting grooming to sitting.    OT Diagnosis:    OT Problem List:   OT Treatment Interventions:     OT Goals(current goals can now be found in the care plan section) Acute Rehab OT Goals Patient Stated Goal: go home Time For Goal Achievement: 07/22/13 Potential to Achieve Goals: Good  Visit Information  Last OT Received On: 07/15/13 Assistance Needed: +1 History of Present Illness: [name] is a 79 y.o. female who was recently admitted last month for CHF/pneumonia and discharged to nursing home has been having persistent nausea vomiting and diarrhea over the last 3 weeks. Last few days patient has been become very weak and was brought to the ER. Initially patient was found to be hypotensive and blood pressure improved with 1 L normal saline bolus. Patient's labs show elevated creatinine of around 7 with metabolic acidosis which is acute with creatinine of 1.8 last month. Patient also was found to have  UTI in the nursing home and was placed on Macrobid few days ago. Patient has been having recurrent nausea vomiting and diarrhea with abdominal cramps over the last 3 weeks. Denies any fever chills chest  pain shortness of breath. Patient has been admitted for further management.    Subjective Data      Prior Functioning       Cognition  Cognition Arousal/Alertness: Awake/alert Behavior During Therapy: Impulsive Overall Cognitive Status: Impaired/Different from baseline Area of Impairment: Safety/judgement;Awareness Safety/Judgement: Decreased awareness of safety;Decreased awareness of deficits General Comments: refused socks, nurse tech reports she gets up on her own without awaiting help    Mobility  Bed Mobility Bed Mobility: Supine to Sit;Sitting - Scoot to Edge of Bed Supine to Sit: 6: Modified independent (Device/Increase time);HOB elevated;With rails Sitting - Scoot to Edge of Bed: 6: Modified independent (Device/Increase time) Details for Bed Mobility Assistance: heavy dependence on rail Transfers Transfers: Sit to Stand;Stand to Sit Sit to Stand: 4: Min guard;With upper extremity assist Stand to Sit: 4: Min guard;With upper extremity assist    Exercises      Balance     End of Session OT - End of Session Activity Tolerance: Patient limited by fatigue Patient left: in chair;with call bell/phone within reach Nurse Communication:  (requesting  breathing tx)  GO     Evern Bio 07/15/2013, 10:03 AM [phone]

## 2013-07-15 NOTE — Progress Notes (Signed)
ANTIBIOTIC CONSULT NOTE - INITIAL  Pharmacy Consult for Vancomycin and Aztreonam Indication: pneumonia  Allergies  Allergen Reactions  . Cefuroxime Axetil Shortness Of Breath, Swelling and Rash    Throat swelling  . Cephalexin Shortness Of Breath, Swelling and Rash    Throat swelling  . Clarithromycin Shortness Of Breath, Swelling and Rash    Throat swelling  . Doxycycline Shortness Of Breath, Swelling and Rash     rash on legs and feet, throat swelling  . Erythromycin Shortness Of Breath, Swelling and Rash    Throat swelling  . Penicillins Shortness Of Breath, Swelling and Rash    Throat swelling  . Tetracycline Shortness Of Breath, Swelling and Rash    Throat swelling    Patient Measurements: Height: 5' (152.4 cm) Weight: 194 lb (87.998 kg) IBW/kg (Calculated) : 45.5  Vital Signs: Temp: 97.8 F (36.6 C) (12/03 1422) Temp src: Oral (12/03 1856) BP: 125/70 mmHg (12/03 2100) Pulse Rate: 78 (12/03 2100) Intake/Output from previous day:   Intake/Output from this shift:    Labs:  Recent Labs  07/13/13 0606 07/15/13 0625 07/15/13 1909  WBC  --   --  12.5*  HGB  --   --  8.8*  PLT  --   --  268  CREATININE 1.53* 1.50*  --    Estimated Creatinine Clearance: 31 ml/min (by C-G formula based on Cr of 1.5). No results found for this basename: VANCOTROUGH, Leodis Binet, VANCORANDOM, GENTTROUGH, GENTPEAK, GENTRANDOM, TOBRATROUGH, TOBRAPEAK, TOBRARND, AMIKACINPEAK, AMIKACINTROU, AMIKACIN,  in the last 72 hours   Microbiology: Recent Results (from the past 720 hour(s))  CULTURE, BLOOD (ROUTINE X 2)     Status: None   Collection Time    07/09/13  1:21 AM      Result Value Range Status   Specimen Description BLOOD RIGHT ARM   Final   Special Requests BOTTLES DRAWN AEROBIC AND ANAEROBIC 10CC EACH   Final   Culture  Setup Time     Final   Value: 07/10/2013 08:57     Performed at [facility]   Culture     Final   Value:        BLOOD CULTURE RECEIVED NO GROWTH TO  DATE CULTURE WILL BE HELD FOR 5 DAYS BEFORE ISSUING A FINAL NEGATIVE REPORT     Performed at [facility]   Report Status PENDING   Incomplete  CLOSTRIDIUM DIFFICILE BY PCR     Status: None   Collection Time    07/09/13  4:16 PM      Result Value Range Status   C difficile by pcr NEGATIVE  NEGATIVE Final  URINE CULTURE     Status: None   Collection Time    07/09/13 10:40 PM      Result Value Range Status   Specimen Description URINE, CATHETERIZED   Final   Special Requests NONE   Final   Culture  Setup Time     Final   Value: 07/10/2013 09:06     Performed at [facility] Count     Final   Value: NO GROWTH     Performed at [facility]   Culture     Final   Value: NO GROWTH     Performed at [facility]   Report Status 07/11/2013 FINAL   Final  MRSA PCR SCREENING     Status: None   Collection Time    07/09/13 10:40 PM  Result Value Range Status   MRSA by PCR NEGATIVE  NEGATIVE Final   Comment:            The GeneXpert MRSA Assay (FDA     approved for NASAL specimens     only), is one component of a     comprehensive MRSA colonization     surveillance program. It is not     intended to diagnose MRSA     infection nor to guide or     monitor treatment for     MRSA infections.  CULTURE, BLOOD (ROUTINE X 2)     Status: None   Collection Time    07/10/13  1:14 AM      Result Value Range Status   Specimen Description BLOOD LEFT HAND   Final   Special Requests BOTTLES DRAWN AEROBIC AND ANAEROBIC 10CC EACH   Final   Culture  Setup Time     Final   Value: 07/10/2013 08:57     Performed at [facility]   Culture     Final   Value:        BLOOD CULTURE RECEIVED NO GROWTH TO DATE CULTURE WILL BE HELD FOR 5 DAYS BEFORE ISSUING A FINAL NEGATIVE REPORT     Performed at [facility]   Report Status PENDING   Incomplete    Medical History: Past Medical History  Diagnosis Date  . CAD (coronary artery disease)   . CHF  (congestive heart failure)   . Paroxysmal atrial fibrillation   . Bradycardia   . Hypertension   . Renal artery stenosis   . COPD (chronic obstructive pulmonary disease)   . Obesity hypoventilation syndrome   . Pruritus   . Tracheobronchitis   . Acute rhinosinusitis   . Anemia   . Hypoxemia   . Peripheral edema   . Obesity, endogenous   . Diabetes mellitus   . Acid reflux disease   . Osteoarthritis, generalized   . Hypothyroidism   . Lung nodule     lingula  . OSA (obstructive sleep apnea)     NPSG 04/03/00--AHI 28.hr  . DVT (deep venous thrombosis) 2003    left leg  . Vaginal atrophy   . Hx of colonic polyps   . Acute diastolic heart failure   . Secondary pulmonary hypertension 06/09/2013    Medications:  Anti-infectives   Start     Dose/Rate Route Frequency Ordered Stop   07/15/13 2130  aztreonam (AZACTAM) 2 g in dextrose 5 % 50 mL IVPB     2 g 100 mL/hr over 30 Minutes Intravenous  Once 07/15/13 2124     07/15/13 2130  vancomycin (VANCOCIN) 1,760 mg in sodium chloride 0.9 % 500 mL IVPB     20 mg/kg  88 kg 250 mL/hr over 120 Minutes Intravenous  Once 07/15/13 2125       Assessment: 79 year old female just discharged earlier today after being treated for atrial fibrillation, PNA, and weakness.  She is readmitted for weakness and shortness of breath.  She has multiple drug allergies, and is to begin Vancomycin and Aztreonam for pneumonia coverage.  Note she has renal insufficiency.  Goal of Therapy:  Vancomycin trough level 15-20 mcg/ml  Plan:  Vancomycin 1250mg  IV q24h Aztreonam 1gm IV q8h Monitor renal function closely Follow available micro data  Estella Husk, Pharm.D., BCPS, AAHIVP Clinical Pharmacist Phone [phone] or [phone] 07/15/2013, 9:44 PM

## 2013-07-15 NOTE — ED Provider Notes (Signed)
CSN: [id number]     Arrival date & time 07/15/13  1851 History   First MD Initiated Contact with Patient 07/15/13 1855     Chief Complaint  Patient presents with  . Fatigue   (Consider location/radiation/quality/duration/timing/severity/associated sxs/prior Treatment) HPI  Chief complaint shortness of breath history provided by patient  Patient is a 79 year old female past medical history significant for CAD, CHF, A. fib and pneumonia discharge this morning who comes in with chief complaint shortness of breath. Patient states she was discharged after a two-week stay for A. fib, pneumonia this morning. Upon returning home patient states she's been getting progressively short of breath. This is been constant. Worsening. Is now severe shortness of breath. No treatments tried. Patient denies any associated symptoms such as chest pain diaphoresis etc. Shortness of breath has been present for several hours. When fails to improve EMS was called by family. For all other associated signs and symptoms refer to the review of systems section of this chart  Past Medical History  Diagnosis Date  . CAD (coronary artery disease)   . CHF (congestive heart failure)   . Paroxysmal atrial fibrillation   . Bradycardia   . Hypertension   . Renal artery stenosis   . COPD (chronic obstructive pulmonary disease)   . Obesity hypoventilation syndrome   . Pruritus   . Tracheobronchitis   . Acute rhinosinusitis   . Anemia   . Hypoxemia   . Peripheral edema   . Obesity, endogenous   . Diabetes mellitus   . Acid reflux disease   . Osteoarthritis, generalized   . Hypothyroidism   . Lung nodule     lingula  . OSA (obstructive sleep apnea)     NPSG 04/03/00--AHI 28.hr  . DVT (deep venous thrombosis) 2003    left leg  . Vaginal atrophy   . Hx of colonic polyps   . Acute diastolic heart failure   . Secondary pulmonary hypertension 06/09/2013   Past Surgical History  Procedure Laterality Date  . Lumbar  spine surgery    . Cholecystectomy    . Hemorroidectomy    . Repair wound fistula    . Right shoulder    . Elbow surgery    . Dilation and curettage of uterus    . Breast surgery  1998    lumpectomy  . Oophorectomy  1959    BSO-? ovarian cancer   Family History  Problem Relation Age of Onset  . Pneumonia Mother   . Heart disease Mother   . Allergies Mother   . Emphysema Mother   . Arthritis Mother   . Heart attack Father   . Stroke Sister   . Diabetes Sister   . Heart disease Sister   . Diabetes Brother   . Diabetes      grandfather  . Clotting disorder Brother   . Arthritis Sister   . Breast cancer      aunt  . Bone cancer      aunt   History  Substance Use Topics  . Smoking status: Former Smoker    Quit date: 08/13/1978  . Smokeless tobacco: Never Used  . Alcohol Use: No   OB History   Grav Para Term Preterm Abortions TAB SAB Ect Mult Living   0              Obstetric Comments   2 adopted children     Review of Systems  Constitutional: Negative for fatigue.  Respiratory: Positive for shortness  of breath.   Cardiovascular: Negative for chest pain.  Gastrointestinal: Negative for abdominal pain.  Genitourinary: Negative for dysuria.  Musculoskeletal: Negative for back pain.  Skin: Negative for rash.  Neurological: Negative for headaches.  Psychiatric/Behavioral: Negative for agitation.  All other systems reviewed and are negative.    Allergies  Cefuroxime axetil; Cephalexin; Clarithromycin; Doxycycline; Erythromycin; Penicillins; and Tetracycline  Home Medications   Current Outpatient Rx  Name  Route  Sig  Dispense  Refill  . azelastine (ASTELIN) 137 MCG/SPRAY nasal spray   Nasal   Place 2 sprays into the nose daily. Use in each nostril as directed   90 mL   3   . budesonide (PULMICORT) 0.25 MG/2ML nebulizer solution   Nebulization   Take 2 mLs (0.25 mg total) by nebulization 2 (two) times daily.   60 mL   12   . ciprofloxacin (CIPRO)  500 MG tablet   Oral   Take 1 tablet (500 mg total) by mouth 2 (two) times daily.   14 tablet   0   . clopidogrel (PLAVIX) 75 MG tablet   Oral   Take 75 mg by mouth daily.          . colchicine 0.6 MG tablet   Oral   Take 0.6 mg by mouth 2 (two) times daily.          . cyclobenzaprine (FLEXERIL) 10 MG tablet   Oral   Take 10 mg by mouth daily.          Marland Kitchen diltiazem (CARDIZEM CD) 360 MG 24 hr capsule   Oral   Take 1 capsule (360 mg total) by mouth daily.   30 capsule   1   . furosemide (LASIX) 40 MG tablet   Oral   Take 0.5 tablets (20 mg total) by mouth daily.         Marland Kitchen glimepiride (AMARYL) 4 MG tablet   Oral   Take 4 mg by mouth daily with breakfast. Once a day         . influenza vac split quadrivalent PF (FLUARIX) 0.5 ML injection   Intramuscular   Inject 0.5 mLs into the muscle once.         . insulin glargine (LANTUS) 100 UNIT/ML injection   Subcutaneous   Inject 30 Units into the skin at bedtime as needed (for CBG over 200).          . isosorbide mononitrate (IMDUR) 15 mg TB24 24 hr tablet   Oral   Take 0.5 tablets (15 mg total) by mouth daily.   30 tablet   1   . levalbuterol (XOPENEX) 0.63 MG/3ML nebulizer solution   Nebulization   Take 3 mLs (0.63 mg total) by nebulization every 6 (six) hours as needed for wheezing or shortness of breath.   3 mL   12   . levothyroxine (SYNTHROID, LEVOTHROID) 25 MCG tablet   Oral   Take 25 mcg by mouth See admin instructions. Take 1 tablet (25 mcg) on Monday, Wednesday, Friday (take 50 mcg on Tues, Thurs, Sat, Sun)         . levothyroxine (SYNTHROID, LEVOTHROID) 50 MCG tablet   Oral   Take 50 mcg by mouth See admin instructions. Take 1 tablet (50 mcg) on Tuesday, Thursday, Saturday, Sunday (take 25 mcg on Monday, Wednesday and Friday)         . loperamide (IMODIUM A-D) 2 MG tablet   Oral   Take 2-4 mg by mouth 3 (  three) times daily as needed for diarrhea or loose stools.         Marland Kitchen LORazepam  (ATIVAN) 0.5 MG tablet   Oral   Take 0.25 mg by mouth 3 (three) times daily.         . metoprolol (LOPRESSOR) 50 MG tablet   Oral   Take 1 tablet (50 mg total) by mouth 2 (two) times daily.   60 tablet   1   . ondansetron (ZOFRAN) 4 MG tablet   Oral   Take 4 mg by mouth every 8 (eight) hours as needed for nausea or vomiting.         Marland Kitchen oxybutynin (DITROPAN XL) 10 MG 24 hr tablet   Oral   Take 10 mg by mouth daily.          . OxyCODONE (OXYCONTIN) 20 mg T12A 12 hr tablet   Oral   Take 20 mg by mouth every 12 (twelve) hours.         . ramelteon (ROZEREM) 8 MG tablet   Oral   Take 8 mg by mouth at bedtime.         . ibandronate (BONIVA) 150 MG tablet   Oral   Take 150 mg by mouth every 30 (thirty) days. Take in the morning with a full glass of water, on an empty stomach, and do not take anything else by mouth or lie down for the next 30 min. (on or about the 25th of each month)         . lisinopril (PRINIVIL,ZESTRIL) 10 MG tablet   Oral   Take 1 tablet (10 mg total) by mouth daily. Stop until follow up with PCP in 1 week         . nitroGLYCERIN (NITROSTAT) 0.4 MG SL tablet   Sublingual   Place 0.4 mg under the tongue every 5 (five) minutes as needed for chest pain.          Marland Kitchen oxyCODONE-acetaminophen (PERCOCET/ROXICET) 5-325 MG per tablet   Oral   Take 1-2 tablets by mouth every 6 (six) hours as needed for severe pain.   30 tablet   0    BP 116/68  Pulse 108  Resp 25  Ht 5' (1.524 m)  Wt 194 lb (87.998 kg)  BMI 37.89 kg/m2  SpO2 98% Physical Exam  Nursing note and vitals reviewed. Constitutional: She is oriented to person, place, and time. She appears well-developed and well-nourished.  Mild distress  HENT:  Head: Normocephalic and atraumatic.  Eyes: EOM are normal. Pupils are equal, round, and reactive to light.  Neck: Normal range of motion. No JVD present.  Cardiovascular: Normal rate and intact distal pulses.   A. fib  Pulmonary/Chest:  Breath sounds normal. She is in respiratory distress. She has no wheezes. She exhibits no tenderness.  Tachypneic, mainly clear to auscultation bilaterally  Abdominal: Soft. She exhibits no distension. There is no tenderness.  Musculoskeletal: Normal range of motion. She exhibits no edema.  Neurological: She is alert and oriented to person, place, and time. No cranial nerve deficit. She exhibits normal muscle tone. Coordination normal.  Skin: Skin is warm and dry.  Psychiatric: She has a normal mood and affect.    ED Course  Procedures (including critical care time) Labs Review Labs Reviewed  CBC WITH DIFFERENTIAL - Abnormal; Notable for the following:    WBC 12.5 (*)    RBC 3.02 (*)    Hemoglobin 8.8 (*)    HCT 27.4 (*)  RDW 19.5 (*)    Neutrophils Relative % 88 (*)    Neutro Abs 11.0 (*)    Lymphocytes Relative 6 (*)    All other components within normal limits  PRO B NATRIURETIC PEPTIDE - Abnormal; Notable for the following:    Pro B Natriuretic peptide (BNP) 31366.0 (*)    All other components within normal limits  BASIC METABOLIC PANEL - Abnormal; Notable for the following:    Sodium 132 (*)    Potassium 6.8 (*)    Glucose, Bld 166 (*)    BUN 43 (*)    Creatinine, Ser 1.78 (*)    Calcium 8.2 (*)    GFR calc non Af Amer 26 (*)    GFR calc Af Amer 31 (*)    All other components within normal limits  PROTIME-INR - Abnormal; Notable for the following:    Prothrombin Time 18.1 (*)    INR 1.54 (*)    All other components within normal limits  URINALYSIS, ROUTINE W REFLEX MICROSCOPIC - Abnormal; Notable for the following:    Color, Urine AMBER (*)    APPearance CLOUDY (*)    Bilirubin Urine SMALL (*)    Protein, ur 100 (*)    All other components within normal limits  URINE MICROSCOPIC-ADD ON - Abnormal; Notable for the following:    Casts HYALINE CASTS (*)    All other components within normal limits  CG4 I-STAT (LACTIC ACID) - Abnormal; Notable for the following:     Lactic Acid, Venous 3.43 (*)    All other components within normal limits  POCT I-STAT 3, BLOOD GAS (G3+) - Abnormal; Notable for the following:    pCO2 arterial 30.9 (*)    Bicarbonate 19.7 (*)    Acid-base deficit 4.0 (*)    All other components within normal limits  POCT I-STAT, CHEM 8 - Abnormal; Notable for the following:    Potassium 5.3 (*)    BUN 43 (*)    Creatinine, Ser 2.10 (*)    Glucose, Bld 173 (*)    Calcium, Ion 1.09 (*)    Hemoglobin 8.5 (*)    HCT 25.0 (*)    All other components within normal limits  CULTURE, BLOOD (ROUTINE X 2)  CULTURE, BLOOD (ROUTINE X 2)  BLOOD GAS, ARTERIAL  POCT I-STAT TROPONIN I   Imaging Review Dg Chest Port 1 View  07/15/2013   CLINICAL DATA:  Respiratory failure and fatigue. CHF. COPD. Diabetes.  EXAM: PORTABLE CHEST - 1 VIEW  COMPARISON:  07/11/2013  FINDINGS: Right glenohumeral joint arthroplasty. Cardiomegaly accentuated by AP portable technique. Advanced aortic atherosclerosis. Possible small pleural effusions. No pneumothorax. Patchy right upper and right lower lobe airspace disease. Minimal left base airspace disease.  IMPRESSION: Markedly worsened aeration, with right greater than left bilateral airspace disease. Favor infection or aspiration. Asymmetric pulmonary edema felt less likely.  Cardiomegaly with atherosclerosis and probable small bilateral pleural effusions.   Electronically Signed   By: Jeronimo Greaves M.D.   On: 07/15/2013 19:39    EKG Interpretation    Date/Time:  Wednesday July 15 2013 19:25:37 EST Ventricular Rate:  58 PR Interval:    QRS Duration: 120 QT Interval:  461 QTC Calculation: 453 R Axis:   67 Text Interpretation:  Junctional rhythm IVCD, consider atypical RBBB Afib from previous EKG earlier in the day resolved  Confirmed by YAO  MD, DAVID [phone]) on 07/15/2013 7:37:18 PM  MDM   1. Healthcare-associated pneumonia   2. SOB (shortness of breath)     On arrival patient with  tachypnea and hypoxic on room air. Concerning this patient for pneumonia versus CHF versus PE or other serious pulmonary cardiac pathology. On exam patient appears to have diminished air movement bilaterally. Worse on bases. Chest x-ray shows bilateral small pleural effusions. BNP markedly elevated. Patient also with x-ray showing possible pneumonia. Patient with white count 12.5. Likely combination CH CHF exacerbation pulmonary edema with pneumonia. On arrival patient was placed on nasal cannula followed by nonrebreather. These failed to alleviate hypoxia and patient remained tachypneic with increased work of breathing. Secondary this patient was placed on BiPAP. Significant outpatient work of breathing. Patient was given Lasix for fluid overload from CHF exacerbation. Decision to withhold nitroglycerin the patient's pressures were TS and did not believe they would support nitroglycerin. Patient is also started on vanc and aztreonam for hospital-acquired pneumonia coverage. Patient also noted to be hyperkalemic on arrival. Patient receiving Lasix. No EKG changes to indicate hyperkalemia. Secondary to patient's multiple medical issues as well as multiple problems consult to medicine for started on admission. Patient remained stable in the emergency department until time of transfer. Stable on BiPAP.   Bridgett Larsson, MD 07/15/13 [phone]

## 2013-07-16 ENCOUNTER — Encounter (HOSPITAL_COMMUNITY): Payer: Self-pay | Admitting: *Deleted

## 2013-07-16 DIAGNOSIS — E875 Hyperkalemia: Secondary | ICD-10-CM | POA: Diagnosis present

## 2013-07-16 DIAGNOSIS — J9621 Acute and chronic respiratory failure with hypoxia: Secondary | ICD-10-CM | POA: Diagnosis present

## 2013-07-16 DIAGNOSIS — N189 Chronic kidney disease, unspecified: Secondary | ICD-10-CM

## 2013-07-16 LAB — BASIC METABOLIC PANEL
BUN: 50 mg/dL — ABNORMAL HIGH (ref 6–23)
BUN: 56 mg/dL — ABNORMAL HIGH (ref 6–23)
CO2: 15 mEq/L — ABNORMAL LOW (ref 19–32)
CO2: 15 mEq/L — ABNORMAL LOW (ref 19–32)
Calcium: 8 mg/dL — ABNORMAL LOW (ref 8.4–10.5)
Calcium: 8 mg/dL — ABNORMAL LOW (ref 8.4–10.5)
Chloride: 98 mEq/L (ref 96–112)
Chloride: 98 mEq/L (ref 96–112)
Creatinine, Ser: 1.83 mg/dL — ABNORMAL HIGH (ref 0.50–1.10)
Creatinine, Ser: 1.86 mg/dL — ABNORMAL HIGH (ref 0.50–1.10)
GFR calc Af Amer: 30 mL/min — ABNORMAL LOW (ref >90)
Glucose, Bld: 224 mg/dL — ABNORMAL HIGH (ref 70–99)
Glucose, Bld: 265 mg/dL — ABNORMAL HIGH (ref 70–99)

## 2013-07-16 LAB — CBC WITH DIFFERENTIAL/PLATELET
Eosinophils Absolute: 0 10*3/uL (ref 0.0–0.7)
Eosinophils Relative: 0 % (ref 0–5)
HCT: 25.1 % — ABNORMAL LOW (ref 36.0–46.0)
Hemoglobin: 8.3 g/dL — ABNORMAL LOW (ref 12.0–15.0)
Lymphocytes Relative: 7 % — ABNORMAL LOW (ref 12–46)
Lymphs Abs: 0.8 10*3/uL (ref 0.7–4.0)
MCV: 88.1 fL (ref 78.0–100.0)
Monocytes Absolute: 0.4 10*3/uL (ref 0.1–1.0)
Neutro Abs: 10.2 10*3/uL — ABNORMAL HIGH (ref 1.7–7.7)
Platelets: 258 10*3/uL (ref 150–400)
RBC: 2.85 MIL/uL — ABNORMAL LOW (ref 3.87–5.11)
WBC: 11.4 10*3/uL — ABNORMAL HIGH (ref 4.0–10.5)

## 2013-07-16 LAB — CULTURE, BLOOD (ROUTINE X 2)

## 2013-07-16 LAB — COMPREHENSIVE METABOLIC PANEL
ALT: 116 U/L — ABNORMAL HIGH (ref 0–35)
Albumin: 2.2 g/dL — ABNORMAL LOW (ref 3.5–5.2)
BUN: 49 mg/dL — ABNORMAL HIGH (ref 6–23)
Calcium: 7.8 mg/dL — ABNORMAL LOW (ref 8.4–10.5)
Creatinine, Ser: 1.8 mg/dL — ABNORMAL HIGH (ref 0.50–1.10)
GFR calc Af Amer: 30 mL/min — ABNORMAL LOW (ref >90)
GFR calc non Af Amer: 26 mL/min — ABNORMAL LOW (ref >90)
Potassium: 5.7 mEq/L — ABNORMAL HIGH (ref 3.5–5.1)
Total Protein: 5.8 g/dL — ABNORMAL LOW (ref 6.0–8.3)

## 2013-07-16 LAB — GLUCOSE, CAPILLARY
Glucose-Capillary: 149 mg/dL — ABNORMAL HIGH (ref 70–99)
Glucose-Capillary: 184 mg/dL — ABNORMAL HIGH (ref 70–99)
Glucose-Capillary: 210 mg/dL — ABNORMAL HIGH (ref 70–99)
Glucose-Capillary: 238 mg/dL — ABNORMAL HIGH (ref 70–99)

## 2013-07-16 LAB — STREP PNEUMONIAE URINARY ANTIGEN: Strep Pneumo Urinary Antigen: NEGATIVE

## 2013-07-16 LAB — HEPATIC FUNCTION PANEL
AST: 184 U/L — ABNORMAL HIGH (ref 0–37)
Albumin: 2.3 g/dL — ABNORMAL LOW (ref 3.5–5.2)
Alkaline Phosphatase: 112 U/L (ref 39–117)
Total Bilirubin: 0.4 mg/dL (ref 0.3–1.2)
Total Protein: 5.7 g/dL — ABNORMAL LOW (ref 6.0–8.3)

## 2013-07-16 LAB — LEGIONELLA ANTIGEN, URINE: Legionella Antigen, Urine: NEGATIVE

## 2013-07-16 MED ORDER — ALBUTEROL SULFATE (5 MG/ML) 0.5% IN NEBU
2.5000 mg | INHALATION_SOLUTION | RESPIRATORY_TRACT | Status: DC | PRN
  Administered 2013-07-17 – 2013-07-19 (×4): 2.5 mg via RESPIRATORY_TRACT
  Filled 2013-07-16 (×4): qty 0.5

## 2013-07-16 MED ORDER — OXYCODONE HCL ER 10 MG PO T12A
20.0000 mg | EXTENDED_RELEASE_TABLET | Freq: Two times a day (BID) | ORAL | Status: DC
  Administered 2013-07-18 – 2013-07-21 (×3): 20 mg via ORAL
  Filled 2013-07-16 (×7): qty 2

## 2013-07-16 MED ORDER — ALBUTEROL SULFATE (5 MG/ML) 0.5% IN NEBU
2.5000 mg | INHALATION_SOLUTION | RESPIRATORY_TRACT | Status: DC
  Administered 2013-07-16 (×4): 2.5 mg via RESPIRATORY_TRACT
  Filled 2013-07-16 (×4): qty 0.5

## 2013-07-16 MED ORDER — INSULIN GLARGINE 100 UNIT/ML ~~LOC~~ SOLN: 20.0000 [IU] | Freq: Every day | SUBCUTANEOUS | Status: DC

## 2013-07-16 MED ORDER — METRONIDAZOLE IN NACL 5-0.79 MG/ML-% IV SOLN
500.0000 mg | Freq: Three times a day (TID) | INTRAVENOUS | Status: AC
  Administered 2013-07-16 – 2013-07-17 (×4): 500 mg via INTRAVENOUS
  Filled 2013-07-16 (×7): qty 100

## 2013-07-16 MED ORDER — OXYBUTYNIN CHLORIDE ER 10 MG PO TB24
10.0000 mg | ORAL_TABLET | Freq: Every day | ORAL | Status: DC
  Administered 2013-07-16 – 2013-07-22 (×7): 10 mg via ORAL
  Filled 2013-07-16 (×7): qty 1

## 2013-07-16 MED ORDER — INSULIN ASPART 100 UNIT/ML ~~LOC~~ SOLN
0.0000 [IU] | Freq: Every day | SUBCUTANEOUS | Status: DC
  Administered 2013-07-16: 2 [IU] via SUBCUTANEOUS
  Administered 2013-07-17 – 2013-07-18 (×2): 3 [IU] via SUBCUTANEOUS

## 2013-07-16 MED ORDER — COLCHICINE 0.6 MG PO TABS
0.6000 mg | ORAL_TABLET | Freq: Two times a day (BID) | ORAL | Status: DC
  Administered 2013-07-16 – 2013-07-17 (×3): 0.6 mg via ORAL
  Filled 2013-07-16 (×4): qty 1

## 2013-07-16 MED ORDER — INSULIN ASPART 100 UNIT/ML ~~LOC~~ SOLN
0.0000 [IU] | Freq: Three times a day (TID) | SUBCUTANEOUS | Status: DC
  Administered 2013-07-16 (×2): 5 [IU] via SUBCUTANEOUS
  Administered 2013-07-16: 3 [IU] via SUBCUTANEOUS
  Administered 2013-07-17: 8 [IU] via SUBCUTANEOUS
  Administered 2013-07-17: 11 [IU] via SUBCUTANEOUS
  Administered 2013-07-17: 4 [IU] via SUBCUTANEOUS
  Administered 2013-07-18: 11 [IU] via SUBCUTANEOUS
  Administered 2013-07-18 – 2013-07-19 (×3): 8 [IU] via SUBCUTANEOUS
  Administered 2013-07-19: 3 [IU] via SUBCUTANEOUS
  Administered 2013-07-20 – 2013-07-21 (×3): 5 [IU] via SUBCUTANEOUS
  Administered 2013-07-21: 2 [IU] via SUBCUTANEOUS
  Administered 2013-07-21 – 2013-07-22 (×3): 3 [IU] via SUBCUTANEOUS

## 2013-07-16 MED ORDER — INSULIN GLARGINE 100 UNIT/ML ~~LOC~~ SOLN
15.0000 [IU] | Freq: Every day | SUBCUTANEOUS | Status: DC
  Administered 2013-07-16 – 2013-07-17 (×2): 15 [IU] via SUBCUTANEOUS
  Filled 2013-07-16 (×4): qty 0.15

## 2013-07-16 MED ORDER — NITROGLYCERIN 0.4 MG SL SUBL: 0.4000 mg | SUBLINGUAL_TABLET | SUBLINGUAL | Status: DC | PRN

## 2013-07-16 MED ORDER — ISOSORBIDE MONONITRATE 15 MG HALF TABLET
15.0000 mg | ORAL_TABLET | Freq: Every day | ORAL | Status: DC
  Administered 2013-07-16 – 2013-07-22 (×7): 15 mg via ORAL
  Filled 2013-07-16 (×7): qty 1

## 2013-07-16 MED ORDER — RAMELTEON 8 MG PO TABS
8.0000 mg | ORAL_TABLET | Freq: Every day | ORAL | Status: DC
  Administered 2013-07-16 – 2013-07-21 (×6): 8 mg via ORAL
  Filled 2013-07-16 (×8): qty 1

## 2013-07-16 MED ORDER — INSULIN GLARGINE 100 UNIT/ML ~~LOC~~ SOLN
10.0000 [IU] | Freq: Every day | SUBCUTANEOUS | Status: DC
  Filled 2013-07-16: qty 0.1

## 2013-07-16 MED ORDER — CYCLOBENZAPRINE HCL 10 MG PO TABS
10.0000 mg | ORAL_TABLET | Freq: Every day | ORAL | Status: DC
  Administered 2013-07-16 – 2013-07-22 (×7): 10 mg via ORAL
  Filled 2013-07-16 (×7): qty 1

## 2013-07-16 MED ORDER — PREDNISONE 20 MG PO TABS
40.0000 mg | ORAL_TABLET | Freq: Every day | ORAL | Status: DC
  Administered 2013-07-16 – 2013-07-18 (×3): 40 mg via ORAL
  Filled 2013-07-16 (×5): qty 2

## 2013-07-16 MED ORDER — CLOPIDOGREL BISULFATE 75 MG PO TABS
75.0000 mg | ORAL_TABLET | Freq: Every day | ORAL | Status: DC
  Administered 2013-07-16 – 2013-07-22 (×7): 75 mg via ORAL
  Filled 2013-07-16 (×7): qty 1

## 2013-07-16 MED ORDER — ALBUTEROL SULFATE (5 MG/ML) 0.5% IN NEBU
2.5000 mg | INHALATION_SOLUTION | RESPIRATORY_TRACT | Status: DC
  Administered 2013-07-16: 2.5 mg via RESPIRATORY_TRACT
  Filled 2013-07-16: qty 0.5

## 2013-07-16 MED ORDER — FUROSEMIDE 10 MG/ML IJ SOLN
80.0000 mg | Freq: Two times a day (BID) | INTRAMUSCULAR | Status: AC
  Administered 2013-07-16 – 2013-07-17 (×2): 80 mg via INTRAVENOUS
  Filled 2013-07-16 (×2): qty 8

## 2013-07-16 MED ORDER — BUDESONIDE 0.25 MG/2ML IN SUSP
0.2500 mg | Freq: Two times a day (BID) | RESPIRATORY_TRACT | Status: DC
  Administered 2013-07-17 – 2013-07-22 (×9): 0.25 mg via RESPIRATORY_TRACT
  Filled 2013-07-16 (×17): qty 2

## 2013-07-16 MED ORDER — DEXTROSE 5 % IV SOLN: 2.0000 g | Freq: Three times a day (TID) | INTRAVENOUS | Status: DC

## 2013-07-16 MED ORDER — IPRATROPIUM BROMIDE 0.02 % IN SOLN
0.5000 mg | Freq: Three times a day (TID) | RESPIRATORY_TRACT | Status: DC
  Administered 2013-07-17 – 2013-07-22 (×17): 0.5 mg via RESPIRATORY_TRACT
  Filled 2013-07-16 (×17): qty 2.5

## 2013-07-16 MED ORDER — HEPARIN SODIUM (PORCINE) 5000 UNIT/ML IJ SOLN
5000.0000 [IU] | Freq: Three times a day (TID) | INTRAMUSCULAR | Status: DC
  Administered 2013-07-16 – 2013-07-22 (×20): 5000 [IU] via SUBCUTANEOUS
  Filled 2013-07-16 (×23): qty 1

## 2013-07-16 MED ORDER — LEVOTHYROXINE SODIUM 50 MCG PO TABS
50.0000 ug | ORAL_TABLET | ORAL | Status: DC
  Administered 2013-07-16 – 2013-07-21 (×4): 50 ug via ORAL
  Filled 2013-07-16 (×7): qty 1

## 2013-07-16 MED ORDER — DILTIAZEM HCL ER COATED BEADS 360 MG PO CP24
360.0000 mg | ORAL_CAPSULE | Freq: Every day | ORAL | Status: DC
  Administered 2013-07-16 – 2013-07-22 (×7): 360 mg via ORAL
  Filled 2013-07-16 (×7): qty 1

## 2013-07-16 MED ORDER — FUROSEMIDE 10 MG/ML IJ SOLN
40.0000 mg | Freq: Two times a day (BID) | INTRAMUSCULAR | Status: DC
  Administered 2013-07-16: 40 mg via INTRAVENOUS
  Filled 2013-07-16: qty 4

## 2013-07-16 MED ORDER — LEVOTHYROXINE SODIUM 25 MCG PO TABS
25.0000 ug | ORAL_TABLET | ORAL | Status: DC
  Administered 2013-07-17 – 2013-07-22 (×3): 25 ug via ORAL
  Filled 2013-07-16 (×3): qty 1

## 2013-07-16 MED ORDER — GLIMEPIRIDE 4 MG PO TABS
4.0000 mg | ORAL_TABLET | Freq: Every day | ORAL | Status: DC
  Administered 2013-07-17 – 2013-07-20 (×4): 4 mg via ORAL
  Filled 2013-07-16 (×6): qty 1

## 2013-07-16 MED ORDER — LORAZEPAM 0.5 MG PO TABS
0.2500 mg | ORAL_TABLET | Freq: Four times a day (QID) | ORAL | Status: DC | PRN
  Administered 2013-07-17 – 2013-07-22 (×7): 0.25 mg via ORAL
  Filled 2013-07-16 (×8): qty 1

## 2013-07-16 MED ORDER — ALBUTEROL SULFATE (5 MG/ML) 0.5% IN NEBU
2.5000 mg | INHALATION_SOLUTION | Freq: Three times a day (TID) | RESPIRATORY_TRACT | Status: DC
  Administered 2013-07-17 – 2013-07-20 (×11): 2.5 mg via RESPIRATORY_TRACT
  Filled 2013-07-16 (×11): qty 0.5

## 2013-07-16 MED ORDER — IPRATROPIUM BROMIDE 0.02 % IN SOLN
0.5000 mg | RESPIRATORY_TRACT | Status: DC
  Administered 2013-07-16 (×5): 0.5 mg via RESPIRATORY_TRACT
  Filled 2013-07-16 (×5): qty 2.5

## 2013-07-16 MED ORDER — OXYCODONE-ACETAMINOPHEN 5-325 MG PO TABS
1.0000 | ORAL_TABLET | Freq: Four times a day (QID) | ORAL | Status: DC | PRN
  Administered 2013-07-19: 1 via ORAL
  Filled 2013-07-16: qty 1

## 2013-07-16 NOTE — Evaluation (Signed)
Clinical/Bedside Swallow Evaluation Patient Details  Name: [name] MRN: [id number] Date of Birth: 1934/08/09  Today's Date: 07/16/2013 Time: 4098-1191 SLP Time Calculation (min): 39 min  Past Medical History:  Past Medical History  Diagnosis Date  . CAD (coronary artery disease)   . CHF (congestive heart failure)   . Paroxysmal atrial fibrillation   . Bradycardia   . Hypertension   . Renal artery stenosis   . COPD (chronic obstructive pulmonary disease)   . Obesity hypoventilation syndrome   . Pruritus   . Tracheobronchitis   . Acute rhinosinusitis   . Anemia   . Hypoxemia   . Peripheral edema   . Obesity, endogenous   . Diabetes mellitus   . Acid reflux disease   . Osteoarthritis, generalized   . Hypothyroidism   . Lung nodule     lingula  . OSA (obstructive sleep apnea)     NPSG 04/03/00--AHI 28.hr  . DVT (deep venous thrombosis) 2003    left leg  . Vaginal atrophy   . Hx of colonic polyps   . Acute diastolic heart failure   . Secondary pulmonary hypertension 06/09/2013   Past Surgical History:  Past Surgical History  Procedure Laterality Date  . Lumbar spine surgery    . Cholecystectomy    . Hemorroidectomy    . Repair wound fistula    . Right shoulder    . Elbow surgery    . Dilation and curettage of uterus    . Breast surgery  1998    lumpectomy  . Oophorectomy  1959    BSO-? ovarian cancer   HPI:  79 year old female admitted 07/15/13 due to SOB, cough and weakness.  Pt had been DC'd on 12/3 (following stay for PNA, N/V) but was readmitted same day.  PMH significant for CAD, CHF, AFib, HTN, OSA, COPD, DM, GERD, recurrent PNA.  BSE requested to evaluate swallow function and safety.   Assessment / Plan / Recommendation Clinical Impression  Pt completed oral care with suction after set up.  Oral motor strength and function are adequate.  No overt s/s aspiration observed or reported with any consistency.  Dx COPD increases risk of silent  aspiration, so MBS is recommended to determine if dysphagia is contributing to recurrent PNA.  Until MBS can be completed, soft diet with chopped meats, thin liquids is recommended.      Aspiration Risk  Moderate    Diet Recommendation Dysphagia 3 (Mechanical Soft);Thin liquid (chop meats)   Liquid Administration via: Straw;Cup Medication Administration: Whole meds with liquid Supervision: Patient able to self feed Compensations: Slow rate;Small sips/bites;Follow solids with liquid Postural Changes and/or Swallow Maneuvers: Seated upright 90 degrees;Upright 30-60 min after meal    Other  Recommendations Recommended Consults: MBS Oral Care Recommendations: Oral care before and after PO Other Recommendations: Clarify dietary restrictions;Have oral suction available   Follow Up Recommendations  Inpatient Rehab    Frequency and Duration min 1 x/week  1 week   Pertinent Vitals/Pain VSS, no pain reported    SLP Swallow Goals  see care plan   Swallow Study Prior Functional Status   Tolerating regular diet prior to admit.    General Date of Onset: 07/15/13 HPI: 79 year old female admitted 07/15/13 due to SOB, cough and weakness.  Pt had been DC'd on 12/3 (following stay for PNA, N/V) but was readmitted same day.  PMH significant for CAD, CHF, AFib, HTN, OSA, COPD, DM, GERD, recurrent PNA.  BSE requested  to evaluate swallow function and safety. Type of Study: Bedside swallow evaluation Previous Swallow Assessment: none found Diet Prior to this Study: NPO Temperature Spikes Noted: No Respiratory Status: Nasal cannula History of Recent Intubation: No Behavior/Cognition: Alert;Cooperative;Pleasant mood Oral Cavity - Dentition: Adequate natural dentition Self-Feeding Abilities: Able to feed self Patient Positioning: Upright in bed Baseline Vocal Quality: Clear Volitional Cough: Strong Volitional Swallow: Able to elicit    Oral/Motor/Sensory Function Overall Oral Motor/Sensory  Function: Appears within functional limits for tasks assessed   Ice Chips Ice chips: Not tested   Thin Liquid Thin Liquid: Within functional limits Presentation: Straw    Nectar Thick Nectar Thick Liquid: Not tested   Honey Thick Honey Thick Liquid: Not tested   Puree Puree: Within functional limits Presentation: Self Fed;Spoon   Solid   GO   Celia B. Murvin Natal [facility], CCC-SLP [phone] Solid: Within functional limits Presentation: Self Fed       Murvin Natal, Ruffin Pyo 07/16/2013,10:07 AM

## 2013-07-16 NOTE — Progress Notes (Signed)
Rehab Admissions Coordinator Note:  Patient was screened by Clois Dupes for appropriateness for an Inpatient Acute Rehab Consult. Patient recently discharged from SNF as well as hospital and readmitted in 24 hrs. At this time, we are recommending Skilled Nursing Facility.  Clois Dupes 07/16/2013, 12:12 PM  I can be reached at [phone].

## 2013-07-16 NOTE — Progress Notes (Signed)
Comment:  Was notified by RN that pt noted to have order for transfer to telemetry but was sent to SDU. There is no order for SDU bed. RN inquiring as to whether pt needs telemetry or SDU bed. After some discussion RN reported that pt is still on PRN BiPAP and apparently had to use last night. MD note does reflect plan for transfer to telemetry but does not address the continued need for PRN BiPAP. Pt can not receive PRN BiPAP on telemetry floor. Will leave pt in SDU overnight to assess continued need for PRN BiPAP and then have rounding MD in am reassess for transfer to telemetry unit.  Leanne Chang, NP-C [facility] Pager [phone]

## 2013-07-16 NOTE — ED Provider Notes (Signed)
I saw and evaluated the patient, reviewed the resident's note and I agree with the findings and plan.  EKG Interpretation    Date/Time:  Wednesday July 15 2013 19:25:37 EST Ventricular Rate:  58 PR Interval:    QRS Duration: 120 QT Interval:  461 QTC Calculation: 453 R Axis:   67 Text Interpretation:  Junctional rhythm IVCD, consider atypical RBBB Afib from previous EKG earlier in the day resolved  Confirmed by Kevaughn Ewing  MD, Dwayne Bulkley [phone]) on 07/15/2013 7:37:18 PM            [name] is a 79 y.o. female hx of CAD, CHF, COPD here with SOB, fatigue. Was recently admitted and discharged in AM for afib, pneumonia, CHF. Patient went home and became more short of breath. On initial exam, she appeared tachypneic and is moderate respiratory distress. She has diminished breath sounds but no wheezing. I am concerned for possible recurrent pneumonia vs CHF vs COPD exacerbation. Her BP is in the low 100s and cxr showed pneumonia vs CHF. She was given lasix for diuresis and continuous nebs. She was also placed on bipap with reassuring ABG. She was also given vanc/aztreonam for HCAP coverage. Patient was admitted for possible aspiration pneumonia and CHF exacerbation and hyperkalemia (which was treated in the ED).    Richardean Canal, MD 07/16/13 [phone]

## 2013-07-16 NOTE — Progress Notes (Signed)
       Patient Name: [name] Date of Encounter: 07/16/2013    SUBJECTIVE: :Discharged yesterday and returned hours later with dyspnea, falling and persistent atrial arrhythmia, ? Atrial flutter. She denies chest pain.   TELEMETRY:  ? Sinus tach vs atrial tach with 2:1 conduction Filed Vitals:   07/16/13 1100 07/16/13 1200 07/16/13 1300 07/16/13 1400  BP:  123/50 114/59 111/52  Pulse:  101 100 103  TempSrc: Oral Oral Oral Oral  Resp: 20 25 28  37  Height:      Weight:      SpO2: 100% 98% 98% 98%    Intake/Output Summary (Last 24 hours) at 07/16/13 1453 Last data filed at 07/16/13 1350  Gross per 24 hour  Intake    600 ml  Output    125 ml  Net    475 ml    LABS: Basic Metabolic Panel:  Recent Labs  28/41/32 0335 07/16/13 1145  NA 133* 134*  K 5.7* 5.4*  CL 98 98  CO2 21 15*  GLUCOSE 174* 224*  BUN 49* 50*  CREATININE 1.80* 1.83*  CALCIUM 7.8* 8.0*   CBC:  Recent Labs  07/15/13 1909 07/15/13 2301 07/16/13 0335  WBC 12.5*  --  11.4*  NEUTROABS 11.0*  --  10.2*  HGB 8.8* 8.5* 8.3*  HCT 27.4* 25.0* 25.1*  MCV 90.7  --  88.1  PLT 268  --  258   Cardiac Enzymes:  Recent Labs  07/16/13 0200  TROPONINI <0.30   BNP (last 3 results)  Recent Labs  06/07/13 1300 06/13/13 0406 07/15/13 2101  PROBNP 3554.0* 1503.0* 31366.0*   Radiology/Studies:  CXR 07/15/13 IMPRESSION:  Markedly worsened aeration, with right greater than left bilateral  airspace disease. Favor infection or aspiration. Asymmetric  pulmonary edema felt less likely.  Cardiomegaly with atherosclerosis and probable small bilateral  pleural effusions.  Electronically Signed  By: Jeronimo Greaves M.D.  On: 07/15/2013 19:39   ECHOCARDIOGRAM: 06/08/13 Study Conclusions  - Left ventricle: Wall thickness was increased in a pattern of mild LVH. Systolic function was vigorous. The estimated ejection fraction was in the range of 65% to 70%. Features are consistent with a  pseudonormal left ventricular filling pattern, with concomitant abnormal relaxation and increased filling pressure (grade 2 diastolic dysfunction). Doppler parameters are consistent with high ventricular filling pressure. - Aortic valve: Mild regurgitation. - Mitral valve: Severely calcified annulus. Mild to moderate regurgitation. - Left atrium: The atrium was mildly dilated. - Tricuspid valve: Moderate regurgitation. - Pulmonary arteries: Systolic pressure was moderately   Physical Exam: Blood pressure 111/52, pulse 103, resp. rate 37, height 5' (1.524 m), weight 195 lb 5.2 oz (88.6 kg), SpO2 98.00%. Weight change:    Chest is clear anteriorly Cardiac exam with regular rhythm fixed at 107 bpm. No edema.  ASSESSMENT:  1. Acute on chronic diastolic heart failure made much worse by persistent atrial fibrillation/flutter(apparent today). I doubt pneumonia. 2. CKD stage 3  3. COPD 4. Uncontrolled atrial arrhythmia with decreased cardiac performance (loss of atrial kick)    Plan: 1. Aggressive diuresis 2. EP consult to see if we have any options for atrial arrhythmia control. 3. ECG to assess rhythm  Signed, Lesleigh Noe 07/16/2013, 2:53 PM

## 2013-07-16 NOTE — Progress Notes (Signed)
INITIAL NUTRITION ASSESSMENT  DOCUMENTATION CODES Per approved criteria  -Obesity Unspecified -Severe malnutrition in the context of chronic illness   INTERVENTION:  Diet advancement per SLP pending results of swallow evaluation tomorrow.  Recommend Ensure Complete PO BID to maximize oral intake once diet advanced.  NUTRITION DIAGNOSIS: Malnutrition related to poor appetite with poor oral intake as evidenced by 6% weight loss in one month associated with intake </= 75% of estimated energy requirement for >/= 1 month.   Goal: Intake to meet >90% of estimated nutrition needs.  Monitor:  PO intake, labs, weight trend.  Reason for Assessment: MST=4  79 y.o. female  Admitting Dx: Shortness of breath and cough  ASSESSMENT: Patient is a 79 y.o. female with past medical history of CAD, CHF, atrial fibrillation, hypertension, COPD, sleep apnea on CPAP, and diabetes. She was discharged from the hospital 12/3 after her admission for nausea and vomiting which was thought to be secondary to amiodarone and it was stopped. After the discharge the patient went home and couldn't get out of the car due to severe weakness and shortness of breath. Since the family couldn't help the patient, patient was laid down on the ground and EMS was called. Patient was re-admitted on 12/3.   Patient reports that she lost from 189 lb to 178 lb in 3 weeks while in rehab at [facility] ~ 1 month ago. She has since gained weight due to fluids. She reports a very poor appetite and poor intake for the past month. Nutrition focused physical exam completed.  No muscle or subcutaneous fat depletion noticed. Suspect some muscle depletion, but unable to see it due to obesity.  Pt meets criteria for severe MALNUTRITION in the context of chronic illness as evidenced by 6% weight loss in one month associated with intake </= 75% of estimated energy requirement for >/= 1 month.  Height: Ht Readings from Last 1 Encounters:   07/16/13 5' (1.524 m)    Weight: Wt Readings from Last 1 Encounters:  07/16/13 195 lb 5.2 oz (88.6 kg)    Ideal Body Weight: 45.5 kg  % Ideal Body Weight: 195%  Wt Readings from Last 10 Encounters:  07/16/13 195 lb 5.2 oz (88.6 kg)  07/15/13 194 lb 10.7 oz (88.3 kg)  06/16/13 178 lb 9.2 oz (81 kg)  06/04/13 189 lb (85.73 kg)  04/23/13 192 lb 12.8 oz (87.454 kg)  02/27/13 193 lb (87.544 kg)  11/27/12 188 lb (85.276 kg)  09/08/12 193 lb 6.4 oz (87.726 kg)  01/11/12 196 lb 3.2 oz (88.996 kg)  07/13/11 205 lb (92.987 kg)    Usual Body Weight: 189 lb  % Usual Body Weight: 103%  BMI:  Body mass index is 38.15 kg/(m^2). obesity, class 2  Estimated Nutritional Needs: Kcal: 1600-1800 Protein: 80-100 gm Fluid: 1.6 L  Skin: no wounds  Diet Order: NPO  EDUCATION NEEDS: -Education not appropriate at this time   Intake/Output Summary (Last 24 hours) at 07/16/13 1334 Last data filed at 07/16/13 1100  Gross per 24 hour  Intake    550 ml  Output    125 ml  Net    425 ml    Last BM: 12/2   Labs:   Recent Labs Lab 07/11/13 0504 07/11/13 1550 07/12/13 0410 07/13/13 0606  07/15/13 2101 07/15/13 2301 07/16/13 0335 07/16/13 1145  NA 133*  --  137 138  < > 132* 135 133* 134*  K 3.0* 4.0 3.9 4.3  < > 6.8* 5.3* 5.7*  5.4*  CL 93*  --  100 102  < > 98 102 98 98  CO2 27  --  28 26  < > 20  --  21 15*  BUN 95*  --  76* 50*  < > 43* 43* 49* 50*  CREATININE 4.22*  --  2.56* 1.53*  < > 1.78* 2.10* 1.80* 1.83*  CALCIUM 6.0*  --  7.2* 7.8*  < > 8.2*  --  7.8* 8.0*  MG 1.2* 1.7 1.7  --   --   --   --   --   --   PHOS  --   --  4.2 3.1  --   --   --   --   --   GLUCOSE 100*  --  125* 145*  < > 166* 173* 174* 224*  < > = values in this interval not displayed.  CBG (last 3)   Recent Labs  07/15/13 1108 07/16/13 0205 07/16/13 0738  GLUCAP 136* 149* 184*    Scheduled Meds: . ipratropium  0.5 mg Nebulization Q4H   And  . albuterol  2.5 mg Nebulization Q4H  .  aztreonam  1 g Intravenous Q8H  . budesonide  0.25 mg Nebulization BID  . clopidogrel  75 mg Oral Daily  . colchicine  0.6 mg Oral BID  . cyclobenzaprine  10 mg Oral Daily  . heparin  5,000 Units Subcutaneous Q8H  . insulin aspart  0-15 Units Subcutaneous TID WC  . insulin aspart  0-5 Units Subcutaneous QHS  . insulin glargine  10 Units Subcutaneous QHS  . isosorbide mononitrate  15 mg Oral Daily  . [START ON 07/17/2013] levothyroxine  25 mcg Oral Custom  . levothyroxine  50 mcg Oral Custom  . metronidazole  500 mg Intravenous Q8H  . oxybutynin  10 mg Oral Daily  . OxyCODONE  20 mg Oral Q12H  . predniSONE  40 mg Oral Q breakfast  . vancomycin  1,250 mg Intravenous Q24H    Continuous Infusions:   Past Medical History  Diagnosis Date  . CAD (coronary artery disease)   . CHF (congestive heart failure)   . Paroxysmal atrial fibrillation   . Bradycardia   . Hypertension   . Renal artery stenosis   . COPD (chronic obstructive pulmonary disease)   . Obesity hypoventilation syndrome   . Pruritus   . Tracheobronchitis   . Acute rhinosinusitis   . Anemia   . Hypoxemia   . Peripheral edema   . Obesity, endogenous   . Diabetes mellitus   . Acid reflux disease   . Osteoarthritis, generalized   . Hypothyroidism   . Lung nodule     lingula  . OSA (obstructive sleep apnea)     NPSG 04/03/00--AHI 28.hr  . DVT (deep venous thrombosis) 2003    left leg  . Vaginal atrophy   . Hx of colonic polyps   . Acute diastolic heart failure   . Secondary pulmonary hypertension 06/09/2013    Past Surgical History  Procedure Laterality Date  . Lumbar spine surgery    . Cholecystectomy    . Hemorroidectomy    . Repair wound fistula    . Right shoulder    . Elbow surgery    . Dilation and curettage of uterus    . Breast surgery  1998    lumpectomy  . Oophorectomy  1959    BSO-? ovarian cancer    Joaquin Courts, RD, LDN, CNSC Pager [phone] After  Hours Pager [phone]

## 2013-07-16 NOTE — Evaluation (Signed)
Physical Therapy Evaluation Patient Details Name: [name] MRN: [id number] DOB: May 14, 1934 Today's Date: 07/16/2013 Time: 4010-2725 PT Time Calculation (min): 16 min  PT Assessment / Plan / Recommendation History of Present Illness  Pt readmitted to hospital after dc home earlier in the day.  Unable to get out of car at home and went down to ground.  Returned to hospital via EMS. Pt found to have HCAP.  Clinical Impression  Pt admitted with above. Pt currently with functional limitations due to the deficits listed below (see PT Problem List).  Pt will benefit from skilled PT to increase their independence and safety with mobility to allow discharge to the venue listed below. Feel pt could benefit from CIR stay to regain strength and independence to have more successful return home.  Currently pt refusing any type of rehab and wants to go home when she is able to walk in the halls.      PT Assessment  Patient needs continued PT services    Follow Up Recommendations  CIR    Does the patient have the potential to tolerate intense rehabilitation      Barriers to Discharge        Equipment Recommendations  None recommended by PT    Recommendations for Other Services     Frequency Min 3X/week    Precautions / Restrictions Precautions Precautions: Fall Restrictions Weight Bearing Restrictions: No   Pertinent Vitals/Pain VSS      Mobility  Bed Mobility Supine to Sit: 1: +2 Total assist Supine to Sit: Patient Percentage: 60% Sitting - Scoot to Edge of Bed: 1: +2 Total assist Sitting - Scoot to Edge of Bed: Patient Percentage: 60% Details for Bed Mobility Assistance: Assist to bring legs off bed and trunk up. Transfers Sit to Stand: 1: +2 Total assist;From bed Sit to Stand: Patient Percentage: 70% Stand to Sit: 1: +2 Total assist;To bed Stand to Sit: Patient Percentage: 70% Details for Transfer Assistance: Verbal cues for hand placement and assist to bring hips  up. Ambulation/Gait Ambulation/Gait Assistance: 1: +2 Total assist Ambulation/Gait: Patient Percentage: 70% Ambulation Distance (Feet): 3 Feet Assistive device: Rolling walker Ambulation/Gait Assistance Details: Steps to recliner. Verbal cues to stand more erect. Gait Pattern: Step-through pattern;Decreased step length - left;Decreased step length - right;Shuffle;Trunk flexed    Exercises     PT Diagnosis: Difficulty walking;Generalized weakness  PT Problem List: Decreased strength;Decreased activity tolerance;Decreased balance;Decreased mobility;Decreased safety awareness PT Treatment Interventions: DME instruction;Gait training;Functional mobility training;Therapeutic activities;Therapeutic exercise;Balance training;Patient/family education     PT Goals(Current goals can be found in the care plan section) Acute Rehab PT Goals Patient Stated Goal: go home PT Goal Formulation: With patient Time For Goal Achievement: 07/23/13 Potential to Achieve Goals: Good  Visit Information  Last PT Received On: 07/16/13 Assistance Needed: +1 History of Present Illness: Pt readmitted to hospital after dc home earlier in the day.  Unable to get out of car at home and went down to ground.  Returned to hospital via EMS. Pt found to have HCAP.       Prior Functioning  Home Living Family/patient expects to be discharged to:: Private residence Living Arrangements: Spouse/significant other;Other relatives Available Help at Discharge: Family;Available 24 hours/day Type of Home: House Home Access: Other (comment) (stair lift) Home Layout: Multi-level Alternate Level Stairs-Number of Steps: chair lift to second floor bedroom. family stays on 3rd level. Home Equipment: Walker - 4 wheels;Walker - standard;Tub bench;Cane - single point;Wheelchair - manual;Other (comment) Additional Comments: pt  uses RW at home Prior Function Level of Independence: Independent with assistive device(s) Comments: pt  reports ind. with ambulation with AD but requires husband to donn/doff socks due to R hip pain (sciatica).  Communication Communication: No difficulties Dominant Hand: Right    Cognition  Cognition Arousal/Alertness: Awake/alert Behavior During Therapy: WFL for tasks assessed/performed Overall Cognitive Status: Impaired/Different from baseline Area of Impairment: Safety/judgement Safety/Judgement: Decreased awareness of deficits    Extremity/Trunk Assessment Upper Extremity Assessment Upper Extremity Assessment: Defer to OT evaluation Lower Extremity Assessment Lower Extremity Assessment: Generalized weakness   Balance    End of Session PT - End of Session Equipment Utilized During Treatment: Gait belt;Oxygen Activity Tolerance: Patient limited by fatigue Patient left: in chair;with call bell/phone within reach Nurse Communication: Mobility status  GP     [facility] 07/16/2013, 11:48 AM  Skip Mayer PT [phone]

## 2013-07-16 NOTE — Care Management Note (Signed)
    Page 1 of 1   07/16/2013     1:48:23 PM   CARE MANAGEMENT NOTE 07/16/2013  Patient:  [name], [name]   Account Number:  [id number]  Date Initiated:  07/16/2013  Documentation initiated by:  [facility]  Subjective/Objective Assessment:   Readmitted same day as discharged.  SOB - requiring bipap.     Action/Plan:   Anticipated DC Date:  07/20/2013   Anticipated DC Plan:  SKILLED NURSING FACILITY  In-house referral  Clinical Social Worker      DC Planning Services  CM consult      Choice offered to / List presented to:             Status of service:  In process, will continue to follow Medicare Important Message given?   (If response is "NO", the following Medicare IM given date fields will be blank) Date Medicare IM given:   Date Additional Medicare IM given:    Discharge Disposition:    Per UR Regulation:  Reviewed for med. necessity/level of care/duration of stay  If discussed at Long Length of Stay Meetings, dates discussed:    Comments:  ContactsDiedre, [place]an [phone]                   [facility] Daughter [phone]  07-16-13 1:35pm Avie Arenas, RNBSN [phone] pateint was set up with [facility] - PT/OT and RN on last discharge - had not seen her yet.  PT recommending CIR - CIR recommending SNF.  SW consult placed.  Talked with patient, husband and sister in law in room.  All live together.  State patient had actually got to house but could not get out of W/C as legs were just to weak. Agreeable outside rehab for ST for strengthening prior to discharge this time if needed.  Patient feels that if she is just able to work with PT while here she will be able to be stronger to go home.  CM will continue to follow.

## 2013-07-16 NOTE — Progress Notes (Signed)
RT states that she is not short of breath at this time. She did state that she wears a CPAP at bedtime at home. RT asked pt if she wanted to wear tonight and she stated that she did not. RT will check back with patient. BIPAP is at bedside.

## 2013-07-16 NOTE — Evaluation (Signed)
Occupational Therapy Evaluation Patient Details Name: [name] MRN: [id number] DOB: 03-21-1934 Today's Date: 07/16/2013 Time: 5284-1324 OT Time Calculation (min): 17 min  OT Assessment / Plan / Recommendation History of present illness Pt readmitted to hospital after dc home earlier in the day.  Unable to get out of car at home and went down to ground.  Returned to hospital via EMS. Pt found to have HCAP.   Clinical Impression   Pt admitted with above.  Pt presents to OT with the below listed deficits and will benefit from OT to maximize safety and independence with BADLs.  Currently, pt requires min-max A for BADLs.  Pt is eager to return home, but feel she will require rehab prior to discharge home.  She is very resistant to this idea, but may benefit from CIR    OT Assessment  Patient needs continued OT Services    Follow Up Recommendations  CIR;Supervision/Assistance - 24 hour    Barriers to Discharge      Equipment Recommendations  None recommended by OT    Recommendations for Other Services    Frequency  Min 2X/week    Precautions / Restrictions Precautions Precautions: Fall Restrictions Weight Bearing Restrictions: No   Pertinent Vitals/Pain     ADL  Eating/Feeding: Independent Where Assessed - Eating/Feeding: Chair Grooming: Wash/dry hands;Wash/dry face;Brushing hair;Set up Where Assessed - Grooming: Supported sitting Upper Body Bathing: Moderate assistance Where Assessed - Upper Body Bathing: Unsupported sitting;Supported sitting Lower Body Bathing: Maximal assistance Where Assessed - Lower Body Bathing: Supported sit to stand Upper Body Dressing: Moderate assistance Where Assessed - Upper Body Dressing: Unsupported sitting Lower Body Dressing: +1 Total assistance Where Assessed - Lower Body Dressing: Supported sit to Pharmacist, hospital: Moderate assistance Toilet Transfer Method: Sit to stand;Stand pivot Toilet Transfer Equipment: Bedside  commode Toileting - Clothing Manipulation and Hygiene: +1 Total assistance Where Assessed - Toileting Clothing Manipulation and Hygiene: Standing Equipment Used: Rolling walker Transfers/Ambulation Related to ADLs: mod A sit to stand.  Pt very fearful ADL Comments: Pt limted by fatigue and generalized weakness    OT Diagnosis: Generalized weakness  OT Problem List: Decreased strength;Decreased activity tolerance;Impaired balance (sitting and/or standing);Decreased safety awareness;Decreased knowledge of use of DME or AE OT Treatment Interventions: Self-care/ADL training;DME and/or AE instruction;Therapeutic activities;Patient/family education;Balance training   OT Goals(Current goals can be found in the care plan section) Acute Rehab OT Goals Patient Stated Goal: go home OT Goal Formulation: With patient Time For Goal Achievement: 07/30/13 Potential to Achieve Goals: Good ADL Goals Pt Will Perform Grooming: with min assist;standing Pt Will Perform Upper Body Bathing: with set-up;sitting Pt Will Perform Lower Body Bathing: with min assist;sit to/from stand Pt Will Perform Upper Body Dressing: with supervision;sitting Pt Will Perform Lower Body Dressing: with min assist;sit to/from stand Pt Will Transfer to Toilet: with min assist;ambulating;bedside commode;grab bars Pt Will Perform Toileting - Clothing Manipulation and hygiene: with min assist;sit to/from stand  Visit Information  Last OT Received On: 07/16/13 Assistance Needed: +1 History of Present Illness: Pt readmitted to hospital after dc home earlier in the day.  Unable to get out of car at home and went down to ground.  Returned to hospital via EMS. Pt found to have HCAP.       Prior Functioning     Home Living Family/patient expects to be discharged to:: Private residence Living Arrangements: Spouse/significant other;Other relatives Available Help at Discharge: Family;Available 24 hours/day Type of Home: House Home  Access: Other (comment) (stair  lift) Home Layout: Multi-level Alternate Level Stairs-Number of Steps: chair lift to second floor bedroom. family stays on 3rd level. Home Equipment: Walker - 4 wheels;Walker - standard;Tub bench;Cane - single point;Wheelchair - Careers adviser (comment) Additional Comments: pt uses RW at home Prior Function Level of Independence: Independent with assistive device(s) Comments: pt reports ind. with ambulation with AD but requires husband to donn/doff socks due to R hip pain (sciatica).  Communication Communication: No difficulties Dominant Hand: Right         Vision/Perception Vision - History Baseline Vision: Wears glasses only for reading Patient Visual Report: No change from baseline Vision - Assessment Eye Alignment: Within Functional Limits   Cognition  Cognition Arousal/Alertness: Awake/alert Behavior During Therapy: WFL for tasks assessed/performed Overall Cognitive Status: Impaired/Different from baseline Area of Impairment: Safety/judgement Safety/Judgement: Decreased awareness of deficits General Comments: Pt demonstrates decreased understanding of deficits and need for continued rehab at discharge    Extremity/Trunk Assessment Upper Extremity Assessment Upper Extremity Assessment: Generalized weakness Lower Extremity Assessment Lower Extremity Assessment: Defer to PT evaluation Cervical / Trunk Assessment Cervical / Trunk Assessment: Normal     Mobility Bed Mobility Bed Mobility: Not assessed Supine to Sit: 1: +2 Total assist Supine to Sit: Patient Percentage: 60% Sitting - Scoot to Edge of Bed: 1: +2 Total assist Sitting - Scoot to Edge of Bed: Patient Percentage: 60% Details for Bed Mobility Assistance: Assist to bring legs off bed and trunk up. Transfers Transfers: Sit to Stand;Stand to Sit Sit to Stand: 3: Mod assist;With upper extremity assist;From chair/3-in-1 Sit to Stand: Patient Percentage: 70% Stand to Sit: 3: Mod  assist;With upper extremity assist;To chair/3-in-1 Stand to Sit: Patient Percentage: 70% Details for Transfer Assistance: Verbal cues for hand placement.  Assist to rise.  Pt is very fearful and fatigues quickly      Exercise     Balance Balance Balance Assessed: Yes Static Standing Balance Static Standing - Balance Support: Bilateral upper extremity supported Static Standing - Level of Assistance: 3: Mod assist   End of Session OT - End of Session Equipment Utilized During Treatment: Rolling walker Activity Tolerance: Patient limited by fatigue Patient left: in chair;with call bell/phone within reach;with family/visitor present Nurse Communication: Mobility status  GO     Abrish Erny M 07/16/2013, 1:29 PM

## 2013-07-16 NOTE — Progress Notes (Signed)
Pt transported from ED to 2M02 with no complications.

## 2013-07-16 NOTE — ED Notes (Signed)
CBG-149. Notified RN 

## 2013-07-16 NOTE — Progress Notes (Signed)
[facility] Progress Note  TEAM 1 - Stepdown ICU Team   [name] WUJ:[id number] DOB: Dec 17, 1933 DOA: 07/15/2013 PCP: Gweneth Dimitri, MD  Brief narrative: 79 year old female patient with oxygen-dependent COPD, atrial fib, CHF and diabetes. This just discharged home from the hospital on same date as presentation after being hospitalized for apparent nausea due to amiodarone. During the hospitalization patient had an acute kidney injury that responded to IV hydration and short-term discontinuation of her Lasix lisinopril and colchicine. She returned home via private vehicle and was unable to extricate herself from her car because of complaints of severe weakness and shortness of breath. Eventually she was able to get out of the car but was so weak she stat on the ground. The family was unable to get the patient. EMS was called.  Upon arrival to the ER patient was found to be tachypneic and hypoxic on room air. On exam she had diminished air movement bilaterally. Chest x-ray demonstrated pneumonia with also superimposed mild pulmonary edema. Her symptoms did not improve on nasal cannula oxygen nor with nonrebreather mask and due to increased tachypnea and work of breathing she was placed on BiPAP. She was given a dose of Lasix in the ER but then her blood pressure soft and associated was unable to be given any nitroglycerin to help with concerns of CHF exacerbation. She was empirically started on vancomycin and aztreonam for hospital-acquired pneumonia. She was also found to be hyperkalemic without any EKG changes to support symptomatic hyperkalemia.  I have been unable to locate a discharge summary in the system. Last progress note from internal medicine on 12 3 stated patient was to transfer to a telemetry bed  Assessment/Plan: Active Problems: Acute and chronic respiratory failure with hypoxia   A) Acute on chronic grade 2 diastolic heart failure   B) HCAP  (healthcare-associated pneumonia) -suspect only mild exac CHF from progressive hypoxia due to PNA -stop Lasix for now and follow -cont supportive care -has weaned down to 3L (home is 2L) -cont empiric anbx's and add Flagyl due to concerns of ? aspiration etiology -SLP eval- MBSS pending    COPD with ? acute exacerbation -not wheezing so doubt exacerbation therefore will taper steroids    Hyperkalemia -less than admit but still persistant -does not appear was on exogenous KCL at home -K improving- recheck in AM    HYPOTHYROIDISM -Synthroid -Thyroid labs obtained on the 11th 30 refill a TSH of 2.9 and 7 and a free T4 of 1.35   Atrial fibrillation -Per previous cardiology notes: atrial fibrillation with RVR contributes to diastolic heart failure exacerbation -Deemed not a candidate for TEE cardioversion and/or ablation unless she takes anticoagulation and per recent discussion with Dr. Eldridge Dace on 07/15/2013 patient did not want to begin Coumadin -Recommendations on 12/03 from cardiology were to consult EP -Continue Plavix -Will notify cardiology patient has returned to the hospital    DM -CBG stable -cont SSI and Lantus since beginning diet -?? Also on Amaryl pre admit   Deconditioning -PT/OT eval- may need CIR vs SNF prior to going back home   DVT prophylaxis: Subcutaneous heparin Code Status: Full Family Communication: Patient only no family at bedside during rounds Disposition Plan/Expected LOS: Transfer to telemetry since remains hyperkalemic. Pending PT and OT evaluations given severe weakness at presentation may benefit from either skilled nursing facility or rehabilitation stay before returning to home Isolation: None Nutritional Status:  protein calorie malnutrition related to acute pulmonary illness  Consultants: Cardiology  Procedures:  Modified barium swallow study pending  Antibiotics: Azactam 12/03 >>> Flagyl 12/04 >>> Vancomycin 12/03  >>>  HPI/Subjective: General alert and endorses breathing much better than prior to admission. Still endorses significant generalized weakness and states "I am unable to get out of the bed and stand without falling"  Objective: Blood pressure 123/50, pulse 101, resp. rate 25, height 5' (1.524 m), weight 195 lb 5.2 oz (88.6 kg), SpO2 98.00%.  Intake/Output Summary (Last 24 hours) at 07/16/13 1231 Last data filed at 07/16/13 0800  Gross per 24 hour  Intake    450 ml  Output    125 ml  Net    325 ml     Exam: General: No acute respiratory distress Lungs: Clear to auscultation bilaterally without wheezes or crackles, 3L Cardiovascular: Irregular rate and rhythm without murmur gallop or rub normal S1 and S2, no peripheral edema or JVD Abdomen: Nontender, nondistended, soft, bowel sounds positive, no rebound, no ascites, no appreciable mass Musculoskeletal: No significant cyanosis, clubbing of bilateral lower extremities Neurological: Alert and oriented x 3, moves all extremities x 4 without focal neurological deficits but noticed with generalized symmetrical weakness, CN 2-12 intact  Scheduled Meds:  Scheduled Meds: . ipratropium  0.5 mg Nebulization Q4H   And  . albuterol  2.5 mg Nebulization Q4H  . aztreonam  1 g Intravenous Q8H  . budesonide  0.25 mg Nebulization BID  . clopidogrel  75 mg Oral Daily  . colchicine  0.6 mg Oral BID  . cyclobenzaprine  10 mg Oral Daily  . heparin  5,000 Units Subcutaneous Q8H  . insulin aspart  0-15 Units Subcutaneous TID WC  . insulin aspart  0-5 Units Subcutaneous QHS  . insulin glargine  10 Units Subcutaneous QHS  . isosorbide mononitrate  15 mg Oral Daily  . [START ON 07/17/2013] levothyroxine  25 mcg Oral Custom  . levothyroxine  50 mcg Oral Custom  . metronidazole  500 mg Intravenous Q8H  . oxybutynin  10 mg Oral Daily  . OxyCODONE  20 mg Oral Q12H  . predniSONE  40 mg Oral Q breakfast  . vancomycin  1,250 mg Intravenous Q24H    Continuous Infusions:   **Reviewed in detail by the Attending Physician  Data Reviewed: Basic Metabolic Panel:  Recent Labs Lab 07/11/13 0504 07/11/13 1550 07/12/13 0410 07/13/13 0606 07/15/13 0625 07/15/13 2101 07/15/13 2301 07/16/13 0335  NA 133*  --  137 138 135 132* 135 133*  K 3.0* 4.0 3.9 4.3 5.4* 6.8* 5.3* 5.7*  CL 93*  --  100 102 101 98 102 98  CO2 27  --  28 26 24 20   --  21  GLUCOSE 100*  --  125* 145* 174* 166* 173* 174*  BUN 95*  --  76* 50* 40* 43* 43* 49*  CREATININE 4.22*  --  2.56* 1.53* 1.50* 1.78* 2.10* 1.80*  CALCIUM 6.0*  --  7.2* 7.8* 8.3* 8.2*  --  7.8*  MG 1.2* 1.7 1.7  --   --   --   --   --   PHOS  --   --  4.2 3.1  --   --   --   --    Liver Function Tests:  Recent Labs Lab 07/09/13 1542 07/10/13 0435 07/12/13 0410 07/13/13 0606 07/16/13 0200 07/16/13 0335  AST 24 19  --   --  184* 170*  ALT 33 25  --   --  116* 116*  ALKPHOS 150* 120*  --   --  112 113  BILITOT 0.2* 0.2*  --   --  0.4 0.3  PROT 7.2 5.7*  --   --  5.7* 5.8*  ALBUMIN 3.1* 2.4* 2.4* 2.4* 2.3* 2.2*   No results found for this basename: LIPASE, AMYLASE,  in the last 168 hours No results found for this basename: AMMONIA,  in the last 168 hours CBC:  Recent Labs Lab 07/09/13 1542  07/10/13 0435 07/11/13 0504 07/12/13 0410 07/15/13 1909 07/15/13 2301 07/16/13 0335  WBC 9.2  < > 7.1 4.8 5.2 12.5*  --  11.4*  NEUTROABS 7.3  --  5.7  --   --  11.0*  --  10.2*  HGB 11.6*  < > 9.1* 8.7* 8.9* 8.8* 8.5* 8.3*  HCT 33.5*  < > 26.7* 24.7* 25.7* 27.4* 25.0* 25.1*  MCV 84.8  < > 85.9 83.4 86.0 90.7  --  88.1  PLT 313  < > 267 226 213 268  --  258  < > = values in this interval not displayed. Cardiac Enzymes:  Recent Labs Lab 07/09/13 2230 07/16/13 0200  CKTOTAL 57  --   TROPONINI <0.30 <0.30   BNP (last 3 results)  Recent Labs  06/07/13 1300 06/13/13 0406 07/15/13 2101  PROBNP 3554.0* 1503.0* 31366.0*   CBG:  Recent Labs Lab 07/14/13 2117  07/15/13 0632 07/15/13 1108 07/16/13 0205 07/16/13 0738  GLUCAP 142* 149* 136* 149* 184*    Recent Results (from the past 240 hour(s))  CULTURE, BLOOD (ROUTINE X 2)     Status: None   Collection Time    07/09/13  1:21 AM      Result Value Range Status   Specimen Description BLOOD RIGHT ARM   Final   Special Requests BOTTLES DRAWN AEROBIC AND ANAEROBIC 10CC EACH   Final   Culture  Setup Time     Final   Value: 07/10/2013 08:57     Performed at [facility]   Culture     Final   Value: NO GROWTH 5 DAYS     Performed at [facility]   Report Status 07/16/2013 FINAL   Final  CLOSTRIDIUM DIFFICILE BY PCR     Status: None   Collection Time    07/09/13  4:16 PM      Result Value Range Status   C difficile by pcr NEGATIVE  NEGATIVE Final  URINE CULTURE     Status: None   Collection Time    07/09/13 10:40 PM      Result Value Range Status   Specimen Description URINE, CATHETERIZED   Final   Special Requests NONE   Final   Culture  Setup Time     Final   Value: 07/10/2013 09:06     Performed at [facility] Count     Final   Value: NO GROWTH     Performed at [facility]   Culture     Final   Value: NO GROWTH     Performed at [facility]   Report Status 07/11/2013 FINAL   Final  MRSA PCR SCREENING     Status: None   Collection Time    07/09/13 10:40 PM      Result Value Range Status   MRSA by PCR NEGATIVE  NEGATIVE Final   Comment:            The GeneXpert MRSA Assay (FDA     approved for NASAL specimens  only), is one component of a     comprehensive MRSA colonization     surveillance program. It is not     intended to diagnose MRSA     infection nor to guide or     monitor treatment for     MRSA infections.  CULTURE, BLOOD (ROUTINE X 2)     Status: None   Collection Time    07/10/13  1:14 AM      Result Value Range Status   Specimen Description BLOOD LEFT HAND   Final   Special Requests BOTTLES DRAWN AEROBIC  AND ANAEROBIC 10CC EACH   Final   Culture  Setup Time     Final   Value: 07/10/2013 08:57     Performed at [facility]   Culture     Final   Value: NO GROWTH 5 DAYS     Performed at [facility]   Report Status 07/16/2013 FINAL   Final     Studies:  Recent x-ray studies have been reviewed in detail by the Attending Physician     Junious Silk, ANP [facility] Office  [phone] Pager [phone]  **If unable to reach the above provider after paging please contact the Flow Manager @ [phone]  On-Call/Text Page:      [email or web address]      password [email or web address]  If 7PM-7AM, please contact night-coverage [email or web address] Password [facility] Building 07/16/2013, 12:31 PM   LOS: 1 day   I have examined the patient, reviewed the chart and modified the above note which I agree with.   Jazlynne Milliner,MD [phone] 07/16/2013, 4:47 PM

## 2013-07-17 ENCOUNTER — Inpatient Hospital Stay (HOSPITAL_COMMUNITY): Payer: Medicare Other

## 2013-07-17 DIAGNOSIS — R0609 Other forms of dyspnea: Secondary | ICD-10-CM

## 2013-07-17 DIAGNOSIS — I10 Essential (primary) hypertension: Secondary | ICD-10-CM

## 2013-07-17 LAB — CBC
HCT: 23.3 % — ABNORMAL LOW (ref 36.0–46.0)
Hemoglobin: 7.6 g/dL — ABNORMAL LOW (ref 12.0–15.0)
MCH: 28.9 pg (ref 26.0–34.0)
MCV: 88.6 fL (ref 78.0–100.0)
RBC: 2.63 MIL/uL — ABNORMAL LOW (ref 3.87–5.11)
WBC: 9.4 10*3/uL (ref 4.0–10.5)

## 2013-07-17 LAB — BASIC METABOLIC PANEL
BUN: 57 mg/dL — ABNORMAL HIGH (ref 6–23)
CO2: 17 mEq/L — ABNORMAL LOW (ref 19–32)
Calcium: 7.7 mg/dL — ABNORMAL LOW (ref 8.4–10.5)
Creatinine, Ser: 1.82 mg/dL — ABNORMAL HIGH (ref 0.50–1.10)
GFR calc Af Amer: 30 mL/min — ABNORMAL LOW (ref >90)
GFR calc non Af Amer: 26 mL/min — ABNORMAL LOW (ref >90)
Glucose, Bld: 286 mg/dL — ABNORMAL HIGH (ref 70–99)

## 2013-07-17 MED ORDER — METOPROLOL TARTRATE 25 MG PO TABS
25.0000 mg | ORAL_TABLET | Freq: Two times a day (BID) | ORAL | Status: DC
  Administered 2013-07-17 – 2013-07-22 (×10): 25 mg via ORAL
  Filled 2013-07-17 (×11): qty 1

## 2013-07-17 MED ORDER — FUROSEMIDE 10 MG/ML IJ SOLN: 40.0000 mg | Freq: Once | INTRAMUSCULAR | Status: DC

## 2013-07-17 MED ORDER — METRONIDAZOLE 500 MG PO TABS
500.0000 mg | ORAL_TABLET | Freq: Three times a day (TID) | ORAL | Status: DC
  Administered 2013-07-17 – 2013-07-19 (×5): 500 mg via ORAL
  Filled 2013-07-17 (×8): qty 1

## 2013-07-17 MED ORDER — COLCHICINE 0.6 MG PO TABS
0.3000 mg | ORAL_TABLET | Freq: Two times a day (BID) | ORAL | Status: DC
  Administered 2013-07-17 – 2013-07-22 (×10): 0.3 mg via ORAL
  Filled 2013-07-17 (×12): qty 0.5

## 2013-07-17 NOTE — Clinical Documentation Improvement (Signed)
  Per completed RD eval 12/4:"Pt meets criteria for Severe malnutrition in the context of chronic illness as evidenced by 6% weight loss in one month associated with intake </= 75% of estimated energy requirement for >/= 1 month."   If you agree with "Severe Protein Calorie Malnutrition" please update documentation in Notes and DC summary. Thank you.  Supporting Information: - weight decrease in 1 month from 205lbs to 195lbs in context of chronic and acute illness  Thank You, Beverley Fiedler ,RN Clinical Documentation Specialist:  [phone]  [facility]- Health Information Management

## 2013-07-17 NOTE — Progress Notes (Signed)
Dr Eldridge Dace called back and was updated on patient cardiac rhythm of a-fib, rate between 110's to 120's.  He stated that he will be here to see the patient.

## 2013-07-17 NOTE — Procedures (Signed)
Objective Swallowing Evaluation: Modified Barium Swallowing Study  Patient Details  Name: [name] MRN: [id number] Date of Birth: 01/11/1934  Today's Date: 07/17/2013 Time: 4098-1191 SLP Time Calculation (min): 25 min  Past Medical History:  Past Medical History  Diagnosis Date  . CAD (coronary artery disease)   . CHF (congestive heart failure)   . Paroxysmal atrial fibrillation   . Bradycardia   . Hypertension   . Renal artery stenosis   . COPD (chronic obstructive pulmonary disease)   . Obesity hypoventilation syndrome   . Pruritus   . Tracheobronchitis   . Acute rhinosinusitis   . Anemia   . Hypoxemia   . Peripheral edema   . Obesity, endogenous   . Diabetes mellitus   . Acid reflux disease   . Osteoarthritis, generalized   . Hypothyroidism   . Lung nodule     lingula  . OSA (obstructive sleep apnea)     NPSG 04/03/00--AHI 28.hr  . DVT (deep venous thrombosis) 2003    left leg  . Vaginal atrophy   . Hx of colonic polyps   . Acute diastolic heart failure   . Secondary pulmonary hypertension 06/09/2013   Past Surgical History:  Past Surgical History  Procedure Laterality Date  . Lumbar spine surgery    . Cholecystectomy    . Hemorroidectomy    . Repair wound fistula    . Right shoulder    . Elbow surgery    . Dilation and curettage of uterus    . Breast surgery  1998    lumpectomy  . Oophorectomy  1959    BSO-? ovarian cancer   HPI:  79 year old female admitted 07/15/13 due to SOB, cough and weakness.  Pt had been DC'd on 12/3 (following stay for PNA, N/V) but was readmitted same day.  PMH significant for CAD, CHF, AFib, HTN, OSA, COPD, DM, GERD, recurrent PNA.  BSE requested to evaluate swallow function and safety.     Assessment / Plan / Recommendation Clinical Impression  Dysphagia Diagnosis: Mild oral phase dysphagia;Mild pharyngeal phase dysphagia Clinical impression: Patient presents with mild oropharyngeal dysphagia characterized by  swallow initiation delay to vallecular sinus,mild vallecular and posterior pharynx residuals, which cleared with second swallow, and flash penetration of thin liquids, which fully cleared without further incident. Patient exhibited decreased anterior to posterior transit of solid texture bolus. No aspiration observed during this MBSS.    Treatment Recommendation       Diet Recommendation Dysphagia 3 (Mechanical Soft);Thin liquid   Liquid Administration via: Cup;Straw Medication Administration: Whole meds with liquid Supervision: Patient able to self feed;Intermittent supervision to cue for compensatory strategies Compensations: Slow rate;Small sips/bites;Follow solids with liquid    Other  Recommendations Oral Care Recommendations: Oral care BID   Follow Up Recommendations  Inpatient Rehab    Frequency and Duration min 2x/week  1 week   Pertinent Vitals/Pain     SLP Swallow Goals   Patient will utilize recommended strategies during swallow to increase swallowing safety with modified independence. Patient will tolerate trials of upgraded solids with no overt s/s aspiration.    General Date of Onset: 07/15/13 HPI: 79 year old female admitted 07/15/13 due to SOB, cough and weakness.  Pt had been DC'd on 12/3 (following stay for PNA, N/V) but was readmitted same day.  PMH significant for CAD, CHF, AFib, HTN, OSA, COPD, DM, GERD, recurrent PNA.  BSE requested to evaluate swallow function and safety. Type of Study: Modified Barium Swallowing  Study Reason for Referral: Objectively evaluate swallowing function Previous Swallow Assessment: BSE Diet Prior to this Study: Dysphagia 3 (soft);Thin liquids Temperature Spikes Noted: No History of Recent Intubation: No Behavior/Cognition: Alert;Cooperative;Pleasant mood Oral Cavity - Dentition: Adequate natural dentition Self-Feeding Abilities: Able to feed self Patient Positioning: Upright in chair Baseline Vocal Quality: Clear Volitional  Cough: Strong Volitional Swallow: Able to elicit    Reason for Referral Objectively evaluate swallowing function   Oral Phase Oral Preparation/Oral Phase Oral Phase: Impaired Oral - Solids Oral - Regular: Delayed oral transit Oral Phase - Comment Oral Phase - Comment: delayed anterior to posterior transit of regular solid bolus   Pharyngeal Phase Pharyngeal Phase Pharyngeal Phase: Impaired Pharyngeal - Thin Pharyngeal - Thin Cup: Penetration/Aspiration during swallow Penetration/Aspiration details (thin cup): Material enters airway, remains ABOVE vocal cords then ejected out;Material enters airway, remains ABOVE vocal cords and not ejected out Pharyngeal - Thin Straw: Penetration/Aspiration during swallow Pharyngeal - Solids Pharyngeal - Puree: Pharyngeal residue - valleculae (mild, clears with second swallow) Pharyngeal - Regular: Delayed swallow initiation;Pharyngeal residue - valleculae;Pharyngeal residue - posterior pharnyx Pharyngeal - Pill: Pharyngeal residue - valleculae Pharyngeal Phase - Comment Pharyngeal Comment: Pharyngeal residuals cleared with cued and uncued second swallow performed by patient  Cervical Esophageal Phase    GO    Cervical Esophageal Phase Cervical Esophageal Phase: [name]         [name] 07/17/2013, 6:07 PM  Terri Nevin, MA, CCC-SLP [facility] Speech-Language Pathologist

## 2013-07-17 NOTE — Progress Notes (Signed)
Physical Therapy Treatment Patient Details Name: [name] MRN: [id number] DOB: [birth year] Today's Date: 07/17/2013 Time: 7829-5621 PT Time Calculation (min): 39 min  PT Assessment / Plan / Recommendation  History of Present Illness Pt readmitted to hospital after dc home earlier in the day.  Unable to get out of car at home and went down to ground.  Returned to hospital via EMS. Pt found to have HCAP.   PT Comments   Patient limited with ambulation due to fear and multiple lines.  Patient prefers to go to community rehab where she went in the recent past ([facility]  Feel she will benefit from further skilled PT after d/c prior to d/c home.  Follow Up Recommendations  SNF     Does the patient have the potential to tolerate intense rehabilitation   N/A  Barriers to Discharge  None      Equipment Recommendations  None recommended by PT    Recommendations for Other Services  None  Frequency Min 3X/week   Progress towards PT Goals Progress towards PT goals: Progressing toward goals  Plan Discharge plan needs to be updated    Precautions / Restrictions Precautions Precautions: Fall   Pertinent Vitals/Pain HR max 135 SpO2 lowest 88% on 3 L (mostly above 90% during activity)    Mobility  Bed Mobility Supine to Sit: 3: Mod assist;HOB elevated;With rails Sitting - Scoot to Edge of Bed: 3: Mod assist Details for Bed Mobility Assistance: Patient needed help lifting trunk upright and moving legs/hips to edge of bed Transfers Transfers: Sit to Stand;Stand to Sit;Stand Pivot Transfers Sit to Stand: From bed;With upper extremity assist;3: Mod assist Stand to Sit: To chair/3-in-1;3: Mod assist;With armrests Stand Pivot Transfers: 3: Mod assist Details for Transfer Assistance: fearful to stand from bed even with grip on bedrail, c/o her hand slipping Ambulation/Gait Ambulation/Gait Assistance: Not tested (comment) (pt c/o too weak to walk without 2 person assist)       PT Goals (current goals can now be found in the care plan section)    Visit Information  Last PT Received On: 07/17/13 Assistance Needed: +2 (for lines) History of Present Illness: Pt readmitted to hospital after dc home earlier in the day.  Unable to get out of car at home and went down to ground.  Returned to hospital via EMS. Pt found to have HCAP.    Subjective Data   Still haven't had anything to eat!   Cognition  Cognition Arousal/Alertness: Awake/alert Behavior During Therapy: WFL for tasks assessed/performed Overall Cognitive Status: Impaired/Different from baseline Area of Impairment: Safety/judgement Safety/Judgement: Decreased awareness of deficits    Pension scheme manager Standing - Balance Support: Bilateral upper extremity supported Static Standing - Level of Assistance: 4: Min assist  End of Session PT - End of Session Equipment Utilized During Treatment: Gait belt;Oxygen Activity Tolerance: Patient limited by fatigue;Other (comment) (limited by fear) Patient left: in chair;with call bell/phone within reach;with family/visitor present Nurse Communication: Other (comment) (need to fix O2 sat monitor)   GP     [facility] 07/17/2013, 1:13 PM [place], [place] [phone] 07/17/2013

## 2013-07-17 NOTE — Progress Notes (Signed)
Patient states that she will not wear CPAP tonight. No distress noted. Pt understands to let RN know if she changes her mind. RT will continue to monitor.

## 2013-07-17 NOTE — Progress Notes (Addendum)
SUBJECTIVE:  Breathing improved somewhat.  Coughed up some mucous.   OBJECTIVE:   Vitals:   Filed Vitals:   07/17/13 0814 07/17/13 0910 07/17/13 1200 07/17/13 1504  BP:  127/64 130/53   Pulse:   113   Temp:   98 F (36.7 C)   TempSrc:   Oral   Resp:   29   Height:      Weight:      SpO2: 100%  100% 96%   I&O's:   Intake/Output Summary (Last 24 hours) at 07/17/13 1544 Last data filed at 07/17/13 0400  Gross per 24 hour  Intake     50 ml  Output   1350 ml  Net  -1300 ml   TELEMETRY: Reviewed telemetry pt in AFib:     PHYSICAL EXAM General: Frail, SHOB Head: Eyes PERRLA, No xanthomas.   Normal cephalic and atramatic  Lungs:   Wheezing bilaterally. Heart:  Tachycardic S1 S2  No JVD.  Abdomen:  abdomen soft and non-tender  Msk:  Back normal, normal gait. Normal strength and tone for age. Extremities:  Trace pretibial edema.   Neuro: Alert and oriented X 3. Psych:  Good affect, responds appropriately   LABS: Basic Metabolic Panel:  Recent Labs  16/10/96 1745 07/17/13 0420  NA 134* 137  K 5.7* 4.8  CL 98 101  CO2 15* 17*  GLUCOSE 265* 286*  BUN 56* 57*  CREATININE 1.86* 1.82*  CALCIUM 8.0* 7.7*   Liver Function Tests:  Recent Labs  07/16/13 0200 07/16/13 0335  AST 184* 170*  ALT 116* 116*  ALKPHOS 112 113  BILITOT 0.4 0.3  PROT 5.7* 5.8*  ALBUMIN 2.3* 2.2*   No results found for this basename: LIPASE, AMYLASE,  in the last 72 hours CBC:  Recent Labs  07/15/13 1909  07/16/13 0335 07/17/13 0420  WBC 12.5*  --  11.4* 9.4  NEUTROABS 11.0*  --  10.2*  --   HGB 8.8*  < > 8.3* 7.6*  HCT 27.4*  < > 25.1* 23.3*  MCV 90.7  --  88.1 88.6  PLT 268  --  258 287  < > = values in this interval not displayed. Cardiac Enzymes:  Recent Labs  07/16/13 0200  TROPONINI <0.30   BNP: No components found with this basename: POCBNP,  D-Dimer: No results found for this basename: DDIMER,  in the last 72 hours Hemoglobin A1C: No results found for this  basename: HGBA1C,  in the last 72 hours Fasting Lipid Panel: No results found for this basename: CHOL, HDL, LDLCALC, TRIG, CHOLHDL, LDLDIRECT,  in the last 72 hours Thyroid Function Tests: No results found for this basename: TSH, T4TOTAL, FREET3, T3FREE, THYROIDAB,  in the last 72 hours Anemia Panel: No results found for this basename: VITAMINB12, FOLATE, FERRITIN, TIBC, IRON, RETICCTPCT,  in the last 72 hours Coag Panel:   Lab Results  Component Value Date   INR 1.54* 07/15/2013   INR 1.19 06/10/2013   INR 1.26 06/09/2013    RADIOLOGY: Ct Abdomen Pelvis Wo Contrast  07/09/2013   CLINICAL DATA:  Abdominal pain. Nausea. Diarrhea. Acute renal failure.  EXAM: CT ABDOMEN AND PELVIS WITHOUT CONTRAST  TECHNIQUE: Multidetector CT imaging of the abdomen and pelvis was performed following the standard protocol without intravenous contrast.  COMPARISON:  05/20/2007  FINDINGS: Surgical clips noted from prior cholecystectomy. Noncontrast images of the liver, spleen, pancreas, and adrenal glands are normal in appearance. No evidence of renal calculi or hydronephrosis.  No soft  tissue masses or lymphadenopathy identified within the abdomen or pelvis. Prior hysterectomy noted. Adnexal regions are unremarkable. Foley catheter seen within the bladder, and a pessary noted in the vagina. Sigmoid diverticulosis is noted, however there is no evidence of diverticulitis. No evidence of inflammatory process or abnormal fluid collections.  IMPRESSION: No acute findings.  No evidence of hydronephrosis.  Diverticulosis. No radiographic evidence of diverticulitis.   Electronically Signed   By: Myles Rosenthal M.D.   On: 07/09/2013 22:42   Dg Chest 2 View  07/09/2013   CLINICAL DATA:  Abdominal pain with nausea and weakness, recent history of pneumonia  EXAM: CHEST  2 VIEW  COMPARISON:  CT scan of the chest of June 07, 2013.  FINDINGS: The lungs are well-expanded. The posterior right lower lobe infiltrate demonstrated on the  previous CT scan is not evident on this chest x-ray. The cardiac silhouette is top-normal in size. The pulmonary vascularity is not engorged. There is no pleural effusion. There is no pneumothorax. There is mild loss of height of multiple mid and lower thoracic vertebral bodies without evidence of high-grade which compression. The observed portions of the upper abdomen reveal no definite abnormal gas collection.  IMPRESSION: 1. There is no evidence of residual pneumonia in the right lower lobe. 2. There is borderline enlargement of cardiac silhouette without overt evidence of CHF.   Electronically Signed   By: David  [place]   On: 07/09/2013 16:49   Dg Chest Port 1 View  07/15/2013   CLINICAL DATA:  Respiratory failure and fatigue. CHF. COPD. Diabetes.  EXAM: PORTABLE CHEST - 1 VIEW  COMPARISON:  07/11/2013  FINDINGS: Right glenohumeral joint arthroplasty. Cardiomegaly accentuated by AP portable technique. Advanced aortic atherosclerosis. Possible small pleural effusions. No pneumothorax. Patchy right upper and right lower lobe airspace disease. Minimal left base airspace disease.  IMPRESSION: Markedly worsened aeration, with right greater than left bilateral airspace disease. Favor infection or aspiration. Asymmetric pulmonary edema felt less likely.  Cardiomegaly with atherosclerosis and probable small bilateral pleural effusions.   Electronically Signed   By: Jeronimo Greaves M.D.   On: 07/15/2013 19:39   Dg Chest Port 1 View  07/11/2013   CLINICAL DATA:  Hypoxia.  EXAM: PORTABLE CHEST - 1 VIEW  COMPARISON:  07/09/2013.  FINDINGS: Right lower lobe infiltrate has cleared. Minimal basilar atelectasis present. Heart size is stable. Pulmonary vascularity is normal. Right shoulder replacement. Degenerative changes left shoulder.  IMPRESSION: Previously identified right lower lobe infiltrate has cleared. Mild basilar atelectasis.   Electronically Signed   By: Maisie Fus  Register   On: 07/11/2013 15:04       ASSESSMENT: AFib, rapid ventricular response; pneumonia, anemia, renal insufficiency  PLAN:  AFib: COntinue Cardizem.  Add back metoprolol.  Titrate as BP tolerates.  Acute on chronic diastolic heart failure. Continue Lasix as tolerated by renal function.  ABx for pneumonia.  Anemia is exacerbating rapid ventricular response.  Chronic illness.  Issues becoming more problematic.    Corky Crafts., MD  07/17/2013  3:44 PM

## 2013-07-17 NOTE — Progress Notes (Signed)
CBGs on 12/4: 184-210-248-215-238 mg/dl                  16/1: 269 mg/dl Consider increasing Lantus to 20-25 units daily if CBGs continue greater than 180 mg/dl.  Patient does take Lantus 30 units daily at home.    Smith Mince RN BSN CDE

## 2013-07-17 NOTE — Progress Notes (Signed)
Dr Don Broach notified that patient was back in AFIB, HR in the 120,s, no new orders received. Will continue to monitor patient. Adria Dill RN

## 2013-07-17 NOTE — Progress Notes (Addendum)
[facility] Progress Note Secretary TEAM 1 - Stepdown ICU Team   [name] XBJ:[id number] DOB: 1933-08-28 DOA: 07/15/2013 PCP: Gweneth Dimitri, MD  Brief narrative: 79 year old female patient with oxygen-dependent COPD, atrial fib, CHF and diabetes. Readmitted the same day after being discharged from the hospital- was hospitalized for acute renal failure from nausea (due to amiodarone) and diarrhea. She returned home via private vehicle and was unable to get out from the car due to complaints of severe weakness and shortness of breath. Eventually she was able to get out of the car but was so weak she stat on the ground. The family was unable to get the patient back in the car.  EMS was called.  Upon arrival to the ER patient was found to be tachypneic and hypoxic on room air. On exam she had diminished air movement bilaterally. Chest x-ray demonstrated pneumonia with also superimposed mild pulmonary edema. Her symptoms did not improve on nasal cannula oxygen nor with nonrebreather mask and due to increased tachypnea and work of breathing she was placed on BiPAP. She was given a dose of Lasix in the ER but then her blood pressure dropped. She was empirically started on vancomycin and aztreonam for hospital-acquired pneumonia. She was also found to be hyperkalemic without any EKG changes.  Assessment/Plan: Active Problems: Acute and chronic respiratory failure with hypoxia   A) Acute on chronic grade 2 diastolic heart failure - cardiology following and directing diuresis   B) HCAP (healthcare-associated pneumonia) - R > L multifocal infiltrates -has weaned down to 3L (home is 2L) -cont empiric anbx's - added Flagyl due to concerns of ? aspiration etiology -SLP eval- MBS completed- Dysphagia 3 thin liquids    COPD with ? acute exacerbation -not wheezing so doubt exacerbation therefore have tapered steroids    Hyperkalemia -less than admit but still persistant -does not  appear was on exogenous KCL at home -K improved  Anemia - mild drop in Hgb noted - cont to follow - no overt bleeding noted.     HYPOTHYROIDISM -Synthroid -Thyroid labs obtained on the 11th 30 refill a TSH of 2.9 and 7 and a free T4 of 1.35   Atrial fibrillation - rate difficult to control in the past- Amiodarone was effective in controlling rate but has not been able to tolerate Amiodarone due to severe nausea -Deemed not a candidate for TEE cardioversion and/or ablation unless she takes anticoagulation and per recent discussion with Dr. Eldridge Dace on 07/15/2013 patient did not want to begin Coumadin -Recommendations on 12/03 from cardiology were to consult EP -Continue Plavix - cardiology following    DM -CBG stable -cont SSI and Lantus since beginning diet -?? Also on Amaryl pre admit   Deconditioning -PT/OT eval- may need CIR vs SNF prior to going back home     DVT prophylaxis: Subcutaneous heparin Code Status: Full Family Communication: Patient only no family at bedside during rounds Disposition Plan/Expected LOS: Transfer to telemetry since remains hyperkalemic. Pending PT and OT evaluations given severe weakness at presentation may benefit from either skilled nursing facility or rehabilitation stay before returning to home Isolation: None Nutritional Status:  protein calorie malnutrition related to acute pulmonary illness  Consultants: Cardiology  Procedures: Modified barium swallow study pending  Antibiotics: Azactam 12/03 >>> Flagyl 12/04 >>> Vancomycin 12/03 >>>  HPI/Subjective: Alert- has not significant complaints. Breathing no different from yesterday.   Objective: Blood pressure 127/64, pulse 104, temperature 97.6 F (36.4 C), temperature source Oral, resp. rate 26, height  5' (1.524 m), weight 88.6 kg (195 lb 5.2 oz), SpO2 100.00%.  Intake/Output Summary (Last 24 hours) at 07/17/13 1430 Last data filed at 07/17/13 0400  Gross per 24 hour  Intake      50 ml  Output   1350 ml  Net  -1300 ml     Exam: General: No acute respiratory distress Lungs: Clear to auscultation bilaterally without wheezes or crackles, 3L Cardiovascular: Irregular rate and rhythm without murmur gallop or rub normal S1 and S2, no peripheral edema or JVD- HR in 110-120 range at rest  Abdomen: Nontender, nondistended, soft, bowel sounds positive, no rebound, no ascites, no appreciable mass Musculoskeletal: No significant cyanosis, clubbing of bilateral lower extremities Neurological: Alert and oriented x 3, moves all extremities x 4 without focal neurological deficits but noticed with generalized symmetrical weakness, CN 2-12 intact  Scheduled Meds:  Scheduled Meds: . ipratropium  0.5 mg Nebulization TID   And  . albuterol  2.5 mg Nebulization TID  . aztreonam  1 g Intravenous Q8H  . budesonide  0.25 mg Nebulization BID  . clopidogrel  75 mg Oral Daily  . colchicine  0.6 mg Oral BID  . cyclobenzaprine  10 mg Oral Daily  . diltiazem  360 mg Oral Daily  . glimepiride  4 mg Oral Q breakfast  . heparin  5,000 Units Subcutaneous Q8H  . insulin aspart  0-15 Units Subcutaneous TID WC  . insulin aspart  0-5 Units Subcutaneous QHS  . insulin glargine  15 Units Subcutaneous QHS  . isosorbide mononitrate  15 mg Oral Daily  . levothyroxine  25 mcg Oral Custom  . levothyroxine  50 mcg Oral Custom  . metroNIDAZOLE  500 mg Oral Q8H  . oxybutynin  10 mg Oral Daily  . OxyCODONE  20 mg Oral Q12H  . predniSONE  40 mg Oral Q breakfast  . ramelteon  8 mg Oral QHS  . vancomycin  1,250 mg Intravenous Q24H   Continuous Infusions:   **Reviewed in detail by the Attending Physician  Data Reviewed: Basic Metabolic Panel:  Recent Labs Lab 07/11/13 0504 07/11/13 1550 07/12/13 0410 07/13/13 0606  07/15/13 2101 07/15/13 2301 07/16/13 0335 07/16/13 1145 07/16/13 1745 07/17/13 0420  NA 133*  --  137 138  < > 132* 135 133* 134* 134* 137  K 3.0* 4.0 3.9 4.3  < > 6.8*  5.3* 5.7* 5.4* 5.7* 4.8  CL 93*  --  100 102  < > 98 102 98 98 98 101  CO2 27  --  28 26  < > 20  --  21 15* 15* 17*  GLUCOSE 100*  --  125* 145*  < > 166* 173* 174* 224* 265* 286*  BUN 95*  --  76* 50*  < > 43* 43* 49* 50* 56* 57*  CREATININE 4.22*  --  2.56* 1.53*  < > 1.78* 2.10* 1.80* 1.83* 1.86* 1.82*  CALCIUM 6.0*  --  7.2* 7.8*  < > 8.2*  --  7.8* 8.0* 8.0* 7.7*  MG 1.2* 1.7 1.7  --   --   --   --   --   --   --   --   PHOS  --   --  4.2 3.1  --   --   --   --   --   --   --   < > = values in this interval not displayed. Liver Function Tests:  Recent Labs Lab 07/12/13 0410 07/13/13 0606  07/16/13 0200 07/16/13 0335  AST  --   --  184* 170*  ALT  --   --  116* 116*  ALKPHOS  --   --  112 113  BILITOT  --   --  0.4 0.3  PROT  --   --  5.7* 5.8*  ALBUMIN 2.4* 2.4* 2.3* 2.2*   No results found for this basename: LIPASE, AMYLASE,  in the last 168 hours No results found for this basename: AMMONIA,  in the last 168 hours CBC:  Recent Labs Lab 07/11/13 0504 07/12/13 0410 07/15/13 1909 07/15/13 2301 07/16/13 0335 07/17/13 0420  WBC 4.8 5.2 12.5*  --  11.4* 9.4  NEUTROABS  --   --  11.0*  --  10.2*  --   HGB 8.7* 8.9* 8.8* 8.5* 8.3* 7.6*  HCT 24.7* 25.7* 27.4* 25.0* 25.1* 23.3*  MCV 83.4 86.0 90.7  --  88.1 88.6  PLT 226 213 268  --  258 287   Cardiac Enzymes:  Recent Labs Lab 07/16/13 0200  TROPONINI <0.30   BNP (last 3 results)  Recent Labs  06/07/13 1300 06/13/13 0406 07/15/13 2101  PROBNP 3554.0* 1503.0* 31366.0*   CBG:  Recent Labs Lab 07/16/13 1140 07/16/13 1520 07/16/13 1815 07/16/13 2118 07/17/13 0804  GLUCAP 210* 248* 214* 238* 269*    Recent Results (from the past 240 hour(s))  CULTURE, BLOOD (ROUTINE X 2)     Status: None   Collection Time    07/09/13  1:21 AM      Result Value Range Status   Specimen Description BLOOD RIGHT ARM   Final   Special Requests BOTTLES DRAWN AEROBIC AND ANAEROBIC 10CC EACH   Final   Culture  Setup Time      Final   Value: 07/10/2013 08:57     Performed at [facility]   Culture     Final   Value: NO GROWTH 5 DAYS     Performed at [facility]   Report Status 07/16/2013 FINAL   Final  CLOSTRIDIUM DIFFICILE BY PCR     Status: None   Collection Time    07/09/13  4:16 PM      Result Value Range Status   C difficile by pcr NEGATIVE  NEGATIVE Final  URINE CULTURE     Status: None   Collection Time    07/09/13 10:40 PM      Result Value Range Status   Specimen Description URINE, CATHETERIZED   Final   Special Requests NONE   Final   Culture  Setup Time     Final   Value: 07/10/2013 09:06     Performed at [facility] Count     Final   Value: NO GROWTH     Performed at [facility]   Culture     Final   Value: NO GROWTH     Performed at [facility]   Report Status 07/11/2013 FINAL   Final  MRSA PCR SCREENING     Status: None   Collection Time    07/09/13 10:40 PM      Result Value Range Status   MRSA by PCR NEGATIVE  NEGATIVE Final   Comment:            The GeneXpert MRSA Assay (FDA     approved for NASAL specimens     only), is one component of a     comprehensive MRSA colonization  surveillance program. It is not     intended to diagnose MRSA     infection nor to guide or     monitor treatment for     MRSA infections.  CULTURE, BLOOD (ROUTINE X 2)     Status: None   Collection Time    07/10/13  1:14 AM      Result Value Range Status   Specimen Description BLOOD LEFT HAND   Final   Special Requests BOTTLES DRAWN AEROBIC AND ANAEROBIC 10CC EACH   Final   Culture  Setup Time     Final   Value: 07/10/2013 08:57     Performed at [facility]   Culture     Final   Value: NO GROWTH 5 DAYS     Performed at [facility]   Report Status 07/16/2013 FINAL   Final  CULTURE, BLOOD (ROUTINE X 2)     Status: None   Collection Time    07/15/13  8:34 PM      Result Value Range Status   Specimen Description BLOOD  ARM RIGHT   Final   Special Requests BOTTLES DRAWN AEROBIC ONLY 3CC   Final   Culture  Setup Time     Final   Value: 07/16/2013 01:45     Performed at [facility]   Culture     Final   Value:        BLOOD CULTURE RECEIVED NO GROWTH TO DATE CULTURE WILL BE HELD FOR 5 DAYS BEFORE ISSUING A FINAL NEGATIVE REPORT     Performed at [facility]   Report Status PENDING   Incomplete  CULTURE, BLOOD (ROUTINE X 2)     Status: None   Collection Time    07/15/13  8:45 PM      Result Value Range Status   Specimen Description BLOOD ARM LEFT   Final   Special Requests BOTTLES DRAWN AEROBIC ONLY 10CC   Final   Culture  Setup Time     Final   Value: 07/16/2013 01:45     Performed at [facility]   Culture     Final   Value:        BLOOD CULTURE RECEIVED NO GROWTH TO DATE CULTURE WILL BE HELD FOR 5 DAYS BEFORE ISSUING A FINAL NEGATIVE REPORT     Performed at [facility]   Report Status PENDING   Incomplete     Studies:  Recent x-ray studies have been reviewed in detail by the Attending Physician   Krystiana Fornes,MD [phone] 07/17/2013, 2:30 PM

## 2013-07-18 LAB — BASIC METABOLIC PANEL
BUN: 57 mg/dL — ABNORMAL HIGH (ref 6–23)
CO2: 24 mEq/L (ref 19–32)
Calcium: 7.5 mg/dL — ABNORMAL LOW (ref 8.4–10.5)
Chloride: 99 mEq/L (ref 96–112)
Creatinine, Ser: 1.52 mg/dL — ABNORMAL HIGH (ref 0.50–1.10)
Glucose, Bld: 312 mg/dL — ABNORMAL HIGH (ref 70–99)

## 2013-07-18 LAB — CBC
HCT: 24.4 % — ABNORMAL LOW (ref 36.0–46.0)
Hemoglobin: 8 g/dL — ABNORMAL LOW (ref 12.0–15.0)
MCH: 28.9 pg (ref 26.0–34.0)
MCHC: 32.8 g/dL (ref 30.0–36.0)
MCV: 88.1 fL (ref 78.0–100.0)
RDW: 19.5 % — ABNORMAL HIGH (ref 11.5–15.5)
WBC: 8.6 10*3/uL (ref 4.0–10.5)

## 2013-07-18 LAB — GLUCOSE, CAPILLARY
Glucose-Capillary: 294 mg/dL — ABNORMAL HIGH (ref 70–99)
Glucose-Capillary: 315 mg/dL — ABNORMAL HIGH (ref 70–99)
Glucose-Capillary: 288 mg/dL — ABNORMAL HIGH (ref 70–99)

## 2013-07-18 MED ORDER — INSULIN GLARGINE 100 UNIT/ML ~~LOC~~ SOLN
10.0000 [IU] | Freq: Every day | SUBCUTANEOUS | Status: DC
  Administered 2013-07-18: 10 [IU] via SUBCUTANEOUS
  Filled 2013-07-18 (×2): qty 0.1

## 2013-07-18 MED ORDER — POLYETHYLENE GLYCOL 3350 17 G PO PACK
17.0000 g | PACK | Freq: Every day | ORAL | Status: DC | PRN
  Filled 2013-07-18: qty 1

## 2013-07-18 MED ORDER — INSULIN GLARGINE 100 UNIT/ML ~~LOC~~ SOLN
20.0000 [IU] | Freq: Every day | SUBCUTANEOUS | Status: DC
  Administered 2013-07-18: 20 [IU] via SUBCUTANEOUS
  Filled 2013-07-18 (×3): qty 0.2

## 2013-07-18 MED ORDER — GLUCERNA SHAKE PO LIQD
237.0000 mL | Freq: Three times a day (TID) | ORAL | Status: DC
  Administered 2013-07-18 – 2013-07-20 (×5): 237 mL via ORAL

## 2013-07-18 MED ORDER — SORBITOL 70 % SOLN
30.0000 mL | Status: AC
  Administered 2013-07-18: 30 mL via ORAL
  Filled 2013-07-18: qty 30

## 2013-07-18 MED ORDER — ALLOPURINOL 100 MG PO TABS
100.0000 mg | ORAL_TABLET | Freq: Every day | ORAL | Status: DC
  Administered 2013-07-18 – 2013-07-22 (×5): 100 mg via ORAL
  Filled 2013-07-18 (×5): qty 1

## 2013-07-18 MED ORDER — FUROSEMIDE 10 MG/ML IJ SOLN
INTRAMUSCULAR | Status: AC
  Filled 2013-07-18: qty 4

## 2013-07-18 MED ORDER — FUROSEMIDE 10 MG/ML IJ SOLN
40.0000 mg | Freq: Once | INTRAMUSCULAR | Status: AC
  Administered 2013-07-18: 40 mg via INTRAVENOUS

## 2013-07-18 NOTE — Progress Notes (Signed)
ANTIBIOTIC CONSULT NOTE - follow up  Pharmacy Consult for Vancomycin and Aztreonam Indication: pneumonia  Allergies  Allergen Reactions  . Cefuroxime Axetil Shortness Of Breath, Swelling and Rash    Throat swelling  . Cephalexin Shortness Of Breath, Swelling and Rash    Throat swelling  . Clarithromycin Shortness Of Breath, Swelling and Rash    Throat swelling  . Doxycycline Shortness Of Breath, Swelling and Rash     rash on legs and feet, throat swelling  . Erythromycin Shortness Of Breath, Swelling and Rash    Throat swelling  . Penicillins Shortness Of Breath, Swelling and Rash    Throat swelling  . Tetracycline Shortness Of Breath, Swelling and Rash    Throat swelling    Patient Measurements: Height: 5' (152.4 cm) Weight: 204 lb 2.3 oz (92.6 kg) IBW/kg (Calculated) : 45.5  Vital Signs: Temp: 98 F (36.7 C) (12/06 1157) Temp src: Oral (12/06 1157) BP: 141/58 mmHg (12/06 1200) Pulse Rate: 96 (12/06 1200) Intake/Output from previous day: 12/05 0701 - 12/06 0700 In: 520 [P.O.:120; IV Piggyback:400] Out: 1750 [Urine:1750] Intake/Output from this shift: Total I/O In: 240 [P.O.:240] Out: -   Labs:  Recent Labs  07/16/13 0335  07/16/13 1745 07/17/13 0420 07/18/13 0510  WBC 11.4*  --   --  9.4 8.6  HGB 8.3*  --   --  7.6* 8.0*  PLT 258  --   --  287 317  CREATININE 1.80*  < > 1.86* 1.82* 1.52*  < > = values in this interval not displayed. Estimated Creatinine Clearance: 31.5 ml/min (by C-G formula based on Cr of 1.52). No results found for this basename: VANCOTROUGH, Leodis Binet, VANCORANDOM, GENTTROUGH, GENTPEAK, GENTRANDOM, TOBRATROUGH, TOBRAPEAK, TOBRARND, AMIKACINPEAK, AMIKACINTROU, AMIKACIN,  in the last 72 hours   Microbiology: Recent Results (from the past 720 hour(s))  CULTURE, BLOOD (ROUTINE X 2)     Status: None   Collection Time    07/09/13  1:21 AM      Result Value Range Status   Specimen Description BLOOD RIGHT ARM   Final   Special Requests  BOTTLES DRAWN AEROBIC AND ANAEROBIC 10CC EACH   Final   Culture  Setup Time     Final   Value: 07/10/2013 08:57     Performed at [facility]   Culture     Final   Value: NO GROWTH 5 DAYS     Performed at [facility]   Report Status 07/16/2013 FINAL   Final  CLOSTRIDIUM DIFFICILE BY PCR     Status: None   Collection Time    07/09/13  4:16 PM      Result Value Range Status   C difficile by pcr NEGATIVE  NEGATIVE Final  URINE CULTURE     Status: None   Collection Time    07/09/13 10:40 PM      Result Value Range Status   Specimen Description URINE, CATHETERIZED   Final   Special Requests NONE   Final   Culture  Setup Time     Final   Value: 07/10/2013 09:06     Performed at [facility] Count     Final   Value: NO GROWTH     Performed at [facility]   Culture     Final   Value: NO GROWTH     Performed at [facility]   Report Status 07/11/2013 FINAL   Final  MRSA PCR SCREENING  Status: None   Collection Time    07/09/13 10:40 PM      Result Value Range Status   MRSA by PCR NEGATIVE  NEGATIVE Final   Comment:            The GeneXpert MRSA Assay (FDA     approved for NASAL specimens     only), is one component of a     comprehensive MRSA colonization     surveillance program. It is not     intended to diagnose MRSA     infection nor to guide or     monitor treatment for     MRSA infections.  CULTURE, BLOOD (ROUTINE X 2)     Status: None   Collection Time    07/10/13  1:14 AM      Result Value Range Status   Specimen Description BLOOD LEFT HAND   Final   Special Requests BOTTLES DRAWN AEROBIC AND ANAEROBIC 10CC EACH   Final   Culture  Setup Time     Final   Value: 07/10/2013 08:57     Performed at [facility]   Culture     Final   Value: NO GROWTH 5 DAYS     Performed at [facility]   Report Status 07/16/2013 FINAL   Final  CULTURE, BLOOD (ROUTINE X 2)     Status: None   Collection Time     07/15/13  8:34 PM      Result Value Range Status   Specimen Description BLOOD ARM RIGHT   Final   Special Requests BOTTLES DRAWN AEROBIC ONLY 3CC   Final   Culture  Setup Time     Final   Value: 07/16/2013 01:45     Performed at [facility]   Culture     Final   Value:        BLOOD CULTURE RECEIVED NO GROWTH TO DATE CULTURE WILL BE HELD FOR 5 DAYS BEFORE ISSUING A FINAL NEGATIVE REPORT     Performed at [facility]   Report Status PENDING   Incomplete  CULTURE, BLOOD (ROUTINE X 2)     Status: None   Collection Time    07/15/13  8:45 PM      Result Value Range Status   Specimen Description BLOOD ARM LEFT   Final   Special Requests BOTTLES DRAWN AEROBIC ONLY 10CC   Final   Culture  Setup Time     Final   Value: 07/16/2013 01:45     Performed at [facility]   Culture     Final   Value:        BLOOD CULTURE RECEIVED NO GROWTH TO DATE CULTURE WILL BE HELD FOR 5 DAYS BEFORE ISSUING A FINAL NEGATIVE REPORT     Performed at [facility]   Report Status PENDING   Incomplete    Medical History: Past Medical History  Diagnosis Date  . CAD (coronary artery disease)   . CHF (congestive heart failure)   . Paroxysmal atrial fibrillation   . Bradycardia   . Hypertension   . Renal artery stenosis   . COPD (chronic obstructive pulmonary disease)   . Obesity hypoventilation syndrome   . Pruritus   . Tracheobronchitis   . Acute rhinosinusitis   . Anemia   . Hypoxemia   . Peripheral edema   . Obesity, endogenous   . Diabetes mellitus   . Acid reflux disease   . Osteoarthritis,  generalized   . Hypothyroidism   . Lung nodule     lingula  . OSA (obstructive sleep apnea)     NPSG 04/03/00--AHI 28.hr  . DVT (deep venous thrombosis) 2003    left leg  . Vaginal atrophy   . Hx of colonic polyps   . Acute diastolic heart failure   . Secondary pulmonary hypertension 06/09/2013    Medications:  Anti-infectives   Start     Dose/Rate Route Frequency  Ordered Stop   07/17/13 1800  metroNIDAZOLE (FLAGYL) tablet 500 mg     500 mg Oral 3 times per day 07/17/13 0934     07/16/13 1000  metroNIDAZOLE (FLAGYL) IVPB 500 mg     500 mg 100 mL/hr over 60 Minutes Intravenous Every 8 hours 07/16/13 0900 07/17/13 1022   07/16/13 0600  aztreonam (AZACTAM) 1 g in dextrose 5 % 50 mL IVPB     1 g 100 mL/hr over 30 Minutes Intravenous 3 times per day 07/15/13 2149     07/16/13 0600  aztreonam (AZACTAM) 2 g in dextrose 5 % 50 mL IVPB  Status:  Discontinued     2 g 100 mL/hr over 30 Minutes Intravenous 3 times per day 07/16/13 0254 07/16/13 0322   07/15/13 2200  aztreonam (AZACTAM) 2 g in dextrose 5 % 50 mL IVPB     2 g 100 mL/hr over 30 Minutes Intravenous  Once 07/15/13 2124 07/16/13 0119   07/15/13 2200  vancomycin (VANCOCIN) 1,250 mg in sodium chloride 0.9 % 250 mL IVPB     1,250 mg 166.7 mL/hr over 90 Minutes Intravenous Every 24 hours 07/15/13 2149     07/15/13 2130  vancomycin (VANCOCIN) 1,760 mg in sodium chloride 0.9 % 500 mL IVPB  Status:  Discontinued     20 mg/kg  88 kg 250 mL/hr over 120 Minutes Intravenous  Once 07/15/13 2125 07/16/13 0039     Assessment: 79 year old female just discharged earlier 12/3 after being treated for atrial fibrillation, PNA, and weakness.  She is readmitted for weakness and shortness of breath Cxr shows worsening airation R>L , WBC 11.7, LA 3, afebrile.  She has multiple drug allergies, and began Vancomycin and Aztreonam for pneumonia coverage. She is day #4 abx with improved WBC now wnl, weaned O2 4>3L, improved renal fx.  Bcx NGTD  Goal of Therapy:  Vancomycin trough level 15-20 mcg/ml  Plan:  Continue Vancomycin 1250mg  IV q24h Continue Aztreonam 1gm IV q8h Monitor renal function closely Follow available micro data   Leota Sauers Pharm.D. CPP, BCPS Clinical Pharmacist [phone] 07/18/2013 1:20 PM

## 2013-07-18 NOTE — Progress Notes (Signed)
Patient Name: [name] Date of Encounter: 07/18/2013     Active Problems:   HYPOTHYROIDISM   DM   COPD with acute exacerbation   Acute on chronic diastolic heart failure   HCAP (healthcare-associated pneumonia)   Acute and chronic respiratory failure with hypoxia   Hyperkalemia    SUBJECTIVE  Heart rate better controlled since restarting metoprolol yesterday. No increase in dyspnea. No chest pain.  CURRENT MEDS . ipratropium  0.5 mg Nebulization TID   And  . albuterol  2.5 mg Nebulization TID  . allopurinol  100 mg Oral Daily  . aztreonam  1 g Intravenous Q8H  . budesonide  0.25 mg Nebulization BID  . clopidogrel  75 mg Oral Daily  . colchicine  0.3 mg Oral BID  . cyclobenzaprine  10 mg Oral Daily  . diltiazem  360 mg Oral Daily  . feeding supplement (GLUCERNA SHAKE)  237 mL Oral TID BM  . glimepiride  4 mg Oral Q breakfast  . heparin  5,000 Units Subcutaneous Q8H  . insulin aspart  0-15 Units Subcutaneous TID WC  . insulin aspart  0-5 Units Subcutaneous QHS  . insulin glargine  10 Units Subcutaneous Daily  . insulin glargine  15 Units Subcutaneous QHS  . isosorbide mononitrate  15 mg Oral Daily  . levothyroxine  25 mcg Oral Custom  . levothyroxine  50 mcg Oral Custom  . metoprolol tartrate  25 mg Oral BID  . metroNIDAZOLE  500 mg Oral Q8H  . oxybutynin  10 mg Oral Daily  . OxyCODONE  20 mg Oral Q12H  . predniSONE  40 mg Oral Q breakfast  . ramelteon  8 mg Oral QHS  . sorbitol  30 mL Oral NOW  . vancomycin  1,250 mg Intravenous Q24H    OBJECTIVE  Filed Vitals:   07/18/13 0439 07/18/13 0445 07/18/13 0800 07/18/13 0828  BP: 121/74 121/74 134/70   Pulse: 88 86 97   Temp:  97.2 F (36.2 C) 97.5 F (36.4 C)   TempSrc:  Axillary Oral   Resp: 28 25 24    Height:      Weight:  204 lb 2.3 oz (92.6 kg)    SpO2: 95% 97% 90% 92%    Intake/Output Summary (Last 24 hours) at 07/18/13 1125 Last data filed at 07/18/13 0900  Gross per 24 hour  Intake     760 ml  Output   1750 ml  Net   -990 ml   Filed Weights   07/15/13 1856 07/16/13 0600 07/18/13 0445  Weight: 194 lb (87.998 kg) 195 lb 5.2 oz (88.6 kg) 204 lb 2.3 oz (92.6 kg)    PHYSICAL EXAM  General: Pleasant, NAD. Neuro: Alert and oriented X 3. Moves all extremities spontaneously. Psych: Normal affect. HEENT:  Normal  Neck: Supple without bruits or JVD. Lungs:  Bibasilar rales and rhonchi Heart: RRR no s3, s4, or murmurs. Abdomen: Soft, non-tender, non-distended, BS + x 4.  Extremities: No clubbing, cyanosis or edema. DP/PT/Radials 2+ and equal bilaterally.  Accessory Clinical Findings  CBC  Recent Labs  07/15/13 1909  07/16/13 0335 07/17/13 0420 07/18/13 0510  WBC 12.5*  --  11.4* 9.4 8.6  NEUTROABS 11.0*  --  10.2*  --   --   HGB 8.8*  < > 8.3* 7.6* 8.0*  HCT 27.4*  < > 25.1* 23.3* 24.4*  MCV 90.7  --  88.1 88.6 88.1  PLT 268  --  258 287 317  < > =  values in this interval not displayed. Basic Metabolic Panel  Recent Labs  07/17/13 0420 07/18/13 0510  NA 137 133*  K 4.8 4.5  CL 101 99  CO2 17* 24  GLUCOSE 286* 312*  BUN 57* 57*  CREATININE 1.82* 1.52*  CALCIUM 7.7* 7.5*   Liver Function Tests  Recent Labs  07/16/13 0200 07/16/13 0335  AST 184* 170*  ALT 116* 116*  ALKPHOS 112 113  BILITOT 0.4 0.3  PROT 5.7* 5.8*  ALBUMIN 2.3* 2.2*   No results found for this basename: LIPASE, AMYLASE,  in the last 72 hours Cardiac Enzymes  Recent Labs  07/16/13 0200  TROPONINI <0.30   BNP No components found with this basename: POCBNP,  D-Dimer No results found for this basename: DDIMER,  in the last 72 hours Hemoglobin A1C No results found for this basename: HGBA1C,  in the last 72 hours Fasting Lipid Panel No results found for this basename: CHOL, HDL, LDLCALC, TRIG, CHOLHDL, LDLDIRECT,  in the last 72 hours Thyroid Function Tests No results found for this basename: TSH, T4TOTAL, FREET3, T3FREE, THYROIDAB,  in the last 72  hours  TELE  Atrial fib with rates 90-115  ECG    Radiology/Studies  Ct Abdomen Pelvis Wo Contrast  07/09/2013   CLINICAL DATA:  Abdominal pain. Nausea. Diarrhea. Acute renal failure.  EXAM: CT ABDOMEN AND PELVIS WITHOUT CONTRAST  TECHNIQUE: Multidetector CT imaging of the abdomen and pelvis was performed following the standard protocol without intravenous contrast.  COMPARISON:  05/20/2007  FINDINGS: Surgical clips noted from prior cholecystectomy. Noncontrast images of the liver, spleen, pancreas, and adrenal glands are normal in appearance. No evidence of renal calculi or hydronephrosis.  No soft tissue masses or lymphadenopathy identified within the abdomen or pelvis. Prior hysterectomy noted. Adnexal regions are unremarkable. Foley catheter seen within the bladder, and a pessary noted in the vagina. Sigmoid diverticulosis is noted, however there is no evidence of diverticulitis. No evidence of inflammatory process or abnormal fluid collections.  IMPRESSION: No acute findings.  No evidence of hydronephrosis.  Diverticulosis. No radiographic evidence of diverticulitis.   Electronically Signed   By: Myles Rosenthal M.D.   On: 07/09/2013 22:42   Dg Chest 2 View  07/09/2013   CLINICAL DATA:  Abdominal pain with nausea and weakness, recent history of pneumonia  EXAM: CHEST  2 VIEW  COMPARISON:  CT scan of the chest of June 07, 2013.  FINDINGS: The lungs are well-expanded. The posterior right lower lobe infiltrate demonstrated on the previous CT scan is not evident on this chest x-ray. The cardiac silhouette is top-normal in size. The pulmonary vascularity is not engorged. There is no pleural effusion. There is no pneumothorax. There is mild loss of height of multiple mid and lower thoracic vertebral bodies without evidence of high-grade which compression. The observed portions of the upper abdomen reveal no definite abnormal gas collection.  IMPRESSION: 1. There is no evidence of residual pneumonia  in the right lower lobe. 2. There is borderline enlargement of cardiac silhouette without overt evidence of CHF.   Electronically Signed   By: David  [place]   On: 07/09/2013 16:49   Dg Chest Port 1 View  07/15/2013   CLINICAL DATA:  Respiratory failure and fatigue. CHF. COPD. Diabetes.  EXAM: PORTABLE CHEST - 1 VIEW  COMPARISON:  07/11/2013  FINDINGS: Right glenohumeral joint arthroplasty. Cardiomegaly accentuated by AP portable technique. Advanced aortic atherosclerosis. Possible small pleural effusions. No pneumothorax. Patchy right upper and right lower lobe  airspace disease. Minimal left base airspace disease.  IMPRESSION: Markedly worsened aeration, with right greater than left bilateral airspace disease. Favor infection or aspiration. Asymmetric pulmonary edema felt less likely.  Cardiomegaly with atherosclerosis and probable small bilateral pleural effusions.   Electronically Signed   By: Jeronimo Greaves M.D.   On: 07/15/2013 19:39   Dg Chest Port 1 View  07/11/2013   CLINICAL DATA:  Hypoxia.  EXAM: PORTABLE CHEST - 1 VIEW  COMPARISON:  07/09/2013.  FINDINGS: Right lower lobe infiltrate has cleared. Minimal basilar atelectasis present. Heart size is stable. Pulmonary vascularity is normal. Right shoulder replacement. Degenerative changes left shoulder.  IMPRESSION: Previously identified right lower lobe infiltrate has cleared. Mild basilar atelectasis.   Electronically Signed   By: Maisie Fus  Register   On: 07/11/2013 15:04   Dg Swallowing Func-speech Pathology  07/17/2013   [name], CCC-SLP     07/17/2013  6:09 PM Objective Swallowing Evaluation: Modified Barium Swallowing Study   Patient Details  Name: [name] MRN: [id number] Date of Birth: 1934/06/01  Today's Date: 07/17/2013 Time: 5366-4403 SLP Time Calculation (min): 25 min  Past Medical History:  Past Medical History  Diagnosis Date  . CAD (coronary artery disease)   . CHF (congestive heart failure)   . Paroxysmal atrial  fibrillation   . Bradycardia   . Hypertension   . Renal artery stenosis   . COPD (chronic obstructive pulmonary disease)   . Obesity hypoventilation syndrome   . Pruritus   . Tracheobronchitis   . Acute rhinosinusitis   . Anemia   . Hypoxemia   . Peripheral edema   . Obesity, endogenous   . Diabetes mellitus   . Acid reflux disease   . Osteoarthritis, generalized   . Hypothyroidism   . Lung nodule     lingula  . OSA (obstructive sleep apnea)     NPSG 04/03/00--AHI 28.hr  . DVT (deep venous thrombosis) 2003    left leg  . Vaginal atrophy   . Hx of colonic polyps   . Acute diastolic heart failure   . Secondary pulmonary hypertension 06/09/2013   Past Surgical History:  Past Surgical History  Procedure Laterality Date  . Lumbar spine surgery    . Cholecystectomy    . Hemorroidectomy    . Repair wound fistula    . Right shoulder    . Elbow surgery    . Dilation and curettage of uterus    . Breast surgery  1998    lumpectomy  . Oophorectomy  1959    BSO-? ovarian cancer   HPI:  79 year old female admitted 07/15/13 due to SOB, cough and  weakness.  Pt had been DC'd on 12/3 (following stay for PNA, N/V)  but was readmitted same day.  PMH significant for CAD, CHF, AFib,  HTN, OSA, COPD, DM, GERD, recurrent PNA.  BSE requested to  evaluate swallow function and safety.     Assessment / Plan / Recommendation Clinical Impression  Dysphagia Diagnosis: Mild oral phase dysphagia;Mild pharyngeal  phase dysphagia Clinical impression: Patient presents with mild oropharyngeal  dysphagia characterized by swallow initiation delay to vallecular  sinus,mild vallecular and posterior pharynx residuals, which  cleared with second swallow, and flash penetration of thin  liquids, which fully cleared without further incident. Patient  exhibited decreased anterior to posterior transit of solid  texture bolus. No aspiration observed during this MBSS.    Treatment Recommendation       Diet Recommendation Dysphagia 3 (  Mechanical Soft);Thin liquid    Liquid Administration via: Cup;Straw Medication Administration: Whole meds with liquid Supervision: Patient able to self feed;Intermittent supervision  to cue for compensatory strategies Compensations: Slow rate;Small sips/bites;Follow solids with  liquid    Other  Recommendations Oral Care Recommendations: Oral care BID   Follow Up Recommendations  Inpatient Rehab    Frequency and Duration min 2x/week  1 week   Pertinent Vitals/Pain     SLP Swallow Goals   Patient will utilize recommended strategies during swallow to  increase swallowing safety with modified independence. Patient will tolerate trials of upgraded solids with no overt s/s  aspiration.    General Date of Onset: 07/15/13 HPI: 79 year old female admitted 07/15/13 due to SOB, cough and  weakness.  Pt had been DC'd on 12/3 (following stay for PNA, N/V)  but was readmitted same day.  PMH significant for CAD, CHF, AFib,  HTN, OSA, COPD, DM, GERD, recurrent PNA.  BSE requested to  evaluate swallow function and safety. Type of Study: Modified Barium Swallowing Study Reason for Referral: Objectively evaluate swallowing function Previous Swallow Assessment: BSE Diet Prior to this Study: Dysphagia 3 (soft);Thin liquids Temperature Spikes Noted: No History of Recent Intubation: No Behavior/Cognition: Alert;Cooperative;Pleasant mood Oral Cavity - Dentition: Adequate natural dentition Self-Feeding Abilities: Able to feed self Patient Positioning: Upright in chair Baseline Vocal Quality: Clear Volitional Cough: Strong Volitional Swallow: Able to elicit    Reason for Referral Objectively evaluate swallowing function   Oral Phase Oral Preparation/Oral Phase Oral Phase: Impaired Oral - Solids Oral - Regular: Delayed oral transit Oral Phase - Comment Oral Phase - Comment: delayed anterior to posterior transit of  regular solid bolus   Pharyngeal Phase Pharyngeal Phase Pharyngeal Phase: Impaired Pharyngeal - Thin Pharyngeal - Thin Cup: Penetration/Aspiration during  swallow Penetration/Aspiration details (thin cup): Material enters  airway, remains ABOVE vocal cords then ejected out;Material  enters airway, remains ABOVE vocal cords and not ejected out Pharyngeal - Thin Straw: Penetration/Aspiration during swallow Pharyngeal - Solids Pharyngeal - Puree: Pharyngeal residue - valleculae (mild, clears  with second swallow) Pharyngeal - Regular: Delayed swallow initiation;Pharyngeal  residue - valleculae;Pharyngeal residue - posterior pharnyx Pharyngeal - Pill: Pharyngeal residue - valleculae Pharyngeal Phase - Comment Pharyngeal Comment: Pharyngeal residuals cleared with cued and  uncued second swallow performed by patient  Cervical Esophageal Phase    GO    Cervical Esophageal Phase Cervical Esophageal Phase: [name]         [name] 07/17/2013, 6:07 PM  Angela Nevin, MA, CCC-SLP [facility] Speech-Language Pathologist   ASSESSMENT AND PLAN ASSESSMENT: AFib, rapid ventricular response; pneumonia, anemia, renal insufficiency  PLAN: AFib: COntinue Cardizem. Tolerating addition of metoprolol. Continue current dose. Acute on chronic diastolic heart failure. Continue Lasix as tolerated by renal function. Renal function improved. ABx for pneumonia.  Anemia is exacerbating rapid ventricular response.  Chronic illness. Issues becoming more problematic.    Signed, Cassell Clement  MD

## 2013-07-18 NOTE — Progress Notes (Addendum)
[facility] Progress Note [place] TEAM 1 - Stepdown ICU Team   [name] ZOX:[id number] DOB: 07-23-1934 DOA: 07/15/2013 PCP: Gweneth Dimitri, MD  Brief narrative: 79 year old female patient with oxygen-dependent COPD, atrial fib, CHF and diabetes. Readmitted the same day after being discharged from the hospital- was hospitalized for acute renal failure from nausea (due to amiodarone) and diarrhea. She returned home via private vehicle and was unable to get out from the car due to complaints of severe weakness and shortness of breath. Eventually she was able to get out of the car but was weak and short of breath. The family was unable to get the patient back in the car.  EMS was called.  Upon arrival to the ER patient was found to be tachypneic and hypoxic on room air. On exam she had diminished air movement bilaterally. Chest x-ray demonstrated pneumonia with also superimposed mild pulmonary edema. Her symptoms did not improve on nasal cannula oxygen nor with nonrebreather mask and due to increased tachypnea and work of breathing she was placed on BiPAP. She was given a dose of Lasix in the ER but then her blood pressure dropped. She was empirically started on vancomycin and aztreonam for hospital-acquired pneumonia. She was also found to be hyperkalemic without any EKG changes.  Assessment/Plan: Active Problems: Acute and chronic respiratory failure with hypoxia   A) Acute on chronic grade 2 diastolic heart failure - cardiology following and directing diuresis   B) HCAP (healthcare-associated pneumonia) - R > L multifocal infiltrates -has weaned down to 3L (home is 2L) -cont empiric anbx's - added Flagyl due to concerns of ? aspiration etiology -SLP eval- MBS completed- Dysphagia 3 thin liquids    COPD with ? acute exacerbation -not wheezing - doubt exacerbation therefore have tapered steroids- pt continues to have a stable pulm status    Hyperkalemia -does not appear  was on exogenous KCL at home -K improved  Anemia - currently at baseline - cont to follow - no overt bleeding noted.     HYPOTHYROIDISM -Synthroid -Thyroid labs obtained on the 11th revealed a TSH of 2.9 and a free T4 of 1.35   Atrial fibrillation - rate difficult to control in the past- Amiodarone was effective in controlling rate but has not been able to tolerate Amiodarone due to severe nausea -Deemed not a candidate for TEE cardioversion and/or ablation unless she takes anticoagulation and per recent discussion with Dr. Eldridge Dace on 07/15/2013 patient did not want to begin Coumadin -Recommendations on 12/03 from cardiology were to consult EP -Continue Plavix - cardiology managing    DM -CBG elevated -increase Lantus as sugars uncontrolled   Deconditioning -PT/OT eval- may need CIR vs SNF prior to going back home   DVT prophylaxis: Subcutaneous heparin Code Status: Full Family Communication: Patient's brother Disposition Plan/Expected LOS: home vs snf or CIR Isolation: None Nutritional Status:  protein calorie malnutrition related to acute pulmonary illness  Consultants: Cardiology  Antibiotics: Azactam 12/03 >>> Flagyl 12/04 >>> Vancomycin 12/03 >>>  HPI/Subjective: Alert- concerned about Colchicine being decreased to 0.3 mg- I advised her that it is the recommended dose for her level of renal function- explained also that I have added Allopurinol. No other complaints.   Objective: Blood pressure 141/58, pulse 96, temperature 98 F (36.7 C), temperature source Oral, resp. rate 25, height 5' (1.524 m), weight 92.6 kg (204 lb 2.3 oz), SpO2 95.00%.  Intake/Output Summary (Last 24 hours) at 07/18/13 1326 Last data filed at 07/18/13 0900  Gross  per 24 hour  Intake    760 ml  Output   1750 ml  Net   -990 ml     Exam: General: No acute respiratory distress Lungs: Clear to auscultation bilaterally without wheezes or crackles, 3L Cardiovascular: Irregular rate  and rhythm without murmur gallop or rub normal S1 and S2, no peripheral edema or JVD- HR in 110-120 range at rest  Abdomen: Nontender, nondistended, soft, bowel sounds positive, no rebound, no ascites, no appreciable mass Musculoskeletal: No significant cyanosis, clubbing of bilateral lower extremities Neurological: Alert and oriented x 3, moves all extremities x 4 without focal neurological deficits but noticed with generalized symmetrical weakness, CN 2-12 intact  Scheduled Meds:  Scheduled Meds: . ipratropium  0.5 mg Nebulization TID   And  . albuterol  2.5 mg Nebulization TID  . allopurinol  100 mg Oral Daily  . aztreonam  1 g Intravenous Q8H  . budesonide  0.25 mg Nebulization BID  . clopidogrel  75 mg Oral Daily  . colchicine  0.3 mg Oral BID  . cyclobenzaprine  10 mg Oral Daily  . diltiazem  360 mg Oral Daily  . feeding supplement (GLUCERNA SHAKE)  237 mL Oral TID BM  . glimepiride  4 mg Oral Q breakfast  . heparin  5,000 Units Subcutaneous Q8H  . insulin aspart  0-15 Units Subcutaneous TID WC  . insulin aspart  0-5 Units Subcutaneous QHS  . insulin glargine  10 Units Subcutaneous Daily  . insulin glargine  15 Units Subcutaneous QHS  . isosorbide mononitrate  15 mg Oral Daily  . levothyroxine  25 mcg Oral Custom  . levothyroxine  50 mcg Oral Custom  . metoprolol tartrate  25 mg Oral BID  . metroNIDAZOLE  500 mg Oral Q8H  . oxybutynin  10 mg Oral Daily  . OxyCODONE  20 mg Oral Q12H  . predniSONE  40 mg Oral Q breakfast  . ramelteon  8 mg Oral QHS  . vancomycin  1,250 mg Intravenous Q24H   Continuous Infusions:   **Reviewed in detail by the Attending Physician  Data Reviewed: Basic Metabolic Panel:  Recent Labs Lab 07/11/13 1550  07/12/13 0410 07/13/13 0606  07/16/13 0335 07/16/13 1145 07/16/13 1745 07/17/13 0420 07/18/13 0510  NA  --   < > 137 138  < > 133* 134* 134* 137 133*  K 4.0  --  3.9 4.3  < > 5.7* 5.4* 5.7* 4.8 4.5  CL  --   < > 100 102  < > 98 98  98 101 99  CO2  --   < > 28 26  < > 21 15* 15* 17* 24  GLUCOSE  --   < > 125* 145*  < > 174* 224* 265* 286* 312*  BUN  --   < > 76* 50*  < > 49* 50* 56* 57* 57*  CREATININE  --   < > 2.56* 1.53*  < > 1.80* 1.83* 1.86* 1.82* 1.52*  CALCIUM  --   < > 7.2* 7.8*  < > 7.8* 8.0* 8.0* 7.7* 7.5*  MG 1.7  --  1.7  --   --   --   --   --   --   --   PHOS  --   --  4.2 3.1  --   --   --   --   --   --   < > = values in this interval not displayed. Liver Function Tests:  Recent  Labs Lab 07/12/13 0410 07/13/13 0606 07/16/13 0200 07/16/13 0335  AST  --   --  184* 170*  ALT  --   --  116* 116*  ALKPHOS  --   --  112 113  BILITOT  --   --  0.4 0.3  PROT  --   --  5.7* 5.8*  ALBUMIN 2.4* 2.4* 2.3* 2.2*   No results found for this basename: LIPASE, AMYLASE,  in the last 168 hours No results found for this basename: AMMONIA,  in the last 168 hours CBC:  Recent Labs Lab 07/12/13 0410 07/15/13 1909 07/15/13 2301 07/16/13 0335 07/17/13 0420 07/18/13 0510  WBC 5.2 12.5*  --  11.4* 9.4 8.6  NEUTROABS  --  11.0*  --  10.2*  --   --   HGB 8.9* 8.8* 8.5* 8.3* 7.6* 8.0*  HCT 25.7* 27.4* 25.0* 25.1* 23.3* 24.4*  MCV 86.0 90.7  --  88.1 88.6 88.1  PLT 213 268  --  258 287 317   Cardiac Enzymes:  Recent Labs Lab 07/16/13 0200  TROPONINI <0.30   BNP (last 3 results)  Recent Labs  06/07/13 1300 06/13/13 0406 07/15/13 2101  PROBNP 3554.0* 1503.0* 31366.0*   CBG:  Recent Labs Lab 07/17/13 0804 07/17/13 1252 07/17/13 1608 07/17/13 2220 07/18/13 1245  GLUCAP 269* 244* 308* 286* 315*    Recent Results (from the past 240 hour(s))  CULTURE, BLOOD (ROUTINE X 2)     Status: None   Collection Time    07/09/13  1:21 AM      Result Value Range Status   Specimen Description BLOOD RIGHT ARM   Final   Special Requests BOTTLES DRAWN AEROBIC AND ANAEROBIC 10CC EACH   Final   Culture  Setup Time     Final   Value: 07/10/2013 08:57     Performed at [facility]   Culture      Final   Value: NO GROWTH 5 DAYS     Performed at [facility]   Report Status 07/16/2013 FINAL   Final  CLOSTRIDIUM DIFFICILE BY PCR     Status: None   Collection Time    07/09/13  4:16 PM      Result Value Range Status   C difficile by pcr NEGATIVE  NEGATIVE Final  URINE CULTURE     Status: None   Collection Time    07/09/13 10:40 PM      Result Value Range Status   Specimen Description URINE, CATHETERIZED   Final   Special Requests NONE   Final   Culture  Setup Time     Final   Value: 07/10/2013 09:06     Performed at [facility] Count     Final   Value: NO GROWTH     Performed at [facility]   Culture     Final   Value: NO GROWTH     Performed at [facility]   Report Status 07/11/2013 FINAL   Final  MRSA PCR SCREENING     Status: None   Collection Time    07/09/13 10:40 PM      Result Value Range Status   MRSA by PCR NEGATIVE  NEGATIVE Final   Comment:            The GeneXpert MRSA Assay (FDA     approved for NASAL specimens     only), is one component of a  comprehensive MRSA colonization     surveillance program. It is not     intended to diagnose MRSA     infection nor to guide or     monitor treatment for     MRSA infections.  CULTURE, BLOOD (ROUTINE X 2)     Status: None   Collection Time    07/10/13  1:14 AM      Result Value Range Status   Specimen Description BLOOD LEFT HAND   Final   Special Requests BOTTLES DRAWN AEROBIC AND ANAEROBIC 10CC EACH   Final   Culture  Setup Time     Final   Value: 07/10/2013 08:57     Performed at [facility]   Culture     Final   Value: NO GROWTH 5 DAYS     Performed at [facility]   Report Status 07/16/2013 FINAL   Final  CULTURE, BLOOD (ROUTINE X 2)     Status: None   Collection Time    07/15/13  8:34 PM      Result Value Range Status   Specimen Description BLOOD ARM RIGHT   Final   Special Requests BOTTLES DRAWN AEROBIC ONLY 3CC   Final   Culture   Setup Time     Final   Value: 07/16/2013 01:45     Performed at [facility]   Culture     Final   Value:        BLOOD CULTURE RECEIVED NO GROWTH TO DATE CULTURE WILL BE HELD FOR 5 DAYS BEFORE ISSUING A FINAL NEGATIVE REPORT     Performed at [facility]   Report Status PENDING   Incomplete  CULTURE, BLOOD (ROUTINE X 2)     Status: None   Collection Time    07/15/13  8:45 PM      Result Value Range Status   Specimen Description BLOOD ARM LEFT   Final   Special Requests BOTTLES DRAWN AEROBIC ONLY 10CC   Final   Culture  Setup Time     Final   Value: 07/16/2013 01:45     Performed at [facility]   Culture     Final   Value:        BLOOD CULTURE RECEIVED NO GROWTH TO DATE CULTURE WILL BE HELD FOR 5 DAYS BEFORE ISSUING A FINAL NEGATIVE REPORT     Performed at [facility]   Report Status PENDING   Incomplete     Studies:  Recent x-ray studies have been reviewed in detail by the Attending Physician   Christoph Copelan,MD [phone] 07/18/2013, 1:26 PM

## 2013-07-19 ENCOUNTER — Inpatient Hospital Stay (HOSPITAL_COMMUNITY): Payer: Medicare Other

## 2013-07-19 DIAGNOSIS — I5031 Acute diastolic (congestive) heart failure: Secondary | ICD-10-CM

## 2013-07-19 LAB — GLUCOSE, CAPILLARY
Glucose-Capillary: 277 mg/dL — ABNORMAL HIGH (ref 70–99)
Glucose-Capillary: 183 mg/dL — ABNORMAL HIGH (ref 70–99)
Glucose-Capillary: 54 mg/dL — ABNORMAL LOW (ref 70–99)
Glucose-Capillary: 123 mg/dL — ABNORMAL HIGH (ref 70–99)

## 2013-07-19 LAB — CBC
Platelets: 337 10*3/uL (ref 150–400)
RBC: 3.03 MIL/uL — ABNORMAL LOW (ref 3.87–5.11)
RDW: 19.7 % — ABNORMAL HIGH (ref 11.5–15.5)
WBC: 11.7 10*3/uL — ABNORMAL HIGH (ref 4.0–10.5)

## 2013-07-19 LAB — BASIC METABOLIC PANEL
CO2: 23 mEq/L (ref 19–32)
Chloride: 102 mEq/L (ref 96–112)
Creatinine, Ser: 1.17 mg/dL — ABNORMAL HIGH (ref 0.50–1.10)
Glucose, Bld: 322 mg/dL — ABNORMAL HIGH (ref 70–99)

## 2013-07-19 MED ORDER — INSULIN GLARGINE 100 UNIT/ML ~~LOC~~ SOLN
12.0000 [IU] | Freq: Every day | SUBCUTANEOUS | Status: DC
  Administered 2013-07-19: 12 [IU] via SUBCUTANEOUS
  Filled 2013-07-19 (×2): qty 0.12

## 2013-07-19 MED ORDER — INSULIN GLARGINE 100 UNIT/ML ~~LOC~~ SOLN
15.0000 [IU] | Freq: Every day | SUBCUTANEOUS | Status: DC
  Filled 2013-07-19: qty 0.15

## 2013-07-19 MED ORDER — FUROSEMIDE 40 MG PO TABS
40.0000 mg | ORAL_TABLET | Freq: Two times a day (BID) | ORAL | Status: DC
  Administered 2013-07-19 – 2013-07-22 (×8): 40 mg via ORAL
  Filled 2013-07-19 (×9): qty 1

## 2013-07-19 MED ORDER — METRONIDAZOLE 500 MG PO TABS
500.0000 mg | ORAL_TABLET | Freq: Three times a day (TID) | ORAL | Status: DC
  Administered 2013-07-19 – 2013-07-22 (×10): 500 mg via ORAL
  Filled 2013-07-19 (×13): qty 1

## 2013-07-19 MED ORDER — INSULIN GLARGINE 100 UNIT/ML ~~LOC~~ SOLN
10.0000 [IU] | Freq: Every day | SUBCUTANEOUS | Status: DC
  Filled 2013-07-19: qty 0.1

## 2013-07-19 MED ORDER — FLEET ENEMA 7-19 GM/118ML RE ENEM
1.0000 | ENEMA | Freq: Once | RECTAL | Status: AC
  Administered 2013-07-19: 09:00:00 via RECTAL
  Filled 2013-07-19: qty 1

## 2013-07-19 MED ORDER — PREDNISONE 20 MG PO TABS
20.0000 mg | ORAL_TABLET | Freq: Every day | ORAL | Status: DC
  Filled 2013-07-19: qty 1

## 2013-07-19 NOTE — Clinical Social Work Psychosocial (Signed)
Clinical Social Work Department BRIEF PSYCHOSOCIAL ASSESSMENT 07/19/2013  Patient:  DEBORH, PENSE     Account Number:  [id number]     Admit date:  07/15/2013  Clinical Social Worker:  Lavell Luster  Date/Time:  07/19/2013 01:36 PM  Referred by:  Physician  Date Referred:  07/19/2013 Referred for  SNF Placement   Other Referral:   Interview type:  Patient Other interview type:   Patient alert and oriented at time of assessment.    PSYCHOSOCIAL DATA Living Status:  HUSBAND Admitted from facility:   Level of care:   Primary support name:  Severa Jeremiah [phone] Primary support relationship to patient:  SPOUSE Degree of support available:   Support is strong.    CURRENT CONCERNS Current Concerns  Post-Acute Placement   Other Concerns:    SOCIAL WORK ASSESSMENT / PLAN CSW met with patient at bedside to discuss recommendation for SNF care. Patient reports that she had a recent stay at [facility]. Patient was discharged home and was then brough back to the emergency room after becoming SOB. Patient states that she wants to return to [facility] for rehab and seemed adamant about making sure this happened. CSW explained that this cannot be guaranteed but that the appropriate information will be faxed to [facility]. Patient admitted from home where she lives with husband Iantha Fallen and her sister in-law Malena Peer. Patient plans to return home after SNF stay.   Assessment/plan status:  Psychosocial Support/Ongoing Assessment of Needs Other assessment/ plan:   Complete FL2, Fax   Information/referral to community resources:   CSW contact information and SNF list given to patient.    PATIENT'S/FAMILY'S RESPONSE TO PLAN OF CARE: Patient is agreeable to SNF placement for rehab. Patient was pleasant, appropriate, and appreciative of CSW contact. Patient awaits bed offers. CSW will continue to follow.     Roddie Mc, [place], [place], [phone]

## 2013-07-19 NOTE — Progress Notes (Signed)
Hypoglycemic Event  CBG: 54  Treatment: 15 gm carbs  Symptoms: none  Follow-up CBG: Time:1805 CBG Result:84  Possible Reasons for Event: poor oral intake for lunch,prednisone discontinued  Comments/MD notified:Dr Rizwan    [name], [name]  Remember to initiate Hypoglycemia Order Set & complete

## 2013-07-19 NOTE — Progress Notes (Signed)
[facility] Progress Note [place] TEAM 1 - Stepdown ICU Team   [name] ZOX:[id number] DOB: 1934/06/10 DOA: 07/15/2013 PCP: Gweneth Dimitri, MD  Brief narrative: 79 year old female patient with oxygen-dependent COPD, atrial fib, CHF and diabetes. Readmitted the same day after being discharged from the hospital- was hospitalized for acute renal failure from nausea (due to amiodarone) and diarrhea. She returned home via private vehicle and was unable to get out from the car due to complaints of severe weakness and shortness of breath. Eventually she was able to get out of the car but was weak and short of breath. The family was unable to get the patient back in the car.  EMS was called.  Upon arrival to the ER patient was found to be tachypneic and hypoxic on room air. On exam she had diminished air movement bilaterally. Chest x-ray demonstrated pneumonia with also superimposed mild pulmonary edema. Her symptoms did not improve on nasal cannula oxygen nor with nonrebreather mask and due to increased tachypnea and work of breathing she was placed on BiPAP. She was given a dose of Lasix in the ER but became hypotensive. She was empirically started on vancomycin and aztreonam for hospital-acquired pneumonia. She was also found to be hyperkalemic without any EKG changes.  Assessment/Plan: Active Problems: Acute and chronic respiratory failure with hypoxia   A) Acute on chronic grade 2 diastolic heart failure - over diuresed with noted rise in Creatinine and therefore Lasix d/c'd -resumed lasix yesterday- pt states she takes 40 BID at home-    B) HCAP R > L multifocal infiltrates -has weaned down to 3L (home is 2L) -cont empiric anbx's x 7-10 days - added Flagyl due to concerns of ? aspiration etiology -SLP eval- MBS completed- Dysphagia 3 thin liquids   C) COPD with acute exacerbation? - doubt exacerbation therefore rapidly tapering steroids - has received 4 days of IV/oral  steroids - d/c prednisone today and follow clinically over 24 hr   Atrial fibrillation with RVR - Cardizem and Lopressor per cardiology - rate difficult to control in the past- Amiodarone was effective in controlling rate but has not been able to tolerate Amiodarone due to severe nausea -Deemed not a candidate for TEE cardioversion and/or ablation unless she takes anticoagulation and per recent discussion with Dr. Eldridge Dace on 07/15/2013 patient did not want to begin Coumadin -Continue Plavix    DM -CBG elevated due to steroids - cont insulin and Amaryl -cont to increase Lantus as sugars uncontrolled  - follow for hypoglycemia as steroids being discontinued today    Hyperkalemia -does not appear was on exogenous KCL at home- resolved  Anemia - currently at baseline - cont to follow - no overt bleeding noted.     HYPOTHYROIDISM -cont Synthroid -11/30-  TSH of 2.9 and a free T4 of 1.35   Deconditioning -PT/OT eval-will need CIR vs SNF    DVT prophylaxis: Subcutaneous heparin Code Status: Full Family Communication: none today Disposition Plan/Expected LOS: home vs snf or CIR- will request CIR eval as recommended per PT Nutritional Status:  protein calorie malnutrition related to acute pulmonary illness  Consultants: Cardiology  Antibiotics: Azactam 12/03 >>> Flagyl 12/04 >>> Vancomycin 12/03 >>>  HPI/Subjective: Alert- no complaints   Objective: Blood pressure 124/67, pulse 95, temperature 97.7 F (36.5 C), temperature source Oral, resp. rate 22, height 5' (1.524 m), weight 90.8 kg (200 lb 2.8 oz), SpO2 95.00%.  Intake/Output Summary (Last 24 hours) at 07/19/13 0814 Last data filed at 07/19/13 [phone]  Gross per 24 hour  Intake    880 ml  Output   2050 ml  Net  -1170 ml     Exam: General: No acute respiratory distress Lungs: Clear to auscultation bilaterally without wheezes or crackles, 3L- 97% Cardiovascular: Irregular rate and rhythm without murmur gallop or  rub normal S1 and S2, no peripheral edema or JVD- HR in 90-110 range at rest  Abdomen: Nontender, nondistended, soft, bowel sounds positive, no rebound, no ascites, no appreciable mass Musculoskeletal: No significant cyanosis, clubbing of bilateral lower extremities Neurological: Alert and oriented x 3, moves all extremities x 4 without focal neurological deficits but noticed with generalized symmetrical weakness, CN 2-12 intact  Scheduled Meds:  Scheduled Meds: . ipratropium  0.5 mg Nebulization TID   And  . albuterol  2.5 mg Nebulization TID  . allopurinol  100 mg Oral Daily  . aztreonam  1 g Intravenous Q8H  . budesonide  0.25 mg Nebulization BID  . clopidogrel  75 mg Oral Daily  . colchicine  0.3 mg Oral BID  . cyclobenzaprine  10 mg Oral Daily  . diltiazem  360 mg Oral Daily  . feeding supplement (GLUCERNA SHAKE)  237 mL Oral TID BM  . furosemide  40 mg Oral BID  . glimepiride  4 mg Oral Q breakfast  . heparin  5,000 Units Subcutaneous Q8H  . insulin aspart  0-15 Units Subcutaneous TID WC  . insulin aspart  0-5 Units Subcutaneous QHS  . insulin glargine  10 Units Subcutaneous Daily  . insulin glargine  20 Units Subcutaneous QHS  . isosorbide mononitrate  15 mg Oral Daily  . levothyroxine  25 mcg Oral Custom  . levothyroxine  50 mcg Oral Custom  . metoprolol tartrate  25 mg Oral BID  . metroNIDAZOLE  500 mg Oral Q8H  . oxybutynin  10 mg Oral Daily  . OxyCODONE  20 mg Oral Q12H  . [START ON 07/20/2013] predniSONE  20 mg Oral Q breakfast  . ramelteon  8 mg Oral QHS  . vancomycin  1,250 mg Intravenous Q24H   Continuous Infusions:   **Reviewed in detail by the Attending Physician  Data Reviewed: Basic Metabolic Panel:  Recent Labs Lab 07/13/13 0606  07/16/13 1145 07/16/13 1745 07/17/13 0420 07/18/13 0510 07/19/13 0500  NA 138  < > 134* 134* 137 133* 137  K 4.3  < > 5.4* 5.7* 4.8 4.5 4.9  CL 102  < > 98 98 101 99 102  CO2 26  < > 15* 15* 17* 24 23  GLUCOSE 145*  <  > 224* 265* 286* 312* 322*  BUN 50*  < > 50* 56* 57* 57* 44*  CREATININE 1.53*  < > 1.83* 1.86* 1.82* 1.52* 1.17*  CALCIUM 7.8*  < > 8.0* 8.0* 7.7* 7.5* 7.7*  PHOS 3.1  --   --   --   --   --   --   < > = values in this interval not displayed. Liver Function Tests:  Recent Labs Lab 07/13/13 0606 07/16/13 0200 07/16/13 0335  AST  --  184* 170*  ALT  --  116* 116*  ALKPHOS  --  112 113  BILITOT  --  0.4 0.3  PROT  --  5.7* 5.8*  ALBUMIN 2.4* 2.3* 2.2*   No results found for this basename: LIPASE, AMYLASE,  in the last 168 hours No results found for this basename: AMMONIA,  in the last 168 hours CBC:  Recent Labs Lab  07/15/13 1909 07/15/13 2301 07/16/13 0335 07/17/13 0420 07/18/13 0510  WBC 12.5*  --  11.4* 9.4 8.6  NEUTROABS 11.0*  --  10.2*  --   --   HGB 8.8* 8.5* 8.3* 7.6* 8.0*  HCT 27.4* 25.0* 25.1* 23.3* 24.4*  MCV 90.7  --  88.1 88.6 88.1  PLT 268  --  258 287 317   Cardiac Enzymes:  Recent Labs Lab 07/16/13 0200  TROPONINI <0.30   BNP (last 3 results)  Recent Labs  06/07/13 1300 06/13/13 0406 07/15/13 2101  PROBNP 3554.0* 1503.0* 31366.0*   CBG:  Recent Labs Lab 07/18/13 0835 07/18/13 1245 07/18/13 1720 07/18/13 2133 07/19/13 0753  GLUCAP 294* 315* 288* 299* 277*       Studies:  Recent x-ray studies have been reviewed in detail by the Attending Physician   Ajene Carchi,MD [phone] 07/19/2013, 8:14 AM

## 2013-07-19 NOTE — Clinical Social Work Placement (Addendum)
Clinical Social Work Department CLINICAL SOCIAL WORK PLACEMENT NOTE 07/19/2013  Patient:  [name], [name]  Account Number:  [id number] Admit date:  07/15/2013  Clinical Social Worker:  Cherre Blanc, [place]  Date/time:  07/19/2013 02:00 PM  Clinical Social Work is seeking post-discharge placement for this patient at the following level of care:   SKILLED NURSING   (*CSW will update this form in Epic as items are completed)   07/19/2013  Patient/family provided with [facility] Department of Clinical Social Work's list of facilities offering this level of care within the geographic area requested by the patient (or if unable, by the patient's family).  07/19/2013  Patient/family informed of their freedom to choose among providers that offer the needed level of care, that participate in Medicare, Medicaid or managed care program needed by the patient, have an available bed and are willing to accept the patient.  07/19/2013  Patient/family informed of [facility]' ownership interest in [facility], as well as of the fact that they are under no obligation to receive care at this facility.  PASARR submitted to EDS on 06/16/13 PASARR number received from [employer] on 06/16/2013 - [phone] [id number]  FL2 transmitted to all facilities in geographic area requested by pt/family on  07/19/2013 FL2 transmitted to all facilities within larger geographic area on   Patient informed that his/her managed care company has contracts with or will negotiate with  certain facilities, including the following:    Patient/family informed of bed offers received: 07/20/13  Patient chooses bed at [facility] Physician recommends and patient chooses bed at    Patient to be transferred to [facility] on  07/22/13 Patient to be transferred to facility by ambulance  The following physician request were entered in Epic:   Additional Comments:  Roddie Mc, [place], [phone]

## 2013-07-19 NOTE — Progress Notes (Signed)
Patient called the operator with concerns that she would not like to moved out of the this floor.Patient having a lot of anxiety at this time.Reassured  And offered ativan as ordered PRN. Will continue to reassure and monitor patient and family concerns.

## 2013-07-19 NOTE — Progress Notes (Signed)
Steroids dc'd today. Pt had hypoglycemic event prior to shift change. CBG 84 at shift change. Day shift RN stated to call NP on call if CBG <200 this evening regarding HS lantus dose. CBG 123. NP notified. Orders to hold lantus 20 units this pm. Will continue to monitor.    M.Foster Simpson, RN

## 2013-07-19 NOTE — Progress Notes (Signed)
Patient refuses CPAP tonight.  Patient is in no distress.  RT will continue to monitor.

## 2013-07-19 NOTE — Progress Notes (Signed)
Patient Name: [name] Date of Encounter: 07/19/2013     Active Problems:   HYPOTHYROIDISM   DM   COPD with acute exacerbation   Acute on chronic diastolic heart failure   HCAP (healthcare-associated pneumonia)   Acute and chronic respiratory failure with hypoxia   Hyperkalemia    SUBJECTIVE Atrial fib is better controlled today. She is breathing better after coughing up some dark sputum.  CURRENT MEDS . ipratropium  0.5 mg Nebulization TID   And  . albuterol  2.5 mg Nebulization TID  . allopurinol  100 mg Oral Daily  . aztreonam  1 g Intravenous Q8H  . budesonide  0.25 mg Nebulization BID  . clopidogrel  75 mg Oral Daily  . colchicine  0.3 mg Oral BID  . cyclobenzaprine  10 mg Oral Daily  . diltiazem  360 mg Oral Daily  . feeding supplement (GLUCERNA SHAKE)  237 mL Oral TID BM  . furosemide  40 mg Oral BID  . glimepiride  4 mg Oral Q breakfast  . heparin  5,000 Units Subcutaneous Q8H  . insulin aspart  0-15 Units Subcutaneous TID WC  . insulin aspart  0-5 Units Subcutaneous QHS  . insulin glargine  12 Units Subcutaneous Daily  . insulin glargine  20 Units Subcutaneous QHS  . isosorbide mononitrate  15 mg Oral Daily  . levothyroxine  25 mcg Oral Custom  . levothyroxine  50 mcg Oral Custom  . metoprolol tartrate  25 mg Oral BID  . metroNIDAZOLE  500 mg Oral Q8H  . oxybutynin  10 mg Oral Daily  . OxyCODONE  20 mg Oral Q12H  . ramelteon  8 mg Oral QHS  . vancomycin  1,250 mg Intravenous Q24H    OBJECTIVE  Filed Vitals:   07/19/13 0447 07/19/13 0500 07/19/13 0751 07/19/13 0755  BP:   124/67   Pulse:   95   Temp:   97.7 F (36.5 C)   TempSrc:   Oral   Resp:   22   Height:      Weight:  200 lb 2.8 oz (90.8 kg)    SpO2: 96%  95% 95%    Intake/Output Summary (Last 24 hours) at 07/19/13 1056 Last data filed at 07/19/13 0548  Gross per 24 hour  Intake    640 ml  Output   2050 ml  Net  -1410 ml   Filed Weights   07/16/13 0600 07/18/13 0445  07/19/13 0500  Weight: 195 lb 5.2 oz (88.6 kg) 204 lb 2.3 oz (92.6 kg) 200 lb 2.8 oz (90.8 kg)    PHYSICAL EXAM  General: Pleasant, NAD. Neuro: Alert and oriented X 3. Moves all extremities spontaneously. Psych: Normal affect. HEENT:  Normal  Neck: Supple without bruits or JVD. Lungs:  Bilateral basilar rales. Heart: RRR no s3, s4, or murmurs. Abdomen: Soft, non-tender, non-distended, BS + x 4.  Extremities: No clubbing, cyanosis or edema. DP/PT/Radials 2+ and equal bilaterally.  Accessory Clinical Findings  CBC  Recent Labs  07/18/13 0510 07/19/13 0754  WBC 8.6 11.7*  HGB 8.0* 8.7*  HCT 24.4* 27.0*  MCV 88.1 89.1  PLT 317 337   Basic Metabolic Panel  Recent Labs  07/18/13 0510 07/19/13 0500  NA 133* 137  K 4.5 4.9  CL 99 102  CO2 24 23  GLUCOSE 312* 322*  BUN 57* 44*  CREATININE 1.52* 1.17*  CALCIUM 7.5* 7.7*   Liver Function Tests No results found for this basename: AST, ALT,  ALKPHOS, BILITOT, PROT, ALBUMIN,  in the last 72 hours No results found for this basename: LIPASE, AMYLASE,  in the last 72 hours Cardiac Enzymes No results found for this basename: CKTOTAL, CKMB, CKMBINDEX, TROPONINI,  in the last 72 hours BNP No components found with this basename: POCBNP,  D-Dimer No results found for this basename: DDIMER,  in the last 72 hours Hemoglobin A1C No results found for this basename: HGBA1C,  in the last 72 hours Fasting Lipid Panel No results found for this basename: CHOL, HDL, LDLCALC, TRIG, CHOLHDL, LDLDIRECT,  in the last 72 hours Thyroid Function Tests No results found for this basename: TSH, T4TOTAL, FREET3, T3FREE, THYROIDAB,  in the last 72 hours  TELE  Atrial fib with rate 90-110  ECG    Radiology/Studies  Ct Abdomen Pelvis Wo Contrast  07/09/2013   CLINICAL DATA:  Abdominal pain. Nausea. Diarrhea. Acute renal failure.  EXAM: CT ABDOMEN AND PELVIS WITHOUT CONTRAST  TECHNIQUE: Multidetector CT imaging of the abdomen and pelvis was  performed following the standard protocol without intravenous contrast.  COMPARISON:  05/20/2007  FINDINGS: Surgical clips noted from prior cholecystectomy. Noncontrast images of the liver, spleen, pancreas, and adrenal glands are normal in appearance. No evidence of renal calculi or hydronephrosis.  No soft tissue masses or lymphadenopathy identified within the abdomen or pelvis. Prior hysterectomy noted. Adnexal regions are unremarkable. Foley catheter seen within the bladder, and a pessary noted in the vagina. Sigmoid diverticulosis is noted, however there is no evidence of diverticulitis. No evidence of inflammatory process or abnormal fluid collections.  IMPRESSION: No acute findings.  No evidence of hydronephrosis.  Diverticulosis. No radiographic evidence of diverticulitis.   Electronically Signed   By: Myles Rosenthal M.D.   On: 07/09/2013 22:42   Dg Chest 2 View  07/09/2013   CLINICAL DATA:  Abdominal pain with nausea and weakness, recent history of pneumonia  EXAM: CHEST  2 VIEW  COMPARISON:  CT scan of the chest of June 07, 2013.  FINDINGS: The lungs are well-expanded. The posterior right lower lobe infiltrate demonstrated on the previous CT scan is not evident on this chest x-ray. The cardiac silhouette is top-normal in size. The pulmonary vascularity is not engorged. There is no pleural effusion. There is no pneumothorax. There is mild loss of height of multiple mid and lower thoracic vertebral bodies without evidence of high-grade which compression. The observed portions of the upper abdomen reveal no definite abnormal gas collection.  IMPRESSION: 1. There is no evidence of residual pneumonia in the right lower lobe. 2. There is borderline enlargement of cardiac silhouette without overt evidence of CHF.   Electronically Signed   By: David  [place]   On: 07/09/2013 16:49   Dg Chest Port 1 View  07/19/2013   CLINICAL DATA:  Hypoxia.  Shortness of breath.  EXAM: PORTABLE CHEST - 1 VIEW  COMPARISON:   07/15/2013 and 07/11/2013  FINDINGS: Small patchy infiltrates are present in the right upper lobe and at the right base but aeration is slightly improved. Slight atelectasis at the left base has improved. Heart size and pulmonary vascularity are within normal limits.  No acute osseous abnormality.  Small bilateral effusions.  IMPRESSION: Slight improvement in the patchy infiltrates on the right and atelectasis on the left.   Electronically Signed   By: Geanie Cooley M.D.   On: 07/19/2013 09:21   Dg Chest Port 1 View  07/15/2013   CLINICAL DATA:  Respiratory failure and fatigue. CHF. COPD. Diabetes.  EXAM: PORTABLE CHEST - 1 VIEW  COMPARISON:  07/11/2013  FINDINGS: Right glenohumeral joint arthroplasty. Cardiomegaly accentuated by AP portable technique. Advanced aortic atherosclerosis. Possible small pleural effusions. No pneumothorax. Patchy right upper and right lower lobe airspace disease. Minimal left base airspace disease.  IMPRESSION: Markedly worsened aeration, with right greater than left bilateral airspace disease. Favor infection or aspiration. Asymmetric pulmonary edema felt less likely.  Cardiomegaly with atherosclerosis and probable small bilateral pleural effusions.   Electronically Signed   By: Jeronimo Greaves M.D.   On: 07/15/2013 19:39   Dg Chest Port 1 View  07/11/2013   CLINICAL DATA:  Hypoxia.  EXAM: PORTABLE CHEST - 1 VIEW  COMPARISON:  07/09/2013.  FINDINGS: Right lower lobe infiltrate has cleared. Minimal basilar atelectasis present. Heart size is stable. Pulmonary vascularity is normal. Right shoulder replacement. Degenerative changes left shoulder.  IMPRESSION: Previously identified right lower lobe infiltrate has cleared. Mild basilar atelectasis.   Electronically Signed   By: Maisie Fus  Register   On: 07/11/2013 15:04   Dg Swallowing Func-speech Pathology  07/17/2013   [name], CCC-SLP     07/17/2013  6:09 PM Objective Swallowing Evaluation: Modified Barium Swallowing Study    Patient Details  Name: MANILA ROMMEL MRN: [id number] Date of Birth: Jul 13, 1934  Today's Date: 07/17/2013 Time: 0865-7846 SLP Time Calculation (min): 25 min  Past Medical History:  Past Medical History  Diagnosis Date  . CAD (coronary artery disease)   . CHF (congestive heart failure)   . Paroxysmal atrial fibrillation   . Bradycardia   . Hypertension   . Renal artery stenosis   . COPD (chronic obstructive pulmonary disease)   . Obesity hypoventilation syndrome   . Pruritus   . Tracheobronchitis   . Acute rhinosinusitis   . Anemia   . Hypoxemia   . Peripheral edema   . Obesity, endogenous   . Diabetes mellitus   . Acid reflux disease   . Osteoarthritis, generalized   . Hypothyroidism   . Lung nodule     lingula  . OSA (obstructive sleep apnea)     NPSG 04/03/00--AHI 28.hr  . DVT (deep venous thrombosis) 2003    left leg  . Vaginal atrophy   . Hx of colonic polyps   . Acute diastolic heart failure   . Secondary pulmonary hypertension 06/09/2013   Past Surgical History:  Past Surgical History  Procedure Laterality Date  . Lumbar spine surgery    . Cholecystectomy    . Hemorroidectomy    . Repair wound fistula    . Right shoulder    . Elbow surgery    . Dilation and curettage of uterus    . Breast surgery  1998    lumpectomy  . Oophorectomy  1959    BSO-? ovarian cancer   HPI:  79 year old female admitted 07/15/13 due to SOB, cough and  weakness.  Pt had been DC'd on 12/3 (following stay for PNA, N/V)  but was readmitted same day.  PMH significant for CAD, CHF, AFib,  HTN, OSA, COPD, DM, GERD, recurrent PNA.  BSE requested to  evaluate swallow function and safety.     Assessment / Plan / Recommendation Clinical Impression  Dysphagia Diagnosis: Mild oral phase dysphagia;Mild pharyngeal  phase dysphagia Clinical impression: Patient presents with mild oropharyngeal  dysphagia characterized by swallow initiation delay to vallecular  sinus,mild vallecular and posterior pharynx residuals, which  cleared with second  swallow, and flash penetration of thin  liquids, which  fully cleared without further incident. Patient  exhibited decreased anterior to posterior transit of solid  texture bolus. No aspiration observed during this MBSS.    Treatment Recommendation       Diet Recommendation Dysphagia 3 (Mechanical Soft);Thin liquid   Liquid Administration via: Cup;Straw Medication Administration: Whole meds with liquid Supervision: Patient able to self feed;Intermittent supervision  to cue for compensatory strategies Compensations: Slow rate;Small sips/bites;Follow solids with  liquid    Other  Recommendations Oral Care Recommendations: Oral care BID   Follow Up Recommendations  Inpatient Rehab    Frequency and Duration min 2x/week  1 week   Pertinent Vitals/Pain     SLP Swallow Goals   Patient will utilize recommended strategies during swallow to  increase swallowing safety with modified independence. Patient will tolerate trials of upgraded solids with no overt s/s  aspiration.    General Date of Onset: 07/15/13 HPI: 79 year old female admitted 07/15/13 due to SOB, cough and  weakness.  Pt had been DC'd on 12/3 (following stay for PNA, N/V)  but was readmitted same day.  PMH significant for CAD, CHF, AFib,  HTN, OSA, COPD, DM, GERD, recurrent PNA.  BSE requested to  evaluate swallow function and safety. Type of Study: Modified Barium Swallowing Study Reason for Referral: Objectively evaluate swallowing function Previous Swallow Assessment: BSE Diet Prior to this Study: Dysphagia 3 (soft);Thin liquids Temperature Spikes Noted: No History of Recent Intubation: No Behavior/Cognition: Alert;Cooperative;Pleasant mood Oral Cavity - Dentition: Adequate natural dentition Self-Feeding Abilities: Able to feed self Patient Positioning: Upright in chair Baseline Vocal Quality: Clear Volitional Cough: Strong Volitional Swallow: Able to elicit    Reason for Referral Objectively evaluate swallowing function   Oral Phase Oral Preparation/Oral Phase  Oral Phase: Impaired Oral - Solids Oral - Regular: Delayed oral transit Oral Phase - Comment Oral Phase - Comment: delayed anterior to posterior transit of  regular solid bolus   Pharyngeal Phase Pharyngeal Phase Pharyngeal Phase: Impaired Pharyngeal - Thin Pharyngeal - Thin Cup: Penetration/Aspiration during swallow Penetration/Aspiration details (thin cup): Material enters  airway, remains ABOVE vocal cords then ejected out;Material  enters airway, remains ABOVE vocal cords and not ejected out Pharyngeal - Thin Straw: Penetration/Aspiration during swallow Pharyngeal - Solids Pharyngeal - Puree: Pharyngeal residue - valleculae (mild, clears  with second swallow) Pharyngeal - Regular: Delayed swallow initiation;Pharyngeal  residue - valleculae;Pharyngeal residue - posterior pharnyx Pharyngeal - Pill: Pharyngeal residue - valleculae Pharyngeal Phase - Comment Pharyngeal Comment: Pharyngeal residuals cleared with cued and  uncued second swallow performed by patient  Cervical Esophageal Phase    GO    Cervical Esophageal Phase Cervical Esophageal Phase: [name]         [name] 07/17/2013, 6:07 PM  Angela Nevin, MA, CCC-SLP [facility] Speech-Language Pathologist   ASSESSMENT AND PLAN AFib, rapid ventricular response; pneumonia, anemia, renal insufficiency  PLAN: AFib: COntinue Cardizem. Tolerating addition of metoprolol. Continue current dose. Declines long term anticoagulation. Acute on chronic diastolic heart failure. Continue Lasix as tolerated by renal function. Renal function improved.  ABx for pneumonia.  Anemia slightly improved today.     Signed, Cassell Clement  MD

## 2013-07-20 LAB — GLUCOSE, CAPILLARY: Glucose-Capillary: 222 mg/dL — ABNORMAL HIGH (ref 70–99)

## 2013-07-20 LAB — BASIC METABOLIC PANEL
BUN: 31 mg/dL — ABNORMAL HIGH (ref 6–23)
CO2: 29 mEq/L (ref 19–32)
Calcium: 7.8 mg/dL — ABNORMAL LOW (ref 8.4–10.5)
Creatinine, Ser: 1.03 mg/dL (ref 0.50–1.10)
GFR calc Af Amer: 59 mL/min — ABNORMAL LOW (ref >90)
GFR calc non Af Amer: 51 mL/min — ABNORMAL LOW (ref >90)
Sodium: 134 mEq/L — ABNORMAL LOW (ref 135–145)

## 2013-07-20 LAB — CBC
HCT: 26.3 % — ABNORMAL LOW (ref 36.0–46.0)
MCHC: 31.6 g/dL (ref 30.0–36.0)
MCV: 90.1 fL (ref 78.0–100.0)
Platelets: 335 10*3/uL (ref 150–400)
RDW: 19.9 % — ABNORMAL HIGH (ref 11.5–15.5)
WBC: 8.9 10*3/uL (ref 4.0–10.5)

## 2013-07-20 MED ORDER — LEVALBUTEROL HCL 0.63 MG/3ML IN NEBU
0.6300 mg | INHALATION_SOLUTION | Freq: Three times a day (TID) | RESPIRATORY_TRACT | Status: DC
  Administered 2013-07-20 – 2013-07-22 (×5): 0.63 mg via RESPIRATORY_TRACT
  Filled 2013-07-20 (×12): qty 3

## 2013-07-20 MED ORDER — LEVALBUTEROL HCL 0.63 MG/3ML IN NEBU
0.6300 mg | INHALATION_SOLUTION | RESPIRATORY_TRACT | Status: DC | PRN
  Administered 2013-07-22: 0.63 mg via RESPIRATORY_TRACT

## 2013-07-20 MED ORDER — INSULIN GLARGINE 100 UNIT/ML ~~LOC~~ SOLN
20.0000 [IU] | Freq: Every day | SUBCUTANEOUS | Status: DC
  Administered 2013-07-21 – 2013-07-22 (×2): 20 [IU] via SUBCUTANEOUS
  Filled 2013-07-20 (×2): qty 0.2

## 2013-07-20 NOTE — Progress Notes (Addendum)
       Patient Name: [name] Date of Encounter: 07/20/2013    SUBJECTIVE:Significant change with improved breathing since readmission.  TELEMETRY:  A fib with moderate rate control. Filed Vitals:   07/20/13 0810 07/20/13 1004 07/20/13 1006 07/20/13 1256  BP: 124/67 111/71 111/71 126/66  Pulse: 86  107 92  Temp: 98 F (36.7 C)   98.4 F (36.9 C)  TempSrc: Oral   Oral  Resp: 22   20  Height:      Weight:      SpO2: 96%   96%    Intake/Output Summary (Last 24 hours) at 07/20/13 1620 Last data filed at 07/20/13 1559  Gross per 24 hour  Intake   1000 ml  Output   2901 ml  Net  -1901 ml   I/O net = 5.6 L negative since admission.  LABS: Basic Metabolic Panel:  Recent Labs  16/10/96 0500 07/20/13 0410  NA 137 134*  K 4.9 3.8  CL 102 99  CO2 23 29  GLUCOSE 322* 118*  BUN 44* 31*  CREATININE 1.17* 1.03  CALCIUM 7.7* 7.8*   CBC:  Recent Labs  07/19/13 0754 07/20/13 0410  WBC 11.7* 8.9  HGB 8.7* 8.3*  HCT 27.0* 26.3*  MCV 89.1 90.1  PLT 337 335   BNP    Component Value Date/Time   PROBNP 31366.0* 07/15/2013 2101     Radiology/Studies:  07/19/13 CXR IMPRESSION:  Slight improvement in the patchy infiltrates on the right and  atelectasis on the left.    Physical Exam: Blood pressure 126/66, pulse 92, temperature 98.4 F (36.9 C), temperature source Oral, resp. rate 20, height 5' (1.524 m), weight 198 lb 10.2 oz (90.1 kg), SpO2 96.00%. Weight change: -1 lb 8.7 oz (-0.7 kg)   Decreased breath sounds diffuse.  ASSESSMENT:  1. Acute on chronic DHF aggravated by A fib and anemia. Improved since re-admission 2. A fib. With moderate rate control.  Plan:  1. Continue diuresis and attempts at rate control. 2. Prognosis appears poor. Selinda Eon 07/20/2013, 4:20 PM

## 2013-07-20 NOTE — Progress Notes (Signed)
Inpatient Diabetes Program Recommendations  AACE/ADA: New Consensus Statement on Inpatient Glycemic Control (2013)  Target Ranges:  Prepandial:   less than 140 mg/dL      Peak postprandial:   less than 180 mg/dL (1-2 hours)      Critically ill patients:  140 - 180 mg/dL   Hypoglycemia most likely due to the Amaryl 4 mg daily. Pt eating only 25% (as documented)  Inpatient Diabetes Program Recommendations Insulin - Basal: Please do not increase the lantus total daily dosage yet.  Fasting this am at 116, however in the past few days, they have been in 200's. Oral Agents: Please discontinue the Amaryl. Pt eating only 25% (oral agents may pose potential for hypoglycemia esp while in the hospital) Pt had low of 59 mg/dL before supper. Thank you, Lenor Coffin, RN, CNS, Diabetes Coordinator [phone])

## 2013-07-20 NOTE — Progress Notes (Signed)
Speech Language Pathology Treatment: Dysphagia  Patient Details Name: MAIKO SALAIS MRN: [id number] DOB: 12-Jan-1934 Today's Date: 07/20/2013 Time: 4098-1191 SLP Time Calculation (min): 8 min  Assessment / Plan / Recommendation Clinical Impression  F/u diet tolerance assessment complete. Min verbal cueing initially required for swallowing precautions with patient stating "he never told me those things."  Following teach back, patient with increased awareness of precautions however generally resistive to treatment. Overall based on MBS results and observation with liquids (refused solids), current diet remains appropriate. In light of mild dysphagia and h/o COPD which has the potential to impact coordination of breathing and swallowing, risk of aspiration moderate. No further acute SLP needs indicated. May benefit from short term SLP f/u at SNF to ensure continued use of precautions and for staff education.    HPI HPI: 79 year old female admitted 07/15/13 due to SOB, cough and weakness.  Pt had been DC'd on 12/3 (following stay for PNA, N/V) but was readmitted same day.  PMH significant for CAD, CHF, AFib, HTN, OSA, COPD, DM, GERD, recurrent PNA.  BSE requested to evaluate swallow function and safety.      SLP Plan  All goals met    Recommendations Diet recommendations: Dysphagia 3 (mechanical soft);Thin liquid Liquids provided via: Cup Medication Administration: Whole meds with liquid Supervision: Patient able to self feed;Intermittent supervision to cue for compensatory strategies Compensations: Slow rate;Small sips/bites;Follow solids with liquid Postural Changes and/or Swallow Maneuvers: Seated upright 90 degrees;Upright 30-60 min after meal              Oral Care Recommendations: Oral care BID Follow up Recommendations: Skilled Nursing facility Plan: All goals met    GO   Ferdinand Lango MA, CCC-SLP [phone]   Jarrick Fjeld Meryl 07/20/2013, 11:18 AM

## 2013-07-20 NOTE — Progress Notes (Signed)
Occupational Therapy Treatment Patient Details Name: [name] MRN: [id number] DOB: 09-15-1933 Today's Date: 07/20/2013 Time: 4098-1191 OT Time Calculation (min): 18 min  OT Assessment / Plan / Recommendation  History of present illness Pt readmitted to hospital after dc home earlier in the day.  Unable to get out of car at home and went down to ground.  Returned to hospital via EMS. Pt found to have HCAP.   OT comments  Pt requires total A +2 for all LB ADLs and functional transfers.   Pt fatigued this pm, but demonstrates increased difficulty moving sit to stand this pm  Follow Up Recommendations  SNF    Barriers to Discharge       Equipment Recommendations  None recommended by OT    Recommendations for Other Services    Frequency Min 2X/week   Progress towards OT Goals Progress towards OT goals: Not progressing toward goals - comment (lethargy)  Plan Discharge plan needs to be updated    Precautions / Restrictions Precautions Precautions: Fall   Pertinent Vitals/Pain     ADL  Toilet Transfer: +2 Total assistance Toilet Transfer: Patient Percentage: 40% Toilet Transfer Method: Ambulance person: Bedside commode Toileting - Clothing Manipulation and Hygiene: +1 Total assistance Where Assessed - Toileting Clothing Manipulation and Hygiene: Standing Transfers/Ambulation Related to ADLs: Total A  +2 (pt ~40%).  Pt unable to fully stand today ADL Comments: Pt continues to be limited by fatigue. She demonstrates max verbal cues for hand placement and technique    OT Diagnosis:    OT Problem List:   OT Treatment Interventions:     OT Goals(current goals can now be found in the care plan section) ADL Goals Pt Will Perform Grooming: with min assist;standing Pt Will Perform Upper Body Bathing: with set-up;sitting Pt Will Perform Lower Body Bathing: with min assist;sit to/from stand Pt Will Perform Upper Body Dressing: with  supervision;sitting Pt Will Perform Lower Body Dressing: with min assist;sit to/from stand Pt Will Transfer to Toilet: with min assist;ambulating;bedside commode;grab bars Pt Will Perform Toileting - Clothing Manipulation and hygiene: with min assist;sit to/from stand Additional ADL Goal #1: Pt will complete bed mobility with min A to sit EOB in prep for ADLs  Visit Information  Last OT Received On: 07/20/13 Assistance Needed: +2 History of Present Illness: Pt readmitted to hospital after dc home earlier in the day.  Unable to get out of car at home and went down to ground.  Returned to hospital via EMS. Pt found to have HCAP.    Subjective Data      Prior Functioning       Cognition  Cognition Arousal/Alertness: Lethargic Behavior During Therapy: WFL for tasks assessed/performed Overall Cognitive Status: Impaired/Different from baseline Area of Impairment: Safety/judgement General Comments: Pt continues to demonstrate decreased understanding of her deficits and impact on function    Mobility  Bed Mobility Bed Mobility: Not assessed Transfers Transfers: Sit to Stand;Stand to Sit Sit to Stand: 1: +2 Total assist;From chair/3-in-1 Sit to Stand: Patient Percentage: 40% Stand to Sit: 1: +2 Total assist;To chair/3-in-1 Stand to Sit: Patient Percentage: 40% Details for Transfer Assistance: Pt fatigued.  Unable to move fully into standing to transfer to [facility].  Max verbal cues for hand placement and technique    Exercises      Balance     End of Session OT - End of Session Activity Tolerance: Patient limited by fatigue Patient left: in chair;with call bell/phone within reach Nurse  Communication: Mobility status  GO     [name] M 07/20/2013, 2:37 PM

## 2013-07-20 NOTE — Plan of Care (Signed)
Problem: SLP Dysphagia Goals Goal: Misc Dysphagia Goal Outcome: Not Met (add Reason) Dysphagia 3 appropriate for energy conservation at this time

## 2013-07-20 NOTE — Progress Notes (Signed)
CSW received handoff from weekend CSW that pt chooses a bed at [facility]. CSW informed pt of bed offer. CSW called facility and notified them that this is pt's first choice. Facility verified that they can take pt, and that she has been a resident at this SNF before. Pt requested [facility] for PT, and CSW told SNF about pt's preference. Pt can go to [facility] when medically ready for discharge.   Maryclare Labrador, MSW, [facility] Clinical Social Worker [phone]

## 2013-07-20 NOTE — Progress Notes (Signed)
[facility] Progress Note [place] TEAM 1 - Stepdown ICU Team   [name] GNF:[id number] DOB: 11/28/1933 DOA: 07/15/2013 PCP: Gweneth Dimitri, MD  Brief narrative: 79 year old female patient with oxygen-dependent COPD, atrial fib, CHF and diabetes. Readmitted the same day after being discharged from the hospital.  She had originally been hospitalized for acute renal failure from nausea and diarrhea due to amiodarone. She returned home via private vehicle and was unable to get out of the car due to complaints of severe weakness and shortness of breath. Eventually EMS was called and the pt was transported back to the ED.  Upon arrival to the ER patient was found to be tachypneic and hypoxic on room air. On exam she had diminished air movement bilaterally. Chest x-ray demonstrated worsening of B airspace disease. Her symptoms did not improve on nasal cannula oxygen nor with nonrebreather mask and due to increased tachypnea and work of breathing she was placed on BiPAP. She was given a dose of Lasix in the ER but became hypotensive. She was empirically started on vancomycin and aztreonam for possible hospital-acquired pneumonia. She was also found to be hyperkalemic without EKG changes.  Assessment/Plan:  Acute on chronic respiratory failure with hypoxia:   A) Acute on chronic grade 2 diastolic heart failure -diuresed to point of noted rise in Creatinine and therefore Lasix dosing held  -resumed lasix 12/6 -cr remains stable with diuresis -due to acute diuresis from IV Lasix will leave Foley in place    B) Possible HCAP vs aspiration PNA - R > L multifocal infiltrates -has weaned down to 3L (home is 2L) -cont empiric anbx's x 7 days -added Flagyl due to concerns of ? aspiration etiology -SLP eval- MBS completed- Dysphagia 3 thin liquids   C) COPD with acute exacerbation? - doubt exacerbation therefore rapidly tapering steroids - has received 4 days of IV/oral steroids -  d/c'd prednisone 12/7   Atrial fibrillation with RVR - Cardizem and Lopressor per cardiology - rate difficult to control in the past- Amiodarone was effective in controlling rate but has not been able to tolerate Amiodarone due to severe nausea -Deemed not a candidate for TEE cardioversion and/or ablation unless she takes anticoagulation and per recent discussion with Dr. Eldridge Dace on 07/15/2013 patient did not want to begin Coumadin -Continue Plavix  DM -CBG elevated due to steroids  -dc Amaryl since poor PO intake and risk for hypoglycemia  Hyperkalemia -does not appear was on exogenous KCL at home - resolved  Anemia - currently at baseline - cont to follow - no overt bleeding noted.   HYPOTHYROIDISM -cont Synthroid -11/30 - TSH 2.9 w/ a free T4 of 1.35   Deconditioning -PT/OT eval - will need SNF   DVT prophylaxis: Subcutaneous heparin Code Status: Full Family Communication: no family present at time of exam today  Disposition Plan/Expected LOS: SDU for now- SNF when medically stable   Consultants: Cardiology  Antibiotics: Azactam 12/03 >>> Flagyl 12/04 >>> Vancomycin 12/03 >> 12/7  HPI/Subjective: Alert - endorses fatigue - doesn't remember ambulating - says hasn't  been OOB to chair since admit.  Denies cp or current sob.    Objective: Blood pressure 111/71, pulse 107, temperature 98 F (36.7 C), temperature source Oral, resp. rate 22, height 5' (1.524 m), weight 198 lb 10.2 oz (90.1 kg), SpO2 96.00%.  Intake/Output Summary (Last 24 hours) at 07/20/13 1126 Last data filed at 07/20/13 0811  Gross per 24 hour  Intake    545 ml  Output  2900 ml  Net  -2355 ml   Exam: General: No acute respiratory distress at rest  Lungs: Clear to auscultation bilaterally without wheezes or crackles, 3L- 97% Cardiovascular: Irregular rate and rhythm without murmur gallop or rub normal S1 and S2, no peripheral edema - HR in 90-110 range at rest  Abdomen: Nontender,  nondistended, soft, bowel sounds positive, no rebound, no ascites, no appreciable mass Musculoskeletal: No significant cyanosis, clubbing of bilateral lower extremities Neurological: Alert and oriented x 3, moves all extremities x 4 without focal neurological deficits but generalized symmetrical weakness  Scheduled Meds:  Scheduled Meds: . ipratropium  0.5 mg Nebulization TID   And  . albuterol  2.5 mg Nebulization TID  . allopurinol  100 mg Oral Daily  . aztreonam  1 g Intravenous Q8H  . budesonide  0.25 mg Nebulization BID  . clopidogrel  75 mg Oral Daily  . colchicine  0.3 mg Oral BID  . cyclobenzaprine  10 mg Oral Daily  . diltiazem  360 mg Oral Daily  . feeding supplement (GLUCERNA SHAKE)  237 mL Oral TID BM  . furosemide  40 mg Oral BID  . heparin  5,000 Units Subcutaneous Q8H  . insulin aspart  0-15 Units Subcutaneous TID WC  . insulin aspart  0-5 Units Subcutaneous QHS  . [START ON 07/21/2013] insulin glargine  20 Units Subcutaneous Daily  . isosorbide mononitrate  15 mg Oral Daily  . levothyroxine  25 mcg Oral Custom  . levothyroxine  50 mcg Oral Custom  . metoprolol tartrate  25 mg Oral BID  . metroNIDAZOLE  500 mg Oral Q8H  . oxybutynin  10 mg Oral Daily  . OxyCODONE  20 mg Oral Q12H  . ramelteon  8 mg Oral QHS  . vancomycin  1,250 mg Intravenous Q24H   Data Reviewed: Basic Metabolic Panel:  Recent Labs Lab 07/16/13 1745 07/17/13 0420 07/18/13 0510 07/19/13 0500 07/20/13 0410  NA 134* 137 133* 137 134*  K 5.7* 4.8 4.5 4.9 3.8  CL 98 101 99 102 99  CO2 15* 17* 24 23 29   GLUCOSE 265* 286* 312* 322* 118*  BUN 56* 57* 57* 44* 31*  CREATININE 1.86* 1.82* 1.52* 1.17* 1.03  CALCIUM 8.0* 7.7* 7.5* 7.7* 7.8*   Liver Function Tests:  Recent Labs Lab 07/16/13 0200 07/16/13 0335  AST 184* 170*  ALT 116* 116*  ALKPHOS 112 113  BILITOT 0.4 0.3  PROT 5.7* 5.8*  ALBUMIN 2.3* 2.2*   CBC:  Recent Labs Lab 07/15/13 1909  07/16/13 0335 07/17/13 0420  07/18/13 0510 07/19/13 0754 07/20/13 0410  WBC 12.5*  --  11.4* 9.4 8.6 11.7* 8.9  NEUTROABS 11.0*  --  10.2*  --   --   --   --   HGB 8.8*  < > 8.3* 7.6* 8.0* 8.7* 8.3*  HCT 27.4*  < > 25.1* 23.3* 24.4* 27.0* 26.3*  MCV 90.7  --  88.1 88.6 88.1 89.1 90.1  PLT 268  --  258 287 317 337 335  < > = values in this interval not displayed. Cardiac Enzymes:  Recent Labs Lab 07/16/13 0200  TROPONINI <0.30   BNP (last 3 results)  Recent Labs  06/07/13 1300 06/13/13 0406 07/15/13 2101  PROBNP 3554.0* 1503.0* 31366.0*   CBG:  Recent Labs Lab 07/19/13 1151 07/19/13 1721 07/19/13 1812 07/19/13 2138 07/20/13 0812  GLUCAP 183* 54* 84 123* 116*   Studies:  Recent x-ray studies have been reviewed in detail by the Attending  Physician  Algernon Huxley [phone] 07/20/2013, 11:26 AM  I have personally examined this patient and reviewed the entire database. I have reviewed the above note, made any necessary editorial changes, and agree with its content.  Lonia Blood, MD [facility]

## 2013-07-20 NOTE — Progress Notes (Signed)
Thank you for consult on [name]. Progress and therapy notes reviewed. Patient is deconditioned and prefers to get follow up therapies at [facility]. Will defer CIR consult for now.

## 2013-07-21 DIAGNOSIS — R5381 Other malaise: Secondary | ICD-10-CM

## 2013-07-21 DIAGNOSIS — E43 Unspecified severe protein-calorie malnutrition: Secondary | ICD-10-CM

## 2013-07-21 DIAGNOSIS — J159 Unspecified bacterial pneumonia: Secondary | ICD-10-CM

## 2013-07-21 DIAGNOSIS — I509 Heart failure, unspecified: Secondary | ICD-10-CM

## 2013-07-21 DIAGNOSIS — N289 Disorder of kidney and ureter, unspecified: Secondary | ICD-10-CM

## 2013-07-21 LAB — CBC
HCT: 25.4 % — ABNORMAL LOW (ref 36.0–46.0)
MCH: 29 pg (ref 26.0–34.0)
MCHC: 32.3 g/dL (ref 30.0–36.0)
Platelets: 332 10*3/uL (ref 150–400)
RDW: 19.3 % — ABNORMAL HIGH (ref 11.5–15.5)

## 2013-07-21 LAB — BASIC METABOLIC PANEL
BUN: 19 mg/dL (ref 6–23)
Calcium: 7.7 mg/dL — ABNORMAL LOW (ref 8.4–10.5)
GFR calc non Af Amer: 59 mL/min — ABNORMAL LOW (ref >90)
Glucose, Bld: 191 mg/dL — ABNORMAL HIGH (ref 70–99)

## 2013-07-21 LAB — GLUCOSE, CAPILLARY: Glucose-Capillary: 215 mg/dL — ABNORMAL HIGH (ref 70–99)

## 2013-07-21 MED ORDER — INSULIN ASPART 100 UNIT/ML ~~LOC~~ SOLN: 0.0000 [IU] | Freq: Every day | SUBCUTANEOUS | Status: DC

## 2013-07-21 MED ORDER — METRONIDAZOLE 500 MG PO TABS: 500.0000 mg | ORAL_TABLET | Freq: Three times a day (TID) | ORAL | Status: AC

## 2013-07-21 MED ORDER — LEVOFLOXACIN 500 MG PO TABS: 500.0000 mg | ORAL_TABLET | Freq: Every day | ORAL | Status: AC

## 2013-07-21 MED ORDER — GLUCERNA SHAKE PO LIQD: 237.0000 mL | Freq: Three times a day (TID) | ORAL | Status: DC

## 2013-07-21 MED ORDER — METOPROLOL TARTRATE 25 MG PO TABS: 25.0000 mg | ORAL_TABLET | Freq: Two times a day (BID) | ORAL | Status: DC

## 2013-07-21 MED ORDER — ALLOPURINOL 100 MG PO TABS: 100.0000 mg | ORAL_TABLET | Freq: Every day | ORAL | Status: DC

## 2013-07-21 MED ORDER — FUROSEMIDE 40 MG PO TABS: 40.0000 mg | ORAL_TABLET | Freq: Two times a day (BID) | ORAL | Status: DC

## 2013-07-21 MED ORDER — ENSURE COMPLETE PO LIQD
237.0000 mL | Freq: Two times a day (BID) | ORAL | Status: DC
  Administered 2013-07-21 – 2013-07-22 (×4): 237 mL via ORAL

## 2013-07-21 MED ORDER — POLYETHYLENE GLYCOL 3350 17 G PO PACK: 17.0000 g | PACK | Freq: Every day | ORAL | Status: DC | PRN

## 2013-07-21 MED ORDER — INSULIN ASPART 100 UNIT/ML ~~LOC~~ SOLN: SUBCUTANEOUS | Status: DC

## 2013-07-21 MED ORDER — GUAIFENESIN ER 600 MG PO TB12: 600.0000 mg | ORAL_TABLET | Freq: Two times a day (BID) | ORAL | Status: DC

## 2013-07-21 MED ORDER — ACETAMINOPHEN 325 MG PO TABS: 650.0000 mg | ORAL_TABLET | Freq: Four times a day (QID) | ORAL | Status: DC | PRN

## 2013-07-21 MED ORDER — INSULIN GLARGINE 100 UNIT/ML ~~LOC~~ SOLN: 20.0000 [IU] | Freq: Every day | SUBCUTANEOUS | Status: DC

## 2013-07-21 MED ORDER — GUAIFENESIN ER 600 MG PO TB12
600.0000 mg | ORAL_TABLET | Freq: Two times a day (BID) | ORAL | Status: DC
  Administered 2013-07-21 – 2013-07-22 (×3): 600 mg via ORAL
  Filled 2013-07-21 (×4): qty 1

## 2013-07-21 MED ORDER — INSULIN ASPART 100 UNIT/ML ~~LOC~~ SOLN: 0.0000 [IU] | Freq: Three times a day (TID) | SUBCUTANEOUS | Status: DC

## 2013-07-21 NOTE — Progress Notes (Signed)
CSW received call from pt's daughter [name]. [name] states her mother believes she is being discharged today, and [name] states she will not let her mother be discharged from the hospital today. [name] states her mother has pneumonia, and she will not allow her mother to be transferred from the hospital. CSW explained that the pt will not be discharged until she is medically stable, and CSW reiterated again that there is no discharge order in the chart. CSW explained that she will call [name] when discharge is imminent.   [name], MSW, [facility] Clinical Social Worker [phone]

## 2013-07-21 NOTE — Progress Notes (Signed)
I was informed that the room this patient was assigned to on 6E needed to be cleaned and zapped before this patient could be moved.  Report was given to [facility], Charity fundraiser on 2C.  Still waiting for a response from the IV team about restarting the patient's IV.  The patient should be transferred after someone from 6E calls to inform us that the room is ready. Nicole Cella, RN

## 2013-07-21 NOTE — Progress Notes (Signed)
Family at bedside-discussed concerns over dc today- concerned over congested lung sounds and mucoid production and profound deconditioning. They expressed concern over pt dc "too soon" at last admit. Will go ahead and transfer to Tele bed but keep on Team 1. Will dc foley, BSC, add Mucinex (family request), add IS/flutter valve. After 48 hours working with PT/OT maybe will be more appropriate for dc to rehab setting, will ask CIR to re-evaluate since family prefers over [facility] for rehab.On exam she still has 2+ bilateral LE edema and posterior basilar rhonchi with a few crackles R >L. See dc summary as today's note. Pt also clarified that does not want anticoagulation due to frequent falls.  Junious Silk, ANP

## 2013-07-21 NOTE — Progress Notes (Addendum)
NUTRITION FOLLOW UP  DOCUMENTATION CODES Per approved criteria  -Severe malnutrition in the context of chronic illness -Obesity Unspecified   Intervention:    D/C Glucerna Shakes  Continue Ensure Complete twice daily (350 kcals, 13 gm protein per 8 fl oz bottle) RD to follow for nutrition care plan  Nutrition Dx:   Malnutrition related to poor appetite with poor oral intake as evidenced by 6% weight loss in one month associated with intake </= 75% of estimated energy requirement for >/= 1 month, ongoing  Goal:   Pt to meet >/= 90% of their estimated nutrition needs   Monitor:   PO & supplemental intake, weight, labs, I/O's  Assessment:   Patient with PMH of CAD, CHF, atrial fibrillation, hypertension, COPD, sleep apnea on CPAP, and diabetes. She was discharged from the hospital 12/3 after her admission for nausea and vomiting which was thought to be secondary to amiodarone and it was stopped. After the discharge the patient went home and couldn't get out of the car due to severe weakness and shortness of breath. Since the family couldn't help the patient, patient was laid down on the ground and EMS was called; re-admitted on 12/3.   Patient transferred from 44M-Medical ICU to 2C-Stepdown 12/8.  Patient s/p MBSS 12/5 -- presented with mild oral & pharyngeal dysphagia.  Currently on a Dysphagia 3 (Carbohydrate Modified), thin liquid diet.  PO intake variable at 0-50% per flowsheet records.  Glucerna Shake & Ensure Complete supplements in place -- per RN, likes Ensure better.  Patient to discharge to [facility] once medically ready.  Height: Ht Readings from Last 1 Encounters:  07/16/13 5' (1.524 m)    Weight Status:   Wt Readings from Last 1 Encounters:  07/21/13 198 lb 6.6 oz (90 kg)    Re-estimated needs:  Kcal: 1600-1800 Protein: 80-100 gm Fluid: 1.6-1.8 L  Skin: Intact  Diet Order: Carb Control   Intake/Output Summary (Last 24 hours) at 07/21/13 1433 Last data  filed at 07/21/13 1300  Gross per 24 hour  Intake    545 ml  Output   3702 ml  Net  -3157 ml    Labs:   Recent Labs Lab 07/19/13 0500 07/20/13 0410 07/21/13 0427  NA 137 134* 136  K 4.9 3.8 3.7  CL 102 99 99  CO2 23 29 28   BUN 44* 31* 19  CREATININE 1.17* 1.03 0.91  CALCIUM 7.7* 7.8* 7.7*  GLUCOSE 322* 118* 191*    CBG (last 3)   Recent Labs  07/20/13 2055 07/21/13 0820 07/21/13 1144  GLUCAP 123* 193* 215*    Scheduled Meds: . allopurinol  100 mg Oral Daily  . aztreonam  1 g Intravenous Q8H  . budesonide  0.25 mg Nebulization BID  . clopidogrel  75 mg Oral Daily  . colchicine  0.3 mg Oral BID  . cyclobenzaprine  10 mg Oral Daily  . diltiazem  360 mg Oral Daily  . feeding supplement (ENSURE COMPLETE)  237 mL Oral BID BM  . feeding supplement (GLUCERNA SHAKE)  237 mL Oral TID BM  . furosemide  40 mg Oral BID  . guaiFENesin  600 mg Oral BID  . heparin  5,000 Units Subcutaneous Q8H  . insulin aspart  0-15 Units Subcutaneous TID WC  . insulin aspart  0-5 Units Subcutaneous QHS  . insulin glargine  20 Units Subcutaneous Daily  . ipratropium  0.5 mg Nebulization TID  . isosorbide mononitrate  15 mg Oral Daily  .  levalbuterol  0.63 mg Nebulization TID  . levothyroxine  25 mcg Oral Custom  . levothyroxine  50 mcg Oral Custom  . metoprolol tartrate  25 mg Oral BID  . metroNIDAZOLE  500 mg Oral Q8H  . oxybutynin  10 mg Oral Daily  . OxyCODONE  20 mg Oral Q12H  . ramelteon  8 mg Oral QHS    Continuous Infusions:   Maureen Chatters, RD, LDN Pager #: [phone] After-Hours Pager #: [phone]

## 2013-07-21 NOTE — Progress Notes (Signed)
ANTIBIOTIC CONSULT NOTE - follow up  Pharmacy Consult for Aztreonam Indication: pneumonia  Allergies  Allergen Reactions  . Cefuroxime Axetil Shortness Of Breath, Swelling and Rash    Throat swelling  . Cephalexin Shortness Of Breath, Swelling and Rash    Throat swelling  . Clarithromycin Shortness Of Breath, Swelling and Rash    Throat swelling  . Doxycycline Shortness Of Breath, Swelling and Rash     rash on legs and feet, throat swelling  . Erythromycin Shortness Of Breath, Swelling and Rash    Throat swelling  . Penicillins Shortness Of Breath, Swelling and Rash    Throat swelling  . Tetracycline Shortness Of Breath, Swelling and Rash    Throat swelling   Patient Measurements: Height: 5' (152.4 cm) Weight: 198 lb 6.6 oz (90 kg) IBW/kg (Calculated) : 45.5  Vital Signs: Temp: 98.2 F (36.8 C) (12/09 0742) Temp src: Oral (12/09 0742) BP: 128/64 mmHg (12/09 0946) Pulse Rate: 102 (12/09 0946) Intake/Output from previous day: 12/08 0701 - 12/09 0700 In: 580 [P.O.:480; IV Piggyback:100] Out: 3253 [Urine:3250; Stool:3] Intake/Output from this shift: Total I/O In: 240 [P.O.:240] Out: 400 [Urine:400]  Labs:  Recent Labs  07/19/13 0500 07/19/13 0754 07/20/13 0410 07/21/13 0427  WBC  --  11.7* 8.9 9.4  HGB  --  8.7* 8.3* 8.2*  PLT  --  337 335 332  CREATININE 1.17*  --  1.03 0.91   Estimated Creatinine Clearance: 51.7 ml/min (by C-G formula based on Cr of 0.91).  Microbiology: 12/3 Blood Cultures - Pending All past culture data has been negative  Medical History: Past Medical History  Diagnosis Date  . CAD (coronary artery disease)   . CHF (congestive heart failure)   . Paroxysmal atrial fibrillation   . Bradycardia   . Hypertension   . Renal artery stenosis   . COPD (chronic obstructive pulmonary disease)   . Obesity hypoventilation syndrome   . Pruritus   . Tracheobronchitis   . Acute rhinosinusitis   . Anemia   . Hypoxemia   . Peripheral edema    . Obesity, endogenous   . Diabetes mellitus   . Acid reflux disease   . Osteoarthritis, generalized   . Hypothyroidism   . Lung nodule     lingula  . OSA (obstructive sleep apnea)     NPSG 04/03/00--AHI 28.hr  . DVT (deep venous thrombosis) 2003    left leg  . Vaginal atrophy   . Hx of colonic polyps   . Acute diastolic heart failure   . Secondary pulmonary hypertension 06/09/2013    Medications:  Anti-infectives   Start     Dose/Rate Route Frequency Ordered Stop   07/21/13 0000  metroNIDAZOLE (FLAGYL) 500 MG tablet     500 mg Oral Every 8 hours 07/21/13 1002 07/24/13 2359   07/21/13 0000  levofloxacin (LEVAQUIN) 500 MG tablet     500 mg Oral Daily 07/21/13 1002 07/24/13 2359   07/19/13 0900  metroNIDAZOLE (FLAGYL) tablet 500 mg     500 mg Oral 3 times per day 07/19/13 0820     07/17/13 1800  metroNIDAZOLE (FLAGYL) tablet 500 mg  Status:  Discontinued     500 mg Oral 3 times per day 07/17/13 0934 07/19/13 0820   07/16/13 1000  metroNIDAZOLE (FLAGYL) IVPB 500 mg     500 mg 100 mL/hr over 60 Minutes Intravenous Every 8 hours 07/16/13 0900 07/17/13 1022   07/16/13 0600  aztreonam (AZACTAM) 1 g in dextrose 5 %  50 mL IVPB     1 g 100 mL/hr over 30 Minutes Intravenous 3 times per day 07/15/13 2149     07/16/13 0600  aztreonam (AZACTAM) 2 g in dextrose 5 % 50 mL IVPB  Status:  Discontinued     2 g 100 mL/hr over 30 Minutes Intravenous 3 times per day 07/16/13 0254 07/16/13 0322   07/15/13 2200  aztreonam (AZACTAM) 2 g in dextrose 5 % 50 mL IVPB     2 g 100 mL/hr over 30 Minutes Intravenous  Once 07/15/13 2124 07/16/13 0119   07/15/13 2200  vancomycin (VANCOCIN) 1,250 mg in sodium chloride 0.9 % 250 mL IVPB  Status:  Discontinued     1,250 mg 166.7 mL/hr over 90 Minutes Intravenous Every 24 hours 07/15/13 2149 07/20/13 1717   07/15/13 2130  vancomycin (VANCOCIN) 1,760 mg in sodium chloride 0.9 % 500 mL IVPB  Status:  Discontinued     20 mg/kg  88 kg 250 mL/hr over 120 Minutes  Intravenous  Once 07/15/13 2125 07/16/13 0039     Assessment: 79 year old female just discharged earlier 12/3 after being treated for atrial fibrillation, PNA, and weakness.  She is readmitted for weakness and shortness of breath Cxr shows worsening airation R>L.  She has multiple drug allergies, and began Vancomycin and Aztreonam for pneumonia coverage.  Her antibiotics have now been de-escalated to Aztreonam and Metronidazole only.  She seems to be tolerating these without noted complications.  Her renal function remains stable and her estimated crcl is 43ml/min   Goal of Therapy:  Therapeutic response to IV antibiotics  Plan:  Continue Aztreonam 1gm IV q8h Monitor renal function closely Follow available micro data  Nadara Mustard, PharmD., MS Clinical Pharmacist Pager:  [phone] Thank you for allowing pharmacy to be part of this patients care team. 07/21/2013 11:01 AM

## 2013-07-21 NOTE — Progress Notes (Signed)
Physical Therapy Treatment Patient Details Name: [name] MRN: [id number] DOB: August 20, 1933 Today's Date: 07/21/2013 Time: 4098-1191 PT Time Calculation (min): 24 min  PT Assessment / Plan / Recommendation  History of Present Illness Pt readmitted to hospital after dc home earlier in the day.  Unable to get out of car at home and went down to ground.  Returned to hospital via EMS. Pt found to have HCAP.   PT Comments   Pt admitted with above. Pt currently with functional limitations due to balance and endurance deficits as well as weakness.  Pt will benefit from skilled PT to increase their independence and safety with mobility to allow discharge to the venue listed below.   Follow Up Recommendations  SNF                 Equipment Recommendations  None recommended by PT        Frequency Min 3X/week   Progress towards PT Goals Progress towards PT goals: Progressing toward goals  Plan Current plan remains appropriate    Precautions / Restrictions Precautions Precautions: Fall Restrictions Weight Bearing Restrictions: No   Pertinent Vitals/Pain VSS except desat slightly with ambulation,no pain    Mobility  Bed Mobility Bed Mobility: Rolling Left;Left Sidelying to Sit;Sitting - Scoot to Edge of Bed Rolling Left: 4: Min assist Left Sidelying to Sit: 3: Mod assist Supine to Sit: 3: Mod assist;HOB elevated;With rails Sitting - Scoot to Edge of Bed: 3: Mod assist Sit to Supine: Not Tested (comment) Scooting to [facility]: Not tested (comment) Details for Bed Mobility Assistance: Patient needed help lifting trunk upright and moving legs/hips to edge of bed Transfers Transfers: Sit to Stand;Stand to Sit Sit to Stand: 1: +2 Total assist;From chair/3-in-1 Sit to Stand: Patient Percentage: 60% Stand to Sit: 1: +2 Total assist;To chair/3-in-1 Stand to Sit: Patient Percentage: 50% Stand Pivot Transfers: Not tested (comment) Details for Transfer Assistance: Max verbal cues for  hand placement and technique Ambulation/Gait Ambulation/Gait Assistance: 1: +2 Total assist Ambulation/Gait: Patient Percentage: 60% Ambulation Distance (Feet): 16 Feet (8 feet x 2) Assistive device: Rolling walker Ambulation/Gait Assistance Details: Pt needed cues for sequencing steps and RW.  Min assist for steadying. Gait Pattern: Step-through pattern;Decreased step length - left;Decreased step length - right;Shuffle;Trunk flexed Gait velocity: decreased Stairs: No Wheelchair Mobility Wheelchair Mobility: No    Exercises General Exercises - Lower Extremity Ankle Circles/Pumps: AROM;Both;10 reps;Supine Long Arc Quad: AROM;Both;10 reps;Seated Hip Flexion/Marching: AROM;Both;10 reps;Seated   PT Goals (current goals can now be found in the care plan section)    Visit Information  Last PT Received On: 07/21/13 Assistance Needed: +2 History of Present Illness: Pt readmitted to hospital after dc home earlier in the day.  Unable to get out of car at home and went down to ground.  Returned to hospital via EMS. Pt found to have HCAP.    Subjective Data  Subjective: "I am so weak."   Cognition  Cognition Arousal/Alertness: Awake/alert Behavior During Therapy: WFL for tasks assessed/performed Overall Cognitive Status: Impaired/Different from baseline Area of Impairment: Safety/judgement Safety/Judgement: Decreased awareness of deficits General Comments: Pt continues to demonstrate decreased understanding of her deficits and impact on function    Balance  Dynamic Sitting Balance Dynamic Sitting - Balance Support: No upper extremity supported;During functional activity;Feet unsupported Dynamic Sitting - Level of Assistance: 5: Stand by assistance Static Standing Balance Static Standing - Balance Support: Bilateral upper extremity supported Static Standing - Level of Assistance: 4: Min assist Static Standing -  Comment/# of Minutes: Pt unsteady even with RW needing steadying assist  throughout  End of Session PT - End of Session Equipment Utilized During Treatment: Gait belt;Oxygen Activity Tolerance: Patient limited by fatigue Patient left: in chair;with call bell/phone within reach Nurse Communication: Mobility status        [name],[name] 07/21/2013, 1:08 PM  [facility] [phone] (pager)

## 2013-07-21 NOTE — Progress Notes (Signed)
Limited inpt rehab beds available over the next 24 to 48 hrs. Other rehab venue options should be explored. I will follow up with pt and family tomorrow. [phone]

## 2013-07-21 NOTE — Progress Notes (Signed)
Transfer report called to Tina,RN on 6E at [phone]. Patient is stable at this time and will be transported to unit prior to shift change. Nicole Cella, RN

## 2013-07-21 NOTE — Progress Notes (Addendum)
SUBJECTIVE:  Complains of increased lung congestion  OBJECTIVE:   Vitals:   Filed Vitals:   07/21/13 0423 07/21/13 0742 07/21/13 0911 07/21/13 0946  BP: 125/77 144/74  128/64  Pulse: 93 86  102  Temp: 98.5 F (36.9 C) 98.2 F (36.8 C)    TempSrc: Oral Oral    Resp: 15 19    Height:      Weight: 198 lb 6.6 oz (90 kg)     SpO2: 98% 94% 95%    I&O's:   Intake/Output Summary (Last 24 hours) at 07/21/13 1105 Last data filed at 07/21/13 2130  Gross per 24 hour  Intake    580 ml  Output   3153 ml  Net  -2573 ml   TELEMETRY: Reviewed telemetry pt in atrial fibrillation with RVR at 102bpm     PHYSICAL EXAM General: Well developed, well nourished, in no acute distress Head: Eyes PERRLA, No xanthomas.   Normal cephalic and atramatic  Lungs: scattered rhonchi throughout Heart:   Irregularly irregular  S1 S2 Pulses are 2+ & equal. Abdomen: Bowel sounds are positive, abdomen soft and non-tender without masses Extremities:   No clubbing, cyanosis or edema.  DP +1 Neuro: Alert and oriented X 3. Psych:  Good affect, responds appropriately   LABS: Basic Metabolic Panel:  Recent Labs  86/57/84 0410 07/21/13 0427  NA 134* 136  K 3.8 3.7  CL 99 99  CO2 29 28  GLUCOSE 118* 191*  BUN 31* 19  CREATININE 1.03 0.91  CALCIUM 7.8* 7.7*   Liver Function Tests: No results found for this basename: AST, ALT, ALKPHOS, BILITOT, PROT, ALBUMIN,  in the last 72 hours No results found for this basename: LIPASE, AMYLASE,  in the last 72 hours CBC:  Recent Labs  07/20/13 0410 07/21/13 0427  WBC 8.9 9.4  HGB 8.3* 8.2*  HCT 26.3* 25.4*  MCV 90.1 89.8  PLT 335 332   Cardiac Enzymes: No results found for this basename: CKTOTAL, CKMB, CKMBINDEX, TROPONINI,  in the last 72 hours BNP: No components found with this basename: POCBNP,  D-Dimer: No results found for this basename: DDIMER,  in the last 72 hours Hemoglobin A1C: No results found for this basename: HGBA1C,  in the last 72  hours Fasting Lipid Panel: No results found for this basename: CHOL, HDL, LDLCALC, TRIG, CHOLHDL, LDLDIRECT,  in the last 72 hours Thyroid Function Tests: No results found for this basename: TSH, T4TOTAL, FREET3, T3FREE, THYROIDAB,  in the last 72 hours Anemia Panel: No results found for this basename: VITAMINB12, FOLATE, FERRITIN, TIBC, IRON, RETICCTPCT,  in the last 72 hours Coag Panel:   Lab Results  Component Value Date   INR 1.54* 07/15/2013   INR 1.19 06/10/2013   INR 1.26 06/09/2013    RADIOLOGY: Ct Abdomen Pelvis Wo Contrast  07/09/2013   CLINICAL DATA:  Abdominal pain. Nausea. Diarrhea. Acute renal failure.  EXAM: CT ABDOMEN AND PELVIS WITHOUT CONTRAST  TECHNIQUE: Multidetector CT imaging of the abdomen and pelvis was performed following the standard protocol without intravenous contrast.  COMPARISON:  05/20/2007  FINDINGS: Surgical clips noted from prior cholecystectomy. Noncontrast images of the liver, spleen, pancreas, and adrenal glands are normal in appearance. No evidence of renal calculi or hydronephrosis.  No soft tissue masses or lymphadenopathy identified within the abdomen or pelvis. Prior hysterectomy noted. Adnexal regions are unremarkable. Foley catheter seen within the bladder, and a pessary noted in the vagina. Sigmoid diverticulosis is noted, however there is no evidence  of diverticulitis. No evidence of inflammatory process or abnormal fluid collections.  IMPRESSION: No acute findings.  No evidence of hydronephrosis.  Diverticulosis. No radiographic evidence of diverticulitis.   Electronically Signed   By: Myles Rosenthal M.D.   On: 07/09/2013 22:42   Dg Chest 2 View  07/09/2013   CLINICAL DATA:  Abdominal pain with nausea and weakness, recent history of pneumonia  EXAM: CHEST  2 VIEW  COMPARISON:  CT scan of the chest of June 07, 2013.  FINDINGS: The lungs are well-expanded. The posterior right lower lobe infiltrate demonstrated on the previous CT scan is not evident on  this chest x-ray. The cardiac silhouette is top-normal in size. The pulmonary vascularity is not engorged. There is no pleural effusion. There is no pneumothorax. There is mild loss of height of multiple mid and lower thoracic vertebral bodies without evidence of high-grade which compression. The observed portions of the upper abdomen reveal no definite abnormal gas collection.  IMPRESSION: 1. There is no evidence of residual pneumonia in the right lower lobe. 2. There is borderline enlargement of cardiac silhouette without overt evidence of CHF.   Electronically Signed   By: David  [place]   On: 07/09/2013 16:49   Dg Chest Port 1 View  07/19/2013   CLINICAL DATA:  Hypoxia.  Shortness of breath.  EXAM: PORTABLE CHEST - 1 VIEW  COMPARISON:  07/15/2013 and 07/11/2013  FINDINGS: Small patchy infiltrates are present in the right upper lobe and at the right base but aeration is slightly improved. Slight atelectasis at the left base has improved. Heart size and pulmonary vascularity are within normal limits.  No acute osseous abnormality.  Small bilateral effusions.  IMPRESSION: Slight improvement in the patchy infiltrates on the right and atelectasis on the left.   Electronically Signed   By: Geanie Cooley M.D.   On: 07/19/2013 09:21   Dg Chest Port 1 View  07/15/2013   CLINICAL DATA:  Respiratory failure and fatigue. CHF. COPD. Diabetes.  EXAM: PORTABLE CHEST - 1 VIEW  COMPARISON:  07/11/2013  FINDINGS: Right glenohumeral joint arthroplasty. Cardiomegaly accentuated by AP portable technique. Advanced aortic atherosclerosis. Possible small pleural effusions. No pneumothorax. Patchy right upper and right lower lobe airspace disease. Minimal left base airspace disease.  IMPRESSION: Markedly worsened aeration, with right greater than left bilateral airspace disease. Favor infection or aspiration. Asymmetric pulmonary edema felt less likely.  Cardiomegaly with atherosclerosis and probable small bilateral pleural  effusions.   Electronically Signed   By: Jeronimo Greaves M.D.   On: 07/15/2013 19:39   Dg Chest Port 1 View  07/11/2013   CLINICAL DATA:  Hypoxia.  EXAM: PORTABLE CHEST - 1 VIEW  COMPARISON:  07/09/2013.  FINDINGS: Right lower lobe infiltrate has cleared. Minimal basilar atelectasis present. Heart size is stable. Pulmonary vascularity is normal. Right shoulder replacement. Degenerative changes left shoulder.  IMPRESSION: Previously identified right lower lobe infiltrate has cleared. Mild basilar atelectasis.   Electronically Signed   By: Maisie Fus  Register   On: 07/11/2013 15:04   Dg Swallowing Func-speech Pathology  07/17/2013   [name], CCC-SLP     07/17/2013  6:09 PM Objective Swallowing Evaluation: Modified Barium Swallowing Study   Patient Details  Name: MAIRIN LINDSLEY MRN: [id number] Date of Birth: 07-Apr-1934  Today's Date: 07/17/2013 Time: 4098-1191 SLP Time Calculation (min): 25 min  Past Medical History:  Past Medical History  Diagnosis Date  . CAD (coronary artery disease)   . CHF (congestive heart failure)   .  Paroxysmal atrial fibrillation   . Bradycardia   . Hypertension   . Renal artery stenosis   . COPD (chronic obstructive pulmonary disease)   . Obesity hypoventilation syndrome   . Pruritus   . Tracheobronchitis   . Acute rhinosinusitis   . Anemia   . Hypoxemia   . Peripheral edema   . Obesity, endogenous   . Diabetes mellitus   . Acid reflux disease   . Osteoarthritis, generalized   . Hypothyroidism   . Lung nodule     lingula  . OSA (obstructive sleep apnea)     NPSG 04/03/00--AHI 28.hr  . DVT (deep venous thrombosis) 2003    left leg  . Vaginal atrophy   . Hx of colonic polyps   . Acute diastolic heart failure   . Secondary pulmonary hypertension 06/09/2013   Past Surgical History:  Past Surgical History  Procedure Laterality Date  . Lumbar spine surgery    . Cholecystectomy    . Hemorroidectomy    . Repair wound fistula    . Right shoulder    . Elbow surgery    . Dilation and  curettage of uterus    . Breast surgery  1998    lumpectomy  . Oophorectomy  1959    BSO-? ovarian cancer   HPI:  79 year old female admitted 07/15/13 due to SOB, cough and  weakness.  Pt had been DC'd on 12/3 (following stay for PNA, N/V)  but was readmitted same day.  PMH significant for CAD, CHF, AFib,  HTN, OSA, COPD, DM, GERD, recurrent PNA.  BSE requested to  evaluate swallow function and safety.     Assessment / Plan / Recommendation Clinical Impression  Dysphagia Diagnosis: Mild oral phase dysphagia;Mild pharyngeal  phase dysphagia Clinical impression: Patient presents with mild oropharyngeal  dysphagia characterized by swallow initiation delay to vallecular  sinus,mild vallecular and posterior pharynx residuals, which  cleared with second swallow, and flash penetration of thin  liquids, which fully cleared without further incident. Patient  exhibited decreased anterior to posterior transit of solid  texture bolus. No aspiration observed during this MBSS.    Treatment Recommendation       Diet Recommendation Dysphagia 3 (Mechanical Soft);Thin liquid   Liquid Administration via: Cup;Straw Medication Administration: Whole meds with liquid Supervision: Patient able to self feed;Intermittent supervision  to cue for compensatory strategies Compensations: Slow rate;Small sips/bites;Follow solids with  liquid    Other  Recommendations Oral Care Recommendations: Oral care BID   Follow Up Recommendations  Inpatient Rehab    Frequency and Duration min 2x/week  1 week   Pertinent Vitals/Pain     SLP Swallow Goals   Patient will utilize recommended strategies during swallow to  increase swallowing safety with modified independence. Patient will tolerate trials of upgraded solids with no overt s/s  aspiration.    General Date of Onset: 07/15/13 HPI: 79 year old female admitted 07/15/13 due to SOB, cough and  weakness.  Pt had been DC'd on 12/3 (following stay for PNA, N/V)  but was readmitted same day.  PMH significant for  CAD, CHF, AFib,  HTN, OSA, COPD, DM, GERD, recurrent PNA.  BSE requested to  evaluate swallow function and safety. Type of Study: Modified Barium Swallowing Study Reason for Referral: Objectively evaluate swallowing function Previous Swallow Assessment: BSE Diet Prior to this Study: Dysphagia 3 (soft);Thin liquids Temperature Spikes Noted: No History of Recent Intubation: No Behavior/Cognition: Alert;Cooperative;Pleasant mood Oral Cavity - Dentition: Adequate natural dentition  Self-Feeding Abilities: Able to feed self Patient Positioning: Upright in chair Baseline Vocal Quality: Clear Volitional Cough: Strong Volitional Swallow: Able to elicit    Reason for Referral Objectively evaluate swallowing function   Oral Phase Oral Preparation/Oral Phase Oral Phase: Impaired Oral - Solids Oral - Regular: Delayed oral transit Oral Phase - Comment Oral Phase - Comment: delayed anterior to posterior transit of  regular solid bolus   Pharyngeal Phase Pharyngeal Phase Pharyngeal Phase: Impaired Pharyngeal - Thin Pharyngeal - Thin Cup: Penetration/Aspiration during swallow Penetration/Aspiration details (thin cup): Material enters  airway, remains ABOVE vocal cords then ejected out;Material  enters airway, remains ABOVE vocal cords and not ejected out Pharyngeal - Thin Straw: Penetration/Aspiration during swallow Pharyngeal - Solids Pharyngeal - Puree: Pharyngeal residue - valleculae (mild, clears  with second swallow) Pharyngeal - Regular: Delayed swallow initiation;Pharyngeal  residue - valleculae;Pharyngeal residue - posterior pharnyx Pharyngeal - Pill: Pharyngeal residue - valleculae Pharyngeal Phase - Comment Pharyngeal Comment: Pharyngeal residuals cleared with cued and  uncued second swallow performed by patient  Cervical Esophageal Phase    GO    Cervical Esophageal Phase Cervical Esophageal Phase: [name]         [name] 07/17/2013, 6:07 PM  Angela Nevin, MA, CCC-SLP [facility] Speech-Language  Pathologist   ASSESSMENT:  1. Acute on chronic DHF aggravated by A fib and anemia. Improved since re-admission  2. A fib.with fairly good rate control HR 74-110bpm   Plan:  1. Continue diuresis and attempts at rate control.  2. Prognosis appears poor. 3. Continue Cardizem/metoprolol    Quintella Reichert, MD  07/21/2013  11:05 AM

## 2013-07-21 NOTE — Consult Note (Signed)
Physical Medicine and Rehabilitation Consult Reason for Consult: Deconditioning/HCAP/CHF Referring Physician:    HPI: [name] is a 79 y.o. right-handed female multi-medical with history of CAD, congestive heart failure, atrial fibrillation, COPD. Patient with recent admission for nausea vomiting felt to be secondary to amiodarone which was stopped. She also had some acute renal insufficiency with Lasix and lisinopril were adjusted. Prior to these hospitalizations patient used a walker and was rather sedentary. After most recent discharge she went home but was unable to get out of the car due to severe weakness and shortness of breath and EMS was contacted. She was readmitted 07/15/2013 with chest x-ray suggestive of right sided infiltrate likely presenting as pneumonia versus aspiration. She was placed on broad-spectrum antibiotics as well as intravenous Solu-Medrol. Intravenous diuresis as directed. Her renal function remains stable. He was advised a seven-day course of antibiotics. Speech therapy followup for any dysphagia maintained on a regular consistency diet. Subcutaneous heparin for DVT prophylaxis. Noted anemia 8.2 felt to be chronic in nature. Physical and occupational therapy ongoing however last few days limited as was anticipated possible skilled nursing facility. M.D. as requested physical medicine rehabilitation consult to consider inpatient rehabilitation services.   Review of Systems  Respiratory: Positive for shortness of breath.   Cardiovascular: Positive for palpitations and leg swelling.  Gastrointestinal:       GERD  Musculoskeletal: Positive for back pain, joint pain and myalgias.  Psychiatric/Behavioral:       Anxiety  All other systems reviewed and are negative.   Past Medical History  Diagnosis Date  . CAD (coronary artery disease)   . CHF (congestive heart failure)   . Paroxysmal atrial fibrillation   . Bradycardia   . Hypertension   . Renal artery  stenosis   . COPD (chronic obstructive pulmonary disease)   . Obesity hypoventilation syndrome   . Pruritus   . Tracheobronchitis   . Acute rhinosinusitis   . Anemia   . Hypoxemia   . Peripheral edema   . Obesity, endogenous   . Diabetes mellitus   . Acid reflux disease   . Osteoarthritis, generalized   . Hypothyroidism   . Lung nodule     lingula  . OSA (obstructive sleep apnea)     NPSG 04/03/00--AHI 28.hr  . DVT (deep venous thrombosis) 2003    left leg  . Vaginal atrophy   . Hx of colonic polyps   . Acute diastolic heart failure   . Secondary pulmonary hypertension 06/09/2013   Past Surgical History  Procedure Laterality Date  . Lumbar spine surgery    . Cholecystectomy    . Hemorroidectomy    . Repair wound fistula    . Right shoulder    . Elbow surgery    . Dilation and curettage of uterus    . Breast surgery  1998    lumpectomy  . Oophorectomy  1959    BSO-? ovarian cancer   Family History  Problem Relation Age of Onset  . Pneumonia Mother   . Heart disease Mother   . Allergies Mother   . Emphysema Mother   . Arthritis Mother   . Heart attack Father   . Stroke Sister   . Diabetes Sister   . Heart disease Sister   . Diabetes Brother   . Diabetes      grandfather  . Clotting disorder Brother   . Arthritis Sister   . Breast cancer      aunt  . Bone  cancer      aunt   Social History:  reports that she quit smoking about 34 years ago. She has never used smokeless tobacco. She reports that she does not drink alcohol or use illicit drugs. Allergies:  Allergies  Allergen Reactions  . Cefuroxime Axetil Shortness Of Breath, Swelling and Rash    Throat swelling  . Cephalexin Shortness Of Breath, Swelling and Rash    Throat swelling  . Clarithromycin Shortness Of Breath, Swelling and Rash    Throat swelling  . Doxycycline Shortness Of Breath, Swelling and Rash     rash on legs and feet, throat swelling  . Erythromycin Shortness Of Breath, Swelling and  Rash    Throat swelling  . Penicillins Shortness Of Breath, Swelling and Rash    Throat swelling  . Tetracycline Shortness Of Breath, Swelling and Rash    Throat swelling   Medications Prior to Admission  Medication Sig Dispense Refill  . azelastine (ASTELIN) 137 MCG/SPRAY nasal spray Place 2 sprays into the nose daily. Use in each nostril as directed  90 mL  3  . budesonide (PULMICORT) 0.25 MG/2ML nebulizer solution Take 2 mLs (0.25 mg total) by nebulization 2 (two) times daily.  60 mL  12  . clopidogrel (PLAVIX) 75 MG tablet Take 75 mg by mouth daily.       . cyclobenzaprine (FLEXERIL) 10 MG tablet Take 10 mg by mouth daily.       Marland Kitchen diltiazem (CARDIZEM CD) 360 MG 24 hr capsule Take 1 capsule (360 mg total) by mouth daily.  30 capsule  1  . influenza vac split quadrivalent PF (FLUARIX) 0.5 ML injection Inject 0.5 mLs into the muscle once.      . isosorbide mononitrate (IMDUR) 15 mg TB24 24 hr tablet Take 0.5 tablets (15 mg total) by mouth daily.  30 tablet  1  . levalbuterol (XOPENEX) 0.63 MG/3ML nebulizer solution Take 3 mLs (0.63 mg total) by nebulization every 6 (six) hours as needed for wheezing or shortness of breath.  3 mL  12  . levothyroxine (SYNTHROID, LEVOTHROID) 25 MCG tablet Take 25 mcg by mouth See admin instructions. Take 1 tablet (25 mcg) on Monday, Wednesday, Friday (take 50 mcg on Tues, Thurs, Sat, Sun)      . levothyroxine (SYNTHROID, LEVOTHROID) 50 MCG tablet Take 50 mcg by mouth See admin instructions. Take 1 tablet (50 mcg) on Tuesday, Thursday, Saturday, Sunday (take 25 mcg on Monday, Wednesday and Friday)      . LORazepam (ATIVAN) 0.5 MG tablet Take 0.25 mg by mouth 3 (three) times daily.      . ondansetron (ZOFRAN) 4 MG tablet Take 4 mg by mouth every 8 (eight) hours as needed for nausea or vomiting.      Marland Kitchen oxybutynin (DITROPAN XL) 10 MG 24 hr tablet Take 10 mg by mouth daily.       . OxyCODONE (OXYCONTIN) 20 mg T12A 12 hr tablet Take 20 mg by mouth every 12 (twelve)  hours.      . ramelteon (ROZEREM) 8 MG tablet Take 8 mg by mouth at bedtime.      . [DISCONTINUED] ciprofloxacin (CIPRO) 500 MG tablet Take 1 tablet (500 mg total) by mouth 2 (two) times daily.  14 tablet  0  . [DISCONTINUED] colchicine 0.6 MG tablet Take 0.6 mg by mouth 2 (two) times daily.       . [DISCONTINUED] furosemide (LASIX) 40 MG tablet Take 0.5 tablets (20 mg total) by mouth daily.      . [  DISCONTINUED] glimepiride (AMARYL) 4 MG tablet Take 4 mg by mouth daily with breakfast. Once a day      . [DISCONTINUED] insulin glargine (LANTUS) 100 UNIT/ML injection Inject 30 Units into the skin at bedtime as needed (for CBG over 200).       . [DISCONTINUED] loperamide (IMODIUM A-D) 2 MG tablet Take 2-4 mg by mouth 3 (three) times daily as needed for diarrhea or loose stools.      . [DISCONTINUED] metoprolol (LOPRESSOR) 50 MG tablet Take 1 tablet (50 mg total) by mouth 2 (two) times daily.  60 tablet  1  . ibandronate (BONIVA) 150 MG tablet Take 150 mg by mouth every 30 (thirty) days. Take in the morning with a full glass of water, on an empty stomach, and do not take anything else by mouth or lie down for the next 30 min. (on or about the 25th of each month)      . nitroGLYCERIN (NITROSTAT) 0.4 MG SL tablet Place 0.4 mg under the tongue every 5 (five) minutes as needed for chest pain.       Marland Kitchen oxyCODONE-acetaminophen (PERCOCET/ROXICET) 5-325 MG per tablet Take 1-2 tablets by mouth every 6 (six) hours as needed for severe pain.  30 tablet  0  . [DISCONTINUED] lisinopril (PRINIVIL,ZESTRIL) 10 MG tablet Take 1 tablet (10 mg total) by mouth daily. Stop until follow up with PCP in 1 week        Home: Home Living Family/patient expects to be discharged to:: Private residence Living Arrangements: Spouse/significant other;Other relatives Available Help at Discharge: Family;Available 24 hours/day Type of Home: House Home Access: Other (comment) (stair lift) Home Layout: Multi-level Alternate Level  Stairs-Number of Steps: chair lift to second floor bedroom. family stays on 3rd level. Home Equipment: Walker - 4 wheels;Walker - standard;Tub bench;Cane - single point;Wheelchair - Careers adviser (comment) Additional Comments: pt uses RW at home  Functional History: Prior Function Comments: pt reports ind. with ambulation with AD but requires husband to donn/doff socks due to R hip pain (sciatica).  Functional Status:  Mobility: Bed Mobility Bed Mobility: Not assessed Supine to Sit: 3: Mod assist;HOB elevated;With rails Supine to Sit: Patient Percentage: 60% Sitting - Scoot to Edge of Bed: 3: Mod assist Sitting - Scoot to Edge of Bed: Patient Percentage: 60% Transfers Transfers: Sit to Stand;Stand to [employer] Transfers Sit to Stand: 1: +2 Total assist;From chair/3-in-1 Sit to Stand: Patient Percentage: 40% Stand to Sit: 1: +2 Total assist;To chair/3-in-1 Stand to Sit: Patient Percentage: 40% Stand Pivot Transfers: 3: Mod assist Ambulation/Gait Ambulation/Gait Assistance: Not tested (comment) (pt c/o too weak to walk without 2 person assist) Ambulation/Gait: Patient Percentage: 70% Ambulation Distance (Feet): 3 Feet Assistive device: Rolling walker Ambulation/Gait Assistance Details: Steps to recliner. Verbal cues to stand more erect. Gait Pattern: Step-through pattern;Decreased step length - left;Decreased step length - right;Shuffle;Trunk flexed    ADL: ADL Eating/Feeding: Independent Where Assessed - Eating/Feeding: Chair Grooming: Wash/dry hands;Wash/dry face;Brushing hair;Set up Where Assessed - Grooming: Supported sitting Upper Body Bathing: Moderate assistance Where Assessed - Upper Body Bathing: Unsupported sitting;Supported sitting Lower Body Bathing: Maximal assistance Where Assessed - Lower Body Bathing: Supported sit to stand Upper Body Dressing: Moderate assistance Where Assessed - Upper Body Dressing: Unsupported sitting Lower Body Dressing: +1 Total  assistance Where Assessed - Lower Body Dressing: Supported sit to stand Toilet Transfer: +2 Total assistance Toilet Transfer Method: Ambulance person: Bedside commode Equipment Used: Rolling walker Transfers/Ambulation Related to ADLs: Total A  +  2 (pt ~40%).  Pt unable to fully stand today ADL Comments: Pt continues to be limited by fatigue. She demonstrates max verbal cues for hand placement and technique  Cognition: Cognition Overall Cognitive Status: Impaired/Different from baseline Orientation Level: Oriented X4 Cognition Arousal/Alertness: Lethargic Behavior During Therapy: WFL for tasks assessed/performed Overall Cognitive Status: Impaired/Different from baseline Area of Impairment: Safety/judgement Safety/Judgement: Decreased awareness of deficits General Comments: Pt continues to demonstrate decreased understanding of her deficits and impact on function  Blood pressure 122/42, pulse 92, temperature 98.3 F (36.8 C), temperature source Oral, resp. rate 22, height 5' (1.524 m), weight 90 kg (198 lb 6.6 oz), SpO2 98.00%. Physical Exam  Vitals reviewed. Constitutional: She is oriented to person, place, and time.  HENT:  Head: Normocephalic.  Eyes: EOM are normal.  Neck: Normal range of motion. Neck supple. No thyromegaly present.  Cardiovascular:  Cardiac rate control  Respiratory:  Decreased breath sounds but clear to auscultation  GI: Soft. Bowel sounds are normal. She exhibits no distension.  Neurological: She is alert and oriented to person, place, and time. She displays abnormal reflex.  Followed basic commands. CN intact. UE grossly 4/5 prox to distal. LE HF 1+ , KE 2, ankles 3/5. Decreased sensation sole of feet.   Skin: Skin is warm and dry.  Psychiatric: She has a normal mood and affect. Her behavior is normal. Judgment and thought content normal.    Results for orders placed during the hospital encounter of 07/15/13 (from the past 24  hour(s))  GLUCOSE, CAPILLARY     Status: Abnormal   Collection Time    07/20/13 12:55 PM      Result Value Range   Glucose-Capillary 233 (*) 70 - 99 mg/dL  GLUCOSE, CAPILLARY     Status: Abnormal   Collection Time    07/20/13  4:38 PM      Result Value Range   Glucose-Capillary 222 (*) 70 - 99 mg/dL  GLUCOSE, CAPILLARY     Status: Abnormal   Collection Time    07/20/13  8:55 PM      Result Value Range   Glucose-Capillary 123 (*) 70 - 99 mg/dL   Comment 1 Notify RN    BASIC METABOLIC PANEL     Status: Abnormal   Collection Time    07/21/13  4:27 AM      Result Value Range   Sodium 136  135 - 145 mEq/L   Potassium 3.7  3.5 - 5.1 mEq/L   Chloride 99  96 - 112 mEq/L   CO2 28  19 - 32 mEq/L   Glucose, Bld 191 (*) 70 - 99 mg/dL   BUN 19  6 - 23 mg/dL   Creatinine, Ser 5.62  0.50 - 1.10 mg/dL   Calcium 7.7 (*) 8.4 - 10.5 mg/dL   GFR calc non Af Amer 59 (*) >90 mL/min   GFR calc Af Amer 69 (*) >90 mL/min  CBC     Status: Abnormal   Collection Time    07/21/13  4:27 AM      Result Value Range   WBC 9.4  4.0 - 10.5 K/uL   RBC 2.83 (*) 3.87 - 5.11 MIL/uL   Hemoglobin 8.2 (*) 12.0 - 15.0 g/dL   HCT 13.0 (*) 86.5 - 78.4 %   MCV 89.8  78.0 - 100.0 fL   MCH 29.0  26.0 - 34.0 pg   MCHC 32.3  30.0 - 36.0 g/dL   RDW 69.6 (*) 29.5 - 28.4 %  Platelets 332  150 - 400 K/uL  GLUCOSE, CAPILLARY     Status: Abnormal   Collection Time    07/21/13  8:20 AM      Result Value Range   Glucose-Capillary 193 (*) 70 - 99 mg/dL   No results found.  Assessment/Plan: Diagnosis: deconditioning after multiple medical issues and multiple hospitilizations 1. Does the need for close, 24 hr/day medical supervision in concert with the patient's rehab needs make it unreasonable for this patient to be served in a less intensive setting? Yes 2. Co-Morbidities requiring supervision/potential complications: dm, copd, chf 3. Due to bladder management, bowel management, safety, skin/wound care, disease  management, medication administration, pain management and patient education, does the patient require 24 hr/day rehab nursing? Yes 4. Does the patient require coordinated care of a physician, rehab nurse, PT (1-2 hrs/day, 5 days/week) and OT (1-2 hrs/day, 5 days/week) to address physical and functional deficits in the context of the above medical diagnosis(es)? Yes Addressing deficits in the following areas: balance, endurance, locomotion, strength, transferring, bowel/bladder control, bathing, dressing, feeding, grooming, toileting and psychosocial support 5. Can the patient actively participate in an intensive therapy program of at least 3 hrs of therapy per day at least 5 days per week? Yes 6. The potential for patient to make measurable gains while on inpatient rehab is excellent 7. Anticipated functional outcomes upon discharge from inpatient rehab are supervision to mod I with PT, supervision with OT, n/a with SLP. 8. Estimated rehab length of stay to reach the above functional goals is: 12-14 days 9. Does the patient have adequate social supports to accommodate these discharge functional goals? Yes 10. Anticipated D/C setting: Home 11. Anticipated post D/C treatments: HH therapy 12. Overall Rehab/Functional Prognosis: excellent  RECOMMENDATIONS: This patient's condition is appropriate for continued rehabilitative care in the following setting: CIR Patient has agreed to participate in recommended program. Yes Note that insurance prior authorization may be required for reimbursement for recommended care.  Comment: She could benefit from inpatient rehab. Her husband has been looking at [facility]. They will discuss together tonight. Rehab RN to follow up.   Ranelle Oyster, MD, [place] [facility]     07/21/2013

## 2013-07-21 NOTE — Progress Notes (Signed)
[facility] Progress Note [place] TEAM 1 - Stepdown ICU Team   [name] ZOX:[id number] DOB: 12-12-1933 DOA: 07/15/2013 PCP: Gweneth Dimitri, MD  Brief narrative: 79 year old female patient with oxygen-dependent COPD, atrial fib, CHF and diabetes. Readmitted the same day after being discharged from the hospital.  She had originally been hospitalized for acute renal failure from nausea and diarrhea due to amiodarone. She returned home via private vehicle and was unable to get out of the car due to complaints of severe weakness and shortness of breath. Eventually EMS was called and the pt was transported back to the ED.  Upon arrival to the ER patient was found to be tachypneic and hypoxic on room air. On exam she had diminished air movement bilaterally. Chest x-ray demonstrated worsening of B airspace disease. Her symptoms did not improve on nasal cannula oxygen nor with nonrebreather mask and due to increased tachypnea and work of breathing she was placed on BiPAP. She was given a dose of Lasix in the ER but became hypotensive. She was empirically started on vancomycin and aztreonam for possible hospital-acquired pneumonia. She was also found to be hyperkalemic without EKG changes.  Assessment/Plan:  Acute on chronic respiratory failure with hypoxia:   A) Acute on chronic grade 2 diastolic heart failure -diuresed to point of noted rise in Creatinine and therefore Lasix dosing held  -resumed lasix 12/6 -cr remains stable with diuresis    B) Possible HCAP vs aspiration PNA - R > L multifocal infiltrates -has weaned down to 2L (home is 2L) -cont empiric anbx's x 7 days -finally mobilizing secretions- add Mucinex /IS/ flutter valve -added Flagyl due to concerns of ? aspiration etiology -SLP eval- MBS completed- Dysphagia 3 thin liquids   C) COPD with acute exacerbation? - doubt exacerbation therefore rapidly tapered steroids - has received 4 days of IV/oral steroids -  d/c'd prednisone 12/7   Atrial fibrillation with RVR - Cardizem and Lopressor per cardiology - rate difficult to control in the past- Amiodarone was effective in controlling rate but has not been able to tolerate Amiodarone due to severe nausea -Deemed not a candidate for TEE cardioversion and/or ablation unless she takes anticoagulation and per recent discussion with Dr. Eldridge Dace on 07/15/2013 patient did not want to begin Coumadin -Continue Plavix  DM -CBG elevated due to steroids  -dc Amaryl since poor PO intake and risk for hypoglycemia  Hyperkalemia -does not appear was on exogenous KCL at home - resolved  Anemia - currently at baseline - cont to follow - no overt bleeding noted.   HYPOTHYROIDISM -cont Synthroid -11/30 - TSH 2.9 w/ a free T4 of 1.35   Deconditioning -PT/OT following -initially pt stated to CIR preferred [facility] but family more interested in CIR so will ask to return for eval -[facility]SC  DVT prophylaxis: Subcutaneous heparin Code Status: Full Family Communication: discussed with daughter Disposition Plan/Expected LOS: SDU for now- SNF when medically stable   Consultants: Cardiology  Antibiotics: Azactam 12/03 >>> Flagyl 12/04 >>> Vancomycin 12/03 >> 12/7  HPI/Subjective: Alert - endorses fatigue - Denies cp or current sob.    Objective: Blood pressure 122/42, pulse 92, temperature 98.3 F (36.8 C), temperature source Oral, resp. rate 22, height 5' (1.524 m), weight 198 lb 6.6 oz (90 kg), SpO2 98.00%.  Intake/Output Summary (Last 24 hours) at 07/21/13 1243 Last data filed at 07/21/13 0918  Gross per 24 hour  Intake    580 ml  Output   3153 ml  Net  -  2573 ml   Exam: General: No acute respiratory distress at rest  Lungs: Congested bilaterally with basilar rhonchi, 2L- 97% Cardiovascular: Irregular rate and rhythm without murmur gallop or rub normal S1 and S2, 2+ bilateral peripheral LE edema - HR in 90-110 range at rest  Abdomen: Nontender,  nondistended, soft, bowel sounds positive, no rebound, no ascites, no appreciable mass Musculoskeletal: No significant cyanosis, clubbing of bilateral lower extremities Neurological: Alert and oriented x 3, moves all extremities x 4 without focal neurological deficits but generalized symmetrical weakness  Scheduled Meds:  Scheduled Meds: . allopurinol  100 mg Oral Daily  . aztreonam  1 g Intravenous Q8H  . budesonide  0.25 mg Nebulization BID  . clopidogrel  75 mg Oral Daily  . colchicine  0.3 mg Oral BID  . cyclobenzaprine  10 mg Oral Daily  . diltiazem  360 mg Oral Daily  . feeding supplement (ENSURE COMPLETE)  237 mL Oral BID BM  . feeding supplement (GLUCERNA SHAKE)  237 mL Oral TID BM  . furosemide  40 mg Oral BID  . guaiFENesin  600 mg Oral BID  . heparin  5,000 Units Subcutaneous Q8H  . insulin aspart  0-15 Units Subcutaneous TID WC  . insulin aspart  0-5 Units Subcutaneous QHS  . insulin glargine  20 Units Subcutaneous Daily  . ipratropium  0.5 mg Nebulization TID  . isosorbide mononitrate  15 mg Oral Daily  . levalbuterol  0.63 mg Nebulization TID  . levothyroxine  25 mcg Oral Custom  . levothyroxine  50 mcg Oral Custom  . metoprolol tartrate  25 mg Oral BID  . metroNIDAZOLE  500 mg Oral Q8H  . oxybutynin  10 mg Oral Daily  . OxyCODONE  20 mg Oral Q12H  . ramelteon  8 mg Oral QHS   Data Reviewed: Basic Metabolic Panel:  Recent Labs Lab 07/17/13 0420 07/18/13 0510 07/19/13 0500 07/20/13 0410 07/21/13 0427  NA 137 133* 137 134* 136  K 4.8 4.5 4.9 3.8 3.7  CL 101 99 102 99 99  CO2 17* 24 23 29 28   GLUCOSE 286* 312* 322* 118* 191*  BUN 57* 57* 44* 31* 19  CREATININE 1.82* 1.52* 1.17* 1.03 0.91  CALCIUM 7.7* 7.5* 7.7* 7.8* 7.7*   Liver Function Tests:  Recent Labs Lab 07/16/13 0200 07/16/13 0335  AST 184* 170*  ALT 116* 116*  ALKPHOS 112 113  BILITOT 0.4 0.3  PROT 5.7* 5.8*  ALBUMIN 2.3* 2.2*   CBC:  Recent Labs Lab 07/15/13 1909  07/16/13 0335  07/17/13 0420 07/18/13 0510 07/19/13 0754 07/20/13 0410 07/21/13 0427  WBC 12.5*  --  11.4* 9.4 8.6 11.7* 8.9 9.4  NEUTROABS 11.0*  --  10.2*  --   --   --   --   --   HGB 8.8*  < > 8.3* 7.6* 8.0* 8.7* 8.3* 8.2*  HCT 27.4*  < > 25.1* 23.3* 24.4* 27.0* 26.3* 25.4*  MCV 90.7  --  88.1 88.6 88.1 89.1 90.1 89.8  PLT 268  --  258 287 317 337 335 332  < > = values in this interval not displayed. Cardiac Enzymes:  Recent Labs Lab 07/16/13 0200  TROPONINI <0.30   BNP (last 3 results)  Recent Labs  06/07/13 1300 06/13/13 0406 07/15/13 2101  PROBNP 3554.0* 1503.0* 31366.0*   CBG:  Recent Labs Lab 07/20/13 1255 07/20/13 1638 07/20/13 2055 07/21/13 0820 07/21/13 1144  GLUCAP 233* 222* 123* 193* 215*   Studies:  Recent  x-ray studies have been reviewed in detail by the Attending Physician  [facility]ANP [phone] 07/21/2013, 12:43 PM   I have examined the patient, reviewed the chart and modified the above note which I agree with.   Rayman Petrosian,MD [phone] 07/21/2013, 7:55 PM

## 2013-07-21 NOTE — Progress Notes (Signed)
Patient's daughter at bedside.  Family and patient verbally express concerns regarding patient transferring to SNF today.  Daughter stated that she "did not want her mother moved anywhere until her lungs are completely clear".  Dr Butler Denmark notified of their concerns and refusal for transfer.  Rizwan agreed to come speak to the family this afternoon.  Patient remains stable at this time.

## 2013-07-21 NOTE — Discharge Summary (Signed)
Physician Discharge Summary  [name] AVW:[id number] DOB: Nov 24, 1933 DOA: 07/15/2013  PCP: Gweneth Dimitri, MD  Admit date: 07/15/2013 Discharge date: 07/22/2013  Time spent: 30 minutes  Recommendations for Outpatient Follow-up:  1. Schedule outpatient follow up with PCP once discharged from SNF 2. Daily weights, notify MD if gains weight (see below) 3. Recommend BMET/electrolyte panel 12/12  Discharge Diagnoses:    Acute on chronic diastolic heart failure-resolving   HCAP (healthcare-associated pneumonia)-resolving   Acute and chronic respiratory failure with hypoxia-stable   COPD with acute exacerbation-compensated   Hyperkalemia   HYPOTHYROIDISM   DM   Profound deconditioning  Discharge Condition: stable  Diet recommendation: Carbohydrate modified  Filed Weights   07/20/13 0458 07/21/13 0423 07/21/13 2147  Weight: 198 lb 10.2 oz (90.1 kg) 198 lb 6.6 oz (90 kg) 190 lb 11.2 oz (86.501 kg)    History of present illness:  79 year old female patient with oxygen-dependent COPD, atrial fib, CHF and diabetes. Readmitted the same day after being discharged from the hospital. She had originally been hospitalized for acute renal failure from nausea and diarrhea due to amiodarone. She returned home via private vehicle and was unable to get out of the car due to complaints of severe weakness and shortness of breath. Eventually EMS was called and the pt was transported back to the ED.   Upon arrival to the ER patient was found to be tachypneic and hypoxic on room air. On exam she had diminished air movement bilaterally. Chest x-ray demonstrated worsening of B airspace disease. Her symptoms did not improve on nasal cannula oxygen nor with nonrebreather mask and due to increased tachypnea and work of breathing she was placed on BiPAP. She was given a dose of Lasix in the ER but became hypotensive. She was empirically started on vancomycin and aztreonam for possible hospital-acquired  pneumonia. She was also found to be hyperkalemic without EKG changes.   Hospital Course:   Acute on chronic respiratory failure with hypoxia:  A) Acute on chronic grade 2 diastolic heart failure  -Within first 24 hours of admission she was diuresed to point of noted rise in Creatinine and therefore Lasix dosing was temporarily held. It was suspected that hypoxia pre admit contributed to acute HF exacerbation -resumed lasix 12/6 and Scr remained stable with diuresis  -Recommend repeat BMET 12/12 -needs daily weights for volume status monitoring   B) Possible HCAP vs aspiration PNA - R > L multifocal infiltrates  -has weaned down to 2L (home is 2L)  -Started on Azactam at admission-added Flagyl due to concerns of ? aspiration etiology -will dc Azactam in favor of Levaquin and continue both for 3 more days after dc -SLP eval- MBS completed- Dysphagia 3 thin liquids   C) COPD with acute exacerbation?  - doubted acute exacerbation therefore rapidly tapered steroids  - received 4 days of IV/oral steroids (Prednisone stopped 12/07)  Atrial fibrillation with RVR  - Cardizem and Lopressor were managed per Cardiology  - rate difficult to control in the past- Amiodarone was effective in controlling rate but the patient has not been able to tolerate Amiodarone due to severe nausea  -Deemed not a candidate for TEE cardioversion and/or ablation unless she takes anticoagulation and per recent discussion with Dr. Eldridge Dace on 07/15/2013 patient did not want to begin Coumadin due to frequent falls -Continue Plavix   DM  -CBG was somewhat elevated due to steroids  -dc'd Amaryl since poor PO intake and risk for hypoglycemia  -continue Lantus (but at dose  lower than pre admit) with SSI until can resume Amaryl safely   Hyperkalemia  -does not appear was on exogenous KCL at home - resolved   Anemia  - currently at baseline  - no overt bleeding noted.   HYPOTHYROIDISM  -cont Synthroid  -11/30 - TSH  2.9 w/ a free T4 of 1.35   Gout -due to recent acute renal failure and continued need for diuresis have stopped pre admit Colchicine in favor of Allopurinol  Deconditioning  -PT/OT eval - plan dc to [facility] for continued [facility]- hopeful to return to home with husband - high liklihood for acute decompensation medically requiring repeat hospitalization   Procedures: None  Consultations: Cardiology   Discharge Exam: Filed Vitals:   07/22/13 0920  BP: 119/54  Pulse: 98  Temp: 98.1 F (36.7 C)  Resp: 16   General: No acute respiratory distress at rest  Lungs: Posterior rhonchi with a few scattered crackles (R > L), 2L  Cardiovascular: Irregular rate and rhythm without murmur gallop or rub normal S1 and S2, 2+ bilateral peripheral edema - HR in 80-100 range at rest  Abdomen: Nontender, nondistended, soft, bowel sounds positive, no rebound, no ascites, no appreciable mass  Musculoskeletal: No significant cyanosis, clubbing of bilateral lower extremities  Neurological: Alert and oriented x 3, moves all extremities x 4 without focal neurological deficits but generalized symmetrical weakness   Discharge Instructions      Discharge Orders   Future Appointments Provider Department Dept Phone   08/25/2013 3:30 PM [facility] [place] [facility] [phone]   09/04/2013 12:45 PM Brook E Amundson de Gwenevere Ghazi, MD [facility] [phone]   10/21/2013 1:45 PM Everette Rank, MD [facility] [phone]   10/22/2013 1:30 PM Waymon Budge, MD [facility] [phone]   Future Orders Complete By Expires   (HEART FAILURE PATIENTS) Call MD:  Anytime you have any of the following symptoms: 1) 3 pound weight gain in 24 hours or 5 pounds in 1 week 2) shortness of breath, with or without a dry hacking cough 3) swelling in the hands, feet or stomach 4) if you have to sleep on extra pillows at night in order to  breathe.  As directed    Call MD for:  difficulty breathing, headache or visual disturbances  As directed    Call MD for:  extreme fatigue  As directed    Call MD for:  persistant dizziness or light-headedness  As directed    Diet Carb Modified  As directed    Increase activity slowly  As directed        Medication List    STOP taking these medications       ciprofloxacin 500 MG tablet  Commonly known as:  CIPRO     colchicine 0.6 MG tablet     glimepiride 4 MG tablet  Commonly known as:  AMARYL     lisinopril 10 MG tablet  Commonly known as:  PRINIVIL,ZESTRIL     loperamide 2 MG tablet  Commonly known as:  IMODIUM A-D      TAKE these medications       acetaminophen 325 MG tablet  Commonly known as:  TYLENOL  Take 2 tablets (650 mg total) by mouth every 6 (six) hours as needed for mild pain or headache.     allopurinol 100 MG tablet  Commonly known as:  ZYLOPRIM  Take 1 tablet (100 mg total) by mouth daily.  azelastine 137 MCG/SPRAY nasal spray  Commonly known as:  ASTELIN  Place 2 sprays into the nose daily. Use in each nostril as directed     budesonide 0.25 MG/2ML nebulizer solution  Commonly known as:  PULMICORT  Take 2 mLs (0.25 mg total) by nebulization 2 (two) times daily.     clopidogrel 75 MG tablet  Commonly known as:  PLAVIX  Take 75 mg by mouth daily.     cyclobenzaprine 10 MG tablet  Commonly known as:  FLEXERIL  Take 10 mg by mouth daily.     diltiazem 360 MG 24 hr capsule  Commonly known as:  CARDIZEM CD  Take 1 capsule (360 mg total) by mouth daily.     DITROPAN XL 10 MG 24 hr tablet  Generic drug:  oxybutynin  Take 10 mg by mouth daily.     feeding supplement (GLUCERNA SHAKE) Liqd  Take 237 mLs by mouth 3 (three) times daily between meals.     feeding supplement (ENSURE COMPLETE) Liqd  Take 237 mLs by mouth 2 (two) times daily between meals.     furosemide 40 MG tablet  Commonly known as:  LASIX  Take 1 tablet (40 mg total) by  mouth 2 (two) times daily.     guaiFENesin 600 MG 12 hr tablet  Commonly known as:  MUCINEX  Take 1 tablet (600 mg total) by mouth 2 (two) times daily.     ibandronate 150 MG tablet  Commonly known as:  BONIVA  Take 150 mg by mouth every 30 (thirty) days. Take in the morning with a full glass of water, on an empty stomach, and do not take anything else by mouth or lie down for the next 30 min. (on or about the 25th of each month)     influenza vac split quadrivalent PF 0.5 ML injection  Commonly known as:  FLUARIX  Inject 0.5 mLs into the muscle once.     insulin aspart 100 UNIT/ML injection  Commonly known as:  novoLOG  - Check CBG before meals.  - CBG 121-150 give 2 units   - CBG 151-200 give 3 units  - CBG 201-250 give 5 units  - CBG 251-300 give 8 units  - CBG 301-350 give 11 units  - CBG 351-400 give 15 units  - If CBG > 400 CALL MD     insulin aspart 100 UNIT/ML injection  Commonly known as:  novoLOG  - Check CBG at HS  - CBG 201-250 give 2 units  - CBG 251-300 give 3 units  - CBG 301-350 give 4 units  - CBG 351-400 give 5 units  - For CBG > 400 CALL MD     insulin glargine 100 UNIT/ML injection  Commonly known as:  LANTUS  Inject 0.2 mLs (20 Units total) into the skin daily.     isosorbide mononitrate 15 mg Tb24 24 hr tablet  Commonly known as:  IMDUR  Take 0.5 tablets (15 mg total) by mouth daily.     levalbuterol 0.63 MG/3ML nebulizer solution  Commonly known as:  XOPENEX  Take 3 mLs (0.63 mg total) by nebulization every 6 (six) hours as needed for wheezing or shortness of breath.     levofloxacin 500 MG tablet  Commonly known as:  LEVAQUIN  Take 1 tablet (500 mg total) by mouth daily.     levothyroxine 50 MCG tablet  Commonly known as:  SYNTHROID, LEVOTHROID  Take 50 mcg by mouth See admin instructions. Take  1 tablet (50 mcg) on Tuesday, Thursday, Saturday, Sunday (take 25 mcg on Monday, Wednesday and Friday)     levothyroxine 25 MCG tablet   Commonly known as:  SYNTHROID, LEVOTHROID  Take 25 mcg by mouth See admin instructions. Take 1 tablet (25 mcg) on Monday, Wednesday, Friday (take 50 mcg on Tues, Thurs, Sat, Sun)     LORazepam 0.5 MG tablet  Commonly known as:  ATIVAN  Take 0.25 mg by mouth 3 (three) times daily.     metoprolol tartrate 25 MG tablet  Commonly known as:  LOPRESSOR  Take 1 tablet (25 mg total) by mouth 2 (two) times daily.     metroNIDAZOLE 500 MG tablet  Commonly known as:  FLAGYL  Take 1 tablet (500 mg total) by mouth every 8 (eight) hours.     nitroGLYCERIN 0.4 MG SL tablet  Commonly known as:  NITROSTAT  Place 0.4 mg under the tongue every 5 (five) minutes as needed for chest pain.     ondansetron 4 MG tablet  Commonly known as:  ZOFRAN  Take 4 mg by mouth every 8 (eight) hours as needed for nausea or vomiting.     OxyCODONE 20 mg T12a 12 hr tablet  Commonly known as:  OXYCONTIN  Take 20 mg by mouth every 12 (twelve) hours.     oxyCODONE-acetaminophen 5-325 MG per tablet  Commonly known as:  PERCOCET/ROXICET  Take 1-2 tablets by mouth every 6 (six) hours as needed for severe pain.     polyethylene glycol packet  Commonly known as:  MIRALAX / GLYCOLAX  Take 17 g by mouth daily as needed for mild constipation or moderate constipation.     ramelteon 8 MG tablet  Commonly known as:  ROZEREM  Take 8 mg by mouth at bedtime.       Allergies  Allergen Reactions  . Cefuroxime Axetil Shortness Of Breath, Swelling and Rash    Throat swelling  . Cephalexin Shortness Of Breath, Swelling and Rash    Throat swelling  . Clarithromycin Shortness Of Breath, Swelling and Rash    Throat swelling  . Doxycycline Shortness Of Breath, Swelling and Rash     rash on legs and feet, throat swelling  . Erythromycin Shortness Of Breath, Swelling and Rash    Throat swelling  . Penicillins Shortness Of Breath, Swelling and Rash    Throat swelling  . Tetracycline Shortness Of Breath, Swelling and Rash     Throat swelling   Follow-up Information   Follow up with [facility], MD. (1 week after discharged from SNF)    Specialty:  Family Medicine   Contact information:   [address] [place] [address] [phone]      Microbiology: Recent Results (from the past 240 hour(s))  CULTURE, BLOOD (ROUTINE X 2)     Status: None   Collection Time    07/15/13  8:34 PM      Result Value Range Status   Specimen Description BLOOD ARM RIGHT   Final   Special Requests BOTTLES DRAWN AEROBIC ONLY 3CC   Final   Culture  Setup Time     Final   Value: 07/16/2013 01:45     Performed at [facility]   Culture     Final   Value: NO GROWTH 5 DAYS     Performed at [facility]   Report Status 07/22/2013 FINAL   Final  CULTURE, BLOOD (ROUTINE X 2)     Status: None  Collection Time    07/15/13  8:45 PM      Result Value Range Status   Specimen Description BLOOD ARM LEFT   Final   Special Requests BOTTLES DRAWN AEROBIC ONLY 10CC   Final   Culture  Setup Time     Final   Value: 07/16/2013 01:45     Performed at [facility]   Culture     Final   Value: NO GROWTH 5 DAYS     Performed at [facility]   Report Status 07/22/2013 FINAL   Final     Labs: Basic Metabolic Panel:  Recent Labs Lab 07/18/13 0510 07/19/13 0500 07/20/13 0410 07/21/13 0427 07/22/13 0600  NA 133* 137 134* 136 136  K 4.5 4.9 3.8 3.7 3.8  CL 99 102 99 99 99  CO2 24 23 29 28 31   GLUCOSE 312* 322* 118* 191* 83  BUN 57* 44* 31* 19 15  CREATININE 1.52* 1.17* 1.03 0.91 0.95  CALCIUM 7.5* 7.7* 7.8* 7.7* 8.1*   Liver Function Tests:  Recent Labs Lab 07/16/13 0200 07/16/13 0335  AST 184* 170*  ALT 116* 116*  ALKPHOS 112 113  BILITOT 0.4 0.3  PROT 5.7* 5.8*  ALBUMIN 2.3* 2.2*   CBC:  Recent Labs Lab 07/15/13 1909  07/16/13 0335  07/18/13 0510 07/19/13 0754 07/20/13 0410 07/21/13 0427 07/22/13 0600  WBC 12.5*  --  11.4*  < > 8.6 11.7* 8.9 9.4 9.6  NEUTROABS 11.0*   --  10.2*  --   --   --   --   --   --   HGB 8.8*  < > 8.3*  < > 8.0* 8.7* 8.3* 8.2* 9.0*  HCT 27.4*  < > 25.1*  < > 24.4* 27.0* 26.3* 25.4* 28.1*  MCV 90.7  --  88.1  < > 88.1 89.1 90.1 89.8 89.8  PLT 268  --  258  < > 317 337 335 332 360  < > = values in this interval not displayed. Cardiac Enzymes:  Recent Labs Lab 07/16/13 0200  TROPONINI <0.30   BNP: BNP (last 3 results)  Recent Labs  06/07/13 1300 06/13/13 0406 07/15/13 2101  PROBNP 3554.0* 1503.0* 31366.0*   CBG:  Recent Labs Lab 07/21/13 1144 07/21/13 1613 07/21/13 2138 07/22/13 0813 07/22/13 1204  GLUCAP 215* 146* 114* 95 196*    Signed:  ELLIS,ALLISON L. ANP  [facility] 07/22/2013, 12:55 PM  I have personally examined this patient and reviewed the entire database. I have reviewed the above note, made any necessary editorial changes, and agree with its content.  Lonia Blood, MD [facility]

## 2013-07-21 NOTE — Progress Notes (Signed)
When preparing to transfer patient to 6E, PIV site was noted to be leaking.  Upon further examination, the IV site also appeared to be infiltrating and required removal.  Because this patient is a difficult stick, IV Team will be required to restart the IV.  They were paged at 1937 to be notified.  Will transfer patient to 6E and IV team can restart the IV there. Nicole Cella, RN

## 2013-07-22 DIAGNOSIS — D649 Anemia, unspecified: Secondary | ICD-10-CM

## 2013-07-22 DIAGNOSIS — I150 Renovascular hypertension: Secondary | ICD-10-CM | POA: Insufficient documentation

## 2013-07-22 LAB — CULTURE, BLOOD (ROUTINE X 2)
Culture: NO GROWTH
Culture: NO GROWTH

## 2013-07-22 LAB — BASIC METABOLIC PANEL
BUN: 15 mg/dL (ref 6–23)
CO2: 31 mEq/L (ref 19–32)
Calcium: 8.1 mg/dL — ABNORMAL LOW (ref 8.4–10.5)
Creatinine, Ser: 0.95 mg/dL (ref 0.50–1.10)
GFR calc non Af Amer: 56 mL/min — ABNORMAL LOW (ref >90)
Glucose, Bld: 83 mg/dL (ref 70–99)
Sodium: 136 mEq/L (ref 135–145)

## 2013-07-22 LAB — GLUCOSE, CAPILLARY
Glucose-Capillary: 95 mg/dL (ref 70–99)
Glucose-Capillary: 196 mg/dL — ABNORMAL HIGH (ref 70–99)
Glucose-Capillary: 165 mg/dL — ABNORMAL HIGH (ref 70–99)

## 2013-07-22 LAB — CBC
HCT: 28.1 % — ABNORMAL LOW (ref 36.0–46.0)
Hemoglobin: 9 g/dL — ABNORMAL LOW (ref 12.0–15.0)
MCH: 28.8 pg (ref 26.0–34.0)
MCHC: 32 g/dL (ref 30.0–36.0)
MCV: 89.8 fL (ref 78.0–100.0)
RBC: 3.13 MIL/uL — ABNORMAL LOW (ref 3.87–5.11)
WBC: 9.6 10*3/uL (ref 4.0–10.5)

## 2013-07-22 MED ORDER — ENSURE COMPLETE PO LIQD: 237.0000 mL | Freq: Two times a day (BID) | ORAL | Status: DC

## 2013-07-22 NOTE — Progress Notes (Signed)
I met with pt at bedside. I explained that beds limited on inpt rehab and no bed immediately available for admission for her. I recommended she look into other rehab venues. She is interested in [place] [facility]. I will discuss with RN CM and SW. [phone]

## 2013-07-22 NOTE — Progress Notes (Signed)
Physical Therapy Treatment Patient Details Name: [name] MRN: [id number] DOB: [birth year] Today's Date: 07/22/2013 Time: 1231-1250 PT Time Calculation (min): 19 min  PT Assessment / Plan / Recommendation  History of Present Illness Pt readmitted to hospital after dc home earlier in the day.  Unable to get out of car at home and went down to ground.  Returned to hospital via EMS. Pt found to have HCAP.   PT Comments   Making slow but steady progress with mobility and activity tolerance  Follow Up Recommendations  SNF     Does the patient have the potential to tolerate intense rehabilitation     Barriers to Discharge        Equipment Recommendations  None recommended by PT    Recommendations for Other Services    Frequency Min 3X/week   Progress towards PT Goals Progress towards PT goals: Progressing toward goals  Plan Current plan remains appropriate    Precautions / Restrictions Precautions Precautions: Fall   Pertinent Vitals/Pain Noted HR incr to 140s on telemetry, which coincided with pt reports of feeling tired;  HR decr back to 109 with seated rest     Mobility  Bed Mobility Details for Bed Mobility Assistance: OOB in chair upon arrival Transfers Transfers: Sit to Stand;Stand to Sit Sit to Stand: 3: Mod assist;From chair/3-in-1;With upper extremity assist Stand to Sit: 4: Min assist;To chair/3-in-1;With armrests Details for Transfer Assistance: Max verbal cues for hand placement and technique Ambulation/Gait Ambulation/Gait Assistance: 4: Min assist Ambulation Distance (Feet): 20 Feet Assistive device: Rolling walker Ambulation/Gait Assistance Details: cues for stepping and min assist with managing RW around obstacles; Pt demonstrated good self-monitor for activity tolerance; her reports of tiredness coincided with incr of HR to 140s on telemetry Gait Pattern: Step-through pattern;Decreased step length - left;Decreased step length -  right;Shuffle;Trunk flexed Gait velocity: decreased    Exercises     PT Diagnosis:    PT Problem List:   PT Treatment Interventions:     PT Goals (current goals can now be found in the care plan section) Acute Rehab PT Goals Patient Stated Goal: go home PT Goal Formulation: With patient Time For Goal Achievement: 07/23/13 Potential to Achieve Goals: Good  Visit Information  Last PT Received On: 07/22/13 Assistance Needed: +2 (helpful for O2 and push chair) History of Present Illness: Pt readmitted to hospital after dc home earlier in the day.  Unable to get out of car at home and went down to ground.  Returned to hospital via EMS. Pt found to have HCAP.    Subjective Data  Subjective: Reports weak Patient Stated Goal: go home   Cognition  Cognition Arousal/Alertness: Awake/alert Behavior During Therapy: WFL for tasks assessed/performed Overall Cognitive Status: Within Functional Limits for tasks assessed    Balance     End of Session PT - End of Session Equipment Utilized During Treatment: Gait belt;Oxygen Activity Tolerance: Patient limited by fatigue Patient left: in chair;with call bell/phone within reach Nurse Communication: Mobility status   GP     Van Clines [facility] [place], [place] [phone]  07/22/2013, 2:06 PM

## 2013-07-22 NOTE — Progress Notes (Signed)
07/22/2013 [facility] went [facility] via [facility], she is alert oriented when she left [facility]. Report was also given to facility. [facility] RN.

## 2013-07-22 NOTE — Progress Notes (Signed)
SUBJECTIVE:  Feels ok.  Walked with PT and heart rate increased to 140 per her report.  OBJECTIVE:   Vitals:   Filed Vitals:   07/22/13 0920 07/22/13 0948 07/22/13 1315 07/22/13 1430  BP: 119/54  149/72   Pulse: 98  79   Temp: 98.1 F (36.7 C)  98 F (36.7 C)   TempSrc: Oral  Oral   Resp: 16  18   Height:      Weight:      SpO2: 90% 94% 93% 94%   I&O's:    Intake/Output Summary (Last 24 hours) at 07/22/13 1802 Last data filed at 07/22/13 0900  Gross per 24 hour  Intake    700 ml  Output    250 ml  Net    450 ml        PHYSICAL EXAM General: Well developed, well nourished, in no acute distress Head: Eyes PERRLA, No xanthomas.   Normal cephalic and atramatic  Lungs: scattered rhonchi throughout Heart:   Irregularly irregular  S1 S2  Abdomen: Nondistended Extremities:   Trace lower extremity edema.  DP +1 Neuro: Alert and oriented X 3. Psych:  Normal affect, responds appropriately   LABS: Basic Metabolic Panel:  Recent Labs  78/29/56 0427 07/22/13 0600  NA 136 136  K 3.7 3.8  CL 99 99  CO2 28 31  GLUCOSE 191* 83  BUN 19 15  CREATININE 0.91 0.95  CALCIUM 7.7* 8.1*   Liver Function Tests: No results found for this basename: AST, ALT, ALKPHOS, BILITOT, PROT, ALBUMIN,  in the last 72 hours No results found for this basename: LIPASE, AMYLASE,  in the last 72 hours CBC:  Recent Labs  07/21/13 0427 07/22/13 0600  WBC 9.4 9.6  HGB 8.2* 9.0*  HCT 25.4* 28.1*  MCV 89.8 89.8  PLT 332 360   Cardiac Enzymes: No results found for this basename: CKTOTAL, CKMB, CKMBINDEX, TROPONINI,  in the last 72 hours BNP: No components found with this basename: POCBNP,  D-Dimer: No results found for this basename: DDIMER,  in the last 72 hours Hemoglobin A1C: No results found for this basename: HGBA1C,  in the last 72 hours Fasting Lipid Panel: No results found for this basename: CHOL, HDL, LDLCALC, TRIG, CHOLHDL, LDLDIRECT,  in the last 72 hours Thyroid Function  Tests: No results found for this basename: TSH, T4TOTAL, FREET3, T3FREE, THYROIDAB,  in the last 72 hours Anemia Panel: No results found for this basename: VITAMINB12, FOLATE, FERRITIN, TIBC, IRON, RETICCTPCT,  in the last 72 hours Coag Panel:   Lab Results  Component Value Date   INR 1.54* 07/15/2013   INR 1.19 06/10/2013   INR 1.26 06/09/2013    RADIOLOGY: Ct Abdomen Pelvis Wo Contrast  07/09/2013   CLINICAL DATA:  Abdominal pain. Nausea. Diarrhea. Acute renal failure.  EXAM: CT ABDOMEN AND PELVIS WITHOUT CONTRAST  TECHNIQUE: Multidetector CT imaging of the abdomen and pelvis was performed following the standard protocol without intravenous contrast.  COMPARISON:  05/20/2007  FINDINGS: Surgical clips noted from prior cholecystectomy. Noncontrast images of the liver, spleen, pancreas, and adrenal glands are normal in appearance. No evidence of renal calculi or hydronephrosis.  No soft tissue masses or lymphadenopathy identified within the abdomen or pelvis. Prior hysterectomy noted. Adnexal regions are unremarkable. Foley catheter seen within the bladder, and a pessary noted in the vagina. Sigmoid diverticulosis is noted, however there is no evidence of diverticulitis. No evidence of inflammatory process or abnormal fluid collections.  IMPRESSION: No acute  findings.  No evidence of hydronephrosis.  Diverticulosis. No radiographic evidence of diverticulitis.   Electronically Signed   By: Myles Rosenthal M.D.   On: 07/09/2013 22:42   Dg Chest 2 View  07/09/2013   CLINICAL DATA:  Abdominal pain with nausea and weakness, recent history of pneumonia  EXAM: CHEST  2 VIEW  COMPARISON:  CT scan of the chest of June 07, 2013.  FINDINGS: The lungs are well-expanded. The posterior right lower lobe infiltrate demonstrated on the previous CT scan is not evident on this chest x-ray. The cardiac silhouette is top-normal in size. The pulmonary vascularity is not engorged. There is no pleural effusion. There is no  pneumothorax. There is mild loss of height of multiple mid and lower thoracic vertebral bodies without evidence of high-grade which compression. The observed portions of the upper abdomen reveal no definite abnormal gas collection.  IMPRESSION: 1. There is no evidence of residual pneumonia in the right lower lobe. 2. There is borderline enlargement of cardiac silhouette without overt evidence of CHF.   Electronically Signed   By: David  [place]   On: 07/09/2013 16:49   Dg Chest Port 1 View  07/19/2013   CLINICAL DATA:  Hypoxia.  Shortness of breath.  EXAM: PORTABLE CHEST - 1 VIEW  COMPARISON:  07/15/2013 and 07/11/2013  FINDINGS: Small patchy infiltrates are present in the right upper lobe and at the right base but aeration is slightly improved. Slight atelectasis at the left base has improved. Heart size and pulmonary vascularity are within normal limits.  No acute osseous abnormality.  Small bilateral effusions.  IMPRESSION: Slight improvement in the patchy infiltrates on the right and atelectasis on the left.   Electronically Signed   By: Geanie Cooley M.D.   On: 07/19/2013 09:21   Dg Chest Port 1 View  07/15/2013   CLINICAL DATA:  Respiratory failure and fatigue. CHF. COPD. Diabetes.  EXAM: PORTABLE CHEST - 1 VIEW  COMPARISON:  07/11/2013  FINDINGS: Right glenohumeral joint arthroplasty. Cardiomegaly accentuated by AP portable technique. Advanced aortic atherosclerosis. Possible small pleural effusions. No pneumothorax. Patchy right upper and right lower lobe airspace disease. Minimal left base airspace disease.  IMPRESSION: Markedly worsened aeration, with right greater than left bilateral airspace disease. Favor infection or aspiration. Asymmetric pulmonary edema felt less likely.  Cardiomegaly with atherosclerosis and probable small bilateral pleural effusions.   Electronically Signed   By: Jeronimo Greaves M.D.   On: 07/15/2013 19:39   Dg Chest Port 1 View  07/11/2013   CLINICAL DATA:  Hypoxia.  EXAM:  PORTABLE CHEST - 1 VIEW  COMPARISON:  07/09/2013.  FINDINGS: Right lower lobe infiltrate has cleared. Minimal basilar atelectasis present. Heart size is stable. Pulmonary vascularity is normal. Right shoulder replacement. Degenerative changes left shoulder.  IMPRESSION: Previously identified right lower lobe infiltrate has cleared. Mild basilar atelectasis.   Electronically Signed   By: Maisie Fus  Register   On: 07/11/2013 15:04   Dg Swallowing Func-speech Pathology  07/17/2013   [name], CCC-SLP     07/17/2013  6:09 PM Objective Swallowing Evaluation: Modified Barium Swallowing Study   Patient Details  Name: [name] MRN: [id number] Date of Birth: 02-29-1934  Today's Date: 07/17/2013 Time: 8416-6063 SLP Time Calculation (min): 25 min  Past Medical History:  Past Medical History  Diagnosis Date  . CAD (coronary artery disease)   . CHF (congestive heart failure)   . Paroxysmal atrial fibrillation   . Bradycardia   . Hypertension   .  Renal artery stenosis   . COPD (chronic obstructive pulmonary disease)   . Obesity hypoventilation syndrome   . Pruritus   . Tracheobronchitis   . Acute rhinosinusitis   . Anemia   . Hypoxemia   . Peripheral edema   . Obesity, endogenous   . Diabetes mellitus   . Acid reflux disease   . Osteoarthritis, generalized   . Hypothyroidism   . Lung nodule     lingula  . OSA (obstructive sleep apnea)     NPSG 04/03/00--AHI 28.hr  . DVT (deep venous thrombosis) 2003    left leg  . Vaginal atrophy   . Hx of colonic polyps   . Acute diastolic heart failure   . Secondary pulmonary hypertension 06/09/2013   Past Surgical History:  Past Surgical History  Procedure Laterality Date  . Lumbar spine surgery    . Cholecystectomy    . Hemorroidectomy    . Repair wound fistula    . Right shoulder    . Elbow surgery    . Dilation and curettage of uterus    . Breast surgery  1998    lumpectomy  . Oophorectomy  1959    BSO-? ovarian cancer   HPI:  78 year old female admitted 07/15/13 due to  SOB, cough and  weakness.  Pt had been DC'd on 12/3 (following stay for PNA, N/V)  but was readmitted same day.  PMH significant for CAD, CHF, AFib,  HTN, OSA, COPD, DM, GERD, recurrent PNA.  BSE requested to  evaluate swallow function and safety.     Assessment / Plan / Recommendation Clinical Impression  Dysphagia Diagnosis: Mild oral phase dysphagia;Mild pharyngeal  phase dysphagia Clinical impression: Patient presents with mild oropharyngeal  dysphagia characterized by swallow initiation delay to vallecular  sinus,mild vallecular and posterior pharynx residuals, which  cleared with second swallow, and flash penetration of thin  liquids, which fully cleared without further incident. Patient  exhibited decreased anterior to posterior transit of solid  texture bolus. No aspiration observed during this MBSS.    Treatment Recommendation       Diet Recommendation Dysphagia 3 (Mechanical Soft);Thin liquid   Liquid Administration via: Cup;Straw Medication Administration: Whole meds with liquid Supervision: Patient able to self feed;Intermittent supervision  to cue for compensatory strategies Compensations: Slow rate;Small sips/bites;Follow solids with  liquid    Other  Recommendations Oral Care Recommendations: Oral care BID   Follow Up Recommendations  Inpatient Rehab    Frequency and Duration min 2x/week  1 week   Pertinent Vitals/Pain     SLP Swallow Goals   Patient will utilize recommended strategies during swallow to  increase swallowing safety with modified independence. Patient will tolerate trials of upgraded solids with no overt s/s  aspiration.    General Date of Onset: 07/15/13 HPI: 79 year old female admitted 07/15/13 due to SOB, cough and  weakness.  Pt had been DC'd on 12/3 (following stay for PNA, N/V)  but was readmitted same day.  PMH significant for CAD, CHF, AFib,  HTN, OSA, COPD, DM, GERD, recurrent PNA.  BSE requested to  evaluate swallow function and safety. Type of Study: Modified Barium Swallowing  Study Reason for Referral: Objectively evaluate swallowing function Previous Swallow Assessment: BSE Diet Prior to this Study: Dysphagia 3 (soft);Thin liquids Temperature Spikes Noted: No History of Recent Intubation: No Behavior/Cognition: Alert;Cooperative;Pleasant mood Oral Cavity - Dentition: Adequate natural dentition Self-Feeding Abilities: Able to feed self Patient Positioning: Upright in chair Baseline Vocal Quality:  Clear Volitional Cough: Strong Volitional Swallow: Able to elicit    Reason for Referral Objectively evaluate swallowing function   Oral Phase Oral Preparation/Oral Phase Oral Phase: Impaired Oral - Solids Oral - Regular: Delayed oral transit Oral Phase - Comment Oral Phase - Comment: delayed anterior to posterior transit of  regular solid bolus   Pharyngeal Phase Pharyngeal Phase Pharyngeal Phase: Impaired Pharyngeal - Thin Pharyngeal - Thin Cup: Penetration/Aspiration during swallow Penetration/Aspiration details (thin cup): Material enters  airway, remains ABOVE vocal cords then ejected out;Material  enters airway, remains ABOVE vocal cords and not ejected out Pharyngeal - Thin Straw: Penetration/Aspiration during swallow Pharyngeal - Solids Pharyngeal - Puree: Pharyngeal residue - valleculae (mild, clears  with second swallow) Pharyngeal - Regular: Delayed swallow initiation;Pharyngeal  residue - valleculae;Pharyngeal residue - posterior pharnyx Pharyngeal - Pill: Pharyngeal residue - valleculae Pharyngeal Phase - Comment Pharyngeal Comment: Pharyngeal residuals cleared with cued and  uncued second swallow performed by patient  Cervical Esophageal Phase    GO    Cervical Esophageal Phase Cervical Esophageal Phase: [name]         [name] 07/17/2013, 6:07 PM  Angela Nevin, MA, CCC-SLP [facility] Speech-Language Pathologist   ASSESSMENT:  1. Acute on chronic DHF aggravated by A fib and anemia. Improved since re-admission  2. A fib.with fairly good rate control HR 74-110bpm yesterday.   Heart rate increased with activity   Plan:  1. Continue diuresis and attempts at rate control.  Diuresis Limited by renal function. She will be going to rehabilitation later today. It will be important for them to check daily weights. If her weight increases, she will need an additional Lasix. This will be the most effective way to prevent readmission to the hospital. 2. Prognosis appears poor. 3. Continue Cardizem/metoprolol for rate control. She is not a good candidate for long-term anticoagulation given her anemia.    Corky Crafts., MD  07/22/2013  6:02 PM

## 2013-07-22 NOTE — Progress Notes (Signed)
Patient ID: [name], female   DOB: [birth year], 79 y.o.   MRN: [id number]        HISTORY & PHYSICAL  DATE: 06/17/2013     FACILITY: [facility]  LEVEL OF CARE: SNF (31)  ALLERGIES:  Allergies  Allergen Reactions  . Cefuroxime Axetil Shortness Of Breath, Swelling and Rash    Throat swelling  . Cephalexin Shortness Of Breath, Swelling and Rash    Throat swelling  . Clarithromycin Shortness Of Breath, Swelling and Rash    Throat swelling  . Doxycycline Shortness Of Breath, Swelling and Rash     rash on legs and feet, throat swelling  . Erythromycin Shortness Of Breath, Swelling and Rash    Throat swelling  . Penicillins Shortness Of Breath, Swelling and Rash    Throat swelling  . Tetracycline Shortness Of Breath, Swelling and Rash    Throat swelling    CHIEF COMPLAINT:  Manage diabetes mellitus, COPD, and CHF.    HISTORY OF PRESENT ILLNESS:  The patient is a 79 year-old, Caucasian female who was hospitalized for community-acquired pneumonia.  After hospitalization, she is admitted to this facility for short-term rehabilitation.  She has the following problems:    DM:pt's DM remains stable.  Pt denies polyuria, polydipsia, polyphagia, changes in vision or hypoglycemic episodes.  No complications noted from the medication presently being used.  Last hemoglobin A1c is:  Not available.    COPD: the COPD remains stable.  Pt denies sob, cough, wheezing or declining exercise tolerance.  No complications from the medications presently being used.    CHF:The patient does not relate significant weight changes, denies sob, DOE, orthopnea, PNDs, pedal edema, palpitations or chest pain.  CHF remains stable.  No complications form the medications being used.    PAST MEDICAL HISTORY :  Past Medical History  Diagnosis Date  . CAD (coronary artery disease)   . CHF (congestive heart failure)   . Paroxysmal atrial fibrillation   . Bradycardia   . Hypertension   .  Renal artery stenosis   . COPD (chronic obstructive pulmonary disease)   . Obesity hypoventilation syndrome   . Pruritus   . Tracheobronchitis   . Acute rhinosinusitis   . Anemia   . Hypoxemia   . Peripheral edema   . Obesity, endogenous   . Diabetes mellitus   . Acid reflux disease   . Osteoarthritis, generalized   . Hypothyroidism   . Lung nodule     lingula  . OSA (obstructive sleep apnea)     NPSG 04/03/00--AHI 28.hr  . DVT (deep venous thrombosis) 2003    left leg  . Vaginal atrophy   . Hx of colonic polyps   . Acute diastolic heart failure   . Secondary pulmonary hypertension 06/09/2013    PAST SURGICAL HISTORY: Past Surgical History  Procedure Laterality Date  . Lumbar spine surgery    . Cholecystectomy    . Hemorroidectomy    . Repair wound fistula    . Right shoulder    . Elbow surgery    . Dilation and curettage of uterus    . Breast surgery  1998    lumpectomy  . Oophorectomy  1959    BSO-? ovarian cancer    SOCIAL HISTORY:  reports that she quit smoking about 34 years ago. She has never used smokeless tobacco. She reports that she does not drink alcohol or use illicit drugs.  FAMILY HISTORY:  Family History  Problem  Relation Age of Onset  . Pneumonia Mother   . Heart disease Mother   . Allergies Mother   . Emphysema Mother   . Arthritis Mother   . Heart attack Father   . Stroke Sister   . Diabetes Sister   . Heart disease Sister   . Diabetes Brother   . Diabetes      grandfather  . Clotting disorder Brother   . Arthritis Sister   . Breast cancer      aunt  . Bone cancer      aunt    CURRENT MEDICATIONS: Reviewed per [facility]  REVIEW OF SYSTEMS:  See HPI otherwise 14 point ROS is negative.  PHYSICAL EXAMINATION  VS:  T 98.1       P 66      RR 18      BP 154/66      POX 96%        WT (Lb)  GENERAL: no acute distress, moderately obese body habitus EYES: conjunctivae normal, sclerae normal, normal eye lids MOUTH/THROAT: lips without  lesions,no lesions in the mouth,tongue is without lesions,uvula elevates in midline NECK: supple, trachea midline, no neck masses, no thyroid tenderness, no thyromegaly LYMPHATICS: no LAN in the neck, no supraclavicular LAN RESPIRATORY: breathing is even & unlabored, BS CTAB CARDIAC: RRR, no murmur,no extra heart sounds EDEMA/VARICOSITIES: left lower extremity has +1 edema   GI:  ABDOMEN: abdomen soft, normal BS, no masses, no tenderness  LIVER/SPLEEN: no hepatomegaly, no splenomegaly MUSCULOSKELETAL: HEAD: normal to inspection & palpation BACK: no kyphosis, scoliosis or spinal processes tenderness EXTREMITIES: LEFT UPPER EXTREMITY: full range of motion, normal strength & tone RIGHT UPPER EXTREMITY:  full range of motion, normal strength & tone LEFT LOWER EXTREMITY: strength intact, range of motion moderate   RIGHT LOWER EXTREMITY: strength intact, range of motion moderate    PSYCHIATRIC: the patient is alert & oriented to person, affect & behavior appropriate  LABS/RADIOLOGY: 2D-echo:  Showed EF of 65-70%, grade 2 diastolic dysfunction.    Chest x-ray:  No acute findings.      CT of the head:  No acute abnormalities.    CT of the chest:  Consolidation in the right lower lobe with new pathologic mediastinal adenopathy, appearance of bacterial pneumonia.    Blood culture x2 showed no growth.    Glucose 157, BUN 44, creatinine 1.81, otherwise BMP normal.    Hemoglobin 9.6, MCV 87, otherwise CBC normal.    Labs reviewed: Basic Metabolic Panel:  Recent Labs  16/10/96 0504 07/11/13 1550 07/12/13 0410 07/13/13 0606  07/20/13 0410 07/21/13 0427 07/22/13 0600  NA 133*  --  137 138  < > 134* 136 136  K 3.0* 4.0 3.9 4.3  < > 3.8 3.7 3.8  CL 93*  --  100 102  < > 99 99 99  CO2 27  --  28 26  < > 29 28 31   GLUCOSE 100*  --  125* 145*  < > 118* 191* 83  BUN 95*  --  76* 50*  < > 31* 19 15  CREATININE 4.22*  --  2.56* 1.53*  < > 1.03 0.91 0.95  CALCIUM 6.0*  --  7.2* 7.8*  < >  7.8* 7.7* 8.1*  MG 1.2* 1.7 1.7  --   --   --   --   --   PHOS  --   --  4.2 3.1  --   --   --   --   < > =  values in this interval not displayed. Liver Function Tests:  Recent Labs  07/10/13 0435  07/13/13 0606 07/16/13 0200 07/16/13 0335  AST 19  --   --  184* 170*  ALT 25  --   --  116* 116*  ALKPHOS 120*  --   --  112 113  BILITOT 0.2*  --   --  0.4 0.3  PROT 5.7*  --   --  5.7* 5.8*  ALBUMIN 2.4*  < > 2.4* 2.3* 2.2*  < > = values in this interval not displayed. No results found for this basename: LIPASE, AMYLASE,  in the last 8760 hours No results found for this basename: AMMONIA,  in the last 8760 hours CBC:  Recent Labs  07/10/13 0435  07/15/13 1909  07/16/13 0335  07/20/13 0410 07/21/13 0427 07/22/13 0600  WBC 7.1  < > 12.5*  --  11.4*  < > 8.9 9.4 9.6  NEUTROABS 5.7  --  11.0*  --  10.2*  --   --   --   --   HGB 9.1*  < > 8.8*  < > 8.3*  < > 8.3* 8.2* 9.0*  HCT 26.7*  < > 27.4*  < > 25.1*  < > 26.3* 25.4* 28.1*  MCV 85.9  < > 90.7  --  88.1  < > 90.1 89.8 89.8  PLT 267  < > 268  --  258  < > 335 332 360  < > = values in this interval not displayed.   Recent Labs  06/08/13 0535 07/09/13 2230 07/16/13 0200  CKTOTAL  --  57  --   TROPONINI <0.30 <0.30 <0.30   BNP: No components found with this basename: POCBNP,  CBG:  Recent Labs  07/21/13 2138 07/22/13 0813 07/22/13 1204  GLUCAP 114* 95 196*   ASSESSMENT/PLAN:  Diabetes mellitus with renal complications.  Check hemoglobin A1c.    COPD.  Well compensated.    CHF.  Well compensated.    Renovascular hypertension.  Blood pressure elevated.  We will review a log.    Atrial fibrillation.  Rate controlled.    CAD.  Status post PTCA and stenting.    Check CBC with diff and BMP.    Hypothyroidism.  Continue levothyroxine.  Check TSH.    I have reviewed patient's medical records received at admission/from hospitalization.  CPT CODE: 16109

## 2013-07-23 ENCOUNTER — Other Ambulatory Visit: Payer: Self-pay | Admitting: *Deleted

## 2013-07-23 MED ORDER — OXYCODONE-ACETAMINOPHEN 5-325 MG PO TABS: 1.0000 | ORAL_TABLET | Freq: Four times a day (QID) | ORAL | Status: DC | PRN

## 2013-07-24 ENCOUNTER — Non-Acute Institutional Stay (SKILLED_NURSING_FACILITY): Payer: Medicare Other | Admitting: Adult Health

## 2013-07-24 ENCOUNTER — Telehealth: Payer: Self-pay | Admitting: *Deleted

## 2013-07-24 DIAGNOSIS — I5033 Acute on chronic diastolic (congestive) heart failure: Secondary | ICD-10-CM

## 2013-07-24 DIAGNOSIS — I4891 Unspecified atrial fibrillation: Secondary | ICD-10-CM

## 2013-07-24 DIAGNOSIS — N318 Other neuromuscular dysfunction of bladder: Secondary | ICD-10-CM

## 2013-07-24 DIAGNOSIS — E1129 Type 2 diabetes mellitus with other diabetic kidney complication: Secondary | ICD-10-CM

## 2013-07-24 DIAGNOSIS — N3281 Overactive bladder: Secondary | ICD-10-CM

## 2013-07-24 DIAGNOSIS — J189 Pneumonia, unspecified organism: Secondary | ICD-10-CM

## 2013-07-24 DIAGNOSIS — E039 Hypothyroidism, unspecified: Secondary | ICD-10-CM

## 2013-07-24 DIAGNOSIS — F411 Generalized anxiety disorder: Secondary | ICD-10-CM

## 2013-07-24 DIAGNOSIS — M810 Age-related osteoporosis without current pathological fracture: Secondary | ICD-10-CM

## 2013-07-24 DIAGNOSIS — J449 Chronic obstructive pulmonary disease, unspecified: Secondary | ICD-10-CM

## 2013-07-24 DIAGNOSIS — F419 Anxiety disorder, unspecified: Secondary | ICD-10-CM

## 2013-07-24 DIAGNOSIS — E119 Type 2 diabetes mellitus without complications: Secondary | ICD-10-CM

## 2013-07-24 DIAGNOSIS — K5900 Constipation, unspecified: Secondary | ICD-10-CM

## 2013-07-24 NOTE — Telephone Encounter (Signed)
Called [facility] and let them know pt is no longer on Amlodipine.

## 2013-07-24 NOTE — Telephone Encounter (Signed)
A representative from [facility] called to verify current amlodipine dose for patient. He stated that they have 10mg  and 5mg  on file for the patient. Since some of the med lists are not correct in epic and I dont have access to [place] chart, I will send to Amy for review. Please call back at [phone] reference # [phone]. Thanks, MI

## 2013-07-28 ENCOUNTER — Other Ambulatory Visit: Payer: Self-pay | Admitting: *Deleted

## 2013-07-28 MED ORDER — ZOLPIDEM TARTRATE 5 MG PO TABS: ORAL_TABLET | ORAL | Status: DC

## 2013-07-29 ENCOUNTER — Non-Acute Institutional Stay (SKILLED_NURSING_FACILITY): Payer: Medicare Other | Admitting: Internal Medicine

## 2013-07-29 DIAGNOSIS — F419 Anxiety disorder, unspecified: Secondary | ICD-10-CM | POA: Insufficient documentation

## 2013-07-29 DIAGNOSIS — R748 Abnormal levels of other serum enzymes: Secondary | ICD-10-CM

## 2013-07-29 DIAGNOSIS — I2510 Atherosclerotic heart disease of native coronary artery without angina pectoris: Secondary | ICD-10-CM

## 2013-07-29 DIAGNOSIS — M810 Age-related osteoporosis without current pathological fracture: Secondary | ICD-10-CM | POA: Insufficient documentation

## 2013-07-29 DIAGNOSIS — J441 Chronic obstructive pulmonary disease with (acute) exacerbation: Secondary | ICD-10-CM

## 2013-07-29 DIAGNOSIS — K5900 Constipation, unspecified: Secondary | ICD-10-CM | POA: Insufficient documentation

## 2013-07-29 DIAGNOSIS — N3281 Overactive bladder: Secondary | ICD-10-CM | POA: Insufficient documentation

## 2013-07-29 DIAGNOSIS — E039 Hypothyroidism, unspecified: Secondary | ICD-10-CM

## 2013-07-29 NOTE — Progress Notes (Addendum)
Patient ID: [name], female   DOB: Jan 08, 1934, 79 y.o.   MRN: [id number]                         PROGRESS NOTE  DATE: 07/24/2013  FACILITY: Nursing Home Location: [facility]  LEVEL OF CARE: SNF (31)  Acute Visit  CHIEF COMPLAINT: Follow-up hospitalization   HISTORY OF PRESENT ILLNESS: The 63s a 79 year old female who has been admitted to [facility] on 07/22/13 from [facility] with acute on chronic diastolic heart failure and healthcare acquired pneumonia (HCAP). She has been admitted for a short-term rehabilitation.  REASSESSMENT OF ONGOING PROBLEM(S):  DM:pt's DM remains stable.  Pt denies polyuria, polydipsia, polyphagia, changes in vision or hypoglycemic episodes.  No complications noted from the medication presently being used. 12/14  hemoglobin A1c 7.7  HYPOTHYROIDISM: The hypothyroidism remains stable. No complications noted from the medications presently being used.  The patient denies fatigue or constipation.    ATRIAL FIBRILLATION: the patients atrial fibrillation remains stable.  The patient denies DOE, tachycardia, orthopnea, transient neurological sx, pedal edema, palpitations, & PNDs.  No complications noted from the medications currently being used.  PAST MEDICAL HISTORY : Reviewed.  No changes.  CURRENT MEDICATIONS: Reviewed per [facility]  REVIEW OF SYSTEMS:  GENERAL: no change in appetite, no fatigue, no weight changes, no fever, chills or weakness RESPIRATORY: no cough, SOB, DOE, wheezing, hemoptysis CARDIAC: no chest pain, edema or palpitations GI: no abdominal pain, diarrhea, constipation, heart burn, nausea or vomiting  PHYSICAL EXAMINATION  VS:  T97.2       P93      RR18      BP136/70     POX95 %     WT175.6 (Lb)  GENERAL: no acute distress, normal body habitus EYES: conjunctivae normal, sclerae normal, normal eye lids NECK: supple, trachea midline, no neck masses, no thyroid tenderness, no thyromegaly LYMPHATICS: no  LAN in the neck, no supraclavicular LAN RESPIRATORY: breathing is even & unlabored, BS CTAB CARDIAC: irregularly irregular hear rate, no murmur,no extra heart sounds, no edema GI: abdomen soft, normal BS, no masses, no tenderness, no hepatomegaly, no splenomegaly PSYCHIATRIC: the patient is alert & oriented to person, anxious  LABS/RADIOLOGY: 07/22/13 WBC 9.6 hemoglobin 9.0 hematocrit 28.1 sodium 136 potassium 3.8 glucose 83 BUN 15 creatinine 0.95 calcium 8.1 07/12/13 TSH 2.997 06/07/13 hemoglobin A1c 7.7   ASSESSMENT/PLAN:  Acute on chronic diastolic heart failure - stable, continue Lasix  HCAP - continue Levaquin  Diabetes mellitus, type II - well controlled; continue Humalog and Lantus  Osteoporosis - continue Boniva  Hypothyroidism - well controlled; continue Synthroid  Constipation - no complaints  Overactive bladder - continue Ditropan  COPD - continue Pulmicort  Anxiety - increase Ativan to 0.5 mg 1 tab by mouth twice a day   CPT CODE: 19147

## 2013-07-31 ENCOUNTER — Ambulatory Visit (INDEPENDENT_AMBULATORY_CARE_PROVIDER_SITE_OTHER): Payer: Medicare Other | Admitting: Interventional Cardiology

## 2013-07-31 ENCOUNTER — Encounter: Payer: Self-pay | Admitting: Interventional Cardiology

## 2013-07-31 VITALS — BP 120/66 | HR 135 | Ht 60.0 in | Wt 174.0 lb

## 2013-07-31 DIAGNOSIS — R0902 Hypoxemia: Secondary | ICD-10-CM

## 2013-07-31 DIAGNOSIS — I5033 Acute on chronic diastolic (congestive) heart failure: Secondary | ICD-10-CM

## 2013-07-31 DIAGNOSIS — J9621 Acute and chronic respiratory failure with hypoxia: Secondary | ICD-10-CM

## 2013-07-31 DIAGNOSIS — I4891 Unspecified atrial fibrillation: Secondary | ICD-10-CM

## 2013-07-31 DIAGNOSIS — J9620 Acute and chronic respiratory failure, unspecified whether with hypoxia or hypercapnia: Secondary | ICD-10-CM

## 2013-07-31 DIAGNOSIS — I10 Essential (primary) hypertension: Secondary | ICD-10-CM

## 2013-07-31 MED ORDER — METOPROLOL TARTRATE 50 MG PO TABS: ORAL_TABLET | ORAL | Status: DC

## 2013-07-31 MED ORDER — FUROSEMIDE 40 MG PO TABS: ORAL_TABLET | ORAL | Status: DC

## 2013-07-31 NOTE — Progress Notes (Signed)
Patient ID: [name], female   DOB: 1933-10-13, 79 y.o.   MRN: [id number]    [address] [place], [place]  [address] Phone: [phone] Fax:  [phone]  Date:  07/31/2013   ID:  [name], DOB 01-15-1934, MRN [id number]  PCP:  Gweneth Dimitri, MD      History of Present Illness: TIAUNNA [name] is a 79 y.o. female woman with known coronary artery disease and paroxysmal atrial fibrillation. Over the past few months, she has had more chronic atrial fibrillation. She has had some shortness of breath as well. She feels some mild fluid retention. She has chronic lung disease and requires oxygen. She had a repeat catheterization after her stents a few years ago and her LAD was widely patent.  She has had several hospitalizations.  She is now in rehab.   Her HR has been high when she goes to rehab.      Wt Readings from Last 3 Encounters:  07/31/13 174 lb (78.926 kg)  07/21/13 190 lb 11.2 oz (86.501 kg)  07/15/13 194 lb 10.7 oz (88.3 kg)     Past Medical History  Diagnosis Date  . CAD (coronary artery disease)   . CHF (congestive heart failure)   . Paroxysmal atrial fibrillation   . Bradycardia   . Hypertension   . Renal artery stenosis   . COPD (chronic obstructive pulmonary disease)   . Obesity hypoventilation syndrome   . Pruritus   . Tracheobronchitis   . Acute rhinosinusitis   . Anemia   . Hypoxemia   . Peripheral edema   . Obesity, endogenous   . Diabetes mellitus   . Acid reflux disease   . Osteoarthritis, generalized   . Hypothyroidism   . Lung nodule     lingula  . OSA (obstructive sleep apnea)     NPSG 04/03/00--AHI 28.hr  . DVT (deep venous thrombosis) 2003    left leg  . Vaginal atrophy   . Hx of colonic polyps   . Acute diastolic heart failure   . Secondary pulmonary hypertension 06/09/2013    Current Outpatient Prescriptions  Medication Sig Dispense Refill  . acetaminophen (TYLENOL) 325 MG tablet Take 2  tablets (650 mg total) by mouth every 6 (six) hours as needed for mild pain or headache.      . allopurinol (ZYLOPRIM) 100 MG tablet Take 1 tablet (100 mg total) by mouth daily.      Marland Kitchen azelastine (ASTELIN) 137 MCG/SPRAY nasal spray Place 2 sprays into the nose daily. Use in each nostril as directed  90 mL  3  . budesonide (PULMICORT) 0.25 MG/2ML nebulizer solution Take 2 mLs (0.25 mg total) by nebulization 2 (two) times daily.  60 mL  12  . clopidogrel (PLAVIX) 75 MG tablet Take 75 mg by mouth daily.       . cyclobenzaprine (FLEXERIL) 10 MG tablet Take 10 mg by mouth daily.       Marland Kitchen diltiazem (CARDIZEM CD) 360 MG 24 hr capsule Take 1 capsule (360 mg total) by mouth daily.  30 capsule  1  . feeding supplement, ENSURE COMPLETE, (ENSURE COMPLETE) LIQD Take 237 mLs by mouth 2 (two) times daily between meals.      . feeding supplement, GLUCERNA SHAKE, (GLUCERNA SHAKE) LIQD Take 237 mLs by mouth 3 (three) times daily between meals.    0  . furosemide (LASIX) 40 MG tablet Take 1 tablet (40 mg total) by mouth 2 (two) times  daily.  30 tablet  0  . guaiFENesin (MUCINEX) 600 MG 12 hr tablet Take 600 mg by mouth 2 (two) times daily as needed.      . ibandronate (BONIVA) 150 MG tablet Take 150 mg by mouth every 30 (thirty) days. Take in the morning with a full glass of water, on an empty stomach, and do not take anything else by mouth or lie down for the next 30 min. (on or about the 25th of each month)      . insulin aspart (NOVOLOG) 100 UNIT/ML injection Check CBG before meals. CBG 121-150 give 2 units  CBG 151-200 give 3 units CBG 201-250 give 5 units CBG 251-300 give 8 units CBG 301-350 give 11 units CBG 351-400 give 15 units If CBG > 400 CALL MD  1 vial  12  . insulin aspart (NOVOLOG) 100 UNIT/ML injection Check CBG at HS CBG 201-250 give 2 units CBG 251-300 give 3 units CBG 301-350 give 4 units CBG 351-400 give 5 units For CBG > 400 CALL MD  1 vial  12  . insulin glargine (LANTUS) 100 UNIT/ML  injection Inject 0.2 mLs (20 Units total) into the skin daily.  10 mL  12  . isosorbide mononitrate (IMDUR) 15 mg TB24 24 hr tablet Take 7.5 mg by mouth daily.      Marland Kitchen levalbuterol (XOPENEX) 0.63 MG/3ML nebulizer solution Take 3 mLs (0.63 mg total) by nebulization every 6 (six) hours as needed for wheezing or shortness of breath.  3 mL  12  . levofloxacin (LEVAQUIN) 500 MG tablet Take 500 mg by mouth daily.      Marland Kitchen levothyroxine (SYNTHROID, LEVOTHROID) 25 MCG tablet Take 25 mcg by mouth See admin instructions. Take 1 tablet (25 mcg) on Monday, Wednesday, Friday (take 50 mcg on Tues, Thurs, Sat, Sun)      . levothyroxine (SYNTHROID, LEVOTHROID) 50 MCG tablet Take 50 mcg by mouth See admin instructions. Take 1 tablet (50 mcg) on Tuesday, Thursday, Saturday, Sunday (take 25 mcg on Monday, Wednesday and Friday)      . LORazepam (ATIVAN) 0.5 MG tablet Take 0.25 mg by mouth 3 (three) times daily.      . metoprolol tartrate (LOPRESSOR) 25 MG tablet Take 1 tablet (25 mg total) by mouth 2 (two) times daily.      . metroNIDAZOLE (FLAGYL) 500 MG tablet Take 500 mg by mouth every 8 (eight) hours as needed.      . nitroGLYCERIN (NITROSTAT) 0.4 MG SL tablet Place 0.4 mg under the tongue every 5 (five) minutes as needed for chest pain.       Marland Kitchen ondansetron (ZOFRAN) 4 MG tablet Take 4 mg by mouth every 8 (eight) hours as needed for nausea or vomiting.      Marland Kitchen oxybutynin (DITROPAN XL) 10 MG 24 hr tablet Take 10 mg by mouth daily.       . OxyCODONE (OXYCONTIN) 20 mg T12A 12 hr tablet Take 20 mg by mouth every 12 (twelve) hours.      . polyethylene glycol (MIRALAX / GLYCOLAX) packet Take 17 g by mouth daily as needed for mild constipation or moderate constipation.  14 each  0  . ramelteon (ROZEREM) 8 MG tablet Take 8 mg by mouth at bedtime.      Marland Kitchen zolpidem (AMBIEN) 5 MG tablet Take one tablet by mouth every night at bedtime as needed for sleep  30 tablet  5  . influenza vac split quadrivalent PF (FLUARIX) 0.5 ML injection  Inject 0.5 mLs into the muscle once.      Marland Kitchen oxyCODONE-acetaminophen (PERCOCET/ROXICET) 5-325 MG per tablet Take 1-2 tablets by mouth every 6 (six) hours as needed for severe pain.  240 tablet  0   No current facility-administered medications for this visit.    Allergies:    Allergies  Allergen Reactions  . Cefuroxime Axetil Shortness Of Breath, Swelling and Rash    Throat swelling  . Cephalexin Shortness Of Breath, Swelling and Rash    Throat swelling  . Clarithromycin Shortness Of Breath, Swelling and Rash    Throat swelling  . Doxycycline Shortness Of Breath, Swelling and Rash     rash on legs and feet, throat swelling  . Erythromycin Shortness Of Breath, Swelling and Rash    Throat swelling  . Penicillins Shortness Of Breath, Swelling and Rash    Throat swelling  . Tetracycline Shortness Of Breath, Swelling and Rash    Throat swelling    Social History:  The patient  reports that she quit smoking about 34 years ago. She has never used smokeless tobacco. She reports that she does not drink alcohol or use illicit drugs.   Family History:  The patient's family history includes Allergies in her mother; Arthritis in her mother and sister; Bone cancer in an other family member; Breast cancer in an other family member; Clotting disorder in her brother; Diabetes in her brother, sister, and another family member; Emphysema in her mother; Heart attack in her father; Heart disease in her mother and sister; Pneumonia in her mother; Stroke in her sister.   ROS:  Please see the history of present illness.  No nausea, vomiting.  No fevers, chills.  No focal weakness.  No dysuria.    All other systems reviewed and negative.   PHYSICAL EXAM: VS:  BP 120/66  Pulse 135  Ht 5' (1.524 m)  Wt 174 lb (78.926 kg)  BMI 33.98 kg/m2 Well nourished, well developed, in no acute distress HEENT: normal Neck: no JVD, no carotid bruits Cardiac:  normal S1, S2; irregularly irregular, tachycardic Lungs:   clear to auscultation bilaterally, no wheezing, rhonchi or rales Abd: soft, nontender, no hepatomegaly Ext: Trace pretibial edemaBilaterally Skin: warm and dry Neuro:   no focal abnormalities noted  EKG:  Atrial fibrillation, nonspecific ST segment changes, rapid ventricular response     ASSESSMENT AND PLAN:  Atrial fibrillation  off of Sotalol HCl Tablet, 80 MG, 1 tablet, Orally, Twice a day due to renal insufficiency ; did not tolerate amiodarone due to nausea  IMAGING: EKG    Overton,Shana 04/22/2013 03:54:33 PM > Rayshell Goecke,JAY 04/22/2013 04:15:14 PM > NSR, no significant ST segment changes   Notes: Increase metoprolol to 50 mg by mouth twice a day for additional rate control. Continue diltiazem 360 mg daily.     2. Hypertension, essential  Continue Clonidine HCl Tablet, 0.1 MG, 1 tablet, Orally, Twice a day as needed Notes: prn CLonidine seems to help BP in addition to other scheduled meds. Renal artery stenosis in the past. Patient Educated with: [facility] BP Action plan.pdf ([facility] BP Action plan.pdf) Patient Educated with: Hypertension ExitCare.pdf (Hypertension ExitCare.pdf).    3. Edema  Notes: likely related to right heart failure from lung disease; diastolic heart failure as well.   can increase Lasix to 40 mg by mouth twice a day with a third dose when necessary.   4. Coronary atherosclerosis of native coronary artery  Notes: No angina. Nonsignificant RCA disease by FFR in 2012. COuld  hold plavix for 5 days if back surgery needed since stents are nearly 79 years old.    Preventive Medicine  Adult topics discussed:  Diet: healthy diet.      Signed, Fredric Mare, MD, [facility] 07/31/2013 12:47 PM

## 2013-07-31 NOTE — Patient Instructions (Signed)
Your physician has recommended you make the following change in your medication:   1. Increase Metoprolol to 50 mg twice a day.  2. Can take an extra lasix 40 mg tablet if weight increases more than 2lbs on any given day.   Your physician wants you to follow-up in: 4 months with Dr. Eldridge Dace. You will receive a reminder letter in the mail two months in advance. If you don't receive a letter, please call our office to schedule the follow-up appointment.

## 2013-08-02 ENCOUNTER — Encounter: Payer: Self-pay | Admitting: Internal Medicine

## 2013-08-02 DIAGNOSIS — R748 Abnormal levels of other serum enzymes: Secondary | ICD-10-CM | POA: Insufficient documentation

## 2013-08-02 NOTE — Progress Notes (Signed)
HISTORY & PHYSICAL  DATE: 07/29/2013   FACILITY: [facility]  LEVEL OF CARE: SNF (31)  ALLERGIES:  Allergies  Allergen Reactions  . Cefuroxime Axetil Shortness Of Breath, Swelling and Rash    Throat swelling  . Cephalexin Shortness Of Breath, Swelling and Rash    Throat swelling  . Clarithromycin Shortness Of Breath, Swelling and Rash    Throat swelling  . Doxycycline Shortness Of Breath, Swelling and Rash     rash on legs and feet, throat swelling  . Erythromycin Shortness Of Breath, Swelling and Rash    Throat swelling  . Penicillins Shortness Of Breath, Swelling and Rash    Throat swelling  . Tetracycline Shortness Of Breath, Swelling and Rash    Throat swelling    CHIEF COMPLAINT:  Manage CAD, hypothyroidism and COPD  HISTORY OF PRESENT ILLNESS: Patient is a 79 year old Caucasian female who was hospitalized secondary to acute respiratory failure. After hospitalization she is admitted to this facility for short-term rehabilitation.  CAD: The angina has been stable. The patient denies dyspnea on exertion, orthopnea, pedal edema, palpitations and paroxysmal nocturnal dyspnea. No complications noted from the medication presently being used.  COPD: the COPD remains stable.  Pt denies sob, cough, wheezing or declining exercise tolerance.  No complications from the medications presently being used.  HYPOTHYROIDISM: The hypothyroidism remains stable. No complications noted from the medications presently being used.  The patient denies fatigue or constipation.  Last TSH 2.9, free T4-1.35 in 11-14.  PAST MEDICAL HISTORY :  Past Medical History  Diagnosis Date  . CAD (coronary artery disease)   . CHF (congestive heart failure)   . Paroxysmal atrial fibrillation   . Bradycardia   . Hypertension   . Renal artery stenosis   . COPD (chronic obstructive pulmonary disease)   . Obesity hypoventilation syndrome   . Pruritus   . Tracheobronchitis   . Acute  rhinosinusitis   . Anemia   . Hypoxemia   . Peripheral edema   . Obesity, endogenous   . Diabetes mellitus   . Acid reflux disease   . Osteoarthritis, generalized   . Hypothyroidism   . Lung nodule     lingula  . OSA (obstructive sleep apnea)     NPSG 04/03/00--AHI 28.hr  . DVT (deep venous thrombosis) 2003    left leg  . Vaginal atrophy   . Hx of colonic polyps   . Acute diastolic heart failure   . Secondary pulmonary hypertension 06/09/2013    PAST SURGICAL HISTORY: Past Surgical History  Procedure Laterality Date  . Lumbar spine surgery    . Cholecystectomy    . Hemorroidectomy    . Repair wound fistula    . Right shoulder    . Elbow surgery    . Dilation and curettage of uterus    . Breast surgery  1998    lumpectomy  . Oophorectomy  1959    BSO-? ovarian cancer    SOCIAL HISTORY:  reports that she quit smoking about 34 years ago. She has never used smokeless tobacco. She reports that she does not drink alcohol or use illicit drugs.  FAMILY HISTORY:  Family History  Problem Relation Age of Onset  . Pneumonia Mother   . Heart disease Mother   . Allergies Mother   . Emphysema Mother   . Arthritis Mother   . Heart attack Father   . Stroke Sister   . Diabetes Sister   . Heart disease Sister   .  Diabetes Brother   . Diabetes      grandfather  . Clotting disorder Brother   . Arthritis Sister   . Breast cancer      aunt  . Bone cancer      aunt    CURRENT MEDICATIONS: Reviewed per [facility]  REVIEW OF SYSTEMS:  See HPI otherwise 14 point ROS is negative.  PHYSICAL EXAMINATION  VS:  T 97.1       P 88       RR 18       BP 127/79      POX% 97 on 2 L        GENERAL: no acute distress, moderately obese body habitus EYES: conjunctivae normal, sclerae normal, normal eye lids MOUTH/THROAT: lips without lesions,no lesions in the mouth,tongue is without lesions,uvula elevates in midline NECK: supple, trachea midline, no neck masses, no thyroid tenderness, no  thyromegaly LYMPHATICS: no LAN in the neck, no supraclavicular LAN RESPIRATORY: breathing is even & unlabored, BS CTAB CARDIAC: Heart rate is irregularly irregular, no murmur,no extra heart sounds, no edema GI:  ABDOMEN: abdomen soft, normal BS, no masses, no tenderness  LIVER/SPLEEN: no hepatomegaly, no splenomegaly MUSCULOSKELETAL: HEAD: normal to inspection & palpation BACK: no kyphosis, scoliosis or spinal processes tenderness EXTREMITIES: LEFT UPPER EXTREMITY: full range of motion, normal strength & tone RIGHT UPPER EXTREMITY:  full range of motion, normal strength & tone LEFT LOWER EXTREMITY:  Moderate range of motion, normal strength & tone RIGHT LOWER EXTREMITY:  Moderate range of motion, normal strength & tone PSYCHIATRIC: the patient is alert & oriented to person, affect & behavior appropriate  LABS/RADIOLOGY:  Labs reviewed: Basic Metabolic Panel:  Recent Labs  16/10/96 0504 07/11/13 1550 07/12/13 0410 07/13/13 0606  07/20/13 0410 07/21/13 0427 07/22/13 0600  NA 133*  --  137 138  < > 134* 136 136  K 3.0* 4.0 3.9 4.3  < > 3.8 3.7 3.8  CL 93*  --  100 102  < > 99 99 99  CO2 27  --  28 26  < > 29 28 31   GLUCOSE 100*  --  125* 145*  < > 118* 191* 83  BUN 95*  --  76* 50*  < > 31* 19 15  CREATININE 4.22*  --  2.56* 1.53*  < > 1.03 0.91 0.95  CALCIUM 6.0*  --  7.2* 7.8*  < > 7.8* 7.7* 8.1*  MG 1.2* 1.7 1.7  --   --   --   --   --   PHOS  --   --  4.2 3.1  --   --   --   --   < > = values in this interval not displayed. Liver Function Tests:  Recent Labs  07/10/13 0435  07/13/13 0606 07/16/13 0200 07/16/13 0335  AST 19  --   --  184* 170*  ALT 25  --   --  116* 116*  ALKPHOS 120*  --   --  112 113  BILITOT 0.2*  --   --  0.4 0.3  PROT 5.7*  --   --  5.7* 5.8*  ALBUMIN 2.4*  < > 2.4* 2.3* 2.2*  < > = values in this interval not displayed.  CBC:  Recent Labs  07/10/13 0435  07/15/13 1909  07/16/13 0335  07/20/13 0410 07/21/13 0427 07/22/13 0600   WBC 7.1  < > 12.5*  --  11.4*  < > 8.9 9.4 9.6  NEUTROABS 5.7  --  11.0*  --  10.2*  --   --   --   --   HGB 9.1*  < > 8.8*  < > 8.3*  < > 8.3* 8.2* 9.0*  HCT 26.7*  < > 27.4*  < > 25.1*  < > 26.3* 25.4* 28.1*  MCV 85.9  < > 90.7  --  88.1  < > 90.1 89.8 89.8  PLT 267  < > 268  --  258  < > 335 332 360  < > = values in this interval not displayed.  Cardiac Enzymes:  Recent Labs  06/08/13 0535 07/09/13 2230 07/16/13 0200  CKTOTAL  --  57  --   TROPONINI <0.30 <0.30 <0.30    CBG:  Recent Labs  07/22/13 0813 07/22/13 1204 07/22/13 1639  GLUCAP 95 196* 165*   Chest x-ray-slight improvement in the patchy infiltrates on the right Blood culture x2 showed no growth  ASSESSMENT/PLAN:  CAD-stable Hypothyroidism-well-controlled COPD-well compensated Elevated liver functions-recheck Pneumonia-continue Levaquin CHF-well compensated Diabetes mellitus-continue current medications Atrial fibrillation-rate controlled Check CBC  I have reviewed patient's medical records received at admission/from hospitalization.  CPT CODE: 16109

## 2013-08-03 ENCOUNTER — Non-Acute Institutional Stay (SKILLED_NURSING_FACILITY): Payer: Medicare Other | Admitting: Adult Health

## 2013-08-03 DIAGNOSIS — I509 Heart failure, unspecified: Secondary | ICD-10-CM

## 2013-08-03 DIAGNOSIS — M810 Age-related osteoporosis without current pathological fracture: Secondary | ICD-10-CM

## 2013-08-03 DIAGNOSIS — E1129 Type 2 diabetes mellitus with other diabetic kidney complication: Secondary | ICD-10-CM

## 2013-08-03 DIAGNOSIS — N3281 Overactive bladder: Secondary | ICD-10-CM

## 2013-08-03 DIAGNOSIS — E039 Hypothyroidism, unspecified: Secondary | ICD-10-CM

## 2013-08-03 DIAGNOSIS — F411 Generalized anxiety disorder: Secondary | ICD-10-CM

## 2013-08-03 DIAGNOSIS — J189 Pneumonia, unspecified organism: Secondary | ICD-10-CM

## 2013-08-03 DIAGNOSIS — I4891 Unspecified atrial fibrillation: Secondary | ICD-10-CM

## 2013-08-03 DIAGNOSIS — F419 Anxiety disorder, unspecified: Secondary | ICD-10-CM

## 2013-08-03 DIAGNOSIS — J449 Chronic obstructive pulmonary disease, unspecified: Secondary | ICD-10-CM

## 2013-08-03 DIAGNOSIS — N318 Other neuromuscular dysfunction of bladder: Secondary | ICD-10-CM

## 2013-08-07 ENCOUNTER — Other Ambulatory Visit: Payer: Self-pay | Admitting: Interventional Cardiology

## 2013-08-07 ENCOUNTER — Telehealth: Payer: Self-pay | Admitting: Internal Medicine

## 2013-08-07 MED ORDER — LEVOFLOXACIN 500 MG PO TABS: 500.0000 mg | ORAL_TABLET | Freq: Every day | ORAL | Status: DC

## 2013-08-07 NOTE — Telephone Encounter (Signed)
Pt calling again in ref to previous msg.[name] ° ° °

## 2013-08-07 NOTE — Telephone Encounter (Signed)
Called and spoke with pt and she stated that she has been in and out of the hospital and rehab.  She stated that she has 1 pill left of the levaquin and she is still coughing and wheezing and feels that she needs another round to completely clear this all up.  CY please advise if ok to refill the levaquin 500mg .     Last ov --04/23/2013 Next ov --10/22/2013  Allergies  Allergen Reactions  . Cefuroxime Axetil Shortness Of Breath, Swelling and Rash    Throat swelling  . Cephalexin Shortness Of Breath, Swelling and Rash    Throat swelling  . Clarithromycin Shortness Of Breath, Swelling and Rash    Throat swelling  . Doxycycline Shortness Of Breath, Swelling and Rash     rash on legs and feet, throat swelling  . Erythromycin Shortness Of Breath, Swelling and Rash    Throat swelling  . Penicillins Shortness Of Breath, Swelling and Rash    Throat swelling  . Tetracycline Shortness Of Breath, Swelling and Rash    Throat swelling     Current Outpatient Prescriptions on File Prior to Visit  Medication Sig Dispense Refill  . acetaminophen (TYLENOL) 325 MG tablet Take 2 tablets (650 mg total) by mouth every 6 (six) hours as needed for mild pain or headache.      . allopurinol (ZYLOPRIM) 100 MG tablet Take 1 tablet (100 mg total) by mouth daily.      Marland Kitchen azelastine (ASTELIN) 137 MCG/SPRAY nasal spray Place 2 sprays into the nose daily. Use in each nostril as directed  90 mL  3  . budesonide (PULMICORT) 0.25 MG/2ML nebulizer solution Take 2 mLs (0.25 mg total) by nebulization 2 (two) times daily.  60 mL  12  . clopidogrel (PLAVIX) 75 MG tablet Take 75 mg by mouth daily.       . cyclobenzaprine (FLEXERIL) 10 MG tablet Take 10 mg by mouth daily.       Marland Kitchen diltiazem (CARDIZEM CD) 360 MG 24 hr capsule Take 1 capsule (360 mg total) by mouth daily.  30 capsule  1  . feeding supplement, ENSURE COMPLETE, (ENSURE COMPLETE) LIQD Take 237 mLs by mouth 2 (two) times daily between meals.      . feeding  supplement, GLUCERNA SHAKE, (GLUCERNA SHAKE) LIQD Take 237 mLs by mouth 3 (three) times daily between meals.    0  . furosemide (LASIX) 40 MG tablet 1 tablet by mouth twice a day and an extra tablet prn for weight gain of 2 lbs  30 tablet  0  . guaiFENesin (MUCINEX) 600 MG 12 hr tablet Take 600 mg by mouth 2 (two) times daily as needed.      . ibandronate (BONIVA) 150 MG tablet Take 150 mg by mouth every 30 (thirty) days. Take in the morning with a full glass of water, on an empty stomach, and do not take anything else by mouth or lie down for the next 30 min. (on or about the 25th of each month)      . influenza vac split quadrivalent PF (FLUARIX) 0.5 ML injection Inject 0.5 mLs into the muscle once.      . insulin aspart (NOVOLOG) 100 UNIT/ML injection Check CBG before meals. CBG 121-150 give 2 units  CBG 151-200 give 3 units CBG 201-250 give 5 units CBG 251-300 give 8 units CBG 301-350 give 11 units CBG 351-400 give 15 units If CBG > 400 CALL MD  1 vial  12  .  insulin glargine (LANTUS) 100 UNIT/ML injection Inject 0.2 mLs (20 Units total) into the skin daily.  10 mL  12  . isosorbide mononitrate (IMDUR) 15 mg TB24 24 hr tablet Take 7.5 mg by mouth daily.      Marland Kitchen levalbuterol (XOPENEX) 0.63 MG/3ML nebulizer solution Take 3 mLs (0.63 mg total) by nebulization every 6 (six) hours as needed for wheezing or shortness of breath.  3 mL  12  . levofloxacin (LEVAQUIN) 500 MG tablet Take 500 mg by mouth daily.      Marland Kitchen levothyroxine (SYNTHROID, LEVOTHROID) 25 MCG tablet Take 25 mcg by mouth See admin instructions. Take 1 tablet (25 mcg) on Monday, Wednesday, Friday (take 50 mcg on Tues, Thurs, Sat, Sun)      . levothyroxine (SYNTHROID, LEVOTHROID) 50 MCG tablet Take 50 mcg by mouth See admin instructions. Take 1 tablet (50 mcg) on Tuesday, Thursday, Saturday, Sunday (take 25 mcg on Monday, Wednesday and Friday)      . LORazepam (ATIVAN) 0.5 MG tablet Take 0.25 mg by mouth 3 (three) times daily.      .  metoprolol tartrate (LOPRESSOR) 50 MG tablet 1 tablet by mouth twice a day      . metroNIDAZOLE (FLAGYL) 500 MG tablet Take 500 mg by mouth every 8 (eight) hours as needed.      . nitroGLYCERIN (NITROSTAT) 0.4 MG SL tablet Place 0.4 mg under the tongue every 5 (five) minutes as needed for chest pain.       Marland Kitchen ondansetron (ZOFRAN) 4 MG tablet Take 4 mg by mouth every 8 (eight) hours as needed for nausea or vomiting.      Marland Kitchen oxybutynin (DITROPAN XL) 10 MG 24 hr tablet Take 10 mg by mouth daily.       . OxyCODONE (OXYCONTIN) 20 mg T12A 12 hr tablet Take 20 mg by mouth every 12 (twelve) hours.      Marland Kitchen oxyCODONE-acetaminophen (PERCOCET/ROXICET) 5-325 MG per tablet Take 1-2 tablets by mouth every 6 (six) hours as needed for severe pain.  240 tablet  0  . polyethylene glycol (MIRALAX / GLYCOLAX) packet Take 17 g by mouth daily as needed for mild constipation or moderate constipation.  14 each  0  . ramelteon (ROZEREM) 8 MG tablet Take 8 mg by mouth at bedtime.      Marland Kitchen zolpidem (AMBIEN) 5 MG tablet Take one tablet by mouth every night at bedtime as needed for sleep  30 tablet  5   No current facility-administered medications on file prior to visit.

## 2013-08-07 NOTE — Telephone Encounter (Signed)
Called, spoke with pt.  She was calling to check on the status of this.  Ensured pt we would call her back before leaving today with a response.  She verbalized understanding and is ok with this.  Dr. Maple Hudson, pls advise.  Thank you.

## 2013-08-07 NOTE — Telephone Encounter (Signed)
Per Cy-yes okay to refill Levaquin as she previously had.

## 2013-08-07 NOTE — Telephone Encounter (Signed)
Spoke with pt and aware RX has been called in. Nothing further needed 

## 2013-08-07 NOTE — Telephone Encounter (Signed)
Rx request for diltiazem, looks as if it was prescribed in hospital, no notes from 12/19 OV of med order for it. Please advise.

## 2013-08-14 ENCOUNTER — Other Ambulatory Visit: Payer: Self-pay

## 2013-08-14 ENCOUNTER — Telehealth: Payer: Self-pay

## 2013-08-17 MED ORDER — FUROSEMIDE 40 MG PO TABS: ORAL_TABLET | ORAL | Status: DC

## 2013-08-17 MED ORDER — CLOPIDOGREL BISULFATE 75 MG PO TABS: 75.0000 mg | ORAL_TABLET | Freq: Every day | ORAL | Status: DC

## 2013-08-17 MED ORDER — DILTIAZEM HCL ER COATED BEADS 360 MG PO CP24: 360.0000 mg | ORAL_CAPSULE | Freq: Every day | ORAL | Status: DC

## 2013-08-17 MED ORDER — ISOSORBIDE MONONITRATE 15 MG HALF TABLET: 7.5000 mg | ORAL_TABLET | Freq: Every day | ORAL | Status: DC

## 2013-08-17 MED ORDER — METOPROLOL TARTRATE 50 MG PO TABS: ORAL_TABLET | ORAL | Status: DC

## 2013-08-17 MED ORDER — ISOSORBIDE MONONITRATE ER 30 MG PO TB24: ORAL_TABLET | ORAL | Status: DC

## 2013-08-17 NOTE — Telephone Encounter (Signed)
Rx's printed and signed. Pt aware they will be up front for her to pick up.

## 2013-08-25 ENCOUNTER — Encounter (INDEPENDENT_AMBULATORY_CARE_PROVIDER_SITE_OTHER): Payer: Medicare Other

## 2013-08-25 ENCOUNTER — Telehealth: Payer: Self-pay | Admitting: *Deleted

## 2013-08-25 ENCOUNTER — Encounter: Payer: Self-pay | Admitting: *Deleted

## 2013-08-25 DIAGNOSIS — I4891 Unspecified atrial fibrillation: Secondary | ICD-10-CM

## 2013-08-25 NOTE — Progress Notes (Signed)
Patient ID: [name], female   DOB: 01/29/1934, 80 y.o.   MRN: [id number] [employer] 30 day cardiac event monitor applied to patient.

## 2013-08-25 NOTE — Telephone Encounter (Signed)
[employer] placed a 30 Day Event monitor on patient today. [employer] called with a Critical EKG of AFib with HR 80-100 bpm. They will fax EKG over to office. Dr. Martinique reviewed phone call and last office notes. Will route to Dr. Irish Lack.

## 2013-08-25 NOTE — Telephone Encounter (Signed)
Known AFib

## 2013-08-28 DIAGNOSIS — J189 Pneumonia, unspecified organism: Secondary | ICD-10-CM | POA: Insufficient documentation

## 2013-08-28 NOTE — Progress Notes (Signed)
Patient ID: [name], female   DOB: 03/28/1934, 80 y.o.   MRN: [id number]                          PROGRESS NOTE  DATE:  08/03/13  FACILITY: Nursing Home Location: [facility]  LEVEL OF CARE: SNF (31)  Acute Visit  CHIEF COMPLAINT: Discharge Notes  HISTORY OF PRESENT ILLNESS: This is a 80 year old female who is for discharge home with Home health PT, OT and Nursing. She has been admitted to [facility] on 07/22/13 from [facility] with acute on chronic diastolic heart failure and healthcare acquired pneumonia (HCAP). Patient was admitted to this facility for short-term rehabilitation after the patient's recent hospitalization.  Patient has completed SNF rehabilitation and therapy has cleared the patient for discharge.    REASSESSMENT OF ONGOING PROBLEM(S):  CHF:The patient does not relate significant weight changes, denies sob, DOE, orthopnea, PNDs, pedal edema, palpitations or chest pain.  CHF remains stable.  No complications form the medications being used.  COPD: the COPD remains stable.  Pt denies sob, cough, wheezing or declining exercise tolerance.  No complications from the medications presently being used.  DM:pt's DM remains stable.  Pt denies polyuria, polydipsia, polyphagia, changes in vision or hypoglycemic episodes.  No complications noted from the medication presently being used. 12/14  hemoglobin A1c 7.7   PAST MEDICAL HISTORY : Reviewed.  No changes.  CURRENT MEDICATIONS: Reviewed per [facility]  REVIEW OF SYSTEMS:  GENERAL: no change in appetite, no fatigue, no weight changes, no fever, chills or weakness RESPIRATORY: no cough, SOB, DOE, wheezing, hemoptysis CARDIAC: no chest pain, edema or palpitations GI: no abdominal pain, diarrhea, constipation, heart burn, nausea or vomiting  PHYSICAL EXAMINATION  VS:  T98.4      P100      RR20   BP138/82    POX97 %     WT167.6 (Lb)  GENERAL: no acute distress, normal body habitus NECK:  supple, trachea midline, no neck masses, no thyroid tenderness, no thyromegaly LYMPHATICS: no LAN in the neck, no supraclavicular LAN RESPIRATORY: breathing is even & unlabored, BS CTAB CARDIAC: irregularly irregular hear rate, no murmur,no extra heart sounds, no edema GI: abdomen soft, normal BS, no masses, no tenderness, no hepatomegaly, no splenomegaly PSYCHIATRIC: the patient is alert & oriented to person, anxious  LABS/RADIOLOGY: 07/22/13 WBC 9.6 hemoglobin 9.0 hematocrit 28.1 sodium 136 potassium 3.8 glucose 83 BUN 15 creatinine 0.95 calcium 8.1 07/12/13 TSH 2.997 06/07/13 hemoglobin A1c 7.7   ASSESSMENT/PLAN:  Acute on chronic diastolic heart failure - stable, continue Lasix  Pneumonitis - continue Levaquin 500 mg PO daily x 4 more days  Diabetes mellitus, type II - well controlled; continue Humalog and Lantus  Osteoporosis - continue Boniva  Hypothyroidism - well controlled; continue Synthroid  Constipation - no complaints  Overactive bladder - continue Ditropan  COPD - continue Pulmicort  Anxiety - stable; continue Ativan   I have filled out patient's discharge paperwork and written prescriptions.  Patient will receive home health PT, OT and Nursing .  Total discharge time: Greater than 30 minutes Discharge time involved coordination of the discharge process with social worker, nursing staff and therapy department. Medical justification for home health services verified.   CPT CODE: 26834

## 2013-09-04 ENCOUNTER — Ambulatory Visit: Payer: Medicare Other | Admitting: Obstetrics and Gynecology

## 2013-09-09 ENCOUNTER — Encounter (HOSPITAL_COMMUNITY): Payer: Self-pay | Admitting: Emergency Medicine

## 2013-09-09 ENCOUNTER — Inpatient Hospital Stay (HOSPITAL_COMMUNITY)
Admission: EM | Admit: 2013-09-09 | Discharge: 2013-09-12 | DRG: 291 | Disposition: A | Discharge status: Home Health | Payer: Medicare Other | Attending: Internal Medicine | Admitting: Internal Medicine

## 2013-09-09 ENCOUNTER — Emergency Department (HOSPITAL_COMMUNITY): Payer: Medicare Other

## 2013-09-09 DIAGNOSIS — J4489 Other specified chronic obstructive pulmonary disease: Secondary | ICD-10-CM | POA: Diagnosis present

## 2013-09-09 DIAGNOSIS — Z794 Long term (current) use of insulin: Secondary | ICD-10-CM

## 2013-09-09 DIAGNOSIS — IMO0002 Reserved for concepts with insufficient information to code with codable children: Secondary | ICD-10-CM | POA: Diagnosis present

## 2013-09-09 DIAGNOSIS — R5381 Other malaise: Secondary | ICD-10-CM

## 2013-09-09 DIAGNOSIS — I509 Heart failure, unspecified: Secondary | ICD-10-CM | POA: Diagnosis present

## 2013-09-09 DIAGNOSIS — E662 Morbid (severe) obesity with alveolar hypoventilation: Secondary | ICD-10-CM | POA: Diagnosis present

## 2013-09-09 DIAGNOSIS — E43 Unspecified severe protein-calorie malnutrition: Secondary | ICD-10-CM

## 2013-09-09 DIAGNOSIS — E1129 Type 2 diabetes mellitus with other diabetic kidney complication: Secondary | ICD-10-CM

## 2013-09-09 DIAGNOSIS — J9621 Acute and chronic respiratory failure with hypoxia: Secondary | ICD-10-CM

## 2013-09-09 DIAGNOSIS — N19 Unspecified kidney failure: Secondary | ICD-10-CM

## 2013-09-09 DIAGNOSIS — K219 Gastro-esophageal reflux disease without esophagitis: Secondary | ICD-10-CM

## 2013-09-09 DIAGNOSIS — M109 Gout, unspecified: Secondary | ICD-10-CM | POA: Diagnosis present

## 2013-09-09 DIAGNOSIS — E039 Hypothyroidism, unspecified: Secondary | ICD-10-CM | POA: Diagnosis present

## 2013-09-09 DIAGNOSIS — I5033 Acute on chronic diastolic (congestive) heart failure: Principal | ICD-10-CM | POA: Diagnosis present

## 2013-09-09 DIAGNOSIS — D649 Anemia, unspecified: Secondary | ICD-10-CM | POA: Diagnosis present

## 2013-09-09 DIAGNOSIS — J449 Chronic obstructive pulmonary disease, unspecified: Secondary | ICD-10-CM

## 2013-09-09 DIAGNOSIS — J189 Pneumonia, unspecified organism: Secondary | ICD-10-CM

## 2013-09-09 DIAGNOSIS — E1165 Type 2 diabetes mellitus with hyperglycemia: Secondary | ICD-10-CM | POA: Diagnosis present

## 2013-09-09 DIAGNOSIS — G4733 Obstructive sleep apnea (adult) (pediatric): Secondary | ICD-10-CM | POA: Diagnosis present

## 2013-09-09 DIAGNOSIS — I4891 Unspecified atrial fibrillation: Secondary | ICD-10-CM | POA: Diagnosis present

## 2013-09-09 DIAGNOSIS — Z86718 Personal history of other venous thrombosis and embolism: Secondary | ICD-10-CM

## 2013-09-09 DIAGNOSIS — J9620 Acute and chronic respiratory failure, unspecified whether with hypoxia or hypercapnia: Secondary | ICD-10-CM | POA: Diagnosis present

## 2013-09-09 DIAGNOSIS — I2510 Atherosclerotic heart disease of native coronary artery without angina pectoris: Secondary | ICD-10-CM | POA: Diagnosis present

## 2013-09-09 DIAGNOSIS — R1310 Dysphagia, unspecified: Secondary | ICD-10-CM | POA: Diagnosis present

## 2013-09-09 DIAGNOSIS — E349 Endocrine disorder, unspecified: Secondary | ICD-10-CM | POA: Diagnosis present

## 2013-09-09 DIAGNOSIS — Z7902 Long term (current) use of antithrombotics/antiplatelets: Secondary | ICD-10-CM

## 2013-09-09 DIAGNOSIS — E119 Type 2 diabetes mellitus without complications: Secondary | ICD-10-CM | POA: Diagnosis present

## 2013-09-09 DIAGNOSIS — Z6832 Body mass index (BMI) 32.0-32.9, adult: Secondary | ICD-10-CM

## 2013-09-09 DIAGNOSIS — Z87891 Personal history of nicotine dependence: Secondary | ICD-10-CM

## 2013-09-09 DIAGNOSIS — I10 Essential (primary) hypertension: Secondary | ICD-10-CM | POA: Diagnosis present

## 2013-09-09 LAB — TROPONIN I: Troponin I: <0.3 ng/mL (ref <0.30)

## 2013-09-09 LAB — CBC
HCT: 31.1 % — ABNORMAL LOW (ref 36.0–46.0)
Hemoglobin: 9.9 g/dL — ABNORMAL LOW (ref 12.0–15.0)
MCH: 28.9 pg (ref 26.0–34.0)
MCHC: 31.8 g/dL (ref 30.0–36.0)
MCV: 90.7 fL (ref 78.0–100.0)
PLATELETS: 285 10*3/uL (ref 150–400)
RBC: 3.43 MIL/uL — ABNORMAL LOW (ref 3.87–5.11)
RDW: 18.5 % — AB (ref 11.5–15.5)
WBC: 6.1 10*3/uL (ref 4.0–10.5)

## 2013-09-09 LAB — BASIC METABOLIC PANEL
BUN: 21 mg/dL (ref 6–23)
CALCIUM: 9.3 mg/dL (ref 8.4–10.5)
CO2: 27 mEq/L (ref 19–32)
CREATININE: 1.1 mg/dL (ref 0.50–1.10)
Chloride: 96 mEq/L (ref 96–112)
GFR, EST AFRICAN AMERICAN: 55 mL/min — AB (ref >90)
GFR, EST NON AFRICAN AMERICAN: 47 mL/min — AB (ref >90)
Glucose, Bld: 213 mg/dL — ABNORMAL HIGH (ref 70–99)
Potassium: 4.5 mEq/L (ref 3.7–5.3)
Sodium: 137 mEq/L (ref 137–147)

## 2013-09-09 LAB — POCT I-STAT TROPONIN I: Troponin i, poc: 0.01 ng/mL (ref 0.00–0.08)

## 2013-09-09 LAB — PRO B NATRIURETIC PEPTIDE: PRO B NATRI PEPTIDE: 8058 pg/mL — AB (ref 0–450)

## 2013-09-09 MED ORDER — LORAZEPAM 0.5 MG PO TABS
0.2500 mg | ORAL_TABLET | Freq: Two times a day (BID) | ORAL | Status: DC | PRN
  Administered 2013-09-09 – 2013-09-10 (×2): 0.25 mg via ORAL
  Filled 2013-09-09 (×2): qty 1

## 2013-09-09 MED ORDER — AZELASTINE HCL 0.1 % NA SOLN
2.0000 | Freq: Every day | NASAL | Status: DC
  Administered 2013-09-10 – 2013-09-12 (×3): 2 via NASAL
  Filled 2013-09-09: qty 30

## 2013-09-09 MED ORDER — COLCHICINE 0.6 MG PO TABS
0.6000 mg | ORAL_TABLET | Freq: Every day | ORAL | Status: DC
  Administered 2013-09-10 – 2013-09-12 (×3): 0.6 mg via ORAL
  Filled 2013-09-09 (×4): qty 1

## 2013-09-09 MED ORDER — ONDANSETRON HCL 4 MG PO TABS: 4.0000 mg | ORAL_TABLET | Freq: Four times a day (QID) | ORAL | Status: DC | PRN

## 2013-09-09 MED ORDER — ZOLPIDEM TARTRATE 5 MG PO TABS
5.0000 mg | ORAL_TABLET | Freq: Every evening | ORAL | Status: DC | PRN
  Administered 2013-09-11: 5 mg via ORAL
  Filled 2013-09-09: qty 1

## 2013-09-09 MED ORDER — DILTIAZEM HCL ER COATED BEADS 360 MG PO CP24
360.0000 mg | ORAL_CAPSULE | Freq: Every day | ORAL | Status: DC
  Administered 2013-09-10 – 2013-09-12 (×3): 360 mg via ORAL
  Filled 2013-09-09 (×3): qty 1

## 2013-09-09 MED ORDER — ALBUTEROL SULFATE (2.5 MG/3ML) 0.083% IN NEBU
2.5000 mg | INHALATION_SOLUTION | RESPIRATORY_TRACT | Status: DC
  Administered 2013-09-09: 2.5 mg via RESPIRATORY_TRACT
  Filled 2013-09-09 (×7): qty 3

## 2013-09-09 MED ORDER — FUROSEMIDE 10 MG/ML IJ SOLN
60.0000 mg | Freq: Once | INTRAMUSCULAR | Status: AC
  Administered 2013-09-10: 60 mg via INTRAVENOUS

## 2013-09-09 MED ORDER — ACETAMINOPHEN 325 MG PO TABS
650.0000 mg | ORAL_TABLET | Freq: Four times a day (QID) | ORAL | Status: DC | PRN
  Administered 2013-09-09 – 2013-09-11 (×5): 650 mg via ORAL
  Filled 2013-09-09 (×5): qty 2

## 2013-09-09 MED ORDER — ONDANSETRON HCL 4 MG/2ML IJ SOLN: 4.0000 mg | Freq: Four times a day (QID) | INTRAMUSCULAR | Status: DC | PRN

## 2013-09-09 MED ORDER — LEVOTHYROXINE SODIUM 25 MCG PO TABS
25.0000 ug | ORAL_TABLET | Freq: Every day | ORAL | Status: DC
  Administered 2013-09-10 – 2013-09-12 (×3): 25 ug via ORAL
  Filled 2013-09-09 (×4): qty 1

## 2013-09-09 MED ORDER — CLOPIDOGREL BISULFATE 75 MG PO TABS
75.0000 mg | ORAL_TABLET | Freq: Every day | ORAL | Status: DC
  Administered 2013-09-10 – 2013-09-12 (×3): 75 mg via ORAL
  Filled 2013-09-09 (×3): qty 1

## 2013-09-09 MED ORDER — ISOSORBIDE MONONITRATE ER 30 MG PO TB24
30.0000 mg | ORAL_TABLET | Freq: Every day | ORAL | Status: DC
  Administered 2013-09-10 – 2013-09-12 (×3): 30 mg via ORAL
  Filled 2013-09-09 (×3): qty 1

## 2013-09-09 MED ORDER — INSULIN ASPART 100 UNIT/ML ~~LOC~~ SOLN: 0.0000 [IU] | Freq: Every day | SUBCUTANEOUS | Status: DC

## 2013-09-09 MED ORDER — ENOXAPARIN SODIUM 40 MG/0.4ML ~~LOC~~ SOLN
40.0000 mg | SUBCUTANEOUS | Status: DC
  Administered 2013-09-09 – 2013-09-11 (×3): 40 mg via SUBCUTANEOUS
  Filled 2013-09-09 (×4): qty 0.4

## 2013-09-09 MED ORDER — OXYBUTYNIN CHLORIDE ER 10 MG PO TB24
10.0000 mg | ORAL_TABLET | Freq: Every day | ORAL | Status: DC
  Administered 2013-09-10 – 2013-09-12 (×3): 10 mg via ORAL
  Filled 2013-09-09 (×4): qty 1

## 2013-09-09 MED ORDER — INSULIN GLARGINE 100 UNIT/ML ~~LOC~~ SOLN
20.0000 [IU] | Freq: Every day | SUBCUTANEOUS | Status: DC
  Administered 2013-09-10 – 2013-09-12 (×3): 20 [IU] via SUBCUTANEOUS
  Filled 2013-09-09 (×4): qty 0.2

## 2013-09-09 MED ORDER — FUROSEMIDE 10 MG/ML IJ SOLN
80.0000 mg | Freq: Once | INTRAMUSCULAR | Status: AC
  Administered 2013-09-09: 80 mg via INTRAVENOUS
  Filled 2013-09-09: qty 8

## 2013-09-09 MED ORDER — NITROGLYCERIN 0.4 MG SL SUBL: 0.4000 mg | SUBLINGUAL_TABLET | SUBLINGUAL | Status: DC | PRN

## 2013-09-09 MED ORDER — ALLOPURINOL 100 MG PO TABS
100.0000 mg | ORAL_TABLET | Freq: Every day | ORAL | Status: DC
  Administered 2013-09-10 – 2013-09-12 (×3): 100 mg via ORAL
  Filled 2013-09-09 (×4): qty 1

## 2013-09-09 MED ORDER — ONDANSETRON HCL 4 MG PO TABS: 4.0000 mg | ORAL_TABLET | Freq: Three times a day (TID) | ORAL | Status: DC | PRN

## 2013-09-09 MED ORDER — BUDESONIDE 0.25 MG/2ML IN SUSP
0.2500 mg | Freq: Two times a day (BID) | RESPIRATORY_TRACT | Status: DC
  Administered 2013-09-10 – 2013-09-12 (×5): 0.25 mg via RESPIRATORY_TRACT
  Filled 2013-09-09 (×8): qty 2

## 2013-09-09 MED ORDER — FUROSEMIDE 40 MG PO TABS
40.0000 mg | ORAL_TABLET | Freq: Two times a day (BID) | ORAL | Status: DC
  Administered 2013-09-10 – 2013-09-12 (×4): 40 mg via ORAL
  Filled 2013-09-09 (×6): qty 1

## 2013-09-09 MED ORDER — METOPROLOL TARTRATE 50 MG PO TABS
50.0000 mg | ORAL_TABLET | Freq: Two times a day (BID) | ORAL | Status: DC
  Administered 2013-09-09 – 2013-09-12 (×6): 50 mg via ORAL
  Filled 2013-09-09 (×7): qty 1

## 2013-09-09 MED ORDER — INSULIN ASPART 100 UNIT/ML ~~LOC~~ SOLN
0.0000 [IU] | Freq: Three times a day (TID) | SUBCUTANEOUS | Status: DC
  Administered 2013-09-10: 5 [IU] via SUBCUTANEOUS
  Administered 2013-09-10 (×2): 3 [IU] via SUBCUTANEOUS
  Administered 2013-09-11: 2 [IU] via SUBCUTANEOUS
  Administered 2013-09-11: 3 [IU] via SUBCUTANEOUS
  Administered 2013-09-11: 2 [IU] via SUBCUTANEOUS
  Administered 2013-09-12 (×2): 3 [IU] via SUBCUTANEOUS

## 2013-09-09 NOTE — ED Notes (Signed)
Admitting physician at bedside

## 2013-09-09 NOTE — ED Notes (Signed)
C/o sob, dull R sided chest pain that radiates to R arm, and nausea since Sunday.  Pt seen by PCP and sent to ED for ? Pneumonia.

## 2013-09-09 NOTE — ED Provider Notes (Signed)
CSN: [id number]     Arrival date & time 09/09/13  1631 History   First MD Initiated Contact with Patient 09/09/13 1843     Chief Complaint  Patient presents with  . Shortness of Breath  . Chest Pain   (Consider location/radiation/quality/duration/timing/severity/associated sxs/prior Treatment) Patient is a 80 y.o. female presenting with shortness of breath and chest pain. The history is provided by the patient and a relative.  Shortness of Breath Associated symptoms: chest pain   Chest Pain Associated symptoms: shortness of breath    patient here complaining of 3 day history of orthopnea and dyspnea on exertion. She has had a nonproductive cough without fever. Increased lower extremity edema. Seen by her physician today and had an x-ray done in the office which showed possible CHF versus pneumonia. She denies any vomiting or diarrhea. Symptoms are worse with exertion. She's been compliant with her medications. Has had one loose stool today and some nausea without severe vomiting. No treatment used prior to arrival. She does have a history of CHF as well as community-acquired pneumonia.  Past Medical History  Diagnosis Date  . CAD (coronary artery disease)   . CHF (congestive heart failure)   . Paroxysmal atrial fibrillation   . Bradycardia   . Hypertension   . Renal artery stenosis   . COPD (chronic obstructive pulmonary disease)   . Obesity hypoventilation syndrome   . Pruritus   . Tracheobronchitis   . Acute rhinosinusitis   . Anemia   . Hypoxemia   . Peripheral edema   . Obesity, endogenous   . Diabetes mellitus   . Acid reflux disease   . Osteoarthritis, generalized   . Hypothyroidism   . Lung nodule     lingula  . OSA (obstructive sleep apnea)     NPSG 04/03/00--AHI 28.hr  . DVT (deep venous thrombosis) 2003    left leg  . Vaginal atrophy   . Hx of colonic polyps   . Acute diastolic heart failure   . Secondary pulmonary hypertension 06/09/2013   Past Surgical  History  Procedure Laterality Date  . Lumbar spine surgery    . Cholecystectomy    . Hemorroidectomy    . Repair wound fistula    . Right shoulder    . Elbow surgery    . Dilation and curettage of uterus    . Breast surgery  1998    lumpectomy  . Oophorectomy  1959    BSO-? ovarian cancer   Family History  Problem Relation Age of Onset  . Pneumonia Mother   . Heart disease Mother   . Allergies Mother   . Emphysema Mother   . Arthritis Mother   . Heart attack Father   . Stroke Sister   . Diabetes Sister   . Heart disease Sister   . Diabetes Brother   . Diabetes      grandfather  . Clotting disorder Brother   . Arthritis Sister   . Breast cancer      aunt  . Bone cancer      aunt   History  Substance Use Topics  . Smoking status: Former Smoker    Quit date: 08/13/1978  . Smokeless tobacco: Never Used  . Alcohol Use: No   OB History   Grav Para Term Preterm Abortions TAB SAB Ect Mult Living   0              Obstetric Comments   2 adopted children  Review of Systems  Respiratory: Positive for shortness of breath.   Cardiovascular: Positive for chest pain.  All other systems reviewed and are negative.    Allergies  Cefuroxime axetil; Cephalexin; Clarithromycin; Doxycycline; Erythromycin; Penicillins; and Tetracycline  Home Medications   Current Outpatient Rx  Name  Route  Sig  Dispense  Refill  . acetaminophen (TYLENOL) 325 MG tablet   Oral   Take 2 tablets (650 mg total) by mouth every 6 (six) hours as needed for mild pain or headache.         . allopurinol (ZYLOPRIM) 100 MG tablet   Oral   Take 1 tablet (100 mg total) by mouth daily.         Marland Kitchen azelastine (ASTELIN) 137 MCG/SPRAY nasal spray   Nasal   Place 2 sprays into the nose daily. Use in each nostril as directed   90 mL   3   . budesonide (PULMICORT) 0.25 MG/2ML nebulizer solution   Nebulization   Take 2 mLs (0.25 mg total) by nebulization 2 (two) times daily.   60 mL   12    . clopidogrel (PLAVIX) 75 MG tablet   Oral   Take 1 tablet (75 mg total) by mouth daily.   90 tablet   3   . diltiazem (CARDIZEM CD) 360 MG 24 hr capsule   Oral   Take 1 capsule (360 mg total) by mouth daily.   90 capsule   3   . furosemide (LASIX) 40 MG tablet      1 tablet by mouth twice a day and an extra tablet prn for weight gain of 2 lbs   270 tablet   3   . insulin aspart (NOVOLOG) 100 UNIT/ML injection      Check CBG before meals. CBG 121-150 give 2 units  CBG 151-200 give 3 units CBG 201-250 give 5 units CBG 251-300 give 8 units CBG 301-350 give 11 units CBG 351-400 give 15 units If CBG > 400 CALL MD   1 vial   12   . insulin glargine (LANTUS) 100 UNIT/ML injection   Subcutaneous   Inject 0.2 mLs (20 Units total) into the skin daily.   10 mL   12   . isosorbide mononitrate (IMDUR) 30 MG 24 hr tablet      1 tablet by mouth daily   90 tablet   3   . levalbuterol (XOPENEX) 0.63 MG/3ML nebulizer solution   Nebulization   Take 3 mLs (0.63 mg total) by nebulization every 6 (six) hours as needed for wheezing or shortness of breath.   3 mL   12   . levothyroxine (SYNTHROID, LEVOTHROID) 25 MCG tablet   Oral   Take 25 mcg by mouth See admin instructions. Take 1 tablet (25 mcg) on Monday, Wednesday, Friday (take 50 mcg on Tues, Thurs, Sat, Sun)         . levothyroxine (SYNTHROID, LEVOTHROID) 50 MCG tablet   Oral   Take 50 mcg by mouth See admin instructions. Take 1 tablet (50 mcg) on Tuesday, Thursday, Saturday, Sunday (take 25 mcg on Monday, Wednesday and Friday)         . LORazepam (ATIVAN) 0.5 MG tablet   Oral   Take 0.25 mg by mouth 3 (three) times daily.         . metoprolol (LOPRESSOR) 50 MG tablet      1 tablet by mouth twice a day   180 tablet   3   .  ondansetron (ZOFRAN) 4 MG tablet   Oral   Take 4 mg by mouth every 8 (eight) hours as needed for nausea or vomiting.         Marland Kitchen oxybutynin (DITROPAN XL) 10 MG 24 hr tablet   Oral    Take 10 mg by mouth daily.          Marland Kitchen zolpidem (AMBIEN) 5 MG tablet      Take one tablet by mouth every night at bedtime as needed for sleep   30 tablet   5   . influenza vac split quadrivalent PF (FLUARIX) 0.5 ML injection   Intramuscular   Inject 0.5 mLs into the muscle once.         . nitroGLYCERIN (NITROSTAT) 0.4 MG SL tablet   Sublingual   Place 0.4 mg under the tongue every 5 (five) minutes as needed for chest pain.           BP 133/65  Pulse 89  Temp(Src) 97.7 F (36.5 C) (Oral)  Resp 20  SpO2 89% Physical Exam  Nursing note and vitals reviewed. Constitutional: She is oriented to person, place, and time. She appears well-developed and well-nourished.  Non-toxic appearance. No distress.  HENT:  Head: Normocephalic and atraumatic.  Eyes: Conjunctivae, EOM and lids are normal. Pupils are equal, round, and reactive to light.  Neck: Normal range of motion. Neck supple. No tracheal deviation present. No mass present.  Cardiovascular: Normal rate, regular rhythm and normal heart sounds.  Exam reveals no gallop.   No murmur heard. Pulmonary/Chest: Effort normal. No stridor. No respiratory distress. She has decreased breath sounds. She has no wheezes. She has no rhonchi. She has no rales.  Abdominal: Soft. Normal appearance and bowel sounds are normal. She exhibits no distension. There is no tenderness. There is no rebound and no CVA tenderness.  Musculoskeletal: Normal range of motion. She exhibits no edema and no tenderness.  Bilateral le edema  Neurological: She is alert and oriented to person, place, and time. She has normal strength. No cranial nerve deficit or sensory deficit. GCS eye subscore is 4. GCS verbal subscore is 5. GCS motor subscore is 6.  Skin: Skin is warm and dry. No abrasion and no rash noted.  Psychiatric: She has a normal mood and affect. Her speech is normal and behavior is normal.    ED Course  Procedures (including critical care time) Labs  Review Labs Reviewed  CBC - Abnormal; Notable for the following:    RBC 3.43 (*)    Hemoglobin 9.9 (*)    HCT 31.1 (*)    RDW 18.5 (*)    All other components within normal limits  BASIC METABOLIC PANEL - Abnormal; Notable for the following:    Glucose, Bld 213 (*)    GFR calc non Af Amer 47 (*)    GFR calc Af Amer 55 (*)    All other components within normal limits  PRO B NATRIURETIC PEPTIDE - Abnormal; Notable for the following:    Pro B Natriuretic peptide (BNP) 8058.0 (*)    All other components within normal limits  POCT I-STAT TROPONIN I   Imaging Review Dg Chest 2 View  09/09/2013   CLINICAL DATA:  Shortness of breath, chest pain  EXAM: CHEST  2 VIEW  COMPARISON:  [facility] chest radiographs dated 09/09/2013  FINDINGS: Increased interstitial markings, at least some of which is likely chronic. Superimposed pneumonia is difficult to exclude, particularly in the left lower lobe/lingula.  Interstitial edema is not excluded but is considered unlikely. No pleural effusion or pneumothorax.  Cardiomegaly.  Coronary stent.  Degenerative changes of the visualized thoracolumbar spine. Stable lower thoracic compression fracture deformity.  Cholecystectomy clips.  Right shoulder arthroplasty.  IMPRESSION: Increased interstitial markings, at least some of which is likely chronic.  Superimposed pneumonia in the left lower lobe/lingula is not excluded.  Mild interstitial edema is considered unlikely.   Electronically Signed   By: Julian Hy M.D.   On: 09/09/2013 18:27    EKG Interpretation    Date/Time:  Wednesday September 09 2013 17:16:07 EST Ventricular Rate:  77 PR Interval:    QRS Duration: 82 QT Interval:  402 QTC Calculation: 454 R Axis:   81 Text Interpretation:  Atrial fibrillation Abnormal ECG Confirmed by Aditya Nastasi  MD, Bobbye Petti ([id number]) on 09/09/2013 7:09:28 PM            MDM  No diagnosis found. Patient given Lasix 80 mg IV push for treatment of her CHF. Will  be admitted to the hospitalist service    Leota Jacobsen, MD 09/09/13 1911

## 2013-09-10 DIAGNOSIS — J449 Chronic obstructive pulmonary disease, unspecified: Secondary | ICD-10-CM

## 2013-09-10 DIAGNOSIS — I5033 Acute on chronic diastolic (congestive) heart failure: Principal | ICD-10-CM

## 2013-09-10 DIAGNOSIS — E1129 Type 2 diabetes mellitus with other diabetic kidney complication: Secondary | ICD-10-CM

## 2013-09-10 DIAGNOSIS — I10 Essential (primary) hypertension: Secondary | ICD-10-CM

## 2013-09-10 DIAGNOSIS — I4891 Unspecified atrial fibrillation: Secondary | ICD-10-CM

## 2013-09-10 DIAGNOSIS — E039 Hypothyroidism, unspecified: Secondary | ICD-10-CM

## 2013-09-10 DIAGNOSIS — J4489 Other specified chronic obstructive pulmonary disease: Secondary | ICD-10-CM

## 2013-09-10 DIAGNOSIS — G4733 Obstructive sleep apnea (adult) (pediatric): Secondary | ICD-10-CM

## 2013-09-10 LAB — COMPREHENSIVE METABOLIC PANEL
ALBUMIN: 3.1 g/dL — AB (ref 3.5–5.2)
ALT: 21 U/L (ref 0–35)
AST: 16 U/L (ref 0–37)
Alkaline Phosphatase: 95 U/L (ref 39–117)
BILIRUBIN TOTAL: 0.3 mg/dL (ref 0.3–1.2)
BUN: 21 mg/dL (ref 6–23)
CALCIUM: 8.9 mg/dL (ref 8.4–10.5)
CHLORIDE: 101 meq/L (ref 96–112)
CO2: 25 meq/L (ref 19–32)
CREATININE: 1.17 mg/dL — AB (ref 0.50–1.10)
GFR calc Af Amer: 51 mL/min — ABNORMAL LOW (ref >90)
GFR, EST NON AFRICAN AMERICAN: 44 mL/min — AB (ref >90)
Glucose, Bld: 188 mg/dL — ABNORMAL HIGH (ref 70–99)
Potassium: 3.9 mEq/L (ref 3.7–5.3)
SODIUM: 140 meq/L (ref 137–147)
Total Protein: 6.9 g/dL (ref 6.0–8.3)

## 2013-09-10 LAB — TROPONIN I

## 2013-09-10 LAB — INFLUENZA PANEL BY PCR (TYPE A & B)
H1N1 flu by pcr: NOT DETECTED
INFLBPCR: NEGATIVE
Influenza A By PCR: NEGATIVE

## 2013-09-10 LAB — GLUCOSE, CAPILLARY
GLUCOSE-CAPILLARY: 155 mg/dL — AB (ref 70–99)
Glucose-Capillary: 103 mg/dL — ABNORMAL HIGH (ref 70–99)
Glucose-Capillary: 156 mg/dL — ABNORMAL HIGH (ref 70–99)
Glucose-Capillary: 190 mg/dL — ABNORMAL HIGH (ref 70–99)
Glucose-Capillary: 217 mg/dL — ABNORMAL HIGH (ref 70–99)

## 2013-09-10 LAB — CBC
HCT: 29.9 % — ABNORMAL LOW (ref 36.0–46.0)
HEMOGLOBIN: 9.5 g/dL — AB (ref 12.0–15.0)
MCH: 29.1 pg (ref 26.0–34.0)
MCHC: 31.8 g/dL (ref 30.0–36.0)
MCV: 91.4 fL (ref 78.0–100.0)
Platelets: 274 10*3/uL (ref 150–400)
RBC: 3.27 MIL/uL — ABNORMAL LOW (ref 3.87–5.11)
RDW: 18.7 % — ABNORMAL HIGH (ref 11.5–15.5)
WBC: 6.4 10*3/uL (ref 4.0–10.5)

## 2013-09-10 LAB — LEGIONELLA ANTIGEN, URINE: Legionella Antigen, Urine: NEGATIVE

## 2013-09-10 LAB — SEDIMENTATION RATE: SED RATE: 55 mm/h — AB (ref 0–22)

## 2013-09-10 LAB — C-REACTIVE PROTEIN: CRP: 1.5 mg/dL — ABNORMAL HIGH (ref <0.60)

## 2013-09-10 LAB — PROTIME-INR
INR: 1.1 (ref 0.00–1.49)
Prothrombin Time: 14 seconds (ref 11.6–15.2)

## 2013-09-10 LAB — STREP PNEUMONIAE URINARY ANTIGEN: Strep Pneumo Urinary Antigen: NEGATIVE

## 2013-09-10 MED ORDER — TRAMADOL HCL 50 MG PO TABS
50.0000 mg | ORAL_TABLET | Freq: Four times a day (QID) | ORAL | Status: DC | PRN
  Administered 2013-09-10 – 2013-09-12 (×4): 50 mg via ORAL
  Filled 2013-09-10 (×4): qty 1

## 2013-09-10 MED ORDER — ADULT MULTIVITAMIN W/MINERALS CH
1.0000 | ORAL_TABLET | Freq: Every day | ORAL | Status: DC
  Administered 2013-09-10 – 2013-09-12 (×3): 1 via ORAL
  Filled 2013-09-10 (×3): qty 1

## 2013-09-10 MED ORDER — PRO-STAT SUGAR FREE PO LIQD
30.0000 mL | ORAL | Status: DC
  Administered 2013-09-10 – 2013-09-11 (×2): 30 mL via ORAL
  Filled 2013-09-10 (×3): qty 30

## 2013-09-10 MED ORDER — IPRATROPIUM-ALBUTEROL 0.5-2.5 (3) MG/3ML IN SOLN
3.0000 mL | RESPIRATORY_TRACT | Status: DC
  Administered 2013-09-10: 3 mL via RESPIRATORY_TRACT
  Filled 2013-09-10: qty 3

## 2013-09-10 MED ORDER — IPRATROPIUM-ALBUTEROL 0.5-2.5 (3) MG/3ML IN SOLN
3.0000 mL | Freq: Four times a day (QID) | RESPIRATORY_TRACT | Status: DC
  Administered 2013-09-10 (×2): 3 mL via RESPIRATORY_TRACT
  Filled 2013-09-10 (×2): qty 3

## 2013-09-10 MED ORDER — IPRATROPIUM-ALBUTEROL 0.5-2.5 (3) MG/3ML IN SOLN
3.0000 mL | Freq: Two times a day (BID) | RESPIRATORY_TRACT | Status: DC
  Administered 2013-09-11 – 2013-09-12 (×3): 3 mL via RESPIRATORY_TRACT
  Filled 2013-09-10: qty 3

## 2013-09-10 NOTE — Progress Notes (Signed)
[facility] PROGRESS NOTE  Filed Weights   09/09/13 2118 09/10/13 5809  Weight: 76.431 kg (168 lb 8 oz) 75.569 kg (166 lb 9.6 oz)        Intake/Output Summary (Last 24 hours) at 09/10/13 1632 Last data filed at 09/10/13 1459  Gross per 24 hour  Intake    840 ml  Output   1651 ml  Net   -811 ml    Assessment/Plan: 1-Acute on chronic resp failure due to acute exacerbation of chronic diastolic congestive heart failure: Due to diet indiscretion (patient admitted to be eating plenty of can soups lately since appetite was poor). -continue IV lasix, daily weight, strict I's and O's -sodium restriction -will repeat CXR in am  2-Pneumonitis/PNA: low on differential, no fever and normal WBC's. Will hold abx's for now. -follow CXR  3-OBSTRUCTIVE SLEEP APNEA: patient use oxygen 24/7, unable to tolerate CPAP according to her  4-HYPOTHYROIDISM: continue synthroid  5-HYPERTENSION: stable. Will follow current medication regimen  6-CAD: troponin negative. No CP -continue b-blockers and plavix  7-Atrial fibrillation: rate controlled. Continue b-blockers, cardizem and plavix.  8-DM: continue SSI and lantus  9-chronic resp failure due to COPD: stable.  -continue home supplementation, pulmicort and PRN xopenex      Code Status: Full Family Communication: husband and sister in law at bedside Disposition Plan: to be determine   Consultants:  None   Procedures: ECHO: (from 05/2013) - Left ventricle: Wall thickness was increased in a pattern of mild LVH. Systolic function was vigorous. The estimated ejection fraction was in the range of 65% to 70%. Features are consistent with a pseudonormal left ventricular filling pattern, with concomitant abnormal relaxation and increased filling pressure (grade 2 diastolic dysfunction). Doppler parameters are consistent with high ventricular filling pressure. - Aortic valve: Mild regurgitation. - Mitral valve: Severely calcified  annulus. Mild to moderate regurgitation. - Left atrium: The atrium was mildly dilated. - Tricuspid valve: Moderate regurgitation. - Pulmonary arteries: Systolic pressure was moderately increased. PA peak pressure: 17mm Hg (S).  Antibiotics:  None   HPI/Subjective: Breathing better. No fever, denies orthopnea  Objective: Filed Vitals:   09/10/13 0613 09/10/13 0811 09/10/13 0933 09/10/13 1334  BP: 129/69  138/87 136/74  Pulse: 101 102 113 110  Temp: 97.7 F (36.5 C)   97.9 F (36.6 C)  TempSrc: Oral   Oral  Resp: 16 16  16   Height:      Weight: 75.569 kg (166 lb 9.6 oz)     SpO2: 98% 97%  98%     Exam:  General: Alert, awake, oriented x3, in no acute distress. Afebrile and breathing better.  HEENT: No bruits, no goiter. Positive JVD Heart: Regular rate and rhythm, without murmurs, rubs, gallops. Positive 1+ edema Lungs: decreased air movement at bases  Abdomen: Soft, nontender, nondistended, positive bowel sounds.  Neuro: Grossly intact, nonfocal.   Data Reviewed: Basic Metabolic Panel:  Recent Labs Lab 09/09/13 1740 09/10/13 0505  NA 137 140  K 4.5 3.9  CL 96 101  CO2 27 25  GLUCOSE 213* 188*  BUN 21 21  CREATININE 1.10 1.17*  CALCIUM 9.3 8.9   Liver Function Tests:  Recent Labs Lab 09/10/13 0505  AST 16  ALT 21  ALKPHOS 95  BILITOT 0.3  PROT 6.9  ALBUMIN 3.1*   CBC:  Recent Labs Lab 09/09/13 1740 09/10/13 0505  WBC 6.1 6.4  HGB 9.9* 9.5*  HCT 31.1* 29.9*  MCV 90.7 91.4  PLT 285 274  Cardiac Enzymes:  Recent Labs Lab 09/09/13 2304 09/10/13 0505 09/10/13 1039  TROPONINI <0.30 <0.30 <0.30   BNP (last 3 results)  Recent Labs  06/13/13 0406 07/15/13 2101 09/09/13 1740  PROBNP 1503.0* 31366.0* 8058.0*   CBG:  Recent Labs Lab 09/10/13 0015 09/10/13 0650 09/10/13 1213 09/10/13 1616  GLUCAP 190* 155* 156* 217*    Studies: Dg Chest 2 View  09/09/2013   CLINICAL DATA:  Shortness of breath, chest pain  EXAM: CHEST  2  VIEW  COMPARISON:  [facility] chest radiographs dated 09/09/2013  FINDINGS: Increased interstitial markings, at least some of which is likely chronic. Superimposed pneumonia is difficult to exclude, particularly in the left lower lobe/lingula. Interstitial edema is not excluded but is considered unlikely. No pleural effusion or pneumothorax.  Cardiomegaly.  Coronary stent.  Degenerative changes of the visualized thoracolumbar spine. Stable lower thoracic compression fracture deformity.  Cholecystectomy clips.  Right shoulder arthroplasty.  IMPRESSION: Increased interstitial markings, at least some of which is likely chronic.  Superimposed pneumonia in the left lower lobe/lingula is not excluded.  Mild interstitial edema is considered unlikely.   Electronically Signed   By: Julian Hy M.D.   On: 09/09/2013 18:27    Scheduled Meds: . allopurinol  100 mg Oral Daily  . azelastine  2 spray Each Nare Daily  . budesonide  0.25 mg Nebulization BID  . clopidogrel  75 mg Oral Daily  . colchicine  0.6 mg Oral Daily  . diltiazem  360 mg Oral Daily  . enoxaparin (LOVENOX) injection  40 mg Subcutaneous Q24H  . feeding supplement (PRO-STAT SUGAR FREE 64)  30 mL Oral Q24H  . furosemide  40 mg Oral BID  . insulin aspart  0-15 Units Subcutaneous TID WC  . insulin aspart  0-5 Units Subcutaneous QHS  . insulin glargine  20 Units Subcutaneous Daily  . ipratropium-albuterol  3 mL Nebulization Q6H  . isosorbide mononitrate  30 mg Oral Daily  . levothyroxine  25 mcg Oral QAC breakfast  . metoprolol  50 mg Oral BID  . multivitamin with minerals  1 tablet Oral Daily  . oxybutynin  10 mg Oral Daily   Continuous Infusions:   Time > 30 minutes  [name]  [facility] Pager [phone]. If 8PM-8AM, please contact night-coverage at [email or web address], password [email or web address] 09/10/2013, 4:32 PM  LOS: 1 day

## 2013-09-10 NOTE — Progress Notes (Signed)
INITIAL NUTRITION ASSESSMENT  DOCUMENTATION CODES Per approved criteria  -Obesity Unspecified   INTERVENTION: Provide Ensure Complete BID PRN Provide Multivitamin with minerals daily Provide Pro-stat once daily Encourage adequate PO intake  NUTRITION DIAGNOSIS: Unintentional weight loss related to fluid loss as evidenced by >4% weight loss in less than 2 months.   Goal: Pt to meet >/= 90% of their estimated nutrition needs   Monitor:  PO intake Weight Labs  Reason for Assessment: Malnutrition Screening Tool, score of 5  80 y.o. female  Admitting Dx: Acute on chronic diastolic congestive heart failure  ASSESSMENT: 80 y.o. female with Past medical history of coronary artery disease, CHF, diastolic dysfunction, atrial fibrillation, hypertension, COPD, sleep apnea. The patient presented with complaints of cough with shortness of breath. She also right-sided chest pain.  Pt reports that her appetite has been better and she has been eating fairly well. Pt reports eating 2-3 meals daily PTA. She relates weight loss to Lasix and fluid losses. Weight history shows 4.5% weight loss in the past 7 weeks. Pt denies recent weight loss. Per nursing notes, pt ate 100% of breakfast this morning; pt reports eating 50% of breakfast this morning. Pt getting blood drawn at time of visit. Unable to perform physical exam at this time.  Height: Ht Readings from Last 1 Encounters:  09/09/13 5' 1.25" (1.556 m)    Weight: Wt Readings from Last 1 Encounters:  09/10/13 166 lb 9.6 oz (75.569 kg)    Ideal Body Weight: 105 lbs  % Ideal Body Weight: 158%  Wt Readings from Last 10 Encounters:  09/10/13 166 lb 9.6 oz (75.569 kg)  07/31/13 174 lb (78.926 kg)  07/21/13 190 lb 11.2 oz (86.501 kg)  07/15/13 194 lb 10.7 oz (88.3 kg)  06/16/13 178 lb 9.2 oz (81 kg)  06/04/13 189 lb (85.73 kg)  04/23/13 192 lb 12.8 oz (87.454 kg)  02/27/13 193 lb (87.544 kg)  11/27/12 188 lb (85.276 kg)  09/08/12 193  lb 6.4 oz (87.726 kg)    Usual Body Weight: 189 to 195 lbs  % Usual Body Weight: 88%  BMI:  Body mass index is 31.21 kg/(m^2). (Obesity)  Estimated Nutritional Needs: Kcal: 1500-1700 Protein: 90-100 grams Fluid: 1.5-1.7 L/day  Skin: WDL  Diet Order: Carb Control  EDUCATION NEEDS: -No education needs identified at this time   Intake/Output Summary (Last 24 hours) at 09/10/13 1045 Last data filed at 09/10/13 0900  Gross per 24 hour  Intake    600 ml  Output    800 ml  Net   -200 ml    Last BM: 1/28   Labs:   Recent Labs Lab 09/09/13 1740 09/10/13 0505  NA 137 140  K 4.5 3.9  CL 96 101  CO2 27 25  BUN 21 21  CREATININE 1.10 1.17*  CALCIUM 9.3 8.9  GLUCOSE 213* 188*    CBG (last 3)   Recent Labs  09/10/13 0015 09/10/13 0650  GLUCAP 190* 155*    Scheduled Meds: . allopurinol  100 mg Oral Daily  . azelastine  2 spray Each Nare Daily  . budesonide  0.25 mg Nebulization BID  . clopidogrel  75 mg Oral Daily  . colchicine  0.6 mg Oral Daily  . diltiazem  360 mg Oral Daily  . enoxaparin (LOVENOX) injection  40 mg Subcutaneous Q24H  . furosemide  40 mg Oral BID  . insulin aspart  0-15 Units Subcutaneous TID WC  . insulin aspart  0-5 Units Subcutaneous  QHS  . insulin glargine  20 Units Subcutaneous Daily  . ipratropium-albuterol  3 mL Nebulization Q6H  . isosorbide mononitrate  30 mg Oral Daily  . levothyroxine  25 mcg Oral QAC breakfast  . metoprolol  50 mg Oral BID  . oxybutynin  10 mg Oral Daily    Continuous Infusions:   Past Medical History  Diagnosis Date  . CAD (coronary artery disease)   . CHF (congestive heart failure)   . Paroxysmal atrial fibrillation   . Bradycardia   . Hypertension   . Renal artery stenosis   . COPD (chronic obstructive pulmonary disease)   . Obesity hypoventilation syndrome   . Pruritus   . Tracheobronchitis   . Acute rhinosinusitis   . Anemia   . Hypoxemia   . Peripheral edema   . Obesity, endogenous    . Diabetes mellitus   . Acid reflux disease   . Osteoarthritis, generalized   . Hypothyroidism   . Lung nodule     lingula  . OSA (obstructive sleep apnea)     NPSG 04/03/00--AHI 28.hr  . DVT (deep venous thrombosis) 2003    left leg  . Vaginal atrophy   . Hx of colonic polyps   . Acute diastolic heart failure   . Secondary pulmonary hypertension 06/09/2013    Past Surgical History  Procedure Laterality Date  . Lumbar spine surgery    . Cholecystectomy    . Hemorroidectomy    . Repair wound fistula    . Right shoulder    . Elbow surgery    . Dilation and curettage of uterus    . Breast surgery  1998    lumpectomy  . Oophorectomy  1959    BSO-? ovarian cancer    Pryor Ochoa RD, LDN Inpatient Clinical Dietitian Pager: [phone] After Hours Pager: [phone]

## 2013-09-10 NOTE — Care Management Note (Signed)
    Page 1 of 2   09/10/2013     10:59:41 AM   CARE MANAGEMENT NOTE 09/10/2013  Patient:  TALYAH, SEDER   Account Number:  [id number]  Date Initiated:  09/10/2013  Documentation initiated by:  [facility]  Subjective/Objective Assessment:   80 y.o. female with PMHx of CAD, CHF, diastolic dysfunction, atrial fibrillation, HTN, COPD, sleep apnea. Presented with complaints of cough; SOB; cp./Home with spouse     Action/Plan:   IV diureses, PT consult//Home with HH vs SNF   Anticipated DC Date:  09/13/2013   Anticipated DC Plan:  [place] referral  Clinical Social Worker      DC Planning Services  CM consult      [facility] Choice  Resumption Of Svcs/PTA Provider   Choice offered to / List presented to:          [facility] arranged  HH-1 RN  [place]      [facility] agency  [place]   Status of service:  In process, will continue to follow Medicare Important Message given?   (If response is "NO", the following Medicare IM given date fields will be blank) Date Medicare IM given:   Date Additional Medicare IM given:    Discharge Disposition:    Per UR Regulation:    If discussed at Long Length of Stay Meetings, dates discussed:    Comments:  09/10/13 [place], RN, BSN, NCM [phone] Pt currently active with [facility] for RN/PT services.  Resumption of care requested if correaltes with PT reccomendations.  Christa See, RN of [facility] notified.  No DME needs identified at this time.

## 2013-09-10 NOTE — Progress Notes (Signed)
Pt a/o, no c/o pain, pt oob to BSC, pt received 60 mg IV lasix and having adequate amt of output, pt negative for flu, vss, pt stable

## 2013-09-10 NOTE — Plan of Care (Signed)
Problem: Phase I Progression Outcomes Goal: EF % per last Echo/documented,Core Reminder form on chart Outcome: Completed/Met Date Met:  09/10/13 EF as of 06/08/13 65-70&     

## 2013-09-10 NOTE — H&P (Signed)
[facility] History and Physical  Patient: [name]  MCN:[id number]  DOB: 03-05-1934  DOS: the patient was seen and examined on 09/09/2013 PCP: MCNEILL,WENDY, MD  Chief Complaint: Shortness of breath  HPI: [name] is a 80 y.o. female with Past medical history of coronary artery disease, CHF, diastolic dysfunction, atrial fibrillation, hypertension, COPD, sleep apnea. The patient is coming from home. The patient presented with complaints of cough with shortness of breath. She also right-sided chest pain. She mentions that she was at her baseline until Saturday. Since Sunday she had an episode of cough with whitish expectoration. Monday she had complaints of right-sided pain which felt like a dull ache without any pleuritic component. Throughout this episode she had complaints of shortness of breath on her exertion. And today her breathing was significantly worse and therefore she came to her PCPs office where after performing a chest x-ray she was sent here for further workup. She was admitted in December for healthcare associated pneumonia, she was found to have mild dysphagia and was recommended on mechanical soft diet. She was discharged on Levaquin and steroids. She had another course of Levaquin in late December and early January. Recently she had thirty-day cardiac monitoring which showed atrial fibrillation and she was placed on Plavix, Cardizem and her Lopressor dose was reduced from 100-50 mg twice a day. She mentions she has been taking Lasix twice a day. She has gained 3 pounds over last 3 days along with bilateral leg swelling. She denies any fever or chills. She also denies any aspiration. She complains of orthopnea but denies any PND. She also denies leg tenderness  Review of Systems: as mentioned in the history of present illness.  A Comprehensive review of the other systems is negative.  Past Medical History  Diagnosis Date  . CAD (coronary  artery disease)   . CHF (congestive heart failure)   . Paroxysmal atrial fibrillation   . Bradycardia   . Hypertension   . Renal artery stenosis   . COPD (chronic obstructive pulmonary disease)   . Obesity hypoventilation syndrome   . Pruritus   . Tracheobronchitis   . Acute rhinosinusitis   . Anemia   . Hypoxemia   . Peripheral edema   . Obesity, endogenous   . Diabetes mellitus   . Acid reflux disease   . Osteoarthritis, generalized   . Hypothyroidism   . Lung nodule     lingula  . OSA (obstructive sleep apnea)     NPSG 04/03/00--AHI 28.hr  . DVT (deep venous thrombosis) 2003    left leg  . Vaginal atrophy   . Hx of colonic polyps   . Acute diastolic heart failure   . Secondary pulmonary hypertension 06/09/2013   Past Surgical History  Procedure Laterality Date  . Lumbar spine surgery    . Cholecystectomy    . Hemorroidectomy    . Repair wound fistula    . Right shoulder    . Elbow surgery    . Dilation and curettage of uterus    . Breast surgery  1998    lumpectomy  . Oophorectomy  1959    BSO-? ovarian cancer   Social History:  reports that she quit smoking about 35 years ago. She has never used smokeless tobacco. She reports that she does not drink alcohol or use illicit drugs. Independent for most of her  ADL.  Allergies  Allergen Reactions  . Cefuroxime Axetil Shortness Of Breath, Swelling and Rash  Throat swelling  . Cephalexin Shortness Of Breath, Swelling and Rash    Throat swelling  . Clarithromycin Shortness Of Breath, Swelling and Rash    Throat swelling  . Doxycycline Shortness Of Breath, Swelling and Rash     rash on legs and feet, throat swelling  . Erythromycin Shortness Of Breath, Swelling and Rash    Throat swelling  . Penicillins Shortness Of Breath, Swelling and Rash    Throat swelling  . Tetracycline Shortness Of Breath, Swelling and Rash    Throat swelling    Family History  Problem Relation Age of Onset  . Pneumonia Mother    . Heart disease Mother   . Allergies Mother   . Emphysema Mother   . Arthritis Mother   . Heart attack Father   . Stroke Sister   . Diabetes Sister   . Heart disease Sister   . Diabetes Brother   . Diabetes      grandfather  . Clotting disorder Brother   . Arthritis Sister   . Breast cancer      aunt  . Bone cancer      aunt    Prior to Admission medications   Medication Sig Start Date End Date Taking? Authorizing Provider  acetaminophen (TYLENOL) 325 MG tablet Take 2 tablets (650 mg total) by mouth every 6 (six) hours as needed for mild pain or headache. 07/21/13  Yes Samella Parr, NP  allopurinol (ZYLOPRIM) 100 MG tablet Take 1 tablet (100 mg total) by mouth daily. 07/21/13  Yes Samella Parr, NP  azelastine (ASTELIN) 137 MCG/SPRAY nasal spray Place 2 sprays into the nose daily. Use in each nostril as directed 04/23/13 08/04/14 Yes Clinton D Young, MD  budesonide (PULMICORT) 0.25 MG/2ML nebulizer solution Take 2 mLs (0.25 mg total) by nebulization 2 (two) times daily. 07/15/13  Yes Barton Dubois, MD  clopidogrel (PLAVIX) 75 MG tablet Take 1 tablet (75 mg total) by mouth daily. 08/17/13  Yes Casandra Doffing, MD  colchicine 0.6 MG tablet Take 0.6 mg by mouth daily. Takes as needed for gout attacks.   Yes Historical Provider, MD  diltiazem (CARDIZEM CD) 360 MG 24 hr capsule Take 1 capsule (360 mg total) by mouth daily. 08/17/13  Yes Casandra Doffing, MD  furosemide (LASIX) 40 MG tablet 1 tablet by mouth twice a day and an extra tablet prn for weight gain of 2 lbs 08/17/13  Yes Casandra Doffing, MD  insulin aspart (NOVOLOG) 100 UNIT/ML injection Check CBG before meals. CBG 121-150 give 2 units  CBG 151-200 give 3 units CBG 201-250 give 5 units CBG 251-300 give 8 units CBG 301-350 give 11 units CBG 351-400 give 15 units If CBG > 400 CALL MD 07/21/13  Yes Samella Parr, NP  insulin glargine (LANTUS) 100 UNIT/ML injection Inject 0.2 mLs (20 Units total) into the skin daily. 07/21/13  Yes Samella Parr, NP  isosorbide mononitrate (IMDUR) 30 MG 24 hr tablet 1 tablet by mouth daily 08/17/13  Yes Casandra Doffing, MD  levalbuterol [facility]) 0.63 MG/3ML nebulizer solution Take 3 mLs (0.63 mg total) by nebulization every 6 (six) hours as needed for wheezing or shortness of breath. 07/15/13  Yes Barton Dubois, MD  levothyroxine (SYNTHROID, LEVOTHROID) 25 MCG tablet Take 25 mcg by mouth See admin instructions. Take 1 tablet (25 mcg) on Monday, Wednesday, Friday (take 50 mcg on Tues, Thurs, Sat, Sun)   Yes Historical Provider, MD  levothyroxine (SYNTHROID, LEVOTHROID) 50 MCG tablet Take 50 mcg  by mouth See admin instructions. Take 1 tablet (50 mcg) on Tuesday, Thursday, Saturday, Sunday (take 25 mcg on Monday, Wednesday and Friday)   Yes Historical Provider, MD  LORazepam (ATIVAN) 0.5 MG tablet Take 0.25 mg by mouth 3 (three) times daily.   Yes Historical Provider, MD  metoprolol (LOPRESSOR) 50 MG tablet 1 tablet by mouth twice a day 08/17/13  Yes Casandra Doffing, MD  ondansetron (ZOFRAN) 4 MG tablet Take 4 mg by mouth every 8 (eight) hours as needed for nausea or vomiting.   Yes Historical Provider, MD  oxybutynin (DITROPAN XL) 10 MG 24 hr tablet Take 10 mg by mouth daily.    Yes Historical Provider, MD  zolpidem (AMBIEN) 5 MG tablet Take one tablet by mouth every night at bedtime as needed for sleep 07/28/13  Yes Blanchie Serve, MD  influenza vac split quadrivalent PF (FLUARIX) 0.5 ML injection Inject 0.5 mLs into the muscle once.    Historical Provider, MD  nitroGLYCERIN (NITROSTAT) 0.4 MG SL tablet Place 0.4 mg under the tongue every 5 (five) minutes as needed for chest pain.     Historical Provider, MD    Physical Exam: Filed Vitals:   09/09/13 2000 09/09/13 2033 09/09/13 2118 09/10/13 0033  BP: 129/77 149/81 152/80 122/65  Pulse: 100 60 106 101  Temp:   97.4 F (36.3 C) 97.9 F (36.6 C)  TempSrc:   Oral Oral  Resp: 16 23 20 16   Height:   5' 1.25" (1.556 m)   Weight:   76.431 kg (168 lb 8 oz)    SpO2: 98% 96% 96% 96%    General: Alert, Awake and Oriented to Time, Place and Person. Appear in moderate distress Eyes: PERRL ENT: Oral Mucosa clear moist. Neck: Mild JVD Cardiovascular: S1 and S2 Present,  no Murmur, Peripheral Pulses Present Respiratory: Bilateral Air entry equal and Decreased, bilateral basal  Crackles,no  wheezes Abdomen: Bowel Sound Present, Soft and Non tender Skin: No  Rash Extremities: Bilateral +2  Pedal edema, no  calf tenderness Neurologic: Grossly Unremarkable. Labs on Admission:  CBC:  Recent Labs Lab 09/09/13 1740  WBC 6.1  HGB 9.9*  HCT 31.1*  MCV 90.7  PLT 285    CMP     Component Value Date/Time   NA 137 09/09/2013 1740   K 4.5 09/09/2013 1740   CL 96 09/09/2013 1740   CO2 27 09/09/2013 1740   GLUCOSE 213* 09/09/2013 1740   BUN 21 09/09/2013 1740   CREATININE 1.10 09/09/2013 1740   CALCIUM 9.3 09/09/2013 1740   PROT 5.8* 07/16/2013 0335   ALBUMIN 2.2* 07/16/2013 0335   AST 170* 07/16/2013 0335   ALT 116* 07/16/2013 0335   ALKPHOS 113 07/16/2013 0335   BILITOT 0.3 07/16/2013 0335   GFRNONAA 47* 09/09/2013 1740   GFRAA 55* 09/09/2013 1740    No results found for this basename: LIPASE, AMYLASE,  in the last 168 hours No results found for this basename: AMMONIA,  in the last 168 hours   Recent Labs Lab 09/09/13 2304  TROPONINI <0.30   BNP (last 3 results)  Recent Labs  06/13/13 0406 07/15/13 2101 09/09/13 1740  PROBNP 1503.0* 31366.0* 8058.0*    Radiological Exams on Admission: Dg Chest 2 View  09/09/2013   CLINICAL DATA:  Shortness of breath, chest pain  EXAM: CHEST  2 VIEW  COMPARISON:  [facility] chest radiographs dated 09/09/2013  FINDINGS: Increased interstitial markings, at least some of which is likely chronic. Superimposed pneumonia is  difficult to exclude, particularly in the left lower lobe/lingula. Interstitial edema is not excluded but is considered unlikely. No pleural effusion or pneumothorax.  Cardiomegaly.   Coronary stent.  Degenerative changes of the visualized thoracolumbar spine. Stable lower thoracic compression fracture deformity.  Cholecystectomy clips.  Right shoulder arthroplasty.  IMPRESSION: Increased interstitial markings, at least some of which is likely chronic.  Superimposed pneumonia in the left lower lobe/lingula is not excluded.  Mild interstitial edema is considered unlikely.   Electronically Signed   By: Julian Hy M.D.   On: 09/09/2013 18:27    EKG: Independently reviewed. atrial fibrillation, rate controlled.  Assessment/Plan Principal Problem:   Acute on chronic diastolic congestive heart failure Active Problems:   HYPOTHYROIDISM   DM   OBSTRUCTIVE SLEEP APNEA   HYPERTENSION   CAD   Atrial fibrillation   Pneumonitis   1. Acute on chronic diastolic congestive heart failure the patient is presenting with complaints of shortness of breath, she has mild JVD, bilateral crackles, interstitial prominence, elevated proBNP, bilateral leg swellings. She had a recent echocardiogram in October which showed EF of 60%. She has chronic atrial fibrillation and due to that might have diastolic dysfunction. She has received one dose of IV Lasix in the ED and I would continue her on IV Lasix tomorrow. I would also continue her Imdur,  I will check her TSH and serial troponins  2. recurrent pulmonary infiltrates She has a history of multiple episodes with possible pneumonia with recurrent pulmonary infiltrates. She does have aspiration risk due to her mild dysphagia. This time she does not have any leukocytosis or fever but she did have cough 2 days ago. A suspicion for recurrent pneumonia is less likely at present. She may have aspiration pneumonitis versus chronic ILD. She has a followup appointment with her pulmonologist next month. At present I would check inflammatory markers. I would also check sputum culture, urine antigens, influenza antigen for further workup. I  would continue her on Symbicort, duo nebs, for a COPD  3. Gout Patient had a recent episode of gout and she has been taking allopurinol and colchicine since last 8 days I would continue it  4.Atrial fibrillation   patient appears to have a controlled atrial fibrillation at present She's not on any anticoagulation and was recently placed on Plavix for the same At present I will continue her rate controlling medications. With her diabetes heart failure she may benefit from anticoagulation but doest history of GI bleed which puts her at risk for bleeding. Continue Plavix.  5. diabetes mellitus Continue Lantus Placing the patient on sliding scale  6. Anemia Stable Monitor daily CBC  DVT Prophylaxis: subcutaneous Heparin Nutrition: Dysphagia diet   Code Status: Full   Family Communication: Family  was present at bedside, opportunity was given to ask question and all questions were answered satisfactorily at the time of interview. Disposition: Admitted to inpatient in telemetry unit.  Author: Berle Mull, MD [facility] Pager: [phone]  If 7PM-7AM, please contact night-coverage [email or web address] Password [email or web address]

## 2013-09-11 DIAGNOSIS — R0902 Hypoxemia: Secondary | ICD-10-CM

## 2013-09-11 DIAGNOSIS — J9620 Acute and chronic respiratory failure, unspecified whether with hypoxia or hypercapnia: Secondary | ICD-10-CM

## 2013-09-11 LAB — GLUCOSE, CAPILLARY
GLUCOSE-CAPILLARY: 145 mg/dL — AB (ref 70–99)
Glucose-Capillary: 143 mg/dL — ABNORMAL HIGH (ref 70–99)
Glucose-Capillary: 170 mg/dL — ABNORMAL HIGH (ref 70–99)

## 2013-09-11 LAB — ANA: ANA: NEGATIVE

## 2013-09-11 NOTE — Progress Notes (Signed)
Medicare Important Message given. Loraine Bhullar J. Breyah Akhter, RN, BSN, NCM [phone].   

## 2013-09-11 NOTE — Progress Notes (Signed)
I visited [name] and we discussed her Heart Failure.  She claims to have a good knowledge of her heart failure diagnosis and was doing well at home until last week.  In IPR meeting this am [name] explained that he discussed with [name] that patient had been eating a lot of soup at home secondary to poor appetite.  I reinforced the importance of low sodium diet and fluid restriction.  [name] is able to articulate that she weighs daily and has dialogue with her physician regularly.  She says that if her weight is up he has instructed her to take extra Lasix at home.  She know the signs and symptoms of heart failure, when to call the physician, and to take medications as prescribed.  She has plans to be discharged tomorrow to home with children.  Terri Binning RN, BSN, PCCN--Heart Failure Navigator

## 2013-09-11 NOTE — Evaluation (Signed)
Physical Therapy Evaluation Patient Details Name: [name] MRN: [id number] DOB: 1934-04-06 Today's Date: 09/11/2013 Time: 1975-8832 PT Time Calculation (min): 16 min  PT Assessment / Plan / Recommendation History of Present Illness  [name] is a 80 y.o. female with Past medical history of coronary artery disease, CHF, diastolic dysfunction, atrial fibrillation, hypertension, COPD, sleep apnea.  Admitted with shortness of breath.  Clinical Impression  Pt presents at prior level of functioning, mod I with RW for gait, independent with transfers.  Pt with no further acute care PT needs at this time.    PT Assessment  All further PT needs can be met in the next venue of care    Follow Up Recommendations  Home health PT (recommend pt continue with HHPT she had been getting prior to hospitalization)    Does the patient have the potential to tolerate intense rehabilitation      Barriers to Discharge        Equipment Recommendations  None recommended by PT    Recommendations for Other Services     Frequency      Precautions / Restrictions Restrictions Weight Bearing Restrictions: No   Pertinent Vitals/Pain No c/o pain. spO2 94% on 2L O2      Mobility  Bed Mobility Overal bed mobility: Independent Transfers Overall transfer level: Independent Ambulation/Gait Ambulation/Gait assistance: Modified independent (Device/Increase time) Ambulation Distance (Feet): 120 Feet Assistive device: Rolling walker (2 wheeled) Gait velocity interpretation: <1.8 ft/sec, indicative of risk for recurrent falls    Exercises     PT Diagnosis: Generalized weakness;Difficulty walking  PT Problem List: Decreased strength;Decreased activity tolerance;Decreased balance PT Treatment Interventions:       PT Goals(Current goals can be found in the care plan section) Acute Rehab PT Goals PT Goal Formulation: No goals set, d/c therapy  Visit Information  Last PT Received  On: 09/11/13 Assistance Needed: +1 History of Present Illness: [name] is a 80 y.o. female with Past medical history of coronary artery disease, CHF, diastolic dysfunction, atrial fibrillation, hypertension, COPD, sleep apnea.  Admitted with shortness of breath.       Prior [place] expects to be discharged to:: Private residence Living Arrangements: Spouse/significant other Available Help at Discharge: Family;Available 24 hours/day Type of Home: House Home Access: Stairs to enter [employer] of Steps: 1 Entrance Stairs-Rails: None Home Layout: Multi-level Alternate Level Stairs-Number of Steps: chair lift Home Equipment: Walker - 2 wheels;Walker - 4 wheels;Cane - single point Prior Function Level of Independence: Independent with assistive device(s) Communication Communication: No difficulties    Cognition  Cognition Arousal/Alertness: Awake/alert Behavior During Therapy: WFL for tasks assessed/performed Overall Cognitive Status: Within Functional Limits for tasks assessed    Extremity/Trunk Assessment Upper Extremity Assessment Upper Extremity Assessment: Generalized weakness Lower Extremity Assessment Lower Extremity Assessment: Generalized weakness Cervical / Trunk Assessment Cervical / Trunk Assessment: Normal   Balance    End of Session PT - End of Session Equipment Utilized During Treatment: Oxygen;Gait belt Activity Tolerance: Patient tolerated treatment well Patient left: in bed;with call bell/phone within reach  GP     [facility] 09/11/2013, 10:23 AM

## 2013-09-11 NOTE — Progress Notes (Signed)
[facility] PROGRESS NOTE  Filed Weights   09/09/13 2118 09/10/13 2119 09/11/13 0556  Weight: 76.431 kg (168 lb 8 oz) 75.569 kg (166 lb 9.6 oz) 76.25 kg (168 lb 1.6 oz)        Intake/Output Summary (Last 24 hours) at 09/11/13 4174 Last data filed at 09/11/13 1746  Gross per 24 hour  Intake   1110 ml  Output   1405 ml  Net   -295 ml    Assessment/Plan: 1-Acute on chronic resp failure due to acute exacerbation of chronic diastolic congestive heart failure: Due to diet indiscretion (patient admitted to be eating plenty of can soups lately since appetite was poor). -continue IV lasix X 1 more day, daily weight, strict I's and O's -continue sodium restriction -will repeat BNP in am  2-Pneumonitis/PNA: low on differential, no fever and normal WBC's. Will no provide abx's for now. -follow CXR in am  3-OBSTRUCTIVE SLEEP APNEA: patient use oxygen 24/7, unable to tolerate CPAP according to her  4-HYPOTHYROIDISM: continue synthroid  5-HYPERTENSION: stable. Will follow current medication regimen.  6-CAD: troponin negative. No CP -continue b-blockers and plavix  7-Atrial fibrillation: rate controlled. Continue b-blockers, cardizem and plavix.  8-DM: continue SSI and lantus  9-chronic resp failure due to COPD: stable.  -continue home O2 supplementation, pulmicort and PRN xopenex      Code Status: Full Family Communication: husband and sister in law at bedside Disposition Plan: to be determine   Consultants:  None   Procedures: ECHO: (from 05/2013) - Left ventricle: Wall thickness was increased in a pattern of mild LVH. Systolic function was vigorous. The estimated ejection fraction was in the range of 65% to 70%. Features are consistent with a pseudonormal left ventricular filling pattern, with concomitant abnormal relaxation and increased filling pressure (grade 2 diastolic dysfunction). Doppler parameters are consistent with high ventricular filling  pressure. - Aortic valve: Mild regurgitation. - Mitral valve: Severely calcified annulus. Mild to moderate regurgitation. - Left atrium: The atrium was mildly dilated. - Tricuspid valve: Moderate regurgitation. - Pulmonary arteries: Systolic pressure was moderately increased. PA peak pressure: 66mm Hg (S).  Antibiotics:  None   HPI/Subjective: Breathing continue to improved. No fever, denies orthopnea  Objective: Filed Vitals:   09/11/13 0556 09/11/13 0823 09/11/13 0850 09/11/13 1423  BP: 122/53  119/51 109/57  Pulse: 97 98 107 108  Temp: 97 F (36.1 C)  97.8 F (36.6 C) 97.2 F (36.2 C)  TempSrc: Oral  Oral Oral  Resp: 18 18 18 20   Height:      Weight: 76.25 kg (168 lb 1.6 oz)     SpO2: 97% 97% 98% 98%     Exam:  General: Alert, awake, oriented x3, in no acute distress. Afebrile and breathing a lot better.  HEENT: No bruits, no goiter. No JVD Heart: Regular rate and rhythm, without murmurs, rubs, gallops. Positive 1+ edema Lungs: decreased air movement at bases, but no frank crackles  Abdomen: Soft, nontender, nondistended, positive bowel sounds.  Neuro: Grossly intact, nonfocal.   Data Reviewed: Basic Metabolic Panel:  Recent Labs Lab 09/09/13 1740 09/10/13 0505  NA 137 140  K 4.5 3.9  CL 96 101  CO2 27 25  GLUCOSE 213* 188*  BUN 21 21  CREATININE 1.10 1.17*  CALCIUM 9.3 8.9   Liver Function Tests:  Recent Labs Lab 09/10/13 0505  AST 16  ALT 21  ALKPHOS 95  BILITOT 0.3  PROT 6.9  ALBUMIN 3.1*   CBC:  Recent Labs  Lab 09/09/13 1740 09/10/13 0505  WBC 6.1 6.4  HGB 9.9* 9.5*  HCT 31.1* 29.9*  MCV 90.7 91.4  PLT 285 274   Cardiac Enzymes:  Recent Labs Lab 09/09/13 2304 09/10/13 0505 09/10/13 1039  TROPONINI <0.30 <0.30 <0.30   BNP (last 3 results)  Recent Labs  06/13/13 0406 07/15/13 2101 09/09/13 1740  PROBNP 1503.0* 31366.0* 8058.0*   CBG:  Recent Labs Lab 09/10/13 1616 09/10/13 2135 09/11/13 0611 09/11/13 1143  09/11/13 1620  GLUCAP 217* 103* 143* 170* 145*    Studies: No results found.  Scheduled Meds: . allopurinol  100 mg Oral Daily  . azelastine  2 spray Each Nare Daily  . budesonide  0.25 mg Nebulization BID  . clopidogrel  75 mg Oral Daily  . colchicine  0.6 mg Oral Daily  . diltiazem  360 mg Oral Daily  . enoxaparin (LOVENOX) injection  40 mg Subcutaneous Q24H  . feeding supplement (PRO-STAT SUGAR FREE 64)  30 mL Oral Q24H  . furosemide  40 mg Oral BID  . insulin aspart  0-15 Units Subcutaneous TID WC  . insulin aspart  0-5 Units Subcutaneous QHS  . insulin glargine  20 Units Subcutaneous Daily  . ipratropium-albuterol  3 mL Nebulization BID  . isosorbide mononitrate  30 mg Oral Daily  . levothyroxine  25 mcg Oral QAC breakfast  . metoprolol  50 mg Oral BID  . multivitamin with minerals  1 tablet Oral Daily  . oxybutynin  10 mg Oral Daily   Continuous Infusions:   Time > 30 minutes  Abigael Mogle  [facility] Pager [phone]. If 8PM-8AM, please contact night-coverage at [email or web address], password [facility] 09/11/2013, 6:38 PM  LOS: 2 days

## 2013-09-12 ENCOUNTER — Inpatient Hospital Stay (HOSPITAL_COMMUNITY): Payer: Medicare Other

## 2013-09-12 DIAGNOSIS — I509 Heart failure, unspecified: Secondary | ICD-10-CM

## 2013-09-12 DIAGNOSIS — E43 Unspecified severe protein-calorie malnutrition: Secondary | ICD-10-CM

## 2013-09-12 DIAGNOSIS — E119 Type 2 diabetes mellitus without complications: Secondary | ICD-10-CM

## 2013-09-12 DIAGNOSIS — R5381 Other malaise: Secondary | ICD-10-CM

## 2013-09-12 LAB — BASIC METABOLIC PANEL
BUN: 30 mg/dL — ABNORMAL HIGH (ref 6–23)
CALCIUM: 9 mg/dL (ref 8.4–10.5)
CO2: 26 mEq/L (ref 19–32)
Chloride: 98 mEq/L (ref 96–112)
Creatinine, Ser: 1.23 mg/dL — ABNORMAL HIGH (ref 0.50–1.10)
GFR calc non Af Amer: 41 mL/min — ABNORMAL LOW (ref >90)
GFR, EST AFRICAN AMERICAN: 48 mL/min — AB (ref >90)
GLUCOSE: 169 mg/dL — AB (ref 70–99)
Potassium: 4.6 mEq/L (ref 3.7–5.3)
SODIUM: 139 meq/L (ref 137–147)

## 2013-09-12 LAB — PRO B NATRIURETIC PEPTIDE: Pro B Natriuretic peptide (BNP): 3992 pg/mL — ABNORMAL HIGH (ref 0–450)

## 2013-09-12 LAB — GLUCOSE, CAPILLARY
GLUCOSE-CAPILLARY: 136 mg/dL — AB (ref 70–99)
Glucose-Capillary: 169 mg/dL — ABNORMAL HIGH (ref 70–99)
Glucose-Capillary: 161 mg/dL — ABNORMAL HIGH (ref 70–99)

## 2013-09-12 MED ORDER — FUROSEMIDE 40 MG PO TABS: 40.0000 mg | ORAL_TABLET | Freq: Two times a day (BID) | ORAL | Status: DC

## 2013-09-12 MED ORDER — PRO-STAT SUGAR FREE PO LIQD: 30.0000 mL | ORAL | Status: DC

## 2013-09-12 NOTE — Progress Notes (Signed)
   CARE MANAGEMENT NOTE 09/12/2013  Patient:  KIMORAH, RIDOLFI   Account Number:  [id number]  Date Initiated:  09/10/2013  Documentation initiated by:  [facility]  Subjective/Objective Assessment:   80 y.o. female with PMHx of CAD, CHF, diastolic dysfunction, atrial fibrillation, HTN, COPD, sleep apnea. Presented with complaints of cough; SOB; cp./Home with spouse     Action/Plan:   IV diureses, PT consult//Home with HH vs SNF   Anticipated DC Date:  09/13/2013   Anticipated DC Plan:  [facility] referral  Clinical Social Worker      DC Planning Services  CM consult      [facility] Choice  Resumption Of Svcs/PTA Provider   Choice offered to / List presented to:          [facility] arranged  HH-1 RN  [facility]      [place] agency  [place]   Status of service:  Completed, signed off Medicare Important Message given?  YES (If response is "NO", the following Medicare IM given date fields will be blank) Date Medicare IM given:   Date Additional Medicare IM given:  09/11/2013  Discharge Disposition:  [place]  Per UR Regulation:    If discussed at Long Length of Stay Meetings, dates discussed:    Comments:  09/12/13 16:00 CM notified [facility] of pt discharge.  No other CM needs were communicated.  Mariane Masters, BSN, [place] [phone].   09/10/13 [place], RN, BSN, [employer] [phone] Pt currently active with [facility] for RN/PT services.  Resumption of care requested if correaltes with PT reccomendations.  Christa See, RN of [facility] notified.  No DME needs identified at this time.

## 2013-09-12 NOTE — Discharge Summary (Signed)
Physician Discharge Summary  [name] [id number] DOB: 1934-06-14 DOA: 09/09/2013  PCP: Cari Caraway, MD  Admit date: 09/09/2013 Discharge date: 09/12/2013  Time spent: >30 minutes  Recommendations for Outpatient Follow-up:  1. BMET to follow electrolytes and renal function 2. Reassess BP and adjust meds as needed  BNP    Component Value Date/Time   PROBNP 3992.0* 09/12/2013 0255   Filed Weights   09/10/13 0613 09/11/13 0556 09/12/13 0433  Weight: 75.569 kg (166 lb 9.6 oz) 76.25 kg (168 lb 1.6 oz) 77.61 kg (171 lb 1.6 oz)     Discharge Diagnoses:  Acute on chronic resp failure Acute on chronic diastolic congestive heart failure HYPOTHYROIDISM DM OBSTRUCTIVE SLEEP APNEA HYPERTENSION CAD Atrial fibrillation    Discharge Condition: stable and improved. Will discharge home with home health services.  Diet recommendation: low sodium diet  History of present illness:  80 y.o. female with Past medical history of coronary artery disease, CHF, diastolic dysfunction, atrial fibrillation, hypertension, COPD, sleep apnea.  The patient is coming from home.  The patient presented with complaints of cough with shortness of breath. She also right-sided chest pain. She mentions that she was at her baseline until Saturday. Since Sunday she had an episode of cough with whitish expectoration. Monday she had complaints of right-sided pain which felt like a dull ache without any pleuritic component. Throughout this episode she had complaints of shortness of breath on her exertion. And today her breathing was significantly worse and therefore she came to her PCPs office where after performing a chest x-ray she was sent here for further workup.  She was admitted in December for healthcare associated pneumonia, she was found to have mild dysphagia and was recommended on mechanical soft diet. She was discharged on Levaquin and steroids. She had another course of Levaquin in late December  and early January.  Recently she had thirty-day cardiac monitoring which showed atrial fibrillation and she was placed on Plavix, Cardizem and her Lopressor dose was reduced from 100-50 mg twice a day.  She mentions she has been taking Lasix twice a day. She has gained 3 pounds over last 3 days along with bilateral leg swelling.  She denies any fever or chills. She also denies any aspiration.  She complains of orthopnea but denies any PND.    Hospital Course:  1-Acute on chronic resp failure due to acute exacerbation of chronic diastolic congestive heart failure:  Due to diet indiscretion (patient admitted to be eating plenty of can soups lately since appetite was poor).  -now breathing good and weight essentially at dry weight -no crackles, no JVD and just trace edema -continue sodium restriction diet and daily weight at home -BNP 3000 range at discharge -CXR w/o vascular congestion, infiltrates or interstitial edema -patient neg 6.5 L since admission -Weight not correlating with physical exam and symptoms improvements  2-Pneumonitis/PNA: low on differential, no fever and normal WBC's. Will no provide abx's for now.  -repeated CXR w/o infiltrates   3-OBSTRUCTIVE SLEEP APNEA: patient use oxygen 24/7, unable to tolerate CPAP according to her. -continue oxygen supplementation   4-HYPOTHYROIDISM: continue synthroid  -TSH WNL  5-HYPERTENSION: stable. Will follow current medication regimen.  -advise to follow low sodium diet  6-CAD: troponin negative. No CP  -continue b-blockers and plavix   7-Atrial fibrillation: rate controlled. Continue b-blockers, cardizem and plavix.   8-DM: continue SSI and lantus   9-chronic resp failure due to COPD: stable.  -continue home O2 supplementation, pulmicort and PRN xopenex  10-Physical deconditioning: will discharge home with [facility] services.   Procedures:  See below for x-ray reports   Consultations:  None   Discharge Exam: Filed  Vitals:   09/12/13 0433  BP: 125/49  Pulse: 93  Temp: 97.8 F (36.6 C)  Resp: 20   General: Alert, awake, oriented x3, in no acute distress. Afebrile and breathing a lot better.  HEENT: No bruits, no goiter. No JVD  Heart: Regular rate and rhythm, without murmurs, rubs, gallops. Positive trace edema  Lungs: improved air movement, no crackles, no rales  Abdomen: Soft, nontender, nondistended, positive bowel sounds.  Neuro: Grossly intact, nonfocal.   Discharge Instructions  Discharge Orders   Future Appointments Provider Department Dept Phone   09/18/2013 11:00 AM [place] Berton Lan, MD [place] [phone]   10/22/2013 1:30 PM Deneise Lever, MD [facility] [phone]   Future Orders Complete By Expires   Contraindication to ACEI at discharge  As directed    Scheduling Instructions:     EF is > 40% and patient with already controlled BP and hx of renal failure   Diet - low sodium heart healthy  As directed    Discharge instructions  As directed    Comments:     Take medications as prescribed Follow a low sodium diet (< 2 grams daily) Arrange follow up with PCP in 1 week Daily weight (contact cardiology office if weight up 3 pounds overnight or 5 pounds in 1 week)   Face-to-face encounter (required for Medicare/Medicaid patients)  As directed    Comments:     I Stephanie Littman certify that this patient is under my care and that I, or a nurse practitioner or physician's assistant working with me, had a face-to-face encounter that meets the physician face-to-face encounter requirements with this patient on 09/12/2013. The encounter with the patient was in whole, or in part for the following medical condition(s) which is the primary reason for home health care (List medical condition): heart failure and deconditioning   Questions:     The encounter with the patient was in whole, or in part, for the following medical condition, which is  the primary reason for home health care:  heart failure and deconditioning   I certify that, based on my findings, the following services are medically necessary home health services:  Nursing   Physical therapy   My clinical findings support the need for the above services:  Unable to leave home safely without assistance and/or assistive device   Further, I certify that my clinical findings support that this patient is homebound due to:  Unable to leave home safely without assistance   Reason for Medically Necessary Home Health Services:  Skilled Nursing- Change/Decline in Patient Status   Skilled Nursing- Changes in Medication/Medication Management   Heart Failure patients record your daily weight using the same scale at the same time of day  As directed    [place]  As directed    Questions:     To provide the following care/treatments:  RN   PT       Medication List    STOP taking these medications       influenza vac split quadrivalent PF 0.5 ML injection  Commonly known as:  FLUARIX      TAKE these medications       acetaminophen 325 MG tablet  Commonly known as:  TYLENOL  Take 2 tablets (650 mg total) by mouth  every 6 (six) hours as needed for mild pain or headache.     allopurinol 100 MG tablet  Commonly known as:  ZYLOPRIM  Take 1 tablet (100 mg total) by mouth daily.     azelastine 137 MCG/SPRAY nasal spray  Commonly known as:  ASTELIN  Place 2 sprays into the nose daily. Use in each nostril as directed     budesonide 0.25 MG/2ML nebulizer solution  Commonly known as:  PULMICORT  Take 2 mLs (0.25 mg total) by nebulization 2 (two) times daily.     clopidogrel 75 MG tablet  Commonly known as:  PLAVIX  Take 1 tablet (75 mg total) by mouth daily.     colchicine 0.6 MG tablet  Take 0.6 mg by mouth daily. Takes as needed for gout attacks.     diltiazem 360 MG 24 hr capsule  Commonly known as:  CARDIZEM CD  Take 1 capsule (360 mg total) by mouth daily.      DITROPAN XL 10 MG 24 hr tablet  Generic drug:  oxybutynin  Take 10 mg by mouth daily.     feeding supplement (PRO-STAT SUGAR FREE 64) Liqd  Take 30 mLs by mouth daily.     furosemide 40 MG tablet  Commonly known as:  LASIX  Take 1 tablet (40 mg total) by mouth 2 (two) times daily. Contact cardiology office or PCP if you experienced > 3 pounds overnight or > 5 pounds in 1 week for further instructions     insulin aspart 100 UNIT/ML injection  Commonly known as:  novoLOG  - Check CBG before meals.  - CBG 121-150 give 2 units   - CBG 151-200 give 3 units  - CBG 201-250 give 5 units  - CBG 251-300 give 8 units  - CBG 301-350 give 11 units  - CBG 351-400 give 15 units  - If CBG > 400 CALL MD     insulin glargine 100 UNIT/ML injection  Commonly known as:  LANTUS  Inject 0.2 mLs (20 Units total) into the skin daily.     isosorbide mononitrate 30 MG 24 hr tablet  Commonly known as:  IMDUR  1 tablet by mouth daily     levalbuterol 0.63 MG/3ML nebulizer solution  Commonly known as:  XOPENEX  Take 3 mLs (0.63 mg total) by nebulization every 6 (six) hours as needed for wheezing or shortness of breath.     levothyroxine 50 MCG tablet  Commonly known as:  SYNTHROID, LEVOTHROID  Take 50 mcg by mouth See admin instructions. Take 1 tablet (50 mcg) on Tuesday, Thursday, Saturday, Sunday (take 25 mcg on Monday, Wednesday and Friday)     levothyroxine 25 MCG tablet  Commonly known as:  SYNTHROID, LEVOTHROID  Take 25 mcg by mouth See admin instructions. Take 1 tablet (25 mcg) on Monday, Wednesday, Friday (take 50 mcg on Tues, Thurs, Sat, Sun)     LORazepam 0.5 MG tablet  Commonly known as:  ATIVAN  Take 0.25 mg by mouth 3 (three) times daily.     metoprolol 50 MG tablet  Commonly known as:  LOPRESSOR  1 tablet by mouth twice a day     nitroGLYCERIN 0.4 MG SL tablet  Commonly known as:  NITROSTAT  Place 0.4 mg under the tongue every 5 (five) minutes as needed for chest pain.      ondansetron 4 MG tablet  Commonly known as:  ZOFRAN  Take 4 mg by mouth every 8 (eight) hours as needed for  nausea or vomiting.     zolpidem 5 MG tablet  Commonly known as:  AMBIEN  Take one tablet by mouth every night at bedtime as needed for sleep       Allergies  Allergen Reactions  . Cefuroxime Axetil Shortness Of Breath, Swelling and Rash    Throat swelling  . Cephalexin Shortness Of Breath, Swelling and Rash    Throat swelling  . Clarithromycin Shortness Of Breath, Swelling and Rash    Throat swelling  . Doxycycline Shortness Of Breath, Swelling and Rash     rash on legs and feet, throat swelling  . Erythromycin Shortness Of Breath, Swelling and Rash    Throat swelling  . Penicillins Shortness Of Breath, Swelling and Rash    Throat swelling  . Tetracycline Shortness Of Breath, Swelling and Rash    Throat swelling       Follow-up Information   Follow up with [facility]. (RN/PT)    Contact information:   [address] [place] [address] [phone]       Follow up with MCNEILL,WENDY, MD. Schedule an appointment as soon as possible for a visit in 1 week.   Specialty:  Family Medicine   Contact information:   [place] [address] [phone]       The results of significant diagnostics from this hospitalization (including imaging, microbiology, ancillary and laboratory) are listed below for reference.    Significant Diagnostic Studies: Dg Chest 2 View  09/12/2013   CLINICAL DATA:  Shortness of breath on admission, history COPD, bronchitis, diabetes, hypertension, coronary artery disease, CHF  EXAM: CHEST  2 VIEW  COMPARISON:  On twenty-eighth 1,015  FINDINGS: Enlargement of cardiac silhouette with pulmonary vascular congestion.  Mitral annular calcification noted.  Atherosclerotic calcification aorta.  Coronary arterial stents noted at expected position of LAD coronary artery.  Minimal chronic accentuation of  interstitial markings  No acute infiltrate, pleural effusion or pneumothorax.  Degenerative changes left glenohumeral joint with prior right shoulder arthroplasty.  Diffuse osseous demineralization.  IMPRESSION: Enlargement of cardiac silhouette post coronary stenting.  No definite acute infiltrate.   Electronically Signed   By: Lavonia Dana M.D.   On: 09/12/2013 11:43   Dg Chest 2 View  09/09/2013   CLINICAL DATA:  Shortness of breath, chest pain  EXAM: CHEST  2 VIEW  COMPARISON:  [facility] chest radiographs dated 09/09/2013  FINDINGS: Increased interstitial markings, at least some of which is likely chronic. Superimposed pneumonia is difficult to exclude, particularly in the left lower lobe/lingula. Interstitial edema is not excluded but is considered unlikely. No pleural effusion or pneumothorax.  Cardiomegaly.  Coronary stent.  Degenerative changes of the visualized thoracolumbar spine. Stable lower thoracic compression fracture deformity.  Cholecystectomy clips.  Right shoulder arthroplasty.  IMPRESSION: Increased interstitial markings, at least some of which is likely chronic.  Superimposed pneumonia in the left lower lobe/lingula is not excluded.  Mild interstitial edema is considered unlikely.   Electronically Signed   By: Julian Hy M.D.   On: 09/09/2013 18:27   Labs: Basic Metabolic Panel:  Recent Labs Lab 09/09/13 1740 09/10/13 0505 09/12/13 0255  NA 137 140 139  K 4.5 3.9 4.6  CL 96 101 98  CO2 27 25 26   GLUCOSE 213* 188* 169*  BUN 21 21 30*  CREATININE 1.10 1.17* 1.23*  CALCIUM 9.3 8.9 9.0   Liver Function Tests:  Recent Labs Lab 09/10/13 0505  AST 16  ALT 21  ALKPHOS 95  BILITOT 0.3  PROT 6.9  ALBUMIN 3.1*   CBC:  Recent Labs Lab 09/09/13 1740 09/10/13 0505  WBC 6.1 6.4  HGB 9.9* 9.5*  HCT 31.1* 29.9*  MCV 90.7 91.4  PLT 285 274   Cardiac Enzymes:  Recent Labs Lab 09/09/13 2304 09/10/13 0505 09/10/13 1039  TROPONINI <0.30 <0.30  <0.30   BNP: BNP (last 3 results)  Recent Labs  07/15/13 2101 09/09/13 1740 09/12/13 0255  PROBNP 31366.0* 8058.0* 3992.0*   CBG:  Recent Labs Lab 09/11/13 1143 09/11/13 1620 09/11/13 2011 09/12/13 0442 09/12/13 1202  GLUCAP 170* 145* 136* 169* 161*    Signed:  Durant Scibilia  [facility] 09/12/2013, 12:50 PM

## 2013-09-15 ENCOUNTER — Telehealth: Payer: Self-pay | Admitting: Interventional Cardiology

## 2013-09-15 NOTE — Telephone Encounter (Signed)
New message   Patient discharged from hospital yesterday . Was seen by home health today .   Skilled nursing will continue as  following   education , medication , heart failure.

## 2013-09-15 NOTE — Telephone Encounter (Signed)
Fyi to Dr. Irish Lack.

## 2013-09-18 ENCOUNTER — Ambulatory Visit: Payer: Medicare Other | Admitting: Obstetrics and Gynecology

## 2013-09-24 ENCOUNTER — Other Ambulatory Visit: Payer: Self-pay | Admitting: *Deleted

## 2013-09-24 MED ORDER — ESTRADIOL 2 MG VA RING: 2.0000 mg | VAGINAL_RING | VAGINAL | Status: DC

## 2013-09-24 NOTE — Telephone Encounter (Signed)
Last AEX 05/19/2012 Last refill 07/18/2012 1/3 refills. Next appt 10/01/2013.

## 2013-09-29 ENCOUNTER — Other Ambulatory Visit: Payer: Self-pay | Admitting: *Deleted

## 2013-09-29 NOTE — Telephone Encounter (Signed)
AEX scheduled for 10/01/13 s/w patient she has 2 rings left - Denied electronically through our system.

## 2013-09-29 NOTE — Telephone Encounter (Signed)
I understand from your notes that the patient has enough until her appointment in 2 days. I will close the encounter.

## 2013-10-01 ENCOUNTER — Ambulatory Visit: Payer: Medicare Other | Admitting: Obstetrics and Gynecology

## 2013-10-02 ENCOUNTER — Telehealth: Payer: Self-pay | Admitting: Cardiology

## 2013-10-02 NOTE — Telephone Encounter (Signed)
Dr. Hassell Done Interpretation: Afib 75% of the time. Bradycardia during sleep. Avg heart rate controlled.

## 2013-10-02 NOTE — Telephone Encounter (Signed)
Pts daughter Druscilla Brownie notified.

## 2013-10-07 NOTE — Telephone Encounter (Signed)
Follow up     Need an order for re certification for skilled nursing for 1 x weeks for 7 weeks - continue education monitior chf, copd, education.     Patient took an extra lasix on yesterday gain 3 lbs.   No problem now, no edema

## 2013-10-07 NOTE — Telephone Encounter (Signed)
Noted. I think form was signed and faxed yesterday.

## 2013-10-07 NOTE — Telephone Encounter (Signed)
LM for [name] with [facility] to return call

## 2013-10-15 NOTE — Telephone Encounter (Signed)
Spoke with Arbie Cookey and order will be faxed to be signed by Dr. Irish Lack.

## 2013-10-20 ENCOUNTER — Ambulatory Visit: Payer: Medicare Other | Admitting: Interventional Cardiology

## 2013-10-21 ENCOUNTER — Ambulatory Visit: Payer: Medicare Other | Admitting: Interventional Cardiology

## 2013-10-22 ENCOUNTER — Ambulatory Visit: Payer: Medicare Other | Admitting: Internal Medicine

## 2013-10-28 ENCOUNTER — Telehealth: Payer: Self-pay

## 2013-10-28 NOTE — Telephone Encounter (Signed)
To Dr. Varanasi, please advise.  

## 2013-10-29 ENCOUNTER — Ambulatory Visit (INDEPENDENT_AMBULATORY_CARE_PROVIDER_SITE_OTHER): Payer: Medicare Other | Admitting: Obstetrics and Gynecology

## 2013-10-29 ENCOUNTER — Encounter: Payer: Self-pay | Admitting: Obstetrics and Gynecology

## 2013-10-29 VITALS — BP 122/64 | HR 70 | Ht 59.5 in | Wt 164.2 lb

## 2013-10-29 DIAGNOSIS — Z780 Asymptomatic menopausal state: Secondary | ICD-10-CM

## 2013-10-29 DIAGNOSIS — Z9223 Personal history of estrogen therapy: Secondary | ICD-10-CM

## 2013-10-29 DIAGNOSIS — N951 Menopausal and female climacteric states: Secondary | ICD-10-CM

## 2013-10-29 DIAGNOSIS — Z124 Encounter for screening for malignant neoplasm of cervix: Secondary | ICD-10-CM

## 2013-10-29 DIAGNOSIS — Z01419 Encounter for gynecological examination (general) (routine) without abnormal findings: Secondary | ICD-10-CM

## 2013-10-29 DIAGNOSIS — Z1239 Encounter for other screening for malignant neoplasm of breast: Secondary | ICD-10-CM

## 2013-10-29 NOTE — Patient Instructions (Signed)

## 2013-10-29 NOTE — Progress Notes (Signed)
Patient ID: [name], female   DOB: [birth year], 80 y.o.   MRN: [id number] GYNECOLOGY VISIT  PCP:   Cari Caraway, MD  Referring provider:   HPI: 80 y.o.   Married  Caucasian  female   G0P0 with Patient's last menstrual period was 08/13/1957.   here for AEX.   Patient hospitalized for pneumonia and congestive heart failure.  Patient having atrial fibrillation and ventricular tachycardia.  Patient has been seen by her physician this week who is aware of her irregular heart beat. Lots of health problems for the patient's husband as well.  Patient using Estring  Came in for last visit in October 2014. Has not changed Estring since then.   Took Fosamax for many years and this did not work for treating osteoporosis.  Taking Boniva.  Taking Vitamin D. Does not consume much calcium.  Hgb:    PCP Urine:  PCP  GYNECOLOGIC HISTORY: Patient's last menstrual period was 08/13/1957. Sexually active:  no Partner preference: female Contraception:   postmenopausal Menopausal hormone therapy: Estring DES exposure:  no  Blood transfusions:   Yes, due to anemia Sexually transmitted diseases:  no  GYN procedures and prior surgeries:  D & C, Bilateral salpingo-oophorectomy due to ovarian cancer. Last mammogram: 2012 at [facility] pap and high risk HPV testing:   12-28-08 wnl:no HR HPV testing History of abnormal pap smear:  no   OB History   Grav Para Term Preterm Abortions TAB SAB Ect Mult Living   0              Obstetric Comments   2 adopted children       LIFESTYLE: Exercise:  no             Tobacco: former smoker Alcohol:   no Drug use:  no  OTHER HEALTH MAINTENANCE: Tetanus/TDap:   unsure Gardisil:              n/a Influenza:            05/2013 Zostavax:             Completed with PCP along with Pneumonia vaccine  Bone density:     2012 [facility]:osteoporosis Colonoscopy:     09/2007 revealed polyps.  Her GI will not do another  colonoscopy on her due To her heart problems.  Cholesterol check:   Checked with PCP  Family History  Problem Relation Age of Onset  . Pneumonia Mother   . Heart disease Mother   . Allergies Mother   . Emphysema Mother   . Arthritis Mother   . Hypertension Mother   . Heart attack Father   . Hypertension Father   . Diabetes Sister   . Heart disease Sister   . Diabetes Brother   . Diabetes      grandfather  . Breast cancer      aunt  . Bone cancer      aunt  . Clotting disorder Brother   . Arthritis Sister   . Breast cancer Maternal Aunt     Patient Active Problem List   Diagnosis Date Noted  . CHF (congestive heart failure) 09/09/2013  . Acute on chronic diastolic congestive heart failure 09/09/2013  . Pneumonitis 08/28/2013  . Other nonspecific abnormal serum enzyme levels 08/02/2013  . Osteoporosis 07/29/2013  . Constipation 07/29/2013  . Overactive bladder 07/29/2013  . Anxiety 07/29/2013  .  Type II or unspecified type diabetes mellitus with renal manifestations, not stated as uncontrolled(250.40) 07/22/2013  . Secondary renovascular hypertension, benign 07/22/2013  . Acute and chronic respiratory failure with hypoxia 07/16/2013  . Hyperkalemia 07/16/2013  . HCAP (healthcare-associated pneumonia) 07/15/2013  . Protein-calorie malnutrition, severe 07/10/2013  . Renal failure 07/09/2013  . Acute renal failure 07/09/2013  . Metabolic acidosis [id number]  . Nausea vomiting and diarrhea 07/09/2013  . Secondary pulmonary hypertension 06/09/2013  . Hyposmolality and/or hyponatremia 06/09/2013  . Acute on chronic diastolic heart failure [id number]  . CAP (community acquired pneumonia) 06/08/2013  . COPD with acute exacerbation 06/07/2013  . CHF (congestive heart failure), NYHA class II 06/07/2013  . Acute diastolic heart failure   . Allergic rhinitis due to pollen 01/11/2011  . OBSTRUCTIVE SLEEP APNEA 04/09/2010  . HYPOTHYROIDISM 02/09/2010  . HYPERTENSION  02/09/2010  . CAD 02/09/2010  . Atrial fibrillation 02/09/2010  . BRADYCARDIA 02/09/2010  . CHF 02/09/2010  . RENAL ARTERY STENOSIS 02/09/2010  . HYPOTENSION 02/09/2010  . COPD 02/09/2010  . GENERALIZED OSTEOARTHROSIS UNSPECIFIED SITE 02/09/2010  . OBESITY HYPOVENTILATION SYNDROME 12/01/2009  . PRURITUS 07/29/2009  . TRACHEOBRONCHITIS 09/28/2008  . RHINOSINUSITIS, ACUTE 11/07/2007  . LUNG NODULE 11/07/2007  . ANEMIA 08/25/2007  . Hypoxemia 08/25/2007  . DM 08/19/2007  . ENDOGENOUS OBESITY 08/19/2007  . PERIPHERAL EDEMA 08/19/2007  . ASTHMA, INTRINSIC, WITH ACUTE EXACERBATION 08/15/2007  . ACID REFLUX DISEASE 08/15/2007  . DYSPNEA 08/15/2007   Past Medical History  Diagnosis Date  . CAD (coronary artery disease)   . CHF (congestive heart failure)   . Paroxysmal atrial fibrillation   . Bradycardia   . Hypertension   . Renal artery stenosis   . COPD (chronic obstructive pulmonary disease)   . Obesity hypoventilation syndrome   . Pruritus   . Tracheobronchitis   . Acute rhinosinusitis   . Anemia   . Hypoxemia   . Peripheral edema   . Obesity, endogenous   . Diabetes mellitus   . Acid reflux disease   . Osteoarthritis, generalized   . Hypothyroidism   . Lung nodule     lingula  . OSA (obstructive sleep apnea)     NPSG 04/03/00--AHI 28.hr  . DVT (deep venous thrombosis) 2003    left leg  . Vaginal atrophy   . Hx of colonic polyps   . Acute diastolic heart failure   . Secondary pulmonary hypertension 06/09/2013  . Blood transfusion without reported diagnosis 2009    had internal bleeding  . Cancer 1959    ovarian cancer  . Osteoporosis     Past Surgical History  Procedure Laterality Date  . Lumbar spine surgery    . Cholecystectomy    . Hemorroidectomy    . Repair wound fistula    . Right shoulder    . Elbow surgery    . Dilation and curettage of uterus    . Breast surgery  1998    lumpectomy  . Oophorectomy  1959    BSO-? ovarian cancer  . Bilateral  salpingoophorectomy  1959    -d/t ovarian cancer  . Rotator cuff repair Right     ALLERGIES: Cefuroxime axetil; Cephalexin; Clarithromycin; Doxycycline; Erythromycin; Penicillins; and Tetracycline  Current Outpatient Prescriptions  Medication Sig Dispense Refill  . acetaminophen (TYLENOL) 325 MG tablet Take 2 tablets (650 mg total) by mouth every 6 (six) hours as needed for mild pain or headache.      . allopurinol (ZYLOPRIM) 100 MG tablet Take 1 tablet (100  mg total) by mouth daily.      Marland Kitchen azelastine (ASTELIN) 137 MCG/SPRAY nasal spray Place 2 sprays into the nose daily. Use in each nostril as directed  90 mL  3  . budesonide (PULMICORT) 0.25 MG/2ML nebulizer solution Take 2 mLs (0.25 mg total) by nebulization 2 (two) times daily.  60 mL  12  . clopidogrel (PLAVIX) 75 MG tablet Take 1 tablet (75 mg total) by mouth daily.  90 tablet  3  . colchicine 0.6 MG tablet Take 0.6 mg by mouth daily. Takes as needed for gout attacks.      . diltiazem (CARDIZEM CD) 360 MG 24 hr capsule Take 1 capsule (360 mg total) by mouth daily.  90 capsule  3  . estradiol (ESTRING) 2 MG vaginal ring Place 2 mg vaginally every 3 (three) months. follow package directions  1 each  3  . furosemide (LASIX) 40 MG tablet Take 1 tablet (40 mg total) by mouth 2 (two) times daily. Contact cardiology office or PCP if you experienced > 3 pounds overnight or > 5 pounds in 1 week for further instructions      . insulin aspart (NOVOLOG) 100 UNIT/ML injection Check CBG before meals. CBG 121-150 give 2 units  CBG 151-200 give 3 units CBG 201-250 give 5 units CBG 251-300 give 8 units CBG 301-350 give 11 units CBG 351-400 give 15 units If CBG > 400 CALL MD  1 vial  12  . insulin glargine (LANTUS) 100 UNIT/ML injection Inject 0.2 mLs (20 Units total) into the skin daily.  10 mL  12  . isosorbide mononitrate (IMDUR) 30 MG 24 hr tablet 1 tablet by mouth daily  90 tablet  3  . levothyroxine (SYNTHROID, LEVOTHROID) 25 MCG tablet Take 25  mcg by mouth See admin instructions. Take 1 tablet (25 mcg) on Monday, Wednesday, Friday (take 50 mcg on Tues, Thurs, Sat, Sun)      . levothyroxine (SYNTHROID, LEVOTHROID) 50 MCG tablet Take 50 mcg by mouth See admin instructions. Take 1 tablet (50 mcg) on Tuesday, Thursday, Saturday, Sunday (take 25 mcg on Monday, Wednesday and Friday)      . metoprolol (LOPRESSOR) 50 MG tablet 1 tablet by mouth twice a day  180 tablet  3  . nitroGLYCERIN (NITROSTAT) 0.4 MG SL tablet Place 0.4 mg under the tongue every 5 (five) minutes as needed for chest pain.       Marland Kitchen oxybutynin (DITROPAN XL) 10 MG 24 hr tablet Take 10 mg by mouth daily.       Marland Kitchen levalbuterol (XOPENEX) 0.63 MG/3ML nebulizer solution Take 3 mLs (0.63 mg total) by nebulization every 6 (six) hours as needed for wheezing or shortness of breath.  3 mL  12  . ondansetron (ZOFRAN) 4 MG tablet Take 4 mg by mouth every 8 (eight) hours as needed for nausea or vomiting.       No current facility-administered medications for this visit.     ROS:  Pertinent items are noted in HPI.  SOCIAL HISTORY:  Married.   PHYSICAL EXAMINATION:    BP 122/64  Pulse 70  Ht 4' 11.5" (1.511 m)  Wt 164 lb 3.2 oz (74.481 kg)  BMI 32.62 kg/m2  LMP 08/13/1957   Wt Readings from Last 3 Encounters:  10/29/13 164 lb 3.2 oz (74.481 kg)  09/12/13 171 lb 1.6 oz (77.61 kg)  07/31/13 174 lb (78.926 kg)     Ht Readings from Last 3 Encounters:  10/29/13 4' 11.5" (1.511  m)  09/09/13 5' 1.25" (1.556 m)  07/31/13 5' (1.524 m)    General appearance: alert, cooperative and appears stated age.  Using a walker.  Head: Normocephalic, without obvious abnormality, atraumatic Neck: no adenopathy, supple, symmetrical, trachea midline and thyroid not enlarged, symmetric, no tenderness/mass/nodules Lungs: clear to auscultation bilaterally Breasts: Inspection negative, No nipple retraction or dimpling, No nipple discharge or bleeding, No axillary or supraclavicular adenopathy, Normal  to palpation without dominant masses Heart: irregularly irregular. Abdomen: soft, non-tender; no masses,  no organomegaly Extremities: extremities normal, atraumatic, no cyanosis or edema Skin: Skin color, texture, turgor normal. No rashes or lesions Lymph nodes: Cervical, supraclavicular, and axillary nodes normal. No abnormal inguinal nodes palpated Neurologic: Grossly normal  Estring removed and discarded.  Pelvic: External genitalia:  no lesions              Urethra:  normal appearing urethra with no masses, tenderness or lesions              Bartholins and Skenes: normal                 Vagina: normal appearing vagina with normal color and discharge, no lesions              Cervix: normal appearance              Pap and high risk HPV testing done: no.            Bimanual Exam:  Uterus:  uterus is normal size, shape, consistency and nontender                                      Adnexa:  no masses                                      Rectovaginal: Confirms                                      Anus:  normal sphincter tone, no lesions  ASSESSMENT  Normal gynecologic exam. Estring patient.  Atrial fibrillation, ventricular tachycardia, CHF, history of DVT. Osteoporosis.  PLAN  Mammogram recommended yearly.  Patient will call to schedule at the [facility].  Pap smear and high risk HPV testing not indicated.  Counseled on self breast exam, Calcium and vitamin D intake, weight bearing activity.  Stop using Estring.  Follow up with PCP for osteoporosis care.  Return annually or prn   An After Visit Summary was printed and given to the patient.

## 2013-11-02 NOTE — Telephone Encounter (Signed)
Suggestions?

## 2013-11-02 NOTE — Telephone Encounter (Signed)
Alopecia is a side effect associated with beta blockers and seems to be a class effect.  She is already on diltiazem for rate control as well, so this isn't an option.  I haven't seen the incidence of alopecia be as bad with Bystolic, but cost of this medication sometimes prevents its use (would try Bystolic 5 mg instead of metoprolol 50 mg if you felt this change was appropriate).  Patient can also try to add biotin 5 mg daily to help with hair growth.

## 2013-11-02 NOTE — Telephone Encounter (Signed)
Pt notified. Pt is already taking Biotin. Pt will speak with insurance company regarding cost and she will call back if she would like to switch to bystolic.

## 2013-11-05 ENCOUNTER — Ambulatory Visit (INDEPENDENT_AMBULATORY_CARE_PROVIDER_SITE_OTHER): Payer: Medicare Other | Admitting: Internal Medicine

## 2013-11-05 ENCOUNTER — Encounter: Payer: Self-pay | Admitting: Internal Medicine

## 2013-11-05 VITALS — BP 122/64 | HR 63 | Ht 63.0 in | Wt 163.6 lb

## 2013-11-05 DIAGNOSIS — R0902 Hypoxemia: Secondary | ICD-10-CM

## 2013-11-05 DIAGNOSIS — J301 Allergic rhinitis due to pollen: Secondary | ICD-10-CM

## 2013-11-05 DIAGNOSIS — J9620 Acute and chronic respiratory failure, unspecified whether with hypoxia or hypercapnia: Secondary | ICD-10-CM

## 2013-11-05 DIAGNOSIS — G4733 Obstructive sleep apnea (adult) (pediatric): Secondary | ICD-10-CM

## 2013-11-05 DIAGNOSIS — I4891 Unspecified atrial fibrillation: Secondary | ICD-10-CM

## 2013-11-05 DIAGNOSIS — J9621 Acute and chronic respiratory failure with hypoxia: Secondary | ICD-10-CM

## 2013-11-05 MED ORDER — FLUTICASONE PROPIONATE 50 MCG/ACT NA SUSP: NASAL | Status: DC

## 2013-11-05 MED ORDER — AZELASTINE HCL 0.1 % NA SOLN: NASAL | Status: AC

## 2013-11-05 NOTE — Progress Notes (Signed)
Patient ID: [name], female    DOB: January 28, 1934, 80 y.o.   MRN: [id number]  HPI 01/11/11- 80 yo former smoker followed for OSA, OHS, occasional tracheobronchitis.Complicated by CAD/AFib, morbid obesity Last here Jun 15, 2010  - note reviewed. Denies significant changes. O2 dependent due to OHS. Complains of allergy with watering eyes and nose. Right ear hurts. Cardiologist added lisinopril for BP- discussed ACEI side effects.  Continues to use CPAP 16 every night with O2 2 L/M  Going to PT rehab to strengthen to reduce fall risk. Heat bothering her.  07/13/11-  80 yo former smoker followed for OSA, OHS, occasional tracheobronchitis.Complicated by CAD/AFib, morbid obesity Has had flu vaccine. She fought off a cold last month. Feels that control of her sleep apnea is good as she continues CPAP 16 with 2 L of oxygen/[facility]. She has noticed her weight is dropping, but she says she is not trying. She does not feel there is something wrong. Frequent nasal drip. No cough or wheeze. CXR 07/13/11 IMPRESSION:  1. Cardiomegaly.  2. Bronchitic changes.  Original Report Authenticated By: Glenice Bow, M.D.   01/11/12-  80 yo former smoker followed for OSA, OHS, occasional tracheobronchitis.Complicated by CAD/AFib, morbid obesity Has nights up able to sleep until 2-3am; CPAP every night for approx 8-9 hours Feels as though Zyrtec and singulair not helping as well right now. Having chest tightness last night and having uneasy "electric feelings" in chest.Cough-brown in color. Good compliance and control with CPAP 16/O2 2 L. Her sleep is occasionally delayed until 2 or 3 AM but she averages 8 or 9 hours of CPAP per night. Zyrtec and Singulair have been less help this spring with her seasonal allergic rhinitis. She is doing better now as the pollen season ends. Complains of "electric twinges" across her anterior chest. Admits reflux. Taking Tessalon for cough with deep breath. We discussed  reflux, cough and substernal discomfort as possibly related. We discussed her chest x-ray report from last visit.  09/08/12- 80 yo former smoker followed for OSA, OHS, occasional tracheobronchitis.Complicated by CAD/AFib, morbid obesity Has been breathing okay. Has not used her inhaler in a long time.  Being treated for stage IV kidney disease and being referred to nephrology. She says she has gradually lost 40 pounds. Our records indicate 11 pound weight loss since 2012. Complains of "allergy"-eyes water, nose runs despite Zyrtec and Singulair. Has continued oxygen 2 L with her CPAP 16/ Advanced. We discussed potential impact of weight loss..  04/23/13-  80 yo former smoker followed for OSA, OHS, occasional tracheobronchitis.Complicated by CAD/AFib, morbid obesity CPAP 16/O2 2 L./ [facility] Weight has been stable around the 192 pounds this year. She is comfortable with her CPAP plus oxygen and is sleeping pretty well. Using a rolling walker. Recently treated for heart failure. Her PCP did CXR/ [facility],.  11/05/13-80 yo former smoker followed for OSA, OHS, occasional tracheobronchitis, COPD/respiratory failure .Complicated by CAD/AFib/ CHF, morbid obesity, GERD, DM CPAP 16/ - quit.    O2 2 L./ Advanced FOLLOWS FOR:  Not wearing CPAP since October-- Reports breathing doing well. Weight down from 215 in 2010 to 163 lbs now Hospitalized in October with pneumonia, then 3 times for congestive heart failure. Continues oxygen 2 L Using Astelin nasal spray without need for rescue inhaler or active bronchodilators. Doing nebulized budesonide once daily  Review of Systems-see HPI Constitutional:   No-   Unexpected weight loss, no-night sweats, fevers, chills, fatigue, lassitude. HEENT:   No-  headaches,  difficulty swallowing, tooth/dental problems, sore throat,       No-  sneezing, itching, ear ache, nasal congestion, post nasal drip,  CV:  No-  anginal chest pain, orthopnea, PND, swelling in lower  extremities, anasarca, dizziness, palpitations Resp: +  shortness of breath with exertion or at rest.              No-   productive cough,  No non-productive cough,  No- coughing up of blood.              No-   change in color of mucus.  No- wheezing.   Skin: no-rash GI:  +  heartburn, indigestion, abdominal pain, nausea, vomiting,  GU:  MS:  No-   joint pain or swelling.   Neuro-     nothing unusual Psych:  No- change in mood or affect. No depression or anxiety.  No memory loss.   Objective:   Physical Exam General- Alert, Oriented, Affect-appropriate, Distress- none acute, still quite obese, O2 2L/M, using a walker Skin- rash-none, lesions- none, excoriation- none Lymphadenopathy- none Head- atraumatic            Eyes- Gross vision intact, PERRLA, conjunctivae clear secretions, not injected            Ears- Hearing, canals-normal            Nose- Clear, no-Septal dev, mucus, polyps, erosion, perforation             Throat- Mallampati III-IV , mucosa clear , drainage- none, tonsils- atrophic Neck- flexible , trachea midline, no stridor , thyroid nl, carotid no bruit Chest - symmetrical excursion , unlabored           Heart/CV- RRR , no murmur , no gallop  , no rub, nl s1 s2                           - JVD- none , edema- none, stasis changes- none, varices- none           Lung- clear to P&A, wheeze- none, cough- none , dullness-none, rub- none           Chest wall-  Abd-  Br/ Gen/ Rectal- Not done, not indicated Extrem- cyanosis- none, clubbing, none, atrophy- none, strength- nl Neuro- grossly intact to observation

## 2013-11-05 NOTE — Patient Instructions (Signed)
[place]elin [place]  Script sent for flonase nasal spray  We will watch to see how you do off CPAP since you have lost so much weight  We can continue O2 2L

## 2013-11-12 ENCOUNTER — Telehealth: Payer: Self-pay | Admitting: Internal Medicine

## 2013-11-12 MED ORDER — ALBUTEROL SULFATE HFA 108 (90 BASE) MCG/ACT IN AERS: INHALATION_SPRAY | RESPIRATORY_TRACT | Status: DC

## 2013-11-12 NOTE — Telephone Encounter (Signed)
Yes okay to change and verbal order given to [place]. Nothing more needed at this time.

## 2013-11-30 ENCOUNTER — Encounter: Payer: Self-pay | Admitting: Internal Medicine

## 2013-11-30 NOTE — Assessment & Plan Note (Signed)
She has lost enough weight that she very likely does not any longer needs CPAP. We will reevaluate as needed

## 2013-11-30 NOTE — Assessment & Plan Note (Addendum)
Continues Plavix. Rhythm very regular today

## 2013-11-30 NOTE — Assessment & Plan Note (Signed)
Continues oxygen 2 L/[facility]

## 2013-11-30 NOTE — Assessment & Plan Note (Addendum)
Antihistamines if needed Refill Astelin and Flonase

## 2013-12-09 ENCOUNTER — Encounter: Payer: Self-pay | Admitting: Interventional Cardiology

## 2013-12-09 ENCOUNTER — Ambulatory Visit (INDEPENDENT_AMBULATORY_CARE_PROVIDER_SITE_OTHER): Payer: Medicare Other | Admitting: Interventional Cardiology

## 2013-12-09 VITALS — BP 132/50 | HR 57 | Ht 63.0 in | Wt 163.8 lb

## 2013-12-09 DIAGNOSIS — I701 Atherosclerosis of renal artery: Secondary | ICD-10-CM

## 2013-12-09 DIAGNOSIS — I2510 Atherosclerotic heart disease of native coronary artery without angina pectoris: Secondary | ICD-10-CM

## 2013-12-09 DIAGNOSIS — I4891 Unspecified atrial fibrillation: Secondary | ICD-10-CM

## 2013-12-09 DIAGNOSIS — R609 Edema, unspecified: Secondary | ICD-10-CM

## 2013-12-09 MED ORDER — ISOSORBIDE MONONITRATE ER 30 MG PO TB24: ORAL_TABLET | ORAL | Status: DC

## 2013-12-09 MED ORDER — METOPROLOL TARTRATE 50 MG PO TABS: ORAL_TABLET | ORAL | Status: DC

## 2013-12-09 MED ORDER — CLOPIDOGREL BISULFATE 75 MG PO TABS: 75.0000 mg | ORAL_TABLET | Freq: Every day | ORAL | Status: DC

## 2013-12-09 NOTE — Progress Notes (Signed)
Patient ID: [name], female   DOB: [birth year], 80 y.o.   MRN: [id number]  Patient ID: [name], female   DOB: 1934-02-14, 80 y.o.   MRN: [id number]    [place], [place], [place]  [address] Phone: [phone] Fax:  [phone]  Date:  12/09/2013   ID:  [name], DOB April 08, 1934, MRN [id number]  PCP:  Cari Caraway, MD      History of Present Illness: [name] is a 80 y.o. female woman with known coronary artery disease and paroxysmal atrial fibrillation. Over the past few months, she has had more chronic atrial fibrillation. She has had some shortness of breath as well. She feels some mild fluid retention. She has chronic lung disease and requires oxygen. She had a repeat catheterization after her stents a few years ago and her LAD was widely patent.  She has had several hospitalizations.  She went to rehab.   Her HR has been high when she goes to rehab.    SH ehas been     Wt Readings from Last 3 Encounters:  12/09/13 163 lb 12.8 oz (74.299 kg)  11/05/13 163 lb 9.6 oz (74.208 kg)  10/29/13 164 lb 3.2 oz (74.481 kg)     Past Medical History  Diagnosis Date  . CAD (coronary artery disease)   . CHF (congestive heart failure)   . Paroxysmal atrial fibrillation   . Bradycardia   . Hypertension   . Renal artery stenosis   . COPD (chronic obstructive pulmonary disease)   . Obesity hypoventilation syndrome   . Pruritus   . Tracheobronchitis   . Acute rhinosinusitis   . Anemia   . Hypoxemia   . Peripheral edema   . Obesity, endogenous   . Diabetes mellitus   . Acid reflux disease   . Osteoarthritis, generalized   . Hypothyroidism   . Lung nodule     lingula  . OSA (obstructive sleep apnea)     NPSG 04/03/00--AHI 28.hr  . DVT (deep venous thrombosis) 2003    left leg  . Vaginal atrophy   . Hx of colonic polyps   . Acute diastolic heart failure   . Secondary pulmonary hypertension 06/09/2013  . Blood  transfusion without reported diagnosis 2009    had internal bleeding  . Cancer 1959    ovarian cancer  . Osteoporosis     Current Outpatient Prescriptions  Medication Sig Dispense Refill  . acetaminophen (TYLENOL) 325 MG tablet Take 2 tablets (650 mg total) by mouth every 6 (six) hours as needed for mild pain or headache.      . albuterol (PROAIR HFA) 108 (90 BASE) MCG/ACT inhaler 1-2 puffs QID prn  1 Inhaler  6  . allopurinol (ZYLOPRIM) 100 MG tablet Take 1 tablet (100 mg total) by mouth daily.      Marland Kitchen azelastine (ASTELIN) 137 MCG/SPRAY nasal spray 1-2 sprays each nostril once or twice daily  90 mL  3  . budesonide (PULMICORT) 0.25 MG/2ML nebulizer solution Take 0.25 mg by nebulization daily.      . clopidogrel (PLAVIX) 75 MG tablet Take 1 tablet (75 mg total) by mouth daily.  90 tablet  3  . colchicine 0.6 MG tablet Take 0.6 mg by mouth daily. Takes as needed for gout attacks.      . diltiazem (CARDIZEM CD) 360 MG 24 hr capsule Take 1 capsule (360 mg total) by mouth daily.  90 capsule  3  .  furosemide (LASIX) 40 MG tablet Take 1 tablet (40 mg total) by mouth 2 (two) times daily. Contact cardiology office or PCP if you experienced > 3 pounds overnight or > 5 pounds in 1 week for further instructions      . ibandronate (BONIVA) 150 MG tablet Take 150 mg by mouth every 30 (thirty) days. Take in the morning with a full glass of water, on an empty stomach, and do not take anything else by mouth or lie down for the next 30 min.      . insulin aspart (NOVOLOG) 100 UNIT/ML injection Check CBG before meals. CBG 121-150 give 2 units  CBG 151-200 give 3 units CBG 201-250 give 5 units CBG 251-300 give 8 units CBG 301-350 give 11 units CBG 351-400 give 15 units If CBG > 400 CALL MD  1 vial  12  . insulin glargine (LANTUS) 100 UNIT/ML injection Inject 0.2 mLs (20 Units total) into the skin daily.  10 mL  12  . isosorbide mononitrate (IMDUR) 30 MG 24 hr tablet 1 tablet by mouth daily  90 tablet  3  .  levothyroxine (SYNTHROID, LEVOTHROID) 25 MCG tablet Take 25 mcg by mouth See admin instructions. Take 1 tablet (25 mcg) on Monday, Wednesday, Friday (take 50 mcg on Tues, Thurs, Sat, Sun)      . levothyroxine (SYNTHROID, LEVOTHROID) 50 MCG tablet Take 50 mcg by mouth See admin instructions. Take 1 tablet (50 mcg) on Tuesday, Thursday, Saturday, Sunday (take 25 mcg on Monday, Wednesday and Friday)      . metoprolol (LOPRESSOR) 50 MG tablet 1 tablet by mouth twice a day  180 tablet  3  . nitroGLYCERIN (NITROSTAT) 0.4 MG SL tablet Place 0.4 mg under the tongue every 5 (five) minutes as needed for chest pain.       Marland Kitchen oxybutynin (DITROPAN XL) 10 MG 24 hr tablet Take 10 mg by mouth daily.        No current facility-administered medications for this visit.    Allergies:    Allergies  Allergen Reactions  . Cefuroxime Axetil Shortness Of Breath, Swelling and Rash    Throat swelling  . Cephalexin Shortness Of Breath, Swelling and Rash    Throat swelling  . Clarithromycin Shortness Of Breath, Swelling and Rash    Throat swelling  . Doxycycline Shortness Of Breath, Swelling and Rash     rash on legs and feet, throat swelling  . Erythromycin Shortness Of Breath, Swelling and Rash    Throat swelling  . Penicillins Shortness Of Breath, Swelling and Rash    Throat swelling  . Tetracycline Shortness Of Breath, Swelling and Rash    Throat swelling    Social History:  The patient  reports that she quit smoking about 35 years ago. She has never used smokeless tobacco. She reports that she does not drink alcohol or use illicit drugs.   Family History:  The patient's family history includes Allergies in her mother; Arthritis in her mother and sister; Bone cancer in an other family member; Breast cancer in her maternal aunt and another family member; Clotting disorder in her brother; Diabetes in her brother, sister, and another family member; Emphysema in her mother; Heart attack in her father; Heart disease  in her mother and sister; Hypertension in her father and mother; Pneumonia in her mother.   ROS:  Please see the history of present illness.  No nausea, vomiting.  No fevers, chills.  No focal weakness.  No dysuria.  All other systems reviewed and negative.   PHYSICAL EXAM: VS:  BP 132/50  Pulse 57  Ht 5\' 3"  (1.6 m)  Wt 163 lb 12.8 oz (74.299 kg)  BMI 29.02 kg/m2  SpO2 98%  LMP 08/13/1957 Well nourished, well developed, in no acute distress HEENT: normal Neck: no JVD, no carotid bruits Cardiac:  normal S1, S2; irregularly irregular, tachycardic Lungs:  clear to auscultation bilaterally, no wheezing, rhonchi or rales Abd: soft, nontender, no hepatomegaly Ext: Trace pretibial edemaBilaterally Skin: warm and dry Neuro:   no focal abnormalities noted  EKG:  Atrial fibrillation, nonspecific ST segment changes, rapid ventricular response     ASSESSMENT AND PLAN:  Atrial fibrillation  off of Sotalol HCl Tablet, 80 MG, 1 tablet, Orally, Twice a day due to renal insufficiency ; did not tolerate amiodarone due to nausea  IMAGING: EKG    Overton,Shana 04/22/2013 03:54:33 PM > Annais Crafts,JAY 04/22/2013 04:15:14 PM > NSR, no significant ST segment changes   Notes: Increased metoprolol to 50 mg by mouth twice a day for additional rate control. Continue diltiazem 360 mg daily.  In NSR today.  THis is a change from prior visit.  Could consider dofetelide  but she does not want to go in the hospital.  Flecainide not an option due to prior CAD.       2. Hypertension, essential   Notes: prn CLonidine has helped BP in addition to other scheduled meds. Renal artery stenosis in the past.  She has not had to use this in a while.  Her BP is well controlled today.     3. Edema - much improved.   Notes: likely related to right heart failure from lung disease; diastolic heart failure as well.   can increase Lasix to 40 mg by mouth twice a day with a third dose when necessary.     4. Coronary  atherosclerosis of native coronary artery  Notes: No angina. Nonsignificant RCA disease by FFR in 2012. COuld hold plavix for 5 days if back surgery needed since stents are from 2008.    Preventive Medicine  Adult topics discussed:  Diet: healthy diet.      Signed, Mina Marble, MD, [facility] 12/09/2013 2:43 PM

## 2013-12-09 NOTE — Patient Instructions (Signed)
Your physician recommends that you continue on your current medications as directed. Please refer to the Current Medication list given to you today.  Your physician wants you to follow-up in: 6 months with Dr. Varanasi.  You will receive a reminder letter in the mail two months in advance. If you don't receive a letter, please call our office to schedule the follow-up appointment.  

## 2014-01-01 ENCOUNTER — Other Ambulatory Visit: Payer: Self-pay | Admitting: Interventional Cardiology

## 2014-01-01 ENCOUNTER — Other Ambulatory Visit: Payer: Self-pay | Admitting: *Deleted

## 2014-01-01 MED ORDER — DILTIAZEM HCL ER COATED BEADS 360 MG PO CP24: 360.0000 mg | ORAL_CAPSULE | Freq: Every day | ORAL | Status: DC

## 2014-01-14 ENCOUNTER — Telehealth: Payer: Self-pay | Admitting: Interventional Cardiology

## 2014-01-14 NOTE — Telephone Encounter (Signed)
New message          Pt would like to know if it was you that called her yesterday.

## 2014-01-14 NOTE — Telephone Encounter (Signed)
Called pt to let her know I did not call her yesterday.

## 2014-02-02 ENCOUNTER — Encounter: Payer: Self-pay | Admitting: Pulmonary Disease

## 2014-02-02 ENCOUNTER — Ambulatory Visit (INDEPENDENT_AMBULATORY_CARE_PROVIDER_SITE_OTHER): Payer: Medicare Other | Admitting: Pulmonary Disease

## 2014-02-02 ENCOUNTER — Telehealth: Payer: Self-pay | Admitting: Internal Medicine

## 2014-02-02 ENCOUNTER — Ambulatory Visit (INDEPENDENT_AMBULATORY_CARE_PROVIDER_SITE_OTHER)
Admission: RE | Admit: 2014-02-02 | Discharge: 2014-02-02 | Disposition: A | Discharge status: Home / Self Care | Payer: Medicare Other | Source: Ambulatory Visit | Attending: Pulmonary Disease | Admitting: Pulmonary Disease

## 2014-02-02 VITALS — BP 130/80 | HR 94 | Temp 98.7°F | Ht 61.0 in | Wt 176.6 lb

## 2014-02-02 DIAGNOSIS — J40 Bronchitis, not specified as acute or chronic: Secondary | ICD-10-CM

## 2014-02-02 DIAGNOSIS — I2510 Atherosclerotic heart disease of native coronary artery without angina pectoris: Secondary | ICD-10-CM

## 2014-02-02 MED ORDER — LEVOFLOXACIN 750 MG PO TABS: 750.0000 mg | ORAL_TABLET | Freq: Every day | ORAL | Status: DC

## 2014-02-02 NOTE — Telephone Encounter (Signed)
Per CY-lets have patient come in to see [facility] this afternoon since she is a COPD Gold card. Thanks.

## 2014-02-02 NOTE — Telephone Encounter (Signed)
Called spoke with pt. C/o fever, chills, prod cough green-brown phlem. Had a friend Investment banker, corporate) listen to her and reports she had fluid in R lung. Pt wants to be seen today by CDY. NO available openings in office with anyone today. Please advise thanks  Allergies  Allergen Reactions  . Cefuroxime Axetil Shortness Of Breath, Swelling and Rash    Throat swelling  . Cephalexin Shortness Of Breath, Swelling and Rash    Throat swelling  . Clarithromycin Shortness Of Breath, Swelling and Rash    Throat swelling  . Doxycycline Shortness Of Breath, Swelling and Rash     rash on legs and feet, throat swelling  . Erythromycin Shortness Of Breath, Swelling and Rash    Throat swelling  . Penicillins Shortness Of Breath, Swelling and Rash    Throat swelling  . Tetracycline Shortness Of Breath, Swelling and Rash    Throat swelling     Current Outpatient Prescriptions on File Prior to Visit  Medication Sig Dispense Refill  . acetaminophen (TYLENOL) 325 MG tablet Take 2 tablets (650 mg total) by mouth every 6 (six) hours as needed for mild pain or headache.      . albuterol (PROAIR HFA) 108 (90 BASE) MCG/ACT inhaler 1-2 puffs QID prn  1 Inhaler  6  . allopurinol (ZYLOPRIM) 100 MG tablet Take 1 tablet (100 mg total) by mouth daily.      Marland Kitchen azelastine (ASTELIN) 137 MCG/SPRAY nasal spray 1-2 sprays each nostril once or twice daily  90 mL  3  . budesonide (PULMICORT) 0.25 MG/2ML nebulizer solution Take 0.25 mg by nebulization daily.      . clopidogrel (PLAVIX) 75 MG tablet Take 1 tablet (75 mg total) by mouth daily.  90 tablet  3  . colchicine 0.6 MG tablet Take 0.6 mg by mouth daily. Takes as needed for gout attacks.      . diltiazem (CARDIZEM CD) 360 MG 24 hr capsule Take 1 capsule (360 mg total) by mouth daily.  90 capsule  1  . furosemide (LASIX) 40 MG tablet Take 1 tablet (40 mg total) by mouth 2 (two) times daily. Contact cardiology office or PCP if you experienced > 3 pounds overnight or > 5 pounds in 1  week for further instructions      . ibandronate (BONIVA) 150 MG tablet Take 150 mg by mouth every 30 (thirty) days. Take in the morning with a full glass of water, on an empty stomach, and do not take anything else by mouth or lie down for the next 30 min.      . insulin aspart (NOVOLOG) 100 UNIT/ML injection Check CBG before meals. CBG 121-150 give 2 units  CBG 151-200 give 3 units CBG 201-250 give 5 units CBG 251-300 give 8 units CBG 301-350 give 11 units CBG 351-400 give 15 units If CBG > 400 CALL MD  1 vial  12  . insulin glargine (LANTUS) 100 UNIT/ML injection Inject 0.2 mLs (20 Units total) into the skin daily.  10 mL  12  . isosorbide mononitrate (IMDUR) 30 MG 24 hr tablet 1 tablet by mouth daily  90 tablet  3  . levothyroxine (SYNTHROID, LEVOTHROID) 25 MCG tablet Take 25 mcg by mouth See admin instructions. Take 1 tablet (25 mcg) on Monday, Wednesday, Friday (take 50 mcg on Tues, Thurs, Sat, Sun)      . levothyroxine (SYNTHROID, LEVOTHROID) 50 MCG tablet Take 50 mcg by mouth See admin instructions. Take 1 tablet (50 mcg) on Tuesday,  Thursday, Saturday, Sunday (take 25 mcg on Monday, Wednesday and Friday)      . metoprolol (LOPRESSOR) 50 MG tablet 1 tablet by mouth twice a day  180 tablet  3  . nitroGLYCERIN (NITROSTAT) 0.4 MG SL tablet Place 0.4 mg under the tongue every 5 (five) minutes as needed for chest pain.       Marland Kitchen oxybutynin (DITROPAN XL) 10 MG 24 hr tablet Take 10 mg by mouth daily.        No current facility-administered medications on file prior to visit.

## 2014-02-02 NOTE — Assessment & Plan Note (Signed)
The patient is having increased chest congestion and cough with purulent mucus over the last 2 days, and also notes subjective fevers. She does have some mild crackles in the right base, but tells me that she keeps crackles because of chronic congestive heart failure. Will treat her with a course of antibiotics for possible bronchitis, but will also check a chest x-ray to make sure she does not have a pneumonic infiltrate. She is already taking Mucinex for pulmonary:, And I've asked her to continue on this. She is to let us know if she is not returning to baseline.

## 2014-02-02 NOTE — Patient Instructions (Signed)
Will check chest xray today to see if you have pneumonia.  Will call you with results. Take levaquin 750mg  one a day for 7 days Keep your followup with Dr. Annamaria Boots, but let us know if you are not improving.

## 2014-02-02 NOTE — Telephone Encounter (Signed)
Pt is COPD gold & will need an appt today.  Satira Anis

## 2014-02-02 NOTE — Progress Notes (Signed)
   Subjective:    Patient ID: [name], female    DOB: 1933/10/04, 80 y.o.   MRN: [id number]  HPI Patient comes in today for an acute sick visit. She is normally followed by Dr. Annamaria Boots for chronic respiratory failure, as well as obesity hypoventilation syndrome. It is unclear if she has underlying obstructive lung disease. She gives a 2 day history of increasing chest congestion, cough with purulent mucus, and some increased shortness of breath. She has had subjective fevers, and a neighbor who is a nurse has told her that her lungs sound abnormal.   Review of Systems  Constitutional: Negative for fever and unexpected weight change.  HENT: Positive for ear pain and postnasal drip. Negative for congestion, dental problem, nosebleeds, rhinorrhea, sinus pressure, sneezing, sore throat and trouble swallowing.   Eyes: Negative for redness and itching.  Respiratory: Positive for cough, chest tightness, shortness of breath and wheezing.   Cardiovascular: Positive for palpitations. Negative for leg swelling.  Gastrointestinal: Negative for nausea and vomiting.  Genitourinary: Negative for dysuria.  Musculoskeletal: Positive for joint swelling.  Skin: Negative for rash.  Neurological: Negative for headaches.  Hematological: Bruises/bleeds easily.  Psychiatric/Behavioral: Negative for dysphoric mood. The patient is not nervous/anxious.        Objective:   Physical Exam Obese female in no acute distress Nose without purulence or discharge noted Neck without lymphadenopathy or thyromegaly Chest with basilar crackles right greater than left, an occasional squeak on inspiration Cardiac exam with regular rate and rhythm, 2/6 systolic murmur Lower extremities with mild edema, varicosities present Alert and oriented, moves all 4 extremities.       Assessment & Plan:

## 2014-02-02 NOTE — Telephone Encounter (Signed)
Called pt. She is scheduled to come in at 2 pm this afternoon. Nothing further needed

## 2014-03-11 ENCOUNTER — Telehealth: Payer: Self-pay | Admitting: Interventional Cardiology

## 2014-03-11 NOTE — Telephone Encounter (Signed)
Received request from Nurse fax box, documents faxed for surgical clearance. To: [employer] Fax number: [phone] Attention: [phone]/km

## 2014-03-15 ENCOUNTER — Telehealth: Payer: Self-pay | Admitting: Internal Medicine

## 2014-03-15 DIAGNOSIS — J40 Bronchitis, not specified as acute or chronic: Secondary | ICD-10-CM

## 2014-03-15 DIAGNOSIS — J449 Chronic obstructive pulmonary disease, unspecified: Secondary | ICD-10-CM

## 2014-03-15 NOTE — Telephone Encounter (Signed)
Per 11/05/13; Acute and chronic respiratory failure with hypoxia - Deneise Lever, MD at 11/30/2013 8:52 PM     Status: Written Related Problem: Acute and chronic respiratory failure with hypoxia    Continues oxygen 2 L/[facility]  --  Called spoke with pt. She reports [facility] needs [name] to cal them to let them know it is okay for her to still be on 2l/m O2. I called melissa. She will check into this and see if anything is needed

## 2014-03-16 NOTE — Telephone Encounter (Signed)
Sent staff message to [facility] at [facility] to check on status of order.

## 2014-03-16 NOTE — Telephone Encounter (Signed)
Per Corene Cornea w/ [facility]; From what I understand the pt called in to our office wanting smaller tanks. We will need an rx for a port eval in order for [name] to do this. Thanks!  ---   Please advise Dr. Annamaria Boots if okay to order? Thanks

## 2014-03-16 NOTE — Telephone Encounter (Signed)
Ok to order DME [facility]- eval for smaller portable O2 system. Continue current liter flow.

## 2014-03-17 ENCOUNTER — Telehealth: Payer: Self-pay | Admitting: Interventional Cardiology

## 2014-03-17 NOTE — Telephone Encounter (Signed)
[name], can you refax surgical clearance?

## 2014-03-17 NOTE — Telephone Encounter (Signed)
Order placed. Nothing further needed. 

## 2014-03-17 NOTE — Telephone Encounter (Signed)
New message      [facility] never received clearance to stop/hold medication prior to surgery.  Please refax it to [phone]

## 2014-03-17 NOTE — Telephone Encounter (Signed)
[facility] surgical Clearance re faxed [phone]/km

## 2014-03-18 ENCOUNTER — Telehealth: Payer: Self-pay | Admitting: Interventional Cardiology

## 2014-03-18 NOTE — Telephone Encounter (Signed)
Lm with [name] letting her know clearance was re-faxed yesterday and to call back if fax was not received.

## 2014-03-18 NOTE — Telephone Encounter (Signed)
New message    Checking on the status of cardiac clearance

## 2014-05-13 ENCOUNTER — Ambulatory Visit: Payer: Medicare Other | Admitting: Internal Medicine

## 2014-05-13 ENCOUNTER — Encounter: Payer: Self-pay | Admitting: Internal Medicine

## 2014-05-13 VITALS — BP 126/62 | HR 54 | Ht 63.0 in | Wt 182.4 lb

## 2014-05-13 DIAGNOSIS — Z23 Encounter for immunization: Secondary | ICD-10-CM

## 2014-05-13 MED ORDER — BUDESONIDE 0.25 MG/2ML IN SUSP: 0.2500 mg | Freq: Every day | RESPIRATORY_TRACT | Status: AC

## 2014-05-13 MED ORDER — AMOXICILLIN 500 MG PO CAPS: ORAL_CAPSULE | ORAL | Status: DC

## 2014-05-13 NOTE — Progress Notes (Signed)
Patient ID: [name], female    DOB: [birth year], 80 y.o.   MRN: [id number]  HPI 01/11/11- 25 yo former smoker followed for OSA, OHS, occasional tracheobronchitis.Complicated by CAD/AFib, morbid obesity Last here Jun 15, 2010  - note reviewed. Denies significant changes. O2 dependent due to OHS. Complains of allergy with watering eyes and nose. Right ear hurts. Cardiologist added lisinopril for BP- discussed ACEI side effects.  Continues to use CPAP 16 every night with O2 2 L/M  Going to PT rehab to strengthen to reduce fall risk. Heat bothering her.  07/13/11-  13 yo former smoker followed for OSA, OHS, occasional tracheobronchitis.Complicated by CAD/AFib, morbid obesity Has had flu vaccine. She fought off a cold last month. Feels that control of her sleep apnea is good as she continues CPAP 16 with 2 L of oxygen/[facility]. She has noticed her weight is dropping, but she says she is not trying. She does not feel there is something wrong. Frequent nasal drip. No cough or wheeze. CXR 07/13/11 IMPRESSION:  1. Cardiomegaly.  2. Bronchitic changes.  Original Report Authenticated By: Glenice Bow, M.D.   01/11/12-  73 yo former smoker followed for OSA, OHS, occasional tracheobronchitis.Complicated by CAD/AFib, morbid obesity Has nights up able to sleep until 2-3am; CPAP every night for approx 8-9 hours Feels as though Zyrtec and singulair not helping as well right now. Having chest tightness last night and having uneasy "electric feelings" in chest.Cough-brown in color. Good compliance and control with CPAP 16/O2 2 L. Her sleep is occasionally delayed until 2 or 3 AM but she averages 8 or 9 hours of CPAP per night. Zyrtec and Singulair have been less help this spring with her seasonal allergic rhinitis. She is doing better now as the pollen season ends. Complains of "electric twinges" across her anterior chest. Admits reflux. Taking Tessalon for cough with deep breath. We discussed  reflux, cough and substernal discomfort as possibly related. We discussed her chest x-ray report from last visit.  09/08/12- 34 yo former smoker followed for OSA, OHS, occasional tracheobronchitis.Complicated by CAD/AFib, morbid obesity Has been breathing okay. Has not used her inhaler in a long time.  Being treated for stage IV kidney disease and being referred to nephrology. She says she has gradually lost 40 pounds. Our records indicate 11 pound weight loss since 2012. Complains of "allergy"-eyes water, nose runs despite Zyrtec and Singulair. Has continued oxygen 2 L with her CPAP 16/ [facility]. We discussed potential impact of weight loss..  04/23/13-  12 yo former smoker followed for OSA, OHS, occasional tracheobronchitis.Complicated by CAD/AFib, morbid obesity CPAP 16/O2 2 L./ [facility] Weight has been stable around the 192 pounds this year. She is comfortable with her CPAP plus oxygen and is sleeping pretty well. Using a rolling walker. Recently treated for heart failure. Her PCP did CXR/ [facility],.  11/05/13-79 yo former smoker followed for OSA, OHS, occasional tracheobronchitis, COPD/respiratory failure .Complicated by CAD/AFib/ CHF, morbid obesity, GERD, DM CPAP 16/ - quit.    O2 2 L./ [facility] FOLLOWS FOR:  Not wearing CPAP since October-- Reports breathing doing well. Weight down from 215 in 2010 to 163 lbs now Hospitalized in October with pneumonia, then 3 times for congestive heart failure. Continues oxygen 2 L Using Astelin nasal spray without need for rescue inhaler or active bronchodilators. Doing nebulized budesonide once daily  Acute- Dr Gwenette Greet HPI Patient comes in today for an acute sick visit. She is normally followed by Dr. Annamaria Boots for chronic respiratory failure, as  well as obesity hypoventilation syndrome. It is unclear if she has underlying obstructive lung disease. She gives a 2 day history of increasing chest congestion, cough with purulent mucus, and some increased shortness of  breath. She has had subjective fevers, and a neighbor who is a nurse has told her that her lungs sound abnormal.   05/13/14- 89 yo former smoker followed for OSA, OHS, occasional tracheobronchitis, COPD/respiratory failure .Complicated by CAD/AFib/ CHF, morbid obesity, GERD, DM CPAP 16/ - quit.    O2 2 L./ [facility] FOLLOWS FOR:Has trouble sleeping at times,sob occass.,cough-yellow and white,wheezing,denies cp or tightness,pnd,stuffy nose in am,not using CPAP      Review of Systems-see HPI Constitutional:   No-   Unexpected weight loss, no-night sweats, fevers, chills, fatigue, lassitude. HEENT:   No-  headaches, difficulty swallowing, tooth/dental problems, sore throat,       No-  sneezing, itching, ear ache, nasal congestion, post nasal drip,  CV:  No-  anginal chest pain, orthopnea, PND, swelling in lower extremities, anasarca, dizziness, palpitations Resp: +  shortness of breath with exertion or at rest.              No-   productive cough,  No non-productive cough,  No- coughing up of blood.              No-   change in color of mucus.  No- wheezing.   Skin: no-rash GI:  +  heartburn, indigestion, abdominal pain, nausea, vomiting,  GU:  MS:  No-   joint pain or swelling.   Neuro-     nothing unusual Psych:  No- change in mood or affect. No depression or anxiety.  No memory loss.   Objective:   Physical Exam General- Alert, Oriented, Affect-appropriate, Distress- none acute, still quite obese, O2 2L/M, using a walker Skin- rash-none, lesions- none, excoriation- none Lymphadenopathy- none Head- atraumatic            Eyes- Gross vision intact, PERRLA, conjunctivae clear secretions, not injected            Ears- Hearing, canals-normal            Nose- Clear, no-Septal dev, mucus, polyps, erosion, perforation             Throat- Mallampati III-IV , mucosa clear , drainage- none, tonsils- atrophic Neck- flexible , trachea midline, no stridor , thyroid nl, carotid no bruit Chest -  symmetrical excursion , unlabored           Heart/CV- RRR , no murmur , no gallop  , no rub, nl s1 s2                           - JVD- none , edema- none, stasis changes- none, varices- none           Lung- clear to P&A, wheeze- none, cough- none , dullness-none, rub- none           Chest wall-  Abd-  Br/ Gen/ Rectal- Not done, not indicated Extrem- cyanosis- none, clubbing, none, atrophy- none, strength- nl Neuro- grossly intact to observation

## 2014-05-13 NOTE — Patient Instructions (Signed)
Flu vax  We can give you the Prevnar vaccine next time you come in  Refill script for Pulmicort neb solution  Script printed for amoxacillin to take if you get a real chest infection

## 2014-05-28 ENCOUNTER — Other Ambulatory Visit: Payer: Self-pay

## 2014-06-03 ENCOUNTER — Telehealth: Payer: Self-pay | Admitting: Internal Medicine

## 2014-06-03 NOTE — Telephone Encounter (Signed)
Paper and message on CY's cart.

## 2014-06-04 NOTE — Telephone Encounter (Signed)
Done

## 2014-06-07 NOTE — Telephone Encounter (Signed)
Terri Jersey do you know if we can sign off of this message?  Has this been sent back to the pt?

## 2014-06-08 NOTE — Telephone Encounter (Signed)
Nope but with different help each day, consistency is hard. I would have either put it on desk or in wood box on wall.

## 2014-06-08 NOTE — Telephone Encounter (Signed)
I have not seen this paper work.... CY do you recall who you gave the paper to? Thanks.

## 2014-06-09 ENCOUNTER — Encounter: Payer: Self-pay | Admitting: Interventional Cardiology

## 2014-06-09 ENCOUNTER — Ambulatory Visit (INDEPENDENT_AMBULATORY_CARE_PROVIDER_SITE_OTHER): Payer: Medicare Other | Admitting: Interventional Cardiology

## 2014-06-09 VITALS — BP 158/90 | HR 57 | Ht 60.0 in | Wt 173.0 lb

## 2014-06-09 DIAGNOSIS — I2510 Atherosclerotic heart disease of native coronary artery without angina pectoris: Secondary | ICD-10-CM

## 2014-06-09 DIAGNOSIS — I701 Atherosclerosis of renal artery: Secondary | ICD-10-CM

## 2014-06-09 DIAGNOSIS — I5032 Chronic diastolic (congestive) heart failure: Secondary | ICD-10-CM | POA: Insufficient documentation

## 2014-06-09 DIAGNOSIS — I480 Paroxysmal atrial fibrillation: Secondary | ICD-10-CM

## 2014-06-09 NOTE — Progress Notes (Signed)
Patient ID: ORIE CUTTINO, female   DOB: 10/11/1933, 80 y.o.   MRN: [id number] Patient ID: LOUELLEN HALDEMAN, female   DOB: 1934/01/09, 80 y.o.   MRN: [id number]  Patient ID: ZAURIA DOMBEK, female   DOB: 06-Dec-1933, 80 y.o.   MRN: [id number]    [address], [place]  [address] Phone: [phone] Fax:  [phone]  Date:  06/09/2014   ID:  ANAISE STERBENZ, DOB 1933/09/07, MRN [id number]  PCP:  Cari Caraway, MD      History of Present Illness: [name] is a 80 y.o. female woman with known coronary artery disease and paroxysmal atrial fibrillation. Over the past few months, she has done well without hospitalization. She has had some shortness of breath as well. She feels some mild fluid retention. She has chronic lung disease and requires oxygen. She had a repeat catheterization in Jan 2012 after her stents a few years ago and her LAD was widely patent.  She has had several hospitalizations.  She went to rehab.   Her HR has been high when she goes to rehab.    SHe has been having joint pains.    Wt Readings from Last 3 Encounters:  06/09/14 173 lb (78.472 kg)  05/13/14 182 lb 6.4 oz (82.736 kg)  02/02/14 176 lb 9.6 oz (80.105 kg)     Past Medical History  Diagnosis Date  . CAD (coronary artery disease)   . CHF (congestive heart failure)   . Paroxysmal atrial fibrillation   . Bradycardia   . Hypertension   . Renal artery stenosis   . COPD (chronic obstructive pulmonary disease)   . Obesity hypoventilation syndrome   . Pruritus   . Tracheobronchitis   . Acute rhinosinusitis   . Anemia   . Hypoxemia   . Peripheral edema   . Obesity, endogenous   . Diabetes mellitus   . Acid reflux disease   . Osteoarthritis, generalized   . Hypothyroidism   . Lung nodule     lingula  . OSA (obstructive sleep apnea)     NPSG 04/03/00--AHI 28.hr  . DVT (deep venous thrombosis) 2003    left leg  . Vaginal atrophy   . Hx of  colonic polyps   . Acute diastolic heart failure   . Secondary pulmonary hypertension 06/09/2013  . Blood transfusion without reported diagnosis 2009    had internal bleeding  . Cancer 1959    ovarian cancer  . Osteoporosis     Current Outpatient Prescriptions  Medication Sig Dispense Refill  . acetaminophen (TYLENOL) 325 MG tablet Take 2 tablets (650 mg total) by mouth every 6 (six) hours as needed for mild pain or headache.      . albuterol (PROAIR HFA) 108 (90 BASE) MCG/ACT inhaler 1-2 puffs QID prn  1 Inhaler  6  . allopurinol (ZYLOPRIM) 100 MG tablet Take 1 tablet (100 mg total) by mouth daily.      Marland Kitchen amoxicillin (AMOXIL) 500 MG capsule 1 cap three times daily for infection  21 capsule  0  . azelastine (ASTELIN) 137 MCG/SPRAY nasal spray 1-2 sprays each nostril once or twice daily  90 mL  3  . budesonide (PULMICORT) 0.25 MG/2ML nebulizer solution Take 2 mLs (0.25 mg total) by nebulization daily.  60 mL  prn  . Calcium Carbonate-Vitamin D (CALCIUM-VITAMIN D) 500-200 MG-UNIT per tablet Take 1 tablet by mouth daily.      . clopidogrel (PLAVIX) 75 MG tablet  Take 1 tablet (75 mg total) by mouth daily.  90 tablet  3  . colchicine 0.6 MG tablet Take 0.6 mg by mouth daily. Takes as needed for gout attacks.      . diltiazem (CARDIZEM CD) 360 MG 24 hr capsule Take 1 capsule (360 mg total) by mouth daily.  90 capsule  1  . furosemide (LASIX) 40 MG tablet Take 1 tablet (40 mg total) by mouth 2 (two) times daily. Contact cardiology office or PCP if you experienced > 3 pounds overnight or > 5 pounds in 1 week for further instructions      . ibandronate (BONIVA) 150 MG tablet Take 150 mg by mouth every 30 (thirty) days. Take in the morning with a full glass of water, on an empty stomach, and do not take anything else by mouth or lie down for the next 30 min.      . insulin aspart (NOVOLOG) 100 UNIT/ML injection Check CBG before meals. CBG 121-150 give 2 units  CBG 151-200 give 3 units CBG 201-250  give 5 units CBG 251-300 give 8 units CBG 301-350 give 11 units CBG 351-400 give 15 units If CBG > 400 CALL MD  1 vial  12  . insulin glargine (LANTUS) 100 UNIT/ML injection Inject 0.2 mLs (20 Units total) into the skin daily.  10 mL  12  . isosorbide mononitrate (IMDUR) 30 MG 24 hr tablet 1 tablet by mouth daily  90 tablet  3  . levothyroxine (SYNTHROID, LEVOTHROID) 25 MCG tablet Take 25 mcg by mouth See admin instructions. Take 1 tablet (25 mcg) on Monday, Wednesday, Friday (take 50 mcg on Tues, Thurs, Sat, Sun)      . levothyroxine (SYNTHROID, LEVOTHROID) 50 MCG tablet Take 50 mcg by mouth See admin instructions. Take 1 tablet (50 mcg) on Tuesday, Thursday, Saturday, Sunday (take 25 mcg on Monday, Wednesday and Friday)      . metoprolol (LOPRESSOR) 50 MG tablet 1 tablet by mouth twice a day  180 tablet  3  . nitroGLYCERIN (NITROSTAT) 0.4 MG SL tablet Place 0.4 mg under the tongue every 5 (five) minutes as needed for chest pain.       Marland Kitchen oxybutynin (DITROPAN XL) 10 MG 24 hr tablet Take 10 mg by mouth daily.       . Prenatal Vit-Fe Fumarate-FA (MULTIVITAMIN-PRENATAL) 27-0.8 MG TABS tablet Take 1 tablet by mouth daily at 12 noon.      . traMADol (ULTRAM) 50 MG tablet Take 50 mg by mouth every 6 (six) hours as needed.       No current facility-administered medications for this visit.    Allergies:    Allergies  Allergen Reactions  . Cefuroxime Axetil Shortness Of Breath, Swelling and Rash    Throat swelling  . Cephalexin Shortness Of Breath, Swelling and Rash    Throat swelling  . Clarithromycin Shortness Of Breath, Swelling and Rash    Throat swelling  . Doxycycline Shortness Of Breath, Swelling and Rash     rash on legs and feet, throat swelling  . Erythromycin Shortness Of Breath, Swelling and Rash    Throat swelling  . Tetracycline Shortness Of Breath, Swelling and Rash    Throat swelling    Social History:  The patient  reports that she quit smoking about 35 years ago. She has  never used smokeless tobacco. She reports that she does not drink alcohol or use illicit drugs.   Family History:  The patient's family history includes Allergies  in her mother; Arthritis in her mother and sister; Bone cancer in an other family member; Breast cancer in her maternal aunt and another family member; Clotting disorder in her brother; Diabetes in her brother, sister, and another family member; Emphysema in her mother; Heart attack in her father; Heart disease in her mother and sister; Hypertension in her father and mother; Pneumonia in her mother.   ROS:  Please see the history of present illness.  No nausea, vomiting.  No fevers, chills.  No focal weakness.  No dysuria.    All other systems reviewed and negative.   PHYSICAL EXAM: VS:  BP 158/90  Pulse 57  Ht 5' (1.524 m)  Wt 173 lb (78.472 kg)  BMI 33.79 kg/m2  LMP 08/13/1957 Well nourished, well developed, in no acute distress HEENT: normal Neck: no JVD, no carotid bruits Cardiac:  normal S1, S2; RRR, tachycardic Lungs:  clear to auscultation bilaterally, no wheezing, rhonchi or rales Abd: soft, nontender, no hepatomegaly Ext: Trace pretibial edemaBilaterally Skin: warm and dry Neuro:   no focal abnormalities noted  EKG:  Sinus bradycardia    ASSESSMENT AND PLAN:  Atrial fibrillation  off of Sotalol HCl Tablet, 80 MG, 1 tablet, Orally, Twice a day due to renal insufficiency ; did not tolerate amiodarone due to nausea  IMAGING: EKG    Overton,Shana 04/22/2013 03:54:33 PM > Jaryn Rosko,JAY 04/22/2013 04:15:14 PM > NSR, no significant ST segment changes   Notes: Increased metoprolol to 50 mg by mouth twice a day for additional rate control. Continue diltiazem 360 mg daily.  In NSR today.  Could consider dofetelide  but she does not want to go in the hospital.  Flecainide not an option due to prior CAD.       2. Hypertension, essential   Notes: prn CLonidine has helped BP in addition to other scheduled meds. Renal artery  stenosis in the past.  She has not had to use this in a while.  Her BP is well controlled at home typically.    3. Edema - much improved.   Notes: likely related to right heart failure from lung disease; diastolic heart failure as well.   can increase Lasix to 40 mg by mouth twice a day with a third dose when necessary.     4. Coronary atherosclerosis of native coronary artery  Notes: No angina. Nonsignificant RCA disease by FFR in 2012. COuld hold plavix for 5 days if back surgery needed since stents are from 2008.    Preventive Medicine  Adult topics discussed:  Diet: healthy diet.      Signed, Mina Marble, MD, [facility] 06/09/2014 3:42 PM

## 2014-06-09 NOTE — Patient Instructions (Signed)
Your physician wants you to follow-up in: 9 months with Dr. Irish Lack. You will receive a reminder letter in the mail two months in advance. If you don't receive a letter, please call our office to schedule the follow-up appointment.  Your physician recommends that you continue on your current medications as directed. Please refer to the Current Medication list given to you today.

## 2014-06-14 ENCOUNTER — Encounter: Payer: Self-pay | Admitting: Interventional Cardiology

## 2014-06-17 NOTE — Telephone Encounter (Signed)
Katie, please advise if this has been taken care of.  Thank you.

## 2014-06-17 NOTE — Telephone Encounter (Signed)
To my knowledge this was taken care of by the nurse working with CY that day.

## 2014-06-23 ENCOUNTER — Telehealth: Payer: Self-pay | Admitting: Interventional Cardiology

## 2014-06-23 NOTE — Telephone Encounter (Signed)
Ok to hold plavix 5 days prior.  Amoxicillin ok from a cardiac perspective.

## 2014-06-23 NOTE — Telephone Encounter (Signed)
Pt states she went to dentist Monday for infected tooth.  The dentist wants to prescribe ampicillin for her. Pt is asking if this is OK with Dr Irish Lack. Pt states dentist is going to have to extract infected tooth and is asking if pt can hold Plavix for 5 days prior to extraction.   Pt aware I will forward to Dr Irish Lack for review.

## 2014-06-23 NOTE — Telephone Encounter (Signed)
New message           Pt has an abscess on her tooth and wants to know which antibiotic she should take and also pt needs to be off of plavix for five days / is this ok

## 2014-06-24 NOTE — Telephone Encounter (Signed)
Pt called 11-12 and was given message, pt aware and has no questions

## 2014-08-11 ENCOUNTER — Telehealth: Payer: Self-pay | Admitting: Internal Medicine

## 2014-08-11 MED ORDER — AMOXICILLIN-POT CLAVULANATE 875-125 MG PO TABS: 1.0000 | ORAL_TABLET | Freq: Two times a day (BID) | ORAL | Status: DC

## 2014-08-11 NOTE — Telephone Encounter (Signed)
Called and spoke with pt and she is aware of meds that have been called to the pharmacy for her.  She will continue the mucinex as well.  Nothing further is needed.

## 2014-08-11 NOTE — Telephone Encounter (Signed)
Called and spoke to [name]. [name] c/o increase in SOB, chest congestion, prod cough with thick yellow, green and brown mucus, chest tightness, chest pain when coughing and wheezing at night x 2 days. [name] stated she is taking mucinex BID. [name] last seen on 05/13/2014 by CY.   Dr. Annamaria Boots please advise.   Allergies  Allergen Reactions  . Cefuroxime Axetil Shortness Of Breath, Swelling and Rash    Throat swelling  . Cephalexin Shortness Of Breath, Swelling and Rash    Throat swelling  . Clarithromycin Shortness Of Breath, Swelling and Rash    Throat swelling  . Doxycycline Shortness Of Breath, Swelling and Rash     rash on legs and feet, throat swelling  . Erythromycin Shortness Of Breath, Swelling and Rash    Throat swelling  . Tetracycline Shortness Of Breath, Swelling and Rash    Throat swelling     Current Outpatient Prescriptions on File Prior to Visit  Medication Sig Dispense Refill  . acetaminophen (TYLENOL) 325 MG tablet Take 2 tablets (650 mg total) by mouth every 6 (six) hours as needed for mild pain or headache.    . albuterol (PROAIR HFA) 108 (90 BASE) MCG/ACT inhaler 1-2 puffs QID prn 1 Inhaler 6  . allopurinol (ZYLOPRIM) 100 MG tablet Take 1 tablet (100 mg total) by mouth daily.    Marland Kitchen azelastine (ASTELIN) 137 MCG/SPRAY nasal spray 1-2 sprays each nostril once or twice daily 90 mL 3  . budesonide (PULMICORT) 0.25 MG/2ML nebulizer solution Take 2 mLs (0.25 mg total) by nebulization daily. 60 mL prn  . Calcium Carbonate-Vitamin D (CALCIUM-VITAMIN D) 500-200 MG-UNIT per tablet Take 1 tablet by mouth daily.    . clopidogrel (PLAVIX) 75 MG tablet Take 1 tablet (75 mg total) by mouth daily. 90 tablet 3  . colchicine 0.6 MG tablet Take 0.6 mg by mouth daily. Takes as needed for gout attacks.    . diltiazem (CARDIZEM CD) 360 MG 24 hr capsule Take 1 capsule (360 mg total) by mouth daily. 90 capsule 1  . furosemide (LASIX) 40 MG tablet Take 1 tablet (40 mg total) by mouth 2 (two) times daily.  Contact cardiology office or PCP if you experienced > 3 pounds overnight or > 5 pounds in 1 week for further instructions    . ibandronate (BONIVA) 150 MG tablet Take 150 mg by mouth every 30 (thirty) days. Take in the morning with a full glass of water, on an empty stomach, and do not take anything else by mouth or lie down for the next 30 min.    . insulin aspart (NOVOLOG) 100 UNIT/ML injection Check CBG before meals. CBG 121-150 give 2 units  CBG 151-200 give 3 units CBG 201-250 give 5 units CBG 251-300 give 8 units CBG 301-350 give 11 units CBG 351-400 give 15 units If CBG > 400 CALL MD 1 vial 12  . insulin glargine (LANTUS) 100 UNIT/ML injection Inject 0.2 mLs (20 Units total) into the skin daily. 10 mL 12  . isosorbide mononitrate (IMDUR) 30 MG 24 hr tablet 1 tablet by mouth daily 90 tablet 3  . levothyroxine (SYNTHROID, LEVOTHROID) 25 MCG tablet Take 25 mcg by mouth See admin instructions. Take 1 tablet (25 mcg) on Monday, Wednesday, Friday (take 50 mcg on Tues, Thurs, Sat, Sun)    . levothyroxine (SYNTHROID, LEVOTHROID) 50 MCG tablet Take 50 mcg by mouth See admin instructions. Take 1 tablet (50 mcg) on Tuesday, Thursday, Saturday, Sunday (take 25 mcg on Monday, Wednesday  and Friday)    . metoprolol (LOPRESSOR) 50 MG tablet 1 tablet by mouth twice a day 180 tablet 3  . nitroGLYCERIN (NITROSTAT) 0.4 MG SL tablet Place 0.4 mg under the tongue every 5 (five) minutes as needed for chest pain.     Marland Kitchen oxybutynin (DITROPAN XL) 10 MG 24 hr tablet Take 10 mg by mouth daily.     . Prenatal Vit-Fe Fumarate-FA (MULTIVITAMIN-PRENATAL) 27-0.8 MG TABS tablet Take 1 tablet by mouth daily at 12 noon.    . traMADol (ULTRAM) 50 MG tablet Take 50 mg by mouth every 6 (six) hours as needed.     No current facility-administered medications on file prior to visit.

## 2014-08-11 NOTE — Telephone Encounter (Signed)
Offer augmentin 875, # 14, 1 twice daily Mucinex may help- otc

## 2014-08-19 ENCOUNTER — Other Ambulatory Visit: Payer: Self-pay | Admitting: *Deleted

## 2014-08-19 MED ORDER — FLUTICASONE PROPIONATE 50 MCG/ACT NA SUSP: NASAL | Status: DC

## 2014-08-26 ENCOUNTER — Telehealth: Payer: Self-pay | Admitting: *Deleted

## 2014-08-26 ENCOUNTER — Other Ambulatory Visit: Payer: Self-pay | Admitting: *Deleted

## 2014-08-26 MED ORDER — DILTIAZEM HCL ER COATED BEADS 360 MG PO CP24: 360.0000 mg | ORAL_CAPSULE | Freq: Every day | ORAL | Status: DC

## 2014-08-26 NOTE — Telephone Encounter (Signed)
I spoke with the patient and she is aware her RX is ready and being placed at the front desk for pick up.

## 2014-08-26 NOTE — Telephone Encounter (Signed)
Patient stated that she was informed by her mail order pharmacy that the cardizem is unavailable at this time. She would like a written rx that her husband could come by the office to pick [place] and [place]. Thanks, MI

## 2014-09-09 ENCOUNTER — Other Ambulatory Visit: Payer: Self-pay | Admitting: *Deleted

## 2014-09-09 MED ORDER — METOPROLOL TARTRATE 50 MG PO TABS: ORAL_TABLET | ORAL | Status: DC

## 2014-11-12 ENCOUNTER — Ambulatory Visit: Payer: Medicare Other | Admitting: Internal Medicine

## 2014-11-15 ENCOUNTER — Encounter: Payer: Self-pay | Admitting: Internal Medicine

## 2014-11-15 ENCOUNTER — Ambulatory Visit (INDEPENDENT_AMBULATORY_CARE_PROVIDER_SITE_OTHER): Payer: Medicare Other | Admitting: Internal Medicine

## 2014-11-15 VITALS — BP 122/60 | HR 48 | Ht 63.0 in | Wt 184.2 lb

## 2014-11-15 DIAGNOSIS — J40 Bronchitis, not specified as acute or chronic: Secondary | ICD-10-CM | POA: Diagnosis not present

## 2014-11-15 DIAGNOSIS — J441 Chronic obstructive pulmonary disease with (acute) exacerbation: Secondary | ICD-10-CM | POA: Diagnosis not present

## 2014-11-15 DIAGNOSIS — G4733 Obstructive sleep apnea (adult) (pediatric): Secondary | ICD-10-CM | POA: Diagnosis not present

## 2014-11-15 MED ORDER — PHENYLEPHRINE HCL 1 % NA SOLN
3.0000 [drp] | Freq: Once | NASAL | Status: AC
  Administered 2014-11-15: 3 [drp] via NASAL

## 2014-11-15 MED ORDER — METHYLPREDNISOLONE ACETATE 80 MG/ML IJ SUSP
80.0000 mg | Freq: Once | INTRAMUSCULAR | Status: AC
Start: 2014-11-15 — End: 2014-11-15
  Administered 2014-11-15: 80 mg via INTRAMUSCULAR

## 2014-11-15 NOTE — Patient Instructions (Signed)
Neb neo nasal  Depo 55  Ok to continue present meds

## 2014-11-15 NOTE — Progress Notes (Signed)
Patient ID: [name], female    DOB: May 10, 1934, 81 y.o.   MRN: [id number]  HPI 01/11/11- 78 yo former smoker followed for OSA, OHS, occasional tracheobronchitis.Complicated by CAD/AFib, morbid obesity Last here Jun 15, 2010  - note reviewed. Denies significant changes. O2 dependent due to OHS. Complains of allergy with watering eyes and nose. Right ear hurts. Cardiologist added lisinopril for BP- discussed ACEI side effects.  Continues to use CPAP 16 every night with O2 2 L/M  Going to PT rehab to strengthen to reduce fall risk. Heat bothering her.  07/13/11-  55 yo former smoker followed for OSA, OHS, occasional tracheobronchitis.Complicated by CAD/AFib, morbid obesity Has had flu vaccine. She fought off a cold last month. Feels that control of her sleep apnea is good as she continues CPAP 16 with 2 L of oxygen/[facility]. She has noticed her weight is dropping, but she says she is not trying. She does not feel there is something wrong. Frequent nasal drip. No cough or wheeze. CXR 07/13/11 IMPRESSION:  1. Cardiomegaly.  2. Bronchitic changes.  Original Report Authenticated By: Glenice Bow, M.D.   01/11/12-  8 yo former smoker followed for OSA, OHS, occasional tracheobronchitis.Complicated by CAD/AFib, morbid obesity Has nights up able to sleep until 2-3am; CPAP every night for approx 8-9 hours Feels as though Zyrtec and singulair not helping as well right now. Having chest tightness last night and having uneasy "electric feelings" in chest.Cough-brown in color. Good compliance and control with CPAP 16/O2 2 L. Her sleep is occasionally delayed until 2 or 3 AM but she averages 8 or 9 hours of CPAP per night. Zyrtec and Singulair have been less help this spring with her seasonal allergic rhinitis. She is doing better now as the pollen season ends. Complains of "electric twinges" across her anterior chest. Admits reflux. Taking Tessalon for cough with deep breath. We discussed  reflux, cough and substernal discomfort as possibly related. We discussed her chest x-ray report from last visit.  09/08/12- 32 yo former smoker followed for OSA, OHS, occasional tracheobronchitis.Complicated by CAD/AFib, morbid obesity Has been breathing okay. Has not used her inhaler in a long time.  Being treated for stage IV kidney disease and being referred to nephrology. She says she has gradually lost 40 pounds. Our records indicate 11 pound weight loss since 2012. Complains of "allergy"-eyes water, nose runs despite Zyrtec and Singulair. Has continued oxygen 2 L with her CPAP 16/ Advanced. We discussed potential impact of weight loss..  04/23/13-  34 yo former smoker followed for OSA, OHS, occasional tracheobronchitis.Complicated by CAD/AFib, morbid obesity CPAP 16/O2 2 L./ Advanced Weight has been stable around the 192 pounds this year. She is comfortable with her CPAP plus oxygen and is sleeping pretty well. Using a rolling walker. Recently treated for heart failure. Her PCP did CXR/ [facility],.  11/05/13-79 yo former smoker followed for OSA, OHS, occasional tracheobronchitis, COPD/respiratory failure .Complicated by CAD/AFib/ CHF, morbid obesity, GERD, DM CPAP 16/ - quit.    O2 2 L./ Advanced FOLLOWS FOR:  Not wearing CPAP since October-- Reports breathing doing well. Weight down from 215 in 2010 to 163 lbs now Hospitalized in October with pneumonia, then 3 times for congestive heart failure. Continues oxygen 2 L Using Astelin nasal spray without need for rescue inhaler or active bronchodilators. Doing nebulized budesonide once daily  Acute- Dr Gwenette Greet HPI Patient comes in today for an acute sick visit. She is normally followed by Dr. Annamaria Boots for chronic respiratory failure, as  well as obesity hypoventilation syndrome. It is unclear if she has underlying obstructive lung disease. She gives a 2 day history of increasing chest congestion, cough with purulent mucus, and some increased shortness of  breath. She has had subjective fevers, and a neighbor who is a nurse has told her that her lungs sound abnormal.   05/13/14- 42 yo former smoker followed for OSA, OHS, occasional tracheobronchitis, COPD/respiratory failure .Complicated by CAD/AFib/ CHF, morbid obesity, GERD, DM CPAP 16/ - quit.    O2 2 L./ Advanced FOLLOWS FOR:Has trouble sleeping at times,sob occas.,cough-yellow and white,wheezing,denies cp or tightness,pnd,stuffy nose in am,not using CPAP   11/15/14- 70 yo former smoker followed for OSA/ /quit CPAP, OHS, occasional tracheobronchitis, COPD/respiratory failure .Complicated by CAD/AFib/ CHF, morbid obesity, GERD, DM CPAP 16/ - quit.    O2 2 L./ Advanced FOLLOWS FOR: . Pt reports having some breathing issues with the pollen - SOB.  Acute illness last week- malaise, fever, n&V, diarrhea, head congestion, clear mucus. Improving.  Review of Systems-see HPI Constitutional:   No-   Unexpected weight loss, no-night sweats, fevers, chills, fatigue, lassitude. HEENT:   No-  headaches, difficulty swallowing, tooth/dental problems, sore throat,       No-  sneezing, itching, ear ache, +nasal congestion, post nasal drip,  CV:  No-  anginal chest pain, orthopnea, PND, swelling in lower extremities, anasarca, dizziness, palpitations Resp: +  shortness of breath with exertion or at rest.              No-   productive cough,  No non-productive cough,  No- coughing up of blood.              No-   change in color of mucus.  No- wheezing.   Skin: no-rash GI:  +HPI GU:  MS:  No-   joint pain or swelling.   Neuro-     nothing unusual Psych:  No- change in mood or affect. No depression or anxiety.  No memory loss.   Objective:   Physical Exam General- Alert, Oriented, Affect-appropriate, Distress- none acute, still quite obese, O2 2L/M, using a walker Skin- rash-none, lesions- none, excoriation- none Lymphadenopathy- none Head- atraumatic            Eyes- Gross vision intact, PERRLA,  conjunctivae clear secretions, not injected            Ears- Hearing, canals-normal            Nose- +stuffy, no-Septal dev, mucus, polyps, erosion, perforation             Throat- Mallampati III-IV , mucosa clear , drainage- none, tonsils- atrophic Neck- flexible , trachea midline, no stridor , thyroid nl, carotid no bruit Chest - symmetrical excursion , unlabored           Heart/CV- RRR , no murmur , no gallop  , no rub, nl s1 s2                           - JVD- none , edema- none, stasis changes- none, varices- none           Lung- clear to P&A, wheeze- none, cough- none , dullness-none, rub- none           Chest wall-  Abd-  Br/ Gen/ Rectal- Not done, not indicated Extrem- cyanosis- none, clubbing, none, atrophy- none, strength- nl Neuro- grossly intact to observation

## 2014-11-21 NOTE — Assessment & Plan Note (Signed)
Cautioned not to let weight climb back.

## 2014-11-21 NOTE — Assessment & Plan Note (Signed)
Recent viral syndrome with URI and gastroenteritis features. Plan- neb, depomedrol

## 2014-12-04 ENCOUNTER — Emergency Department (HOSPITAL_COMMUNITY): Payer: Medicare Other

## 2014-12-04 ENCOUNTER — Inpatient Hospital Stay (HOSPITAL_COMMUNITY): Payer: Medicare Other

## 2014-12-04 ENCOUNTER — Encounter (HOSPITAL_COMMUNITY): Payer: Self-pay | Admitting: Emergency Medicine

## 2014-12-04 ENCOUNTER — Inpatient Hospital Stay (HOSPITAL_COMMUNITY)
Admission: EM | Admit: 2014-12-04 | Discharge: 2014-12-07 | DRG: 069 | Disposition: A | Discharge status: Home Health | Payer: Medicare Other | Attending: Neurology | Admitting: Neurology

## 2014-12-04 DIAGNOSIS — I639 Cerebral infarction, unspecified: Secondary | ICD-10-CM | POA: Diagnosis present

## 2014-12-04 DIAGNOSIS — Z7951 Long term (current) use of inhaled steroids: Secondary | ICD-10-CM | POA: Diagnosis not present

## 2014-12-04 DIAGNOSIS — Z7902 Long term (current) use of antithrombotics/antiplatelets: Secondary | ICD-10-CM

## 2014-12-04 DIAGNOSIS — I480 Paroxysmal atrial fibrillation: Secondary | ICD-10-CM | POA: Diagnosis present

## 2014-12-04 DIAGNOSIS — I272 Other secondary pulmonary hypertension: Secondary | ICD-10-CM | POA: Diagnosis present

## 2014-12-04 DIAGNOSIS — Z6833 Body mass index (BMI) 33.0-33.9, adult: Secondary | ICD-10-CM

## 2014-12-04 DIAGNOSIS — Z79899 Other long term (current) drug therapy: Secondary | ICD-10-CM

## 2014-12-04 DIAGNOSIS — M810 Age-related osteoporosis without current pathological fracture: Secondary | ICD-10-CM | POA: Diagnosis present

## 2014-12-04 DIAGNOSIS — Z794 Long term (current) use of insulin: Secondary | ICD-10-CM

## 2014-12-04 DIAGNOSIS — Z86718 Personal history of other venous thrombosis and embolism: Secondary | ICD-10-CM

## 2014-12-04 DIAGNOSIS — J449 Chronic obstructive pulmonary disease, unspecified: Secondary | ICD-10-CM | POA: Diagnosis present

## 2014-12-04 DIAGNOSIS — I2510 Atherosclerotic heart disease of native coronary artery without angina pectoris: Secondary | ICD-10-CM | POA: Diagnosis present

## 2014-12-04 DIAGNOSIS — N289 Disorder of kidney and ureter, unspecified: Secondary | ICD-10-CM | POA: Diagnosis present

## 2014-12-04 DIAGNOSIS — E785 Hyperlipidemia, unspecified: Secondary | ICD-10-CM | POA: Diagnosis present

## 2014-12-04 DIAGNOSIS — E1165 Type 2 diabetes mellitus with hyperglycemia: Secondary | ICD-10-CM | POA: Diagnosis present

## 2014-12-04 DIAGNOSIS — Z8543 Personal history of malignant neoplasm of ovary: Secondary | ICD-10-CM | POA: Diagnosis not present

## 2014-12-04 DIAGNOSIS — R911 Solitary pulmonary nodule: Secondary | ICD-10-CM | POA: Diagnosis present

## 2014-12-04 DIAGNOSIS — I10 Essential (primary) hypertension: Secondary | ICD-10-CM | POA: Diagnosis present

## 2014-12-04 DIAGNOSIS — G4733 Obstructive sleep apnea (adult) (pediatric): Secondary | ICD-10-CM | POA: Diagnosis present

## 2014-12-04 DIAGNOSIS — I701 Atherosclerosis of renal artery: Secondary | ICD-10-CM | POA: Diagnosis present

## 2014-12-04 DIAGNOSIS — I4891 Unspecified atrial fibrillation: Secondary | ICD-10-CM | POA: Diagnosis not present

## 2014-12-04 DIAGNOSIS — Z87891 Personal history of nicotine dependence: Secondary | ICD-10-CM

## 2014-12-04 DIAGNOSIS — F17210 Nicotine dependence, cigarettes, uncomplicated: Secondary | ICD-10-CM | POA: Diagnosis present

## 2014-12-04 DIAGNOSIS — R471 Dysarthria and anarthria: Secondary | ICD-10-CM | POA: Diagnosis present

## 2014-12-04 DIAGNOSIS — G451 Carotid artery syndrome (hemispheric): Secondary | ICD-10-CM | POA: Diagnosis not present

## 2014-12-04 DIAGNOSIS — K219 Gastro-esophageal reflux disease without esophagitis: Secondary | ICD-10-CM | POA: Diagnosis present

## 2014-12-04 DIAGNOSIS — Z833 Family history of diabetes mellitus: Secondary | ICD-10-CM

## 2014-12-04 DIAGNOSIS — Z720 Tobacco use: Secondary | ICD-10-CM | POA: Diagnosis not present

## 2014-12-04 DIAGNOSIS — E669 Obesity, unspecified: Secondary | ICD-10-CM | POA: Diagnosis present

## 2014-12-04 DIAGNOSIS — Z881 Allergy status to other antibiotic agents status: Secondary | ICD-10-CM

## 2014-12-04 DIAGNOSIS — I481 Persistent atrial fibrillation: Secondary | ICD-10-CM | POA: Diagnosis not present

## 2014-12-04 DIAGNOSIS — G459 Transient cerebral ischemic attack, unspecified: Secondary | ICD-10-CM | POA: Diagnosis present

## 2014-12-04 DIAGNOSIS — R4701 Aphasia: Secondary | ICD-10-CM | POA: Diagnosis present

## 2014-12-04 DIAGNOSIS — IMO0002 Reserved for concepts with insufficient information to code with codable children: Secondary | ICD-10-CM | POA: Diagnosis present

## 2014-12-04 DIAGNOSIS — Z7901 Long term (current) use of anticoagulants: Secondary | ICD-10-CM | POA: Diagnosis not present

## 2014-12-04 DIAGNOSIS — I6789 Other cerebrovascular disease: Secondary | ICD-10-CM | POA: Diagnosis not present

## 2014-12-04 LAB — RAPID URINE DRUG SCREEN, HOSP PERFORMED
Amphetamines: NOT DETECTED
BENZODIAZEPINES: NOT DETECTED
Barbiturates: NOT DETECTED
COCAINE: NOT DETECTED
Opiates: NOT DETECTED
TETRAHYDROCANNABINOL: NOT DETECTED

## 2014-12-04 LAB — URINALYSIS, ROUTINE W REFLEX MICROSCOPIC
Bilirubin Urine: NEGATIVE
Glucose, UA: NEGATIVE mg/dL
HGB URINE DIPSTICK: NEGATIVE
Ketones, ur: NEGATIVE mg/dL
Leukocytes, UA: NEGATIVE
NITRITE: NEGATIVE
PH: 6.5 (ref 5.0–8.0)
PROTEIN: 100 mg/dL — AB
Specific Gravity, Urine: 1.006 (ref 1.005–1.030)
UROBILINOGEN UA: 0.2 mg/dL (ref 0.0–1.0)

## 2014-12-04 LAB — CBG MONITORING, ED: GLUCOSE-CAPILLARY: 144 mg/dL — AB (ref 70–99)

## 2014-12-04 LAB — CBC
HCT: 43 % (ref 36.0–46.0)
Hemoglobin: 13.8 g/dL (ref 12.0–15.0)
MCH: 30.4 pg (ref 26.0–34.0)
MCHC: 32.1 g/dL (ref 30.0–36.0)
MCV: 94.7 fL (ref 78.0–100.0)
PLATELETS: 221 10*3/uL (ref 150–400)
RBC: 4.54 MIL/uL (ref 3.87–5.11)
RDW: 14.9 % (ref 11.5–15.5)
WBC: 7.5 10*3/uL (ref 4.0–10.5)

## 2014-12-04 LAB — URINE MICROSCOPIC-ADD ON

## 2014-12-04 LAB — DIFFERENTIAL
BASOS PCT: 0 % (ref 0–1)
Basophils Absolute: 0 10*3/uL (ref 0.0–0.1)
EOS ABS: 0.2 10*3/uL (ref 0.0–0.7)
EOS PCT: 2 % (ref 0–5)
Lymphocytes Relative: 22 % (ref 12–46)
Lymphs Abs: 1.7 10*3/uL (ref 0.7–4.0)
MONO ABS: 0.5 10*3/uL (ref 0.1–1.0)
MONOS PCT: 6 % (ref 3–12)
Neutro Abs: 5.2 10*3/uL (ref 1.7–7.7)
Neutrophils Relative %: 70 % (ref 43–77)

## 2014-12-04 LAB — COMPREHENSIVE METABOLIC PANEL
ALBUMIN: 3.9 g/dL (ref 3.5–5.2)
ALK PHOS: 77 U/L (ref 39–117)
ALT: 25 U/L (ref 0–35)
AST: 15 U/L (ref 0–37)
Anion gap: 11 (ref 5–15)
BUN: 33 mg/dL — ABNORMAL HIGH (ref 6–23)
CO2: 30 mmol/L (ref 19–32)
Calcium: 9.7 mg/dL (ref 8.4–10.5)
Chloride: 98 mmol/L (ref 96–112)
Creatinine, Ser: 1.67 mg/dL — ABNORMAL HIGH (ref 0.50–1.10)
GFR calc Af Amer: 33 mL/min — ABNORMAL LOW (ref >90)
GFR calc non Af Amer: 28 mL/min — ABNORMAL LOW (ref >90)
Glucose, Bld: 150 mg/dL — ABNORMAL HIGH (ref 70–99)
Potassium: 3.8 mmol/L (ref 3.5–5.1)
SODIUM: 139 mmol/L (ref 135–145)
TOTAL PROTEIN: 7.6 g/dL (ref 6.0–8.3)
Total Bilirubin: 0.7 mg/dL (ref 0.3–1.2)

## 2014-12-04 LAB — APTT: APTT: 32 s (ref 24–37)

## 2014-12-04 LAB — I-STAT TROPONIN, ED: Troponin i, poc: 0 ng/mL (ref 0.00–0.08)

## 2014-12-04 LAB — PROTIME-INR
INR: 1.04 (ref 0.00–1.49)
Prothrombin Time: 13.8 seconds (ref 11.6–15.2)

## 2014-12-04 LAB — GLUCOSE, CAPILLARY: GLUCOSE-CAPILLARY: 149 mg/dL — AB (ref 70–99)

## 2014-12-04 MED ORDER — ACETAMINOPHEN 325 MG PO TABS
650.0000 mg | ORAL_TABLET | ORAL | Status: DC | PRN
  Administered 2014-12-05 – 2014-12-07 (×12): 650 mg via ORAL
  Filled 2014-12-04 (×12): qty 2

## 2014-12-04 MED ORDER — INSULIN ASPART 100 UNIT/ML ~~LOC~~ SOLN
0.0000 [IU] | Freq: Three times a day (TID) | SUBCUTANEOUS | Status: DC
  Administered 2014-12-05: 3 [IU] via SUBCUTANEOUS
  Administered 2014-12-05 – 2014-12-06 (×3): 5 [IU] via SUBCUTANEOUS
  Administered 2014-12-06 – 2014-12-07 (×4): 3 [IU] via SUBCUTANEOUS

## 2014-12-04 MED ORDER — SODIUM CHLORIDE 0.9 % IV SOLN: 50.0000 mL | Freq: Once | INTRAVENOUS | Status: DC

## 2014-12-04 MED ORDER — CETYLPYRIDINIUM CHLORIDE 0.05 % MT LIQD
7.0000 mL | Freq: Two times a day (BID) | OROMUCOSAL | Status: DC
  Administered 2014-12-04 – 2014-12-05 (×3): 7 mL via OROMUCOSAL

## 2014-12-04 MED ORDER — ACETAMINOPHEN 650 MG RE SUPP: 650.0000 mg | RECTAL | Status: DC | PRN

## 2014-12-04 MED ORDER — ALTEPLASE (STROKE) FULL DOSE INFUSION
0.9000 mg/kg | Freq: Once | INTRAVENOUS | Status: AC
  Administered 2014-12-04: 78 mg via INTRAVENOUS
  Filled 2014-12-04: qty 78

## 2014-12-04 MED ORDER — SODIUM CHLORIDE 0.9 % IV SOLN
INTRAVENOUS | Status: DC
Start: 2014-12-04 — End: 2014-12-06
  Administered 2014-12-04 – 2014-12-05 (×3): via INTRAVENOUS

## 2014-12-04 MED ORDER — STROKE: EARLY STAGES OF RECOVERY BOOK
Freq: Once | Status: AC
  Administered 2014-12-05: 01:00:00
  Filled 2014-12-04 (×2): qty 1

## 2014-12-04 MED ORDER — PANTOPRAZOLE SODIUM 40 MG IV SOLR
40.0000 mg | Freq: Every day | INTRAVENOUS | Status: DC
  Administered 2014-12-04: 40 mg via INTRAVENOUS
  Filled 2014-12-04 (×2): qty 40

## 2014-12-04 MED ORDER — HYDRALAZINE HCL 20 MG/ML IJ SOLN
5.0000 mg | INTRAMUSCULAR | Status: DC | PRN
  Administered 2014-12-04 – 2014-12-05 (×4): 5 mg via INTRAVENOUS
  Filled 2014-12-04 (×4): qty 1

## 2014-12-04 MED ORDER — SENNOSIDES-DOCUSATE SODIUM 8.6-50 MG PO TABS
1.0000 | ORAL_TABLET | Freq: Every evening | ORAL | Status: DC | PRN
  Filled 2014-12-04: qty 1

## 2014-12-04 MED ORDER — LABETALOL HCL 5 MG/ML IV SOLN: 10.0000 mg | INTRAVENOUS | Status: DC | PRN

## 2014-12-04 MED ORDER — ALTEPLASE (STROKE) FULL DOSE INFUSION: 0.9000 mg/kg | Freq: Once | INTRAVENOUS | Status: DC

## 2014-12-04 NOTE — ED Notes (Signed)
No foley placed, pt able to communicate when she needs to urinate

## 2014-12-04 NOTE — ED Notes (Signed)
Pt's family member states the pt started having difficulty speaking one hour prior to arrival to department.

## 2014-12-04 NOTE — ED Notes (Signed)
Reynolds MD at bedside.

## 2014-12-04 NOTE — ED Notes (Signed)
[id number]

## 2014-12-04 NOTE — H&P (Signed)
Admission H&P    Chief Complaint: Difficulty with speech  HPI: [name] is an 81 y.o. female with a history of atrial fibrillation who was riding with her family today when they noted acutely that she was unable to express herself.  They did not note that she had any weakness.  She was not complaining of SOB but was on O2.  Patient was brought in for evaluation at that time.  Initial NIHSS of 5.    Date last known well: Date: 12/04/2014 Time last known well: Time: 18:05 tPA Given: Yes Preadmission mRankin:  3   Past Medical History  Diagnosis Date  . CAD (coronary artery disease)   . CHF (congestive heart failure)   . Paroxysmal atrial fibrillation   . Bradycardia   . Hypertension   . Renal artery stenosis   . COPD (chronic obstructive pulmonary disease)   . Obesity hypoventilation syndrome   . Pruritus   . Tracheobronchitis   . Acute rhinosinusitis   . Anemia   . Hypoxemia   . Peripheral edema   . Obesity, endogenous   . Diabetes mellitus   . Acid reflux disease   . Osteoarthritis, generalized   . Hypothyroidism   . Lung nodule     lingula  . OSA (obstructive sleep apnea)     NPSG 04/03/00--AHI 28.hr  . DVT (deep venous thrombosis) 2003    left leg  . Vaginal atrophy   . Hx of colonic polyps   . Acute diastolic heart failure   . Secondary pulmonary hypertension 06/09/2013  . Blood transfusion without reported diagnosis 2009    had internal bleeding  . Cancer 1959    ovarian cancer  . Osteoporosis     Past Surgical History  Procedure Laterality Date  . Lumbar spine surgery    . Cholecystectomy    . Hemorroidectomy    . Repair wound fistula    . Right shoulder    . Elbow surgery    . Dilation and curettage of uterus    . Breast surgery  1998    lumpectomy  . Oophorectomy  1959    BSO-? ovarian cancer  . Bilateral salpingoophorectomy  1959    -d/t ovarian cancer  . Rotator cuff repair Right     Family History  Problem Relation Age of  Onset  . Pneumonia Mother   . Heart disease Mother   . Allergies Mother   . Emphysema Mother   . Arthritis Mother   . Hypertension Mother   . Heart attack Father   . Hypertension Father   . Diabetes Sister   . Heart disease Sister   . Diabetes Brother   . Diabetes      grandfather  . Breast cancer      aunt  . Bone cancer      aunt  . Clotting disorder Brother   . Arthritis Sister   . Breast cancer Maternal Aunt    Social History:  reports that she quit smoking about 36 years ago. She has never used smokeless tobacco. She reports that she does not drink alcohol or use illicit drugs.  Allergies:  Allergies  Allergen Reactions  . Cefuroxime Axetil Shortness Of Breath, Swelling and Rash    Throat swelling  . Cephalexin Shortness Of Breath, Swelling and Rash    Throat swelling  . Clarithromycin Shortness Of Breath, Swelling and Rash    Throat swelling  . Doxycycline Shortness Of Breath, Swelling and Rash  rash on legs and feet, throat swelling  . Erythromycin Shortness Of Breath, Swelling and Rash    Throat swelling  . Tetracycline Shortness Of Breath, Swelling and Rash    Throat swelling    Medications: Current outpatient prescriptions:  .  acetaminophen (TYLENOL) 325 MG tablet, Take 2 tablets (650 mg total) by mouth every 6 (six) hours as needed for mild pain or headache., Disp: , Rfl:  .  albuterol (PROAIR HFA) 108 (90 BASE) MCG/ACT inhaler, 1-2 puffs QID prn, Disp: 1 Inhaler, Rfl: 6 .  allopurinol (ZYLOPRIM) 100 MG tablet, Take 1 tablet (100 mg total) by mouth daily., Disp: , Rfl:  .  amoxicillin-clavulanate (AUGMENTIN) 875-125 MG per tablet, Take 1 tablet by mouth 2 (two) times daily., Disp: 14 tablet, Rfl: 0 .  azelastine (ASTELIN) 137 MCG/SPRAY nasal spray, 1-2 sprays each nostril once or twice daily, Disp: 90 mL, Rfl: 3 .  budesonide (PULMICORT) 0.25 MG/2ML nebulizer solution, Take 2 mLs (0.25 mg total) by nebulization daily., Disp: 60 mL, Rfl: prn .  Calcium  Carbonate-Vitamin D (CALCIUM-VITAMIN D) 500-200 MG-UNIT per tablet, Take 1 tablet by mouth daily., Disp: , Rfl:  .  clopidogrel (PLAVIX) 75 MG tablet, Take 1 tablet (75 mg total) by mouth daily., Disp: 90 tablet, Rfl: 3 .  colchicine 0.6 MG tablet, Take 0.6 mg by mouth as needed. Takes as needed for gout attacks., Disp: , Rfl:  .  diltiazem (CARDIZEM CD) 360 MG 24 hr capsule, Take 1 capsule (360 mg total) by mouth daily. (Patient taking differently: Take 360 mg by mouth 2 (two) times daily. ), Disp: 90 capsule, Rfl: 3 .  fluticasone (FLONASE) 50 MCG/ACT nasal spray, 1-2 sprays each nostril 1-2 times daily, Disp: 48 g, Rfl: 4 .  furosemide (LASIX) 40 MG tablet, Take 1 tablet (40 mg total) by mouth 2 (two) times daily. Contact cardiology office or PCP if you experienced > 3 pounds overnight or > 5 pounds in 1 week for further instructions, Disp: , Rfl:  .  insulin aspart (NOVOLOG) 100 UNIT/ML injection, Check CBG before meals. CBG 121-150 give 2 units  CBG 151-200 give 3 units CBG 201-250 give 5 units CBG 251-300 give 8 units CBG 301-350 give 11 units CBG 351-400 give 15 units If CBG > 400 CALL MD (Patient taking differently: Check CBG before meals. CBG 121-150 give 2 units  CBG 151-200 give 3 units CBG 201-250 give 5 units  13 units in AM (if over 200) and 35 units in PM - 11/15/14 CBG 251-300 give 8 units CBG 301-350 give 11 units CBG 351-400 give 15 units If CBG > 400 CALL MD), Disp: 1 vial, Rfl: 12 .  insulin glargine (LANTUS) 100 UNIT/ML injection, Inject 0.2 mLs (20 Units total) into the skin daily., Disp: 10 mL, Rfl: 12 .  isosorbide mononitrate (IMDUR) 30 MG 24 hr tablet, 1 tablet by mouth daily, Disp: 90 tablet, Rfl: 3 .  levothyroxine (SYNTHROID, LEVOTHROID) 25 MCG tablet, Take 25 mcg by mouth See admin instructions. Take 1 tablet (25 mcg) on Monday, Wednesday, Friday (take 50 mcg on Tues, Thurs, Sat, Sun), Disp: , Rfl:  .  levothyroxine (SYNTHROID, LEVOTHROID) 50 MCG tablet, Take 50 mcg by mouth See  admin instructions. Take 1 tablet (50 mcg) on Tuesday, Thursday, Saturday, Sunday (take 25 mcg on Monday, Wednesday and Friday), Disp: , Rfl:  .  metoprolol (LOPRESSOR) 50 MG tablet, 1 tablet by mouth twice a day, Disp: 180 tablet, Rfl: 3 .  nitroGLYCERIN (NITROSTAT)  0.4 MG SL tablet, Place 0.4 mg under the tongue every 5 (five) minutes as needed for chest pain. , Disp: , Rfl:  .  oxybutynin (DITROPAN XL) 10 MG 24 hr tablet, Take 10 mg by mouth daily. , Disp: , Rfl:  .  Prenatal Vit-Fe Fumarate-FA (MULTIVITAMIN-PRENATAL) 27-0.8 MG TABS tablet, Take 1 tablet by mouth daily at 12 noon., Disp: , Rfl:  .  traMADol (ULTRAM) 50 MG tablet, Take 50 mg by mouth every 6 (six) hours as needed., Disp: , Rfl:   ROS: Unable to provide due to speech difficulties  Physical Examination: Blood pressure 157/55, pulse 54, temperature 98.2 F (36.8 C), temperature source Oral, resp. rate 20, weight 86.183 kg (190 lb), last menstrual period 08/13/1957, SpO2 94 %.  General Examination: HEENT-  Normocephalic, no lesions, without obvious abnormality.  Normal external eye and conjunctiva.  Normal TM's bilaterally.  Normal auditory canals and external ears. Normal external nose, mucus membranes and septum.  Normal pharynx. Cardiovascular- S1, S2 normal, pulses palpable throughout   Lungs- chest clear, no wheezing, rales, normal symmetric air entry Abdomen- soft, non-tender; bowel sounds normal; no masses,  no organomegaly Extremities- no edema Lymph-no adenopathy palpable Musculoskeletal-no joint tenderness, deformity or swelling Skin-warm and dry, no hyperpigmentation, vitiligo, or suspicious lesions  Neurological Examination Mental Status: Alert.  No intelligible speech appreciated.  Able to follow commands but can not give me the number for what month it is and can not put up her fingers for the age.  Cranial Nerves: II: Discs flat bilaterally; Visual fields grossly normal, pupils equal, round, reactive to light  and accommodation III,IV, VI: ptosis not present, extra-ocular motions intact bilaterally V,VII: mild right facial droop, facial light touch sensation normal bilaterally VIII: hearing normal bilaterally IX,X: gag reflex present XI: bilateral shoulder shrug XII: midline tongue extension Motor: Right : Upper extremity   5/5    Left:     Upper extremity   5/5  Lower extremity   5/5     Lower extremity   5/5 Tone and bulk:normal tone throughout; no atrophy noted Sensory: Pinprick and light touch intact throughout, bilaterally Deep Tendon Reflexes: 2+ in the upper extremities and absent in the lower extremities Plantars: Right: equivocal   Left: equivocal Cerebellar: normal finger-to-nose and normal heel-to-shin testing bilaterally Gait: not tested due to safety concerns   Laboratory Studies:   Basic Metabolic Panel:  Recent Labs Lab 12/04/14 1515  NA 139  K 3.8  CL 98  CO2 30  GLUCOSE 150*  BUN 33*  CREATININE 1.67*  CALCIUM 9.7    Liver Function Tests:  Recent Labs Lab 12/04/14 1515  AST 15  ALT 25  ALKPHOS 77  BILITOT 0.7  PROT 7.6  ALBUMIN 3.9   No results for input(s): LIPASE, AMYLASE in the last 168 hours. No results for input(s): AMMONIA in the last 168 hours.  CBC:  Recent Labs Lab 12/04/14 1515  WBC 7.5  NEUTROABS 5.2  HGB 13.8  HCT 43.0  MCV 94.7  PLT 221    Cardiac Enzymes: No results for input(s): CKTOTAL, CKMB, CKMBINDEX, TROPONINI in the last 168 hours.  BNP: Invalid input(s): POCBNP  CBG:  Recent Labs Lab 12/04/14 1926  GLUCAP 144*    Microbiology: Results for orders placed or performed during the hospital encounter of 07/15/13  Blood culture (routine x 2)     Status: None   Collection Time: 07/15/13  8:34 PM  Result Value Ref Range Status   Specimen Description BLOOD  ARM RIGHT  Final   Special Requests BOTTLES DRAWN AEROBIC ONLY 3CC  Final   Culture  Setup Time   Final    07/16/2013 01:45 Performed at [facility]   Final    NO GROWTH 5 DAYS Performed at [facility]   Report Status 07/22/2013 FINAL  Final  Blood culture (routine x 2)     Status: None   Collection Time: 07/15/13  8:45 PM  Result Value Ref Range Status   Specimen Description BLOOD ARM LEFT  Final   Special Requests BOTTLES DRAWN AEROBIC ONLY 10CC  Final   Culture  Setup Time   Final    07/16/2013 01:45 Performed at [facility]   Final    NO GROWTH 5 DAYS Performed at [facility]   Report Status 07/22/2013 FINAL  Final    Coagulation Studies:  Recent Labs  12/04/14 1515  LABPROT 13.8  INR 1.04    Urinalysis: No results for input(s): COLORURINE, LABSPEC, PHURINE, GLUCOSEU, HGBUR, BILIRUBINUR, KETONESUR, PROTEINUR, UROBILINOGEN, NITRITE, LEUKOCYTESUR in the last 168 hours.  Invalid input(s): APPERANCEUR  Lipid Panel:  No results found for: CHOL, TRIG, HDL, CHOLHDL, VLDL, LDLCALC  HgbA1C:  Lab Results  Component Value Date   HGBA1C 7.7* 06/07/2013    Urine Drug Screen:  No results found for: LABOPIA, COCAINSCRNUR, LABBENZ, AMPHETMU, THCU, LABBARB  Alcohol Level: No results for input(s): ETH in the last 168 hours.  Other results: EKG: sinus bradycardia at 52 bpm.  Imaging: Ct Head (brain) Wo Contrast  12/04/2014   CLINICAL DATA:  Code stroke. New onset aphasia since 6 p.m. Today.  EXAM: CT HEAD WITHOUT CONTRAST  TECHNIQUE: Contiguous axial images were obtained from the base of the skull through the vertex without intravenous contrast.  COMPARISON:  06/07/2013 CT head.  FINDINGS: Mild atrophy.  Moderate chronic microvascular ischemic change.  No definite acute cortical infarction. No hemorrhage, mass lesion, hydrocephalus, or extra-axial fluid. Possible hyperattenuating LEFT middle cerebral artery M3 branch or branches as seen on images 16 and 17 series 2 could represent distal emboli. Hypoattenuation of the subinsular white matter as seen on image 17 was probably  present in 2014. No definite signs of proximal large vessel occlusion. Moderate vascular calcification however.  Calvarium intact. No sinus or mastoid air fluid levels. Negative orbits.  IMPRESSION: No definite acute cortical infarction. Chronic changes as described.  Possible hyperattenuating LEFT middle cerebral artery M3 branch or branches could be indicative of distal emboli.  Critical Value/emergent results were called by telephone at the time of interpretation on 12/04/2014 at 7:37 pm to Dr. Doy Mince , who verbally acknowledged these results.   Electronically Signed   By: Rolla Flatten M.D.   On: 12/04/2014 19:39    Assessment: 81 y.o. female with a history of atrial fibrillation on Plavix who presents with a mild right facial droop and an expressive aphasia.  NIHSS of 5.  Head CT personally reviewed and shows no acute changes.  Risks and benefits of tPA discussed with patient and family.  Contraindications reviewed and verbal consent obtained.  tPA administered.  Patient to be admitted to NICU.    Stroke Risk Factors - atrial fibrillation, diabetes mellitus and hypertension  Plan: 1. HgbA1c, fasting lipid panel 2. MRI, MRA  of the brain without contrast 3. PT consult, OT consult, Speech consult 4. Echocardiogram 5. Carotid dopplers 6. Prophylactic therapy-None 7. NPO until RN stroke swallow screen 8. Telemetry monitoring  9. Frequent neuro checks 10. Sliding scale insulin 11. Repeat head CT in 24 hours   This patient is critically ill and at significant risk of neurological worsening, death and care requires constant monitoring of vital signs, hemodynamics,respiratory and cardiac monitoring, neurological assessment, discussion with family, other specialists and medical decision making of high complexity. I spent 60 minutes of neurocritical care time  in the care of  this patient.  Alexis Goodell, MD [facility] [phone] 12/04/2014  8:19 PM

## 2014-12-04 NOTE — ED Notes (Signed)
Last seen normal 1805 per pt's family member.

## 2014-12-04 NOTE — ED Provider Notes (Signed)
CSN: [id number]     Arrival date & time 12/04/14  1904 History   First MD Initiated Contact with Patient 12/04/14 1929     Chief Complaint  Patient presents with  . Code Stroke   (Consider location/radiation/quality/duration/timing/severity/associated sxs/prior Treatment) HPI  Terri L Honstetteris a 81 yo female presenting with sudden LOC.  Family reports they were driving to dinner and at 6:05 PM, the pt suddenly became unresponsive for a few minutes.  When she opened her eyes she could follow commands but she could not speak.  She could make sounds but could not formulate words.  The family denies any recent illness or injury. She shakes her head when asked if she is in any pain.    Past Medical History  Diagnosis Date  . CAD (coronary artery disease)   . CHF (congestive heart failure)   . Paroxysmal atrial fibrillation   . Bradycardia   . Hypertension   . Renal artery stenosis   . COPD (chronic obstructive pulmonary disease)   . Obesity hypoventilation syndrome   . Pruritus   . Tracheobronchitis   . Acute rhinosinusitis   . Anemia   . Hypoxemia   . Peripheral edema   . Obesity, endogenous   . Diabetes mellitus   . Acid reflux disease   . Osteoarthritis, generalized   . Hypothyroidism   . Lung nodule     lingula  . OSA (obstructive sleep apnea)     NPSG 04/03/00--AHI 28.hr  . DVT (deep venous thrombosis) 2003    left leg  . Vaginal atrophy   . Hx of colonic polyps   . Acute diastolic heart failure   . Secondary pulmonary hypertension 06/09/2013  . Blood transfusion without reported diagnosis 2009    had internal bleeding  . Cancer 1959    ovarian cancer  . Osteoporosis    Past Surgical History  Procedure Laterality Date  . Lumbar spine surgery    . Cholecystectomy    . Hemorroidectomy    . Repair wound fistula    . Right shoulder    . Elbow surgery    . Dilation and curettage of uterus    . Breast surgery  1998    lumpectomy  . Oophorectomy  1959     BSO-? ovarian cancer  . Bilateral salpingoophorectomy  1959    -d/t ovarian cancer  . Rotator cuff repair Right    Family History  Problem Relation Age of Onset  . Pneumonia Mother   . Heart disease Mother   . Allergies Mother   . Emphysema Mother   . Arthritis Mother   . Hypertension Mother   . Heart attack Father   . Hypertension Father   . Diabetes Sister   . Heart disease Sister   . Diabetes Brother   . Diabetes      grandfather  . Breast cancer      aunt  . Bone cancer      aunt  . Clotting disorder Brother   . Arthritis Sister   . Breast cancer Maternal Aunt    History  Substance Use Topics  . Smoking status: Former Smoker    Quit date: 08/13/1978  . Smokeless tobacco: Never Used  . Alcohol Use: No   OB History    Gravida Para Term Preterm AB TAB SAB Ectopic Multiple Living   0               Obstetric Comments   2 adopted children  Review of Systems  Constitutional: Negative for fever.  HENT: Negative for sore throat.   Respiratory: Negative for cough.   Cardiovascular: Negative for chest pain.  Gastrointestinal: Negative for vomiting.  Genitourinary: Negative for dysuria.  Musculoskeletal: Negative for myalgias.  Skin: Negative for rash.  Neurological: Positive for syncope and speech difficulty. Negative for weakness and numbness.    Allergies  Cefuroxime axetil; Cephalexin; Clarithromycin; Doxycycline; Erythromycin; and Tetracycline  Home Medications   Prior to Admission medications   Medication Sig Start Date End Date Taking? Authorizing Provider  acetaminophen (TYLENOL) 325 MG tablet Take 2 tablets (650 mg total) by mouth every 6 (six) hours as needed for mild pain or headache. 07/21/13   Samella Parr, NP  albuterol [facility] HFA) 108 (90 BASE) MCG/ACT inhaler 1-2 puffs QID prn 11/12/13   Deneise Lever, MD  allopurinol (ZYLOPRIM) 100 MG tablet Take 1 tablet (100 mg total) by mouth daily. 07/21/13   Samella Parr, NP   amoxicillin-clavulanate (AUGMENTIN) 875-125 MG per tablet Take 1 tablet by mouth 2 (two) times daily. 08/11/14   Deneise Lever, MD  azelastine (ASTELIN) 137 MCG/SPRAY nasal spray 1-2 sprays each nostril once or twice daily 11/05/13   Deneise Lever, MD  budesonide (PULMICORT) 0.25 MG/2ML nebulizer solution Take 2 mLs (0.25 mg total) by nebulization daily. 05/13/14   Deneise Lever, MD  Calcium Carbonate-Vitamin D (CALCIUM-VITAMIN D) 500-200 MG-UNIT per tablet Take 1 tablet by mouth daily.    Historical Provider, MD  clopidogrel (PLAVIX) 75 MG tablet Take 1 tablet (75 mg total) by mouth daily. 12/09/13   Jettie Booze, MD  colchicine 0.6 MG tablet Take 0.6 mg by mouth as needed. Takes as needed for gout attacks.    Historical Provider, MD  diltiazem (CARDIZEM CD) 360 MG 24 hr capsule Take 1 capsule (360 mg total) by mouth daily. Patient taking differently: Take 360 mg by mouth 2 (two) times daily.  08/26/14   Jettie Booze, MD  fluticasone (FLONASE) 50 MCG/ACT nasal spray 1-2 sprays each nostril 1-2 times daily 08/19/14   Deneise Lever, MD  furosemide (LASIX) 40 MG tablet Take 1 tablet (40 mg total) by mouth 2 (two) times daily. Contact cardiology office or PCP if you experienced > 3 pounds overnight or > 5 pounds in 1 week for further instructions 09/12/13   Barton Dubois, MD  insulin aspart (NOVOLOG) 100 UNIT/ML injection Check CBG before meals. CBG 121-150 give 2 units  CBG 151-200 give 3 units CBG 201-250 give 5 units CBG 251-300 give 8 units CBG 301-350 give 11 units CBG 351-400 give 15 units If CBG > 400 CALL MD Patient taking differently: Check CBG before meals. CBG 121-150 give 2 units  CBG 151-200 give 3 units CBG 201-250 give 5 units  13 units in AM (if over 200) and 35 units in PM - 11/15/14 CBG 251-300 give 8 units CBG 301-350 give 11 units CBG 351-400 give 15 units If CBG > 400 CALL MD 07/21/13   Samella Parr, NP  insulin glargine (LANTUS) 100 UNIT/ML injection  Inject 0.2 mLs (20 Units total) into the skin daily. 07/21/13   Samella Parr, NP  isosorbide mononitrate (IMDUR) 30 MG 24 hr tablet 1 tablet by mouth daily 12/09/13   Jettie Booze, MD  levothyroxine (SYNTHROID, LEVOTHROID) 25 MCG tablet Take 25 mcg by mouth See admin instructions. Take 1 tablet (25 mcg) on Monday, Wednesday, Friday (take 50 mcg on Tues, Thurs, Sat,  Nancy Fetter)    Historical Provider, MD  levothyroxine (SYNTHROID, LEVOTHROID) 50 MCG tablet Take 50 mcg by mouth See admin instructions. Take 1 tablet (50 mcg) on Tuesday, Thursday, Saturday, Sunday (take 25 mcg on Monday, Wednesday and Friday)    Historical Provider, MD  metoprolol (LOPRESSOR) 50 MG tablet 1 tablet by mouth twice a day 09/09/14   Jettie Booze, MD  nitroGLYCERIN (NITROSTAT) 0.4 MG SL tablet Place 0.4 mg under the tongue every 5 (five) minutes as needed for chest pain.     Historical Provider, MD  oxybutynin (DITROPAN XL) 10 MG 24 hr tablet Take 10 mg by mouth daily.     Historical Provider, MD  Prenatal Vit-Fe Fumarate-FA (MULTIVITAMIN-PRENATAL) 27-0.8 MG TABS tablet Take 1 tablet by mouth daily at 12 noon.    Historical Provider, MD  traMADol (ULTRAM) 50 MG tablet Take 50 mg by mouth every 6 (six) hours as needed.    Historical Provider, MD   LMP 08/13/1957 Physical Exam  Constitutional: She appears well-developed and well-nourished. No distress.  HENT:  Head: Normocephalic and atraumatic.  Eyes: Conjunctivae are normal.  Neck: Neck supple.  Cardiovascular: Regular rhythm and intact distal pulses.  Bradycardia present.   Pulmonary/Chest: Effort normal and breath sounds normal. No respiratory distress.  Abdominal: Soft. There is no tenderness.  Musculoskeletal: She exhibits no tenderness.  Neurological: She is alert.  Exam deferred, Neurologist at the bedside  Skin: Skin is warm and dry. No rash noted. She is not diaphoretic.  Psychiatric: She has a normal mood and affect.  Nursing note and vitals  reviewed.   ED Course  Procedures (including critical care time) Labs Review Labs Reviewed  CBG MONITORING, ED - Abnormal; Notable for the following:    Glucose-Capillary 144 (*)    All other components within normal limits  PROTIME-INR  APTT  CBC  DIFFERENTIAL  COMPREHENSIVE METABOLIC PANEL  I-STAT TROPOININ, ED    Imaging Review Ct Head (brain) Wo Contrast  12/04/2014   CLINICAL DATA:  Code stroke. New onset aphasia since 6 p.m. Today.  EXAM: CT HEAD WITHOUT CONTRAST  TECHNIQUE: Contiguous axial images were obtained from the base of the skull through the vertex without intravenous contrast.  COMPARISON:  06/07/2013 CT head.  FINDINGS: Mild atrophy.  Moderate chronic microvascular ischemic change.  No definite acute cortical infarction. No hemorrhage, mass lesion, hydrocephalus, or extra-axial fluid. Possible hyperattenuating LEFT middle cerebral artery M3 branch or branches as seen on images 16 and 17 series 2 could represent distal emboli. Hypoattenuation of the subinsular white matter as seen on image 17 was probably present in 2014. No definite signs of proximal large vessel occlusion. Moderate vascular calcification however.  Calvarium intact. No sinus or mastoid air fluid levels. Negative orbits.  IMPRESSION: No definite acute cortical infarction. Chronic changes as described.  Possible hyperattenuating LEFT middle cerebral artery M3 branch or branches could be indicative of distal emboli.  Critical Value/emergent results were called by telephone at the time of interpretation on 12/04/2014 at 7:37 pm to Dr. Doy Mince , who verbally acknowledged these results.   Electronically Signed   By: Rolla Flatten M.D.   On: 12/04/2014 19:39     EKG Interpretation   Date/Time:  Saturday December 04 2014 19:39:56 EDT Ventricular Rate:  52 PR Interval:  175 QRS Duration: 103 QT Interval:  486 QTC Calculation: 452 R Axis:   99 Text Interpretation:  Sinus rhythm Right axis deviation Confirmed by   ZAMMIT  MD, JOSEPH ([id number]) on  12/04/2014 7:42:23 PM      MDM   Final diagnoses:  Stroke  Stroke  Stroke   81 yo with sudden LOC and aphasia.  Code Stroke called.  CT head already done. No acute distress, pt is alert with patent airway. Dr. Doy Mince at bedside.  Pt to receive TPA in the ED and will be admitted.  Filed Vitals:   12/04/14 1931 12/04/14 1945 12/04/14 2000 12/04/14 2015  BP: 178/69 177/54 157/55 162/56  Pulse: 59 49 54 56  Temp: 98.2 F (36.8 C)     TempSrc: Oral     Resp: 23 19 20 20   Height: 5\' 4"  (1.626 m)     Weight: 190 lb 11.2 oz (86.501 kg)     SpO2: 97% 94% 94% 97%   Meds given in ED:  Medications  alteplase (ACTIVASE) 1 mg/mL infusion 78 mg (78 mg Intravenous New Bag/Given 12/04/14 1951)   stroke: mapping our early stages of recovery book (not administered)  0.9 %  sodium chloride infusion (not administered)  acetaminophen (TYLENOL) tablet 650 mg (not administered)    Or  acetaminophen (TYLENOL) suppository 650 mg (not administered)  senna-docusate (Senokot-S) tablet 1 tablet (not administered)  labetalol (NORMODYNE,TRANDATE) injection 10 mg (not administered)  pantoprazole (PROTONIX) injection 40 mg (not administered)  insulin aspart (novoLOG) injection 0-15 Units (not administered)    New Prescriptions   No medications on file       Britt Bottom, NP 12/05/14 2197  Milton Ferguson, MD 12/05/14 2330

## 2014-12-04 NOTE — Progress Notes (Signed)
eLink Physician-Brief Progress Note Patient Name: [name] DOB: February 06, 1934 MRN: [id number]   Date of Service  12/04/2014  HPI/Events of Note  81 yo female admitted to ICU on 12/04/2014 with ischemic CVA. Now s/p tPA. Currently on 2 L/min [place] O2 with sat = 94% to 97% and RR = 20. BP = 173/51. Management per neurology.  eICU Interventions  Continue present management.      Intervention Category Evaluation Type: New Patient Evaluation  Lysle Dingwall 12/04/2014, 9:24 PM

## 2014-12-05 ENCOUNTER — Inpatient Hospital Stay (HOSPITAL_COMMUNITY): Payer: Medicare Other

## 2014-12-05 LAB — GLUCOSE, CAPILLARY
GLUCOSE-CAPILLARY: 160 mg/dL — AB (ref 70–99)
GLUCOSE-CAPILLARY: 125 mg/dL — AB (ref 70–99)
Glucose-Capillary: 250 mg/dL — ABNORMAL HIGH (ref 70–99)
Glucose-Capillary: 211 mg/dL — ABNORMAL HIGH (ref 70–99)

## 2014-12-05 LAB — LIPID PANEL
Cholesterol: 291 mg/dL — ABNORMAL HIGH (ref 0–200)
HDL: 28 mg/dL — ABNORMAL LOW (ref >39)
LDL Cholesterol: 211 mg/dL — ABNORMAL HIGH (ref 0–99)
TRIGLYCERIDES: 259 mg/dL — AB (ref <150)
Total CHOL/HDL Ratio: 10.4 RATIO
VLDL: 52 mg/dL — ABNORMAL HIGH (ref 0–40)

## 2014-12-05 LAB — MRSA PCR SCREENING: MRSA by PCR: NEGATIVE

## 2014-12-05 MED ORDER — TRAMADOL HCL 50 MG PO TABS
50.0000 mg | ORAL_TABLET | Freq: Once | ORAL | Status: AC
  Administered 2014-12-05: 50 mg via ORAL
  Filled 2014-12-05: qty 1

## 2014-12-05 MED ORDER — LABETALOL HCL 5 MG/ML IV SOLN
10.0000 mg | Freq: Once | INTRAVENOUS | Status: AC
  Administered 2014-12-05: 10 mg via INTRAVENOUS
  Filled 2014-12-05: qty 4

## 2014-12-05 MED ORDER — ASPIRIN EC 325 MG PO TBEC
325.0000 mg | DELAYED_RELEASE_TABLET | Freq: Every day | ORAL | Status: DC
  Administered 2014-12-05 – 2014-12-06 (×2): 325 mg via ORAL
  Filled 2014-12-05 (×2): qty 1

## 2014-12-05 MED ORDER — PANTOPRAZOLE SODIUM 40 MG PO TBEC
40.0000 mg | DELAYED_RELEASE_TABLET | Freq: Every day | ORAL | Status: DC
Start: 2014-12-05 — End: 2014-12-07
  Administered 2014-12-05 – 2014-12-07 (×3): 40 mg via ORAL
  Filled 2014-12-05 (×3): qty 1

## 2014-12-05 NOTE — Progress Notes (Addendum)
STROKE TEAM PROGRESS NOTE   HISTORY [name] is a 81 y.o. female with a history of atrial fibrillation who was riding with her family today when they noted acutely that she was unable to express herself.They did not note that she had any weakness. She was not complaining of SOB but was on O2. Patient was brought in for evaluation at that time. Initial NIHSS of 5.   Date last known well: Date: 12/04/2014 Time last known well: Time: 18:05 tPA Given: Yes Preadmission mRankin: 3   SUBJECTIVE (INTERVAL HISTORY) No family at bedside. She feels improved. No new neurologic deficits. Last night she couldn't talk at all, today she can express herself. Her speech slurring is worse when she is tired.    OBJECTIVE Temp:  [98 F (36.7 C)-98.6 F (37 C)] 98.6 F (37 C) (04/24 0348) Pulse Rate:  [37-68] 51 (04/24 0700) Cardiac Rhythm:  [-] Sinus bradycardia (04/23 2114) Resp:  [15-26] 21 (04/24 0700) BP: (124-201)/(43-138) 142/48 mmHg (04/24 0700) SpO2:  [92 %-98 %] 92 % (04/24 0700) Weight:  [81.7 kg (180 lb 1.9 oz)-86.501 kg (190 lb 11.2 oz)] 83.1 kg (183 lb 3.2 oz) (04/24 0500)   Recent Labs Lab 12/04/14 1926 12/04/14 2124  GLUCAP 144* 149*    Recent Labs Lab 12/04/14 1515  NA 139  K 3.8  CL 98  CO2 30  GLUCOSE 150*  BUN 33*  CREATININE 1.67*  CALCIUM 9.7    Recent Labs Lab 12/04/14 1515  AST 15  ALT 25  ALKPHOS 77  BILITOT 0.7  PROT 7.6  ALBUMIN 3.9    Recent Labs Lab 12/04/14 1515  WBC 7.5  NEUTROABS 5.2  HGB 13.8  HCT 43.0  MCV 94.7  PLT 221   No results for input(s): CKTOTAL, CKMB, CKMBINDEX, TROPONINI in the last 168 hours.  Recent Labs  12/04/14 1515  LABPROT 13.8  INR 1.04    Recent Labs  12/04/14 1956  COLORURINE YELLOW  LABSPEC 1.006  PHURINE 6.5  GLUCOSEU NEGATIVE  HGBUR NEGATIVE  BILIRUBINUR NEGATIVE  KETONESUR NEGATIVE  PROTEINUR 100*  UROBILINOGEN 0.2  NITRITE NEGATIVE  LEUKOCYTESUR NEGATIVE        Component Value Date/Time   CHOL 291* 12/05/2014 0234   TRIG 259* 12/05/2014 0234   HDL 28* 12/05/2014 0234   CHOLHDL 10.4 12/05/2014 0234   VLDL 52* 12/05/2014 0234   LDLCALC 211* 12/05/2014 0234   Lab Results  Component Value Date   HGBA1C 7.7* 06/07/2013      Component Value Date/Time   LABOPIA NONE DETECTED 12/04/2014 1956   COCAINSCRNUR NONE DETECTED 12/04/2014 1956   LABBENZ NONE DETECTED 12/04/2014 1956   AMPHETMU NONE DETECTED 12/04/2014 1956   THCU NONE DETECTED 12/04/2014 1956   LABBARB NONE DETECTED 12/04/2014 1956    No results for input(s): ETH in the last 168 hours.  Ct Head (brain) Wo Contrast 12/04/2014    No definite acute cortical infarction. Chronic changes as described.  Possible hyperattenuating LEFT middle cerebral artery M3 branch or branches could be indicative of distal emboli.      Mr Brain Wo Contrast 12/05/2014    MRI HEAD IMPRESSION:   1. No acute intracranial infarct or other abnormality identified.  2. Generalized cerebral atrophy with moderate chronic microvascular ischemic disease.    MRA HEAD IMPRESSION:   1. No proximal branch occlusion or hemodynamically significant stenosis identified within the intracranial circulation.  2. Nonvisualization of the left vertebral artery, which may be hypoplastic or occluded  in the neck. Dominant right vertebral artery widely patent.  3. Fetal origin of the right PCA.       PHYSICAL EXAM  PHYSICAL EXAM Physical exam: Exam: Gen: NAD Eyes: anicteric sclerae, moist conjunctivae                    CV: no MRG, no carotid bruits, no peripheral edema Mental Status: Alert,  follows commands, orientes to person, date, situation Lungs- chest clear, no wheezing, rales, normal symmetric air entry  Speech:    mild dysarthria  Cranial Nerves:    The pupils are equal, round, and reactive to light.. Attempted, Fundi not visualized.  EOMI. No gaze preference. Visual fields full. Face symmetric(no droop),  Tongue midline. Hearing intact to voice. bilat shoulder shrug intact.  Motor Observation:    no involuntary movements noted. Tone appears normal.     Strength:    Intact bilaterally, 5/5     Sensation:  Intact to LT  Plantars equivocal.      ASSESSMENT/PLAN Ms. [name] is a 81 y.o. female with history of atrial fibrillation, diabetes mellitus, obesity, lung nodule, obstructive sleep apnea, previous DVT, and pulmonary hypertension presenting with aphasia. She did receive IV t-PA 78 mg on Saturday, 12/04/2014 at 2000.  Stroke :   Resultant  dysarthria  MRI  - negative for acute infarct  MRA  - nonvisualization of the left vertebral artery,  Carotid Doppler - pending  2D Echo pending  LDL 211  HgbA1c pending  SCDs for VTE prophylaxis  Diet Carb Modified Fluid consistency:: Thin; Room service appropriate?: Yes  clopidogrel 75 mg orally every day prior to admission, now on no antithrombotic secondary to TPA therapy. Start ASA tonight after 24-hour CT scan.   Ongoing aggressive stroke risk factor management  Therapy recommendations: Pending  Disposition: Pending  Hypertension  Home meds:  Diltiazem and metoprolol  Stable - Permissive hypertension <220/120 for 24-48 hours and then gradually normalize within 5-7 days   Hyperlipidemia  Home meds: No lipid lowering medications prior to admission  LDL 211, goal < 70  Add Lipitor 40 mg daily  Continue statin at discharge  Diabetes  HgbA1c pending, goal < 7.0  Uncontrolled  Other Stroke Risk Factors  Advanced age  Cigarette smoker, quit smoking 36 years ago.  Obesity, Body mass index is 34.63 kg/(m^2).   Obstructive sleep apnea  Other Active Problems  Renal insufficiency BUN 33 creatinine 1.67  Other Pertinent History  The patient's brother has a history of a clotting disorder.  Personally examined patient and images, and have documentes history, physical, neuro exam,assessment  and plan as stated above.   Sarina Ill, MD Stroke Neurology [phone] [facility]        To contact Stroke Continuity provider, please refer to [email or web address] After hours, contact General Neurology

## 2014-12-05 NOTE — Progress Notes (Signed)
Pt's heart rhythm converted from sinus rhythm to Afib with HR noted to increase to low 100s-130s.  Pt does have a hx of Afib.  Pt also has c/o headache although prn acetaminophen was given earlier.  MD (Neuro-Reynolds) notified and orders placed.  Will continue to monitor.

## 2014-12-05 NOTE — Progress Notes (Signed)
Dr Jaynee Eagles notified of pt change from NIH 0 to a 1 with dysarthria. No new orders received will continue to monitor.

## 2014-12-05 NOTE — Progress Notes (Signed)
Utilization review completed.  

## 2014-12-05 NOTE — Progress Notes (Signed)
OT Cancellation Note  Patient Details Name: [name] MRN: [id number] DOB: 01/28/1934   Cancelled Treatment:    Reason Eval/Treat Not Completed: Other (comment) Pt with bedrest orders. Please D/C bedrest orders when pt appropriate for therapy. Thanks [place], OTR/L  [phone] 12/05/2014 12/05/2014, 5:08 PM

## 2014-12-06 DIAGNOSIS — E1159 Type 2 diabetes mellitus with other circulatory complications: Secondary | ICD-10-CM

## 2014-12-06 DIAGNOSIS — I481 Persistent atrial fibrillation: Secondary | ICD-10-CM

## 2014-12-06 DIAGNOSIS — G451 Carotid artery syndrome (hemispheric): Secondary | ICD-10-CM

## 2014-12-06 DIAGNOSIS — E785 Hyperlipidemia, unspecified: Secondary | ICD-10-CM

## 2014-12-06 DIAGNOSIS — I6789 Other cerebrovascular disease: Secondary | ICD-10-CM

## 2014-12-06 DIAGNOSIS — I25119 Atherosclerotic heart disease of native coronary artery with unspecified angina pectoris: Secondary | ICD-10-CM

## 2014-12-06 DIAGNOSIS — I10 Essential (primary) hypertension: Secondary | ICD-10-CM

## 2014-12-06 DIAGNOSIS — Z7901 Long term (current) use of anticoagulants: Secondary | ICD-10-CM

## 2014-12-06 LAB — GLUCOSE, CAPILLARY
Glucose-Capillary: 195 mg/dL — ABNORMAL HIGH (ref 70–99)
Glucose-Capillary: 207 mg/dL — ABNORMAL HIGH (ref 70–99)
Glucose-Capillary: 172 mg/dL — ABNORMAL HIGH (ref 70–99)

## 2014-12-06 LAB — BRAIN NATRIURETIC PEPTIDE: B Natriuretic Peptide: 1143.9 pg/mL — ABNORMAL HIGH (ref 0.0–100.0)

## 2014-12-06 LAB — BASIC METABOLIC PANEL
Anion gap: 13 (ref 5–15)
BUN: 24 mg/dL — AB (ref 6–23)
CALCIUM: 8.6 mg/dL (ref 8.4–10.5)
CHLORIDE: 101 mmol/L (ref 96–112)
CO2: 24 mmol/L (ref 19–32)
CREATININE: 1.47 mg/dL — AB (ref 0.50–1.10)
GFR calc Af Amer: 38 mL/min — ABNORMAL LOW (ref >90)
GFR calc non Af Amer: 33 mL/min — ABNORMAL LOW (ref >90)
GLUCOSE: 176 mg/dL — AB (ref 70–99)
Potassium: 3 mmol/L — ABNORMAL LOW (ref 3.5–5.1)
SODIUM: 138 mmol/L (ref 135–145)

## 2014-12-06 LAB — HEMOGLOBIN A1C
Hgb A1c MFr Bld: 7.3 % — ABNORMAL HIGH (ref 4.8–5.6)
Mean Plasma Glucose: 163 mg/dL

## 2014-12-06 MED ORDER — LEVOTHYROXINE SODIUM 25 MCG PO TABS: 25.0000 ug | ORAL_TABLET | ORAL | Status: DC

## 2014-12-06 MED ORDER — FUROSEMIDE 40 MG PO TABS
40.0000 mg | ORAL_TABLET | Freq: Two times a day (BID) | ORAL | Status: DC
  Administered 2014-12-06 – 2014-12-07 (×3): 40 mg via ORAL
  Filled 2014-12-06 (×4): qty 1

## 2014-12-06 MED ORDER — LEVOTHYROXINE SODIUM 50 MCG PO TABS
50.0000 ug | ORAL_TABLET | ORAL | Status: DC
  Administered 2014-12-07: 50 ug via ORAL
  Filled 2014-12-06 (×2): qty 1

## 2014-12-06 MED ORDER — DILTIAZEM HCL 25 MG/5ML IV SOLN
5.0000 mg | Freq: Once | INTRAVENOUS | Status: AC
  Administered 2014-12-06: 5 mg via INTRAVENOUS
  Filled 2014-12-06: qty 5

## 2014-12-06 MED ORDER — DILTIAZEM HCL ER COATED BEADS 180 MG PO CP24
360.0000 mg | ORAL_CAPSULE | Freq: Every day | ORAL | Status: DC
  Administered 2014-12-06 – 2014-12-07 (×2): 360 mg via ORAL
  Filled 2014-12-06 (×2): qty 1
  Filled 2014-12-06: qty 2

## 2014-12-06 MED ORDER — OXYBUTYNIN CHLORIDE ER 10 MG PO TB24
10.0000 mg | ORAL_TABLET | Freq: Every day | ORAL | Status: DC
  Administered 2014-12-06 – 2014-12-07 (×2): 10 mg via ORAL
  Filled 2014-12-06 (×2): qty 1

## 2014-12-06 MED ORDER — METOPROLOL TARTRATE 50 MG PO TABS
50.0000 mg | ORAL_TABLET | Freq: Two times a day (BID) | ORAL | Status: DC
  Administered 2014-12-06 – 2014-12-07 (×3): 50 mg via ORAL
  Filled 2014-12-06 (×3): qty 1

## 2014-12-06 MED ORDER — APIXABAN 5 MG PO TABS
5.0000 mg | ORAL_TABLET | Freq: Two times a day (BID) | ORAL | Status: DC
Start: 2014-12-06 — End: 2014-12-07
  Administered 2014-12-06 – 2014-12-07 (×3): 5 mg via ORAL
  Filled 2014-12-06 (×3): qty 1

## 2014-12-06 MED ORDER — ISOSORBIDE MONONITRATE ER 30 MG PO TB24
30.0000 mg | ORAL_TABLET | Freq: Every day | ORAL | Status: DC
  Administered 2014-12-06 – 2014-12-07 (×2): 30 mg via ORAL
  Filled 2014-12-06 (×2): qty 1

## 2014-12-06 MED ORDER — ASPIRIN EC 81 MG PO TBEC
81.0000 mg | DELAYED_RELEASE_TABLET | Freq: Every day | ORAL | Status: DC
  Administered 2014-12-07: 81 mg via ORAL
  Filled 2014-12-06: qty 1

## 2014-12-06 MED ORDER — INSULIN GLARGINE 100 UNIT/ML ~~LOC~~ SOLN
20.0000 [IU] | Freq: Every day | SUBCUTANEOUS | Status: DC
  Administered 2014-12-06 – 2014-12-07 (×2): 20 [IU] via SUBCUTANEOUS
  Filled 2014-12-06 (×2): qty 0.2

## 2014-12-06 MED ORDER — ALLOPURINOL 100 MG PO TABS
100.0000 mg | ORAL_TABLET | Freq: Every day | ORAL | Status: DC
  Administered 2014-12-06 – 2014-12-07 (×2): 100 mg via ORAL
  Filled 2014-12-06 (×2): qty 1

## 2014-12-06 MED ORDER — AZELASTINE HCL 0.1 % NA SOLN
1.0000 | Freq: Two times a day (BID) | NASAL | Status: DC
  Administered 2014-12-06 – 2014-12-07 (×3): 1 via NASAL
  Filled 2014-12-06: qty 30

## 2014-12-06 MED ORDER — LEVOTHYROXINE SODIUM 50 MCG PO TABS: 50.0000 ug | ORAL_TABLET | Freq: Every day | ORAL | Status: DC

## 2014-12-06 MED ORDER — BUDESONIDE 0.25 MG/2ML IN SUSP
0.2500 mg | Freq: Every day | RESPIRATORY_TRACT | Status: DC
  Administered 2014-12-06 – 2014-12-07 (×2): 0.25 mg via RESPIRATORY_TRACT
  Filled 2014-12-06 (×3): qty 2

## 2014-12-06 MED ORDER — POTASSIUM CHLORIDE 10 MEQ/100ML IV SOLN
10.0000 meq | INTRAVENOUS | Status: AC
Start: 2014-12-06 — End: 2014-12-06
  Administered 2014-12-06 (×2): 10 meq via INTRAVENOUS
  Filled 2014-12-06 (×2): qty 100

## 2014-12-06 NOTE — Progress Notes (Signed)
Pt transferred from 3M12 to 4N21 via bed.  Pt assisted to 4N bed without difficulty.  Pt oriented to room and receiving staff.

## 2014-12-06 NOTE — Progress Notes (Signed)
STROKE TEAM PROGRESS NOTE   SUBJECTIVE (INTERVAL HISTORY) No family at bedside. She feels improved. No new neurologic deficits. Last night she couldn't talk at all, today she can express herself. Her speech slurring is worse when she is tired.    OBJECTIVE Temp:  [97.8 F (36.6 C)-98.8 F (37.1 C)] 98.8 F (37.1 C) (04/25 1600) Pulse Rate:  [32-135] 92 (04/25 1700) Cardiac Rhythm:  [-] Atrial fibrillation (04/25 1600) Resp:  [10-33] 20 (04/25 1700) BP: (101-198)/(50-105) 125/103 mmHg (04/25 1700) SpO2:  [91 %-98 %] 91 % (04/25 1700) Weight:  [186 lb 11.7 oz (84.7 kg)] 186 lb 11.7 oz (84.7 kg) (04/25 0600)   Recent Labs Lab 12/05/14 1125 12/05/14 1742 12/05/14 2128 12/06/14 0736 12/06/14 1203  GLUCAP 160* 211* 125* 195* 207*    Recent Labs Lab 12/04/14 1515 12/06/14 0112  NA 139 138  K 3.8 3.0*  CL 98 101  CO2 30 24  GLUCOSE 150* 176*  BUN 33* 24*  CREATININE 1.67* 1.47*  CALCIUM 9.7 8.6    Recent Labs Lab 12/04/14 1515  AST 15  ALT 25  ALKPHOS 77  BILITOT 0.7  PROT 7.6  ALBUMIN 3.9    Recent Labs Lab 12/04/14 1515  WBC 7.5  NEUTROABS 5.2  HGB 13.8  HCT 43.0  MCV 94.7  PLT 221   No results for input(s): CKTOTAL, CKMB, CKMBINDEX, TROPONINI in the last 168 hours.  Recent Labs  12/04/14 1515  LABPROT 13.8  INR 1.04    Recent Labs  12/04/14 1956  COLORURINE YELLOW  LABSPEC 1.006  PHURINE 6.5  GLUCOSEU NEGATIVE  HGBUR NEGATIVE  BILIRUBINUR NEGATIVE  KETONESUR NEGATIVE  PROTEINUR 100*  UROBILINOGEN 0.2  NITRITE NEGATIVE  LEUKOCYTESUR NEGATIVE       Component Value Date/Time   CHOL 291* 12/05/2014 0234   TRIG 259* 12/05/2014 0234   HDL 28* 12/05/2014 0234   CHOLHDL 10.4 12/05/2014 0234   VLDL 52* 12/05/2014 0234   LDLCALC 211* 12/05/2014 0234   Lab Results  Component Value Date   HGBA1C 7.3* 12/05/2014      Component Value Date/Time   LABOPIA NONE DETECTED 12/04/2014 1956   COCAINSCRNUR NONE DETECTED 12/04/2014 1956    LABBENZ NONE DETECTED 12/04/2014 1956   AMPHETMU NONE DETECTED 12/04/2014 1956   THCU NONE DETECTED 12/04/2014 1956   LABBARB NONE DETECTED 12/04/2014 1956    No results for input(s): ETH in the last 168 hours.  Ct Head (brain) Wo Contrast 12/04/2014    No definite acute cortical infarction. Chronic changes as described.  Possible hyperattenuating LEFT middle cerebral artery M3 branch or branches could be indicative of distal emboli.    Mr Brain Wo Contrast 12/05/2014    MRI HEAD IMPRESSION:   1. No acute intracranial infarct or other abnormality identified.  2. Generalized cerebral atrophy with moderate chronic microvascular ischemic disease.    MRA HEAD IMPRESSION:   1. No proximal branch occlusion or hemodynamically significant stenosis identified within the intracranial circulation.  2. Nonvisualization of the left vertebral artery, which may be hypoplastic or occluded in the neck. Dominant right vertebral artery widely patent.  3. Fetal origin of the right PCA.     CUS - Bilateral 1-39% ICA stenosis, borderline >40%. Calcific plaque is present at bilateral origin and velocities may be underestimated. Bilateral ECA stenosis. Right vertebral patent with antegrade flow. Left vertebral is atypical with loss of diastolic component, suggesting a distal obstruction.  2D echo - - Normal LV function; moderate LVH; moderate  LAE; mild AI; mild TR.  PHYSICAL EXAM  Temp:  [97.8 F (36.6 C)-98.8 F (37.1 C)] 98.8 F (37.1 C) (04/25 1600) Pulse Rate:  [32-135] 92 (04/25 1700) Resp:  [10-33] 20 (04/25 1700) BP: (101-198)/(50-105) 125/103 mmHg (04/25 1700) SpO2:  [91 %-98 %] 91 % (04/25 1700) Weight:  [186 lb 11.7 oz (84.7 kg)] 186 lb 11.7 oz (84.7 kg) (04/25 0600)  General - Well nourished, well developed, in no apparent distress.  Ophthalmologic - Sharp disc margins OU.  Cardiovascular - irregularly irregular heart rate and rhythm with RVR.  Mental Status -  Level of arousal and  orientation to time, place, and person were intact. Language including expression, naming, repetition, comprehension was assessed and found intact.  Cranial Nerves II - XII - II - Visual field intact OU. III, IV, VI - Extraocular movements intact. V - Facial sensation intact bilaterally. VII - Facial movement intact bilaterally. VIII - Hearing & vestibular intact bilaterally. X - Palate elevates symmetrically. XI - Chin turning & shoulder shrug intact bilaterally. XII - Tongue protrusion intact.  Motor Strength - The patient's strength was normal in all extremities and pronator drift was absent.  Bulk was normal and fasciculations were absent.   Motor Tone - Muscle tone was assessed at the neck and appendages and was normal.  Reflexes - The patient's reflexes were 1+ in all extremities and she had no pathological reflexes.  Sensory - Light touch, temperature/pinprick, vibration and proprioception, and Romberg testing were assessed and were symmetrical.    Coordination - The patient had normal movements in the hands and feet with no ataxia or dysmetria.  Tremor was absent.  Gait and Station - deferred due to safety concerns.  ASSESSMENT/PLAN Ms. [name] is a 81 y.o. female with history of afib not on anticoagulation, DM, obesity, OSA, previous DVT, and pulmonary hypertension presenting with aphasia. She did receive IV t-PA on Saturday. Symptoms now resolved.  TIA - left MCA territory, likely secondary to A. fib not on anticoagulation   MRI  - negative for acute infarct  MRA  - nonvisualization of the left vertebral artery,  Carotid Doppler - unremarkable  2D Echo unremarkable  LDL 211, not at goal  HgbA1c 7.3, not at goal  Eliquis for VTE prophylaxis Diet Carb Modified Fluid consistency:: Thin; Room service appropriate?: Yes  clopidogrel 75 mg orally every day prior to admission, now on Eliquis and baby aspirin. Continue anticoagulation on  discharge.  Ongoing aggressive stroke risk factor management  Therapy recommendations: Pending  Disposition: Transfer to floor  Atrial fibrillation  Not on antipronation PTA  Put on Eliquis 500 one twice a day  Really not in good control  Resume home medication Cardizem and metoprolol  Telemetry monitoring  Hypertension  Home meds:  Diltiazem and metoprolol Stable - Permissive hypertension <220/120 for 24-48 hours and then gradually normalize within 5-7 days Home has resumed Stable  Hyperlipidemia  Home meds: No lipid lowering medications prior to admission  LDL 211, goal < 70  Patient allergy to statin, she need outpatient follow-up with PCP to consider PCSK9 inhibitors.  Continue statin at discharge  Diabetes  HgbA1c 7.3, goal < 7.0  On Lantus  Uncontrolled  SSI  CBG monitoring  Other Stroke Risk Factors  Advanced age  Cigarette smoker, quit smoking 36 years ago.  Obesity, Body mass index is 35.3 kg/(m^2).   Obstructive sleep apnea  Other Active Problems  Renal insufficiency BUN 33 creatinine 1.67  Other Pertinent History  The patient's brother has a history of a clotting disorder.   Rosalin Hawking, MD PhD Stroke Neurology 12/06/2014 6:18 PM    To contact Stroke Continuity provider, please refer to [email or web address] After hours, contact General Neurology

## 2014-12-06 NOTE — Evaluation (Signed)
Speech Language Pathology Evaluation Patient Details Name: [name] MRN: [id number] DOB: [birth year] Today's Date: 12/06/2014 Time: 6295-2841 SLP Time Calculation (min) (ACUTE ONLY): 11 min  Problem List:  Patient Active Problem List   Diagnosis Date Noted  . Stroke 12/04/2014  . Chronic diastolic heart failure 32/44/0102  . Coronary atherosclerosis of native coronary artery 12/09/2013  . CHF (congestive heart failure) 09/09/2013  . Acute on chronic diastolic congestive heart failure 09/09/2013  . Pneumonitis 08/28/2013  . Other nonspecific abnormal serum enzyme levels 08/02/2013  . Osteoporosis 07/29/2013  . Constipation 07/29/2013  . Overactive bladder 07/29/2013  . Anxiety 07/29/2013  . Type II or unspecified type diabetes mellitus with renal manifestations, not stated as uncontrolled 07/22/2013  . Secondary renovascular hypertension, benign 07/22/2013  . Acute and chronic respiratory failure with hypoxia 07/16/2013  . Hyperkalemia 07/16/2013  . HCAP (healthcare-associated pneumonia) 07/15/2013  . Protein-calorie malnutrition, severe 07/10/2013  . Renal failure 07/09/2013  . Acute renal failure 07/09/2013  . Metabolic acidosis 72/53/6644  . Nausea vomiting and diarrhea 07/09/2013  . Secondary pulmonary hypertension 06/09/2013  . Hyposmolality and/or hyponatremia 06/09/2013  . Acute on chronic diastolic heart failure 03/47/4259  . CAP (community acquired pneumonia) 06/08/2013  . COPD with acute exacerbation 06/07/2013  . CHF (congestive heart failure), NYHA class II 06/07/2013  . Acute diastolic heart failure   . Allergic rhinitis due to pollen 01/11/2011  . Obstructive sleep apnea 04/09/2010  . HYPOTHYROIDISM 02/09/2010  . HYPERTENSION 02/09/2010  . Coronary atherosclerosis 02/09/2010  . Atrial fibrillation 02/09/2010  . BRADYCARDIA 02/09/2010  . CHF 02/09/2010  . RENAL ARTERY STENOSIS 02/09/2010  . HYPOTENSION 02/09/2010  . COPD 02/09/2010  .  GENERALIZED OSTEOARTHROSIS UNSPECIFIED SITE 02/09/2010  . OBESITY HYPOVENTILATION SYNDROME 12/01/2009  . PRURITUS 07/29/2009  . Tracheobronchitis 09/28/2008  . RHINOSINUSITIS, ACUTE 11/07/2007  . LUNG NODULE 11/07/2007  . ANEMIA 08/25/2007  . Hypoxemia 08/25/2007  . DM 08/19/2007  . ENDOGENOUS OBESITY 08/19/2007  . PERIPHERAL EDEMA 08/19/2007  . ASTHMA, INTRINSIC, WITH ACUTE EXACERBATION 08/15/2007  . ACID REFLUX DISEASE 08/15/2007  . DYSPNEA 08/15/2007   Past Medical History:  Past Medical History  Diagnosis Date  . CAD (coronary artery disease)   . CHF (congestive heart failure)   . Paroxysmal atrial fibrillation   . Bradycardia   . Hypertension   . Renal artery stenosis   . COPD (chronic obstructive pulmonary disease)   . Obesity hypoventilation syndrome   . Pruritus   . Tracheobronchitis   . Acute rhinosinusitis   . Anemia   . Hypoxemia   . Peripheral edema   . Obesity, endogenous   . Diabetes mellitus   . Acid reflux disease   . Osteoarthritis, generalized   . Hypothyroidism   . Lung nodule     lingula  . OSA (obstructive sleep apnea)     NPSG 04/03/00--AHI 28.hr  . DVT (deep venous thrombosis) 2003    left leg  . Vaginal atrophy   . Hx of colonic polyps   . Acute diastolic heart failure   . Secondary pulmonary hypertension 06/09/2013  . Blood transfusion without reported diagnosis 2009    had internal bleeding  . Cancer 1959    ovarian cancer  . Osteoporosis    Past Surgical History:  Past Surgical History  Procedure Laterality Date  . Lumbar spine surgery    . Cholecystectomy    . Hemorroidectomy    . Repair wound fistula    . Right shoulder    .  Elbow surgery    . Dilation and curettage of uterus    . Breast surgery  1998    lumpectomy  . Oophorectomy  1959    BSO-? ovarian cancer  . Bilateral salpingoophorectomy  1959    -d/t ovarian cancer  . Rotator cuff repair Right    HPI:  81 y.o. female with a history of atrial fibrillation, COPD,  GERD, tracheobhronchitis, HTN, ovarian cancer, lung nodule who was riding with her family today when they noted acutely that she was unable to express herself. They did not note that she had any weakness. She was not complaining of SOB but was on O2. Patient was brought in for evaluation at that time. Initial NIHSS of 5.MRI no acute intracranial infarct or other abnormality identified.    Assessment / Plan / Recommendation Clinical Impression  Cognitive abilities were within functional limits during assessment. Pt nor daughter noticed cognitive challenges. She exhibits moderate dysarthria characterized by phonemic distortions and decreased labial/lingual ROM during speech. Pt would benefit from continued ST in hospital and home health upon discharge.    SLP Assessment  Patient needs continued Speech Lanaguage Pathology Services    Follow Up Recommendations  Home health SLP    Frequency and Duration min 2x/week  1 week   Pertinent Vitals/Pain Pain Assessment: No/denies pain   SLP Goals  Potential to Achieve Goals (ACUTE ONLY): Good  SLP Evaluation Prior Functioning  Cognitive/Linguistic Baseline: Within functional limits Type of Home: House  Lives With: Spouse;Daughter Available Help at Discharge: Family;Available 24 hours/day   Cognition  Overall Cognitive Status: Within Functional Limits for tasks assessed Orientation Level: Oriented X4    Comprehension  Auditory Comprehension Overall Auditory Comprehension: Appears within functional limits for tasks assessed Visual Recognition/Discrimination Discrimination: Not tested Reading Comprehension Reading Status: Not tested    Expression Verbal Expression Overall Verbal Expression: Appears within functional limits for tasks assessed Written Expression Dominant Hand: Right Written Expression: Not tested   Oral / Motor Oral Motor/Sensory Function Overall Oral Motor/Sensory Function: Appears within functional limits for tasks  assessed Motor Speech Overall Motor Speech: Impaired Respiration: Within functional limits Phonation: Normal Resonance: Within functional limits Articulation: Within functional limitis Intelligibility: Intelligibility reduced Word: 75-100% accurate Phrase: 75-100% accurate Sentence: 75-100% accurate Conversation: 75-100% accurate Motor Planning: Witnin functional limits   GO     Houston Siren 12/06/2014, 3:01 PM   Orbie Pyo Dequita Schleicher M.Ed [employer] [phone]

## 2014-12-06 NOTE — Evaluation (Signed)
Occupational Therapy Evaluation Patient Details Name: [name] MRN: [id number] DOB: [birth year] Today's Date: 12/06/2014    History of Present Illness Pt is a 81 yo female admitted w onsidet of expressive aphasia and SOB while in car with family.  Pt now dysarthric.  Pt did receive TPA.  MRI negative at this time.    Clinical Impression   Pt admitted with the above diagnosis and has minor deficits listed below. Pt would benefit from a short run of OT to ensure she is at mod I level of care with adls to return home with her husband.    Follow Up Recommendations  No OT follow up;Supervision - Intermittent    Equipment Recommendations  None recommended by OT    Recommendations for Other Services       Precautions / Restrictions Restrictions Weight Bearing Restrictions: No      Mobility Bed Mobility Overal bed mobility: Modified Independent             General bed mobility comments: pt safe with no physical assist needed. HOB at 20 degrees.  Transfers Overall transfer level: Needs assistance Equipment used: Rolling walker (2 wheeled) Transfers: Sit to/from [employer] Sit to Stand: Supervision Stand pivot transfers: Min guard       General transfer comment: Pt with multiple lines.    Balance Overall balance assessment: Needs assistance Sitting-balance support: Feet supported Sitting balance-Leahy Scale: Good     Standing balance support: Bilateral upper extremity supported;During functional activity Standing balance-Leahy Scale: Fair Standing balance comment: pt could stand w/o support for short spurts of time. Further eval deferred due to high HR (afib)                            ADL Overall ADL's : Needs assistance/impaired Eating/Feeding: Set up;Sitting   Grooming: Wash/dry hands;Wash/dry face;Set up;Sitting   Upper Body Bathing: Set up;Sitting   Lower Body Bathing: Minimal assistance;Sit to/from stand Lower  Body Bathing Details (indicate cue type and reason): min assist to reach feet and to stand to wash bottom.  Pt limited by high HR due to afib. Upper Body Dressing : Minimal assistance;Sitting Upper Body Dressing Details (indicate cue type and reason): min assist only for lines Lower Body Dressing: Minimal assistance;Sit to/from stand Lower Body Dressing Details (indicate cue type and reason): min assist to donn socks and shoes.  pt with high HR and HR when up as pt was taxed and asked to reach toward feet. Toilet Transfer: Min Public librarian Details (indicate cue type and reason): Pt required min guard for lines and b/c it was pts first time up. Toileting- Water quality scientist and Hygiene: Min guard;Sit to/from stand Toileting - Clothing Manipulation Details (indicate cue type and reason): min guard for balance.     Functional mobility during ADLs: Min guard;Rolling walker General ADL Comments: pt does well with adls and probably is very close to baseline.  Limited with eval due to afib and high HR.  Feel pt may only need one or two more sessions to ensure she is close to or at baseline.     Vision Vision Assessment?: No apparent visual deficits   Perception Perception Perception Tested?: No   Praxis      Pertinent Vitals/Pain Pain Assessment: No/denies pain     Hand Dominance Right   Extremity/Trunk Assessment Upper Extremity Assessment Upper Extremity Assessment: Overall WFL for tasks assessed   Lower Extremity Assessment Lower Extremity  Assessment: Defer to PT evaluation   Cervical / Trunk Assessment Cervical / Trunk Assessment: Normal   Communication Communication Communication: Other (comment) (dysarthric)   Cognition Arousal/Alertness: Awake/alert Behavior During Therapy: WFL for tasks assessed/performed Overall Cognitive Status: Within Functional Limits for tasks assessed                     General Comments       Exercises        Shoulder Instructions      Home Living Family/patient expects to be discharged to:: Private residence Living Arrangements: Spouse/significant other Available Help at Discharge: Family;Available 24 hours/day Type of Home: House Home Access: Stairs to enter [employer] of Steps: 2 small steps Entrance Stairs-Rails: None Home Layout: Multi-level Alternate Level Stairs-Number of Steps: pt has lift to alternate levels.   Bathroom Shower/Tub: Gaffer;Door   [employer] Toilet: Standard Bathroom Accessibility: Yes How Accessible: Accessible via walker Home Equipment: Walker - 2 wheels;Walker - 4 wheels;Shower seat (O2)   Additional Comments: Pt has vanity next to toilet to push up from       Prior Functioning/Environment Level of Independence: Independent with assistive device(s)        Comments: walker inside and rollator outside.    OT Diagnosis: Generalized weakness   OT Problem List: Impaired balance (sitting and/or standing);Decreased knowledge of use of DME or AE   OT Treatment/Interventions: Self-care/ADL training;Therapeutic activities    OT Goals(Current goals can be found in the care plan section) Acute Rehab OT Goals Patient Stated Goal: to go home. OT Goal Formulation: With patient/family Time For Goal Achievement: 12/13/14 Potential to Achieve Goals: Good ADL Goals Pt Will Perform Grooming: with modified independence;standing Pt Will Perform Tub/Shower Transfer: Shower transfer;shower seat;ambulating;rolling walker Additional ADL Goal #1: Pt will gather clothes and dress with mod I (use of rolling walker). Additional ADL Goal #2: Pt will toilet on standard commode with mod I. (use of walker)  OT Frequency: Min 2X/week   Barriers to D/C:            Co-evaluation PT/OT/SLP Co-Evaluation/Treatment: Yes Reason for Co-Treatment: Necessary to address cognition/behavior during functional activity   OT goals addressed during session: ADL's  and self-care      End of Session Equipment Utilized During Treatment: Rolling walker;Oxygen Nurse Communication: Mobility status  Activity Tolerance: Patient tolerated treatment well Patient left: in chair;with call bell/phone within reach;Other (comment) (w ST)   Time: 7322-0254 OT Time Calculation (min): 24 min Charges:  OT General Charges $OT Visit: 1 Procedure OT Evaluation $Initial OT Evaluation Tier I: 1 Procedure G-Codes:    Glenford Peers Jan 01, 2015, 9:52 AM  [phone]

## 2014-12-06 NOTE — Progress Notes (Signed)
Pt remains in Afib with HR generally 100-120s but occasionally HR will increase very briefly to 140.  MD (Neuro-Reynolds) notified and orders placed.  Will continue to monitor.

## 2014-12-06 NOTE — Progress Notes (Signed)
Echocardiogram 2D Echocardiogram has been performed.  Joelene Millin 12/06/2014, 11:28 AM

## 2014-12-06 NOTE — Evaluation (Addendum)
Clinical/Bedside Swallow Evaluation Patient Details  Name: [name] MRN: [id number] Date of Birth: Jul 10, 1934  Today's Date: 12/06/2014 Time: SLP Start Time (ACUTE ONLY): 44 SLP Stop Time (ACUTE ONLY): 0928 SLP Time Calculation (min) (ACUTE ONLY): 13 min  Past Medical History:  Past Medical History  Diagnosis Date  . CAD (coronary artery disease)   . CHF (congestive heart failure)   . Paroxysmal atrial fibrillation   . Bradycardia   . Hypertension   . Renal artery stenosis   . COPD (chronic obstructive pulmonary disease)   . Obesity hypoventilation syndrome   . Pruritus   . Tracheobronchitis   . Acute rhinosinusitis   . Anemia   . Hypoxemia   . Peripheral edema   . Obesity, endogenous   . Diabetes mellitus   . Acid reflux disease   . Osteoarthritis, generalized   . Hypothyroidism   . Lung nodule     lingula  . OSA (obstructive sleep apnea)     NPSG 04/03/00--AHI 28.hr  . DVT (deep venous thrombosis) 2003    left leg  . Vaginal atrophy   . Hx of colonic polyps   . Acute diastolic heart failure   . Secondary pulmonary hypertension 06/09/2013  . Blood transfusion without reported diagnosis 2009    had internal bleeding  . Cancer 1959    ovarian cancer  . Osteoporosis    Past Surgical History:  Past Surgical History  Procedure Laterality Date  . Lumbar spine surgery    . Cholecystectomy    . Hemorroidectomy    . Repair wound fistula    . Right shoulder    . Elbow surgery    . Dilation and curettage of uterus    . Breast surgery  1998    lumpectomy  . Oophorectomy  1959    BSO-? ovarian cancer  . Bilateral salpingoophorectomy  1959    -d/t ovarian cancer  . Rotator cuff repair Right    HPI:  81 y.o. female with a history of atrial fibrillation, COPD, GERD, tracheobhronchitis, HTN, ovarian cancer, lung nodule who was riding with her family today when they noted acutely that she was unable to express herself. They did not note that she had any  weakness. She was not complaining of SOB but was on O2. Patient was brought in for evaluation at that time. Initial NIHSS of 5.MRI no acute intracranial infarct or other abnormality identified. Pt passed RN swallow screen but observed coughing during breakfast meal during end of  PT/OT and ST swallow order received.   Assessment / Plan / Recommendation Clinical Impression  Pt exhibited mild-moderate oral dysphagia indicated by prolonged mastication, transit and appeared to pocket. Family and pt both report pt's prolonged mastication is baseline for her and that "I always take a long time to eat." No direct s/s aspiration exhibited. Aspiration risk is increased given current dysarthria and history of COPD. SLP provided clinical reasoning/explanation for COPD and potential for dysphagia. Continue regular, thin, meds whole in applesauce, check right side oral cavity for pocketed food.    Aspiration Risk  Moderate    Diet Recommendation Regular;Thin liquid   Liquid Administration via: Straw;Cup Medication Administration: Whole meds with puree Supervision: Patient able to self feed Compensations: Slow rate;Small sips/bites;Check for pocketing Postural Changes and/or Swallow Maneuvers: Seated upright 90 degrees;Upright 30-60 min after meal    Other  Recommendations Oral Care Recommendations: Oral care BID   Follow Up Recommendations  None    Frequency and Duration min  2x/week  1 week   Pertinent Vitals/Pain none         Swallow Study           Oral/Motor/Sensory Function Overall Oral Motor/Sensory Function: Appears within functional limits for tasks assessed   Ice Chips Ice chips: Not tested   Thin Liquid Thin Liquid: Within functional limits Presentation: Straw    Nectar Thick Nectar Thick Liquid: Not tested   Honey Thick Honey Thick Liquid: Not tested   Puree Puree: Not tested   Solid   GO    Solid: Impaired Oral Phase Impairments: Impaired mastication Oral Phase  Functional Implications: Right lateral sulci pocketing (prolonged mastication and transit) Pharyngeal Phase Impairments: Throat Clearing - Delayed       [name] [name] 12/06/2014,2:53 PM  [name] [place].Ed [employer] [phone]

## 2014-12-06 NOTE — Evaluation (Signed)
Physical Therapy Evaluation Patient Details Name: [name] MRN: [id number] DOB: [birth year] Today's Date: 12/06/2014   History of Present Illness  Pt is a 81 yo female admitted w onsidet of expressive aphasia and SOB while in car with family.  Pt now dysarthric.  Pt did receive TPA.  MRI negative at this time.   Clinical Impression  Pt moving well, just limited today by A-fib to 150's non-sustained during mobility.  Feel with HR better controlled pt will make great progress with mobility to return to home with family.  Will continue to follow.      Follow Up Recommendations Home health PT;Supervision/Assistance - 24 hour    Equipment Recommendations  None recommended by PT    Recommendations for Other Services       Precautions / Restrictions Precautions Precautions: Fall Restrictions Weight Bearing Restrictions: No      Mobility  Bed Mobility Overal bed mobility: Modified Independent             General bed mobility comments: pt safe with no physical assist needed. HOB at 20 degrees.  Transfers Overall transfer level: Needs assistance Equipment used: Rolling walker (2 wheeled) Transfers: Sit to/from [facility] Sit to Stand: Supervision Stand pivot transfers: Min guard       General transfer comment: pt with A-fib and HR nonsustained in the 150's during mobility.  RN aware.    Ambulation/Gait                Stairs            Wheelchair Mobility    Modified Rankin (Stroke Patients Only)       Balance Overall balance assessment: Needs assistance Sitting-balance support: Feet supported;No upper extremity supported Sitting balance-Leahy Scale: Good     Standing balance support: Bilateral upper extremity supported Standing balance-Leahy Scale: Fair Standing balance comment: pt able to stand for brief period of time without UE support, but needs RW for dynamic balance.                                Pertinent Vitals/Pain Pain Assessment: No/denies pain    Home Living Family/patient expects to be discharged to:: Private residence Living Arrangements: Spouse/significant other Available Help at Discharge: Family;Available 24 hours/day Type of Home: House Home Access: Stairs to enter Entrance Stairs-Rails: None Entrance Stairs-Number of Steps: 2 small steps Home Layout: Multi-level Home Equipment: Walker - 2 wheels;Walker - 4 wheels;Shower seat (O2) Additional Comments: Pt has vanity next to toilet to push up from     Prior Function Level of Independence: Independent with assistive device(s)         Comments: walker inside and rollator outside.     Hand Dominance   Dominant Hand: Right    Extremity/Trunk Assessment   Upper Extremity Assessment: Defer to OT evaluation           Lower Extremity Assessment: Overall WFL for tasks assessed      Cervical / Trunk Assessment: Normal  Communication   Communication: Other (comment) (dysarthric)  Cognition Arousal/Alertness: Awake/alert Behavior During Therapy: WFL for tasks assessed/performed Overall Cognitive Status: Within Functional Limits for tasks assessed                      General Comments General comments (skin integrity, edema, etc.): Pt seems most affected in areas of swallowing and speech.    Exercises  Assessment/Plan    PT Assessment Patient needs continued PT services  PT Diagnosis Difficulty walking   PT Problem List Decreased activity tolerance;Decreased balance;Decreased mobility;Decreased knowledge of use of DME;Cardiopulmonary status limiting activity  PT Treatment Interventions DME instruction;Gait training;Stair training;Functional mobility training;Therapeutic activities;Therapeutic exercise;Balance training;Neuromuscular re-education;Patient/family education   PT Goals (Current goals can be found in the Care Plan section) Acute Rehab PT Goals Patient Stated Goal: to  go home. PT Goal Formulation: With patient Time For Goal Achievement: 12/13/14 Potential to Achieve Goals: Good    Frequency Min 4X/week   Barriers to discharge        Co-evaluation PT/OT/SLP Co-Evaluation/Treatment: Yes Reason for Co-Treatment: For patient/therapist safety PT goals addressed during session: Mobility/safety with mobility;Balance;Proper use of DME OT goals addressed during session: ADL's and self-care       End of Session Equipment Utilized During Treatment: Oxygen Activity Tolerance: Treatment limited secondary to medical complications (Comment) (Limited 2/2 A-fib to 150's.  ) Patient left: in chair;with call bell/phone within reach;with family/visitor present Nurse Communication: Mobility status         Time: 1093-2355 PT Time Calculation (min) (ACUTE ONLY): 17 min   Charges:   PT Evaluation $Initial PT Evaluation Tier I: 1 Procedure     PT G CodesCatarina Hartshorn, Marine 12/06/2014, 10:09 AM

## 2014-12-06 NOTE — Progress Notes (Signed)
Pt transferred to unit via RN x 1. Pt alert and oriented upon arrival. No complaints of pain or discomfort. No signs or symptoms of acute distress. Pt connected to telemetry and central monitoring notified. Pt oriented to unit as well as unit procedures. Pt now resting in bed at lowest position, bed alarm on, call light in reach. Will continue to monitor. Fortino Sic, RN, BSN 12/06/2014 9:22 PM

## 2014-12-06 NOTE — Progress Notes (Signed)
*  PRELIMINARY RESULTS* Vascular Ultrasound Carotid Duplex (Doppler) has been completed.  Preliminary findings: Bilateral 1-39% ICA stenosis, borderline >40%. Calcific plaque is present at bilateral origin and velocities may be underestimated.  Bilateral ECA stenosis.  Right vertebral patent with antegrade flow. Left vertebral is atypical with loss of diastolic component, suggesting a distal obstruction.  Landry Mellow, RDMS, RVT  12/06/2014, 12:33 PM

## 2014-12-06 NOTE — Progress Notes (Signed)
Pt's potassium level is 3.0.  MD (Neuro-Reynolds) notified and order placed.  Will continue to monitor.

## 2014-12-07 DIAGNOSIS — E669 Obesity, unspecified: Secondary | ICD-10-CM

## 2014-12-07 DIAGNOSIS — Z720 Tobacco use: Secondary | ICD-10-CM

## 2014-12-07 DIAGNOSIS — E1165 Type 2 diabetes mellitus with hyperglycemia: Secondary | ICD-10-CM

## 2014-12-07 DIAGNOSIS — E785 Hyperlipidemia, unspecified: Secondary | ICD-10-CM | POA: Diagnosis present

## 2014-12-07 DIAGNOSIS — F17210 Nicotine dependence, cigarettes, uncomplicated: Secondary | ICD-10-CM | POA: Diagnosis present

## 2014-12-07 DIAGNOSIS — I4891 Unspecified atrial fibrillation: Secondary | ICD-10-CM

## 2014-12-07 LAB — CBC
HCT: 41.8 % (ref 36.0–46.0)
Hemoglobin: 13 g/dL (ref 12.0–15.0)
MCH: 30.2 pg (ref 26.0–34.0)
MCHC: 31.1 g/dL (ref 30.0–36.0)
MCV: 97 fL (ref 78.0–100.0)
PLATELETS: 194 10*3/uL (ref 150–400)
RBC: 4.31 MIL/uL (ref 3.87–5.11)
RDW: 15.3 % (ref 11.5–15.5)
WBC: 10.3 10*3/uL (ref 4.0–10.5)

## 2014-12-07 LAB — BASIC METABOLIC PANEL
ANION GAP: 11 (ref 5–15)
BUN: 27 mg/dL — ABNORMAL HIGH (ref 6–23)
CO2: 27 mmol/L (ref 19–32)
Calcium: 8.8 mg/dL (ref 8.4–10.5)
Chloride: 103 mmol/L (ref 96–112)
Creatinine, Ser: 1.69 mg/dL — ABNORMAL HIGH (ref 0.50–1.10)
GFR calc non Af Amer: 28 mL/min — ABNORMAL LOW (ref >90)
GFR, EST AFRICAN AMERICAN: 32 mL/min — AB (ref >90)
Glucose, Bld: 211 mg/dL — ABNORMAL HIGH (ref 70–99)
Potassium: 4 mmol/L (ref 3.5–5.1)
SODIUM: 141 mmol/L (ref 135–145)

## 2014-12-07 LAB — GLUCOSE, CAPILLARY
Glucose-Capillary: 193 mg/dL — ABNORMAL HIGH (ref 70–99)
Glucose-Capillary: 197 mg/dL — ABNORMAL HIGH (ref 70–99)

## 2014-12-07 MED ORDER — APIXABAN 5 MG PO TABS: 5.0000 mg | ORAL_TABLET | Freq: Two times a day (BID) | ORAL | Status: DC

## 2014-12-07 MED ORDER — DILTIAZEM HCL ER COATED BEADS 360 MG PO CP24: 360.0000 mg | ORAL_CAPSULE | Freq: Two times a day (BID) | ORAL | Status: DC

## 2014-12-07 NOTE — Discharge Summary (Signed)
Stroke Discharge Summary  Patient ID: [name]   MRN: [id number]      DOB: 1934/02/14  Date of Admission: 12/04/2014 Date of Discharge: 12/07/2014  Attending Physician:  Rosalin Hawking, MD, Stroke MD  Consulting Physician(s):     None  Patient's PCP:  Cari Caraway, MD  DISCHARGE DIAGNOSIS:  Principal Problem:   TIA (transient ischemic attack) - s/p IV tPA for presumed stroke on admission Active Problems:   Diabetes type 2, uncontrolled   Obstructive sleep apnea   Essential hypertension   Atrial fibrillation with RVR   Renal insufficiency   Obesity   Hyperlipidemia   Cigarette smoker  BMI: Body mass index is 33.88 kg/(m^2).  Past Medical History  Diagnosis Date  . CAD (coronary artery disease)   . CHF (congestive heart failure)   . Paroxysmal atrial fibrillation   . Bradycardia   . Hypertension   . Renal artery stenosis   . COPD (chronic obstructive pulmonary disease)   . Obesity hypoventilation syndrome   . Pruritus   . Tracheobronchitis   . Acute rhinosinusitis   . Anemia   . Hypoxemia   . Peripheral edema   . Obesity, endogenous   . Diabetes mellitus   . Acid reflux disease   . Osteoarthritis, generalized   . Hypothyroidism   . Lung nodule     lingula  . OSA (obstructive sleep apnea)     NPSG 04/03/00--AHI 28.hr  . DVT (deep venous thrombosis) 2003    left leg  . Vaginal atrophy   . Hx of colonic polyps   . Acute diastolic heart failure   . Secondary pulmonary hypertension 06/09/2013  . Blood transfusion without reported diagnosis 2009    had internal bleeding  . Cancer 1959    ovarian cancer  . Osteoporosis    Past Surgical History  Procedure Laterality Date  . Lumbar spine surgery    . Cholecystectomy    . Hemorroidectomy    . Repair wound fistula    . Right shoulder    . Elbow surgery    . Dilation and curettage of uterus    . Breast surgery  1998    lumpectomy  . Oophorectomy  1959    BSO-? ovarian cancer  . Bilateral  salpingoophorectomy  1959    -d/t ovarian cancer  . Rotator cuff repair Right       Medication List    STOP taking these medications        acetaminophen 325 MG tablet  Commonly known as:  TYLENOL     clopidogrel 75 MG tablet  Commonly known as:  PLAVIX      TAKE these medications        albuterol 108 (90 BASE) MCG/ACT inhaler  Commonly known as:  PROAIR HFA  1-2 puffs QID prn     allopurinol 100 MG tablet  Commonly known as:  ZYLOPRIM  Take 1 tablet (100 mg total) by mouth daily.     apixaban 5 MG Tabs tablet  Commonly known as:  ELIQUIS  Take 1 tablet (5 mg total) by mouth 2 (two) times daily.     azelastine 0.1 % nasal spray  Commonly known as:  ASTELIN  1-2 sprays each nostril once or twice daily     budesonide 0.25 MG/2ML nebulizer solution  Commonly known as:  PULMICORT  Take 2 mLs (0.25 mg total) by nebulization daily.     colchicine 0.6 MG tablet  Take 1.2 mg  by mouth daily as needed. Takes as needed for gout attacks.     diltiazem 360 MG 24 hr capsule  Commonly known as:  CARDIZEM CD  Take 1 capsule (360 mg total) by mouth 2 (two) times daily.     DITROPAN XL 10 MG 24 hr tablet  Generic drug:  oxybutynin  Take 10 mg by mouth daily.     furosemide 40 MG tablet  Commonly known as:  LASIX  Take 1 tablet (40 mg total) by mouth 2 (two) times daily. Contact cardiology office or PCP if you experienced > 3 pounds overnight or > 5 pounds in 1 week for further instructions     insulin glargine 100 UNIT/ML injection  Commonly known as:  LANTUS  Inject 0.2 mLs (20 Units total) into the skin daily.     insulin lispro 100 UNIT/ML injection  Commonly known as:  HUMALOG  Inject into the skin 3 (three) times daily before meals. Per sliding scale. Patient unsure about how much she really takes     isosorbide mononitrate 30 MG 24 hr tablet  Commonly known as:  IMDUR  1 tablet by mouth daily     levothyroxine 50 MCG tablet  Commonly known as:  SYNTHROID,  LEVOTHROID  Take 50 mcg by mouth daily before breakfast. Take 50mg  on Tues, Thurs, Sat and sundays     levothyroxine 25 MCG tablet  Commonly known as:  SYNTHROID, LEVOTHROID  Take 25 mcg by mouth See admin instructions. Take 1 tablet (25 mcg) on Monday, Wednesday, Friday     metoprolol 50 MG tablet  Commonly known as:  LOPRESSOR  1 tablet by mouth twice a day     nitroGLYCERIN 0.4 MG SL tablet  Commonly known as:  NITROSTAT  Place 0.4 mg under the tongue every 5 (five) minutes as needed for chest pain.     traMADol 50 MG tablet  Commonly known as:  ULTRAM  Take 50 mg by mouth every 6 (six) hours as needed for moderate pain.        LABORATORY STUDIES CBC    Component Value Date/Time   WBC 10.3 12/07/2014 0740   RBC 4.31 12/07/2014 0740   RBC 2.59* 08/27/2007 1244   HGB 13.0 12/07/2014 0740   HCT 41.8 12/07/2014 0740   PLT 194 12/07/2014 0740   MCV 97.0 12/07/2014 0740   MCH 30.2 12/07/2014 0740   MCHC 31.1 12/07/2014 0740   RDW 15.3 12/07/2014 0740   LYMPHSABS 1.7 12/04/2014 1515   MONOABS 0.5 12/04/2014 1515   EOSABS 0.2 12/04/2014 1515   BASOSABS 0.0 12/04/2014 1515   CMP    Component Value Date/Time   NA 141 12/07/2014 0740   K 4.0 12/07/2014 0740   CL 103 12/07/2014 0740   CO2 27 12/07/2014 0740   GLUCOSE 211* 12/07/2014 0740   BUN 27* 12/07/2014 0740   CREATININE 1.69* 12/07/2014 0740   CALCIUM 8.8 12/07/2014 0740   PROT 7.6 12/04/2014 1515   ALBUMIN 3.9 12/04/2014 1515   AST 15 12/04/2014 1515   ALT 25 12/04/2014 1515   ALKPHOS 77 12/04/2014 1515   BILITOT 0.7 12/04/2014 1515   GFRNONAA 28* 12/07/2014 0740   GFRAA 32* 12/07/2014 0740   COAGS Lab Results  Component Value Date   INR 1.04 12/04/2014   INR 1.10 09/10/2013   INR 1.54* 07/15/2013   Lipid Panel    Component Value Date/Time   CHOL 291* 12/05/2014 0234   TRIG 259* 12/05/2014 0234  HDL 28* 12/05/2014 0234   CHOLHDL 10.4 12/05/2014 0234   VLDL 52* 12/05/2014 0234   LDLCALC 211*  12/05/2014 0234   HgbA1C  Lab Results  Component Value Date   HGBA1C 7.3* 12/05/2014   Cardiac Panel (last 3 results) No results for input(s): CKTOTAL, CKMB, TROPONINI, RELINDX in the last 72 hours. Urinalysis    Component Value Date/Time   COLORURINE YELLOW 12/04/2014 1956   APPEARANCEUR CLEAR 12/04/2014 1956   LABSPEC 1.006 12/04/2014 1956   PHURINE 6.5 12/04/2014 1956   GLUCOSEU NEGATIVE 12/04/2014 1956   HGBUR NEGATIVE 12/04/2014 1956   BILIRUBINUR NEGATIVE 12/04/2014 1956   KETONESUR NEGATIVE 12/04/2014 1956   PROTEINUR 100* 12/04/2014 1956   UROBILINOGEN 0.2 12/04/2014 1956   NITRITE NEGATIVE 12/04/2014 1956   LEUKOCYTESUR NEGATIVE 12/04/2014 1956   Urine Drug Screen     Component Value Date/Time   LABOPIA NONE DETECTED 12/04/2014 1956   COCAINSCRNUR NONE DETECTED 12/04/2014 1956   LABBENZ NONE DETECTED 12/04/2014 1956   AMPHETMU NONE DETECTED 12/04/2014 1956   THCU NONE DETECTED 12/04/2014 1956   LABBARB NONE DETECTED 12/04/2014 1956    Alcohol Level No results found for: ETH   SIGNIFICANT DIAGNOSTIC STUDIES  Ct Head (brain) Wo Contrast 12/04/2014  No definite acute cortical infarction. Chronic changes as described. Possible hyperattenuating LEFT middle cerebral artery M3 branch or branches could be indicative of distal emboli.   MRI HEAD IMPRESSION:  12/05/2014 1. No acute intracranial infarct or other abnormality identified.  2. Generalized cerebral atrophy with moderate chronic microvascular ischemic disease.   MRA HEAD IMPRESSION:  12/05/2014 1. No proximal branch occlusion or hemodynamically significant stenosis identified within the intracranial circulation.  2. Nonvisualization of the left vertebral artery, which may be hypoplastic or occluded in the neck. Dominant right vertebral artery widely patent.  3. Fetal origin of the right PCA.   Carotid Ultrasound - Bilateral 1-39% ICA stenosis, borderline >40%. Calcific plaque is present at  bilateral origin and velocities may be underestimated. Bilateral ECA stenosis. Right vertebral patent with antegrade flow. Left vertebral is atypical with loss of diastolic component, suggesting a distal obstruction.  2D echo - Normal LV function; moderate LVH; moderate LAE; mild AI; mild TR.     HISTORY OF PRESENT ILLNESS [name] is an 81 y.o. female with a history of atrial fibrillation who was riding with her family today when they noted acutely that she was unable to express herself. They did not note that she had any weakness. She was not complaining of SOB but was on O2. Patient was brought in for evaluation at that time (LKW 12/04/2014 at 1805). Initial NIHSS of 5. Preadmission mRankin: 3.  tPA was administered and patient was admitted to the neuro ICU for further evaluation and treatment.    HOSPITAL COURSE  Ms. [name] is a 81 y.o. female with history of afib not on anticoagulation, DM, obesity, OSA, previous DVT, and pulmonary hypertension presenting with aphasia. She did receive IV t-PA. Symptoms now resolved.  TIA - left MCA territory, likely secondary to A. fib not on anticoagulation   MRI - negative for acute infarct  MRA - nonvisualization of the left vertebral artery,  Carotid Doppler - unremarkable  2D Echo unremarkable  LDL 211, not at goal  HgbA1c 7.3, not at goal  On clopidogrel 75 mg orally every day prior to admission, given atrial fibrillation, hanged to Eliquis for secondary stroke prevention  Ongoing aggressive stroke risk factor management  Therapy recommendations: [facility]  PT, OT and ST  Disposition: d/c home with family and home health therapies  Atrial fibrillation w/ RVR  Not on anticoagulation PTA  Started on Eliquis 5mg  twice a day  Rate controlled after resuming home medications, Cardizem and metoprolol  Follow up with DR. Dunlap as an OP  Hypertension  Home meds: Diltiazem and metoprolol  Permissive  hypertension allowed in hospital   Long-term BP goal < 130/90  Hyperlipidemia  Home meds: No lipid lowering medications prior to admission  LDL 211, goal < 70  Patient allergy to statin, she need outpatient follow-up with PCP to consider PCSK9 inhibitors.  Diabetes, type 2 uncontrolled  HgbA1c 7.3, goal < 7.0  On Lantus  Other Stroke Risk Factors  Advanced age  Cigarette smoker, quit smoking 36 years ago.  Obesity, Body mass index is 33.88 kg/(m^2).   Obstructive sleep apnea  Other Active Problems  Renal insufficiency BUN 33 creatinine 1.67  Other Pertinent History  The patient's brother has a history of a clotting disorder.   DISCHARGE EXAM Blood pressure 112/42, pulse 98, temperature 97.5 F (36.4 C), temperature source Oral, resp. rate 20, height 5' 1.2" (1.554 m), weight 81.829 kg (180 lb 6.4 oz), last menstrual period 08/13/1957, SpO2 94 %. General - Well nourished, well developed, in no apparent distress. Ophthalmologic - Sharp disc margins OU. Cardiovascular - irregularly irregular heart rate and rhythm with RVR. Mental Status -  Level of arousal and orientation to time, place, and person were intact. Language including expression, naming, repetition, comprehension was assessed and found intact. Cranial Nerves II - XII - II - Visual field intact OU. III, IV, VI - Extraocular movements intact. V - Facial sensation intact bilaterally. VII - Facial movement intact bilaterally. VIII - Hearing & vestibular intact bilaterally. X - Palate elevates symmetrically. XI - Chin turning & shoulder shrug intact bilaterally. XII - Tongue protrusion intact. Motor Strength - The patient's strength was normal in all extremities and pronator drift was absent. Bulk was normal and fasciculations were absent.  Motor Tone - Muscle tone was assessed at the neck and appendages and was normal. Reflexes - The patient's reflexes were 1+ in all extremities and she had no  pathological reflexes. Sensory - Light touch, temperature/pinprick, vibration and proprioception, and Romberg testing were assessed and were symmetrical.  Coordination - The patient had normal movements in the hands and feet with no ataxia or dysmetria. Tremor was absent. Gait and Station - deferred due to safety concerns   Discharge Diet   Diet Carb Modified Fluid consistency:: Thin liquids  DISCHARGE PLAN  Disposition:  Home with HH PT, OT and ST   eliquis (apixaban) for secondary stroke prevention.  Follow-up MCNEILL,WENDY, MD in 2 weeks.  Follow-up Dr. Irish Lack as previously scheduled  Follow-up with Dr. Rosalin Hawking, Stroke Clinic in 2 months.  35 minutes were spent preparing discharge.  [place] for Pager information 12/07/2014 3:46 PM   I, the attending vascular neurologist, have personally obtained a history, examined the patient, evaluated laboratory data, individually viewed imaging studies and agree with radiology interpretations.  Together with the NP/PA, we formulated the assessment and plan of care which reflects our mutual decision.  I have made any additions or clarifications directly to the above note and agree with the findings and plan as currently documented.   Rosalin Hawking, MD PhD Stroke Neurology 12/07/2014 9:19 PM

## 2014-12-07 NOTE — Progress Notes (Signed)
Talked to patient about [place] choices with family members present; patient chose [place] for [facility] services; Mary with [facility] called for arrangements; Mindi Slicker RN,BSN,MHA [phone]

## 2014-12-07 NOTE — Progress Notes (Signed)
Patient discharged per orders. Family members at bedside. All d/c orders/instructions/medications/prescriptions/follow up care/follow up appointments discussed. Time spent discussing stroke signs and symptoms and which ones would require medical attention. Time allowed for questions/concerns. Patient and family members state they have none. Patient left the unit via wheelchair accompanied by family members and volunteer. - Roselyn Reef Ryland [place],RN

## 2014-12-07 NOTE — Discharge Instructions (Signed)

## 2014-12-07 NOTE — Clinical Documentation Improvement (Signed)
Supporting Information:  Renal insufficiency per 4/24 progress notes.  (Renal Insufficiency codes to chronic kidney disease).   Labs: Bun /    Creat /    GFR 4/26:  27 /    1.69 /    28 4/25:  24 /    1.47 /    33 4/23:  33 /    1.67 /    28   Possible Clinical Condition: . Document the stage of CKD --Chronic kidney disease, stage 1- GFR > OR = 90 --Chronic kidney disease, stage 2 (mild) - GFR 60-89 --Chronic kidney disease, stage 3 (moderate) - GFR 30-59 --Chronic kidney disease, stage 4 (severe) - GFR 15-29 --Chronic kidney disease, stage 5- GFR < 15 --End-stage renal disease (ESRD) . Document any underlying cause of CKD such as Diabetes or Hypertension . Document if the patient is dependent on Dialysis . Chronic renal failure without a documented stage will be assigned to Chronic kidney disease, unspecified . Document any associated diagnoses/conditions    Thank Sherian Maroon Documentation Specialist [phone] [email or web address] [email or web address]

## 2014-12-07 NOTE — Progress Notes (Signed)
STROKE TEAM PROGRESS NOTE   SUBJECTIVE (INTERVAL HISTORY) Family is at bedside. Patient excited to go home. On eliquis without problems.    OBJECTIVE Temp:  [97.3 F (36.3 C)-98.8 F (37.1 C)] 97.5 F (36.4 C) (04/26 0957) Pulse Rate:  [40-105] 98 (04/26 0957) Cardiac Rhythm:  [-] Atrial fibrillation (04/25 2100) Resp:  [14-25] 20 (04/26 0957) BP: (96-141)/(35-103) 112/42 mmHg (04/26 0957) SpO2:  [91 %-98 %] 94 % (04/26 0957) Weight:  [81.829 kg (180 lb 6.4 oz)] 81.829 kg (180 lb 6.4 oz) (04/25 2057)   Recent Labs Lab 12/05/14 2128 12/06/14 0736 12/06/14 1203 12/06/14 1714 12/07/14 0632  GLUCAP 125* 195* 207* 172* 193*    Recent Labs Lab 12/04/14 1515 12/06/14 0112 12/07/14 0740  NA 139 138 141  K 3.8 3.0* 4.0  CL 98 101 103  CO2 30 24 27   GLUCOSE 150* 176* 211*  BUN 33* 24* 27*  CREATININE 1.67* 1.47* 1.69*  CALCIUM 9.7 8.6 8.8    Recent Labs Lab 12/04/14 1515  AST 15  ALT 25  ALKPHOS 77  BILITOT 0.7  PROT 7.6  ALBUMIN 3.9    Recent Labs Lab 12/04/14 1515 12/07/14 0740  WBC 7.5 10.3  NEUTROABS 5.2  --   HGB 13.8 13.0  HCT 43.0 41.8  MCV 94.7 97.0  PLT 221 194   No results for input(s): CKTOTAL, CKMB, CKMBINDEX, TROPONINI in the last 168 hours.  Recent Labs  12/04/14 1515  LABPROT 13.8  INR 1.04    Recent Labs  12/04/14 1956  COLORURINE YELLOW  LABSPEC 1.006  PHURINE 6.5  GLUCOSEU NEGATIVE  HGBUR NEGATIVE  BILIRUBINUR NEGATIVE  KETONESUR NEGATIVE  PROTEINUR 100*  UROBILINOGEN 0.2  NITRITE NEGATIVE  LEUKOCYTESUR NEGATIVE       Component Value Date/Time   CHOL 291* 12/05/2014 0234   TRIG 259* 12/05/2014 0234   HDL 28* 12/05/2014 0234   CHOLHDL 10.4 12/05/2014 0234   VLDL 52* 12/05/2014 0234   LDLCALC 211* 12/05/2014 0234   Lab Results  Component Value Date   HGBA1C 7.3* 12/05/2014      Component Value Date/Time   LABOPIA NONE DETECTED 12/04/2014 1956   COCAINSCRNUR NONE DETECTED 12/04/2014 1956   LABBENZ NONE  DETECTED 12/04/2014 1956   AMPHETMU NONE DETECTED 12/04/2014 1956   THCU NONE DETECTED 12/04/2014 1956   LABBARB NONE DETECTED 12/04/2014 1956    No results for input(s): ETH in the last 168 hours.  Ct Head (brain) Wo Contrast 12/04/2014    No definite acute cortical infarction. Chronic changes as described.  Possible hyperattenuating LEFT middle cerebral artery M3 branch or branches could be indicative of distal emboli.     MRI HEAD IMPRESSION:   12/05/2014 1. No acute intracranial infarct or other abnormality identified.  2. Generalized cerebral atrophy with moderate chronic microvascular ischemic disease.    MRA HEAD IMPRESSION:   12/05/2014 1. No proximal branch occlusion or hemodynamically significant stenosis identified within the intracranial circulation.  2. Nonvisualization of the left vertebral artery, which may be hypoplastic or occluded in the neck. Dominant right vertebral artery widely patent.  3. Fetal origin of the right PCA.     Carotid Ultrasound - Bilateral 1-39% ICA stenosis, borderline >40%. Calcific plaque is present at bilateral origin and velocities may be underestimated. Bilateral ECA stenosis. Right vertebral patent with antegrade flow. Left vertebral is atypical with loss of diastolic component, suggesting a distal obstruction.  2D echo - Normal LV function; moderate LVH; moderate LAE; mild AI; mild  TR.   PHYSICAL EXAM Temp:  [97.3 F (36.3 C)-98.8 F (37.1 C)] 97.5 F (36.4 C) (04/26 0957) Pulse Rate:  [40-105] 98 (04/26 0957) Resp:  [14-25] 20 (04/26 0957) BP: (96-141)/(35-103) 112/42 mmHg (04/26 0957) SpO2:  [91 %-98 %] 94 % (04/26 0957) Weight:  [81.829 kg (180 lb 6.4 oz)] 81.829 kg (180 lb 6.4 oz) (04/25 2057)  General - Well nourished, well developed, in no apparent distress.  Ophthalmologic - Sharp disc margins OU.  Cardiovascular - irregularly irregular heart rate and rhythm with RVR.  Mental Status -  Level of arousal and orientation to  time, place, and person were intact. Language including expression, naming, repetition, comprehension was assessed and found intact.  Cranial Nerves II - XII - II - Visual field intact OU. III, IV, VI - Extraocular movements intact. V - Facial sensation intact bilaterally. VII - Facial movement intact bilaterally. VIII - Hearing & vestibular intact bilaterally. X - Palate elevates symmetrically. XI - Chin turning & shoulder shrug intact bilaterally. XII - Tongue protrusion intact.  Motor Strength - The patient's strength was normal in all extremities and pronator drift was absent.  Bulk was normal and fasciculations were absent.   Motor Tone - Muscle tone was assessed at the neck and appendages and was normal.  Reflexes - The patient's reflexes were 1+ in all extremities and she had no pathological reflexes.  Sensory - Light touch, temperature/pinprick, vibration and proprioception, and Romberg testing were assessed and were symmetrical.    Coordination - The patient had normal movements in the hands and feet with no ataxia or dysmetria.  Tremor was absent.  Gait and Station - deferred due to safety concerns.   ASSESSMENT/PLAN [name] is a 81 y.o. female with history of afib not on anticoagulation, DM, obesity, OSA, previous DVT, and pulmonary hypertension presenting with aphasia. She did receive IV t-PA on Saturday. Symptoms now resolved.  TIA - left MCA territory, likely secondary to A. fib not on anticoagulation   MRI  - negative for acute infarct  MRA  - nonvisualization of the left vertebral artery,  Carotid Doppler - unremarkable  2D Echo unremarkable  LDL 211, not at goal  HgbA1c 7.3, not at goal  Eliquis for VTE prophylaxis Diet Carb Modified Fluid consistency:: Thin; Room service appropriate?: Yes  clopidogrel 75 mg orally every day prior to admission, now on Eliquis and baby aspirin. Continue anticoagulation on discharge.  Ongoing aggressive  stroke risk factor management  Therapy recommendations: HH PT, OT and ST  Disposition: d/c home with family  Atrial fibrillation  Not on antipronation PTA  Put on Eliquis 500 one twice a day  Much better good control today  Resumed home medication Cardizem and metoprolol  Telemetry monitoring  Sees DR. [place] as an OP  Hypertension  Home meds:  Diltiazem and metoprolol Stable - Permissive hypertension <220/120 for 24-48 hours and then gradually normalize within 5-7 days Home has resumed Stable  Hyperlipidemia  Home meds: No lipid lowering medications prior to admission due to allergy  LDL 211, goal < 70  Patient allergy to statin, she need outpatient follow-up with PCP to consider PCSK9 inhibitors.  Diabetes  HgbA1c 7.3, goal < 7.0  On Lantus  Uncontrolled  SSI  CBG monitoring  Other Stroke Risk Factors  Advanced age  Cigarette smoker, quit smoking 36 years ago.  Obesity, Body mass index is 33.88 kg/(m^2).   Obstructive sleep apnea  Other Active Problems  Renal insufficiency BUN  33 creatinine 1.67  Other Pertinent History  The patient's brother has a history of a clotting disorder.  Tuscola Robinson for Pager information 12/07/2014 10:19 AM   I, the attending vascular neurologist, have personally obtained a history, examined the patient, evaluated laboratory data, individually viewed imaging studies and agree with radiology interpretations. Together with the NP/PA, we formulated the assessment and plan of care which reflects our mutual decision.  I have made any additions or clarifications directly to the above note and agree with the findings and plan as currently documented.   81 yo F with PMH of afib not on anticoagulation, DM, obesity, OSA, previous DVT presented with transient expressive aphasia. MRI negative and pt was put on eliquis. PT/OT recommend home health. Will d/c with eliquis.   Rosalin Hawking, MD  PhD Stroke Neurology 12/07/2014 9:16 PM      To contact Stroke Continuity provider, please refer to [email or web address] After hours, contact General Neurology

## 2014-12-08 DIAGNOSIS — I639 Cerebral infarction, unspecified: Secondary | ICD-10-CM | POA: Insufficient documentation

## 2014-12-09 ENCOUNTER — Telehealth: Payer: Self-pay | Admitting: Neurology

## 2014-12-09 NOTE — Telephone Encounter (Signed)
Verbal OKay given to Graisy over the phone.   Rosalin Hawking, MD PhD Stroke Neurology 12/09/2014 4:04 PM

## 2014-12-09 NOTE — Telephone Encounter (Signed)
Graisy from [facility] wanted to notify the DR. Xu that the patient is set up for home physical therapy and she is just needing a verbal okay. Please call and advice # [phone]

## 2014-12-10 ENCOUNTER — Other Ambulatory Visit: Payer: Self-pay | Admitting: *Deleted

## 2014-12-10 NOTE — Patient Outreach (Signed)
[facility]) Care Management  12/10/2014  [name] [facility] 1933/11/13 [id number]   Referral from [facility] Program:   Telephone call to patient to request consent to participate in [place] -Stroke transition series.  Patient advised of program. States she is currently receiving numerous calls and is getting follow up home health services since she had stroke.  Has primary care doctor and specialist involved in her care and will follow up will follow up as directed.  States she and her husband are aware of symptoms of stroke and know to call 911 if indicated.  Patient has declined  participation in program.  Case closed.     Sherrin Daisy, RN BSN [employer] Management Coordinator [facility] Care Management  [phone]

## 2015-01-03 ENCOUNTER — Telehealth: Payer: Self-pay | Admitting: Interventional Cardiology

## 2015-01-03 NOTE — Telephone Encounter (Signed)
New problem   Pt want to talk to nurse concerning how to take her medications. Please advise

## 2015-01-03 NOTE — Telephone Encounter (Signed)
Left message to call back  

## 2015-01-04 NOTE — Telephone Encounter (Signed)
Pt calling for prescriptions on her Eliquis, Lasix, Imdur, Lopressor and Cardizem. Pt states that she needs written prescriptions that her husband can take [facility] the [facility] [place] to get them filled because they will fill them for free. Informed pt that I would get those ready and have Dr. Irish Lack sign them when he was in the office again on Thursday. Pt verbalized understanding and was in agreement with this plan.

## 2015-01-06 ENCOUNTER — Other Ambulatory Visit: Payer: Self-pay

## 2015-01-06 ENCOUNTER — Other Ambulatory Visit: Payer: Self-pay | Admitting: *Deleted

## 2015-01-06 MED ORDER — APIXABAN 5 MG PO TABS: 5.0000 mg | ORAL_TABLET | Freq: Two times a day (BID) | ORAL | Status: DC

## 2015-01-06 MED ORDER — FUROSEMIDE 40 MG PO TABS: 40.0000 mg | ORAL_TABLET | Freq: Two times a day (BID) | ORAL | Status: DC

## 2015-01-06 MED ORDER — DILTIAZEM HCL ER COATED BEADS 360 MG PO CP24
360.0000 mg | ORAL_CAPSULE | Freq: Two times a day (BID) | ORAL | Status: DC
Start: 2015-01-06 — End: 2015-03-25

## 2015-01-06 MED ORDER — ISOSORBIDE MONONITRATE ER 30 MG PO TB24: ORAL_TABLET | ORAL | Status: DC

## 2015-01-06 MED ORDER — METOPROLOL TARTRATE 50 MG PO TABS: ORAL_TABLET | ORAL | Status: DC

## 2015-01-17 ENCOUNTER — Telehealth: Payer: Self-pay | Admitting: Internal Medicine

## 2015-01-17 MED ORDER — AMOXICILLIN-POT CLAVULANATE 875-125 MG PO TABS: 1.0000 | ORAL_TABLET | Freq: Two times a day (BID) | ORAL | Status: DC

## 2015-01-17 NOTE — Telephone Encounter (Signed)
Augmentin 875mg  has been sent to [facility].  Pt is aware. Nothing further needed.

## 2015-01-17 NOTE — Telephone Encounter (Signed)
Spoke with pt.  Discussed below per Dr. Annamaria Boots.  She verbalized understanding and is in agreement with plan.  Dr. Annamaria Boots, when trying to send augmentin, received override warning as an allergy/contraindication because pt has cefuroxime and cephalexin on allergy list. Both cause SOB, throat swelling, and rash. Please advise.  Thank you.  Allergies  Allergen Reactions  . Cefuroxime Axetil Shortness Of Breath, Swelling and Rash    Throat swelling  . Cephalexin Shortness Of Breath, Swelling and Rash    Throat swelling  . Clarithromycin Shortness Of Breath, Swelling and Rash    Throat swelling  . Doxycycline Shortness Of Breath, Swelling and Rash     rash on legs and feet, throat swelling  . Erythromycin Shortness Of Breath, Swelling and Rash    Throat swelling  . Tetracycline Shortness Of Breath, Swelling and Rash    Throat swelling

## 2015-01-17 NOTE — Telephone Encounter (Signed)
Suggest we let her try augmentin 875 mg, # 14, 1 twice daily for acute bronchitis. If she doesn't improve in a few days, then we will find a way to work her in with me or TP.

## 2015-01-17 NOTE — Telephone Encounter (Signed)
Pt states she has been coughing up brown mucus x 2 weeks now with wheezing at times with some chest tightness. No fever she is aware of but has not been checking. Pt is asking for an appt to come in to see you.  CY please advise.

## 2015-01-17 NOTE — Telephone Encounter (Signed)
Per CY-okay to give Rx as stated and benefits outweigh risks. Thanks.

## 2015-01-24 ENCOUNTER — Telehealth: Payer: Self-pay | Admitting: *Deleted

## 2015-01-24 NOTE — Telephone Encounter (Signed)
Spoke with patient re: reschedule her with Dr Erlinda Hong for hospital FU. She agreed to come in at 8:45 am, 02/23/15. She verbalized understanding.

## 2015-01-24 NOTE — Telephone Encounter (Signed)
-----   Message from Melvenia Beam, MD sent at 01/21/2015  6:48 PM EDT ----- This patient is on my schedule July 6th. This patient is Dr. Clydene Fake patient. Not sure why they are on my schedule so far out, doesn't Dr. Lenell Antu have an opening in July? If not I will see them but can you check if Dr. Leonie Man has any opening in July for his patient? Thanks!

## 2015-02-07 ENCOUNTER — Other Ambulatory Visit: Payer: Self-pay

## 2015-02-09 ENCOUNTER — Ambulatory Visit: Payer: Medicare Other | Admitting: Neurology

## 2015-02-09 ENCOUNTER — Telehealth: Payer: Self-pay | Admitting: Internal Medicine

## 2015-02-09 NOTE — Telephone Encounter (Signed)
Called spoke with pt. She is scheduled to see Dr. Annamaria Boots tomorrow at 4:30. Nothing further needed

## 2015-02-09 NOTE — Telephone Encounter (Signed)
Ok to put her in an RN held spot this week if you can find one

## 2015-02-09 NOTE — Telephone Encounter (Signed)
Pt calling back about appointment says that nurse had called her eariler, and she thought that she had an eye dr appointment for tomorrow, but she doesn't said that she is free all day, can she be worked in somewhere please advise.Hillery Hunter

## 2015-02-09 NOTE — Telephone Encounter (Signed)
Called spoke with pt. She fninshed ABX augmentin called in earlier this month. She c/o bilateral feet swelling, prod cough (yellow-green phlem at times tinge of red in it), wheezing, chest tx, increase SOB w/ any activity. Her O2 level is dropping in the 80's on 3 liters O2. Asking for appt if possible.. Please advise Dr. Annamaria Boots thanks  Allergies  Allergen Reactions  . Cefuroxime Axetil Shortness Of Breath, Swelling and Rash    Throat swelling  . Cephalexin Shortness Of Breath, Swelling and Rash    Throat swelling  . Clarithromycin Shortness Of Breath, Swelling and Rash    Throat swelling  . Doxycycline Shortness Of Breath, Swelling and Rash     rash on legs and feet, throat swelling  . Erythromycin Shortness Of Breath, Swelling and Rash    Throat swelling  . Tetracycline Shortness Of Breath, Swelling and Rash    Throat swelling     Current Outpatient Prescriptions on File Prior to Visit  Medication Sig Dispense Refill  . albuterol (PROAIR HFA) 108 (90 BASE) MCG/ACT inhaler 1-2 puffs QID prn (Patient taking differently: Inhale 1 puff into the lungs every 4 (four) hours as needed for shortness of breath. ) 1 Inhaler 6  . allopurinol (ZYLOPRIM) 100 MG tablet Take 1 tablet (100 mg total) by mouth daily.    Marland Kitchen amoxicillin-clavulanate (AUGMENTIN) 875-125 MG per tablet Take 1 tablet by mouth 2 (two) times daily. 14 tablet 0  . apixaban (ELIQUIS) 5 MG TABS tablet Take 1 tablet (5 mg total) by mouth 2 (two) times daily. 180 tablet 0  . azelastine (ASTELIN) 137 MCG/SPRAY nasal spray 1-2 sprays each nostril once or twice daily 90 mL 3  . budesonide (PULMICORT) 0.25 MG/2ML nebulizer solution Take 2 mLs (0.25 mg total) by nebulization daily. 60 mL prn  . colchicine 0.6 MG tablet Take 1.2 mg by mouth daily as needed. Takes as needed for gout attacks.    . diltiazem (CARDIZEM CD) 360 MG 24 hr capsule Take 1 capsule (360 mg total) by mouth 2 (two) times daily. 90 capsule 0  . furosemide (LASIX) 40 MG  tablet Take 1 tablet (40 mg total) by mouth 2 (two) times daily. Contact cardiology office or PCP if you experienced > 3 pounds overnight or > 5 pounds in 1 week for further instructions 90 tablet 0  . insulin glargine (LANTUS) 100 UNIT/ML injection Inject 0.2 mLs (20 Units total) into the skin daily. 10 mL 12  . insulin lispro (HUMALOG) 100 UNIT/ML injection Inject into the skin 3 (three) times daily before meals. Per sliding scale. Patient unsure about how much she really takes    . isosorbide mononitrate (IMDUR) 30 MG 24 hr tablet 1 tablet by mouth daily 90 tablet 0  . levothyroxine (SYNTHROID, LEVOTHROID) 25 MCG tablet Take 25 mcg by mouth See admin instructions. Take 1 tablet (25 mcg) on Monday, Wednesday, Friday    . levothyroxine (SYNTHROID, LEVOTHROID) 50 MCG tablet Take 50 mcg by mouth daily before breakfast. Take 50mg  on Tues, Thurs, Sat and sundays    . metoprolol (LOPRESSOR) 50 MG tablet 1 tablet by mouth twice a day 180 tablet 0  . nitroGLYCERIN (NITROSTAT) 0.4 MG SL tablet Place 0.4 mg under the tongue every 5 (five) minutes as needed for chest pain.     Marland Kitchen oxybutynin (DITROPAN XL) 10 MG 24 hr tablet Take 10 mg by mouth daily.     . traMADol (ULTRAM) 50 MG tablet Take 50 mg by mouth every 6 (  six) hours as needed for moderate pain.      No current facility-administered medications on file prior to visit.

## 2015-02-09 NOTE — Telephone Encounter (Signed)
Terri Jersey- can you fit her in with [name] this week ?

## 2015-02-09 NOTE — Telephone Encounter (Signed)
Pt states that she is available all day tomorrow if she can be worked in, can make any other day this week if needed.  CY/Katie please advise on when this patient can be worked in.  Thanks!

## 2015-02-10 ENCOUNTER — Ambulatory Visit (INDEPENDENT_AMBULATORY_CARE_PROVIDER_SITE_OTHER): Payer: Medicare Other | Admitting: Internal Medicine

## 2015-02-10 ENCOUNTER — Encounter: Payer: Self-pay | Admitting: Internal Medicine

## 2015-02-10 VITALS — BP 124/70 | HR 67 | Ht 63.0 in | Wt 187.2 lb

## 2015-02-10 DIAGNOSIS — I639 Cerebral infarction, unspecified: Secondary | ICD-10-CM | POA: Diagnosis not present

## 2015-02-10 DIAGNOSIS — J9621 Acute and chronic respiratory failure with hypoxia: Secondary | ICD-10-CM | POA: Diagnosis not present

## 2015-02-10 DIAGNOSIS — G4733 Obstructive sleep apnea (adult) (pediatric): Secondary | ICD-10-CM | POA: Diagnosis not present

## 2015-02-10 DIAGNOSIS — J441 Chronic obstructive pulmonary disease with (acute) exacerbation: Secondary | ICD-10-CM

## 2015-02-10 DIAGNOSIS — J40 Bronchitis, not specified as acute or chronic: Secondary | ICD-10-CM | POA: Diagnosis not present

## 2015-02-10 MED ORDER — LEVALBUTEROL HCL 0.63 MG/3ML IN NEBU
0.6300 mg | INHALATION_SOLUTION | Freq: Once | RESPIRATORY_TRACT | Status: AC
  Administered 2015-02-10: 0.63 mg via RESPIRATORY_TRACT

## 2015-02-10 MED ORDER — METHYLPREDNISOLONE ACETATE 80 MG/ML IJ SUSP
80.0000 mg | Freq: Once | INTRAMUSCULAR | Status: AC
  Administered 2015-02-10: 80 mg via INTRAMUSCULAR

## 2015-02-10 MED ORDER — AMOXICILLIN-POT CLAVULANATE 875-125 MG PO TABS: 1.0000 | ORAL_TABLET | Freq: Two times a day (BID) | ORAL | Status: DC

## 2015-02-10 NOTE — Progress Notes (Signed)
Patient ID: [name], female    DOB: [birth year], 81 y.o.   MRN: [id number]  HPI 01/11/11- 25 yo former smoker followed for OSA, OHS, occasional tracheobronchitis.Complicated by CAD/AFib, morbid obesity Last here Jun 15, 2010  - note reviewed. Denies significant changes. O2 dependent due to OHS. Complains of allergy with watering eyes and nose. Right ear hurts. Cardiologist added lisinopril for BP- discussed ACEI side effects.  Continues to use CPAP 16 every night with O2 2 L/M  Going to PT rehab to strengthen to reduce fall risk. Heat bothering her.  07/13/11-  13 yo former smoker followed for OSA, OHS, occasional tracheobronchitis.Complicated by CAD/AFib, morbid obesity Has had flu vaccine. She fought off a cold last month. Feels that control of her sleep apnea is good as she continues CPAP 16 with 2 L of oxygen/Advanced. She has noticed her weight is dropping, but she says she is not trying. She does not feel there is something wrong. Frequent nasal drip. No cough or wheeze. CXR 07/13/11 IMPRESSION:  1. Cardiomegaly.  2. Bronchitic changes.  Original Report Authenticated By: Glenice Bow, M.D.   01/11/12-  73 yo former smoker followed for OSA, OHS, occasional tracheobronchitis.Complicated by CAD/AFib, morbid obesity Has nights up able to sleep until 2-3am; CPAP every night for approx 8-9 hours Feels as though Zyrtec and singulair not helping as well right now. Having chest tightness last night and having uneasy "electric feelings" in chest.Cough-brown in color. Good compliance and control with CPAP 16/O2 2 L. Her sleep is occasionally delayed until 2 or 3 AM but she averages 8 or 9 hours of CPAP per night. Zyrtec and Singulair have been less help this spring with her seasonal allergic rhinitis. She is doing better now as the pollen season ends. Complains of "electric twinges" across her anterior chest. Admits reflux. Taking Tessalon for cough with deep breath. We discussed  reflux, cough and substernal discomfort as possibly related. We discussed her chest x-ray report from last visit.  09/08/12- 34 yo former smoker followed for OSA, OHS, occasional tracheobronchitis.Complicated by CAD/AFib, morbid obesity Has been breathing okay. Has not used her inhaler in a long time.  Being treated for stage IV kidney disease and being referred to nephrology. She says she has gradually lost 40 pounds. Our records indicate 11 pound weight loss since 2012. Complains of "allergy"-eyes water, nose runs despite Zyrtec and Singulair. Has continued oxygen 2 L with her CPAP 16/ Advanced. We discussed potential impact of weight loss..  04/23/13-  12 yo former smoker followed for OSA, OHS, occasional tracheobronchitis.Complicated by CAD/AFib, morbid obesity CPAP 16/O2 2 L./ Advanced Weight has been stable around the 192 pounds this year. She is comfortable with her CPAP plus oxygen and is sleeping pretty well. Using a rolling walker. Recently treated for heart failure. Her PCP did CXR/ [facility],.  11/05/13-79 yo former smoker followed for OSA, OHS, occasional tracheobronchitis, COPD/respiratory failure .Complicated by CAD/AFib/ CHF, morbid obesity, GERD, DM CPAP 16/ - quit.    O2 2 L./ Advanced FOLLOWS FOR:  Not wearing CPAP since October-- Reports breathing doing well. Weight down from 215 in 2010 to 163 lbs now Hospitalized in October with pneumonia, then 3 times for congestive heart failure. Continues oxygen 2 L Using Astelin nasal spray without need for rescue inhaler or active bronchodilators. Doing nebulized budesonide once daily  Acute- Dr Gwenette Greet HPI Patient comes in today for an acute sick visit. She is normally followed by Dr. Annamaria Boots for chronic respiratory failure, as  well as obesity hypoventilation syndrome. It is unclear if she has underlying obstructive lung disease. She gives a 2 day history of increasing chest congestion, cough with purulent mucus, and some increased shortness of  breath. She has had subjective fevers, and a neighbor who is a nurse has told her that her lungs sound abnormal.   05/13/14- 24 yo former smoker followed for OSA, OHS, occasional tracheobronchitis, COPD/respiratory failure .Complicated by CAD/AFib/ CHF, morbid obesity, GERD, DM CPAP 16/ - quit.    O2 2 L./ Advanced FOLLOWS FOR:Has trouble sleeping at times,sob occas.,cough-yellow and white,wheezing,denies cp or tightness,pnd,stuffy nose in am,not using CPAP   11/15/14- 72 yo former smoker followed for OSA/ /quit CPAP, OHS, occasional tracheobronchitis, COPD/respiratory failure .Complicated by CAD/AFib/ CHF, morbid obesity, GERD, DM CPAP 16/ - quit.    O2 2 L./ Advanced FOLLOWS FOR: . Pt reports having some breathing issues with the pollen - SOB.  Acute illness last week- malaise, fever, n&V, diarrhea, head congestion, clear mucus. Improving.  02/10/15- 53 yo former smoker followed for OSA/ /quit CPAP, OHS, occasional tracheobronchitis, COPD/respiratory failure .Complicated by CAD/AFib/ CHF, morbid obesity, GERD, DM CPAP 16/ - quit.    O2 3 L./ Advanced FOLLOWS FOR Pt c/o productive cough with yellow green mucus, increased SOB, chest tightness, wheezing, and bilateral lower ext edema x 3 weeks. Pt states finished Augmentin 11 days ago.   Review of Systems-see HPI Constitutional:   No-   Unexpected weight loss, no-night sweats, fevers, chills, fatigue, lassitude. HEENT:   No-  headaches, difficulty swallowing, tooth/dental problems, sore throat,       No-  sneezing, itching, ear ache, +nasal congestion, post nasal drip,  CV:  No-  anginal chest pain, orthopnea, PND, swelling in lower extremities, anasarca, dizziness, palpitations Resp: +  shortness of breath with exertion or at rest.             + productive cough,  No non-productive cough,  No- coughing up of blood.              +  change in color of mucus.  No- wheezing.   Skin: no-rash GI:  +HPI GU:  MS:  No-   joint pain or swelling.    Neuro-     nothing unusual Psych:  No- change in mood or affect. No depression or anxiety.  No memory loss.   Objective:   Physical Exam General- Alert, Oriented, Affect-appropriate, Distress- none acute, still quite obese, O2 3L/M, using a walker Skin- rash-none, lesions- none, excoriation- none Lymphadenopathy- none Head- atraumatic            Eyes- Gross vision intact, PERRLA, conjunctivae clear secretions, not injected            Ears- Hearing, canals-normal            Nose- +stuffy, no-Septal dev, mucus, polyps, erosion, perforation             Throat- Mallampati III-IV , mucosa clear , drainage- none, tonsils- atrophic Neck- flexible , trachea midline, no stridor , thyroid nl, carotid no bruit Chest - symmetrical excursion , unlabored           Heart/CV- RRR , no murmur , no gallop  , no rub, nl s1 s2                           - JVD- none , edema + trace edema, stasis changes- none, varices- none  Lung-less sign coarse breath sounds with harsh cough, wheeze- none, cough , dullness-none, rub- none           Chest wall-  Abd-  Br/ Gen/ Rectal- Not done, not indicated Extrem- cyanosis- none, clubbing, none, atrophy- none, strength- nl Neuro- grossly intact to observation

## 2015-02-10 NOTE — Patient Instructions (Signed)
Neb xop 0.63  Depo 80  Script refilling augmentin sent

## 2015-02-13 NOTE — Assessment & Plan Note (Signed)
She does not recognize snoring or daytime sleepiness

## 2015-02-13 NOTE — Assessment & Plan Note (Signed)
Recurrent bronchitis after quitting antibiotics. She is more likely a relapse in a reinfection. Plan-nebulizer Xopenex, Depo-Medrol, Augmentin 7 days

## 2015-02-13 NOTE — Assessment & Plan Note (Signed)
She has had to increase her oxygen from 2 L to 3 L most of the time

## 2015-02-16 ENCOUNTER — Ambulatory Visit: Payer: Medicare Other | Admitting: Neurology

## 2015-02-23 ENCOUNTER — Encounter: Payer: Self-pay | Admitting: Neurology

## 2015-02-23 ENCOUNTER — Ambulatory Visit (INDEPENDENT_AMBULATORY_CARE_PROVIDER_SITE_OTHER): Payer: Medicare Other | Admitting: Neurology

## 2015-02-23 VITALS — BP 132/71 | HR 66 | Ht 61.0 in | Wt 185.0 lb

## 2015-02-23 DIAGNOSIS — E1159 Type 2 diabetes mellitus with other circulatory complications: Secondary | ICD-10-CM | POA: Diagnosis not present

## 2015-02-23 DIAGNOSIS — Z7901 Long term (current) use of anticoagulants: Secondary | ICD-10-CM | POA: Diagnosis not present

## 2015-02-23 DIAGNOSIS — I482 Chronic atrial fibrillation, unspecified: Secondary | ICD-10-CM

## 2015-02-23 DIAGNOSIS — I639 Cerebral infarction, unspecified: Secondary | ICD-10-CM | POA: Diagnosis not present

## 2015-02-23 DIAGNOSIS — G451 Carotid artery syndrome (hemispheric): Secondary | ICD-10-CM | POA: Diagnosis not present

## 2015-02-23 DIAGNOSIS — I10 Essential (primary) hypertension: Secondary | ICD-10-CM

## 2015-02-23 NOTE — Patient Instructions (Signed)
-   continue eliquis and crestor for stroke prevention - check BP and glucose at home - Follow up with your primary care physician for stroke risk factor modification. Recommend maintain blood pressure goal <130/80, diabetes with hemoglobin A1c goal below 6.5% and lipids with LDL cholesterol goal below 70 mg/dL.  - you are on crestor 40mg  now, if repeat LDL still high or not able to control, may consider injectable meds for high cholesterol, for which you need to talk with your PCP or Dr. Miachel Roux for insurance approval - follow up with Dr. Annamaria Boots for COPD and respiratory infection - follow up in 3 months.

## 2015-02-27 DIAGNOSIS — Z7901 Long term (current) use of anticoagulants: Secondary | ICD-10-CM | POA: Insufficient documentation

## 2015-02-27 NOTE — Progress Notes (Signed)
STROKE NEUROLOGY FOLLOW UP NOTE  NAME: [name] DOB: 1934/06/22  REASON FOR VISIT: stroke follow up HISTORY FROM: pt and chart  Today we had the pleasure of seeing [name] in follow-up at our [facility]. Pt was accompanied by husband.   History Summary [name] is an 81 y.o. female with a history of atrial fibrillation not on AC, DM, obesity, OSA, previous DVT, COPD and pulmonary hypertension on home O2 was admitted on 12/04/14 due to transient expressive aphasia. She denied any weakness.Initial NIHSS = 5. MRI no infarct. LDL 211 and A1C 7.3. She was put on eliquis. Due to intolerance, she was advised for consideration of PCSK9 inhibitors. She was discharged in good condition.   Interval History During the interval time, the patient has been doing well from stroke standpoint. No recurrent symptoms. However, she does have increased SOB and O2 from 2L/m increased to 3L/m. She has recent infection and was put on Abx. Her crestor was resumed and she said the muscle pain is tolerable. BP today 132/71.     REVIEW OF SYSTEMS: Full 14 system review of systems performed and notable only for those listed below and in HPI above, all others are negative:  Constitutional:  fatigue Cardiovascular: leg swelling occasionally Ear/Nose/Throat:   Skin:  Eyes:   Respiratory:  SOB, wheezing Gastroitestinal:  constipation Genitourinary:  Hematology/Lymphatic:  Easy bleeding, anemia Endocrine: feeling cold Musculoskeletal:  Joint pain, aching muscles Allergy/Immunology:  allergies Neurological:  Slurry speech Psychiatric:  Sleep: insomnia, restless leg  The following represents the patient's updated allergies and side effects list: Allergies  Allergen Reactions  . Cefuroxime Axetil Shortness Of Breath, Swelling and Rash    Throat swelling  . Cephalexin Shortness Of Breath, Swelling and Rash    Throat swelling  . Clarithromycin Shortness Of Breath,  Swelling and Rash    Throat swelling  . Doxycycline Shortness Of Breath, Swelling and Rash     rash on legs and feet, throat swelling  . Erythromycin Shortness Of Breath, Swelling and Rash    Throat swelling  . Tetracycline Shortness Of Breath, Swelling and Rash    Throat swelling    The neurologically relevant items on the patient's problem list were reviewed on today's visit.  Neurologic Examination  A problem focused neurological exam (12 or more points of the single system neurologic examination, vital signs counts as 1 point, cranial nerves count for 8 points) was performed.  Blood pressure 132/71, pulse 66, height 5\' 1"  (1.549 m), weight 185 lb (83.915 kg), last menstrual period 08/13/1957.  General - Well nourished, well developed, in mild respiratory distress, on home O2.  Ophthalmologic - Fundi not visualized due to noncorporation due to SOB.  Cardiovascular - irregularly irregular heart rate and rhythm.  Mental Status -  Level of arousal and orientation to time, place, and person were intact. Language including expression, naming, repetition, comprehension was assessed and found intact. Fund of Knowledge was assessed and was intact.  Cranial Nerves II - XII - II - Visual field intact OU. III, IV, VI - Extraocular movements intact. V - Facial sensation intact bilaterally. VII - Facial movement intact bilaterally. VIII - Hearing & vestibular intact bilaterally. X - Palate elevates symmetrically. XI - Chin turning & shoulder shrug intact bilaterally. XII - Tongue protrusion intact.  Motor Strength - The patient's strength was normal in all extremities and pronator drift was absent.  Bulk was normal and fasciculations were absent.   Motor Tone -  Muscle tone was assessed at the neck and appendages and was normal.  Reflexes - The patient's reflexes were 1+ in all extremities and she had no pathological reflexes.  Sensory - Light touch, temperature/pinprick, vibration  and proprioception, and Romberg testing were assessed and were normal.    Coordination - The patient had normal movements in the hands and feet with no ataxia or dysmetria.  Tremor was absent.  Gait and Station - walk with walker, very slow, small stride.  Data reviewed: I personally reviewed the images and agree with the radiology interpretations.  Ct Head (brain) Wo Contrast 12/04/2014  No definite acute cortical infarction. Chronic changes as described. Possible hyperattenuating LEFT middle cerebral artery M3 branch or branches could be indicative of distal emboli.   MRI HEAD IMPRESSION:  12/05/2014 1. No acute intracranial infarct or other abnormality identified.  2. Generalized cerebral atrophy with moderate chronic microvascular ischemic disease.   MRA HEAD IMPRESSION:  12/05/2014 1. No proximal branch occlusion or hemodynamically significant stenosis identified within the intracranial circulation.  2. Nonvisualization of the left vertebral artery, which may be hypoplastic or occluded in the neck. Dominant right vertebral artery widely patent.  3. Fetal origin of the right PCA.   Carotid Ultrasound - Bilateral 1-39% ICA stenosis, borderline >40%. Calcific plaque is present at bilateral origin and velocities may be underestimated. Bilateral ECA stenosis. Right vertebral patent with antegrade flow. Left vertebral is atypical with loss of diastolic component, suggesting a distal obstruction.  2D echo - Normal LV function; moderate LVH; moderate LAE; mild AI; mild TR.  Component     Latest Ref Rng 12/05/2014  Cholesterol     0 - 200 mg/dL 291 (H)  Triglycerides     <150 mg/dL 259 (H)  HDL Cholesterol     >39 mg/dL 28 (L)  Total CHOL/HDL Ratio      10.4  VLDL     0 - 40 mg/dL 52 (H)  LDL (calc)     0 - 99 mg/dL 211 (H)  Hemoglobin A1C     4.8 - 5.6 % 7.3 (H)  Mean Plasma Glucose      163    Assessment: As you may recall, she is a 81 y.o. Caucasian female  with PMH of afib not on AC, DM, obesity, OSA, previous DVT, and pulmonary hypertension on home O2 was admitted on 12/04/14 due to transient expressive aphasia. MRI no infarct. LDL 211 and A1C 7.3. She was put on eliquis and resumed crestor as outpt. She is tolerating meds well. Still has SOB on home O2 due to PHTN, COPD. Aggressive risk factor control.   Plan:  - continue eliquis and crestor for stroke prevention - check BP and glucose at home - Follow up with your primary care physician for stroke risk factor modification. Recommend maintain blood pressure goal <130/80, diabetes with hemoglobin A1c goal below 6.5% and lipids with LDL cholesterol goal below 70 mg/dL.  - follow up with Dr. Annamaria Boots for COPD and respiratory infection - RTC in 3 months.   No orders of the defined types were placed in this encounter.    No orders of the defined types were placed in this encounter.    Patient Instructions  - continue eliquis and crestor for stroke prevention - check BP and glucose at home - Follow up with your primary care physician for stroke risk factor modification. Recommend maintain blood pressure goal <130/80, diabetes with hemoglobin A1c goal below 6.5% and lipids with LDL cholesterol  goal below 70 mg/dL.  - you are on crestor 40mg  now, if repeat LDL still high or not able to control, may consider injectable meds for high cholesterol, for which you need to talk with your PCP or Dr. Miachel Roux for insurance approval - follow up with Dr. Annamaria Boots for COPD and respiratory infection - follow up in 3 months.     Rosalin Hawking, MD PhD [facility] [address] [place], [place] [address] [phone]

## 2015-03-16 ENCOUNTER — Inpatient Hospital Stay (HOSPITAL_COMMUNITY)
Admission: EM | Admit: 2015-03-16 | Discharge: 2015-03-25 | DRG: 287 | Disposition: A | Discharge status: Home Health | Payer: Medicare Other | Attending: Internal Medicine | Admitting: Internal Medicine

## 2015-03-16 ENCOUNTER — Encounter (HOSPITAL_COMMUNITY): Payer: Self-pay | Admitting: Emergency Medicine

## 2015-03-16 ENCOUNTER — Emergency Department (HOSPITAL_COMMUNITY): Payer: Medicare Other

## 2015-03-16 DIAGNOSIS — G4733 Obstructive sleep apnea (adult) (pediatric): Secondary | ICD-10-CM | POA: Diagnosis not present

## 2015-03-16 DIAGNOSIS — S0003XA Contusion of scalp, initial encounter: Secondary | ICD-10-CM | POA: Diagnosis present

## 2015-03-16 DIAGNOSIS — E1159 Type 2 diabetes mellitus with other circulatory complications: Secondary | ICD-10-CM | POA: Diagnosis not present

## 2015-03-16 DIAGNOSIS — E871 Hypo-osmolality and hyponatremia: Secondary | ICD-10-CM | POA: Diagnosis present

## 2015-03-16 DIAGNOSIS — J42 Unspecified chronic bronchitis: Secondary | ICD-10-CM | POA: Diagnosis not present

## 2015-03-16 DIAGNOSIS — R0602 Shortness of breath: Secondary | ICD-10-CM

## 2015-03-16 DIAGNOSIS — N183 Chronic kidney disease, stage 3 (moderate): Secondary | ICD-10-CM | POA: Diagnosis present

## 2015-03-16 DIAGNOSIS — Z8543 Personal history of malignant neoplasm of ovary: Secondary | ICD-10-CM | POA: Diagnosis not present

## 2015-03-16 DIAGNOSIS — J449 Chronic obstructive pulmonary disease, unspecified: Secondary | ICD-10-CM | POA: Diagnosis present

## 2015-03-16 DIAGNOSIS — W19XXXA Unspecified fall, initial encounter: Secondary | ICD-10-CM

## 2015-03-16 DIAGNOSIS — Z794 Long term (current) use of insulin: Secondary | ICD-10-CM

## 2015-03-16 DIAGNOSIS — I4891 Unspecified atrial fibrillation: Secondary | ICD-10-CM | POA: Diagnosis not present

## 2015-03-16 DIAGNOSIS — Z87891 Personal history of nicotine dependence: Secondary | ICD-10-CM

## 2015-03-16 DIAGNOSIS — E1122 Type 2 diabetes mellitus with diabetic chronic kidney disease: Secondary | ICD-10-CM | POA: Diagnosis present

## 2015-03-16 DIAGNOSIS — K219 Gastro-esophageal reflux disease without esophagitis: Secondary | ICD-10-CM | POA: Diagnosis present

## 2015-03-16 DIAGNOSIS — E1165 Type 2 diabetes mellitus with hyperglycemia: Secondary | ICD-10-CM | POA: Diagnosis present

## 2015-03-16 DIAGNOSIS — Z86718 Personal history of other venous thrombosis and embolism: Secondary | ICD-10-CM | POA: Diagnosis not present

## 2015-03-16 DIAGNOSIS — E873 Alkalosis: Secondary | ICD-10-CM | POA: Diagnosis not present

## 2015-03-16 DIAGNOSIS — Z833 Family history of diabetes mellitus: Secondary | ICD-10-CM | POA: Diagnosis not present

## 2015-03-16 DIAGNOSIS — N179 Acute kidney failure, unspecified: Secondary | ICD-10-CM | POA: Diagnosis present

## 2015-03-16 DIAGNOSIS — E875 Hyperkalemia: Secondary | ICD-10-CM | POA: Diagnosis present

## 2015-03-16 DIAGNOSIS — I480 Paroxysmal atrial fibrillation: Secondary | ICD-10-CM | POA: Diagnosis present

## 2015-03-16 DIAGNOSIS — T148XXA Other injury of unspecified body region, initial encounter: Secondary | ICD-10-CM

## 2015-03-16 DIAGNOSIS — Z79899 Other long term (current) drug therapy: Secondary | ICD-10-CM

## 2015-03-16 DIAGNOSIS — K5900 Constipation, unspecified: Secondary | ICD-10-CM | POA: Diagnosis present

## 2015-03-16 DIAGNOSIS — Z9981 Dependence on supplemental oxygen: Secondary | ICD-10-CM | POA: Diagnosis not present

## 2015-03-16 DIAGNOSIS — I272 Other secondary pulmonary hypertension: Secondary | ICD-10-CM | POA: Diagnosis present

## 2015-03-16 DIAGNOSIS — IMO0002 Reserved for concepts with insufficient information to code with codable children: Secondary | ICD-10-CM | POA: Diagnosis present

## 2015-03-16 DIAGNOSIS — I2781 Cor pulmonale (chronic): Secondary | ICD-10-CM | POA: Diagnosis present

## 2015-03-16 DIAGNOSIS — E039 Hypothyroidism, unspecified: Secondary | ICD-10-CM | POA: Diagnosis present

## 2015-03-16 DIAGNOSIS — I2510 Atherosclerotic heart disease of native coronary artery without angina pectoris: Secondary | ICD-10-CM | POA: Diagnosis present

## 2015-03-16 DIAGNOSIS — R911 Solitary pulmonary nodule: Secondary | ICD-10-CM | POA: Diagnosis present

## 2015-03-16 DIAGNOSIS — I5033 Acute on chronic diastolic (congestive) heart failure: Secondary | ICD-10-CM | POA: Diagnosis present

## 2015-03-16 DIAGNOSIS — M810 Age-related osteoporosis without current pathological fracture: Secondary | ICD-10-CM | POA: Diagnosis present

## 2015-03-16 DIAGNOSIS — E662 Morbid (severe) obesity with alveolar hypoventilation: Secondary | ICD-10-CM | POA: Diagnosis present

## 2015-03-16 DIAGNOSIS — J9611 Chronic respiratory failure with hypoxia: Secondary | ICD-10-CM | POA: Diagnosis present

## 2015-03-16 DIAGNOSIS — N3281 Overactive bladder: Secondary | ICD-10-CM | POA: Diagnosis present

## 2015-03-16 DIAGNOSIS — I129 Hypertensive chronic kidney disease with stage 1 through stage 4 chronic kidney disease, or unspecified chronic kidney disease: Secondary | ICD-10-CM | POA: Diagnosis present

## 2015-03-16 DIAGNOSIS — I482 Chronic atrial fibrillation: Secondary | ICD-10-CM | POA: Diagnosis not present

## 2015-03-16 DIAGNOSIS — Z7901 Long term (current) use of anticoagulants: Secondary | ICD-10-CM | POA: Diagnosis not present

## 2015-03-16 DIAGNOSIS — Z955 Presence of coronary angioplasty implant and graft: Secondary | ICD-10-CM | POA: Diagnosis not present

## 2015-03-16 DIAGNOSIS — I509 Heart failure, unspecified: Secondary | ICD-10-CM | POA: Diagnosis not present

## 2015-03-16 DIAGNOSIS — E890 Postprocedural hypothyroidism: Secondary | ICD-10-CM | POA: Diagnosis not present

## 2015-03-16 DIAGNOSIS — W01190A Fall on same level from slipping, tripping and stumbling with subsequent striking against furniture, initial encounter: Secondary | ICD-10-CM | POA: Diagnosis present

## 2015-03-16 DIAGNOSIS — I701 Atherosclerosis of renal artery: Secondary | ICD-10-CM | POA: Diagnosis present

## 2015-03-16 DIAGNOSIS — R0600 Dyspnea, unspecified: Secondary | ICD-10-CM

## 2015-03-16 DIAGNOSIS — J411 Mucopurulent chronic bronchitis: Secondary | ICD-10-CM | POA: Diagnosis not present

## 2015-03-16 DIAGNOSIS — I481 Persistent atrial fibrillation: Secondary | ICD-10-CM | POA: Diagnosis not present

## 2015-03-16 LAB — URINALYSIS, ROUTINE W REFLEX MICROSCOPIC
Bilirubin Urine: NEGATIVE
GLUCOSE, UA: 250 mg/dL — AB
Ketones, ur: NEGATIVE mg/dL
Leukocytes, UA: NEGATIVE
Nitrite: NEGATIVE
Protein, ur: >300 mg/dL — AB
Specific Gravity, Urine: 1.013 (ref 1.005–1.030)
Urobilinogen, UA: 0.2 mg/dL (ref 0.0–1.0)
pH: 6 (ref 5.0–8.0)

## 2015-03-16 LAB — PROTIME-INR
INR: 1.63 — AB (ref 0.00–1.49)
Prothrombin Time: 19.3 seconds — ABNORMAL HIGH (ref 11.6–15.2)

## 2015-03-16 LAB — BASIC METABOLIC PANEL
Anion gap: 10 (ref 5–15)
BUN: 28 mg/dL — AB (ref 6–20)
CALCIUM: 9.3 mg/dL (ref 8.9–10.3)
CO2: 29 mmol/L (ref 22–32)
Chloride: 100 mmol/L — ABNORMAL LOW (ref 101–111)
Creatinine, Ser: 1.86 mg/dL — ABNORMAL HIGH (ref 0.44–1.00)
GFR calc Af Amer: 29 mL/min — ABNORMAL LOW (ref >60)
GFR, EST NON AFRICAN AMERICAN: 25 mL/min — AB (ref >60)
Glucose, Bld: 307 mg/dL — ABNORMAL HIGH (ref 65–99)
POTASSIUM: 3.8 mmol/L (ref 3.5–5.1)
SODIUM: 139 mmol/L (ref 135–145)

## 2015-03-16 LAB — CBC
HCT: 41 % (ref 36.0–46.0)
Hemoglobin: 12.9 g/dL (ref 12.0–15.0)
MCH: 30.2 pg (ref 26.0–34.0)
MCHC: 31.5 g/dL (ref 30.0–36.0)
MCV: 96 fL (ref 78.0–100.0)
Platelets: 166 10*3/uL (ref 150–400)
RBC: 4.27 MIL/uL (ref 3.87–5.11)
RDW: 15.4 % (ref 11.5–15.5)
WBC: 6.1 10*3/uL (ref 4.0–10.5)

## 2015-03-16 LAB — CBG MONITORING, ED: Glucose-Capillary: 251 mg/dL — ABNORMAL HIGH (ref 65–99)

## 2015-03-16 LAB — URINE MICROSCOPIC-ADD ON

## 2015-03-16 LAB — BRAIN NATRIURETIC PEPTIDE: B NATRIURETIC PEPTIDE 5: 1109.9 pg/mL — AB (ref 0.0–100.0)

## 2015-03-16 LAB — TROPONIN I: Troponin I: <0.03 ng/mL (ref <0.031)

## 2015-03-16 MED ORDER — ALLOPURINOL 100 MG PO TABS
100.0000 mg | ORAL_TABLET | Freq: Two times a day (BID) | ORAL | Status: DC
  Administered 2015-03-17 – 2015-03-25 (×18): 100 mg via ORAL
  Filled 2015-03-16 (×20): qty 1

## 2015-03-16 MED ORDER — ACETAMINOPHEN 325 MG PO TABS
650.0000 mg | ORAL_TABLET | Freq: Once | ORAL | Status: AC
  Administered 2015-03-16: 650 mg via ORAL
  Filled 2015-03-16: qty 2

## 2015-03-16 MED ORDER — ACETAMINOPHEN 325 MG PO TABS
650.0000 mg | ORAL_TABLET | ORAL | Status: DC | PRN
  Administered 2015-03-17 – 2015-03-24 (×8): 650 mg via ORAL
  Filled 2015-03-16 (×8): qty 2

## 2015-03-16 MED ORDER — INSULIN ASPART 100 UNIT/ML ~~LOC~~ SOLN
0.0000 [IU] | Freq: Every day | SUBCUTANEOUS | Status: DC
  Administered 2015-03-17: 4 [IU] via SUBCUTANEOUS
  Administered 2015-03-17: 3 [IU] via SUBCUTANEOUS
  Administered 2015-03-18 – 2015-03-22 (×3): 5 [IU] via SUBCUTANEOUS
  Administered 2015-03-23: 4 [IU] via SUBCUTANEOUS
  Administered 2015-03-25: 2 [IU] via SUBCUTANEOUS

## 2015-03-16 MED ORDER — TRAMADOL HCL 50 MG PO TABS
50.0000 mg | ORAL_TABLET | Freq: Four times a day (QID) | ORAL | Status: DC | PRN
  Administered 2015-03-17 (×2): 50 mg via ORAL
  Filled 2015-03-16 (×2): qty 1

## 2015-03-16 MED ORDER — INSULIN GLARGINE 100 UNIT/ML ~~LOC~~ SOLN
20.0000 [IU] | Freq: Two times a day (BID) | SUBCUTANEOUS | Status: DC
  Administered 2015-03-17 – 2015-03-22 (×12): 20 [IU] via SUBCUTANEOUS
  Filled 2015-03-16 (×14): qty 0.2

## 2015-03-16 MED ORDER — FUROSEMIDE 10 MG/ML IJ SOLN
40.0000 mg | Freq: Two times a day (BID) | INTRAMUSCULAR | Status: DC
  Administered 2015-03-17 (×2): 40 mg via INTRAVENOUS
  Filled 2015-03-16 (×7): qty 4

## 2015-03-16 MED ORDER — BUDESONIDE 0.25 MG/2ML IN SUSP
0.2500 mg | Freq: Every day | RESPIRATORY_TRACT | Status: DC
  Administered 2015-03-19 – 2015-03-23 (×4): 0.25 mg via RESPIRATORY_TRACT
  Filled 2015-03-16 (×9): qty 2

## 2015-03-16 MED ORDER — SODIUM CHLORIDE 0.9 % IV SOLN: 250.0000 mL | INTRAVENOUS | Status: DC | PRN

## 2015-03-16 MED ORDER — LEVOTHYROXINE SODIUM 25 MCG PO TABS
25.0000 ug | ORAL_TABLET | ORAL | Status: DC
  Administered 2015-03-18 – 2015-03-25 (×4): 25 ug via ORAL
  Filled 2015-03-16 (×5): qty 1

## 2015-03-16 MED ORDER — ISOSORBIDE MONONITRATE ER 30 MG PO TB24
30.0000 mg | ORAL_TABLET | Freq: Every day | ORAL | Status: DC
  Administered 2015-03-17 – 2015-03-25 (×9): 30 mg via ORAL
  Filled 2015-03-16 (×9): qty 1

## 2015-03-16 MED ORDER — SODIUM CHLORIDE 0.9 % IJ SOLN: 3.0000 mL | INTRAMUSCULAR | Status: DC | PRN

## 2015-03-16 MED ORDER — ALBUTEROL SULFATE HFA 108 (90 BASE) MCG/ACT IN AERS: 1.0000 | INHALATION_SPRAY | RESPIRATORY_TRACT | Status: DC | PRN

## 2015-03-16 MED ORDER — METOPROLOL TARTRATE 50 MG PO TABS
50.0000 mg | ORAL_TABLET | Freq: Two times a day (BID) | ORAL | Status: DC
  Administered 2015-03-17 (×3): 50 mg via ORAL
  Filled 2015-03-16 (×5): qty 1

## 2015-03-16 MED ORDER — FUROSEMIDE 10 MG/ML IJ SOLN
40.0000 mg | Freq: Once | INTRAMUSCULAR | Status: AC
  Administered 2015-03-16: 40 mg via INTRAVENOUS
  Filled 2015-03-16: qty 4

## 2015-03-16 MED ORDER — INSULIN ASPART 100 UNIT/ML ~~LOC~~ SOLN
0.0000 [IU] | Freq: Three times a day (TID) | SUBCUTANEOUS | Status: DC
  Administered 2015-03-17: 3 [IU] via SUBCUTANEOUS
  Administered 2015-03-17: 5 [IU] via SUBCUTANEOUS
  Administered 2015-03-17: 3 [IU] via SUBCUTANEOUS
  Administered 2015-03-18 (×2): 8 [IU] via SUBCUTANEOUS
  Administered 2015-03-18: 5 [IU] via SUBCUTANEOUS
  Administered 2015-03-19: 15 [IU] via SUBCUTANEOUS
  Administered 2015-03-19: 8 [IU] via SUBCUTANEOUS
  Administered 2015-03-19: 15 [IU] via SUBCUTANEOUS

## 2015-03-16 MED ORDER — SODIUM CHLORIDE 0.9 % IJ SOLN
3.0000 mL | Freq: Two times a day (BID) | INTRAMUSCULAR | Status: DC
  Administered 2015-03-16 – 2015-03-25 (×16): 3 mL via INTRAVENOUS

## 2015-03-16 MED ORDER — METHOCARBAMOL 500 MG PO TABS
500.0000 mg | ORAL_TABLET | Freq: Every day | ORAL | Status: DC
  Administered 2015-03-17 – 2015-03-24 (×9): 500 mg via ORAL
  Filled 2015-03-16 (×10): qty 1

## 2015-03-16 MED ORDER — DILTIAZEM HCL ER COATED BEADS 360 MG PO CP24
360.0000 mg | ORAL_CAPSULE | Freq: Two times a day (BID) | ORAL | Status: DC
  Administered 2015-03-17 (×2): 360 mg via ORAL
  Filled 2015-03-16 (×4): qty 1

## 2015-03-16 MED ORDER — LEVOTHYROXINE SODIUM 50 MCG PO TABS
50.0000 ug | ORAL_TABLET | ORAL | Status: DC
  Administered 2015-03-17 – 2015-03-24 (×5): 50 ug via ORAL
  Filled 2015-03-16 (×6): qty 1

## 2015-03-16 MED ORDER — ONDANSETRON HCL 4 MG/2ML IJ SOLN
4.0000 mg | Freq: Four times a day (QID) | INTRAMUSCULAR | Status: DC | PRN
  Administered 2015-03-17 – 2015-03-19 (×4): 4 mg via INTRAVENOUS
  Filled 2015-03-16 (×4): qty 2

## 2015-03-16 MED ORDER — OXYBUTYNIN CHLORIDE ER 10 MG PO TB24
10.0000 mg | ORAL_TABLET | Freq: Every day | ORAL | Status: DC
  Administered 2015-03-17 – 2015-03-25 (×9): 10 mg via ORAL
  Filled 2015-03-16 (×9): qty 1

## 2015-03-16 NOTE — ED Notes (Signed)
Dr. Posey Pronto at bedside, asked that we not get the pt up to walk to check oxygen saturation.

## 2015-03-16 NOTE — ED Notes (Signed)
Patient transported to CT 

## 2015-03-16 NOTE — ED Notes (Signed)
PTs CBG 251

## 2015-03-16 NOTE — ED Notes (Signed)
Patient transported to X-ray 

## 2015-03-16 NOTE — ED Notes (Signed)
Per EMS: walking, tripped on carpet, hit head on side of table, golf ball sized hematoma to left side of head.  No external bleeding, no loc, c/o left shoulder and knee pain.  SCCA cleared.  Normally on 2 or 3 liters of o2 at home for CHF and COPD, normally she can walk around at home without getting sob but today she did get sob.  12 lead unremarkable.  Hx of afib.  On blood thinners, eloquis.  Alert and oriented, can recall all events.    Last set of vitals: 160/90, 68 irreg, rr 18.  CBG 338.

## 2015-03-16 NOTE — ED Notes (Signed)
Attempted to call report

## 2015-03-16 NOTE — H&P (Addendum)
[facility] History and Physical  Patient: [name]  MRN: [id number]  DOB: Jan 07, 1934  DOS: the patient was seen and examined on 03/16/2015 PCP: MCNEILL,WENDY, MD  Referring physician: Dr Billy Fischer Chief Complaint: shortness of breath and fall  HPI: [name] is a 81 y.o. female with Past medical history of COPD, chronic diastolic CHF, intermittent fibrillation, chronic anticoagulation, coronary artery disease, chronic hypoxic respiratory failure on 2 L of oxygen at home, chronic anemia, hypothyroidism. The patient is presenting with numbness of progressively worsening shortness of breath since last one week. She mentions she has gained a few pounds as well. She also wonders of orthopnea and PND and has to sleep in a recliner. She mentions she is compliant with all her medications. Along with that today prior to her arrival she actually had a fall. When she was walking she suddenly lost her balance and fell on the ground. She also hit the head of the left side. She is the time of my evaluation denies any complaints of chest pain and abdominal pain dizziness or lightheadedness or any focal deficit. She denies any nausea vomiting or diarrhea. She denies any burning urination.  The patient is coming from home.  At her baseline ambulates with walkerAnd is independent for most of her ADL manages her medication on her own.  Review of Systems: as mentioned in the history of present illness.  A comprehensive review of the other systems is negative.  Past Medical History  Diagnosis Date  . CAD (coronary artery disease)   . CHF (congestive heart failure)   . Paroxysmal atrial fibrillation   . Bradycardia   . Hypertension   . Renal artery stenosis   . COPD (chronic obstructive pulmonary disease)   . Obesity hypoventilation syndrome   . Pruritus   . Tracheobronchitis   . Acute rhinosinusitis   . Anemia   . Hypoxemia   . Peripheral edema   . Obesity,  endogenous   . Diabetes mellitus   . Acid reflux disease   . Osteoarthritis, generalized   . Hypothyroidism   . Lung nodule     lingula  . OSA (obstructive sleep apnea)     NPSG 04/03/00--AHI 28.hr  . DVT (deep venous thrombosis) 2003    left leg  . Vaginal atrophy   . Hx of colonic polyps   . Acute diastolic heart failure   . Secondary pulmonary hypertension 06/09/2013  . Blood transfusion without reported diagnosis 2009    had internal bleeding  . Cancer 1959    ovarian cancer  . Osteoporosis    Past Surgical History  Procedure Laterality Date  . Lumbar spine surgery    . Cholecystectomy    . Hemorroidectomy    . Repair wound fistula    . Right shoulder    . Elbow surgery    . Dilation and curettage of uterus    . Breast surgery  1998    lumpectomy  . Oophorectomy  1959    BSO-? ovarian cancer  . Bilateral salpingoophorectomy  1959    -d/t ovarian cancer  . Rotator cuff repair Right    Social History:  reports that she quit smoking about 36 years ago. She has never used smokeless tobacco. She reports that she does not drink alcohol or use illicit drugs.  Allergies  Allergen Reactions  . Cefuroxime Axetil Shortness Of Breath, Swelling and Rash    Throat swelling  . Cephalexin Shortness Of Breath, Swelling and Rash  Throat swelling  . Clarithromycin Shortness Of Breath, Swelling and Rash    Throat swelling  . Doxycycline Shortness Of Breath, Swelling and Rash     rash on legs and feet, throat swelling  . Erythromycin Shortness Of Breath, Swelling and Rash    Throat swelling  . Tetracycline Shortness Of Breath, Swelling and Rash    Throat swelling    Family History  Problem Relation Age of Onset  . Pneumonia Mother   . Heart disease Mother   . Allergies Mother   . Emphysema Mother   . Arthritis Mother   . Hypertension Mother   . Heart attack Father   . Hypertension Father   . Diabetes Sister   . Heart disease Sister   . Diabetes Brother   .  Diabetes      grandfather  . Breast cancer      aunt  . Bone cancer      aunt  . Clotting disorder Brother   . Arthritis Sister   . Breast cancer Maternal Aunt     Prior to Admission medications   Medication Sig Start Date End Date Taking? Authorizing Provider  allopurinol (ZYLOPRIM) 100 MG tablet Take 1 tablet (100 mg total) by mouth daily. Patient taking differently: Take 100 mg by mouth 2 (two) times daily.  07/21/13  Yes Samella Parr, NP  apixaban (ELIQUIS) 5 MG TABS tablet Take 1 tablet (5 mg total) by mouth 2 (two) times daily. Patient taking differently: Take 2.5 mg by mouth 2 (two) times daily.  01/06/15  Yes Jettie Booze, MD  budesonide (PULMICORT) 0.25 MG/2ML nebulizer solution Take 2 mLs (0.25 mg total) by nebulization daily. 05/13/14  Yes Deneise Lever, MD  colchicine 0.6 MG tablet Take 1.2 mg by mouth daily as needed (for gout flareups).    Yes Historical Provider, MD  diltiazem (CARDIZEM CD) 360 MG 24 hr capsule Take 1 capsule (360 mg total) by mouth 2 (two) times daily. 01/06/15  Yes Jettie Booze, MD  furosemide (LASIX) 40 MG tablet Take 1 tablet (40 mg total) by mouth 2 (two) times daily. Contact cardiology office or PCP if you experienced > 3 pounds overnight or > 5 pounds in 1 week for further instructions 01/06/15  Yes Jettie Booze, MD  insulin glargine (LANTUS) 100 UNIT/ML injection Inject 0.2 mLs (20 Units total) into the skin daily. Patient taking differently: Inject 20-35 Units into the skin 2 (two) times daily. Takes 20 units in am and 35 units in pm 07/21/13  Yes Samella Parr, NP  insulin lispro (HUMALOG) 100 UNIT/ML injection Inject 2 Units into the skin 2 (two) times daily with breakfast and lunch. Per sliding scale. Patient unsure about how much she really takes   Yes Historical Provider, MD  isosorbide mononitrate (IMDUR) 30 MG 24 hr tablet 1 tablet by mouth daily Patient taking differently: Take 30 mg by mouth daily. 1 tablet by mouth  daily 01/06/15  Yes Jettie Booze, MD  levothyroxine (SYNTHROID, LEVOTHROID) 25 MCG tablet Take 25 mcg by mouth See admin instructions. Take 1 tablet (25 mcg) on Monday, Wednesday, Friday   Yes Historical Provider, MD  levothyroxine (SYNTHROID, LEVOTHROID) 50 MCG tablet Take 50 mcg by mouth See admin instructions. Take 50mg  on Tues, Thurs, Sat and sundays   Yes Historical Provider, MD  methocarbamol (ROBAXIN) 500 MG tablet Take 500 mg by mouth at bedtime.   Yes Historical Provider, MD  metoprolol (LOPRESSOR) 50 MG tablet 1  tablet by mouth twice a day Patient taking differently: Take 50 mg by mouth 2 (two) times daily. 1 tablet by mouth twice a day 01/06/15  Yes Jettie Booze, MD  nitroGLYCERIN (NITROSTAT) 0.4 MG SL tablet Place 0.4 mg under the tongue every 5 (five) minutes as needed for chest pain.    Yes Historical Provider, MD  oxybutynin (DITROPAN XL) 10 MG 24 hr tablet Take 10 mg by mouth daily.    Yes Historical Provider, MD  traMADol (ULTRAM) 50 MG tablet Take 50 mg by mouth every 6 (six) hours as needed for moderate pain.    Yes Historical Provider, MD  albuterol (PROAIR HFA) 108 (90 BASE) MCG/ACT inhaler 1-2 puffs QID prn Patient taking differently: Inhale 1 puff into the lungs every 4 (four) hours as needed for shortness of breath.  11/12/13   Deneise Lever, MD  amoxicillin-clavulanate (AUGMENTIN) 875-125 MG per tablet Take 1 tablet by mouth 2 (two) times daily. Patient not taking: Reported on 02/23/2015 02/10/15   Deneise Lever, MD  azelastine (ASTELIN) 137 MCG/SPRAY nasal spray 1-2 sprays each nostril once or twice daily 11/05/13   Deneise Lever, MD    Physical Exam: Filed Vitals:   03/16/15 2000 03/16/15 2030 03/16/15 2115 03/16/15 2130  BP: 176/75 163/96 167/88 159/75  Pulse: 49  73 137  Temp:      TempSrc:      Resp: 22 29 20 18   Height:      Weight:      SpO2: 97% 93% 97% 97%    General: Alert, Awake and Oriented to Time, Place and Person. Appear in moderate  distress Eyes: PERRL ENT: Oral Mucosa clear moist. Neck: positive JVD Cardiovascular: S1 and S2 Present, no Murmur, Peripheral Pulses Present Respiratory: Bilateral Air entry equal and Decreased,  Bilateral basal Crackles, no wheezes Abdomen: Bowel Sound present, Soft and non tender Skin: no Rash Extremities: bilateral Pedal edema, no calf tenderness Neurologic: Grossly no focal neuro deficit.  Labs on Admission:  CBC:  Recent Labs Lab 03/16/15 1924  WBC 6.1  HGB 12.9  HCT 41.0  MCV 96.0  PLT 166    CMP     Component Value Date/Time   NA 139 03/16/2015 1924   K 3.8 03/16/2015 1924   CL 100* 03/16/2015 1924   CO2 29 03/16/2015 1924   GLUCOSE 307* 03/16/2015 1924   BUN 28* 03/16/2015 1924   CREATININE 1.86* 03/16/2015 1924   CALCIUM 9.3 03/16/2015 1924   PROT 7.6 12/04/2014 1515   ALBUMIN 3.9 12/04/2014 1515   AST 15 12/04/2014 1515   ALT 25 12/04/2014 1515   ALKPHOS 77 12/04/2014 1515   BILITOT 0.7 12/04/2014 1515   GFRNONAA 25* 03/16/2015 1924   GFRAA 29* 03/16/2015 1924    No results for input(s): LIPASE, AMYLASE in the last 168 hours.   Recent Labs Lab 03/16/15 1944  TROPONINI <0.03   BNP (last 3 results)  Recent Labs  12/05/14 2250 03/16/15 1924  BNP 1143.9* 1109.9*    ProBNP (last 3 results) No results for input(s): PROBNP in the last 8760 hours.   Radiological Exams on Admission: Dg Chest 2 View  03/16/2015   CLINICAL DATA:  Fall today. Left sided chest pain and shortness of breath. Initial encounter. COPD and congestive heart failure.  EXAM: CHEST  2 VIEW  COMPARISON:  Seven hundred fourteen  FINDINGS: Mild to moderate cardiomegaly remains stable. Both lungs are clear. No evidence of pulmonary infiltrate or edema. No  evidence of pneumothorax or pleural effusion. Right shoulder prosthesis again noted.  IMPRESSION: Stable cardiomegaly.  No active lung disease.   Electronically Signed   By: Earle Gell M.D.   On: 03/16/2015 20:45   Ct Head Wo  Contrast  03/16/2015   CLINICAL DATA:  Tripped on carpet. Hit head on site of table. Headache, scalp hematoma, and neck pain.  EXAM: CT HEAD WITHOUT CONTRAST  CT CERVICAL SPINE WITHOUT CONTRAST  TECHNIQUE: Multidetector CT imaging of the head and cervical spine was performed following the standard protocol without intravenous contrast. Multiplanar CT image reconstructions of the cervical spine were also generated.  COMPARISON:  Head CT on 12/05/2014 and cervical spine CT on 12/31/2009  FINDINGS: CT HEAD FINDINGS  There is no evidence of intracranial hemorrhage, brain edema, or other signs of acute infarction. There is no evidence of intracranial mass lesion or mass effect. No abnormal extraaxial fluid collections are identified.  Mild to moderate chronic small vessel disease noted. No evidence of hydrocephalus. No evidence of skull fracture or pneumocephalus.  CT CERVICAL SPINE FINDINGS  No evidence of acute fracture, subluxation, or prevertebral soft tissue swelling.  Cervical degenerative disc disease is seen at most levels which is most pronounced at C4-5, C5-6, and C6-7. Mild to moderate facet DJD is also seen bilaterally at these levels. Atlantoaxial degenerative changes also noted. Generalized osteopenia demonstrated.  IMPRESSION: No acute intracranial abnormality. Mild to moderate chronic small vessel disease.  No evidence of acute cervical spine fracture or subluxation. Degenerative spondylosis, as described above. Osteopenia.   Electronically Signed   By: Earle Gell M.D.   On: 03/16/2015 21:28   Ct Cervical Spine Wo Contrast  03/16/2015   CLINICAL DATA:  Tripped on carpet. Hit head on site of table. Headache, scalp hematoma, and neck pain.  EXAM: CT HEAD WITHOUT CONTRAST  CT CERVICAL SPINE WITHOUT CONTRAST  TECHNIQUE: Multidetector CT imaging of the head and cervical spine was performed following the standard protocol without intravenous contrast. Multiplanar CT image reconstructions of the cervical  spine were also generated.  COMPARISON:  Head CT on 12/05/2014 and cervical spine CT on 12/31/2009  FINDINGS: CT HEAD FINDINGS  There is no evidence of intracranial hemorrhage, brain edema, or other signs of acute infarction. There is no evidence of intracranial mass lesion or mass effect. No abnormal extraaxial fluid collections are identified.  Mild to moderate chronic small vessel disease noted. No evidence of hydrocephalus. No evidence of skull fracture or pneumocephalus.  CT CERVICAL SPINE FINDINGS  No evidence of acute fracture, subluxation, or prevertebral soft tissue swelling.  Cervical degenerative disc disease is seen at most levels which is most pronounced at C4-5, C5-6, and C6-7. Mild to moderate facet DJD is also seen bilaterally at these levels. Atlantoaxial degenerative changes also noted. Generalized osteopenia demonstrated.  IMPRESSION: No acute intracranial abnormality. Mild to moderate chronic small vessel disease.  No evidence of acute cervical spine fracture or subluxation. Degenerative spondylosis, as described above. Osteopenia.   Electronically Signed   By: Earle Gell M.D.   On: 03/16/2015 21:28   EKG: Independently reviewed.atrial fibrillation with controlled rate nonspecific ST and T waves changes.  Assessment/Plan Principal Problem:   Acute on chronic diastolic heart failure Active Problems:   Hypothyroidism   Obstructive sleep apnea   Atrial fibrillation   COPD (chronic obstructive pulmonary disease)   Secondary pulmonary hypertension   Overactive bladder   Type 2 diabetes mellitus with other circulatory complications   1. Acute on chronic  diastolic heart failure The patient is presenting with numbness of progressively worsening shortness of breath as well as orthopnea and PND. Chest shows mild vascular congestion. elevation of serum BNP. With this and we will continue treating her with IV Lasix. Monitor ins and outs. Daily weight. Monitor renal function since she  has mild worsening of renal function as well.  2.atrial fibrillation. Continue Apixaban. Rate controlled. Monitor on telemetry.  3.chronic hypoxia with COPD and OSA. Continue close monitoring.  4.hypothyroidism. Continue Synthroid.  5.overactive bladder. Continue home medication.  6. Diabetes mellitus. Continue sliding scale insulin as well as insulin Lantus  Advance goals of care discussion: full code as per my discussion with patient. patient's husband will be her power of attorney  DVT Prophylaxis on chronic therapeutic anticoagulation. Nutrition: cardiac diet  Disposition: Admitted as inpatient, telemetry unit.  Author: Berle Mull, MD [facility] Pager: [phone] 03/16/2015  If 7PM-7AM, please contact night-coverage [email or web address] Password [email or web address]

## 2015-03-16 NOTE — ED Provider Notes (Signed)
CSN: [id number]     Arrival date & time 03/16/15  1845 History   First MD Initiated Contact with Patient 03/16/15 1858     Chief Complaint  Patient presents with  . Fall  . Head Injury     (Consider location/radiation/quality/duration/timing/severity/associated sxs/prior Treatment) HPI Comments: Tripped falling backwards at church today, hit left side of head on table No LOC Left sided headache and neck/shoulder pain  Patient is a 81 y.o. female presenting with fall and shortness of breath.  Fall This is a new problem. The current episode started less than 1 hour ago. The problem has not changed since onset.Associated symptoms include headaches and shortness of breath. Pertinent negatives include no chest pain and no abdominal pain. Nothing aggravates the symptoms. Nothing relieves the symptoms. She has tried nothing for the symptoms. The treatment provided no relief.  Shortness of Breath Severity:  Moderate Onset quality:  Gradual Duration: however worsening last night. Timing:  Intermittent Progression:  Worsening Chronicity:  Recurrent Context: activity   Relieved by:  Rest and sitting up Ineffective treatments:  Diuretics (normal diuretic dose) Associated symptoms: cough (pt reports improving, family reports getting worse), headaches, neck pain and sputum production (white)   Associated symptoms: no abdominal pain, no chest pain, no fever, no rash, no sore throat and no vomiting   Risk factors: no prolonged immobilization and no recent surgery     Past Medical History  Diagnosis Date  . CAD (coronary artery disease)   . CHF (congestive heart failure)   . Paroxysmal atrial fibrillation   . Bradycardia   . Hypertension   . Renal artery stenosis   . COPD (chronic obstructive pulmonary disease)   . Obesity hypoventilation syndrome   . Pruritus   . Tracheobronchitis   . Acute rhinosinusitis   . Anemia   . Hypoxemia   . Peripheral edema   . Obesity, endogenous   .  Diabetes mellitus   . Acid reflux disease   . Osteoarthritis, generalized   . Hypothyroidism   . Lung nodule     lingula  . OSA (obstructive sleep apnea)     NPSG 04/03/00--AHI 28.hr  . DVT (deep venous thrombosis) 2003    left leg  . Vaginal atrophy   . Hx of colonic polyps   . Acute diastolic heart failure   . Secondary pulmonary hypertension 06/09/2013  . Blood transfusion without reported diagnosis 2009    had internal bleeding  . Cancer 1959    ovarian cancer  . Osteoporosis    Past Surgical History  Procedure Laterality Date  . Lumbar spine surgery    . Cholecystectomy    . Hemorroidectomy    . Repair wound fistula    . Right shoulder    . Elbow surgery    . Dilation and curettage of uterus    . Breast surgery  1998    lumpectomy  . Oophorectomy  1959    BSO-? ovarian cancer  . Bilateral salpingoophorectomy  1959    -d/t ovarian cancer  . Rotator cuff repair Right    Family History  Problem Relation Age of Onset  . Pneumonia Mother   . Heart disease Mother   . Allergies Mother   . Emphysema Mother   . Arthritis Mother   . Hypertension Mother   . Heart attack Father   . Hypertension Father   . Diabetes Sister   . Heart disease Sister   . Diabetes Brother   . Diabetes  grandfather  . Breast cancer      aunt  . Bone cancer      aunt  . Clotting disorder Brother   . Arthritis Sister   . Breast cancer Maternal Aunt    History  Substance Use Topics  . Smoking status: Former Smoker    Quit date: 08/13/1978  . Smokeless tobacco: Never Used  . Alcohol Use: No   OB History    Gravida Para Term Preterm AB TAB SAB Ectopic Multiple Living   0               Obstetric Comments   2 adopted children     Review of Systems  Constitutional: Positive for fatigue. Negative for fever.  HENT: Negative for sore throat.   Eyes: Negative for visual disturbance.  Respiratory: Positive for cough (pt reports improving, family reports getting worse), sputum  production (white) and shortness of breath.   Cardiovascular: Positive for leg swelling. Negative for chest pain.  Gastrointestinal: Negative for nausea, vomiting, abdominal pain, diarrhea and constipation.  Genitourinary: Negative for difficulty urinating.  Musculoskeletal: Positive for neck pain. Negative for back pain.  Skin: Negative for rash.  Neurological: Positive for headaches. Negative for syncope and numbness.      Allergies  Cefuroxime axetil; Cephalexin; Clarithromycin; Doxycycline; Erythromycin; and Tetracycline  Home Medications   Prior to Admission medications   Medication Sig Start Date End Date Taking? Authorizing Provider  allopurinol (ZYLOPRIM) 100 MG tablet Take 1 tablet (100 mg total) by mouth daily. Patient taking differently: Take 100 mg by mouth 2 (two) times daily.  07/21/13  Yes Samella Parr, NP  apixaban (ELIQUIS) 5 MG TABS tablet Take 1 tablet (5 mg total) by mouth 2 (two) times daily. Patient taking differently: Take 2.5 mg by mouth 2 (two) times daily.  01/06/15  Yes Jettie Booze, MD  budesonide (PULMICORT) 0.25 MG/2ML nebulizer solution Take 2 mLs (0.25 mg total) by nebulization daily. 05/13/14  Yes Deneise Lever, MD  colchicine 0.6 MG tablet Take 1.2 mg by mouth daily as needed (for gout flareups).    Yes Historical Provider, MD  diltiazem (CARDIZEM CD) 360 MG 24 hr capsule Take 1 capsule (360 mg total) by mouth 2 (two) times daily. 01/06/15  Yes Jettie Booze, MD  furosemide (LASIX) 40 MG tablet Take 1 tablet (40 mg total) by mouth 2 (two) times daily. Contact cardiology office or PCP if you experienced > 3 pounds overnight or > 5 pounds in 1 week for further instructions 01/06/15  Yes Jettie Booze, MD  insulin glargine (LANTUS) 100 UNIT/ML injection Inject 0.2 mLs (20 Units total) into the skin daily. Patient taking differently: Inject 20-35 Units into the skin 2 (two) times daily. Takes 20 units in am and 35 units in pm 07/21/13  Yes  Samella Parr, NP  insulin lispro (HUMALOG) 100 UNIT/ML injection Inject 2 Units into the skin 2 (two) times daily with breakfast and lunch. Per sliding scale. Patient unsure about how much she really takes   Yes Historical Provider, MD  isosorbide mononitrate (IMDUR) 30 MG 24 hr tablet 1 tablet by mouth daily Patient taking differently: Take 30 mg by mouth daily. 1 tablet by mouth daily 01/06/15  Yes Jettie Booze, MD  levothyroxine (SYNTHROID, LEVOTHROID) 25 MCG tablet Take 25 mcg by mouth See admin instructions. Take 1 tablet (25 mcg) on Monday, Wednesday, Friday   Yes Historical Provider, MD  levothyroxine (SYNTHROID, LEVOTHROID) 50 MCG tablet Take  50 mcg by mouth See admin instructions. Take 50mg  on Tues, Thurs, Sat and sundays   Yes Historical Provider, MD  methocarbamol (ROBAXIN) 500 MG tablet Take 500 mg by mouth at bedtime.   Yes Historical Provider, MD  metoprolol (LOPRESSOR) 50 MG tablet 1 tablet by mouth twice a day Patient taking differently: Take 50 mg by mouth 2 (two) times daily. 1 tablet by mouth twice a day 01/06/15  Yes Jettie Booze, MD  nitroGLYCERIN (NITROSTAT) 0.4 MG SL tablet Place 0.4 mg under the tongue every 5 (five) minutes as needed for chest pain.    Yes Historical Provider, MD  oxybutynin (DITROPAN XL) 10 MG 24 hr tablet Take 10 mg by mouth daily.    Yes Historical Provider, MD  traMADol (ULTRAM) 50 MG tablet Take 50 mg by mouth every 6 (six) hours as needed for moderate pain.    Yes Historical Provider, MD  albuterol (PROAIR HFA) 108 (90 BASE) MCG/ACT inhaler 1-2 puffs QID prn Patient taking differently: Inhale 1 puff into the lungs every 4 (four) hours as needed for shortness of breath.  11/12/13   Deneise Lever, MD  amoxicillin-clavulanate (AUGMENTIN) 875-125 MG per tablet Take 1 tablet by mouth 2 (two) times daily. Patient not taking: Reported on 02/23/2015 02/10/15   Deneise Lever, MD  azelastine (ASTELIN) 137 MCG/SPRAY nasal spray 1-2 sprays each  nostril once or twice daily 11/05/13   Deneise Lever, MD   BP 123/83 mmHg  Pulse 87  Temp(Src) 98.1 F (36.7 C) (Oral)  Resp 18  Ht 5\' 1"  (1.549 m)  Wt 184 lb 9.6 oz (83.734 kg)  BMI 34.90 kg/m2  SpO2 91%  LMP 08/13/1957 Physical Exam  Constitutional: She is oriented to person, place, and time. She appears well-developed and well-nourished. No distress.  HENT:  Head: Normocephalic.  Mouth/Throat: Oropharynx is clear and moist. No oropharyngeal exudate.  Left parietal scalp hematoma 3cm  Eyes: Conjunctivae and EOM are normal. Pupils are equal, round, and reactive to light.  Neck: Normal range of motion. JVD: difficult to assess due to body habitus.  Cardiovascular: Normal rate, regular rhythm, normal heart sounds and intact distal pulses.  Exam reveals no gallop and no friction rub.   No murmur heard. Pulmonary/Chest: Effort normal. No respiratory distress. She has no wheezes. She has rales.  Abdominal: Soft. She exhibits no distension. There is no tenderness. There is no guarding.  Musculoskeletal: She exhibits no edema.       Right shoulder: She exhibits normal range of motion and no tenderness.       Left shoulder: She exhibits tenderness. She exhibits no bony tenderness. Decreased range of motion: painful ROM.       Right hip: She exhibits normal range of motion and normal strength.       Left hip: She exhibits normal range of motion and normal strength.       Right knee: She exhibits normal range of motion. No tenderness found.       Left knee: She exhibits normal range of motion. No tenderness found.  Neurological: She is alert and oriented to person, place, and time. She has normal strength. No cranial nerve deficit or sensory deficit. GCS eye subscore is 4. GCS verbal subscore is 5. GCS motor subscore is 6.  Skin: Skin is warm and dry. No rash noted. She is not diaphoretic. No erythema.  Nursing note and vitals reviewed.   ED Course  Procedures (including critical care  time) Labs  Review Labs Reviewed  BASIC METABOLIC PANEL - Abnormal; Notable for the following:    Chloride 100 (*)    Glucose, Bld 307 (*)    BUN 28 (*)    Creatinine, Ser 1.86 (*)    GFR calc non Af Amer 25 (*)    GFR calc Af Amer 29 (*)    All other components within normal limits  URINALYSIS, ROUTINE W REFLEX MICROSCOPIC (NOT AT [facility]) - Abnormal; Notable for the following:    Glucose, UA 250 (*)    Hgb urine dipstick SMALL (*)    Protein, ur >300 (*)    All other components within normal limits  PROTIME-INR - Abnormal; Notable for the following:    Prothrombin Time 19.3 (*)    INR 1.63 (*)    All other components within normal limits  BRAIN NATRIURETIC PEPTIDE - Abnormal; Notable for the following:    B Natriuretic Peptide 1109.9 (*)    All other components within normal limits  CBG MONITORING, ED - Abnormal; Notable for the following:    Glucose-Capillary 251 (*)    All other components within normal limits  CBC  TROPONIN I  URINE MICROSCOPIC-ADD ON  BASIC METABOLIC PANEL  HEMOGLOBIN A1C    Imaging Review Dg Chest 2 View  03/16/2015   CLINICAL DATA:  Fall today. Left sided chest pain and shortness of breath. Initial encounter. COPD and congestive heart failure.  EXAM: CHEST  2 VIEW  COMPARISON:  Seven hundred fourteen  FINDINGS: Mild to moderate cardiomegaly remains stable. Both lungs are clear. No evidence of pulmonary infiltrate or edema. No evidence of pneumothorax or pleural effusion. Right shoulder prosthesis again noted.  IMPRESSION: Stable cardiomegaly.  No active lung disease.   Electronically Signed   By: Earle Gell M.D.   On: 03/16/2015 20:45   Ct Head Wo Contrast  03/16/2015   CLINICAL DATA:  Tripped on carpet. Hit head on site of table. Headache, scalp hematoma, and neck pain.  EXAM: CT HEAD WITHOUT CONTRAST  CT CERVICAL SPINE WITHOUT CONTRAST  TECHNIQUE: Multidetector CT imaging of the head and cervical spine was performed following the standard protocol without  intravenous contrast. Multiplanar CT image reconstructions of the cervical spine were also generated.  COMPARISON:  Head CT on 12/05/2014 and cervical spine CT on 12/31/2009  FINDINGS: CT HEAD FINDINGS  There is no evidence of intracranial hemorrhage, brain edema, or other signs of acute infarction. There is no evidence of intracranial mass lesion or mass effect. No abnormal extraaxial fluid collections are identified.  Mild to moderate chronic small vessel disease noted. No evidence of hydrocephalus. No evidence of skull fracture or pneumocephalus.  CT CERVICAL SPINE FINDINGS  No evidence of acute fracture, subluxation, or prevertebral soft tissue swelling.  Cervical degenerative disc disease is seen at most levels which is most pronounced at C4-5, C5-6, and C6-7. Mild to moderate facet DJD is also seen bilaterally at these levels. Atlantoaxial degenerative changes also noted. Generalized osteopenia demonstrated.  IMPRESSION: No acute intracranial abnormality. Mild to moderate chronic small vessel disease.  No evidence of acute cervical spine fracture or subluxation. Degenerative spondylosis, as described above. Osteopenia.   Electronically Signed   By: Earle Gell M.D.   On: 03/16/2015 21:28   Ct Cervical Spine Wo Contrast  03/16/2015   CLINICAL DATA:  Tripped on carpet. Hit head on site of table. Headache, scalp hematoma, and neck pain.  EXAM: CT HEAD WITHOUT CONTRAST  CT CERVICAL SPINE WITHOUT CONTRAST  TECHNIQUE:  Multidetector CT imaging of the head and cervical spine was performed following the standard protocol without intravenous contrast. Multiplanar CT image reconstructions of the cervical spine were also generated.  COMPARISON:  Head CT on 12/05/2014 and cervical spine CT on 12/31/2009  FINDINGS: CT HEAD FINDINGS  There is no evidence of intracranial hemorrhage, brain edema, or other signs of acute infarction. There is no evidence of intracranial mass lesion or mass effect. No abnormal extraaxial fluid  collections are identified.  Mild to moderate chronic small vessel disease noted. No evidence of hydrocephalus. No evidence of skull fracture or pneumocephalus.  CT CERVICAL SPINE FINDINGS  No evidence of acute fracture, subluxation, or prevertebral soft tissue swelling.  Cervical degenerative disc disease is seen at most levels which is most pronounced at C4-5, C5-6, and C6-7. Mild to moderate facet DJD is also seen bilaterally at these levels. Atlantoaxial degenerative changes also noted. Generalized osteopenia demonstrated.  IMPRESSION: No acute intracranial abnormality. Mild to moderate chronic small vessel disease.  No evidence of acute cervical spine fracture or subluxation. Degenerative spondylosis, as described above. Osteopenia.   Electronically Signed   By: Earle Gell M.D.   On: 03/16/2015 21:28     EKG Interpretation   Date/Time:  Wednesday March 16 2015 19:01:39 EDT Ventricular Rate:  90 PR Interval:    QRS Duration: 101 QT Interval:  435 QTC Calculation: 532 R Axis:   101 Text Interpretation:  Atrial fibrillation Right axis deviation Low  voltage, precordial leads Similar to EKG September 09 2013. Since previous  EKG 4/23, pt has developed atrial fibrillation Confirmed by [facility] MD,  Burns ([id number]) on 03/17/2015 1:35:03 AM      MDM   Final diagnoses:  Fall, initial encounter  Contusion  CHF exacerbation   81 year old female with a history of hypothyroidism, diabetes, or any artery disease, CHF, atrial fibrillation on Eliquis, COPD on 2 L of oxygen at home who presents with concern of mechanical fall from standing and shortness of breath.  Patient with a fall while at church, hitting her head on a chair without loss of consciousness. Denies other areas of pain or injury other than head and neck, and have low suspicion for other injuries based off of history and exam. CT head and CT cervical spine both showed no sign of acute fracture or intracranial bleed. Labs obtained showing  mild worsening Creatinine to 1.8 from 1.69 previously.  Patient reports slowly worsening shortness of breath with 2 pound weight gain.  Has been on antiemetics over the last month for concern of pneumonia and has been off of them for the last week.  Chest x-ray was invited by myself and radiology report reviewed showing no signs of pneumonia and no severe pulmonary edema.  Patient is afebrile, without a leukocytosis and no signs of pneumonia on x-ray and have low suspicion for this as a cause of her shortness of breath. Her BNP is elevated to the 1000s, and given clinical history, exam, and weight gain feel this is the most likely cause of her dyspnea.  Patient given 40 mg of IV Lasix and hospitalist was consulted for admission.   Gareth Morgan, MD 03/17/15 0140

## 2015-03-17 ENCOUNTER — Inpatient Hospital Stay (HOSPITAL_COMMUNITY): Payer: Medicare Other

## 2015-03-17 DIAGNOSIS — I509 Heart failure, unspecified: Secondary | ICD-10-CM

## 2015-03-17 LAB — GLUCOSE, CAPILLARY
GLUCOSE-CAPILLARY: 194 mg/dL — AB (ref 65–99)
GLUCOSE-CAPILLARY: 261 mg/dL — AB (ref 65–99)
Glucose-Capillary: 312 mg/dL — ABNORMAL HIGH (ref 65–99)
Glucose-Capillary: 200 mg/dL — ABNORMAL HIGH (ref 65–99)
Glucose-Capillary: 218 mg/dL — ABNORMAL HIGH (ref 65–99)

## 2015-03-17 LAB — BASIC METABOLIC PANEL
ANION GAP: 8 (ref 5–15)
BUN: 24 mg/dL — AB (ref 6–20)
CALCIUM: 8.8 mg/dL — AB (ref 8.9–10.3)
CO2: 32 mmol/L (ref 22–32)
Chloride: 99 mmol/L — ABNORMAL LOW (ref 101–111)
Creatinine, Ser: 1.74 mg/dL — ABNORMAL HIGH (ref 0.44–1.00)
GFR calc non Af Amer: 27 mL/min — ABNORMAL LOW (ref >60)
GFR, EST AFRICAN AMERICAN: 31 mL/min — AB (ref >60)
Glucose, Bld: 257 mg/dL — ABNORMAL HIGH (ref 65–99)
Potassium: 3.6 mmol/L (ref 3.5–5.1)
Sodium: 139 mmol/L (ref 135–145)

## 2015-03-17 LAB — CBC WITH DIFFERENTIAL/PLATELET
BASOS ABS: 0 10*3/uL (ref 0.0–0.1)
BASOS PCT: 0 % (ref 0–1)
EOS PCT: 3 % (ref 0–5)
Eosinophils Absolute: 0.2 10*3/uL (ref 0.0–0.7)
HCT: 37.3 % (ref 36.0–46.0)
Hemoglobin: 11.6 g/dL — ABNORMAL LOW (ref 12.0–15.0)
Lymphocytes Relative: 16 % (ref 12–46)
Lymphs Abs: 1.2 10*3/uL (ref 0.7–4.0)
MCH: 30.3 pg (ref 26.0–34.0)
MCHC: 31.1 g/dL (ref 30.0–36.0)
MCV: 97.4 fL (ref 78.0–100.0)
MONOS PCT: 7 % (ref 3–12)
Monocytes Absolute: 0.5 10*3/uL (ref 0.1–1.0)
Neutro Abs: 5.3 10*3/uL (ref 1.7–7.7)
Neutrophils Relative %: 74 % (ref 43–77)
Platelets: 142 10*3/uL — ABNORMAL LOW (ref 150–400)
RBC: 3.83 MIL/uL — ABNORMAL LOW (ref 3.87–5.11)
RDW: 15.4 % (ref 11.5–15.5)
WBC: 7.2 10*3/uL (ref 4.0–10.5)

## 2015-03-17 LAB — TROPONIN I: Troponin I: <0.03 ng/mL (ref <0.031)

## 2015-03-17 MED ORDER — ALBUTEROL SULFATE (2.5 MG/3ML) 0.083% IN NEBU
2.5000 mg | INHALATION_SOLUTION | Freq: Four times a day (QID) | RESPIRATORY_TRACT | Status: DC
  Administered 2015-03-17 – 2015-03-18 (×2): 2.5 mg via RESPIRATORY_TRACT
  Filled 2015-03-17 (×2): qty 3

## 2015-03-17 MED ORDER — APIXABAN 5 MG PO TABS
5.0000 mg | ORAL_TABLET | Freq: Two times a day (BID) | ORAL | Status: DC
  Administered 2015-03-17 – 2015-03-19 (×6): 5 mg via ORAL
  Filled 2015-03-17 (×8): qty 1

## 2015-03-17 MED ORDER — PERFLUTREN LIPID MICROSPHERE
1.0000 mL | INTRAVENOUS | Status: AC | PRN
  Administered 2015-03-17: 2 mL via INTRAVENOUS
  Filled 2015-03-17: qty 10

## 2015-03-17 MED ORDER — ALBUTEROL SULFATE (2.5 MG/3ML) 0.083% IN NEBU: 2.5000 mg | INHALATION_SOLUTION | RESPIRATORY_TRACT | Status: DC | PRN

## 2015-03-17 NOTE — Evaluation (Signed)
Occupational Therapy Evaluation Patient Details Name: [name] MRN: [id number] DOB: 27-Apr-1934 Today's Date: 03/17/2015    History of Present Illness 81 yo admitted with fall, SOB and CHF exacerbation. PMHx of COPD on home O2   Clinical Impression   Pt was performing ADL, mobility and meal prep at a modified independent level. She is assisted for housekeeping and uses 2L 02 at home.  Pt limited by nausea with activity today.  Supervised for standing activities and ambulation with RW, but pt overall able to perform self care with set up. She is knowledgeable in energy conservation and breathing techniques. As medical issues resolve, pt should return to her baseline.  No further OT needs.    Follow Up Recommendations  No OT follow up    Equipment Recommendations  None recommended by OT    Recommendations for Other Services       Precautions / Restrictions Precautions Precautions: Fall      Mobility Bed Mobility              General bed mobility comments: pt in chair  Transfers Overall transfer level: Modified independent Equipment used: Rolling walker (2 wheeled)                  Balance Overall balance assessment: Needs assistance   Sitting balance-Leahy Scale: Good       Standing balance-Leahy Scale: Poor                              ADL Overall ADL's : Needs assistance/impaired Eating/Feeding: Independent;Sitting   Grooming: Wash/dry hands;Standing;Supervision/safety   Upper Body Bathing: Set up;Sitting   Lower Body Bathing: Set up;Sit to/from stand   Upper Body Dressing : Set up;Sitting   Lower Body Dressing: Set up;Sit to/from stand   Toilet Transfer: Supervision/safety;Ambulation;BSC;RW   Toileting- Clothing Manipulation and Hygiene: Modified independent;Sit to/from stand       Functional mobility during ADLs: Supervision/safety;Rolling walker       Vision     Perception     Praxis      Pertinent  Vitals/Pain Pain Assessment: No/denies pain     Hand Dominance Right   Extremity/Trunk Assessment Upper Extremity Assessment Upper Extremity Assessment: Overall WFL for tasks assessed   Lower Extremity Assessment Lower Extremity Assessment: Overall WFL for tasks assessed   Cervical / Trunk Assessment Cervical / Trunk Assessment: Kyphotic   Communication Communication Communication: No difficulties   Cognition Arousal/Alertness: Awake/alert Behavior During Therapy: WFL for tasks assessed/performed Overall Cognitive Status: Within Functional Limits for tasks assessed                     General Comments       Exercises       Shoulder Instructions      Home Living Family/patient expects to be discharged to:: Private residence Living Arrangements: Spouse/significant other;Other relatives (ex sister in law) Available Help at Discharge: Family;Available 24 hours/day Type of Home: House Home Access: Stairs to enter [employer] of Steps: 2   Home Layout: Multi-level;Bed/bath upstairs Alternate Level Stairs-Number of Steps: stair lift   Bathroom Shower/Tub: Walk-in shower;Door   [employer] Toilet: Standard Bathroom Accessibility: Yes How Accessible: Accessible via walker Home Equipment: Walker - 2 wheels;Walker - 4 wheels;Shower seat;Wheelchair - manual;Bedside commode   Additional Comments: Pt has vanity next to toilet to push up from       Prior Functioning/Environment Level of Independence: Independent with  assistive device(s)        Comments: walker inside and rollator outside, sister in law helps with housekeeping and some meals    OT Diagnosis:     OT Problem List:     OT Treatment/Interventions:      OT Goals(Current goals can be found in the care plan section) Acute Rehab OT Goals Patient Stated Goal: return home  OT Frequency:     Barriers to D/C:            Co-evaluation              End of Session Equipment Utilized  During Treatment: Rolling walker;Oxygen Nurse Communication:  (ok to give pt diet ginger ale)  Activity Tolerance: Treatment limited secondary to medical complications (Comment) (nausea) Patient left: in chair;with call bell/phone within reach   Time: 1150-1204 OT Time Calculation (min): 14 min Charges:  OT General Charges $OT Visit: 1 Procedure OT Evaluation $Initial OT Evaluation Tier I: 1 Procedure G-Codes:    Malka So 03/17/2015, 12:27 PM  [phone]

## 2015-03-17 NOTE — Progress Notes (Addendum)
ANTICOAGULATION CONSULT NOTE - Initial Consult  Pharmacy Consult for Eliquis Indication: atrial fibrillation  Allergies  Allergen Reactions  . Cefuroxime Axetil Shortness Of Breath, Swelling and Rash    Throat swelling  . Cephalexin Shortness Of Breath, Swelling and Rash    Throat swelling  . Clarithromycin Shortness Of Breath, Swelling and Rash    Throat swelling  . Doxycycline Shortness Of Breath, Swelling and Rash     rash on legs and feet, throat swelling  . Erythromycin Shortness Of Breath, Swelling and Rash    Throat swelling  . Tetracycline Shortness Of Breath, Swelling and Rash    Throat swelling    Patient Measurements: Height: 5\' 1"  (154.9 cm) Weight: 181 lb (82.1 kg) IBW/kg (Calculated) : 47.8  Vital Signs: Temp: 97.4 F (36.3 C) (08/04 0455) Temp Source: Oral (08/04 0455) BP: 118/76 mmHg (08/04 0455) Pulse Rate: 81 (08/04 0455)  Labs:  Recent Labs  03/16/15 1924 03/16/15 1926 03/16/15 1944  HGB 12.9  --   --   HCT 41.0  --   --   PLT 166  --   --   LABPROT  --  19.3*  --   INR  --  1.63*  --   CREATININE 1.86*  --   --   TROPONINI  --   --  <0.03    Estimated Creatinine Clearance: 24.2 mL/min (by C-G formula based on Cr of 1.86).   Medical History: Past Medical History  Diagnosis Date  . CAD (coronary artery disease)   . CHF (congestive heart failure)   . Paroxysmal atrial fibrillation   . Bradycardia   . Hypertension   . Renal artery stenosis   . COPD (chronic obstructive pulmonary disease)   . Obesity hypoventilation syndrome   . Pruritus   . Tracheobronchitis   . Acute rhinosinusitis   . Anemia   . Hypoxemia   . Peripheral edema   . Obesity, endogenous   . Diabetes mellitus   . Acid reflux disease   . Osteoarthritis, generalized   . Hypothyroidism   . Lung nodule     lingula  . OSA (obstructive sleep apnea)     NPSG 04/03/00--AHI 28.hr  . DVT (deep venous thrombosis) 2003    left leg  . Vaginal atrophy   . Hx of colonic  polyps   . Acute diastolic heart failure   . Secondary pulmonary hypertension 06/09/2013  . Blood transfusion without reported diagnosis 2009    had internal bleeding  . Cancer 1959    ovarian cancer  . Osteoporosis     Medications:  Prescriptions prior to admission  Medication Sig Dispense Refill Last Dose  . allopurinol (ZYLOPRIM) 100 MG tablet Take 1 tablet (100 mg total) by mouth daily. (Patient taking differently: Take 100 mg by mouth 2 (two) times daily. )   03/16/2015 at Unknown time  . apixaban (ELIQUIS) 5 MG TABS tablet Take 1 tablet (5 mg total) by mouth 2 (two) times daily. (Patient taking differently: Take 2.5 mg by mouth 2 (two) times daily. ) 180 tablet 0 03/16/2015 at am  . budesonide (PULMICORT) 0.25 MG/2ML nebulizer solution Take 2 mLs (0.25 mg total) by nebulization daily. 60 mL prn Past Month at Unknown time  . colchicine 0.6 MG tablet Take 1.2 mg by mouth daily as needed (for gout flareups).    2 months  . diltiazem (CARDIZEM CD) 360 MG 24 hr capsule Take 1 capsule (360 mg total) by mouth 2 (two) times daily.  90 capsule 0 03/16/2015 at Unknown time  . furosemide (LASIX) 40 MG tablet Take 1 tablet (40 mg total) by mouth 2 (two) times daily. Contact cardiology office or PCP if you experienced > 3 pounds overnight or > 5 pounds in 1 week for further instructions 90 tablet 0 03/16/2015 at Unknown time  . insulin glargine (LANTUS) 100 UNIT/ML injection Inject 0.2 mLs (20 Units total) into the skin daily. (Patient taking differently: Inject 20-35 Units into the skin 2 (two) times daily. Takes 20 units in am and 35 units in pm) 10 mL 12 03/16/2015 at Unknown time  . insulin lispro (HUMALOG) 100 UNIT/ML injection Inject 2 Units into the skin 2 (two) times daily with breakfast and lunch. Per sliding scale. Patient unsure about how much she really takes   03/16/2015 at Unknown time  . isosorbide mononitrate (IMDUR) 30 MG 24 hr tablet 1 tablet by mouth daily (Patient taking differently: Take 30 mg  by mouth daily. 1 tablet by mouth daily) 90 tablet 0 03/16/2015 at Unknown time  . levothyroxine (SYNTHROID, LEVOTHROID) 25 MCG tablet Take 25 mcg by mouth See admin instructions. Take 1 tablet (25 mcg) on Monday, Wednesday, Friday   03/16/2015 at Unknown time  . levothyroxine (SYNTHROID, LEVOTHROID) 50 MCG tablet Take 50 mcg by mouth See admin instructions. Take 50mg  on Tues, Thurs, Sat and sundays   03/15/2015 at Unknown time  . methocarbamol (ROBAXIN) 500 MG tablet Take 500 mg by mouth at bedtime.   03/15/2015 at Unknown time  . metoprolol (LOPRESSOR) 50 MG tablet 1 tablet by mouth twice a day (Patient taking differently: Take 50 mg by mouth 2 (two) times daily. 1 tablet by mouth twice a day) 180 tablet 0 03/16/2015 at 0900  . nitroGLYCERIN (NITROSTAT) 0.4 MG SL tablet Place 0.4 mg under the tongue every 5 (five) minutes as needed for chest pain.    not used  . oxybutynin (DITROPAN XL) 10 MG 24 hr tablet Take 10 mg by mouth daily.    03/16/2015 at Unknown time  . traMADol (ULTRAM) 50 MG tablet Take 50 mg by mouth every 6 (six) hours as needed for moderate pain.    Past Week at Unknown time  . albuterol (PROAIR HFA) 108 (90 BASE) MCG/ACT inhaler 1-2 puffs QID prn (Patient taking differently: Inhale 1 puff into the lungs every 4 (four) hours as needed for shortness of breath. ) 1 Inhaler 6 not used  . amoxicillin-clavulanate (AUGMENTIN) 875-125 MG per tablet Take 1 tablet by mouth 2 (two) times daily. (Patient not taking: Reported on 02/23/2015) 14 tablet 0 Not Taking  . azelastine (ASTELIN) 137 MCG/SPRAY nasal spray 1-2 sprays each nostril once or twice daily 90 mL 3 Taking   Scheduled:  . allopurinol  100 mg Oral BID  . budesonide  0.25 mg Nebulization Daily  . diltiazem  360 mg Oral BID  . furosemide  40 mg Intravenous BID  . insulin aspart  0-15 Units Subcutaneous TID WC  . insulin aspart  0-5 Units Subcutaneous QHS  . insulin glargine  20 Units Subcutaneous BID  . isosorbide mononitrate  30 mg Oral  Daily  . [START ON 03/18/2015] levothyroxine  25 mcg Oral Once per day on Mon Wed Fri  . levothyroxine  50 mcg Oral Once per day on Sun Tue Thu Sat  . methocarbamol  500 mg Oral QHS  . metoprolol  50 mg Oral BID  . oxybutynin  10 mg Oral Daily  . sodium chloride  3 mL Intravenous Q12H    Assessment: 81yo female hit head on table with a fall after tripping, golf-ball-sized hematoma to scalp but head CT clear, to continue Eliquis for Afib; of note pt was started on Eliquis 5mg  BID in April but now states that she takes 2.5mg  BID (using half of 5mg  tabs), which may be because of intolerance though I have found no MD documentation from neuro and cards notes that this was discussed.    Plan:  Will begin Eliquis 5mg  PO BID and educate pt on dosing.  Wynona Neat, PharmD, BCPS  03/17/2015,5:57 AM

## 2015-03-17 NOTE — Progress Notes (Signed)
  Echocardiogram 2D Echocardiogram with Definity has been performed.  Diamond Nickel 03/17/2015, 3:54 PM

## 2015-03-17 NOTE — Evaluation (Signed)
Physical Therapy Evaluation Patient Details Name: [name] MRN: [id number] DOB: 08-15-1933 Today's Date: 03/17/2015   History of Present Illness  81 yo admitted with fall, SOB and CHF exacerbation. PMHx of COPD on home O2  Clinical Impression  Pt moving well with limited activity tolerance. Pt currently requiring 3L of O2 for gait to maintain sats above 90%. Pt dropping to 89% on 2L with gait with dyspnea 2/4. Pt will benefit from acute therapy to maximize mobility, balance and gait as pt with one fall PTA and completely unaware of events leading to fall. Recommend daily mobility with nursing.     Follow Up Recommendations No PT follow up    Equipment Recommendations  None recommended by PT    Recommendations for Other Services       Precautions / Restrictions Precautions Precautions: Fall      Mobility  Bed Mobility Overal bed mobility: Modified Independent                Transfers Overall transfer level: Modified independent                  Ambulation/Gait Ambulation/Gait assistance: Supervision Ambulation Distance (Feet): 200 Feet Assistive device: Rolling walker (2 wheeled) Gait Pattern/deviations: Step-through pattern;Decreased stride length   Gait velocity interpretation: Below normal speed for age/gender General Gait Details: cues for posture and position in RW  Stairs            Wheelchair Mobility    Modified Rankin (Stroke Patients Only)       Balance Overall balance assessment: Needs assistance   Sitting balance-Leahy Scale: Good       Standing balance-Leahy Scale: Poor                               Pertinent Vitals/Pain Pain Assessment: No/denies pain    Home Living Family/patient expects to be discharged to:: Private residence Living Arrangements: Spouse/significant other Available Help at Discharge: Family;Available 24 hours/day Type of Home: House Home Access: Stairs to enter   [employer] of Steps: 2 Home Layout: Multi-level;Bed/bath upstairs Home Equipment: Walker - 2 wheels;Walker - 4 wheels;Shower seat;Wheelchair - manual;Bedside commode Additional Comments: Pt has vanity next to toilet to push up from     Prior Function Level of Independence: Independent with assistive device(s)         Comments: walker inside and rollator outside.     Hand Dominance        Extremity/Trunk Assessment   Upper Extremity Assessment: Overall WFL for tasks assessed           Lower Extremity Assessment: Overall WFL for tasks assessed      Cervical / Trunk Assessment: Kyphotic  Communication   Communication: No difficulties  Cognition Arousal/Alertness: Awake/alert Behavior During Therapy: WFL for tasks assessed/performed Overall Cognitive Status: Within Functional Limits for tasks assessed                      General Comments      Exercises        Assessment/Plan    PT Assessment Patient needs continued PT services  PT Diagnosis Difficulty walking   PT Problem List Decreased activity tolerance;Decreased knowledge of precautions;Cardiopulmonary status limiting activity  PT Treatment Interventions Gait training;DME instruction;Functional mobility training;Therapeutic activities;Patient/family education   PT Goals (Current goals can be found in the Care Plan section) Acute Rehab PT Goals Patient Stated Goal: return  home PT Goal Formulation: With patient Time For Goal Achievement: 03/31/15 Potential to Achieve Goals: Good    Frequency Min 3X/week   Barriers to discharge Decreased caregiver support      Co-evaluation               End of Session   Activity Tolerance: Patient tolerated treatment well Patient left: in chair;with call bell/phone within reach;with chair alarm set Nurse Communication: Mobility status         Time: 7341-9379 PT Time Calculation (min) (ACUTE ONLY): 25 min   Charges:   PT  Evaluation $Initial PT Evaluation Tier I: 1 Procedure PT Treatments $Gait Training: 8-22 mins   PT G CodesMelford Aase 03/17/2015, 9:52 AM Elwyn Reach, [place]

## 2015-03-17 NOTE — Progress Notes (Signed)
[facility] PROGRESS NOTE  [name] XVQ:[id number] DOB: 12-27-1933 DOA: 03/16/2015 PCP: Cari Caraway, MD  Assessment/Plan: 1. Acute on chronic diastolic heart failure The patient is presenting with progressive worsening shortness of breath as well as orthopnea and PND. Chest shows mild vascular congestion. elevation of serum BNP. Monitor ins and outs. Daily weight. Continue with lasix 40 mg IV BID.  Weight: -79---82  2.atrial fibrillation. Continue Apixaban. Continue with Cardizem, metoprolol.   3.chronic hypoxia with COPD and OSA. Continue close monitoring. Schedule nebulizer.   4.hypothyroidism. Continue Synthroid.  5.overactive bladder. Continue home medication.  6. Diabetes mellitus. Continue sliding scale insulin and  Lantus  7-AKI: CKD stage III; monitor on IV lasix.    Code Status: Full Code.  Family Communication: care discussed with patient.  Disposition Plan: Remain inpatient, PT evaluation.    Consultants:  none  Procedures:  ECHO;   Antibiotics:  none  HPI/Subjective: She is still SOB. Breathing better.  Mild cough/   Objective: Filed Vitals:   03/17/15 0455  BP: 118/76  Pulse: 81  Temp: 97.4 F (36.3 C)  Resp: 20    Intake/Output Summary (Last 24 hours) at 03/17/15 0941 Last data filed at 03/17/15 0918  Gross per 24 hour  Intake    703 ml  Output   1550 ml  Net   -847 ml   Filed Weights   03/16/15 1903 03/17/15 0159  Weight: 79.017 kg (174 lb 3.2 oz) 82.1 kg (181 lb)    Exam:   General:  Alert in no distress.   Cardiovascular: S 1, S 2 RRR  Respiratory: Bilateral crackles.   Abdomen: bs presents, soft, nt  Musculoskeletal: bilateral LE edema/   Data Reviewed: Basic Metabolic Panel:  Recent Labs Lab 03/16/15 1924 03/17/15 0459  NA 139 139  K 3.8 3.6  CL 100* 99*  CO2 29 32  GLUCOSE 307* 257*  BUN 28* 24*  CREATININE 1.86* 1.74*  CALCIUM 9.3 8.8*   Liver Function Tests: No results  for input(s): AST, ALT, ALKPHOS, BILITOT, PROT, ALBUMIN in the last 168 hours. No results for input(s): LIPASE, AMYLASE in the last 168 hours. No results for input(s): AMMONIA in the last 168 hours. CBC:  Recent Labs Lab 03/16/15 1924 03/17/15 0645  WBC 6.1 7.2  NEUTROABS  --  5.3  HGB 12.9 11.6*  HCT 41.0 37.3  MCV 96.0 97.4  PLT 166 142*   Cardiac Enzymes:  Recent Labs Lab 03/16/15 1944 03/17/15 0645  TROPONINI <0.03 <0.03   BNP (last 3 results)  Recent Labs  12/05/14 2250 03/16/15 1924  BNP 1143.9* 1109.9*    ProBNP (last 3 results) No results for input(s): PROBNP in the last 8760 hours.  CBG:  Recent Labs Lab 03/16/15 2002 03/17/15 0257 03/17/15 0656  GLUCAP 251* 312* 200*    No results found for this or any previous visit (from the past 240 hour(s)).   Studies: Dg Chest 2 View  03/16/2015   CLINICAL DATA:  Fall today. Left sided chest pain and shortness of breath. Initial encounter. COPD and congestive heart failure.  EXAM: CHEST  2 VIEW  COMPARISON:  Seven hundred fourteen  FINDINGS: Mild to moderate cardiomegaly remains stable. Both lungs are clear. No evidence of pulmonary infiltrate or edema. No evidence of pneumothorax or pleural effusion. Right shoulder prosthesis again noted.  IMPRESSION: Stable cardiomegaly.  No active lung disease.   Electronically Signed   By: Earle Gell M.D.   On: 03/16/2015 20:45   Ct  Head Wo Contrast  03/16/2015   CLINICAL DATA:  Tripped on carpet. Hit head on site of table. Headache, scalp hematoma, and neck pain.  EXAM: CT HEAD WITHOUT CONTRAST  CT CERVICAL SPINE WITHOUT CONTRAST  TECHNIQUE: Multidetector CT imaging of the head and cervical spine was performed following the standard protocol without intravenous contrast. Multiplanar CT image reconstructions of the cervical spine were also generated.  COMPARISON:  Head CT on 12/05/2014 and cervical spine CT on 12/31/2009  FINDINGS: CT HEAD FINDINGS  There is no evidence of  intracranial hemorrhage, brain edema, or other signs of acute infarction. There is no evidence of intracranial mass lesion or mass effect. No abnormal extraaxial fluid collections are identified.  Mild to moderate chronic small vessel disease noted. No evidence of hydrocephalus. No evidence of skull fracture or pneumocephalus.  CT CERVICAL SPINE FINDINGS  No evidence of acute fracture, subluxation, or prevertebral soft tissue swelling.  Cervical degenerative disc disease is seen at most levels which is most pronounced at C4-5, C5-6, and C6-7. Mild to moderate facet DJD is also seen bilaterally at these levels. Atlantoaxial degenerative changes also noted. Generalized osteopenia demonstrated.  IMPRESSION: No acute intracranial abnormality. Mild to moderate chronic small vessel disease.  No evidence of acute cervical spine fracture or subluxation. Degenerative spondylosis, as described above. Osteopenia.   Electronically Signed   By: Earle Gell M.D.   On: 03/16/2015 21:28   Ct Cervical Spine Wo Contrast  03/16/2015   CLINICAL DATA:  Tripped on carpet. Hit head on site of table. Headache, scalp hematoma, and neck pain.  EXAM: CT HEAD WITHOUT CONTRAST  CT CERVICAL SPINE WITHOUT CONTRAST  TECHNIQUE: Multidetector CT imaging of the head and cervical spine was performed following the standard protocol without intravenous contrast. Multiplanar CT image reconstructions of the cervical spine were also generated.  COMPARISON:  Head CT on 12/05/2014 and cervical spine CT on 12/31/2009  FINDINGS: CT HEAD FINDINGS  There is no evidence of intracranial hemorrhage, brain edema, or other signs of acute infarction. There is no evidence of intracranial mass lesion or mass effect. No abnormal extraaxial fluid collections are identified.  Mild to moderate chronic small vessel disease noted. No evidence of hydrocephalus. No evidence of skull fracture or pneumocephalus.  CT CERVICAL SPINE FINDINGS  No evidence of acute fracture,  subluxation, or prevertebral soft tissue swelling.  Cervical degenerative disc disease is seen at most levels which is most pronounced at C4-5, C5-6, and C6-7. Mild to moderate facet DJD is also seen bilaterally at these levels. Atlantoaxial degenerative changes also noted. Generalized osteopenia demonstrated.  IMPRESSION: No acute intracranial abnormality. Mild to moderate chronic small vessel disease.  No evidence of acute cervical spine fracture or subluxation. Degenerative spondylosis, as described above. Osteopenia.   Electronically Signed   By: Earle Gell M.D.   On: 03/16/2015 21:28    Scheduled Meds: . allopurinol  100 mg Oral BID  . apixaban  5 mg Oral BID  . budesonide  0.25 mg Nebulization Daily  . diltiazem  360 mg Oral BID  . furosemide  40 mg Intravenous BID  . insulin aspart  0-15 Units Subcutaneous TID WC  . insulin aspart  0-5 Units Subcutaneous QHS  . insulin glargine  20 Units Subcutaneous BID  . isosorbide mononitrate  30 mg Oral Daily  . [START ON 03/18/2015] levothyroxine  25 mcg Oral Once per day on Mon Wed Fri  . levothyroxine  50 mcg Oral Once per day on Sun Tue Thu  Sat  . methocarbamol  500 mg Oral QHS  . metoprolol  50 mg Oral BID  . oxybutynin  10 mg Oral Daily  . sodium chloride  3 mL Intravenous Q12H   Continuous Infusions:   Principal Problem:   Acute on chronic diastolic heart failure Active Problems:   Hypothyroidism   Obstructive sleep apnea   Atrial fibrillation   COPD (chronic obstructive pulmonary disease)   Secondary pulmonary hypertension   Overactive bladder   Type 2 diabetes mellitus with other circulatory complications    Time spent: 35 minutes.     Niel Hummer A  [facility] Pager [phone]. If 7PM-7AM, please contact night-coverage at [email or web address], password [facility] 03/17/2015, 9:41 AM  LOS: 1 day

## 2015-03-18 ENCOUNTER — Inpatient Hospital Stay (HOSPITAL_COMMUNITY): Payer: Medicare Other

## 2015-03-18 DIAGNOSIS — G4733 Obstructive sleep apnea (adult) (pediatric): Secondary | ICD-10-CM

## 2015-03-18 DIAGNOSIS — I5033 Acute on chronic diastolic (congestive) heart failure: Principal | ICD-10-CM

## 2015-03-18 DIAGNOSIS — I480 Paroxysmal atrial fibrillation: Secondary | ICD-10-CM

## 2015-03-18 LAB — BASIC METABOLIC PANEL
Anion gap: 7 (ref 5–15)
BUN: 32 mg/dL — ABNORMAL HIGH (ref 6–20)
CALCIUM: 8.5 mg/dL — AB (ref 8.9–10.3)
CO2: 32 mmol/L (ref 22–32)
Chloride: 95 mmol/L — ABNORMAL LOW (ref 101–111)
Creatinine, Ser: 2 mg/dL — ABNORMAL HIGH (ref 0.44–1.00)
GFR calc non Af Amer: 23 mL/min — ABNORMAL LOW (ref >60)
GFR, EST AFRICAN AMERICAN: 26 mL/min — AB (ref >60)
Glucose, Bld: 260 mg/dL — ABNORMAL HIGH (ref 65–99)
Potassium: 4.1 mmol/L (ref 3.5–5.1)
Sodium: 134 mmol/L — ABNORMAL LOW (ref 135–145)

## 2015-03-18 LAB — CBC
HCT: 38.7 % (ref 36.0–46.0)
Hemoglobin: 11.9 g/dL — ABNORMAL LOW (ref 12.0–15.0)
MCH: 29.8 pg (ref 26.0–34.0)
MCHC: 30.7 g/dL (ref 30.0–36.0)
MCV: 96.8 fL (ref 78.0–100.0)
Platelets: 149 10*3/uL — ABNORMAL LOW (ref 150–400)
RBC: 4 MIL/uL (ref 3.87–5.11)
RDW: 15.5 % (ref 11.5–15.5)
WBC: 6.5 10*3/uL (ref 4.0–10.5)

## 2015-03-18 LAB — GLUCOSE, CAPILLARY
GLUCOSE-CAPILLARY: 258 mg/dL — AB (ref 65–99)
Glucose-Capillary: 251 mg/dL — ABNORMAL HIGH (ref 65–99)
Glucose-Capillary: 207 mg/dL — ABNORMAL HIGH (ref 65–99)
Glucose-Capillary: 377 mg/dL — ABNORMAL HIGH (ref 65–99)

## 2015-03-18 LAB — HEMOGLOBIN A1C
HEMOGLOBIN A1C: 9.5 % — AB (ref 4.8–5.6)
MEAN PLASMA GLUCOSE: 226 mg/dL

## 2015-03-18 MED ORDER — ALBUTEROL SULFATE (2.5 MG/3ML) 0.083% IN NEBU
2.5000 mg | INHALATION_SOLUTION | Freq: Four times a day (QID) | RESPIRATORY_TRACT | Status: DC
  Administered 2015-03-18 – 2015-03-20 (×9): 2.5 mg via RESPIRATORY_TRACT
  Filled 2015-03-18 (×10): qty 3

## 2015-03-18 MED ORDER — METOPROLOL TARTRATE 50 MG PO TABS
50.0000 mg | ORAL_TABLET | Freq: Two times a day (BID) | ORAL | Status: DC
  Administered 2015-03-18 – 2015-03-25 (×14): 50 mg via ORAL
  Filled 2015-03-18 (×16): qty 1

## 2015-03-18 MED ORDER — FUROSEMIDE 10 MG/ML IJ SOLN
40.0000 mg | Freq: Two times a day (BID) | INTRAMUSCULAR | Status: DC
  Administered 2015-03-18 – 2015-03-22 (×8): 40 mg via INTRAVENOUS
  Filled 2015-03-18 (×8): qty 4

## 2015-03-18 MED ORDER — METHYLPREDNISOLONE SODIUM SUCC 40 MG IJ SOLR
40.0000 mg | Freq: Two times a day (BID) | INTRAMUSCULAR | Status: DC
  Administered 2015-03-18 – 2015-03-19 (×3): 40 mg via INTRAVENOUS
  Filled 2015-03-18 (×4): qty 1

## 2015-03-18 MED ORDER — GUAIFENESIN ER 600 MG PO TB12
600.0000 mg | ORAL_TABLET | Freq: Two times a day (BID) | ORAL | Status: DC
  Administered 2015-03-18 – 2015-03-24 (×13): 600 mg via ORAL
  Filled 2015-03-18 (×14): qty 1

## 2015-03-18 NOTE — Progress Notes (Signed)
[facility] PROGRESS NOTE  [name] WIO:[id number] DOB: 05-08-1934 DOA: 03/16/2015 PCP: Cari Caraway, MD  Assessment/Plan: 1. Acute on chronic diastolic heart failure The patient is presenting with progressive worsening shortness of breath as well as orthopnea and PND. Chest shows mild vascular congestion. elevation of serum BNP. Monitor ins and outs. Continue with lasix 40 mg IV BID.  Weight: -79---82 Cardiology consulted, Chest x ray with vascular congestion, raising Cr.   2.Atrial fibrillation. Continue Apixaban. HR drop to 37 this am. She received 360- Cardizem  BUD yesterday. I reviewed meds with patient she take 180 mg BID of Cardizem. If she get 360 tablet she only takes once a day.  Holder parameter for metoprolol.  If HR increased will resume Cardizem 180 mg BID.   3.Chronic hypoxia with COPD and OSA. Continue close monitoring. Schedule nebulizer.   4.hypothyroidism. Continue Synthroid.  5.overactive bladder. Continue home medication.  6. Diabetes mellitus. Continue sliding scale insulin and  Lantus  7-AKI: CKD stage III; monitor on IV lasix.    Code Status: Full Code.  Family Communication: care discussed with patient.  Disposition Plan: Remain inpatient, PT evaluation.    Consultants:  none  Procedures:  ECHO; Left ventricle: The cavity size was normal. There was mild concentric hypertrophy. Systolic function was normal. The estimated ejection fraction was in the range of 60% to 65%. Wall motion was normal; there were no regional wall motion abnormalities. - Aortic valve: There was trivial regurgitation. - Mitral valve: Severely calcified annulus. - Left atrium: The atrium was moderately dilated. - Right ventricle: The cavity size was severely dilated. Systolic function was moderately reduced. - Right atrium: The atrium was severely dilated. - Tricuspid valve: There was moderate regurgitation. - Pulmonary arteries:  Systolic pressure was severely increased. PA peak pressure: 68 mm Hg (S).  Antibiotics:  none  HPI/Subjective: She is coughing a lot. Would like something for cough. She is breathing better  Objective: Filed Vitals:   03/18/15 1346  BP: 109/58  Pulse: 74  Temp: 97.3 F (36.3 C)  Resp: 20    Intake/Output Summary (Last 24 hours) at 03/18/15 1706 Last data filed at 03/18/15 1530  Gross per 24 hour  Intake    360 ml  Output    650 ml  Net   -290 ml   Filed Weights   03/16/15 1903 03/17/15 0159 03/18/15 0635  Weight: 79.017 kg (174 lb 3.2 oz) 82.1 kg (181 lb) 83.915 kg (185 lb)    Exam:   General:  Alert in no distress.   Cardiovascular: S 1, S 2 RRR  Respiratory: Bilateral crackles.   Abdomen: bs presents, soft, nt  Musculoskeletal: bilateral LE edema/   Data Reviewed: Basic Metabolic Panel:  Recent Labs Lab 03/16/15 1924 03/17/15 0459 03/18/15 0443  NA 139 139 134*  K 3.8 3.6 4.1  CL 100* 99* 95*  CO2 29 32 32  GLUCOSE 307* 257* 260*  BUN 28* 24* 32*  CREATININE 1.86* 1.74* 2.00*  CALCIUM 9.3 8.8* 8.5*   Liver Function Tests: No results for input(s): AST, ALT, ALKPHOS, BILITOT, PROT, ALBUMIN in the last 168 hours. No results for input(s): LIPASE, AMYLASE in the last 168 hours. No results for input(s): AMMONIA in the last 168 hours. CBC:  Recent Labs Lab 03/16/15 1924 03/17/15 0645 03/18/15 0443  WBC 6.1 7.2 6.5  NEUTROABS  --  5.3  --   HGB 12.9 11.6* 11.9*  HCT 41.0 37.3 38.7  MCV 96.0 97.4 96.8  PLT  166 142* 149*   Cardiac Enzymes:  Recent Labs Lab 03/16/15 1944 03/17/15 0645 03/17/15 1640 03/17/15 1729  TROPONINI <0.03 <0.03 <0.03 <0.03   BNP (last 3 results)  Recent Labs  12/05/14 2250 03/16/15 1924  BNP 1143.9* 1109.9*    ProBNP (last 3 results) No results for input(s): PROBNP in the last 8760 hours.  CBG:  Recent Labs Lab 03/17/15 1614 03/17/15 2115 03/18/15 0638 03/18/15 1121 03/18/15 1615  GLUCAP  218* 261* 251* 207* 258*    No results found for this or any previous visit (from the past 240 hour(s)).   Studies: Dg Chest 2 View  03/18/2015   CLINICAL DATA:  Cough and nausea for 2 months, dyspnea, coronary artery disease, CHF, atrial fibrillation, COPD, diabetes mellitus, coronary artery disease, former smoker  EXAM: CHEST  2 VIEW  COMPARISON:  03/16/2015  FINDINGS: Enlargement of cardiac silhouette with vascular congestion.  Atherosclerotic calcification aorta.  Peribronchial thickening with chronic accentuation of interstitial markings little changed.  Mitral annular calcification noted.  Bibasilar atelectasis.  No pulmonary infiltrate, pleural effusion or pneumothorax.  Osseous demineralization with compression deformity of a vertebral body at the thoracolumbar junction question T12 unchanged.  RIGHT shoulder prosthesis with LEFT glenohumeral degenerative changes noted.  Surgical clips RIGHT upper quadrant likely prior cholecystectomy.  IMPRESSION: Chronic bronchitic and interstitial changes with bibasilar atelectasis.  Enlargement of cardiac silhouette with pulmonary vascular congestion.   Electronically Signed   By: Lavonia Dana M.D.   On: 03/18/2015 10:14   Dg Chest 2 View  03/16/2015   CLINICAL DATA:  Fall today. Left sided chest pain and shortness of breath. Initial encounter. COPD and congestive heart failure.  EXAM: CHEST  2 VIEW  COMPARISON:  Seven hundred fourteen  FINDINGS: Mild to moderate cardiomegaly remains stable. Both lungs are clear. No evidence of pulmonary infiltrate or edema. No evidence of pneumothorax or pleural effusion. Right shoulder prosthesis again noted.  IMPRESSION: Stable cardiomegaly.  No active lung disease.   Electronically Signed   By: Earle Gell M.D.   On: 03/16/2015 20:45   Ct Head Wo Contrast  03/16/2015   CLINICAL DATA:  Tripped on carpet. Hit head on site of table. Headache, scalp hematoma, and neck pain.  EXAM: CT HEAD WITHOUT CONTRAST  CT CERVICAL SPINE  WITHOUT CONTRAST  TECHNIQUE: Multidetector CT imaging of the head and cervical spine was performed following the standard protocol without intravenous contrast. Multiplanar CT image reconstructions of the cervical spine were also generated.  COMPARISON:  Head CT on 12/05/2014 and cervical spine CT on 12/31/2009  FINDINGS: CT HEAD FINDINGS  There is no evidence of intracranial hemorrhage, brain edema, or other signs of acute infarction. There is no evidence of intracranial mass lesion or mass effect. No abnormal extraaxial fluid collections are identified.  Mild to moderate chronic small vessel disease noted. No evidence of hydrocephalus. No evidence of skull fracture or pneumocephalus.  CT CERVICAL SPINE FINDINGS  No evidence of acute fracture, subluxation, or prevertebral soft tissue swelling.  Cervical degenerative disc disease is seen at most levels which is most pronounced at C4-5, C5-6, and C6-7. Mild to moderate facet DJD is also seen bilaterally at these levels. Atlantoaxial degenerative changes also noted. Generalized osteopenia demonstrated.  IMPRESSION: No acute intracranial abnormality. Mild to moderate chronic small vessel disease.  No evidence of acute cervical spine fracture or subluxation. Degenerative spondylosis, as described above. Osteopenia.   Electronically Signed   By: Earle Gell M.D.   On: 03/16/2015 21:28  Ct Cervical Spine Wo Contrast  03/16/2015   CLINICAL DATA:  Tripped on carpet. Hit head on site of table. Headache, scalp hematoma, and neck pain.  EXAM: CT HEAD WITHOUT CONTRAST  CT CERVICAL SPINE WITHOUT CONTRAST  TECHNIQUE: Multidetector CT imaging of the head and cervical spine was performed following the standard protocol without intravenous contrast. Multiplanar CT image reconstructions of the cervical spine were also generated.  COMPARISON:  Head CT on 12/05/2014 and cervical spine CT on 12/31/2009  FINDINGS: CT HEAD FINDINGS  There is no evidence of intracranial hemorrhage,  brain edema, or other signs of acute infarction. There is no evidence of intracranial mass lesion or mass effect. No abnormal extraaxial fluid collections are identified.  Mild to moderate chronic small vessel disease noted. No evidence of hydrocephalus. No evidence of skull fracture or pneumocephalus.  CT CERVICAL SPINE FINDINGS  No evidence of acute fracture, subluxation, or prevertebral soft tissue swelling.  Cervical degenerative disc disease is seen at most levels which is most pronounced at C4-5, C5-6, and C6-7. Mild to moderate facet DJD is also seen bilaterally at these levels. Atlantoaxial degenerative changes also noted. Generalized osteopenia demonstrated.  IMPRESSION: No acute intracranial abnormality. Mild to moderate chronic small vessel disease.  No evidence of acute cervical spine fracture or subluxation. Degenerative spondylosis, as described above. Osteopenia.   Electronically Signed   By: Earle Gell M.D.   On: 03/16/2015 21:28    Scheduled Meds: . albuterol  2.5 mg Nebulization QID  . allopurinol  100 mg Oral BID  . apixaban  5 mg Oral BID  . budesonide  0.25 mg Nebulization Daily  . furosemide  40 mg Intravenous BID  . guaiFENesin  600 mg Oral BID  . insulin aspart  0-15 Units Subcutaneous TID WC  . insulin aspart  0-5 Units Subcutaneous QHS  . insulin glargine  20 Units Subcutaneous BID  . isosorbide mononitrate  30 mg Oral Daily  . levothyroxine  25 mcg Oral Once per day on Mon Wed Fri  . levothyroxine  50 mcg Oral Once per day on Sun Tue Thu Sat  . methocarbamol  500 mg Oral QHS  . methylPREDNISolone (SOLU-MEDROL) injection  40 mg Intravenous Q12H  . metoprolol  50 mg Oral BID  . oxybutynin  10 mg Oral Daily  . sodium chloride  3 mL Intravenous Q12H   Continuous Infusions:   Principal Problem:   Acute on chronic diastolic heart failure Active Problems:   Hypothyroidism   Obstructive sleep apnea   Atrial fibrillation   COPD (chronic obstructive pulmonary  disease)   Secondary pulmonary hypertension   Overactive bladder   Type 2 diabetes mellitus with other circulatory complications    Time spent: 35 minutes.     Niel Hummer A  [facility] Pager [phone]. If 7PM-7AM, please contact night-coverage at [email or web address], password [email or web address] 03/18/2015, 5:06 PM  LOS: 2 days

## 2015-03-18 NOTE — Plan of Care (Signed)
Problem: Phase I Progression Outcomes Goal: EF % per last Echo/documented,Core Reminder form on chart Outcome: Completed/Met Date Met:  03/18/15 EF 60-65%(03-17-15

## 2015-03-18 NOTE — Progress Notes (Signed)
Inpatient Diabetes Program Recommendations  AACE/ADA: New Consensus Statement on Inpatient Glycemic Control (2013)  Target Ranges:  Prepandial:   less than 140 mg/dL      Peak postprandial:   less than 180 mg/dL (1-2 hours)      Critically ill patients:  140 - 180 mg/dL  Results for [name], [name] (MRN [id number]) as of 03/18/2015 10:53  Ref. Range 03/17/2015 06:56 03/17/2015 11:33 03/17/2015 16:14 03/17/2015 21:15 03/18/2015 06:38  Glucose-Capillary Latest Ref Range: 65-99 mg/dL 200 (H) 194 (H) 218 (H) 261 (H) 251 (H)   Inpatient Diabetes Program Recommendations Insulin - Basal: Increase Lantus at HS to 35 units  Thank you  Raoul Pitch BSN, RN,CDE Inpatient Diabetes Coordinator [phone] (team pager)

## 2015-03-18 NOTE — Consult Note (Signed)
Cardiologist:  Irish Lack Reason for Consult: CHF Referring Physician: [name] is an 81 y.o. female.  HPI:   The patient is 81 y.o. female with known coronary artery disease and paroxysmal atrial fibrillation(additional history below).  She was previously treated with sotalol and amiodarone.  The first was stopped due to renal insufficiency and the latter because of nausea.  She has chronic lung disease and requires oxygen. She had a repeat catheterization in Jan 2012 after her stents a few years ago and her LAD was widely patent.  Her echo on 12/06/14 revealed an EF was 55-60%, moderate LVH; moderate LAE; mild AI; mild TR.  RV function normal.    She was admitted with SOB and also had fallen after loosing her balance.  She did not lose consciousness.   She reports being treated for PNA over the last month with abx.  Her SOB was getting worse along with LEE, cough, congestion.  She was sleeping on two pillows.  Her weight last Oct was 173 and today she is 185.  BNP 1109.  The echo from yesterday revealed a substantial change in her RV function. The RA and RV are now severely dilated.  Moderate TR.  Peak PA pressure is 5mHg.   CT head and neck: No acute intracranial abnormality. Mild to moderate chronic small vessel disease.  Net fluids are -0.9L.    SCr worse compared to April.   The patient currently denies nausea, vomiting, fever, chest pain, dizziness, abdominal pain, hematochezia, melena, claudication.   Past Medical History  Diagnosis Date  . CAD (coronary artery disease)   . CHF (congestive heart failure)   . Paroxysmal atrial fibrillation   . Bradycardia   . Hypertension   . Renal artery stenosis   . COPD (chronic obstructive pulmonary disease)   . Obesity hypoventilation syndrome   . Pruritus   . Tracheobronchitis   . Acute rhinosinusitis   . Anemia   . Hypoxemia   . Peripheral edema   . Obesity, endogenous   . Diabetes mellitus   . Acid reflux  disease   . Osteoarthritis, generalized   . Hypothyroidism   . Lung nodule     lingula  . OSA (obstructive sleep apnea)     NPSG 04/03/00--AHI 28.hr  . DVT (deep venous thrombosis) 2003    left leg  . Vaginal atrophy   . Hx of colonic polyps   . Acute diastolic heart failure   . Secondary pulmonary hypertension 06/09/2013  . Blood transfusion without reported diagnosis 2009    had internal bleeding  . Cancer 1959    ovarian cancer  . Osteoporosis     Past Surgical History  Procedure Laterality Date  . Lumbar spine surgery    . Cholecystectomy    . Hemorroidectomy    . Repair wound fistula    . Right shoulder    . Elbow surgery    . Dilation and curettage of uterus    . Breast surgery  1998    lumpectomy  . Oophorectomy  1959    BSO-? ovarian cancer  . Bilateral salpingoophorectomy  1959    -d/t ovarian cancer  . Rotator cuff repair Right     Family History  Problem Relation Age of Onset  . Pneumonia Mother   . Heart disease Mother   . Allergies Mother   . Emphysema Mother   . Arthritis Mother   . Hypertension Mother   . Heart attack Father   .  Hypertension Father   . Diabetes Sister   . Heart disease Sister   . Diabetes Brother   . Diabetes      grandfather  . Breast cancer      aunt  . Bone cancer      aunt  . Clotting disorder Brother   . Arthritis Sister   . Breast cancer Maternal Aunt     Social History:  reports that she quit smoking about 36 years ago. She has never used smokeless tobacco. She reports that she does not drink alcohol or use illicit drugs.  Allergies:  Allergies  Allergen Reactions  . Cefuroxime Axetil Shortness Of Breath, Swelling and Rash    Throat swelling  . Cephalexin Shortness Of Breath, Swelling and Rash    Throat swelling  . Clarithromycin Shortness Of Breath, Swelling and Rash    Throat swelling  . Doxycycline Shortness Of Breath, Swelling and Rash     rash on legs and feet, throat swelling  . Erythromycin  Shortness Of Breath, Swelling and Rash    Throat swelling  . Tetracycline Shortness Of Breath, Swelling and Rash    Throat swelling    Medications: Scheduled Meds: . albuterol  2.5 mg Nebulization QID  . allopurinol  100 mg Oral BID  . apixaban  5 mg Oral BID  . budesonide  0.25 mg Nebulization Daily  . guaiFENesin  600 mg Oral BID  . insulin aspart  0-15 Units Subcutaneous TID WC  . insulin aspart  0-5 Units Subcutaneous QHS  . insulin glargine  20 Units Subcutaneous BID  . isosorbide mononitrate  30 mg Oral Daily  . levothyroxine  25 mcg Oral Once per day on Mon Wed Fri  . levothyroxine  50 mcg Oral Once per day on Sun Tue Thu Sat  . methocarbamol  500 mg Oral QHS  . methylPREDNISolone (SOLU-MEDROL) injection  40 mg Intravenous Q12H  . metoprolol  50 mg Oral BID  . oxybutynin  10 mg Oral Daily  . sodium chloride  3 mL Intravenous Q12H   Continuous Infusions:  PRN Meds:.sodium chloride, acetaminophen, ondansetron (ZOFRAN) IV, sodium chloride, traMADol   Results for orders placed or performed during the hospital encounter of 03/16/15 (from the past 48 hour(s))  Basic metabolic panel     Status: Abnormal   Collection Time: 03/16/15  7:24 PM  Result Value Ref Range   Sodium 139 135 - 145 mmol/L   Potassium 3.8 3.5 - 5.1 mmol/L   Chloride 100 (L) 101 - 111 mmol/L   CO2 29 22 - 32 mmol/L   Glucose, Bld 307 (H) 65 - 99 mg/dL   BUN 28 (H) 6 - 20 mg/dL   Creatinine, Ser 1.86 (H) 0.44 - 1.00 mg/dL   Calcium 9.3 8.9 - 10.3 mg/dL   GFR calc non Af Amer 25 (L) >60 mL/min   GFR calc Af Amer 29 (L) >60 mL/min    Comment: (NOTE) The eGFR has been calculated using the CKD EPI equation. This calculation has not been validated in all clinical situations. eGFR's persistently <60 mL/min signify possible Chronic Kidney Disease.    Anion gap 10 5 - 15  CBC     Status: None   Collection Time: 03/16/15  7:24 PM  Result Value Ref Range   WBC 6.1 4.0 - 10.5 K/uL   RBC 4.27 3.87 - 5.11  MIL/uL   Hemoglobin 12.9 12.0 - 15.0 g/dL   HCT 41.0 36.0 - 46.0 %   MCV  96.0 78.0 - 100.0 fL   MCH 30.2 26.0 - 34.0 pg   MCHC 31.5 30.0 - 36.0 g/dL   RDW 15.4 11.5 - 15.5 %   Platelets 166 150 - 400 K/uL  Brain natriuretic peptide     Status: Abnormal   Collection Time: 03/16/15  7:24 PM  Result Value Ref Range   B Natriuretic Peptide 1109.9 (H) 0.0 - 100.0 pg/mL  Protime-INR     Status: Abnormal   Collection Time: 03/16/15  7:26 PM  Result Value Ref Range   Prothrombin Time 19.3 (H) 11.6 - 15.2 seconds   INR 1.63 (H) 0.00 - 1.49  Troponin I     Status: None   Collection Time: 03/16/15  7:44 PM  Result Value Ref Range   Troponin I <0.03 <0.031 ng/mL    Comment:        NO INDICATION OF MYOCARDIAL INJURY.   Urinalysis, Routine w reflex microscopic (not at [facility])     Status: Abnormal   Collection Time: 03/16/15  7:58 PM  Result Value Ref Range   Color, Urine YELLOW YELLOW   APPearance CLEAR CLEAR   Specific Gravity, Urine 1.013 1.005 - 1.030   pH 6.0 5.0 - 8.0   Glucose, UA 250 (A) NEGATIVE mg/dL   Hgb urine dipstick SMALL (A) NEGATIVE   Bilirubin Urine NEGATIVE NEGATIVE   Ketones, ur NEGATIVE NEGATIVE mg/dL   Protein, ur >300 (A) NEGATIVE mg/dL   Urobilinogen, UA 0.2 0.0 - 1.0 mg/dL   Nitrite NEGATIVE NEGATIVE   Leukocytes, UA NEGATIVE NEGATIVE  Urine microscopic-add on     Status: None   Collection Time: 03/16/15  7:58 PM  Result Value Ref Range   Squamous Epithelial / LPF RARE RARE   WBC, UA 0-2 <3 WBC/hpf   RBC / HPF 0-2 <3 RBC/hpf   Bacteria, UA RARE RARE  CBG monitoring, ED     Status: Abnormal   Collection Time: 03/16/15  8:02 PM  Result Value Ref Range   Glucose-Capillary 251 (H) 65 - 99 mg/dL  Glucose, capillary     Status: Abnormal   Collection Time: 03/17/15  2:57 AM  Result Value Ref Range   Glucose-Capillary 312 (H) 65 - 99 mg/dL  Basic metabolic panel     Status: Abnormal   Collection Time: 03/17/15  4:59 AM  Result Value Ref Range   Sodium 139 135  - 145 mmol/L   Potassium 3.6 3.5 - 5.1 mmol/L   Chloride 99 (L) 101 - 111 mmol/L   CO2 32 22 - 32 mmol/L   Glucose, Bld 257 (H) 65 - 99 mg/dL   BUN 24 (H) 6 - 20 mg/dL   Creatinine, Ser 1.74 (H) 0.44 - 1.00 mg/dL   Calcium 8.8 (L) 8.9 - 10.3 mg/dL   GFR calc non Af Amer 27 (L) >60 mL/min   GFR calc Af Amer 31 (L) >60 mL/min    Comment: (NOTE) The eGFR has been calculated using the CKD EPI equation. This calculation has not been validated in all clinical situations. eGFR's persistently <60 mL/min signify possible Chronic Kidney Disease.    Anion gap 8 5 - 15  Hemoglobin A1c     Status: Abnormal   Collection Time: 03/17/15  4:59 AM  Result Value Ref Range   Hgb A1c MFr Bld 9.5 (H) 4.8 - 5.6 %    Comment: (NOTE)         Pre-diabetes: 5.7 - 6.4  Diabetes: >6.4         Glycemic control for adults with diabetes: <7.0    Mean Plasma Glucose 226 mg/dL    Comment: (NOTE) Performed At: [facility] [place], [place] [id number] Lindon Romp MD IO:[id number]   CBC with Differential/Platelet     Status: Abnormal   Collection Time: 03/17/15  6:45 AM  Result Value Ref Range   WBC 7.2 4.0 - 10.5 K/uL   RBC 3.83 (L) 3.87 - 5.11 MIL/uL   Hemoglobin 11.6 (L) 12.0 - 15.0 g/dL   HCT 37.3 36.0 - 46.0 %   MCV 97.4 78.0 - 100.0 fL   MCH 30.3 26.0 - 34.0 pg   MCHC 31.1 30.0 - 36.0 g/dL   RDW 15.4 11.5 - 15.5 %   Platelets 142 (L) 150 - 400 K/uL   Neutrophils Relative % 74 43 - 77 %   Neutro Abs 5.3 1.7 - 7.7 K/uL   Lymphocytes Relative 16 12 - 46 %   Lymphs Abs 1.2 0.7 - 4.0 K/uL   Monocytes Relative 7 3 - 12 %   Monocytes Absolute 0.5 0.1 - 1.0 K/uL   Eosinophils Relative 3 0 - 5 %   Eosinophils Absolute 0.2 0.0 - 0.7 K/uL   Basophils Relative 0 0 - 1 %   Basophils Absolute 0.0 0.0 - 0.1 K/uL  Troponin I (q 6hr x 3)     Status: None   Collection Time: 03/17/15  6:45 AM  Result Value Ref Range   Troponin I <0.03 <0.031 ng/mL    Comment:        NO  INDICATION OF MYOCARDIAL INJURY.   Glucose, capillary     Status: Abnormal   Collection Time: 03/17/15  6:56 AM  Result Value Ref Range   Glucose-Capillary 200 (H) 65 - 99 mg/dL  Glucose, capillary     Status: Abnormal   Collection Time: 03/17/15 11:33 AM  Result Value Ref Range   Glucose-Capillary 194 (H) 65 - 99 mg/dL  Glucose, capillary     Status: Abnormal   Collection Time: 03/17/15  4:14 PM  Result Value Ref Range   Glucose-Capillary 218 (H) 65 - 99 mg/dL  Troponin I (q 6hr x 3)     Status: None   Collection Time: 03/17/15  4:40 PM  Result Value Ref Range   Troponin I <0.03 <0.031 ng/mL    Comment:        NO INDICATION OF MYOCARDIAL INJURY.   Troponin I (q 6hr x 3)     Status: None   Collection Time: 03/17/15  5:29 PM  Result Value Ref Range   Troponin I <0.03 <0.031 ng/mL    Comment:        NO INDICATION OF MYOCARDIAL INJURY.   Glucose, capillary     Status: Abnormal   Collection Time: 03/17/15  9:15 PM  Result Value Ref Range   Glucose-Capillary 261 (H) 65 - 99 mg/dL   Comment 1 Notify RN    Comment 2 Document in Chart   Basic metabolic panel     Status: Abnormal   Collection Time: 03/18/15  4:43 AM  Result Value Ref Range   Sodium 134 (L) 135 - 145 mmol/L   Potassium 4.1 3.5 - 5.1 mmol/L   Chloride 95 (L) 101 - 111 mmol/L   CO2 32 22 - 32 mmol/L   Glucose, Bld 260 (H) 65 - 99 mg/dL   BUN 32 (H) 6 - 20  mg/dL   Creatinine, Ser 2.00 (H) 0.44 - 1.00 mg/dL   Calcium 8.5 (L) 8.9 - 10.3 mg/dL   GFR calc non Af Amer 23 (L) >60 mL/min   GFR calc Af Amer 26 (L) >60 mL/min    Comment: (NOTE) The eGFR has been calculated using the CKD EPI equation. This calculation has not been validated in all clinical situations. eGFR's persistently <60 mL/min signify possible Chronic Kidney Disease.    Anion gap 7 5 - 15  CBC     Status: Abnormal   Collection Time: 03/18/15  4:43 AM  Result Value Ref Range   WBC 6.5 4.0 - 10.5 K/uL   RBC 4.00 3.87 - 5.11 MIL/uL    Hemoglobin 11.9 (L) 12.0 - 15.0 g/dL   HCT 38.7 36.0 - 46.0 %   MCV 96.8 78.0 - 100.0 fL   MCH 29.8 26.0 - 34.0 pg   MCHC 30.7 30.0 - 36.0 g/dL   RDW 15.5 11.5 - 15.5 %   Platelets 149 (L) 150 - 400 K/uL  Glucose, capillary     Status: Abnormal   Collection Time: 03/18/15  6:38 AM  Result Value Ref Range   Glucose-Capillary 251 (H) 65 - 99 mg/dL  Glucose, capillary     Status: Abnormal   Collection Time: 03/18/15 11:21 AM  Result Value Ref Range   Glucose-Capillary 207 (H) 65 - 99 mg/dL   Comment 1 Notify RN     Dg Chest 2 View  03/18/2015   CLINICAL DATA:  Cough and nausea for 2 months, dyspnea, coronary artery disease, CHF, atrial fibrillation, COPD, diabetes mellitus, coronary artery disease, former smoker  EXAM: CHEST  2 VIEW  COMPARISON:  03/16/2015  FINDINGS: Enlargement of cardiac silhouette with vascular congestion.  Atherosclerotic calcification aorta.  Peribronchial thickening with chronic accentuation of interstitial markings little changed.  Mitral annular calcification noted.  Bibasilar atelectasis.  No pulmonary infiltrate, pleural effusion or pneumothorax.  Osseous demineralization with compression deformity of a vertebral body at the thoracolumbar junction question T12 unchanged.  RIGHT shoulder prosthesis with LEFT glenohumeral degenerative changes noted.  Surgical clips RIGHT upper quadrant likely prior cholecystectomy.  IMPRESSION: Chronic bronchitic and interstitial changes with bibasilar atelectasis.  Enlargement of cardiac silhouette with pulmonary vascular congestion.   Electronically Signed   By: Lavonia Dana M.D.   On: 03/18/2015 10:14   Dg Chest 2 View  03/16/2015   CLINICAL DATA:  Fall today. Left sided chest pain and shortness of breath. Initial encounter. COPD and congestive heart failure.  EXAM: CHEST  2 VIEW  COMPARISON:  Seven hundred fourteen  FINDINGS: Mild to moderate cardiomegaly remains stable. Both lungs are clear. No evidence of pulmonary infiltrate or edema.  No evidence of pneumothorax or pleural effusion. Right shoulder prosthesis again noted.  IMPRESSION: Stable cardiomegaly.  No active lung disease.   Electronically Signed   By: Earle Gell M.D.   On: 03/16/2015 20:45   Ct Head Wo Contrast  03/16/2015   CLINICAL DATA:  Tripped on carpet. Hit head on site of table. Headache, scalp hematoma, and neck pain.  EXAM: CT HEAD WITHOUT CONTRAST  CT CERVICAL SPINE WITHOUT CONTRAST  TECHNIQUE: Multidetector CT imaging of the head and cervical spine was performed following the standard protocol without intravenous contrast. Multiplanar CT image reconstructions of the cervical spine were also generated.  COMPARISON:  Head CT on 12/05/2014 and cervical spine CT on 12/31/2009  FINDINGS: CT HEAD FINDINGS  There is no evidence of intracranial hemorrhage,  brain edema, or other signs of acute infarction. There is no evidence of intracranial mass lesion or mass effect. No abnormal extraaxial fluid collections are identified.  Mild to moderate chronic small vessel disease noted. No evidence of hydrocephalus. No evidence of skull fracture or pneumocephalus.  CT CERVICAL SPINE FINDINGS  No evidence of acute fracture, subluxation, or prevertebral soft tissue swelling.  Cervical degenerative disc disease is seen at most levels which is most pronounced at C4-5, C5-6, and C6-7. Mild to moderate facet DJD is also seen bilaterally at these levels. Atlantoaxial degenerative changes also noted. Generalized osteopenia demonstrated.  IMPRESSION: No acute intracranial abnormality. Mild to moderate chronic small vessel disease.  No evidence of acute cervical spine fracture or subluxation. Degenerative spondylosis, as described above. Osteopenia.   Electronically Signed   By: Earle Gell M.D.   On: 03/16/2015 21:28   Ct Cervical Spine Wo Contrast  03/16/2015   CLINICAL DATA:  Tripped on carpet. Hit head on site of table. Headache, scalp hematoma, and neck pain.  EXAM: CT HEAD WITHOUT CONTRAST  CT  CERVICAL SPINE WITHOUT CONTRAST  TECHNIQUE: Multidetector CT imaging of the head and cervical spine was performed following the standard protocol without intravenous contrast. Multiplanar CT image reconstructions of the cervical spine were also generated.  COMPARISON:  Head CT on 12/05/2014 and cervical spine CT on 12/31/2009  FINDINGS: CT HEAD FINDINGS  There is no evidence of intracranial hemorrhage, brain edema, or other signs of acute infarction. There is no evidence of intracranial mass lesion or mass effect. No abnormal extraaxial fluid collections are identified.  Mild to moderate chronic small vessel disease noted. No evidence of hydrocephalus. No evidence of skull fracture or pneumocephalus.  CT CERVICAL SPINE FINDINGS  No evidence of acute fracture, subluxation, or prevertebral soft tissue swelling.  Cervical degenerative disc disease is seen at most levels which is most pronounced at C4-5, C5-6, and C6-7. Mild to moderate facet DJD is also seen bilaterally at these levels. Atlantoaxial degenerative changes also noted. Generalized osteopenia demonstrated.  IMPRESSION: No acute intracranial abnormality. Mild to moderate chronic small vessel disease.  No evidence of acute cervical spine fracture or subluxation. Degenerative spondylosis, as described above. Osteopenia.   Electronically Signed   By: Earle Gell M.D.   On: 03/16/2015 21:28    Review of Systems  Constitutional: Negative for fever and diaphoresis.  HENT: Negative for sore throat.   Respiratory: Positive for cough and shortness of breath.   Cardiovascular: Positive for orthopnea, leg swelling and PND. Negative for chest pain and palpitations.  Gastrointestinal: Positive for nausea. Negative for vomiting, abdominal pain, blood in stool and melena.  Musculoskeletal: Negative for myalgias.  Neurological: Negative for dizziness.  All other systems reviewed and are negative.  Blood pressure 109/58, pulse 74, temperature 97.3 F (36.3 C),  temperature source Oral, resp. rate 20, height '5\' 1"'  (1.549 m), weight 185 lb (83.915 kg), last menstrual period 08/13/1957, SpO2 93 %. Physical Exam  Nursing note and vitals reviewed. Constitutional: She is oriented to person, place, and time. She appears well-developed. No distress.  HENT:  Head: Normocephalic.  Eyes: EOM are normal. Pupils are equal, round, and reactive to light. No scleral icterus.  Neck: Normal range of motion. Neck supple. JVD present.  JV is extremely dilated.    Cardiovascular: S1 normal and S2 normal.  An irregular rhythm present. Bradycardia present.   No murmur heard. Pulses:      Radial pulses are 2+ on the right side, and  2+ on the left side.  Respiratory: Effort normal and breath sounds normal. She has no wheezes. She has no rales.  GI: Soft. Bowel sounds are normal. She exhibits no distension. There is no tenderness.  Musculoskeletal: She exhibits edema.  Lymphadenopathy:    She has no cervical adenopathy.  Neurological: She is alert and oriented to person, place, and time. She exhibits normal muscle tone.  Skin: Skin is warm and dry.  Psychiatric: She has a normal mood and affect.    Assessment/Plan: Principal Problem:   Acute on chronic diastolic heart failure Active Problems:   Hypothyroidism   Obstructive sleep apnea   Atrial fibrillation   COPD (chronic obstructive pulmonary disease)   Secondary pulmonary hypertension   Overactive bladder   Type 2 diabetes mellitus with other circulatory complications   Severe pulmonary HTN   Cor pulmonale   The patient has severe pulmonary HTN and right heart failure.  Her JV is severely dilated.  CXR today with pulmonary vascular congestion.  She will need continued diuresis despite her increasing SCr.  A right heart cath is need to measure pressures. Treat OSA.  She will need a consult from [facility] team.      [name], [facility] 03/18/2015, 2:12 PM   I have personally seen and examined this patient  with Terri Fuller, PA-C. I agree with the assessment and plan as outlined above. She is admitted with acute CHF. She has COPD and OSA and now right sided heart failure symptoms. She will need right heart cath next week. I think we should probably get the CHF team involved next week. Would continue IV Lasix today and follow renal function closely. Would encourage her to wear CPAP tonight. We will follow along.   [name] 03/18/2015 3:38 PM

## 2015-03-18 NOTE — Progress Notes (Signed)
HR drops to 37 then comes right back up to the 50's while pt is sleeping, notified MD, will continue to monitor, thanks Arvella Nigh   RN

## 2015-03-19 DIAGNOSIS — I25118 Atherosclerotic heart disease of native coronary artery with other forms of angina pectoris: Secondary | ICD-10-CM

## 2015-03-19 DIAGNOSIS — J449 Chronic obstructive pulmonary disease, unspecified: Secondary | ICD-10-CM

## 2015-03-19 DIAGNOSIS — I481 Persistent atrial fibrillation: Secondary | ICD-10-CM

## 2015-03-19 DIAGNOSIS — I272 Other secondary pulmonary hypertension: Secondary | ICD-10-CM

## 2015-03-19 LAB — URINE MICROSCOPIC-ADD ON

## 2015-03-19 LAB — URINALYSIS, ROUTINE W REFLEX MICROSCOPIC
BILIRUBIN URINE: NEGATIVE
GLUCOSE, UA: 500 mg/dL — AB
Hgb urine dipstick: NEGATIVE
Ketones, ur: NEGATIVE mg/dL
Leukocytes, UA: NEGATIVE
Nitrite: NEGATIVE
PROTEIN: 100 mg/dL — AB
Specific Gravity, Urine: 1.016 (ref 1.005–1.030)
UROBILINOGEN UA: 0.2 mg/dL (ref 0.0–1.0)
pH: 5 (ref 5.0–8.0)

## 2015-03-19 LAB — BASIC METABOLIC PANEL
Anion gap: 14 (ref 5–15)
BUN: 49 mg/dL — ABNORMAL HIGH (ref 6–20)
CHLORIDE: 88 mmol/L — AB (ref 101–111)
CO2: 26 mmol/L (ref 22–32)
Calcium: 8.1 mg/dL — ABNORMAL LOW (ref 8.9–10.3)
Creatinine, Ser: 2.74 mg/dL — ABNORMAL HIGH (ref 0.44–1.00)
GFR calc Af Amer: 18 mL/min — ABNORMAL LOW (ref >60)
GFR calc non Af Amer: 16 mL/min — ABNORMAL LOW (ref >60)
Glucose, Bld: 454 mg/dL — ABNORMAL HIGH (ref 65–99)
Potassium: 4.6 mmol/L (ref 3.5–5.1)
SODIUM: 128 mmol/L — AB (ref 135–145)

## 2015-03-19 LAB — GLUCOSE, CAPILLARY
Glucose-Capillary: 447 mg/dL — ABNORMAL HIGH (ref 65–99)
Glucose-Capillary: 485 mg/dL — ABNORMAL HIGH (ref 65–99)
Glucose-Capillary: 293 mg/dL — ABNORMAL HIGH (ref 65–99)
Glucose-Capillary: 460 mg/dL — ABNORMAL HIGH (ref 65–99)

## 2015-03-19 LAB — CBC
HCT: 35.9 % — ABNORMAL LOW (ref 36.0–46.0)
Hemoglobin: 11.4 g/dL — ABNORMAL LOW (ref 12.0–15.0)
MCH: 30.2 pg (ref 26.0–34.0)
MCHC: 31.8 g/dL (ref 30.0–36.0)
MCV: 95 fL (ref 78.0–100.0)
Platelets: 145 10*3/uL — ABNORMAL LOW (ref 150–400)
RBC: 3.78 MIL/uL — AB (ref 3.87–5.11)
RDW: 15.2 % (ref 11.5–15.5)
WBC: 6.1 10*3/uL (ref 4.0–10.5)

## 2015-03-19 MED ORDER — SENNOSIDES-DOCUSATE SODIUM 8.6-50 MG PO TABS
1.0000 | ORAL_TABLET | Freq: Two times a day (BID) | ORAL | Status: DC
  Administered 2015-03-19 – 2015-03-25 (×12): 1 via ORAL
  Filled 2015-03-19 (×13): qty 1

## 2015-03-19 MED ORDER — INSULIN ASPART 100 UNIT/ML ~~LOC~~ SOLN
7.0000 [IU] | Freq: Once | SUBCUTANEOUS | Status: AC
  Administered 2015-03-19: 7 [IU] via SUBCUTANEOUS

## 2015-03-19 MED ORDER — METHYLPREDNISOLONE SODIUM SUCC 40 MG IJ SOLR
40.0000 mg | Freq: Every day | INTRAMUSCULAR | Status: DC
  Administered 2015-03-20: 40 mg via INTRAVENOUS
  Filled 2015-03-19: qty 1

## 2015-03-19 MED ORDER — POLYETHYLENE GLYCOL 3350 17 G PO PACK
17.0000 g | PACK | Freq: Two times a day (BID) | ORAL | Status: DC
  Administered 2015-03-19: 17 g via ORAL
  Filled 2015-03-19 (×2): qty 1

## 2015-03-19 MED ORDER — METOLAZONE 2.5 MG PO TABS
2.5000 mg | ORAL_TABLET | Freq: Once | ORAL | Status: AC
  Administered 2015-03-19: 2.5 mg via ORAL
  Filled 2015-03-19: qty 1

## 2015-03-19 NOTE — Progress Notes (Signed)
[facility] PROGRESS NOTE  [name] MVH:[id number] DOB: Nov 15, 1933 DOA: 03/16/2015 PCP: Cari Caraway, MD  Assessment/Plan: 1. Acute on chronic diastolic heart failure: The patient is presenting with progressive worsening shortness of breath as well as orthopnea and PND. Chest shows mild vascular congestion. elevation of serum BNP. Monitor ins and outs. Continue with lasix 40 mg IV BID.  Weight: -79---82--85 Cardiology consulted, Chest x ray with vascular congestion, raising Cr.  Weight going up, cr increasing. Will follow cardiology recommendation.  Plan for right side heart cath on Monday.   2.Atrial fibrillation. Holding Apixaban on 8-7 anticipating cath 8-8 HR drop to 37 8-5. She received 360- Cardizem  BID 8-04. I reviewed meds with patient she take 180 mg BID of Cardizem. If she get 360 tablet she only takes once a day.  Holder parameter for metoprolol.  If HR increased will resume Cardizem 180 mg BID.   3.Chronic hypoxia with COPD and OSA. Continue close monitoring. Schedule nebulizer.  Change solumedrol to daily.   4.Hypothyroidism. Continue Synthroid.  5.Overactive bladder. Continue home medication.  6. Diabetes mellitus. Continue sliding scale insulin and  Lantus Taper steroids.   7-AKI: CKD stage III; monitor on IV lasix.    Code Status: Full Code.  Family Communication: care discussed with patient.  Disposition Plan: Remain inpatient, PT evaluation.    Consultants:  none  Procedures:  ECHO; Left ventricle: The cavity size was normal. There was mild concentric hypertrophy. Systolic function was normal. The estimated ejection fraction was in the range of 60% to 65%. Wall motion was normal; there were no regional wall motion abnormalities. - Aortic valve: There was trivial regurgitation. - Mitral valve: Severely calcified annulus. - Left atrium: The atrium was moderately dilated. - Right ventricle: The cavity size was  severely dilated. Systolic function was moderately reduced. - Right atrium: The atrium was severely dilated. - Tricuspid valve: There was moderate regurgitation. - Pulmonary arteries: Systolic pressure was severely increased. PA peak pressure: 68 mm Hg (S).  Antibiotics:  none  HPI/Subjective: She is still coughing, breathing better  Objective: Filed Vitals:   03/19/15 1046  BP: 112/58  Pulse: 71  Temp:   Resp:     Intake/Output Summary (Last 24 hours) at 03/19/15 1440 Last data filed at 03/19/15 1412  Gross per 24 hour  Intake    940 ml  Output    875 ml  Net     65 ml   Filed Weights   03/17/15 0159 03/18/15 0635 03/19/15 0256  Weight: 82.1 kg (181 lb) 83.915 kg (185 lb) 85.775 kg (189 lb 1.6 oz)    Exam:   General:  Alert in no distress.   Cardiovascular: S 1, S 2 RRR  Respiratory: Bilateral crackles.   Abdomen: bs presents, soft, nt  Musculoskeletal: bilateral LE edema/   Data Reviewed: Basic Metabolic Panel:  Recent Labs Lab 03/16/15 1924 03/17/15 0459 03/18/15 0443 03/19/15 0345  NA 139 139 134* 128*  K 3.8 3.6 4.1 4.6  CL 100* 99* 95* 88*  CO2 29 32 32 26  GLUCOSE 307* 257* 260* 454*  BUN 28* 24* 32* 49*  CREATININE 1.86* 1.74* 2.00* 2.74*  CALCIUM 9.3 8.8* 8.5* 8.1*   Liver Function Tests: No results for input(s): AST, ALT, ALKPHOS, BILITOT, PROT, ALBUMIN in the last 168 hours. No results for input(s): LIPASE, AMYLASE in the last 168 hours. No results for input(s): AMMONIA in the last 168 hours. CBC:  Recent Labs Lab 03/16/15 1924 03/17/15 0645 03/18/15  0443 03/19/15 0345  WBC 6.1 7.2 6.5 6.1  NEUTROABS  --  5.3  --   --   HGB 12.9 11.6* 11.9* 11.4*  HCT 41.0 37.3 38.7 35.9*  MCV 96.0 97.4 96.8 95.0  PLT 166 142* 149* 145*   Cardiac Enzymes:  Recent Labs Lab 03/16/15 1944 03/17/15 0645 03/17/15 1640 03/17/15 1729  TROPONINI <0.03 <0.03 <0.03 <0.03   BNP (last 3 results)  Recent Labs  12/05/14 2250  03/16/15 1924  BNP 1143.9* 1109.9*    ProBNP (last 3 results) No results for input(s): PROBNP in the last 8760 hours.  CBG:  Recent Labs Lab 03/18/15 1121 03/18/15 1615 03/18/15 2154 03/19/15 0706 03/19/15 1233  GLUCAP 207* 258* 377* 447* 485*    No results found for this or any previous visit (from the past 240 hour(s)).   Studies: Dg Chest 2 View  03/18/2015   CLINICAL DATA:  Cough and nausea for 2 months, dyspnea, coronary artery disease, CHF, atrial fibrillation, COPD, diabetes mellitus, coronary artery disease, former smoker  EXAM: CHEST  2 VIEW  COMPARISON:  03/16/2015  FINDINGS: Enlargement of cardiac silhouette with vascular congestion.  Atherosclerotic calcification aorta.  Peribronchial thickening with chronic accentuation of interstitial markings little changed.  Mitral annular calcification noted.  Bibasilar atelectasis.  No pulmonary infiltrate, pleural effusion or pneumothorax.  Osseous demineralization with compression deformity of a vertebral body at the thoracolumbar junction question T12 unchanged.  RIGHT shoulder prosthesis with LEFT glenohumeral degenerative changes noted.  Surgical clips RIGHT upper quadrant likely prior cholecystectomy.  IMPRESSION: Chronic bronchitic and interstitial changes with bibasilar atelectasis.  Enlargement of cardiac silhouette with pulmonary vascular congestion.   Electronically Signed   By: Lavonia Dana M.D.   On: 03/18/2015 10:14    Scheduled Meds: . albuterol  2.5 mg Nebulization QID  . allopurinol  100 mg Oral BID  . apixaban  5 mg Oral BID  . budesonide  0.25 mg Nebulization Daily  . furosemide  40 mg Intravenous BID  . guaiFENesin  600 mg Oral BID  . insulin aspart  0-15 Units Subcutaneous TID WC  . insulin aspart  0-5 Units Subcutaneous QHS  . insulin glargine  20 Units Subcutaneous BID  . isosorbide mononitrate  30 mg Oral Daily  . levothyroxine  25 mcg Oral Once per day on Mon Wed Fri  . levothyroxine  50 mcg Oral Once  per day on Sun Tue Thu Sat  . methocarbamol  500 mg Oral QHS  . methylPREDNISolone (SOLU-MEDROL) injection  40 mg Intravenous Q12H  . metoprolol  50 mg Oral BID  . oxybutynin  10 mg Oral Daily  . polyethylene glycol  17 g Oral BID  . sodium chloride  3 mL Intravenous Q12H   Continuous Infusions:   Principal Problem:   Acute on chronic diastolic heart failure Active Problems:   Hypothyroidism   Obstructive sleep apnea   Atrial fibrillation   COPD (chronic obstructive pulmonary disease)   Secondary pulmonary hypertension   Overactive bladder   Type 2 diabetes mellitus with other circulatory complications    Time spent: 35 minutes.     Niel Hummer A  [facility] Pager [phone]. If 7PM-7AM, please contact night-coverage at [email or web address], password [facility] 03/19/2015, 2:40 PM  LOS: 3 days

## 2015-03-19 NOTE — Progress Notes (Signed)
MD notified about patient's CBG(447). Instructed to give 15 units of the novolog.

## 2015-03-19 NOTE — Progress Notes (Signed)
Patient Name: [name] Date of Encounter: 03/19/2015  Principal Problem:   Acute on chronic diastolic heart failure Active Problems:   Hypothyroidism   Obstructive sleep apnea   Atrial fibrillation   COPD (chronic obstructive pulmonary disease)   Secondary pulmonary hypertension   Overactive bladder   Type 2 diabetes mellitus with other circulatory complications   Length of Stay: 3  SUBJECTIVE  No meaningful diuresis. Weight has actually increased. Creatinine is worse. Some bradycardia yesterday, maybe related to confusion regarding her meds doses. Appropriate rate control today.  CURRENT MEDS . albuterol  2.5 mg Nebulization QID  . allopurinol  100 mg Oral BID  . apixaban  5 mg Oral BID  . budesonide  0.25 mg Nebulization Daily  . furosemide  40 mg Intravenous BID  . guaiFENesin  600 mg Oral BID  . insulin aspart  0-15 Units Subcutaneous TID WC  . insulin aspart  0-5 Units Subcutaneous QHS  . insulin glargine  20 Units Subcutaneous BID  . isosorbide mononitrate  30 mg Oral Daily  . levothyroxine  25 mcg Oral Once per day on Mon Wed Fri  . levothyroxine  50 mcg Oral Once per day on Sun Tue Thu Sat  . methocarbamol  500 mg Oral QHS  . methylPREDNISolone (SOLU-MEDROL) injection  40 mg Intravenous Q12H  . metoprolol  50 mg Oral BID  . oxybutynin  10 mg Oral Daily  . polyethylene glycol  17 g Oral BID  . sodium chloride  3 mL Intravenous Q12H    OBJECTIVE   Intake/Output Summary (Last 24 hours) at 03/19/15 1301 Last data filed at 03/19/15 1153  Gross per 24 hour  Intake    940 ml  Output    725 ml  Net    215 ml   Filed Weights   03/17/15 0159 03/18/15 0635 03/19/15 0256  Weight: 181 lb (82.1 kg) 185 lb (83.915 kg) 189 lb 1.6 oz (85.775 kg)    PHYSICAL EXAM Filed Vitals:   03/19/15 0256 03/19/15 0752 03/19/15 0755 03/19/15 1046  BP:  134/55  112/58  Pulse:  91  71  Temp:  98 F (36.7 C)    TempSrc:  Oral    Resp:  18    Height:      Weight:  189 lb 1.6 oz (85.775 kg)     SpO2:  97% 97% 97%   General: Alert, oriented x3, no distress Head: no evidence of trauma, PERRL, EOMI, no exophtalmos or lid lag, no myxedema, no xanthelasma; normal ears, nose and oropharynx Neck: 12-14 cm elevation in jugular venous pulsations and no additional hepatojugular reflux; brisk carotid pulses without delay and no carotid bruits Chest: clear to auscultation, no signs of consolidation by percussion or palpation, normal fremitus, symmetrical and full respiratory excursions Cardiovascular: normal position and quality of the apical impulse, irregular rhythm, normal first and loud second heart sounds, no rubs or gallops, 2/6 holosystolic murmur at LLSB Abdomen: no tenderness or distention, no masses by palpation, no abnormal pulsatility or arterial bruits, normal bowel sounds, no hepatosplenomegaly Extremities: no clubbing, cyanosis or edema; 2+ radial, ulnar and brachial pulses bilaterally; 2+ right femoral, posterior tibial and dorsalis pedis pulses; 2+ left femoral, posterior tibial and dorsalis pedis pulses; no subclavian or femoral bruits Neurological: grossly nonfocal  LABS  CBC  Recent Labs  03/17/15 0645 03/18/15 0443 03/19/15 0345  WBC 7.2 6.5 6.1  NEUTROABS 5.3  --   --   HGB 11.6* 11.9* 11.4*  HCT 37.3 38.7 35.9*  MCV 97.4 96.8 95.0  PLT 142* 149* 574*   Basic Metabolic Panel  Recent Labs  03/18/15 0443 03/19/15 0345  NA 134* 128*  K 4.1 4.6  CL 95* 88*  CO2 32 26  GLUCOSE 260* 454*  BUN 32* 49*  CREATININE 2.00* 2.74*  CALCIUM 8.5* 8.1*   Liver Function Tests No results for input(s): AST, ALT, ALKPHOS, BILITOT, PROT, ALBUMIN in the last 72 hours. No results for input(s): LIPASE, AMYLASE in the last 72 hours. Cardiac Enzymes  Recent Labs  03/17/15 0645 03/17/15 1640 03/17/15 1729  TROPONINI <0.03 <0.03 <0.03   BNP Invalid input(s): POCBNP D-Dimer No results for input(s): DDIMER in the last 72 hours. Hemoglobin  A1C  Recent Labs  03/17/15 0459  HGBA1C 9.5*   Fasting Lipid Panel No results for input(s): CHOL, HDL, LDLCALC, TRIG, CHOLHDL, LDLDIRECT in the last 72 hours. Thyroid Function Tests No results for input(s): TSH, T4TOTAL, T3FREE, THYROIDAB in the last 72 hours.  Invalid input(s): [place]  Radiology Studies Imaging results have been reviewed and Dg Chest 2 View  03/18/2015   CLINICAL DATA:  Cough and nausea for 2 months, dyspnea, coronary artery disease, CHF, atrial fibrillation, COPD, diabetes mellitus, coronary artery disease, former smoker  EXAM: CHEST  2 VIEW  COMPARISON:  03/16/2015  FINDINGS: Enlargement of cardiac silhouette with vascular congestion.  Atherosclerotic calcification aorta.  Peribronchial thickening with chronic accentuation of interstitial markings little changed.  Mitral annular calcification noted.  Bibasilar atelectasis.  No pulmonary infiltrate, pleural effusion or pneumothorax.  Osseous demineralization with compression deformity of a vertebral body at the thoracolumbar junction question T12 unchanged.  RIGHT shoulder prosthesis with LEFT glenohumeral degenerative changes noted.  Surgical clips RIGHT upper quadrant likely prior cholecystectomy.  IMPRESSION: Chronic bronchitic and interstitial changes with bibasilar atelectasis.  Enlargement of cardiac silhouette with pulmonary vascular congestion.   Electronically Signed   By: Lavonia Dana M.D.   On: 03/18/2015 10:14    TELE atrial fibrillation   ECG atrial fibrillation, minor IVCD, right axis  ASSESSMENT AND PLAN  Severe pulmonary HTN and secondary right heart failure with multiple possible causes. COPD, OSA, possible diastolic left heart failure (unable to evaluate on echo due to arrhythmia, suggested by elevated BNP). CTEP unlikely (Already on anticoagulant for atrial fibrillation). Plan right and left heart cath on Monday. Will hold Eliquis tomorrow. Limited cardiac output due to pulmonary HTN and RV failure are  likely the cause for failure to respond to diuretics and worsening renal function. Known CAD, but little to suggest this is playing a role in current illness. Acute on chronic renal insufficiency and worsening hyponatremia complicates the situation further. Add one dose of metolazone. Caution with renal function.   Sanda Klein, MD, [facility] [phone] office [phone] pager 03/19/2015 1:01 PM

## 2015-03-20 LAB — CBC
HCT: 37.8 % (ref 36.0–46.0)
Hemoglobin: 12.7 g/dL (ref 12.0–15.0)
MCH: 31.2 pg (ref 26.0–34.0)
MCHC: 33.6 g/dL (ref 30.0–36.0)
MCV: 92.9 fL (ref 78.0–100.0)
Platelets: 166 10*3/uL (ref 150–400)
RBC: 4.07 MIL/uL (ref 3.87–5.11)
RDW: 15.3 % (ref 11.5–15.5)
WBC: 10.8 10*3/uL — AB (ref 4.0–10.5)

## 2015-03-20 LAB — GLUCOSE, CAPILLARY
GLUCOSE-CAPILLARY: 306 mg/dL — AB (ref 65–99)
GLUCOSE-CAPILLARY: 252 mg/dL — AB (ref 65–99)
Glucose-Capillary: 371 mg/dL — ABNORMAL HIGH (ref 65–99)
Glucose-Capillary: 385 mg/dL — ABNORMAL HIGH (ref 65–99)
Glucose-Capillary: 386 mg/dL — ABNORMAL HIGH (ref 65–99)

## 2015-03-20 LAB — BASIC METABOLIC PANEL
Anion gap: 15 (ref 5–15)
BUN: 77 mg/dL — ABNORMAL HIGH (ref 6–20)
CALCIUM: 8.6 mg/dL — AB (ref 8.9–10.3)
CHLORIDE: 88 mmol/L — AB (ref 101–111)
CO2: 24 mmol/L (ref 22–32)
Creatinine, Ser: 2.43 mg/dL — ABNORMAL HIGH (ref 0.44–1.00)
GFR calc Af Amer: 21 mL/min — ABNORMAL LOW (ref >60)
GFR calc non Af Amer: 18 mL/min — ABNORMAL LOW (ref >60)
Glucose, Bld: 409 mg/dL — ABNORMAL HIGH (ref 65–99)
Potassium: 5.2 mmol/L — ABNORMAL HIGH (ref 3.5–5.1)
Sodium: 127 mmol/L — ABNORMAL LOW (ref 135–145)

## 2015-03-20 MED ORDER — ALBUTEROL SULFATE (2.5 MG/3ML) 0.083% IN NEBU
2.5000 mg | INHALATION_SOLUTION | Freq: Three times a day (TID) | RESPIRATORY_TRACT | Status: DC
  Administered 2015-03-20 – 2015-03-24 (×10): 2.5 mg via RESPIRATORY_TRACT
  Filled 2015-03-20 (×12): qty 3

## 2015-03-20 MED ORDER — METOLAZONE 5 MG PO TABS
5.0000 mg | ORAL_TABLET | Freq: Once | ORAL | Status: AC
  Administered 2015-03-20: 5 mg via ORAL
  Filled 2015-03-20: qty 1

## 2015-03-20 MED ORDER — INSULIN ASPART 100 UNIT/ML ~~LOC~~ SOLN
0.0000 [IU] | Freq: Three times a day (TID) | SUBCUTANEOUS | Status: DC
  Administered 2015-03-20: 8 [IU] via SUBCUTANEOUS
  Administered 2015-03-20: 11 [IU] via SUBCUTANEOUS
  Administered 2015-03-20 – 2015-03-21 (×2): 15 [IU] via SUBCUTANEOUS
  Administered 2015-03-22: 3 [IU] via SUBCUTANEOUS
  Administered 2015-03-22: 16 [IU] via SUBCUTANEOUS
  Administered 2015-03-22: 5 [IU] via SUBCUTANEOUS
  Administered 2015-03-23: 8 [IU] via SUBCUTANEOUS
  Administered 2015-03-23 – 2015-03-24 (×2): 5 [IU] via SUBCUTANEOUS
  Administered 2015-03-24: 2 [IU] via SUBCUTANEOUS
  Administered 2015-03-24: 11 [IU] via SUBCUTANEOUS
  Administered 2015-03-25: 5 [IU] via SUBCUTANEOUS
  Administered 2015-03-25: 3 [IU] via SUBCUTANEOUS

## 2015-03-20 MED ORDER — POLYETHYLENE GLYCOL 3350 17 G PO PACK
17.0000 g | PACK | Freq: Every day | ORAL | Status: DC
  Administered 2015-03-23 – 2015-03-25 (×3): 17 g via ORAL
  Filled 2015-03-20 (×6): qty 1

## 2015-03-20 MED ORDER — PREDNISONE 20 MG PO TABS
40.0000 mg | ORAL_TABLET | Freq: Every day | ORAL | Status: DC
  Filled 2015-03-20: qty 2

## 2015-03-20 MED ORDER — FLEET ENEMA 7-19 GM/118ML RE ENEM
1.0000 | ENEMA | Freq: Once | RECTAL | Status: AC
  Administered 2015-03-20: 1 via RECTAL
  Filled 2015-03-20: qty 1

## 2015-03-20 NOTE — Progress Notes (Addendum)
[place] for Eliquis Indication: atrial fibrillation  Allergies  Allergen Reactions  . Cefuroxime Axetil Shortness Of Breath, Swelling and Rash    Throat swelling  . Cephalexin Shortness Of Breath, Swelling and Rash    Throat swelling  . Clarithromycin Shortness Of Breath, Swelling and Rash    Throat swelling  . Doxycycline Shortness Of Breath, Swelling and Rash     rash on legs and feet, throat swelling  . Erythromycin Shortness Of Breath, Swelling and Rash    Throat swelling  . Tetracycline Shortness Of Breath, Swelling and Rash    Throat swelling    Patient Measurements: Height: 5\' 1"  (154.9 cm) Weight: 188 lb 12.8 oz (85.639 kg) IBW/kg (Calculated) : 47.8  Vital Signs: Temp: 97.5 F (36.4 C) (08/07 0517) Temp Source: Oral (08/07 0517) BP: 145/79 mmHg (08/07 0517) Pulse Rate: 73 (08/07 0517)  Labs:  Recent Labs  03/17/15 1640 03/17/15 1729  03/18/15 0443 03/19/15 0345 03/20/15 0633  HGB  --   --   < > 11.9* 11.4* 12.7  HCT  --   --   --  38.7 35.9* 37.8  PLT  --   --   --  149* 145* 166  CREATININE  --   --   --  2.00* 2.74* 2.43*  TROPONINI <0.03 <0.03  --   --   --   --   < > = values in this interval not displayed.  Estimated Creatinine Clearance: 18.9 mL/min (by C-G formula based on Cr of 2.43).   Medical History: Past Medical History  Diagnosis Date  . CAD (coronary artery disease)   . CHF (congestive heart failure)   . Paroxysmal atrial fibrillation   . Bradycardia   . Hypertension   . Renal artery stenosis   . COPD (chronic obstructive pulmonary disease)   . Obesity hypoventilation syndrome   . Pruritus   . Tracheobronchitis   . Acute rhinosinusitis   . Anemia   . Hypoxemia   . Peripheral edema   . Obesity, endogenous   . Diabetes mellitus   . Acid reflux disease   . Osteoarthritis, generalized   . Hypothyroidism   . Lung nodule     lingula  . OSA (obstructive sleep apnea)     NPSG  04/03/00--AHI 28.hr  . DVT (deep venous thrombosis) 2003    left leg  . Vaginal atrophy   . Hx of colonic polyps   . Acute diastolic heart failure   . Secondary pulmonary hypertension 06/09/2013  . Blood transfusion without reported diagnosis 2009    had internal bleeding  . Cancer 1959    ovarian cancer  . Osteoporosis     Medications:  Prescriptions prior to admission  Medication Sig Dispense Refill Last Dose  . allopurinol (ZYLOPRIM) 100 MG tablet Take 1 tablet (100 mg total) by mouth daily. (Patient taking differently: Take 100 mg by mouth 2 (two) times daily. )   03/16/2015 at Unknown time  . apixaban (ELIQUIS) 5 MG TABS tablet Take 1 tablet (5 mg total) by mouth 2 (two) times daily. 180 tablet 0 03/16/2015 at am  . budesonide (PULMICORT) 0.25 MG/2ML nebulizer solution Take 2 mLs (0.25 mg total) by nebulization daily. 60 mL prn Past Month at Unknown time  . colchicine 0.6 MG tablet Take 1.2 mg by mouth daily as needed (for gout flareups).    2 months  . diltiazem (CARDIZEM CD) 360 MG 24 hr capsule Take 1 capsule (  360 mg total) by mouth 2 (two) times daily. 90 capsule 0 03/16/2015 at Unknown time  . furosemide (LASIX) 40 MG tablet Take 1 tablet (40 mg total) by mouth 2 (two) times daily. Contact cardiology office or PCP if you experienced > 3 pounds overnight or > 5 pounds in 1 week for further instructions 90 tablet 0 03/16/2015 at Unknown time  . insulin glargine (LANTUS) 100 UNIT/ML injection Inject 0.2 mLs (20 Units total) into the skin daily. (Patient taking differently: Inject 20-35 Units into the skin 2 (two) times daily. Takes 20 units in am and 35 units in pm) 10 mL 12 03/16/2015 at Unknown time  . insulin lispro (HUMALOG) 100 UNIT/ML injection Inject 2 Units into the skin 2 (two) times daily with breakfast and lunch. Per sliding scale. Patient unsure about how much she really takes   03/16/2015 at Unknown time  . isosorbide mononitrate (IMDUR) 30 MG 24 hr tablet 1 tablet by mouth daily  (Patient taking differently: Take 30 mg by mouth daily. 1 tablet by mouth daily) 90 tablet 0 03/16/2015 at Unknown time  . levothyroxine (SYNTHROID, LEVOTHROID) 25 MCG tablet Take 25 mcg by mouth See admin instructions. Take 1 tablet (25 mcg) on Monday, Wednesday, Friday   03/16/2015 at Unknown time  . levothyroxine (SYNTHROID, LEVOTHROID) 50 MCG tablet Take 50 mcg by mouth See admin instructions. Take 50mg  on Tues, Thurs, Sat and sundays   03/15/2015 at Unknown time  . methocarbamol (ROBAXIN) 500 MG tablet Take 500 mg by mouth at bedtime.   03/15/2015 at Unknown time  . metoprolol (LOPRESSOR) 50 MG tablet 1 tablet by mouth twice a day (Patient taking differently: Take 50 mg by mouth 2 (two) times daily. 1 tablet by mouth twice a day) 180 tablet 0 03/16/2015 at 0900  . nitroGLYCERIN (NITROSTAT) 0.4 MG SL tablet Place 0.4 mg under the tongue every 5 (five) minutes as needed for chest pain.    not used  . oxybutynin (DITROPAN XL) 10 MG 24 hr tablet Take 10 mg by mouth daily.    03/16/2015 at Unknown time  . traMADol (ULTRAM) 50 MG tablet Take 50 mg by mouth every 6 (six) hours as needed for moderate pain.    Past Week at Unknown time  . albuterol (PROAIR HFA) 108 (90 BASE) MCG/ACT inhaler 1-2 puffs QID prn (Patient taking differently: Inhale 1 puff into the lungs every 4 (four) hours as needed for shortness of breath. ) 1 Inhaler 6 not used  . amoxicillin-clavulanate (AUGMENTIN) 875-125 MG per tablet Take 1 tablet by mouth 2 (two) times daily. (Patient not taking: Reported on 02/23/2015) 14 tablet 0 Not Taking  . azelastine (ASTELIN) 137 MCG/SPRAY nasal spray 1-2 sprays each nostril once or twice daily 90 mL 3 Taking   Scheduled:  . albuterol  2.5 mg Nebulization TID  . allopurinol  100 mg Oral BID  . budesonide  0.25 mg Nebulization Daily  . furosemide  40 mg Intravenous BID  . guaiFENesin  600 mg Oral BID  . insulin aspart  0-15 Units Subcutaneous TID WC  . insulin aspart  0-5 Units Subcutaneous QHS  .  insulin glargine  20 Units Subcutaneous BID  . isosorbide mononitrate  30 mg Oral Daily  . levothyroxine  25 mcg Oral Once per day on Mon Wed Fri  . levothyroxine  50 mcg Oral Once per day on Sun Tue Thu Sat  . methocarbamol  500 mg Oral QHS  . metoprolol  50 mg Oral  BID  . oxybutynin  10 mg Oral Daily  . [START ON 03/21/2015] predniSONE  40 mg Oral Q breakfast  . senna-docusate  1 tablet Oral BID  . sodium chloride  3 mL Intravenous Q12H    Assessment: 81yo female hit head on table with a fall after tripping, golf-ball-sized hematoma to scalp but head CT clear, to continue Eliquis for Afib.  Initially pt stated she took 1/2 tablets (equal to 2.5 mg bid), after discussion with family and patient, she reports she was actually taking 1 tablet bid. CBC stable.  Plan:  Hold Eliquis per MD F/u plan for resuming anticoagulation after cath tomorrow     Hughes Better, PharmD, BCPS Clinical Pharmacist Pager: [phone] 03/20/2015 2:21 PM

## 2015-03-20 NOTE — Progress Notes (Signed)
Patient Name: [name] Date of Encounter: 03/20/2015  Principal Problem:   Acute on chronic diastolic heart failure Active Problems:   Hypothyroidism   Obstructive sleep apnea   Atrial fibrillation   COPD (chronic obstructive pulmonary disease)   Secondary pulmonary hypertension   Overactive bladder   Type 2 diabetes mellitus with other circulatory complications   Length of Stay: 4  SUBJECTIVE  Feeling a little better, able to lie a little flatter in bed, still orthopneic. Better UO yesterday. Roughly 1.2L net diuresis after metolazone. Depite diuresis, renal function is trending better and K is high. atrial fibrillation, no further bradycardia.  CURRENT MEDS . albuterol  2.5 mg Nebulization QID  . allopurinol  100 mg Oral BID  . budesonide  0.25 mg Nebulization Daily  . furosemide  40 mg Intravenous BID  . guaiFENesin  600 mg Oral BID  . insulin aspart  0-15 Units Subcutaneous TID WC  . insulin aspart  0-5 Units Subcutaneous QHS  . insulin glargine  20 Units Subcutaneous BID  . isosorbide mononitrate  30 mg Oral Daily  . levothyroxine  25 mcg Oral Once per day on Mon Wed Fri  . levothyroxine  50 mcg Oral Once per day on Sun Tue Thu Sat  . methocarbamol  500 mg Oral QHS  . methylPREDNISolone (SOLU-MEDROL) injection  40 mg Intravenous Daily  . metoprolol  50 mg Oral BID  . oxybutynin  10 mg Oral Daily  . senna-docusate  1 tablet Oral BID  . sodium chloride  3 mL Intravenous Q12H    OBJECTIVE   Intake/Output Summary (Last 24 hours) at 03/20/15 0931 Last data filed at 03/20/15 0600  Gross per 24 hour  Intake    700 ml  Output   1875 ml  Net  -1175 ml   Filed Weights   03/18/15 0635 03/19/15 0256 03/20/15 0517  Weight: 185 lb (83.915 kg) 189 lb 1.6 oz (85.775 kg) 188 lb 12.8 oz (85.639 kg)    PHYSICAL EXAM Filed Vitals:   03/19/15 2001 03/19/15 2147 03/20/15 0517 03/20/15 0858  BP:  135/84 145/79   Pulse: 76 64 73   Temp:  97.8 F (36.6 C) 97.5  F (36.4 C)   TempSrc:  Oral Oral   Resp: 18 18 18    Height:      Weight:   188 lb 12.8 oz (85.639 kg)   SpO2: 96% 93% 96% 96%  General: Alert, oriented x3, no distress Head: no evidence of trauma, PERRL, EOMI, no exophtalmos or lid lag, no myxedema, no xanthelasma; normal ears, nose and oropharynx Neck: 12-14 cm elevation in jugular venous pulsations and no additional hepatojugular reflux; brisk carotid pulses without delay and no carotid bruits Chest: clear to auscultation, no signs of consolidation by percussion or palpation, normal fremitus, symmetrical and full respiratory excursions Cardiovascular: normal position and quality of the apical impulse, irregular rhythm, normal first and loud second heart sounds, no rubs or gallops, 2/6 holosystolic murmur at LLSB Abdomen: no tenderness or distention, no masses by palpation, no abnormal pulsatility or arterial bruits, normal bowel sounds, no hepatosplenomegaly Extremities: no clubbing, cyanosis or edema; 2+ radial, ulnar and brachial pulses bilaterally; 2+ right femoral, posterior tibial and dorsalis pedis pulses; 2+ left femoral, posterior tibial and dorsalis pedis pulses; no subclavian or femoral bruits Neurological: grossly nonfocal  LABS  CBC  Recent Labs  03/18/15 0443 03/19/15 0345  WBC 6.5 6.1  HGB 11.9* 11.4*  HCT 38.7 35.9*  MCV 96.8 95.0  PLT 149* 427*   Basic Metabolic Panel  Recent Labs  03/19/15 0345 03/20/15 0633  NA 128* 127*  K 4.6 5.2*  CL 88* 88*  CO2 26 24  GLUCOSE 454* 409*  BUN 49* 77*  CREATININE 2.74* 2.43*  CALCIUM 8.1* 8.6*   Liver Function Tests No results for input(s): AST, ALT, ALKPHOS, BILITOT, PROT, ALBUMIN in the last 72 hours. No results for input(s): LIPASE, AMYLASE in the last 72 hours. Cardiac Enzymes  Recent Labs  03/17/15 1640 03/17/15 1729  TROPONINI <0.03 <0.03    Radiology Studies Imaging results have been reviewed and Dg Chest 2 View  03/18/2015   CLINICAL DATA:   Cough and nausea for 2 months, dyspnea, coronary artery disease, CHF, atrial fibrillation, COPD, diabetes mellitus, coronary artery disease, former smoker  EXAM: CHEST  2 VIEW  COMPARISON:  03/16/2015  FINDINGS: Enlargement of cardiac silhouette with vascular congestion.  Atherosclerotic calcification aorta.  Peribronchial thickening with chronic accentuation of interstitial markings little changed.  Mitral annular calcification noted.  Bibasilar atelectasis.  No pulmonary infiltrate, pleural effusion or pneumothorax.  Osseous demineralization with compression deformity of a vertebral body at the thoracolumbar junction question T12 unchanged.  RIGHT shoulder prosthesis with LEFT glenohumeral degenerative changes noted.  Surgical clips RIGHT upper quadrant likely prior cholecystectomy.  IMPRESSION: Chronic bronchitic and interstitial changes with bibasilar atelectasis.  Enlargement of cardiac silhouette with pulmonary vascular congestion.   Electronically Signed   By: Lavonia Dana M.D.   On: 03/18/2015 10:14    TELE AF with controlled rate  ASSESSMENT AND PLAN  Severe pulmonary HTN and secondary right heart failure with multiple possible causes. COPD, OSA, possible diastolic left heart failure (unable to evaluate on echo due to arrhythmia, suggested by elevated BNP). CTEP unlikely (Already on anticoagulant for atrial fibrillation). Plan right  heart cath on Monday. Will hold Eliquis today. Limited cardiac output due to pulmonary HTN and RV failure are likely the cause for failure to respond to diuretics and worsening renal function. Known CAD, but little to suggest this is playing a role in current illness. Acute on chronic renal insufficiency and worsening hyponatremia complicates the situation further. Fluid restriction. Will try another dose of metolazone. Caution with renal function and K.  Sanda Klein, MD, [facility] [phone] office [phone] pager 03/20/2015 9:31 AM

## 2015-03-20 NOTE — Progress Notes (Signed)
Utilization review completed.  

## 2015-03-20 NOTE — Progress Notes (Signed)
[facility] PROGRESS NOTE  [name] MVE:[id number] DOB: 18-Nov-1933 DOA: 03/16/2015 PCP: Cari Caraway, MD  Assessment/Plan: 1. Acute on chronic diastolic heart failure: The patient is presenting with progressive worsening shortness of breath as well as orthopnea and PND. Chest shows mild vascular congestion. elevation of serum BNP. Monitor ins and outs. Continue with lasix 40 mg IV BID.  Weight: -79---82--85---85 Cardiology consulted and following.  Plan for right side heart cath on Monday.  To received another dose of metolazone today.   2.Atrial fibrillation. Holding Apixaban on 8-7 anticipating cath 8-8 HR drop to 37 8-5. She received 360- Cardizem  BID 8-04. I reviewed meds with patient she take 180 mg BID of Cardizem. If she get 360 tablet she only takes once a day.  Holder parameter for metoprolol.  If HR increased will resume Cardizem 180 mg BID.   3.Chronic hypoxia with COPD and OSA. Continue close monitoring. Schedule nebulizer.  DC solumedrol. Start prednisone.   4.Hypothyroidism. Continue Synthroid.  5.Overactive bladder. Continue home medication.  6. Diabetes mellitus. Continue sliding scale insulin and  Lantus Taper steroids.   7-AKI: CKD stage III; monitor on IV lasix.  Cr decreased to 2.4 from 2.7  Hyperkalemia; on lasix, torsemide. laxative  Constipation: laxative. Fleet enema.   Code Status: Full Code.  Family Communication: care discussed with patient.  Disposition Plan: Remain inpatient, PT evaluation.    Consultants:  none  Procedures:  ECHO; Left ventricle: The cavity size was normal. There was mild concentric hypertrophy. Systolic function was normal. The estimated ejection fraction was in the range of 60% to 65%. Wall motion was normal; there were no regional wall motion abnormalities. - Aortic valve: There was trivial regurgitation. - Mitral valve: Severely calcified annulus. - Left atrium: The atrium was  moderately dilated. - Right ventricle: The cavity size was severely dilated. Systolic function was moderately reduced. - Right atrium: The atrium was severely dilated. - Tricuspid valve: There was moderate regurgitation. - Pulmonary arteries: Systolic pressure was severely increased. PA peak pressure: 68 mm Hg (S).  Antibiotics:  none  HPI/Subjective: She is still coughing, breathing better. No BM, think she is impacted.   Objective: Filed Vitals:   03/20/15 1500  BP: 133/70  Pulse: 72  Temp: 97.6 F (36.4 C)  Resp: 22    Intake/Output Summary (Last 24 hours) at 03/20/15 1516 Last data filed at 03/20/15 1500  Gross per 24 hour  Intake   1420 ml  Output   3176 ml  Net  -1756 ml   Filed Weights   03/18/15 0635 03/19/15 0256 03/20/15 0517  Weight: 83.915 kg (185 lb) 85.775 kg (189 lb 1.6 oz) 85.639 kg (188 lb 12.8 oz)    Exam:   General:  Alert in no distress.   Cardiovascular: S 1, S 2 RRR  Respiratory: Bilateral crackles.   Abdomen: bs presents, soft, nt  Musculoskeletal: bilateral LE edema/   Data Reviewed: Basic Metabolic Panel:  Recent Labs Lab 03/16/15 1924 03/17/15 0459 03/18/15 0443 03/19/15 0345 03/20/15 0633  NA 139 139 134* 128* 127*  K 3.8 3.6 4.1 4.6 5.2*  CL 100* 99* 95* 88* 88*  CO2 29 32 32 26 24  GLUCOSE 307* 257* 260* 454* 409*  BUN 28* 24* 32* 49* 77*  CREATININE 1.86* 1.74* 2.00* 2.74* 2.43*  CALCIUM 9.3 8.8* 8.5* 8.1* 8.6*   Liver Function Tests: No results for input(s): AST, ALT, ALKPHOS, BILITOT, PROT, ALBUMIN in the last 168 hours. No results for input(s): LIPASE,  AMYLASE in the last 168 hours. No results for input(s): AMMONIA in the last 168 hours. CBC:  Recent Labs Lab 03/16/15 1924 03/17/15 0645 03/18/15 0443 03/19/15 0345 03/20/15 0633  WBC 6.1 7.2 6.5 6.1 10.8*  NEUTROABS  --  5.3  --   --   --   HGB 12.9 11.6* 11.9* 11.4* 12.7  HCT 41.0 37.3 38.7 35.9* 37.8  MCV 96.0 97.4 96.8 95.0 92.9  PLT 166 142*  149* 145* 166   Cardiac Enzymes:  Recent Labs Lab 03/16/15 1944 03/17/15 0645 03/17/15 1640 03/17/15 1729  TROPONINI <0.03 <0.03 <0.03 <0.03   BNP (last 3 results)  Recent Labs  12/05/14 2250 03/16/15 1924  BNP 1143.9* 1109.9*    ProBNP (last 3 results) No results for input(s): PROBNP in the last 8760 hours.  CBG:  Recent Labs Lab 03/19/15 1613 03/19/15 2143 03/20/15 0605 03/20/15 0739 03/20/15 1118  GLUCAP 293* 460* 371* 385* 306*    No results found for this or any previous visit (from the past 240 hour(s)).   Studies: No results found.  Scheduled Meds: . albuterol  2.5 mg Nebulization TID  . allopurinol  100 mg Oral BID  . budesonide  0.25 mg Nebulization Daily  . furosemide  40 mg Intravenous BID  . guaiFENesin  600 mg Oral BID  . insulin aspart  0-15 Units Subcutaneous TID WC  . insulin aspart  0-5 Units Subcutaneous QHS  . insulin glargine  20 Units Subcutaneous BID  . isosorbide mononitrate  30 mg Oral Daily  . levothyroxine  25 mcg Oral Once per day on Mon Wed Fri  . levothyroxine  50 mcg Oral Once per day on Sun Tue Thu Sat  . methocarbamol  500 mg Oral QHS  . metoprolol  50 mg Oral BID  . oxybutynin  10 mg Oral Daily  . [START ON 03/21/2015] predniSONE  40 mg Oral Q breakfast  . senna-docusate  1 tablet Oral BID  . sodium chloride  3 mL Intravenous Q12H   Continuous Infusions:   Principal Problem:   Acute on chronic diastolic heart failure Active Problems:   Hypothyroidism   Obstructive sleep apnea   Atrial fibrillation   COPD (chronic obstructive pulmonary disease)   Secondary pulmonary hypertension   Overactive bladder   Type 2 diabetes mellitus with other circulatory complications    Time spent: 35 minutes.     Niel Hummer A  [facility] Pager [phone]. If 7PM-7AM, please contact night-coverage at [email or web address], password [facility] 03/20/2015, 3:16 PM  LOS: 4 days

## 2015-03-21 ENCOUNTER — Encounter (HOSPITAL_COMMUNITY): Payer: Self-pay | Admitting: Internal Medicine

## 2015-03-21 ENCOUNTER — Encounter (HOSPITAL_COMMUNITY)
Admission: EM | Disposition: A | Discharge status: Home Health | Payer: Medicare Other | Source: Home / Self Care | Attending: Internal Medicine

## 2015-03-21 HISTORY — PX: CARDIAC CATHETERIZATION: SHX172

## 2015-03-21 LAB — CBC
HCT: 39.1 % (ref 36.0–46.0)
Hemoglobin: 12.5 g/dL (ref 12.0–15.0)
MCH: 30.6 pg (ref 26.0–34.0)
MCHC: 32 g/dL (ref 30.0–36.0)
MCV: 95.8 fL (ref 78.0–100.0)
PLATELETS: 170 10*3/uL (ref 150–400)
RBC: 4.08 MIL/uL (ref 3.87–5.11)
RDW: 15.2 % (ref 11.5–15.5)
WBC: 8.7 10*3/uL (ref 4.0–10.5)

## 2015-03-21 LAB — BASIC METABOLIC PANEL
Anion gap: 9 (ref 5–15)
Anion gap: 12 (ref 5–15)
BUN: 72 mg/dL — ABNORMAL HIGH (ref 6–20)
BUN: 74 mg/dL — ABNORMAL HIGH (ref 6–20)
CO2: 34 mmol/L — AB (ref 22–32)
CO2: 36 mmol/L — ABNORMAL HIGH (ref 22–32)
Calcium: 8.8 mg/dL — ABNORMAL LOW (ref 8.9–10.3)
Calcium: 8.8 mg/dL — ABNORMAL LOW (ref 8.9–10.3)
Chloride: 88 mmol/L — ABNORMAL LOW (ref 101–111)
Chloride: 83 mmol/L — ABNORMAL LOW (ref 101–111)
Creatinine, Ser: 2.12 mg/dL — ABNORMAL HIGH (ref 0.44–1.00)
Creatinine, Ser: 2.02 mg/dL — ABNORMAL HIGH (ref 0.44–1.00)
GFR calc Af Amer: 25 mL/min — ABNORMAL LOW (ref >60)
GFR calc Af Amer: 26 mL/min — ABNORMAL LOW (ref >60)
GFR calc non Af Amer: 21 mL/min — ABNORMAL LOW (ref >60)
GFR calc non Af Amer: 22 mL/min — ABNORMAL LOW (ref >60)
Glucose, Bld: 275 mg/dL — ABNORMAL HIGH (ref 65–99)
Glucose, Bld: 539 mg/dL — ABNORMAL HIGH (ref 65–99)
POTASSIUM: 4.1 mmol/L (ref 3.5–5.1)
Potassium: 4 mmol/L (ref 3.5–5.1)
SODIUM: 131 mmol/L — AB (ref 135–145)
Sodium: 131 mmol/L — ABNORMAL LOW (ref 135–145)

## 2015-03-21 LAB — GLUCOSE, CAPILLARY
GLUCOSE-CAPILLARY: 231 mg/dL — AB (ref 65–99)
Glucose-Capillary: 269 mg/dL — ABNORMAL HIGH (ref 65–99)
Glucose-Capillary: 364 mg/dL — ABNORMAL HIGH (ref 65–99)
Glucose-Capillary: 518 mg/dL — ABNORMAL HIGH (ref 65–99)

## 2015-03-21 LAB — POCT I-STAT 3, VENOUS BLOOD GAS (G3P V)
Acid-Base Excess: 10 mmol/L — ABNORMAL HIGH (ref 0.0–2.0)
Acid-Base Excess: 11 mmol/L — ABNORMAL HIGH (ref 0.0–2.0)
BICARBONATE: 38.6 meq/L — AB (ref 20.0–24.0)
BICARBONATE: 40 meq/L — AB (ref 20.0–24.0)
O2 SAT: 55 %
O2 Saturation: 56 %
PCO2 VEN: 71.8 mmHg — AB (ref 45.0–50.0)
PH VEN: 7.346 — AB (ref 7.250–7.300)
PO2 VEN: 32 mmHg (ref 30.0–45.0)
TCO2: 41 mmol/L (ref 0–100)
TCO2: 42 mmol/L (ref 0–100)
pCO2, Ven: 73 mmHg (ref 45.0–50.0)
pH, Ven: 7.339 — ABNORMAL HIGH (ref 7.250–7.300)
pO2, Ven: 33 mmHg (ref 30.0–45.0)

## 2015-03-21 SURGERY — RIGHT HEART CATH

## 2015-03-21 MED ORDER — APIXABAN 5 MG PO TABS
5.0000 mg | ORAL_TABLET | Freq: Two times a day (BID) | ORAL | Status: DC
Start: 2015-03-21 — End: 2015-03-25
  Administered 2015-03-21 – 2015-03-25 (×9): 5 mg via ORAL
  Filled 2015-03-21 (×10): qty 1

## 2015-03-21 MED ORDER — SODIUM CHLORIDE 0.9 % IV SOLN: 250.0000 mL | INTRAVENOUS | Status: DC | PRN

## 2015-03-21 MED ORDER — LIDOCAINE HCL (PF) 1 % IJ SOLN
INTRAMUSCULAR | Status: DC | PRN
  Administered 2015-03-21: 14:00:00

## 2015-03-21 MED ORDER — SODIUM CHLORIDE 0.9 % IJ SOLN
3.0000 mL | Freq: Two times a day (BID) | INTRAMUSCULAR | Status: DC
  Administered 2015-03-22 – 2015-03-25 (×3): 3 mL via INTRAVENOUS

## 2015-03-21 MED ORDER — ACETAMINOPHEN 325 MG PO TABS: 650.0000 mg | ORAL_TABLET | ORAL | Status: DC | PRN

## 2015-03-21 MED ORDER — SODIUM CHLORIDE 0.9 % IJ SOLN: 3.0000 mL | INTRAMUSCULAR | Status: DC | PRN

## 2015-03-21 MED ORDER — SODIUM CHLORIDE 0.9 % IJ SOLN: 3.0000 mL | Freq: Two times a day (BID) | INTRAMUSCULAR | Status: DC

## 2015-03-21 MED ORDER — SODIUM CHLORIDE 0.9 % IV SOLN
INTRAVENOUS | Status: DC | PRN
  Administered 2015-03-21: 15 mL/h via INTRAVENOUS

## 2015-03-21 MED ORDER — SODIUM CHLORIDE 0.9 % IJ SOLN
3.0000 mL | Freq: Two times a day (BID) | INTRAMUSCULAR | Status: DC
  Administered 2015-03-23 – 2015-03-25 (×2): 3 mL via INTRAVENOUS

## 2015-03-21 MED ORDER — SODIUM CHLORIDE 0.9 % IJ SOLN
3.0000 mL | INTRAMUSCULAR | Status: DC | PRN
Start: 2015-03-21 — End: 2015-03-21

## 2015-03-21 MED ORDER — SODIUM CHLORIDE 0.9 % IV SOLN: INTRAVENOUS | Status: DC

## 2015-03-21 MED ORDER — ONDANSETRON HCL 4 MG/2ML IJ SOLN: 4.0000 mg | Freq: Four times a day (QID) | INTRAMUSCULAR | Status: DC | PRN

## 2015-03-21 MED ORDER — PREDNISONE 20 MG PO TABS
40.0000 mg | ORAL_TABLET | Freq: Every day | ORAL | Status: DC
  Administered 2015-03-21 – 2015-03-24 (×4): 40 mg via ORAL
  Filled 2015-03-21 (×5): qty 2

## 2015-03-21 MED ORDER — ASPIRIN 81 MG PO CHEW
81.0000 mg | CHEWABLE_TABLET | ORAL | Status: DC
  Filled 2015-03-21: qty 1

## 2015-03-21 MED ORDER — SODIUM CHLORIDE 0.9 % IJ SOLN
3.0000 mL | Freq: Two times a day (BID) | INTRAMUSCULAR | Status: DC
  Administered 2015-03-21 (×2): 3 mL via INTRAVENOUS

## 2015-03-21 MED ORDER — LIDOCAINE HCL (PF) 1 % IJ SOLN
INTRAMUSCULAR | Status: AC
  Filled 2015-03-21: qty 30

## 2015-03-21 MED ORDER — INSULIN ASPART 100 UNIT/ML ~~LOC~~ SOLN
10.0000 [IU] | Freq: Once | SUBCUTANEOUS | Status: AC
  Administered 2015-03-22: 10 [IU] via SUBCUTANEOUS

## 2015-03-21 SURGICAL SUPPLY — 7 items
CATH BALLN WEDGE 5F 110CM (CATHETERS) ×3 IMPLANT
KIT HEART LEFT (KITS) ×3 IMPLANT
PACK CARDIAC CATHETERIZATION (CUSTOM PROCEDURE TRAY) ×3 IMPLANT
SHEATH FAST CATH BRACH 5F 5CM (SHEATH) ×3 IMPLANT
TRANSDUCER W/STOPCOCK (MISCELLANEOUS) ×4 IMPLANT
TUBING ART PRESS 72  MALE/FEM (TUBING) ×2
TUBING ART PRESS 72 MALE/FEM (TUBING) IMPLANT

## 2015-03-21 NOTE — Progress Notes (Signed)
Pt CBG 518, Schorr made aware and ordered stat BMP. Given Lantus 20units per order.

## 2015-03-21 NOTE — Progress Notes (Signed)
[facility] PROGRESS NOTE  [name] [id number]:[id number] DOB: Sep 29, 1933 DOA: 03/16/2015 PCP: Cari Caraway, MD  Assessment/Plan: 1. Acute on chronic diastolic heart failure: The patient is presenting with progressive worsening shortness of breath as well as orthopnea and PND. Chest shows mild vascular congestion. elevation of serum BNP. Monitor ins and outs. Continue with lasix 40 mg IV BID.  Urine out pu 5 L on 8-7 Weight: -79---82--85---85---83--- Cardiology consulted and following.  Plan for right side heart cath today  Received a dose of metolazone 8-7  2.Atrial fibrillation. Holding Apixaban on 8-7 anticipating cath 8-8 HR drop to 37 8-5. She received 360- Cardizem  BID 8-04. I reviewed meds with patient she take 180 mg BID of Cardizem. If she get 360 tablet she only takes once a day.  Holder parameter for metoprolol.  If HR increased will resume Cardizem 180 mg BID.   3.Chronic hypoxia with COPD and OSA. Continue close monitoring. Schedule nebulizer.  Will need  Prednisone taper   4.Hypothyroidism. Continue Synthroid.  5.Overactive bladder. Continue home medication.  6. Diabetes mellitus. Continue sliding scale insulin and  Lantus Taper steroids.   7-AKI: CKD stage III; monitor on IV lasix.  Cr decreased to---2.2--- 2.4 from 2.7  Hyperkalemia; on lasix, torsemide. Laxative. Resolved.   Constipation: laxative. Fleet enema.   Code Status: Full Code.  Family Communication: care discussed with patient.  Disposition Plan: Remain inpatient, PT evaluation.    Consultants:  none  Procedures:  ECHO; Left ventricle: The cavity size was normal. There was mild concentric hypertrophy. Systolic function was normal. The estimated ejection fraction was in the range of 60% to 65%. Wall motion was normal; there were no regional wall motion abnormalities. - Aortic valve: There was trivial regurgitation. - Mitral valve: Severely calcified  annulus. - Left atrium: The atrium was moderately dilated. - Right ventricle: The cavity size was severely dilated. Systolic function was moderately reduced. - Right atrium: The atrium was severely dilated. - Tricuspid valve: There was moderate regurgitation. - Pulmonary arteries: Systolic pressure was severely increased. PA peak pressure: 68 mm Hg (S).  Antibiotics:  none  HPI/Subjective: She is feeling better. No chest pain. Had 2 BM yesterday and one today.   Objective: Filed Vitals:   03/21/15 0434  BP: 132/79  Pulse: 72  Temp: 98 F (36.7 C)  Resp: 18    Intake/Output Summary (Last 24 hours) at 03/21/15 1020 Last data filed at 03/21/15 0600  Gross per 24 hour  Intake   1320 ml  Output   4752 ml  Net  -3432 ml   Filed Weights   03/19/15 0256 03/20/15 0517 03/21/15 0434  Weight: 85.775 kg (189 lb 1.6 oz) 85.639 kg (188 lb 12.8 oz) 83.19 kg (183 lb 6.4 oz)    Exam:   General:  Alert in no distress.   Cardiovascular: S 1, S 2 RRR  Respiratory: Bilateral crackles.   Abdomen: bs presents, soft, nt  Musculoskeletal: bilateral LE edema/   Data Reviewed: Basic Metabolic Panel:  Recent Labs Lab 03/17/15 0459 03/18/15 0443 03/19/15 0345 03/20/15 0633 03/21/15 0449  NA 139 134* 128* 127* 131*  K 3.6 4.1 4.6 5.2* 4.1  CL 99* 95* 88* 88* 88*  CO2 32 32 26 24 34*  GLUCOSE 257* 260* 454* 409* 275*  BUN 24* 32* 49* 77* 72*  CREATININE 1.74* 2.00* 2.74* 2.43* 2.12*  CALCIUM 8.8* 8.5* 8.1* 8.6* 8.8*   Liver Function Tests: No results for input(s): AST, ALT, ALKPHOS, BILITOT, PROT,  ALBUMIN in the last 168 hours. No results for input(s): LIPASE, AMYLASE in the last 168 hours. No results for input(s): AMMONIA in the last 168 hours. CBC:  Recent Labs Lab 03/17/15 0645 03/18/15 0443 03/19/15 0345 03/20/15 0633 03/21/15 0449  WBC 7.2 6.5 6.1 10.8* 8.7  NEUTROABS 5.3  --   --   --   --   HGB 11.6* 11.9* 11.4* 12.7 12.5  HCT 37.3 38.7 35.9* 37.8  39.1  MCV 97.4 96.8 95.0 92.9 95.8  PLT 142* 149* 145* 166 170   Cardiac Enzymes:  Recent Labs Lab 03/16/15 1944 03/17/15 0645 03/17/15 1640 03/17/15 1729  TROPONINI <0.03 <0.03 <0.03 <0.03   BNP (last 3 results)  Recent Labs  12/05/14 2250 03/16/15 1924  BNP 1143.9* 1109.9*    ProBNP (last 3 results) No results for input(s): PROBNP in the last 8760 hours.  CBG:  Recent Labs Lab 03/20/15 0739 03/20/15 1118 03/20/15 1606 03/20/15 2133 03/21/15 0625  GLUCAP 385* 306* 252* 386* 231*    No results found for this or any previous visit (from the past 240 hour(s)).   Studies: No results found.  Scheduled Meds: . albuterol  2.5 mg Nebulization TID  . allopurinol  100 mg Oral BID  . [START ON 03/22/2015] aspirin  81 mg Oral Pre-Cath  . budesonide  0.25 mg Nebulization Daily  . furosemide  40 mg Intravenous BID  . guaiFENesin  600 mg Oral BID  . insulin aspart  0-15 Units Subcutaneous TID WC  . insulin aspart  0-5 Units Subcutaneous QHS  . insulin glargine  20 Units Subcutaneous BID  . isosorbide mononitrate  30 mg Oral Daily  . levothyroxine  25 mcg Oral Once per day on Mon Wed Fri  . levothyroxine  50 mcg Oral Once per day on Sun Tue Thu Sat  . methocarbamol  500 mg Oral QHS  . metoprolol  50 mg Oral BID  . oxybutynin  10 mg Oral Daily  . polyethylene glycol  17 g Oral Daily  . predniSONE  40 mg Oral Q breakfast  . senna-docusate  1 tablet Oral BID  . sodium chloride  3 mL Intravenous Q12H  . sodium chloride  3 mL Intravenous Q12H   Continuous Infusions: . [START ON 03/22/2015] sodium chloride      Principal Problem:   Acute on chronic diastolic heart failure Active Problems:   Hypothyroidism   Obstructive sleep apnea   Atrial fibrillation   COPD (chronic obstructive pulmonary disease)   Secondary pulmonary hypertension   Overactive bladder   Type 2 diabetes mellitus with other circulatory complications    Time spent: 35 minutes.     Niel Hummer A  [facility] Pager [phone]. If 7PM-7AM, please contact night-coverage at [email or web address], password [facility] 03/21/2015, 10:20 AM  LOS: 5 days

## 2015-03-21 NOTE — H&P (View-Only) (Signed)
Patient Name: [name] Date of Encounter: 03/20/2015  Principal Problem:   Acute on chronic diastolic heart failure Active Problems:   Hypothyroidism   Obstructive sleep apnea   Atrial fibrillation   COPD (chronic obstructive pulmonary disease)   Secondary pulmonary hypertension   Overactive bladder   Type 2 diabetes mellitus with other circulatory complications   Length of Stay: 4  SUBJECTIVE  Feeling a little better, able to lie a little flatter in bed, still orthopneic. Better UO yesterday. Roughly 1.2L net diuresis after metolazone. Depite diuresis, renal function is trending better and K is high. atrial fibrillation, no further bradycardia.  CURRENT MEDS . albuterol  2.5 mg Nebulization QID  . allopurinol  100 mg Oral BID  . budesonide  0.25 mg Nebulization Daily  . furosemide  40 mg Intravenous BID  . guaiFENesin  600 mg Oral BID  . insulin aspart  0-15 Units Subcutaneous TID WC  . insulin aspart  0-5 Units Subcutaneous QHS  . insulin glargine  20 Units Subcutaneous BID  . isosorbide mononitrate  30 mg Oral Daily  . levothyroxine  25 mcg Oral Once per day on Mon Wed Fri  . levothyroxine  50 mcg Oral Once per day on Sun Tue Thu Sat  . methocarbamol  500 mg Oral QHS  . methylPREDNISolone (SOLU-MEDROL) injection  40 mg Intravenous Daily  . metoprolol  50 mg Oral BID  . oxybutynin  10 mg Oral Daily  . senna-docusate  1 tablet Oral BID  . sodium chloride  3 mL Intravenous Q12H    OBJECTIVE   Intake/Output Summary (Last 24 hours) at 03/20/15 0931 Last data filed at 03/20/15 0600  Gross per 24 hour  Intake    700 ml  Output   1875 ml  Net  -1175 ml   Filed Weights   03/18/15 0635 03/19/15 0256 03/20/15 0517  Weight: 185 lb (83.915 kg) 189 lb 1.6 oz (85.775 kg) 188 lb 12.8 oz (85.639 kg)    PHYSICAL EXAM Filed Vitals:   03/19/15 2001 03/19/15 2147 03/20/15 0517 03/20/15 0858  BP:  135/84 145/79   Pulse: 76 64 73   Temp:  97.8 F (36.6 C) 97.5  F (36.4 C)   TempSrc:  Oral Oral   Resp: 18 18 18    Height:      Weight:   188 lb 12.8 oz (85.639 kg)   SpO2: 96% 93% 96% 96%  General: Alert, oriented x3, no distress Head: no evidence of trauma, PERRL, EOMI, no exophtalmos or lid lag, no myxedema, no xanthelasma; normal ears, nose and oropharynx Neck: 12-14 cm elevation in jugular venous pulsations and no additional hepatojugular reflux; brisk carotid pulses without delay and no carotid bruits Chest: clear to auscultation, no signs of consolidation by percussion or palpation, normal fremitus, symmetrical and full respiratory excursions Cardiovascular: normal position and quality of the apical impulse, irregular rhythm, normal first and loud second heart sounds, no rubs or gallops, 2/6 holosystolic murmur at LLSB Abdomen: no tenderness or distention, no masses by palpation, no abnormal pulsatility or arterial bruits, normal bowel sounds, no hepatosplenomegaly Extremities: no clubbing, cyanosis or edema; 2+ radial, ulnar and brachial pulses bilaterally; 2+ right femoral, posterior tibial and dorsalis pedis pulses; 2+ left femoral, posterior tibial and dorsalis pedis pulses; no subclavian or femoral bruits Neurological: grossly nonfocal  LABS  CBC  Recent Labs  03/18/15 0443 03/19/15 0345  WBC 6.5 6.1  HGB 11.9* 11.4*  HCT 38.7 35.9*  MCV 96.8 95.0  PLT 149* 734*   Basic Metabolic Panel  Recent Labs  03/19/15 0345 03/20/15 0633  NA 128* 127*  K 4.6 5.2*  CL 88* 88*  CO2 26 24  GLUCOSE 454* 409*  BUN 49* 77*  CREATININE 2.74* 2.43*  CALCIUM 8.1* 8.6*   Liver Function Tests No results for input(s): AST, ALT, ALKPHOS, BILITOT, PROT, ALBUMIN in the last 72 hours. No results for input(s): LIPASE, AMYLASE in the last 72 hours. Cardiac Enzymes  Recent Labs  03/17/15 1640 03/17/15 1729  TROPONINI <0.03 <0.03    Radiology Studies Imaging results have been reviewed and Dg Chest 2 View  03/18/2015   CLINICAL DATA:   Cough and nausea for 2 months, dyspnea, coronary artery disease, CHF, atrial fibrillation, COPD, diabetes mellitus, coronary artery disease, former smoker  EXAM: CHEST  2 VIEW  COMPARISON:  03/16/2015  FINDINGS: Enlargement of cardiac silhouette with vascular congestion.  Atherosclerotic calcification aorta.  Peribronchial thickening with chronic accentuation of interstitial markings little changed.  Mitral annular calcification noted.  Bibasilar atelectasis.  No pulmonary infiltrate, pleural effusion or pneumothorax.  Osseous demineralization with compression deformity of a vertebral body at the thoracolumbar junction question T12 unchanged.  RIGHT shoulder prosthesis with LEFT glenohumeral degenerative changes noted.  Surgical clips RIGHT upper quadrant likely prior cholecystectomy.  IMPRESSION: Chronic bronchitic and interstitial changes with bibasilar atelectasis.  Enlargement of cardiac silhouette with pulmonary vascular congestion.   Electronically Signed   By: Lavonia Dana M.D.   On: 03/18/2015 10:14    TELE AF with controlled rate  ASSESSMENT AND PLAN  Severe pulmonary HTN and secondary right heart failure with multiple possible causes. COPD, OSA, possible diastolic left heart failure (unable to evaluate on echo due to arrhythmia, suggested by elevated BNP). CTEP unlikely (Already on anticoagulant for atrial fibrillation). Plan right  heart cath on Monday. Will hold Eliquis today. Limited cardiac output due to pulmonary HTN and RV failure are likely the cause for failure to respond to diuretics and worsening renal function. Known CAD, but little to suggest this is playing a role in current illness. Acute on chronic renal insufficiency and worsening hyponatremia complicates the situation further. Fluid restriction. Will try another dose of metolazone. Caution with renal function and K.  Sanda Klein, MD, [facility] [phone] office [phone] pager 03/20/2015 9:31 AM

## 2015-03-21 NOTE — Progress Notes (Signed)
PT Cancellation Note  Patient Details Name: SANDREA BOER MRN: [id number] DOB: 07-Apr-1934   Cancelled Treatment:    Reason Eval/Treat Not Completed: Patient at procedure or test/unavailable.   Pt scheduled to go for heart cath and waiting on transport at this time. Will check back tomorrow.   Tamika Shropshire LUBECK 03/21/2015, 12:47 PM

## 2015-03-21 NOTE — Interval H&P Note (Signed)
History and Physical Interval Note:  03/21/2015 1:54 PM  [name]  has presented today for surgery, with the diagnosis of cp  The various methods of treatment have been discussed with the patient and family. After consideration of risks, benefits and other options for treatment, the patient has consented to  Procedure(s): Right Heart Cath (N/A) as a surgical intervention .  The patient's history has been reviewed, patient examined, no change in status, stable for surgery.  I have reviewed the patient's chart and labs.  Questions were answered to the patient's satisfaction.     Bensimhon, Quillian Quince

## 2015-03-21 NOTE — Progress Notes (Signed)
Patient: [name] / Admit Date: 03/16/2015 / Date of Encounter: 03/21/2015, 11:06 AM   Subjective: UOP picked up significantly - SOB improving. Patient reports she can lie flat for RHC.   Objective: Telemetry: atrial fib - rates elevated most of yesterday but currently 80s-90s. Had bradycardia this admission. Physical Exam: Blood pressure 132/65, pulse 77, temperature 98 F (36.7 C), temperature source Oral, resp. rate 18, height 5\' 1"  (1.549 m), weight 183 lb 6.4 oz (83.19 kg), last menstrual period 08/13/1957, SpO2 93 %. General: Well developed, obese WF, in no acute distress.  Head: Normocephalic, atraumatic, sclera non-icteric, no xanthomas, nares are without discharge. Neck:  JVP moderately elevated. Lungs: No wheezes, rales, or rhonchi. Breathing is unlabored. Heart: Irregularly irregular, rate controlled, S1 S2 without murmurs, rubs, or gallops.  Abdomen: Soft, non-tender, non-distended with normoactive bowel sounds. No rebound/guarding. Extremities: No clubbing or cyanosis. No significant edema. +varicose veins. Distal pedal pulses are 2+ and equal bilaterally. Neuro: Alert and oriented X 3. Moves all extremities spontaneously. Psych:  Responds to questions appropriately with a normal affect.   Intake/Output Summary (Last 24 hours) at 03/21/15 1106 Last data filed at 03/21/15 0600  Gross per 24 hour  Intake   1080 ml  Output   4352 ml  Net  -3272 ml    Inpatient Medications:  . albuterol  2.5 mg Nebulization TID  . allopurinol  100 mg Oral BID  . [START ON 03/22/2015] aspirin  81 mg Oral Pre-Cath  . budesonide  0.25 mg Nebulization Daily  . furosemide  40 mg Intravenous BID  . guaiFENesin  600 mg Oral BID  . insulin aspart  0-15 Units Subcutaneous TID WC  . insulin aspart  0-5 Units Subcutaneous QHS  . insulin glargine  20 Units Subcutaneous BID  . isosorbide mononitrate  30 mg Oral Daily  . levothyroxine  25 mcg Oral Once per day on Mon Wed Fri  .  levothyroxine  50 mcg Oral Once per day on Sun Tue Thu Sat  . methocarbamol  500 mg Oral QHS  . metoprolol  50 mg Oral BID  . oxybutynin  10 mg Oral Daily  . polyethylene glycol  17 g Oral Daily  . predniSONE  40 mg Oral Q breakfast  . senna-docusate  1 tablet Oral BID  . sodium chloride  3 mL Intravenous Q12H  . sodium chloride  3 mL Intravenous Q12H   Infusions:  . [START ON 03/22/2015] sodium chloride      Labs:  Recent Labs  03/20/15 0633 03/21/15 0449  NA 127* 131*  K 5.2* 4.1  CL 88* 88*  CO2 24 34*  GLUCOSE 409* 275*  BUN 77* 72*  CREATININE 2.43* 2.12*  CALCIUM 8.6* 8.8*   No results for input(s): AST, ALT, ALKPHOS, BILITOT, PROT, ALBUMIN in the last 72 hours.  Recent Labs  03/20/15 0633 03/21/15 0449  WBC 10.8* 8.7  HGB 12.7 12.5  HCT 37.8 39.1  MCV 92.9 95.8  PLT 166 170   No results for input(s): CKTOTAL, CKMB, TROPONINI in the last 72 hours. Invalid input(s): POCBNP No results for input(s): HGBA1C in the last 72 hours.   Radiology/Studies:  Dg Chest 2 View  03/18/2015   CLINICAL DATA:  Cough and nausea for 2 months, dyspnea, coronary artery disease, CHF, atrial fibrillation, COPD, diabetes mellitus, coronary artery disease, former smoker  EXAM: CHEST  2 VIEW  COMPARISON:  03/16/2015  FINDINGS: Enlargement of cardiac silhouette with vascular congestion.  Atherosclerotic calcification  aorta.  Peribronchial thickening with chronic accentuation of interstitial markings little changed.  Mitral annular calcification noted.  Bibasilar atelectasis.  No pulmonary infiltrate, pleural effusion or pneumothorax.  Osseous demineralization with compression deformity of a vertebral body at the thoracolumbar junction question T12 unchanged.  RIGHT shoulder prosthesis with LEFT glenohumeral degenerative changes noted.  Surgical clips RIGHT upper quadrant likely prior cholecystectomy.  IMPRESSION: Chronic bronchitic and interstitial changes with bibasilar atelectasis.   Enlargement of cardiac silhouette with pulmonary vascular congestion.   Electronically Signed   By: Lavonia Dana M.D.   On: 03/18/2015 10:14   Dg Chest 2 View  03/16/2015   CLINICAL DATA:  Fall today. Left sided chest pain and shortness of breath. Initial encounter. COPD and congestive heart failure.  EXAM: CHEST  2 VIEW  COMPARISON:  Seven hundred fourteen  FINDINGS: Mild to moderate cardiomegaly remains stable. Both lungs are clear. No evidence of pulmonary infiltrate or edema. No evidence of pneumothorax or pleural effusion. Right shoulder prosthesis again noted.  IMPRESSION: Stable cardiomegaly.  No active lung disease.   Electronically Signed   By: Earle Gell M.D.   On: 03/16/2015 20:45   Ct Head Wo Contrast  03/16/2015   CLINICAL DATA:  Tripped on carpet. Hit head on site of table. Headache, scalp hematoma, and neck pain.  EXAM: CT HEAD WITHOUT CONTRAST  CT CERVICAL SPINE WITHOUT CONTRAST  TECHNIQUE: Multidetector CT imaging of the head and cervical spine was performed following the standard protocol without intravenous contrast. Multiplanar CT image reconstructions of the cervical spine were also generated.  COMPARISON:  Head CT on 12/05/2014 and cervical spine CT on 12/31/2009  FINDINGS: CT HEAD FINDINGS  There is no evidence of intracranial hemorrhage, brain edema, or other signs of acute infarction. There is no evidence of intracranial mass lesion or mass effect. No abnormal extraaxial fluid collections are identified.  Mild to moderate chronic small vessel disease noted. No evidence of hydrocephalus. No evidence of skull fracture or pneumocephalus.  CT CERVICAL SPINE FINDINGS  No evidence of acute fracture, subluxation, or prevertebral soft tissue swelling.  Cervical degenerative disc disease is seen at most levels which is most pronounced at C4-5, C5-6, and C6-7. Mild to moderate facet DJD is also seen bilaterally at these levels. Atlantoaxial degenerative changes also noted. Generalized osteopenia  demonstrated.  IMPRESSION: No acute intracranial abnormality. Mild to moderate chronic small vessel disease.  No evidence of acute cervical spine fracture or subluxation. Degenerative spondylosis, as described above. Osteopenia.   Electronically Signed   By: Earle Gell M.D.   On: 03/16/2015 21:28   Ct Cervical Spine Wo Contrast  03/16/2015   CLINICAL DATA:  Tripped on carpet. Hit head on site of table. Headache, scalp hematoma, and neck pain.  EXAM: CT HEAD WITHOUT CONTRAST  CT CERVICAL SPINE WITHOUT CONTRAST  TECHNIQUE: Multidetector CT imaging of the head and cervical spine was performed following the standard protocol without intravenous contrast. Multiplanar CT image reconstructions of the cervical spine were also generated.  COMPARISON:  Head CT on 12/05/2014 and cervical spine CT on 12/31/2009  FINDINGS: CT HEAD FINDINGS  There is no evidence of intracranial hemorrhage, brain edema, or other signs of acute infarction. There is no evidence of intracranial mass lesion or mass effect. No abnormal extraaxial fluid collections are identified.  Mild to moderate chronic small vessel disease noted. No evidence of hydrocephalus. No evidence of skull fracture or pneumocephalus.  CT CERVICAL SPINE FINDINGS  No evidence of acute fracture, subluxation, or  prevertebral soft tissue swelling.  Cervical degenerative disc disease is seen at most levels which is most pronounced at C4-5, C5-6, and C6-7. Mild to moderate facet DJD is also seen bilaterally at these levels. Atlantoaxial degenerative changes also noted. Generalized osteopenia demonstrated.  IMPRESSION: No acute intracranial abnormality. Mild to moderate chronic small vessel disease.  No evidence of acute cervical spine fracture or subluxation. Degenerative spondylosis, as described above. Osteopenia.   Electronically Signed   By: Earle Gell M.D.   On: 03/16/2015 21:28     Assessment and Plan  21F with CAD, PAF, chronic respiratory failure on home O2/chronic  lung disease, bradycardia, OSA< prior DVT, chronic diastolic CHF, HTN, renal artery stenosis, DM who presented 8/3 with SOB and fall after losing balance. Found to have severe pulm HTN and RHF. Echo 03/17/15: EF 60-65%, no RWMA, severely calcified mitral annulus, mod dilated LA, severely dilated RV with moderately reduced RV function, mod TR, severe RAE, severe pulm HTN PASP 38mmHg.  1. Acute on chronic diastolic CHF/right heart failure with worsening pulm HTN - Severe pulmonary HTN and secondary right heart failure with multiple possible causes. COPD, OSA, possible diastolic left heart failure (unable to evaluate on echo due to arrhythmia, suggested by elevated BNP). CTEP unlikely (already on anticoagulant for atrial fibrillation). Additional dose of metolazone did help to facilitate significant diuresis yesterday (5.3L UOP for a net -4.9 thus far), -5lb from yesterday. Plan RHC today with Dr. Haroldine Laws to further guide therapy. For some reason the precath orders entered over the weekend were defaulted to Tuesday 8/9. As a result the patient had breakfast this AM - I confirmed with cath lab we are still OK to go ahead this afternoon. Patient has been NPO since breakfast.  2. Chronic lung disease on home O2, also hx of OSA - likely contributing to above.  3. Paroxysmal atrial fibrillation, on Eliquis PTA - managed with rate control strategy only. Back in AF this admission. Sotalol previously d/c'd due to renal insufficiency and amiodarone previously stopped due to nausea. Patient declined prior hospitalization for Tikosyn in the past and was not a candidate for flecainide due to CAD. Had bradycardia earlier this admission; rates now 80s-90s.  4. H/o CAD s/p stenting of LAD 2008.  5. AKI on CKD stage III, Cr 2.74 peak this admission (1.5-1.7 earlier this year) - improved this AM with diuresis.  6. Diabetes mellitus - CBGs quite uncontrolled. Further per IM.  Signed, Melina Copa PA-C Pager:  [phone]

## 2015-03-21 NOTE — Care Management Important Message (Signed)
Important Message  Patient Details  Name: [name] MRN: [id number] Date of Birth: 1934/04/04   Medicare Important Message Given:  Yes-second notification given    [name] 03/21/2015, 2:58 PM

## 2015-03-21 NOTE — Progress Notes (Signed)
Inpatient Diabetes Program Recommendations  AACE/ADA: New Consensus Statement on Inpatient Glycemic Control (2013)  Target Ranges:  Prepandial:   less than 140 mg/dL      Peak postprandial:   less than 180 mg/dL (1-2 hours)      Critically ill patients:  140 - 180 mg/dL   Results for CASSAUNDRA, RASCH (MRN [id number]) as of 03/21/2015 10:07  Ref. Range 03/20/2015 07:39 03/20/2015 11:18 03/20/2015 16:06 03/20/2015 21:33 03/21/2015 06:25  Glucose-Capillary Latest Ref Range: 65-99 mg/dL 385 (H) 306 (H) 252 (H) 386 (H) 231 (H)    Diabetes history: DM2 Outpatient Diabetes medications: Lantus 20 units QAM, Lantus 35 units QPM, Humalog 2 units with breakfast and lunch Current orders for Inpatient glycemic control: Lantus 20 units BID, Novolog 0-15 units TID with meals, Novolog 0-5 units HS  Inpatient Diabetes Program Recommendations Insulin - Basal: Please consider increasing Lantus to 25 units BID. Insulin - Meal Coverage: Please consider ordering Novolog 6 units TID with meals for meal coverage since post prandial glucose is consistently elevated.  Thanks, Barnie Alderman, RN, MSN, CCRN, CDE Diabetes Coordinator Inpatient Diabetes Program [phone] (Team Pager from [place] to [place]) [phone] ([facility] office) [phone] [facility] office) [phone] [facility] office)

## 2015-03-22 DIAGNOSIS — J411 Mucopurulent chronic bronchitis: Secondary | ICD-10-CM

## 2015-03-22 LAB — CBC
HCT: 37.9 % (ref 36.0–46.0)
HEMOGLOBIN: 12 g/dL (ref 12.0–15.0)
MCH: 30.1 pg (ref 26.0–34.0)
MCHC: 31.7 g/dL (ref 30.0–36.0)
MCV: 95 fL (ref 78.0–100.0)
Platelets: 167 10*3/uL (ref 150–400)
RBC: 3.99 MIL/uL (ref 3.87–5.11)
RDW: 15.1 % (ref 11.5–15.5)
WBC: 6.5 10*3/uL (ref 4.0–10.5)

## 2015-03-22 LAB — GLUCOSE, CAPILLARY
GLUCOSE-CAPILLARY: 434 mg/dL — AB (ref 65–99)
Glucose-Capillary: 221 mg/dL — ABNORMAL HIGH (ref 65–99)
Glucose-Capillary: 193 mg/dL — ABNORMAL HIGH (ref 65–99)
Glucose-Capillary: 448 mg/dL — ABNORMAL HIGH (ref 65–99)
Glucose-Capillary: 379 mg/dL — ABNORMAL HIGH (ref 65–99)

## 2015-03-22 LAB — BASIC METABOLIC PANEL
Anion gap: 9 (ref 5–15)
BUN: 75 mg/dL — ABNORMAL HIGH (ref 6–20)
CO2: 37 mmol/L — ABNORMAL HIGH (ref 22–32)
Calcium: 9 mg/dL (ref 8.9–10.3)
Chloride: 89 mmol/L — ABNORMAL LOW (ref 101–111)
Creatinine, Ser: 1.94 mg/dL — ABNORMAL HIGH (ref 0.44–1.00)
GFR calc non Af Amer: 24 mL/min — ABNORMAL LOW (ref >60)
GFR, EST AFRICAN AMERICAN: 27 mL/min — AB (ref >60)
GLUCOSE: 260 mg/dL — AB (ref 65–99)
POTASSIUM: 3.6 mmol/L (ref 3.5–5.1)
Sodium: 135 mmol/L (ref 135–145)

## 2015-03-22 MED ORDER — DILTIAZEM HCL ER COATED BEADS 120 MG PO CP24
120.0000 mg | ORAL_CAPSULE | Freq: Every day | ORAL | Status: DC
  Administered 2015-03-22 – 2015-03-25 (×4): 120 mg via ORAL
  Filled 2015-03-22 (×4): qty 1

## 2015-03-22 MED ORDER — FLUTICASONE PROPIONATE 50 MCG/ACT NA SUSP
1.0000 | Freq: Every day | NASAL | Status: DC
  Administered 2015-03-22 – 2015-03-25 (×4): 1 via NASAL
  Filled 2015-03-22: qty 16

## 2015-03-22 MED ORDER — ALBUTEROL SULFATE (2.5 MG/3ML) 0.083% IN NEBU
2.5000 mg | INHALATION_SOLUTION | RESPIRATORY_TRACT | Status: DC | PRN
  Administered 2015-03-24: 2.5 mg via RESPIRATORY_TRACT
  Filled 2015-03-22: qty 3

## 2015-03-22 MED ORDER — FUROSEMIDE 40 MG PO TABS
40.0000 mg | ORAL_TABLET | Freq: Two times a day (BID) | ORAL | Status: DC
  Administered 2015-03-22 – 2015-03-25 (×6): 40 mg via ORAL
  Filled 2015-03-22 (×8): qty 1

## 2015-03-22 MED ORDER — INSULIN ASPART 100 UNIT/ML ~~LOC~~ SOLN
3.0000 [IU] | Freq: Three times a day (TID) | SUBCUTANEOUS | Status: DC
  Administered 2015-03-23 – 2015-03-25 (×8): 3 [IU] via SUBCUTANEOUS

## 2015-03-22 MED ORDER — LEVOFLOXACIN 250 MG PO TABS
250.0000 mg | ORAL_TABLET | Freq: Every day | ORAL | Status: DC
  Administered 2015-03-22 – 2015-03-25 (×4): 250 mg via ORAL
  Filled 2015-03-22 (×4): qty 1

## 2015-03-22 MED ORDER — INSULIN GLARGINE 100 UNIT/ML ~~LOC~~ SOLN
23.0000 [IU] | Freq: Two times a day (BID) | SUBCUTANEOUS | Status: DC
  Administered 2015-03-22 – 2015-03-25 (×6): 23 [IU] via SUBCUTANEOUS
  Filled 2015-03-22 (×7): qty 0.23

## 2015-03-22 MED FILL — Lidocaine HCl Local Preservative Free (PF) Inj 1%: INTRAMUSCULAR | Qty: 2 | Status: AC

## 2015-03-22 NOTE — Progress Notes (Signed)
[facility] PROGRESS NOTE  [name] XTG:[id number] DOB: 08/08/1934 DOA: 03/16/2015 PCP: Cari Caraway, MD  Assessment/Plan: [name] is a 81 y.o. female with Past medical history of COPD, chronic diastolic CHF, intermittent fibrillation, chronic anticoagulation, coronary artery disease, chronic hypoxic respiratory failure on 2 L of oxygen at home, chronic anemia, hypothyroidism. The patient is presenting with numbness of progressively worsening shortness of breath since last one week. She mentions she has gained a few pounds as well. She has been getting treatment for Acute on chronic diastolic HF exacerbation and CODP.  Cardiology consulted and helping with management of HF.   1. Acute on chronic diastolic heart failure: The patient is presenting with progressive worsening shortness of breath as well as orthopnea and PND. Chest shows mild vascular congestion. elevation of serum BNP. lasix  Change to 40 mg PO  BID.  Urine out pu 4 L on 8-8 Weight: -79---82--85---85---83---81 Cardiology consulted and following.  Right side Cath: 8-08; Moderate PAH with combination of left-sided and right-sided features. Moderately depressed cardiac output Received a dose of metolazone 8-7  2.Atrial fibrillation. Holding Apixaban on 8-7 anticipating cath 8-8 HR drop to 37 8-5. She received 360- Cardizem  BID 8-04. I reviewed meds with patient she take 180 mg BID of Cardizem. If she get 360 tablet she only takes once a day.  Holder parameter for metoprolol.  Plan to resume lower dose cardizem today/   3.Chronic hypoxia with COPD and OSA. Continue close monitoring. Schedule nebulizer.  Prednisone taper  Relates worsening cough, congestion. Will add Levaquin.   4.Hypothyroidism. Continue Synthroid.  5.Overactive bladder. Continue home medication.  6. Diabetes mellitus. Continue sliding scale insulin and  Lantus Taper steroids.  Increase lantus to 23 units BID.    7-AKI: CKD stage III; monitor on IV lasix.  Cr decreased to-1.9----2.2--- 2.4 from 2.7  Hyperkalemia;  Resolved.   Constipation: had BM. Continue with laxatives.   Code Status: Full Code.  Family Communication: care discussed with patient.  Disposition Plan: Remain inpatient, PT evaluation. Home in 24 to 48 hours if [facility] with cardiology/    Consultants:  none  Procedures:  ECHO; Left ventricle: The cavity size was normal. There was mild concentric hypertrophy. Systolic function was normal. The estimated ejection fraction was in the range of 60% to 65%. Wall motion was normal; there were no regional wall motion abnormalities. - Aortic valve: There was trivial regurgitation. - Mitral valve: Severely calcified annulus. - Left atrium: The atrium was moderately dilated. - Right ventricle: The cavity size was severely dilated. Systolic function was moderately reduced. - Right atrium: The atrium was severely dilated. - Tricuspid valve: There was moderate regurgitation. - Pulmonary arteries: Systolic pressure was severely increased. PA peak pressure: 68 mm Hg (S).  Antibiotics:  none  HPI/Subjective: She is feeling well. Feels more congested in her chest, sinus congestion also/    Objective: Filed Vitals:   03/22/15 1426  BP: 110/46  Pulse: 70  Temp: 98.5 F (36.9 C)  Resp: 18    Intake/Output Summary (Last 24 hours) at 03/22/15 1501 Last data filed at 03/22/15 1426  Gross per 24 hour  Intake   1360 ml  Output   4475 ml  Net  -3115 ml   Filed Weights   03/20/15 0517 03/21/15 0434 03/22/15 0346  Weight: 85.639 kg (188 lb 12.8 oz) 83.19 kg (183 lb 6.4 oz) 81.058 kg (178 lb 11.2 oz)    Exam:   General:  Alert in no distress.  Cardiovascular: S 1, S 2 RRR  Respiratory: Bilateral crackles.   Abdomen: bs presents, soft, nt  Musculoskeletal: bilateral LE edema/   Data Reviewed: Basic Metabolic Panel:  Recent Labs Lab 03/19/15 0345  03/20/15 0633 03/21/15 0449 03/21/15 2250 03/22/15 0418  NA 128* 127* 131* 131* 135  K 4.6 5.2* 4.1 4.0 3.6  CL 88* 88* 88* 83* 89*  CO2 26 24 34* 36* 37*  GLUCOSE 454* 409* 275* 539* 260*  BUN 49* 77* 72* 74* 75*  CREATININE 2.74* 2.43* 2.12* 2.02* 1.94*  CALCIUM 8.1* 8.6* 8.8* 8.8* 9.0   Liver Function Tests: No results for input(s): AST, ALT, ALKPHOS, BILITOT, PROT, ALBUMIN in the last 168 hours. No results for input(s): LIPASE, AMYLASE in the last 168 hours. No results for input(s): AMMONIA in the last 168 hours. CBC:  Recent Labs Lab 03/17/15 0645 03/18/15 0443 03/19/15 0345 03/20/15 0633 03/21/15 0449 03/22/15 0418  WBC 7.2 6.5 6.1 10.8* 8.7 6.5  NEUTROABS 5.3  --   --   --   --   --   HGB 11.6* 11.9* 11.4* 12.7 12.5 12.0  HCT 37.3 38.7 35.9* 37.8 39.1 37.9  MCV 97.4 96.8 95.0 92.9 95.8 95.0  PLT 142* 149* 145* 166 170 167   Cardiac Enzymes:  Recent Labs Lab 03/16/15 1944 03/17/15 0645 03/17/15 1640 03/17/15 1729  TROPONINI <0.03 <0.03 <0.03 <0.03   BNP (last 3 results)  Recent Labs  12/05/14 2250 03/16/15 1924  BNP 1143.9* 1109.9*    ProBNP (last 3 results) No results for input(s): PROBNP in the last 8760 hours.  CBG:  Recent Labs Lab 03/21/15 1138 03/21/15 1705 03/21/15 2113 03/22/15 0557 03/22/15 1153  GLUCAP 269* 364* 518* 221* 193*    No results found for this or any previous visit (from the past 240 hour(s)).   Studies: No results found.  Scheduled Meds: . albuterol  2.5 mg Nebulization TID  . allopurinol  100 mg Oral BID  . apixaban  5 mg Oral BID  . budesonide  0.25 mg Nebulization Daily  . diltiazem  120 mg Oral Daily  . fluticasone  1 spray Each Nare Daily  . furosemide  40 mg Oral BID  . guaiFENesin  600 mg Oral BID  . insulin aspart  0-15 Units Subcutaneous TID WC  . insulin aspart  0-5 Units Subcutaneous QHS  . insulin glargine  23 Units Subcutaneous BID  . isosorbide mononitrate  30 mg Oral Daily  .  levofloxacin  250 mg Oral Daily  . levothyroxine  25 mcg Oral Once per day on Mon Wed Fri  . levothyroxine  50 mcg Oral Once per day on Sun Tue Thu Sat  . methocarbamol  500 mg Oral QHS  . metoprolol  50 mg Oral BID  . oxybutynin  10 mg Oral Daily  . polyethylene glycol  17 g Oral Daily  . predniSONE  40 mg Oral Q breakfast  . senna-docusate  1 tablet Oral BID  . sodium chloride  3 mL Intravenous Q12H  . sodium chloride  3 mL Intravenous Q12H  . sodium chloride  3 mL Intravenous Q12H   Continuous Infusions:    Principal Problem:   Acute on chronic diastolic heart failure Active Problems:   Hypothyroidism   Obstructive sleep apnea   Atrial fibrillation   COPD (chronic obstructive pulmonary disease)   Secondary pulmonary hypertension   Overactive bladder   Type 2 diabetes mellitus with other circulatory complications    Time spent:  25 minutes.     Niel Hummer A  [facility] Pager [phone]. If 7PM-7AM, please contact night-coverage at [email or web address], password [facility] 03/22/2015, 3:01 PM  LOS: 6 days

## 2015-03-22 NOTE — Progress Notes (Signed)
Patient: [name] / Admit Date: 03/16/2015 / Date of Encounter: 03/22/2015, 10:36 AM   Subjective: She feels SOB has improved but ++mucusy-sounding cough today. Reports chronic orthopnea. No LEE.  Objective: Telemetry: atrial fib, slightly elevated HR 95-110 Physical Exam: Blood pressure 109/72, pulse 95, temperature 97.7 F (36.5 C), temperature source Oral, resp. rate 20, height 5\' 1"  (1.549 m), weight 178 lb 11.2 oz (81.058 kg), last menstrual period 08/13/1957, SpO2 99 %. General: Well developed, obese WF, in no acute distress.  Head: Normocephalic, atraumatic, sclera non-icteric, no xanthomas, nares are without discharge. Neck: JVP mildly elevated. Lungs: Coarse bilaterally with scattered rhonchi, no wheezing, no rales. Heart: Irregularly irregular, rate controlled, S1 S2 without murmurs, rubs, or gallops.  Abdomen: Soft, non-tender, non-distended with normoactive bowel sounds. No rebound/guarding. Extremities: No clubbing or cyanosis. No significant edema. +varicose veins. Distal pedal pulses are 2+ and equal bilaterally. Neuro: Alert and oriented X 3. Moves all extremities spontaneously. Psych: Responds to questions appropriately with a normal affect.   Intake/Output Summary (Last 24 hours) at 03/22/15 1036 Last data filed at 03/22/15 0859  Gross per 24 hour  Intake   1240 ml  Output   3975 ml  Net  -2735 ml    Inpatient Medications:  . albuterol  2.5 mg Nebulization TID  . allopurinol  100 mg Oral BID  . apixaban  5 mg Oral BID  . budesonide  0.25 mg Nebulization Daily  . furosemide  40 mg Intravenous BID  . guaiFENesin  600 mg Oral BID  . insulin aspart  0-15 Units Subcutaneous TID WC  . insulin aspart  0-5 Units Subcutaneous QHS  . insulin glargine  20 Units Subcutaneous BID  . isosorbide mononitrate  30 mg Oral Daily  . levothyroxine  25 mcg Oral Once per day on Mon Wed Fri  . levothyroxine  50 mcg Oral Once per day on Sun Tue Thu Sat  .  methocarbamol  500 mg Oral QHS  . metoprolol  50 mg Oral BID  . oxybutynin  10 mg Oral Daily  . polyethylene glycol  17 g Oral Daily  . predniSONE  40 mg Oral Q breakfast  . senna-docusate  1 tablet Oral BID  . sodium chloride  3 mL Intravenous Q12H  . sodium chloride  3 mL Intravenous Q12H  . sodium chloride  3 mL Intravenous Q12H   Infusions:    Labs:  Recent Labs  03/21/15 2250 03/22/15 0418  NA 131* 135  K 4.0 3.6  CL 83* 89*  CO2 36* 37*  GLUCOSE 539* 260*  BUN 74* 75*  CREATININE 2.02* 1.94*  CALCIUM 8.8* 9.0   No results for input(s): AST, ALT, ALKPHOS, BILITOT, PROT, ALBUMIN in the last 72 hours.  Recent Labs  03/21/15 0449 03/22/15 0418  WBC 8.7 6.5  HGB 12.5 12.0  HCT 39.1 37.9  MCV 95.8 95.0  PLT 170 167   No results for input(s): CKTOTAL, CKMB, TROPONINI in the last 72 hours. Invalid input(s): POCBNP No results for input(s): HGBA1C in the last 72 hours.   Radiology/Studies:  Dg Chest 2 View  03/18/2015   CLINICAL DATA:  Cough and nausea for 2 months, dyspnea, coronary artery disease, CHF, atrial fibrillation, COPD, diabetes mellitus, coronary artery disease, former smoker  EXAM: CHEST  2 VIEW  COMPARISON:  03/16/2015  FINDINGS: Enlargement of cardiac silhouette with vascular congestion.  Atherosclerotic calcification aorta.  Peribronchial thickening with chronic accentuation of interstitial markings little changed.  Mitral annular  calcification noted.  Bibasilar atelectasis.  No pulmonary infiltrate, pleural effusion or pneumothorax.  Osseous demineralization with compression deformity of a vertebral body at the thoracolumbar junction question T12 unchanged.  RIGHT shoulder prosthesis with LEFT glenohumeral degenerative changes noted.  Surgical clips RIGHT upper quadrant likely prior cholecystectomy.  IMPRESSION: Chronic bronchitic and interstitial changes with bibasilar atelectasis.  Enlargement of cardiac silhouette with pulmonary vascular congestion.    Electronically Signed   By: Lavonia Dana M.D.   On: 03/18/2015 10:14   Dg Chest 2 View  03/16/2015   CLINICAL DATA:  Fall today. Left sided chest pain and shortness of breath. Initial encounter. COPD and congestive heart failure.  EXAM: CHEST  2 VIEW  COMPARISON:  Seven hundred fourteen  FINDINGS: Mild to moderate cardiomegaly remains stable. Both lungs are clear. No evidence of pulmonary infiltrate or edema. No evidence of pneumothorax or pleural effusion. Right shoulder prosthesis again noted.  IMPRESSION: Stable cardiomegaly.  No active lung disease.   Electronically Signed   By: Earle Gell M.D.   On: 03/16/2015 20:45   Ct Head Wo Contrast  03/16/2015   CLINICAL DATA:  Tripped on carpet. Hit head on site of table. Headache, scalp hematoma, and neck pain.  EXAM: CT HEAD WITHOUT CONTRAST  CT CERVICAL SPINE WITHOUT CONTRAST  TECHNIQUE: Multidetector CT imaging of the head and cervical spine was performed following the standard protocol without intravenous contrast. Multiplanar CT image reconstructions of the cervical spine were also generated.  COMPARISON:  Head CT on 12/05/2014 and cervical spine CT on 12/31/2009  FINDINGS: CT HEAD FINDINGS  There is no evidence of intracranial hemorrhage, brain edema, or other signs of acute infarction. There is no evidence of intracranial mass lesion or mass effect. No abnormal extraaxial fluid collections are identified.  Mild to moderate chronic small vessel disease noted. No evidence of hydrocephalus. No evidence of skull fracture or pneumocephalus.  CT CERVICAL SPINE FINDINGS  No evidence of acute fracture, subluxation, or prevertebral soft tissue swelling.  Cervical degenerative disc disease is seen at most levels which is most pronounced at C4-5, C5-6, and C6-7. Mild to moderate facet DJD is also seen bilaterally at these levels. Atlantoaxial degenerative changes also noted. Generalized osteopenia demonstrated.  IMPRESSION: No acute intracranial abnormality. Mild to  moderate chronic small vessel disease.  No evidence of acute cervical spine fracture or subluxation. Degenerative spondylosis, as described above. Osteopenia.   Electronically Signed   By: Earle Gell M.D.   On: 03/16/2015 21:28   Ct Cervical Spine Wo Contrast  03/16/2015   CLINICAL DATA:  Tripped on carpet. Hit head on site of table. Headache, scalp hematoma, and neck pain.  EXAM: CT HEAD WITHOUT CONTRAST  CT CERVICAL SPINE WITHOUT CONTRAST  TECHNIQUE: Multidetector CT imaging of the head and cervical spine was performed following the standard protocol without intravenous contrast. Multiplanar CT image reconstructions of the cervical spine were also generated.  COMPARISON:  Head CT on 12/05/2014 and cervical spine CT on 12/31/2009  FINDINGS: CT HEAD FINDINGS  There is no evidence of intracranial hemorrhage, brain edema, or other signs of acute infarction. There is no evidence of intracranial mass lesion or mass effect. No abnormal extraaxial fluid collections are identified.  Mild to moderate chronic small vessel disease noted. No evidence of hydrocephalus. No evidence of skull fracture or pneumocephalus.  CT CERVICAL SPINE FINDINGS  No evidence of acute fracture, subluxation, or prevertebral soft tissue swelling.  Cervical degenerative disc disease is seen at most levels which  is most pronounced at C4-5, C5-6, and C6-7. Mild to moderate facet DJD is also seen bilaterally at these levels. Atlantoaxial degenerative changes also noted. Generalized osteopenia demonstrated.  IMPRESSION: No acute intracranial abnormality. Mild to moderate chronic small vessel disease.  No evidence of acute cervical spine fracture or subluxation. Degenerative spondylosis, as described above. Osteopenia.   Electronically Signed   By: Earle Gell M.D.   On: 03/16/2015 21:28     Assessment and Plan  57F with CAD, PAF, chronic respiratory failure on home O2/chronic lung disease, bradycardia, OSA< prior DVT, chronic diastolic CHF, HTN,  renal artery stenosis, DM who presented 8/3 with SOB and fall after losing balance. Found to have severe pulm HTN and RHF. Echo 03/17/15: EF 60-65%, no RWMA, severely calcified mitral annulus, mod dilated LA, severely dilated RV with moderately reduced RV function, mod TR, severe RAE, severe pulm HTN PASP 89mmHg.  1. Acute on chronic diastolic CHF/right heart failure with worsening pulm HTN - Severe pulmonary HTN and secondary right heart failure with multiple possible causes. COPD, OSA, possible diastolic left heart failure (unable to evaluate on echo due to arrhythmia, suggested by elevated BNP). CTEP unlikely (already on anticoagulant for atrial fibrillation). [place] 03/21/15 showed moderate PAH with combination of L & R sided features, moderately depressed cardiac output. Symptomatically she is improved but still reports significant mucusy-sounding cough. Has diuresed well. ?Will discuss changing back to oral Lasix with Dr. Claiborne Billings. BUN remains in the 70s, above baseline.  2. Chronic lung disease on home O2, also hx of OSA - likely contributing to above. Will need assessment of oxygen needs prior to d/c in case requirement has increased. Management of acute exacerbation per IM.  3. Paroxysmal atrial fibrillation, on Eliquis PTA - managed with rate control strategy only. Back in AF this admission. Sotalol previously d/c'd due to renal insufficiency and amiodarone previously stopped due to nausea. Patient declined prior hospitalization for Tikosyn in the past and was not a candidate for flecainide due to CAD. Had bradycardia earlier this admission; rates now 90s-100s. Consider resuming low dose diltiazem if HR remains elevated - may be up currently due to coughing.  4. H/o CAD s/p stenting of LAD 2008.  5. AKI on CKD stage III, Cr 2.74 peak this admission (1.5-1.7 earlier this year) - Cr improving with diuresis. Sodium also improved.  6. Diabetes mellitus - CBGs quite uncontrolled. Further per  IM.  Signed, Melina Copa PA-C Pager: [phone]  Patient seen and examined. Agree with assessment and plan. R heart cath reviewed. PA pressure 65/30, PCWP 20; CI 2.  I/O since admission -9165.  Raspy cough. Will add mucinex. AF rate in the mid 90's; will resume cardizem at 120 mg and switch to oral furosemide.    Troy Sine, MD, [facility] 03/22/2015 12:30 PM

## 2015-03-22 NOTE — Progress Notes (Signed)
Paged Dr. Tyrell Antonio regarding Pt CBG result of 434. Rechecked CBG and it was 448. New Orders received to give 16 Units Novolog now. Will continue to monitor pt.       Maurene Capes RN

## 2015-03-22 NOTE — Progress Notes (Signed)
Inpatient Diabetes Program Recommendations  AACE/ADA: New Consensus Statement on Inpatient Glycemic Control (2013)  Target Ranges:  Prepandial:   less than 140 mg/dL      Peak postprandial:   less than 180 mg/dL (1-2 hours)      Critically ill patients:  140 - 180 mg/dL   Results for [name], [name] (MRN [id number]) as of 03/22/2015 13:38  Ref. Range 03/21/2015 06:25 03/21/2015 11:38 03/21/2015 17:05 03/21/2015 21:13 03/22/2015 05:57 03/22/2015 11:53  Glucose-Capillary Latest Ref Range: 65-99 mg/dL 231 (H) 269 (H) 364 (H) 518 (H) 221 (H) 193 (H)   Diabetes history: DM2 Outpatient Diabetes medications: Lantus 20 units QAM, Lantus 35 units QPM, Humalog 2 units with breakfast and lunch Current orders for Inpatient glycemic control: Lantus 20 units BID, Novolog 0-15 units TID with meals, Novolog 0-5 units HS  Inpatient Diabetes Program Recommendations Insulin - Basal: If steroids are continued as ordered, please consider increasing Lantus to 23 units BID. Insulin - Meal Coverage: If steroids are continued as ordered, please consider ordering Novolog 4 units TID with meals for meal coverage since post prandial glucose is consistently elevated.  Thanks, Barnie Alderman, RN, MSN, CCRN, CDE Diabetes Coordinator Inpatient Diabetes Program [phone] (Team Pager from [place] to [place]) [phone] ([facility] office) [phone] [facility] office) [phone] [facility] office)

## 2015-03-22 NOTE — Progress Notes (Signed)
Physical Therapy Treatment Patient Details Name: GERARDA CONKLIN MRN: [id number] DOB: April 04, 1934 Today's Date: 03/22/2015    History of Present Illness 81 yo admitted with fall, SOB and CHF exacerbation. PMHx of COPD on home O2    PT Comments    Pt able to ambulate 200' with RW and S on o2 with reports of fatigue and required 1 standing rest break today with 2/4 dyspnea.  Follow Up Recommendations  No PT follow up     Equipment Recommendations  None recommended by PT    Recommendations for Other Services       Precautions / Restrictions Precautions Precautions: Fall Precaution Comments: o2 dependent Restrictions Weight Bearing Restrictions: No    Mobility  Bed Mobility Overal bed mobility: Modified Independent                Transfers Overall transfer level: Modified independent Equipment used: Rolling walker (2 wheeled)             General transfer comment: Able to stand from bed and toilet with MOD I  Ambulation/Gait Ambulation/Gait assistance: Supervision Ambulation Distance (Feet): 200 Feet Assistive device: Rolling walker (2 wheeled) Gait Pattern/deviations: Step-through pattern Gait velocity: WNL   General Gait Details: Pt had to take 1 standing rest break midway through gait with coughing fit.  She reported feeling lightheaded with coughing, but once she stopped felt better.   Stairs            Wheelchair Mobility    Modified Rankin (Stroke Patients Only)       Balance Overall balance assessment: Needs assistance           Standing balance-Leahy Scale: Fair Standing balance comment: Fair static and poor dynamic                    Cognition Arousal/Alertness: Awake/alert Behavior During Therapy: WFL for tasks assessed/performed Overall Cognitive Status: Within Functional Limits for tasks assessed                      Exercises      General Comments General comments (skin integrity, edema, etc.):  Pt reported being up all AM and fatigued from bath.  Requesting to return to bed after gait.  Verbally reviewed home therex program.      Pertinent Vitals/Pain Pain Assessment: No/denies pain    Home Living                      Prior Function            PT Goals (current goals can now be found in the care plan section) Acute Rehab PT Goals Patient Stated Goal: return home PT Goal Formulation: With patient Time For Goal Achievement: 03/31/15 Potential to Achieve Goals: Good Progress towards PT goals: Progressing toward goals    Frequency  Min 3X/week    PT Plan Current plan remains appropriate    Co-evaluation             End of Session Equipment Utilized During Treatment: Gait belt;Oxygen Activity Tolerance: Patient limited by fatigue;Patient tolerated treatment well Patient left: in bed;with call bell/phone within reach;with family/visitor present     Time: 7829-5621 PT Time Calculation (min) (ACUTE ONLY): 17 min  Charges:  $Gait Training: 8-22 mins                    G Codes:      Sunil Hue LUBECK 03/22/2015, 12:08 PM

## 2015-03-23 DIAGNOSIS — I482 Chronic atrial fibrillation: Secondary | ICD-10-CM

## 2015-03-23 DIAGNOSIS — J42 Unspecified chronic bronchitis: Secondary | ICD-10-CM

## 2015-03-23 DIAGNOSIS — E890 Postprocedural hypothyroidism: Secondary | ICD-10-CM

## 2015-03-23 LAB — BASIC METABOLIC PANEL
Anion gap: 10 (ref 5–15)
BUN: 66 mg/dL — ABNORMAL HIGH (ref 6–20)
CALCIUM: 9.1 mg/dL (ref 8.9–10.3)
CHLORIDE: 88 mmol/L — AB (ref 101–111)
CO2: 40 mmol/L — ABNORMAL HIGH (ref 22–32)
Creatinine, Ser: 1.62 mg/dL — ABNORMAL HIGH (ref 0.44–1.00)
GFR calc Af Amer: 34 mL/min — ABNORMAL LOW (ref >60)
GFR calc non Af Amer: 29 mL/min — ABNORMAL LOW (ref >60)
GLUCOSE: 160 mg/dL — AB (ref 65–99)
POTASSIUM: 3.5 mmol/L (ref 3.5–5.1)
Sodium: 138 mmol/L (ref 135–145)

## 2015-03-23 LAB — GLUCOSE, CAPILLARY
GLUCOSE-CAPILLARY: 119 mg/dL — AB (ref 65–99)
GLUCOSE-CAPILLARY: 243 mg/dL — AB (ref 65–99)
GLUCOSE-CAPILLARY: 261 mg/dL — AB (ref 65–99)
GLUCOSE-CAPILLARY: 342 mg/dL — AB (ref 65–99)

## 2015-03-23 MED ORDER — HYDROCOD POLST-CPM POLST ER 10-8 MG/5ML PO SUER: 5.0000 mL | Freq: Two times a day (BID) | ORAL | Status: DC | PRN

## 2015-03-23 MED ORDER — BUDESONIDE 0.25 MG/2ML IN SUSP
0.2500 mg | Freq: Two times a day (BID) | RESPIRATORY_TRACT | Status: DC
  Administered 2015-03-23 – 2015-03-24 (×3): 0.25 mg via RESPIRATORY_TRACT
  Filled 2015-03-23 (×6): qty 2

## 2015-03-23 NOTE — Progress Notes (Signed)
Patient Name: [name] Date of Encounter: 03/23/2015  Principal Problem:   Acute on chronic diastolic heart failure Active Problems:   Hypothyroidism   Obstructive sleep apnea   Atrial fibrillation   COPD (chronic obstructive pulmonary disease)   Secondary pulmonary hypertension   Overactive bladder   Type 2 diabetes mellitus with other circulatory complications   Primary Cardiologist: Dr Irish Lack  Patient Profile: [phone] with CAD, PAF, chronic respiratory failure on home O2/chronic lung disease, bradycardia, OSA< prior DVT, chronic diastolic CHF, HTN, renal artery stenosis, DM who presented 8/3 with SOB and fall after losing balance. Found to have severe pulm HTN and RHF. Echo 03/17/15: EF 60-65%, no RWMA, severely calcified mitral annulus, mod dilated LA, severely dilated RV with moderately reduced RV function, mod TR, severe RAE, severe pulm HTN PASP 70mmHg.  SUBJECTIVE: Breathing better, coughing up more phlegm  OBJECTIVE Filed Vitals:   03/22/15 2103 03/23/15 0551 03/23/15 0827 03/23/15 1100  BP: 152/72 145/94  125/67  Pulse: 112 102  89  Temp: 97.7 F (36.5 C) 97.5 F (36.4 C)  97.7 F (36.5 C)  TempSrc: Oral Oral  Oral  Resp: 20 18    Height:      Weight:  175 lb 1.6 oz (79.425 kg)    SpO2: 94% 93% 94% 94%    Intake/Output Summary (Last 24 hours) at 03/23/15 1312 Last data filed at 03/23/15 1100  Gross per 24 hour  Intake   1080 ml  Output   3050 ml  Net  -1970 ml   Filed Weights   03/21/15 0434 03/22/15 0346 03/23/15 0551  Weight: 183 lb 6.4 oz (83.19 kg) 178 lb 11.2 oz (81.058 kg) 175 lb 1.6 oz (79.425 kg)    PHYSICAL EXAM General: Well developed, well nourished, female in no acute distress. Head: Normocephalic, atraumatic.  Neck: Supple without bruits, JVD 8 cm. Lungs:  Resp regular and unlabored, few rales, has rhonchi and slight wheeze. Heart: irreg irreg, S1, S2, no S3, S4, or murmur; no rub. Abdomen: Soft, non-tender, non-distended,  BS + x 4.  Extremities: No clubbing, cyanosis, edema.  Neuro: Alert and oriented X 3. Moves all extremities spontaneously. Psych: Normal affect.  LABS: CBC: Recent Labs  03/21/15 0449 03/22/15 0418  WBC 8.7 6.5  HGB 12.5 12.0  HCT 39.1 37.9  MCV 95.8 95.0  PLT 170 599   Basic Metabolic Panel: Recent Labs  03/22/15 0418 03/23/15 0356  NA 135 138  K 3.6 3.5  CL 89* 88*  CO2 37* 40*  GLUCOSE 260* 160*  BUN 75* 66*  CREATININE 1.94* 1.62*  CALCIUM 9.0 9.1   BNP:  B NATRIURETIC PEPTIDE  Date/Time Value Ref Range Status  03/16/2015 07:24 PM 1109.9* 0.0 - 100.0 pg/mL Final  12/05/2014 10:50 PM 1143.9* 0.0 - 100.0 pg/mL Final   TELE:  Atrial fib, RVR at times  Current Medications:  . albuterol  2.5 mg Nebulization TID  . allopurinol  100 mg Oral BID  . apixaban  5 mg Oral BID  . budesonide  0.25 mg Nebulization Daily  . diltiazem  120 mg Oral Daily  . fluticasone  1 spray Each Nare Daily  . furosemide  40 mg Oral BID  . guaiFENesin  600 mg Oral BID  . insulin aspart  0-15 Units Subcutaneous TID WC  . insulin aspart  0-5 Units Subcutaneous QHS  . insulin aspart  3 Units Subcutaneous TID WC  . insulin glargine  23 Units Subcutaneous  BID  . isosorbide mononitrate  30 mg Oral Daily  . levofloxacin  250 mg Oral Daily  . levothyroxine  25 mcg Oral Once per day on Mon Wed Fri  . levothyroxine  50 mcg Oral Once per day on Sun Tue Thu Sat  . methocarbamol  500 mg Oral QHS  . metoprolol  50 mg Oral BID  . oxybutynin  10 mg Oral Daily  . polyethylene glycol  17 g Oral Daily  . predniSONE  40 mg Oral Q breakfast  . senna-docusate  1 tablet Oral BID  . sodium chloride  3 mL Intravenous Q12H  . sodium chloride  3 mL Intravenous Q12H  . sodium chloride  3 mL Intravenous Q12H      ASSESSMENT AND PLAN: 1. Acute on chronic diastolic CHF/right heart failure with worsening pulm HTN - Severe pulmonary HTN and secondary right heart failure with multiple possible causes. COPD,  OSA, possible diastolic left heart failure (unable to evaluate on echo due to arrhythmia, suggested by elevated BNP). CTEP unlikely (already on anticoagulant for atrial fibrillation). [place] 03/21/15 showed moderate PAH with combination of L & R sided features, moderately depressed cardiac output.  - Symptomatically she is improved but still reports significant mucusy-sounding cough, Mucinex added 08/09.  - Has diuresed well. Back on oral Lasix 08/09. BUN remains in the 60s, baseline 20s.  2. Chronic lung disease on home O2, also hx of OSA - likely contributing to above. Will need assessment of oxygen needs prior to d/c in case requirement has increased. Management of acute exacerbation per IM.  3. Paroxysmal atrial fibrillation, on Eliquis PTA - managed with rate control strategy only. Back in AF this admission. Sotalol previously d/c'd due to renal insufficiency and amiodarone previously stopped due to nausea. Patient declined prior hospitalization for Tikosyn in the past and was not a candidate for flecainide due to CAD. Had bradycardia earlier this admission; rates now 90s-100s. LA size 47 mm on echo. - low dose diltiazem added 08/09, HR improved. Think rate 80s-120s is acceptable since she has acute illness, is on steroids and getting nebs  4. H/o CAD s/p stenting of LAD 2008.  5. AKI on CKD stage III, Cr 2.74 peak this admission (1.5-1.7 earlier this year) - Cr improving with diuresis. Sodium also improved.  6. Diabetes mellitus - CBGs quite uncontrolled. Further per IM.  Otherwise, per IM Principal Problem:   Acute on chronic diastolic heart failure Active Problems:   Hypothyroidism   Obstructive sleep apnea   Atrial fibrillation   COPD (chronic obstructive pulmonary disease)   Secondary pulmonary hypertension   Overactive bladder   Type 2 diabetes mellitus with other circulatory complications   Signed, Barrett, Rhonda , PA-C 1:12 PM 03/23/2015    Patient seen and examined. Agree  with assessment and plan. I/O  [id number] since admission. Renal function is better today with Cr 1.62. Breathing better. Still with raspy cough; on levaquin. AF rate controlled on diltizem and metoprolol.   Troy Sine, MD, [facility] 03/23/2015 2:11 PM

## 2015-03-23 NOTE — Progress Notes (Signed)
[facility] PROGRESS NOTE  [name] ZOX:[id number] DOB: [birth year] DOA: 03/16/2015 PCP: Cari Caraway, MD  Assessment/Plan: Acute on chronic diastolic heart failure - Patient presented with worsening shortness of breath, orthopnea, and PND. BNP 1109. CXR showing pulmonary congestion. Underwent 2d echo and right heart catheterization. Cardiology following.  - Improving. Since admission, net output -11L, weight down to 175 lbs (181 lb on admit). Patient endorses slight improvement in breathing; however, still coughing yellow phlegm.  - Continue PO diuresing and follow   Atrial fibrillation CHADSVASC 7 - Currently anticoagulated with Eliquis.  - Rate controlled with metoprolol and cardizem, managing with cardiology  Chronic hypoxia with COPD and OSA - Patient 2L O2 at home. Initially on 3L [place], however titrated down to 2L and remains stable with O2 sats 94%. Endorses improvement in breathing. Patient afebrile.  - Tapering prednisone, continue mucinex for cough, nebs, levaquin, and closely follow  Insulin dependent Type 2 Diabetes Mellitus - Uncontrolled, hgb A1c 9.5. May be due to dietary indiscretion or inadequate insulin dosing.  - CBGs incredibly variable ranging from 119-518, this may be due to stress dose of steroids.  Adjustments made to insulin regimen.  - Tapering steroids, continue lantus, meal time insulin, and SSI, monitor CBGs  Acute on Chronic kidney disease, stage III baseline creatinine - Serum creatinine peaked at 2.74. Acute injury most likely related to acute diastolic heart failure exacerbation - Improving with diuresis, creatinine 1.62 on 03/22/15.  Hyperkalemia - Potassium 5.2 03/19/15. Given lasix, torsemide, and laxative. Resolved.   Hypothyroidism - Continue home dose synthroid  Overactive bladder - Continue home medication  Constipation - Last bowel movement 2 days ago.  - Continue senna-docusate and miralax. If no bowel movement in next day or  so consider enema.   Code Status: FULL Family Communication: no family at bedside Disposition Plan: remain inpatient DVT prophylaxis: Eliqius   Consultants:  Cardiology  Physical therapy  Procedures:  2d echo 03/17/15  Right heart catherization  Antibiotics:  Levaquin, started   HPI/Subjective: Patient has no overnight complaints or new issues. Endorses slight improvement in breathing. Still coughing up phlegm and is experiencing some nasal congestion. Denies fever, but does endorse intermittent chills. Had some nausea this morning, but has since resolved. Appetite remains normal. Last bowel movement 2 days ago, endorses blood on occasion in stool but attributes it to her hemorrhoids. Denies abdominal pain or vomiting.  Denies chest pain.   Objective: Filed Vitals:   03/23/15 1428  BP: 120/52  Pulse: 102  Temp: 98 F (36.7 C)  Resp: 18    Intake/Output Summary (Last 24 hours) at 03/23/15 1456 Last data filed at 03/23/15 1429  Gross per 24 hour  Intake    966 ml  Output   2450 ml  Net  -1484 ml   Filed Weights   03/21/15 0434 03/22/15 0346 03/23/15 0551  Weight: 83.19 kg (183 lb 6.4 oz) 81.058 kg (178 lb 11.2 oz) 79.425 kg (175 lb 1.6 oz)    Exam:   General:  [name] is a 81yo female, resting in bed, coughing up yellow phelgm, but in no acute distress.   Cardiovascular: Irregularly irregular. No murmurs, gallops, or rubs. 2+ radial pulses. Scant pitting edema in lower extremities b/l.  Respiratory: Crackles heard at lung bases bilaterally.   Abdomen: obese, soft, non-tender, non-distended, positive bowel sounds  Musculoskeletal: Moves all four   Data Reviewed: Basic Metabolic Panel:  Recent Labs Lab 03/20/15 [phone] 03/21/15 0449 03/21/15 2250 03/22/15 0418 03/23/15  0356  NA 127* 131* 131* 135 138  K 5.2* 4.1 4.0 3.6 3.5  CL 88* 88* 83* 89* 88*  CO2 24 34* 36* 37* 40*  GLUCOSE 409* 275* 539* 260* 160*  BUN 77* 72* 74* 75* 66*  CREATININE  2.43* 2.12* 2.02* 1.94* 1.62*  CALCIUM 8.6* 8.8* 8.8* 9.0 9.1   Liver Function Tests: No results for input(s): AST, ALT, ALKPHOS, BILITOT, PROT, ALBUMIN in the last 168 hours. No results for input(s): LIPASE, AMYLASE in the last 168 hours. No results for input(s): AMMONIA in the last 168 hours. CBC:  Recent Labs Lab 03/17/15 0645 03/18/15 0443 03/19/15 0345 03/20/15 0633 03/21/15 0449 03/22/15 0418  WBC 7.2 6.5 6.1 10.8* 8.7 6.5  NEUTROABS 5.3  --   --   --   --   --   HGB 11.6* 11.9* 11.4* 12.7 12.5 12.0  HCT 37.3 38.7 35.9* 37.8 39.1 37.9  MCV 97.4 96.8 95.0 92.9 95.8 95.0  PLT 142* 149* 145* 166 170 167   Cardiac Enzymes:  Recent Labs Lab 03/16/15 1944 03/17/15 0645 03/17/15 1640 03/17/15 1729  TROPONINI <0.03 <0.03 <0.03 <0.03   BNP (last 3 results)  Recent Labs  12/05/14 2250 03/16/15 1924  BNP 1143.9* 1109.9*    ProBNP (last 3 results) No results for input(s): PROBNP in the last 8760 hours.  CBG:  Recent Labs Lab 03/22/15 1634 03/22/15 1721 03/22/15 2312 03/23/15 0614 03/23/15 1131  GLUCAP 434* 448* 379* 119* 243*    No results found for this or any previous visit (from the past 240 hour(s)).   Studies: No results found.  Scheduled Meds: . albuterol  2.5 mg Nebulization TID  . allopurinol  100 mg Oral BID  . apixaban  5 mg Oral BID  . budesonide  0.25 mg Nebulization Daily  . diltiazem  120 mg Oral Daily  . fluticasone  1 spray Each Nare Daily  . furosemide  40 mg Oral BID  . guaiFENesin  600 mg Oral BID  . insulin aspart  0-15 Units Subcutaneous TID WC  . insulin aspart  0-5 Units Subcutaneous QHS  . insulin aspart  3 Units Subcutaneous TID WC  . insulin glargine  23 Units Subcutaneous BID  . isosorbide mononitrate  30 mg Oral Daily  . levofloxacin  250 mg Oral Daily  . levothyroxine  25 mcg Oral Once per day on Mon Wed Fri  . levothyroxine  50 mcg Oral Once per day on Sun Tue Thu Sat  . methocarbamol  500 mg Oral QHS  .  metoprolol  50 mg Oral BID  . oxybutynin  10 mg Oral Daily  . polyethylene glycol  17 g Oral Daily  . predniSONE  40 mg Oral Q breakfast  . senna-docusate  1 tablet Oral BID  . sodium chloride  3 mL Intravenous Q12H  . sodium chloride  3 mL Intravenous Q12H  . sodium chloride  3 mL Intravenous Q12H   Continuous Infusions:   Principal Problem:   Acute on chronic diastolic heart failure Active Problems:   Hypothyroidism   Obstructive sleep apnea   Atrial fibrillation   COPD (chronic obstructive pulmonary disease)   Secondary pulmonary hypertension   Overactive bladder   Type 2 diabetes mellitus with other circulatory complications   Time spent: 25 minutes  Frederica Kuster, PA-Student   [facility]  If 7PM-7AM, please contact night-coverage at [email or web address], password [facility] 03/23/2015, 2:56 PM  LOS: 7 days    Attending MD note  Patient was seen, examined,treatment plan was discussed with the PA-S.  I have personally reviewed the clinical findings, lab, imaging studies and management of this patient in detail. I agree with the documentation, as recorded by the PA-S  Slowly improving, continue Lasix, nebs, levaquin.  Follow.   [place] [facility]

## 2015-03-24 DIAGNOSIS — I4891 Unspecified atrial fibrillation: Secondary | ICD-10-CM

## 2015-03-24 LAB — BASIC METABOLIC PANEL
ANION GAP: 12 (ref 5–15)
BUN: 67 mg/dL — ABNORMAL HIGH (ref 6–20)
CHLORIDE: 87 mmol/L — AB (ref 101–111)
CO2: 38 mmol/L — AB (ref 22–32)
Calcium: 9 mg/dL (ref 8.9–10.3)
Creatinine, Ser: 1.67 mg/dL — ABNORMAL HIGH (ref 0.44–1.00)
GFR calc Af Amer: 33 mL/min — ABNORMAL LOW (ref >60)
GFR calc non Af Amer: 28 mL/min — ABNORMAL LOW (ref >60)
Glucose, Bld: 131 mg/dL — ABNORMAL HIGH (ref 65–99)
Potassium: 3.7 mmol/L (ref 3.5–5.1)
Sodium: 137 mmol/L (ref 135–145)

## 2015-03-24 LAB — GLUCOSE, CAPILLARY
GLUCOSE-CAPILLARY: 243 mg/dL — AB (ref 65–99)
GLUCOSE-CAPILLARY: 323 mg/dL — AB (ref 65–99)
Glucose-Capillary: 146 mg/dL — ABNORMAL HIGH (ref 65–99)
Glucose-Capillary: 229 mg/dL — ABNORMAL HIGH (ref 65–99)
Glucose-Capillary: 331 mg/dL — ABNORMAL HIGH (ref 65–99)

## 2015-03-24 MED ORDER — IPRATROPIUM-ALBUTEROL 0.5-2.5 (3) MG/3ML IN SOLN: 3.0000 mL | Freq: Four times a day (QID) | RESPIRATORY_TRACT | Status: DC

## 2015-03-24 MED ORDER — GUAIFENESIN ER 600 MG PO TB12
1200.0000 mg | ORAL_TABLET | Freq: Two times a day (BID) | ORAL | Status: DC
  Administered 2015-03-24 – 2015-03-25 (×2): 1200 mg via ORAL
  Filled 2015-03-24 (×3): qty 2

## 2015-03-24 MED ORDER — IPRATROPIUM-ALBUTEROL 0.5-2.5 (3) MG/3ML IN SOLN
3.0000 mL | Freq: Four times a day (QID) | RESPIRATORY_TRACT | Status: DC
  Administered 2015-03-24 – 2015-03-25 (×4): 3 mL via RESPIRATORY_TRACT
  Filled 2015-03-24 (×4): qty 3

## 2015-03-24 MED ORDER — GUAIFENESIN 100 MG/5ML PO SYRP
200.0000 mg | ORAL_SOLUTION | ORAL | Status: DC | PRN
  Administered 2015-03-24 – 2015-03-25 (×2): 200 mg via ORAL
  Filled 2015-03-24 (×3): qty 10

## 2015-03-24 MED ORDER — PREDNISONE 20 MG PO TABS
30.0000 mg | ORAL_TABLET | Freq: Every day | ORAL | Status: DC
  Administered 2015-03-25: 30 mg via ORAL
  Filled 2015-03-24 (×2): qty 1

## 2015-03-24 NOTE — Care Management Important Message (Signed)
Important Message  Patient Details  Name: [name] MRN: [id number] Date of Birth: 07/30/1934   Medicare Important Message Given:  Yes-third notification given    [name] 03/24/2015, 2:03 PM

## 2015-03-24 NOTE — Progress Notes (Signed)
Physical Therapy Treatment Patient Details Name: [name] MRN: [id number] DOB: [birth year] Today's Date: 03/24/2015    History of Present Illness 81 yo admitted with fall, SOB and CHF exacerbation. PMHx of COPD on home O2    PT Comments    Pt ambulated on 2 L/min with o2 sat 89-91%.  On 3 L/min it increased to 92-93% with gait.  Pt continues to have decreased activity tolerance with 200' being max she has been able to ambulate with 1 standing rest break. Encouraged pt to ambulate with nursing as well.  Follow Up Recommendations  No PT follow up     Equipment Recommendations  None recommended by PT    Recommendations for Other Services       Precautions / Restrictions Precautions Precautions: Fall Precaution Comments: o2 dependent Restrictions Weight Bearing Restrictions: No    Mobility  Bed Mobility Overal bed mobility: Modified Independent                Transfers Overall transfer level: Modified independent Equipment used: Rolling walker (2 wheeled)             General transfer comment: Stood from recliner and BSC with MOD I  Ambulation/Gait Ambulation/Gait assistance: Supervision Ambulation Distance (Feet): 200 Feet Assistive device: Rolling walker (2 wheeled) Gait Pattern/deviations: Step-through pattern Gait velocity: WNL   General Gait Details: 1 standing rest break.  o2 on 2L/min with 1st half of gait and 89-91%.  Increased to 3 L/min and 92-93% second half of gait.  Educated on pursed lip breathing.   Stairs            Wheelchair Mobility    Modified Rankin (Stroke Patients Only)       Balance Overall balance assessment: Needs assistance Sitting-balance support: Feet supported;No upper extremity supported Sitting balance-Leahy Scale: Good       Standing balance-Leahy Scale: Fair Standing balance comment: Able to transfer withut RW                    Cognition Arousal/Alertness: Awake/alert Behavior  During Therapy: WFL for tasks assessed/performed Overall Cognitive Status: Within Functional Limits for tasks assessed                      Exercises      General Comments General comments (skin integrity, edema, etc.): offered stair training and pt declined stating it is 1 small step then another to her stair lift.      Pertinent Vitals/Pain Pain Assessment: No/denies pain 89-91% on 2L/min with gait 92-93% on 3 L/min with gait    Home Living                      Prior Function            PT Goals (current goals can now be found in the care plan section) Acute Rehab PT Goals Patient Stated Goal: return home PT Goal Formulation: With patient Time For Goal Achievement: 03/31/15 Potential to Achieve Goals: Good Progress towards PT goals: Not progressing toward goals - comment (Unable to increase ambulation distance.)    Frequency  Min 3X/week    PT Plan Current plan remains appropriate    Co-evaluation             End of Session Equipment Utilized During Treatment: Gait belt;Oxygen Activity Tolerance: Patient limited by fatigue;Patient tolerated treatment well Patient left: in bed;with call bell/phone within reach     Time: 1016-1030  PT Time Calculation (min) (ACUTE ONLY): 14 min  Charges:                       G Codes:      Latrica Clowers LUBECK 03/24/2015, 11:13 AM

## 2015-03-24 NOTE — Consult Note (Signed)
   [facility] CM Inpatient Consult   03/24/2015  Baylen Dea [facility] 07-05-1934 [id number] Referral received to assess for care management services for post hospital follow up for heart failure. Explained that [place] [facility] is a covered benefit of Medicare insurance.   Review information for [facility] Care Management and a folder was provided with contact information.  Explained that [place] [facility] does not interfere with or replace any services arranged by the inpatient care management staff.  Patient declined services with [place] [facility] stating, I don't need any nurses, I am doing just fine. I use to have them coming out but I am better.  Brochure with contact information given and encouraged the patient call if her needs changes.  For questions, please contact: Natividad Brood, RN BSN [facility] Liaison  [phone] business mobile phone

## 2015-03-24 NOTE — Progress Notes (Signed)
At 1515 introduced self to pt as in coming nurse till 7pm.  Call bell at reach and asked to call as needed.  Verbalized understanding.

## 2015-03-24 NOTE — Progress Notes (Signed)
[facility] PROGRESS NOTE  [name] NOI:[id number] DOB: September 30, 1933 DOA: 03/16/2015 PCP: Cari Caraway, MD  Assessment/Plan: Acute on chronic diastolic heart failure with Right sided heart failure: - Patient presented with worsening shortness of breath, orthopnea, and PND. BNP 1109. CXR showing pulmonary congestion. Underwent 2d echo and right heart catheterization. Cardiology following.  - Improving. Since admission, net output -11L, weight down to 175 lbs (181 lb on admit). Patient endorses slight improvement in breathing; however, still coughing yellow phlegm. - Continue diuresing and follow   Atrial fibrillation CHADSVASC 7 - Currently anticoagulated with Eliquis.  - Rate controlled with metoprolol and cardizem, managing with cardiology  Chronic hypoxia with COPD and OSA - Patient 2L O2 at home. Initially on 3L [place], however titrated down to 2L and remains stable with O2 sats 94%. Endorses improvement in breathing. However main complaint now is cough-needs to be mobilized and started on incentive spirometry and flutter valve. Continue supportive care with nebs/antitussives. Repeat chest x-ray in a.m.   Insulin dependent Type 2 Diabetes Mellitus - Uncontrolled, hgb A1c 9.5. May be due to dietary indiscretion or inadequate insulin dosing.  - CBGs incredibly variable ranging from 119-518, this may be due to stress dose of steroids. Lantus increased to 23 units bid, should not need further increase in basal. Prednisone affects primarily meal coverage needs, increase in meal coverage to 5 units tidwc as recommended per diabetes coordinator. - Tapering steroids, SSI, monitor CBGs  Acute on Chronic kidney disease, stage III baseline creatinine  - Serum creatinine peaked at 2.74. Acute injury most likely related to acute diastolic heart failure exacerbation - Although improving with diuresis, creatinine with slight increase of 1.67 from 1.62 yesterday 8/10.  Hyperkalemia -  Potassium 5.2 03/19/15. Given lasix, torsemide, and laxative. Resolved.   Hypothyroidism - Continue home dose synthroid  Overactive bladder - Continue home medication  Constipation - Last bowel movement 2 days ago.  - Continue senna-docusate and miralax. If no bowel movement in next day or so consider enema.   Code Status: FULL Family Communication: no family at bedside Disposition Plan: remain inpatient DVT prophylaxis: Eliqius   Consultants:  Cardiology  Physical therapy  Procedures:  2d echo 03/17/15  Right heart catherization  Antibiotics:  Levaquin 8/9>>  HPI/Subjective: Coughing and chest congestion, reporting of not being able to "get it out of my chest." No fever. Denies chest pain, nausea, vomiting or abdominal pain.   Objective: Filed Vitals:   03/24/15 1039  BP: 129/80  Pulse:   Temp:   Resp:     Intake/Output Summary (Last 24 hours) at 03/24/15 1321 Last data filed at 03/24/15 0930  Gross per 24 hour  Intake    960 ml  Output   1700 ml  Net   -740 ml   Filed Weights   03/22/15 0346 03/23/15 0551 03/24/15 0437  Weight: 81.058 kg (178 lb 11.2 oz) 79.425 kg (175 lb 1.6 oz) 79.243 kg (174 lb 11.2 oz)    Exam:   General:  [name] is a 81yo female, sitting in chair, in no acute distress.   Cardiovascular: Irregularly irregular. No murmurs, gallops, or rubs. 2+ radial pulses.   Respiratory: Crackles heard at lung bases bilaterally.   Abdomen: obese, soft, non-tender, non-distended, positive bowel sounds  Musculoskeletal: Moves all four   Data Reviewed: Basic Metabolic Panel:  Recent Labs Lab 03/21/15 0449 03/21/15 2250 03/22/15 0418 03/23/15 0356 03/24/15 0503  NA 131* 131* 135 138 137  K 4.1 4.0 3.6 3.5  3.7  CL 88* 83* 89* 88* 87*  CO2 34* 36* 37* 40* 38*  GLUCOSE 275* 539* 260* 160* 131*  BUN 72* 74* 75* 66* 67*  CREATININE 2.12* 2.02* 1.94* 1.62* 1.67*  CALCIUM 8.8* 8.8* 9.0 9.1 9.0   CBC:  Recent Labs Lab  03/18/15 0443 03/19/15 0345 03/20/15 0633 03/21/15 0449 03/22/15 0418  WBC 6.5 6.1 10.8* 8.7 6.5  HGB 11.9* 11.4* 12.7 12.5 12.0  HCT 38.7 35.9* 37.8 39.1 37.9  MCV 96.8 95.0 92.9 95.8 95.0  PLT 149* 145* 166 170 167   Cardiac Enzymes:  Recent Labs Lab 03/17/15 1640 03/17/15 1729  TROPONINI <0.03 <0.03   BNP (last 3 results)  Recent Labs  12/05/14 2250 03/16/15 1924  BNP 1143.9* 1109.9*   CBG:  Recent Labs Lab 03/23/15 1131 03/23/15 1631 03/23/15 2125 03/24/15 0702 03/24/15 1110  GLUCAP 243* 261* 342* 146* 229*    Studies: No results found.  Scheduled Meds: . albuterol  2.5 mg Nebulization TID  . allopurinol  100 mg Oral BID  . apixaban  5 mg Oral BID  . budesonide  0.25 mg Nebulization BID  . diltiazem  120 mg Oral Daily  . fluticasone  1 spray Each Nare Daily  . furosemide  40 mg Oral BID  . guaiFENesin  600 mg Oral BID  . insulin aspart  0-15 Units Subcutaneous TID WC  . insulin aspart  0-5 Units Subcutaneous QHS  . insulin aspart  3 Units Subcutaneous TID WC  . insulin glargine  23 Units Subcutaneous BID  . isosorbide mononitrate  30 mg Oral Daily  . levofloxacin  250 mg Oral Daily  . levothyroxine  25 mcg Oral Once per day on Mon Wed Fri  . levothyroxine  50 mcg Oral Once per day on Sun Tue Thu Sat  . methocarbamol  500 mg Oral QHS  . metoprolol  50 mg Oral BID  . oxybutynin  10 mg Oral Daily  . polyethylene glycol  17 g Oral Daily  . predniSONE  40 mg Oral Q breakfast  . senna-docusate  1 tablet Oral BID  . sodium chloride  3 mL Intravenous Q12H  . sodium chloride  3 mL Intravenous Q12H  . sodium chloride  3 mL Intravenous Q12H   Continuous Infusions:   Principal Problem:   Acute on chronic diastolic heart failure Active Problems:   Hypothyroidism   Obstructive sleep apnea   Atrial fibrillation   COPD (chronic obstructive pulmonary disease)   Secondary pulmonary hypertension   Overactive bladder   Type 2 diabetes mellitus with  other circulatory complications   Time spent: 25 minutes  Brandi L. Guerry Bruin, PA-S  [facility]  If 7PM-7AM, please contact night-coverage at [email or web address], password [facility] 03/24/2015, 1:21 PM  LOS: 8 days     Attending MD note  Patient was seen, examined,treatment plan was discussed with the PA-S.  I have personally reviewed the clinical findings, lab, imaging studies and management of this patient in detail. I agree with the documentation, as recorded by the PA-S.   81 year old female admitted with worsening shortness of breath-found to have both right and left-sided heart failure. Diuresed well, hospital course has been complicated by development of acute renal failure which is now significantly improved. In terms of full volume status-she has significantly improved-however she continues to have cough and pulmonary issues-she needs to be mobilized, started on incentive spirometry flutter valve. Suspect home in the next 1-2 days.   [place] [facility]

## 2015-03-24 NOTE — Progress Notes (Signed)
Inpatient Diabetes Program Recommendations  AACE/ADA: New Consensus Statement on Inpatient Glycemic Control (2013)  Target Ranges:  Prepandial:   less than 140 mg/dL      Peak postprandial:   less than 180 mg/dL (1-2 hours)      Critically ill patients:  140 - 180 mg/dL   Post-prandial glucose levels elevated Inpatient Diabetes Program Recommendations Insulin - Basal: Noted lantus increased to 23 units bid.  Fasting glucose levels are good. Should not need further increase in basal Insulin - Meal Coverage: Prednisone affects primarily the meal coverage needs. Please cnsider increase in meal coverage to 5 units tidwc (for any dose prednisone greater than 10 mg/day. may need meal coverage)  Thank you Rosita Kea, RN, MSN, CDE  Diabetes Inpatient Program Office: [phone] Pager: [phone] 8:00 am to 5:00 pm

## 2015-03-25 ENCOUNTER — Inpatient Hospital Stay (HOSPITAL_COMMUNITY): Payer: Medicare Other

## 2015-03-25 ENCOUNTER — Other Ambulatory Visit: Payer: Self-pay | Admitting: Physician Assistant

## 2015-03-25 DIAGNOSIS — E039 Hypothyroidism, unspecified: Secondary | ICD-10-CM

## 2015-03-25 DIAGNOSIS — N189 Chronic kidney disease, unspecified: Secondary | ICD-10-CM

## 2015-03-25 LAB — GLUCOSE, CAPILLARY
Glucose-Capillary: 229 mg/dL — ABNORMAL HIGH (ref 65–99)
Glucose-Capillary: 189 mg/dL — ABNORMAL HIGH (ref 65–99)
Glucose-Capillary: 221 mg/dL — ABNORMAL HIGH (ref 65–99)

## 2015-03-25 LAB — BASIC METABOLIC PANEL
ANION GAP: 8 (ref 5–15)
BUN: 72 mg/dL — ABNORMAL HIGH (ref 6–20)
CALCIUM: 8.7 mg/dL — AB (ref 8.9–10.3)
CO2: 43 mmol/L — ABNORMAL HIGH (ref 22–32)
CREATININE: 1.97 mg/dL — AB (ref 0.44–1.00)
Chloride: 85 mmol/L — ABNORMAL LOW (ref 101–111)
GFR, EST AFRICAN AMERICAN: 27 mL/min — AB (ref >60)
GFR, EST NON AFRICAN AMERICAN: 23 mL/min — AB (ref >60)
Glucose, Bld: 226 mg/dL — ABNORMAL HIGH (ref 65–99)
Potassium: 4.2 mmol/L (ref 3.5–5.1)
Sodium: 136 mmol/L (ref 135–145)

## 2015-03-25 MED ORDER — FUROSEMIDE 40 MG PO TABS: 60.0000 mg | ORAL_TABLET | Freq: Every day | ORAL | Status: DC

## 2015-03-25 MED ORDER — DILTIAZEM HCL ER COATED BEADS 120 MG PO CP24: 120.0000 mg | ORAL_CAPSULE | Freq: Every day | ORAL | Status: DC

## 2015-03-25 MED ORDER — LEVOFLOXACIN 250 MG PO TABS: 250.0000 mg | ORAL_TABLET | Freq: Every day | ORAL | Status: DC

## 2015-03-25 MED ORDER — POLYETHYLENE GLYCOL 3350 17 G PO PACK: 17.0000 g | PACK | Freq: Every day | ORAL | Status: DC

## 2015-03-25 MED ORDER — ALBUTEROL SULFATE (2.5 MG/3ML) 0.083% IN NEBU: 2.5000 mg | INHALATION_SOLUTION | RESPIRATORY_TRACT | Status: AC | PRN

## 2015-03-25 MED ORDER — PREDNISONE 10 MG PO TABS: ORAL_TABLET | ORAL | Status: DC

## 2015-03-25 MED ORDER — SENNOSIDES-DOCUSATE SODIUM 8.6-50 MG PO TABS: 1.0000 | ORAL_TABLET | Freq: Every evening | ORAL | Status: DC | PRN

## 2015-03-25 MED ORDER — DILTIAZEM HCL ER COATED BEADS 120 MG PO CP24
120.0000 mg | ORAL_CAPSULE | Freq: Every day | ORAL | Status: DC
Start: 2015-03-25 — End: 2015-03-25

## 2015-03-25 MED ORDER — GUAIFENESIN ER 600 MG PO TB12: 1200.0000 mg | ORAL_TABLET | Freq: Two times a day (BID) | ORAL | Status: DC

## 2015-03-25 NOTE — Discharge Summary (Signed)
PATIENT DETAILS Name: [name] Age: 81 y.o. Sex: female Date of Birth: Oct 13, 1933 MRN: [id number]. Admitting Physician: Lavina Hamman, MD NKN:[id number],LPFXT, MD  Admit Date: 03/16/2015 Discharge date: 03/25/2015  Recommendations for Outpatient Follow-up:  1. Please ensure follow-up with pulmonology/cardiology.  2. Please continue to optimize/adjust diuretics-blood work (electrolytes) scheduled for next week   PRIMARY DISCHARGE DIAGNOSIS:  Principal Problem:   Acute on chronic diastolic heart failure Active Problems:   Hypothyroidism   Obstructive sleep apnea   Atrial fibrillation   COPD (chronic obstructive pulmonary disease)   Secondary pulmonary hypertension   Overactive bladder   Type 2 diabetes mellitus with other circulatory complications      PAST MEDICAL HISTORY: Past Medical History  Diagnosis Date  . CAD (coronary artery disease)   . CHF (congestive heart failure)   . Paroxysmal atrial fibrillation   . Bradycardia   . Hypertension   . Renal artery stenosis   . COPD (chronic obstructive pulmonary disease)   . Obesity hypoventilation syndrome   . Pruritus   . Tracheobronchitis   . Acute rhinosinusitis   . Anemia   . Hypoxemia   . Peripheral edema   . Obesity, endogenous   . Diabetes mellitus   . Acid reflux disease   . Osteoarthritis, generalized   . Hypothyroidism   . Lung nodule     lingula  . OSA (obstructive sleep apnea)     NPSG 04/03/00--AHI 28.hr  . DVT (deep venous thrombosis) 2003    left leg  . Vaginal atrophy   . Hx of colonic polyps   . Acute diastolic heart failure   . Secondary pulmonary hypertension 06/09/2013  . Blood transfusion without reported diagnosis 2009    had internal bleeding  . Cancer 1959    ovarian cancer  . Osteoporosis     DISCHARGE MEDICATIONS: Current Discharge Medication List    START taking these medications   Details  albuterol (PROVENTIL) (2.5 MG/3ML) 0.083% nebulizer solution Take 3 mLs  (2.5 mg total) by nebulization every 2 (two) hours as needed for wheezing or shortness of breath. Qty: 75 mL, Refills: 3    guaiFENesin (MUCINEX) 600 MG 12 hr tablet Take 2 tablets (1,200 mg total) by mouth 2 (two) times daily. Qty: 12 tablet, Refills: 0    levofloxacin (LEVAQUIN) 250 MG tablet Take 1 tablet (250 mg total) by mouth daily. Qty: 2 tablet, Refills: 0    polyethylene glycol (MIRALAX / GLYCOLAX) packet Take 17 g by mouth daily. Qty: 14 each, Refills: 0    predniSONE (DELTASONE) 10 MG tablet Take 3 tablets (30 mg) daily for 2 days, then, Take 2 tablets (20 mg) daily for 2 days, then, Take 1 tablets (10 mg) daily for 1 days, then stop Qty: 11 tablet, Refills: 0    senna-docusate (SENOKOT-S) 8.6-50 MG per tablet Take 1 tablet by mouth at bedtime as needed for mild constipation. Qty: 20 tablet, Refills: 0      CONTINUE these medications which have CHANGED   Details  diltiazem (CARDIZEM CD) 120 MG 24 hr capsule Take 1 capsule (120 mg total) by mouth daily. Qty: 90 capsule, Refills: 3    furosemide (LASIX) 40 MG tablet Take 1.5 tablets (60 mg total) by mouth daily. Contact cardiology office or PCP if you experienced > 3 pounds overnight or > 5 pounds in 1 week for further instructions Qty: 90 tablet, Refills: 3      CONTINUE these medications which have NOT CHANGED  Details  allopurinol (ZYLOPRIM) 100 MG tablet Take 1 tablet (100 mg total) by mouth daily.    apixaban (ELIQUIS) 5 MG TABS tablet Take 1 tablet (5 mg total) by mouth 2 (two) times daily. Qty: 180 tablet, Refills: 0    budesonide (PULMICORT) 0.25 MG/2ML nebulizer solution Take 2 mLs (0.25 mg total) by nebulization daily. Qty: 60 mL, Refills: prn    colchicine 0.6 MG tablet Take 1.2 mg by mouth daily as needed (for gout flareups).     insulin glargine (LANTUS) 100 UNIT/ML injection Inject 0.2 mLs (20 Units total) into the skin daily. Qty: 10 mL, Refills: 12    insulin lispro (HUMALOG) 100 UNIT/ML  injection Inject 2 Units into the skin 2 (two) times daily with breakfast and lunch. Per sliding scale. Patient unsure about how much she really takes    isosorbide mononitrate (IMDUR) 30 MG 24 hr tablet 1 tablet by mouth daily Qty: 90 tablet, Refills: 0    !! levothyroxine (SYNTHROID, LEVOTHROID) 25 MCG tablet Take 25 mcg by mouth See admin instructions. Take 1 tablet (25 mcg) on Monday, Wednesday, Friday    !! levothyroxine (SYNTHROID, LEVOTHROID) 50 MCG tablet Take 50 mcg by mouth See admin instructions. Take 50mg  on Tues, Thurs, Sat and sundays    methocarbamol (ROBAXIN) 500 MG tablet Take 500 mg by mouth at bedtime.    metoprolol (LOPRESSOR) 50 MG tablet 1 tablet by mouth twice a day Qty: 180 tablet, Refills: 0    nitroGLYCERIN (NITROSTAT) 0.4 MG SL tablet Place 0.4 mg under the tongue every 5 (five) minutes as needed for chest pain.     oxybutynin (DITROPAN XL) 10 MG 24 hr tablet Take 10 mg by mouth daily.     traMADol (ULTRAM) 50 MG tablet Take 50 mg by mouth every 6 (six) hours as needed for moderate pain.    Associated Diagnoses: Bronchitis, not specified as acute or chronic    albuterol (PROAIR HFA) 108 (90 BASE) MCG/ACT inhaler 1-2 puffs QID prn Qty: 1 Inhaler, Refills: 6    azelastine (ASTELIN) 137 MCG/SPRAY nasal spray 1-2 sprays each nostril once or twice daily Qty: 90 mL, Refills: 3     !! - Potential duplicate medications found. Please discuss with provider.    STOP taking these medications     amoxicillin-clavulanate (AUGMENTIN) 875-125 MG per tablet         ALLERGIES:   Allergies  Allergen Reactions  . Cefuroxime Axetil Shortness Of Breath, Swelling and Rash    Throat swelling  . Cephalexin Shortness Of Breath, Swelling and Rash    Throat swelling  . Clarithromycin Shortness Of Breath, Swelling and Rash    Throat swelling  . Doxycycline Shortness Of Breath, Swelling and Rash     rash on legs and feet, throat swelling  . Erythromycin Shortness Of  Breath, Swelling and Rash    Throat swelling  . Tetracycline Shortness Of Breath, Swelling and Rash    Throat swelling    BRIEF HPI:  See H&P, Labs, Consult and Test reports for all details in brief, patient is a 81 year old female with history of chronic diastolic heart failure, atrial fibrillation on anticoagulation, COPD, chronic hypoxic respiratory failure on 2 L of oxygen admitted for evaluation and treatment of acute decompensated CHF.  CONSULTATIONS:   cardiology  PERTINENT RADIOLOGIC STUDIES: Dg Chest 2 View  03/25/2015   CLINICAL DATA:  Shortness of breath, cough, history of COPD, CHF, pulmonary hypertension.  EXAM: CHEST  2 VIEW  COMPARISON:  PA and lateral chest x-ray of March 18, 2015  FINDINGS: The appearance of the pulmonary interstitium has improved which likely reflects some resolution of interstitial edema. The cardiac silhouette remains enlarged. The pulmonary vascularity is less prominent centrally. There is no pleural effusion. There is wedge compression of the body of T12 which is stable and there is mild loss of height of approximately T3 and T5. There is a prosthetic right shoulder joint.  IMPRESSION: Slight interval improvement in the appearance of the chest. There are chronic bronchitic changes with decreasing pulmonary interstitial edema.   Electronically Signed   By: David  Martinique M.D.   On: 03/25/2015 07:56   Dg Chest 2 View  03/18/2015   CLINICAL DATA:  Cough and nausea for 2 months, dyspnea, coronary artery disease, CHF, atrial fibrillation, COPD, diabetes mellitus, coronary artery disease, former smoker  EXAM: CHEST  2 VIEW  COMPARISON:  03/16/2015  FINDINGS: Enlargement of cardiac silhouette with vascular congestion.  Atherosclerotic calcification aorta.  Peribronchial thickening with chronic accentuation of interstitial markings little changed.  Mitral annular calcification noted.  Bibasilar atelectasis.  No pulmonary infiltrate, pleural effusion or pneumothorax.   Osseous demineralization with compression deformity of a vertebral body at the thoracolumbar junction question T12 unchanged.  RIGHT shoulder prosthesis with LEFT glenohumeral degenerative changes noted.  Surgical clips RIGHT upper quadrant likely prior cholecystectomy.  IMPRESSION: Chronic bronchitic and interstitial changes with bibasilar atelectasis.  Enlargement of cardiac silhouette with pulmonary vascular congestion.   Electronically Signed   By: Lavonia Dana M.D.   On: 03/18/2015 10:14   Dg Chest 2 View  03/16/2015   CLINICAL DATA:  Fall today. Left sided chest pain and shortness of breath. Initial encounter. COPD and congestive heart failure.  EXAM: CHEST  2 VIEW  COMPARISON:  Seven hundred fourteen  FINDINGS: Mild to moderate cardiomegaly remains stable. Both lungs are clear. No evidence of pulmonary infiltrate or edema. No evidence of pneumothorax or pleural effusion. Right shoulder prosthesis again noted.  IMPRESSION: Stable cardiomegaly.  No active lung disease.   Electronically Signed   By: Earle Gell M.D.   On: 03/16/2015 20:45   Ct Head Wo Contrast  03/16/2015   CLINICAL DATA:  Tripped on carpet. Hit head on site of table. Headache, scalp hematoma, and neck pain.  EXAM: CT HEAD WITHOUT CONTRAST  CT CERVICAL SPINE WITHOUT CONTRAST  TECHNIQUE: Multidetector CT imaging of the head and cervical spine was performed following the standard protocol without intravenous contrast. Multiplanar CT image reconstructions of the cervical spine were also generated.  COMPARISON:  Head CT on 12/05/2014 and cervical spine CT on 12/31/2009  FINDINGS: CT HEAD FINDINGS  There is no evidence of intracranial hemorrhage, brain edema, or other signs of acute infarction. There is no evidence of intracranial mass lesion or mass effect. No abnormal extraaxial fluid collections are identified.  Mild to moderate chronic small vessel disease noted. No evidence of hydrocephalus. No evidence of skull fracture or pneumocephalus.   CT CERVICAL SPINE FINDINGS  No evidence of acute fracture, subluxation, or prevertebral soft tissue swelling.  Cervical degenerative disc disease is seen at most levels which is most pronounced at C4-5, C5-6, and C6-7. Mild to moderate facet DJD is also seen bilaterally at these levels. Atlantoaxial degenerative changes also noted. Generalized osteopenia demonstrated.  IMPRESSION: No acute intracranial abnormality. Mild to moderate chronic small vessel disease.  No evidence of acute cervical spine fracture or subluxation. Degenerative spondylosis, as described above. Osteopenia.   Electronically Signed  By: Earle Gell M.D.   On: 03/16/2015 21:28   Ct Cervical Spine Wo Contrast  03/16/2015   CLINICAL DATA:  Tripped on carpet. Hit head on site of table. Headache, scalp hematoma, and neck pain.  EXAM: CT HEAD WITHOUT CONTRAST  CT CERVICAL SPINE WITHOUT CONTRAST  TECHNIQUE: Multidetector CT imaging of the head and cervical spine was performed following the standard protocol without intravenous contrast. Multiplanar CT image reconstructions of the cervical spine were also generated.  COMPARISON:  Head CT on 12/05/2014 and cervical spine CT on 12/31/2009  FINDINGS: CT HEAD FINDINGS  There is no evidence of intracranial hemorrhage, brain edema, or other signs of acute infarction. There is no evidence of intracranial mass lesion or mass effect. No abnormal extraaxial fluid collections are identified.  Mild to moderate chronic small vessel disease noted. No evidence of hydrocephalus. No evidence of skull fracture or pneumocephalus.  CT CERVICAL SPINE FINDINGS  No evidence of acute fracture, subluxation, or prevertebral soft tissue swelling.  Cervical degenerative disc disease is seen at most levels which is most pronounced at C4-5, C5-6, and C6-7. Mild to moderate facet DJD is also seen bilaterally at these levels. Atlantoaxial degenerative changes also noted. Generalized osteopenia demonstrated.  IMPRESSION: No acute  intracranial abnormality. Mild to moderate chronic small vessel disease.  No evidence of acute cervical spine fracture or subluxation. Degenerative spondylosis, as described above. Osteopenia.   Electronically Signed   By: Earle Gell M.D.   On: 03/16/2015 21:28     PERTINENT LAB RESULTS: CBC: No results for input(s): WBC, HGB, HCT, PLT in the last 72 hours. CMET CMP     Component Value Date/Time   NA 136 03/25/2015 0316   K 4.2 03/25/2015 0316   CL 85* 03/25/2015 0316   CO2 43* 03/25/2015 0316   GLUCOSE 226* 03/25/2015 0316   BUN 72* 03/25/2015 0316   CREATININE 1.97* 03/25/2015 0316   CALCIUM 8.7* 03/25/2015 0316   PROT 7.6 12/04/2014 1515   ALBUMIN 3.9 12/04/2014 1515   AST 15 12/04/2014 1515   ALT 25 12/04/2014 1515   ALKPHOS 77 12/04/2014 1515   BILITOT 0.7 12/04/2014 1515   GFRNONAA 23* 03/25/2015 0316   GFRAA 27* 03/25/2015 0316    GFR Estimated Creatinine Clearance: 22.5 mL/min (by C-G formula based on Cr of 1.97). No results for input(s): LIPASE, AMYLASE in the last 72 hours. No results for input(s): CKTOTAL, CKMB, CKMBINDEX, TROPONINI in the last 72 hours. Invalid input(s): POCBNP No results for input(s): DDIMER in the last 72 hours. No results for input(s): HGBA1C in the last 72 hours. No results for input(s): CHOL, HDL, LDLCALC, TRIG, CHOLHDL, LDLDIRECT in the last 72 hours. No results for input(s): TSH, T4TOTAL, T3FREE, THYROIDAB in the last 72 hours.  Invalid input(s): FREET3 No results for input(s): VITAMINB12, FOLATE, FERRITIN, TIBC, IRON, RETICCTPCT in the last 72 hours. Coags: No results for input(s): INR in the last 72 hours.  Invalid input(s): PT Microbiology: No results found for this or any previous visit (from the past 240 hour(s)).   BRIEF HOSPITAL COURSE:  Acute on chronic diastolic heart failure with Right sided heart failure: - Patient presented with worsening shortness of breath, orthopnea, and PND. BNP 1109. CXR showing pulmonary  congestion. Underwent 2d echo and right heart catheterization. Much improved with diuretics-by day of discharge -13 L balance and weight down to 175 pounds (181 pounds on admission). Found to be slightly dry on discharge-recommendations are to decrease Lasix to 60 mg, cardiology  will arrange for early outpatient follow-up next week.  Atrial fibrillation CHADSVASC 7 - Currently anticoagulated with Eliquis.  - Rate controlled with metoprolol and cardizem  Chronic hypoxia with COPD and OSA - Patient 2L O2 at home. Initially on 3L [place], however titrated down to 2L and remains stable with O2 sats 94%. Much improved with diuretics and nebulized bronchodilators. Also provided supportive care with incentive spirometry/flutter weren't and chest physiotherapy. By day of discharge much improved and very close to her usual baseline. Have asked patient to follow-up with her primary pulmonologist.   Insulin dependent Type 2 Diabetes Mellitus: CBGs much better by the day of discharge, will resume usual dosing of insulin on discharge.  Acute on Chronic kidney disease, stage III baseline creatinine  -Creatinine improves then her earlier hospital stay, but slightly bumped at discharge-plans are to decrease Lasix to 60 mg daily to have quick outpatient follow-up with cardiology for BMET-and further adjustment of diabetic regimen  Hyperkalemia  Resolved.   Hypothyroidism - Continue home dose synthroid  Overactive bladder - Continue home medication  TODAY-DAY OF DISCHARGE:  Subjective:   Carleah Paff today has no headache,no chest abdominal pain,no new weakness tingling or numbness, feels much better wants to go home today.   Objective:   Blood pressure 114/55, pulse 61, temperature 98.2 F (36.8 C), temperature source Oral, resp. rate 16, height 5\' 1"  (1.549 m), weight 79.516 kg (175 lb 4.8 oz), last menstrual period 08/13/1957, SpO2 93 %.  Intake/Output Summary (Last 24 hours) at 03/25/15  1526 Last data filed at 03/25/15 1402  Gross per 24 hour  Intake   1280 ml  Output   2550 ml  Net  -1270 ml   Filed Weights   03/23/15 0551 03/24/15 0437 03/25/15 0400  Weight: 79.425 kg (175 lb 1.6 oz) 79.243 kg (174 lb 11.2 oz) 79.516 kg (175 lb 4.8 oz)    Exam Awake Alert, Oriented *3, No new F.N deficits, Normal affect [place].AT,PERRAL Supple Neck,No JVD, No cervical lymphadenopathy appriciated.  Symmetrical Chest wall movement, Good air movement bilaterally, CTAB RRR,No Gallops,Rubs or new Murmurs, No Parasternal Heave +ve B.Sounds, Abd Soft, Non tender, No organomegaly appriciated, No rebound -guarding or rigidity. No Cyanosis, Clubbing or edema, No new Rash or bruise  DISCHARGE CONDITION: Stable  DISPOSITION: Home with home health services  DISCHARGE INSTRUCTIONS:    Activity:  As tolerated with Full fall precautions use walker/cane & assistance as needed  Diet recommendation: Diabetic Diet Heart Healthy diet  Discharge Instructions    Diet - low sodium heart healthy    Complete by:  As directed      Diet Carb Modified    Complete by:  As directed      Increase activity slowly    Complete by:  As directed            Follow-up Information    Follow up with [facility], MD On 03/31/2015.   Specialty:  Family Medicine   Why:  @ 1:30 PM for hospital follow-up.  Confirmed appointment with [facility] information:   [place] [address] [phone]       Follow up with Jettie Booze., MD On 04/01/2015.   Specialties:  Cardiology, Radiology, Interventional Cardiology   Why:  3:45pm   Contact information:   [address] [address] [phone]       Follow up with [facility] On 03/28/2015.   Specialty:  Cardiology  Why:  Obtain BMET lab during normal lab hours between 8:30am until 4:30pm. To check renal function.   Contact information:   [address] [place] [phone]      Follow up with [facility].   Why:  They will do your home health care services at your home   Contact information:   [place] [address] [phone]       Total Time spent on discharge equals 45 minutes.  SignedOren Binet 03/25/2015 3:26 PM

## 2015-03-25 NOTE — Progress Notes (Signed)
Patient Name: [name] Date of Encounter: 03/25/2015  Primary Cardiologist: Dr Irish Lack   Principal Problem:   Acute on chronic diastolic heart failure Active Problems:   Hypothyroidism   Obstructive sleep apnea   Atrial fibrillation   COPD (chronic obstructive pulmonary disease)   Secondary pulmonary hypertension   Overactive bladder   Type 2 diabetes mellitus with other circulatory complications    SUBJECTIVE  Denies significant SOB at rest, only with exertion. Still has cough. Otherwise feeling ok  CURRENT MEDS . allopurinol  100 mg Oral BID  . apixaban  5 mg Oral BID  . budesonide  0.25 mg Nebulization BID  . diltiazem  120 mg Oral Daily  . fluticasone  1 spray Each Nare Daily  . furosemide  40 mg Oral BID  . guaiFENesin  1,200 mg Oral BID  . insulin aspart  0-15 Units Subcutaneous TID WC  . insulin aspart  0-5 Units Subcutaneous QHS  . insulin aspart  3 Units Subcutaneous TID WC  . insulin glargine  23 Units Subcutaneous BID  . ipratropium-albuterol  3 mL Nebulization Q6H  . isosorbide mononitrate  30 mg Oral Daily  . levofloxacin  250 mg Oral Daily  . levothyroxine  25 mcg Oral Once per day on Mon Wed Fri  . levothyroxine  50 mcg Oral Once per day on Sun Tue Thu Sat  . methocarbamol  500 mg Oral QHS  . metoprolol  50 mg Oral BID  . oxybutynin  10 mg Oral Daily  . polyethylene glycol  17 g Oral Daily  . predniSONE  30 mg Oral Q breakfast  . senna-docusate  1 tablet Oral BID  . sodium chloride  3 mL Intravenous Q12H  . sodium chloride  3 mL Intravenous Q12H  . sodium chloride  3 mL Intravenous Q12H    OBJECTIVE  Filed Vitals:   03/25/15 0400 03/25/15 0515 03/25/15 0901 03/25/15 0936  BP:  105/65  126/49  Pulse:  72  100  Temp:  97.6 F (36.4 C)    TempSrc:  Oral    Resp:  17  18  Height:      Weight: 175 lb 4.8 oz (79.516 kg)     SpO2:  98% 90% 96%    Intake/Output Summary (Last 24 hours) at 03/25/15 1124 Last data filed at 03/25/15  1100  Gross per 24 hour  Intake   1400 ml  Output   3050 ml  Net  -1650 ml   Filed Weights   03/23/15 0551 03/24/15 0437 03/25/15 0400  Weight: 175 lb 1.6 oz (79.425 kg) 174 lb 11.2 oz (79.243 kg) 175 lb 4.8 oz (79.516 kg)    PHYSICAL EXAM  General: Pleasant, NAD. Neuro: Alert and oriented X 3. Moves all extremities spontaneously. Psych: Normal affect. HEENT:  Normal  Neck: Supple without bruits or JVD. Lungs:  Resp regular and unlabored, CTA. Heart: irregular. no s3, s4, or murmurs. Abdomen: Soft, non-tender, non-distended, BS + x 4.  Extremities: No clubbing, cyanosis or edema. DP/PT/Radials 2+ and equal bilaterally.  Accessory Clinical Findings  Basic Metabolic Panel  Recent Labs  03/24/15 0503 03/25/15 0316  NA 137 136  K 3.7 4.2  CL 87* 85*  CO2 38* 43*  GLUCOSE 131* 226*  BUN 67* 72*  CREATININE 1.67* 1.97*  CALCIUM 9.0 8.7*    TELE A-fib with HR 80s    ECG  No new EKG  Echocardiogram 03/17/2015  LV EF: 60% -  65%  -------------------------------------------------------------------  Indications:   CHF - 428.0.  ------------------------------------------------------------------- History:  PMH: Anemia. Obesity. Secondary pulmonary hypertension. Risk factors: Diabetes mellitus.  ------------------------------------------------------------------- Study Conclusions  - Left ventricle: The cavity size was normal. There was mild concentric hypertrophy. Systolic function was normal. The estimated ejection fraction was in the range of 60% to 65%. Wall motion was normal; there were no regional wall motion abnormalities. - Aortic valve: There was trivial regurgitation. - Mitral valve: Severely calcified annulus. - Left atrium: The atrium was moderately dilated. - Right ventricle: The cavity size was severely dilated. Systolic function was moderately reduced. - Right atrium: The atrium was severely dilated. - Tricuspid valve: There was  moderate regurgitation. - Pulmonary arteries: Systolic pressure was severely increased. PA peak pressure: 68 mm Hg (S).    Radiology/Studies  Dg Chest 2 View  03/25/2015   CLINICAL DATA:  Shortness of breath, cough, history of COPD, CHF, pulmonary hypertension.  EXAM: CHEST  2 VIEW  COMPARISON:  PA and lateral chest x-ray of March 18, 2015  FINDINGS: The appearance of the pulmonary interstitium has improved which likely reflects some resolution of interstitial edema. The cardiac silhouette remains enlarged. The pulmonary vascularity is less prominent centrally. There is no pleural effusion. There is wedge compression of the body of T12 which is stable and there is mild loss of height of approximately T3 and T5. There is a prosthetic right shoulder joint.  IMPRESSION: Slight interval improvement in the appearance of the chest. There are chronic bronchitic changes with decreasing pulmonary interstitial edema.   Electronically Signed   By: David  Martinique M.D.   On: 03/25/2015 07:56   Dg Chest 2 View  03/18/2015   CLINICAL DATA:  Cough and nausea for 2 months, dyspnea, coronary artery disease, CHF, atrial fibrillation, COPD, diabetes mellitus, coronary artery disease, former smoker  EXAM: CHEST  2 VIEW  COMPARISON:  03/16/2015  FINDINGS: Enlargement of cardiac silhouette with vascular congestion.  Atherosclerotic calcification aorta.  Peribronchial thickening with chronic accentuation of interstitial markings little changed.  Mitral annular calcification noted.  Bibasilar atelectasis.  No pulmonary infiltrate, pleural effusion or pneumothorax.  Osseous demineralization with compression deformity of a vertebral body at the thoracolumbar junction question T12 unchanged.  RIGHT shoulder prosthesis with LEFT glenohumeral degenerative changes noted.  Surgical clips RIGHT upper quadrant likely prior cholecystectomy.  IMPRESSION: Chronic bronchitic and interstitial changes with bibasilar atelectasis.  Enlargement of  cardiac silhouette with pulmonary vascular congestion.   Electronically Signed   By: Lavonia Dana M.D.   On: 03/18/2015 10:14   Dg Chest 2 View  03/16/2015   CLINICAL DATA:  Fall today. Left sided chest pain and shortness of breath. Initial encounter. COPD and congestive heart failure.  EXAM: CHEST  2 VIEW  COMPARISON:  Seven hundred fourteen  FINDINGS: Mild to moderate cardiomegaly remains stable. Both lungs are clear. No evidence of pulmonary infiltrate or edema. No evidence of pneumothorax or pleural effusion. Right shoulder prosthesis again noted.  IMPRESSION: Stable cardiomegaly.  No active lung disease.   Electronically Signed   By: Earle Gell M.D.   On: 03/16/2015 20:45   Ct Head Wo Contrast  03/16/2015   CLINICAL DATA:  Tripped on carpet. Hit head on site of table. Headache, scalp hematoma, and neck pain.  EXAM: CT HEAD WITHOUT CONTRAST  CT CERVICAL SPINE WITHOUT CONTRAST  TECHNIQUE: Multidetector CT imaging of the head and cervical spine was performed following the standard protocol without intravenous contrast. Multiplanar CT image reconstructions of the cervical spine  were also generated.  COMPARISON:  Head CT on 12/05/2014 and cervical spine CT on 12/31/2009  FINDINGS: CT HEAD FINDINGS  There is no evidence of intracranial hemorrhage, brain edema, or other signs of acute infarction. There is no evidence of intracranial mass lesion or mass effect. No abnormal extraaxial fluid collections are identified.  Mild to moderate chronic small vessel disease noted. No evidence of hydrocephalus. No evidence of skull fracture or pneumocephalus.  CT CERVICAL SPINE FINDINGS  No evidence of acute fracture, subluxation, or prevertebral soft tissue swelling.  Cervical degenerative disc disease is seen at most levels which is most pronounced at C4-5, C5-6, and C6-7. Mild to moderate facet DJD is also seen bilaterally at these levels. Atlantoaxial degenerative changes also noted. Generalized osteopenia demonstrated.   IMPRESSION: No acute intracranial abnormality. Mild to moderate chronic small vessel disease.  No evidence of acute cervical spine fracture or subluxation. Degenerative spondylosis, as described above. Osteopenia.   Electronically Signed   By: Earle Gell M.D.   On: 03/16/2015 21:28   Ct Cervical Spine Wo Contrast  03/16/2015   CLINICAL DATA:  Tripped on carpet. Hit head on site of table. Headache, scalp hematoma, and neck pain.  EXAM: CT HEAD WITHOUT CONTRAST  CT CERVICAL SPINE WITHOUT CONTRAST  TECHNIQUE: Multidetector CT imaging of the head and cervical spine was performed following the standard protocol without intravenous contrast. Multiplanar CT image reconstructions of the cervical spine were also generated.  COMPARISON:  Head CT on 12/05/2014 and cervical spine CT on 12/31/2009  FINDINGS: CT HEAD FINDINGS  There is no evidence of intracranial hemorrhage, brain edema, or other signs of acute infarction. There is no evidence of intracranial mass lesion or mass effect. No abnormal extraaxial fluid collections are identified.  Mild to moderate chronic small vessel disease noted. No evidence of hydrocephalus. No evidence of skull fracture or pneumocephalus.  CT CERVICAL SPINE FINDINGS  No evidence of acute fracture, subluxation, or prevertebral soft tissue swelling.  Cervical degenerative disc disease is seen at most levels which is most pronounced at C4-5, C5-6, and C6-7. Mild to moderate facet DJD is also seen bilaterally at these levels. Atlantoaxial degenerative changes also noted. Generalized osteopenia demonstrated.  IMPRESSION: No acute intracranial abnormality. Mild to moderate chronic small vessel disease.  No evidence of acute cervical spine fracture or subluxation. Degenerative spondylosis, as described above. Osteopenia.   Electronically Signed   By: Earle Gell M.D.   On: 03/16/2015 21:28    ASSESSMENT AND PLAN  22F with CAD, PAF, chronic respiratory failure on home O2/chronic lung disease,  bradycardia, OSA< prior DVT, chronic diastolic CHF, HTN, renal artery stenosis, DM who presented 8/3 with SOB and fall after losing balance. Found to have severe pulm HTN and RHF. Echo 03/17/15: EF 60-65%, no RWMA, severely calcified mitral annulus, mod dilated LA, severely dilated RV with moderately reduced RV function, mod TR, severe RAE, severe pulm HTN PASP 45mmHg.  1. Acute on chronic biventricular HF with worsening pulm HTN  - Severe pulmonary HTN and secondary right heart failure with multiple possible causes. COPD, OSA, possible diastolic left heart failure (unable to evaluate on echo due to arrhythmia, suggested by elevated BNP).   - CTEP unlikely (already on anticoagulant for atrial fibrillation).   - RHC 03/21/15 showed moderate PAH with combination of L & R sided features, moderately depressed cardiac output.   - asked by IM to see this morning again to determine why her Cr trended up from 1.6 to 1.9, IM  is considering discharge, talking with the patient, she has been compliant with Na and fluid restriction at home and daily weight. She has been on 40mg  BID PO lasix for many yrs. She did receive 2 dose of metolazone, however that was several days ago. Her urine output on 40mg  BID PO lasix is higher than what I expect, roughly 2700-3464ml per day whereas her intake here has only been 700-131ml per day. I have discussed possible options with her include discharge and have outpatient BMET next Wednesday vs decrease her lasix to 60mg  daily. Since she did came in with HF, i am hesitant to decrease it. Will discuss with MD on recommendation regarding her discharge lasix dose. Either way, I will arrange outpatient BMET Next Wed.    2. Chronic lung dx on home O2  3. PAF on eliquis  - managed with rate control strategy only. Back in AF this admission. Sotalol previously d/c'd due to renal insufficiency and amiodarone previously stopped due to nausea.  - Patient declined prior hospitalization for Tikosyn  in the past and was not a candidate for flecainide due to CAD.  - rate controlled on BB and diltiazem. Diltiazem decreased to 120mg  daily due to bradycardia. (note patient will need 2 Rx on discharge as she will need 5 day worth to get from her local pharmacy and second Rx to [facility] [place] to [facility]ill)  4. CAD s/p stent to LAD 2008  5. Acute on chronic renal insufficiency stage III  6. DM  Signed, Almyra Deforest PA-C Pager: [phone]   Patient seen and examined. Agree with assessment and plan. Marked diuresis -13,429 with contraction alkalosis CO2 43. Will need to reduce furosemide dose ? acetozolamide  with CO2 43.  Would decrease lasix to 60 mg am dosing with reduced lasix at 60 mg dauily; f/u bmet in 3 days and outpatient and ov in 1 week.   Troy Sine, MD, [facility] 03/25/2015 2:42 PM

## 2015-03-25 NOTE — Progress Notes (Signed)
Inpatient Diabetes Program Recommendations  AACE/ADA: New Consensus Statement on Inpatient Glycemic Control (2013)  Target Ranges:  Prepandial:   less than 140 mg/dL      Peak postprandial:   less than 180 mg/dL (1-2 hours)      Critically ill patients:  140 - 180 mg/dL   Fastings stable. Post-prandials are elevated. Please consider increase in meal coverage to 4-5 units tidwc.  Thank you Rosita Kea, RN, MSN, CDE  Diabetes Inpatient Program Office: [phone] Pager: [phone] 8:00 am to 5:00 pm

## 2015-03-25 NOTE — Progress Notes (Signed)
Patient is active with [facility] for HHRN/ PT/ OT as prior to admission; Aneta Mins [phone]

## 2015-03-28 ENCOUNTER — Emergency Department (HOSPITAL_COMMUNITY): Payer: Medicare Other

## 2015-03-28 ENCOUNTER — Inpatient Hospital Stay (HOSPITAL_COMMUNITY)
Admission: EM | Admit: 2015-03-28 | Discharge: 2015-04-02 | DRG: 682 | Disposition: A | Discharge status: Home / Self Care | Payer: Medicare Other | Attending: Internal Medicine | Admitting: Internal Medicine

## 2015-03-28 ENCOUNTER — Encounter (HOSPITAL_COMMUNITY): Payer: Self-pay | Admitting: *Deleted

## 2015-03-28 DIAGNOSIS — N184 Chronic kidney disease, stage 4 (severe): Secondary | ICD-10-CM | POA: Diagnosis present

## 2015-03-28 DIAGNOSIS — K219 Gastro-esophageal reflux disease without esophagitis: Secondary | ICD-10-CM | POA: Diagnosis present

## 2015-03-28 DIAGNOSIS — E1165 Type 2 diabetes mellitus with hyperglycemia: Secondary | ICD-10-CM | POA: Diagnosis present

## 2015-03-28 DIAGNOSIS — I701 Atherosclerosis of renal artery: Secondary | ICD-10-CM | POA: Diagnosis present

## 2015-03-28 DIAGNOSIS — N179 Acute kidney failure, unspecified: Secondary | ICD-10-CM | POA: Diagnosis not present

## 2015-03-28 DIAGNOSIS — E039 Hypothyroidism, unspecified: Secondary | ICD-10-CM | POA: Diagnosis present

## 2015-03-28 DIAGNOSIS — I5189 Other ill-defined heart diseases: Secondary | ICD-10-CM

## 2015-03-28 DIAGNOSIS — N189 Chronic kidney disease, unspecified: Secondary | ICD-10-CM

## 2015-03-28 DIAGNOSIS — Z79891 Long term (current) use of opiate analgesic: Secondary | ICD-10-CM

## 2015-03-28 DIAGNOSIS — Z86718 Personal history of other venous thrombosis and embolism: Secondary | ICD-10-CM

## 2015-03-28 DIAGNOSIS — M810 Age-related osteoporosis without current pathological fracture: Secondary | ICD-10-CM | POA: Diagnosis present

## 2015-03-28 DIAGNOSIS — I519 Heart disease, unspecified: Secondary | ICD-10-CM

## 2015-03-28 DIAGNOSIS — Z7952 Long term (current) use of systemic steroids: Secondary | ICD-10-CM

## 2015-03-28 DIAGNOSIS — Z833 Family history of diabetes mellitus: Secondary | ICD-10-CM

## 2015-03-28 DIAGNOSIS — Z6835 Body mass index (BMI) 35.0-35.9, adult: Secondary | ICD-10-CM

## 2015-03-28 DIAGNOSIS — I2510 Atherosclerotic heart disease of native coronary artery without angina pectoris: Secondary | ICD-10-CM | POA: Diagnosis present

## 2015-03-28 DIAGNOSIS — I482 Chronic atrial fibrillation: Secondary | ICD-10-CM | POA: Diagnosis present

## 2015-03-28 DIAGNOSIS — I5033 Acute on chronic diastolic (congestive) heart failure: Secondary | ICD-10-CM | POA: Diagnosis present

## 2015-03-28 DIAGNOSIS — E662 Morbid (severe) obesity with alveolar hypoventilation: Secondary | ICD-10-CM | POA: Diagnosis present

## 2015-03-28 DIAGNOSIS — J9611 Chronic respiratory failure with hypoxia: Secondary | ICD-10-CM

## 2015-03-28 DIAGNOSIS — Z8249 Family history of ischemic heart disease and other diseases of the circulatory system: Secondary | ICD-10-CM

## 2015-03-28 DIAGNOSIS — J441 Chronic obstructive pulmonary disease with (acute) exacerbation: Secondary | ICD-10-CM | POA: Diagnosis present

## 2015-03-28 DIAGNOSIS — G4733 Obstructive sleep apnea (adult) (pediatric): Secondary | ICD-10-CM | POA: Diagnosis present

## 2015-03-28 DIAGNOSIS — I272 Other secondary pulmonary hypertension: Secondary | ICD-10-CM | POA: Diagnosis present

## 2015-03-28 DIAGNOSIS — Z881 Allergy status to other antibiotic agents status: Secondary | ICD-10-CM

## 2015-03-28 DIAGNOSIS — I69320 Aphasia following cerebral infarction: Secondary | ICD-10-CM

## 2015-03-28 DIAGNOSIS — I130 Hypertensive heart and chronic kidney disease with heart failure and stage 1 through stage 4 chronic kidney disease, or unspecified chronic kidney disease: Secondary | ICD-10-CM | POA: Diagnosis present

## 2015-03-28 DIAGNOSIS — Z7901 Long term (current) use of anticoagulants: Secondary | ICD-10-CM

## 2015-03-28 DIAGNOSIS — J9621 Acute and chronic respiratory failure with hypoxia: Secondary | ICD-10-CM | POA: Diagnosis present

## 2015-03-28 DIAGNOSIS — Z955 Presence of coronary angioplasty implant and graft: Secondary | ICD-10-CM

## 2015-03-28 DIAGNOSIS — I480 Paroxysmal atrial fibrillation: Secondary | ICD-10-CM | POA: Diagnosis present

## 2015-03-28 DIAGNOSIS — I509 Heart failure, unspecified: Secondary | ICD-10-CM

## 2015-03-28 DIAGNOSIS — M1990 Unspecified osteoarthritis, unspecified site: Secondary | ICD-10-CM | POA: Diagnosis present

## 2015-03-28 DIAGNOSIS — J45909 Unspecified asthma, uncomplicated: Secondary | ICD-10-CM | POA: Diagnosis present

## 2015-03-28 DIAGNOSIS — Z87891 Personal history of nicotine dependence: Secondary | ICD-10-CM

## 2015-03-28 DIAGNOSIS — I4891 Unspecified atrial fibrillation: Secondary | ICD-10-CM | POA: Diagnosis present

## 2015-03-28 DIAGNOSIS — Z79899 Other long term (current) drug therapy: Secondary | ICD-10-CM

## 2015-03-28 DIAGNOSIS — E1122 Type 2 diabetes mellitus with diabetic chronic kidney disease: Secondary | ICD-10-CM | POA: Diagnosis present

## 2015-03-28 DIAGNOSIS — I5032 Chronic diastolic (congestive) heart failure: Secondary | ICD-10-CM | POA: Diagnosis present

## 2015-03-28 DIAGNOSIS — Z7951 Long term (current) use of inhaled steroids: Secondary | ICD-10-CM

## 2015-03-28 DIAGNOSIS — Z9981 Dependence on supplemental oxygen: Secondary | ICD-10-CM

## 2015-03-28 DIAGNOSIS — I10 Essential (primary) hypertension: Secondary | ICD-10-CM | POA: Diagnosis present

## 2015-03-28 DIAGNOSIS — IMO0002 Reserved for concepts with insufficient information to code with codable children: Secondary | ICD-10-CM

## 2015-03-28 DIAGNOSIS — Z825 Family history of asthma and other chronic lower respiratory diseases: Secondary | ICD-10-CM

## 2015-03-28 DIAGNOSIS — Z794 Long term (current) use of insulin: Secondary | ICD-10-CM

## 2015-03-28 LAB — I-STAT TROPONIN, ED: TROPONIN I, POC: 0.02 ng/mL (ref 0.00–0.08)

## 2015-03-28 LAB — CBC
HCT: 41.9 % (ref 36.0–46.0)
Hemoglobin: 13.4 g/dL (ref 12.0–15.0)
MCH: 30.5 pg (ref 26.0–34.0)
MCHC: 32 g/dL (ref 30.0–36.0)
MCV: 95.4 fL (ref 78.0–100.0)
PLATELETS: 203 10*3/uL (ref 150–400)
RBC: 4.39 MIL/uL (ref 3.87–5.11)
RDW: 15.3 % (ref 11.5–15.5)
WBC: 9.7 10*3/uL (ref 4.0–10.5)

## 2015-03-28 LAB — BASIC METABOLIC PANEL
Anion gap: 12 (ref 5–15)
BUN: 82 mg/dL — ABNORMAL HIGH (ref 6–20)
CO2: 29 mmol/L (ref 22–32)
CREATININE: 2.66 mg/dL — AB (ref 0.44–1.00)
Calcium: 8.7 mg/dL — ABNORMAL LOW (ref 8.9–10.3)
Chloride: 90 mmol/L — ABNORMAL LOW (ref 101–111)
GFR calc Af Amer: 19 mL/min — ABNORMAL LOW (ref >60)
GFR calc non Af Amer: 16 mL/min — ABNORMAL LOW (ref >60)
Glucose, Bld: 418 mg/dL — ABNORMAL HIGH (ref 65–99)
Potassium: 5 mmol/L (ref 3.5–5.1)
SODIUM: 131 mmol/L — AB (ref 135–145)

## 2015-03-28 LAB — BRAIN NATRIURETIC PEPTIDE: B NATRIURETIC PEPTIDE 5: 1496.2 pg/mL — AB (ref 0.0–100.0)

## 2015-03-28 MED ORDER — INSULIN GLARGINE 100 UNIT/ML ~~LOC~~ SOLN
35.0000 [IU] | Freq: Every day | SUBCUTANEOUS | Status: DC
  Administered 2015-03-29: 35 [IU] via SUBCUTANEOUS
  Filled 2015-03-28 (×2): qty 0.35

## 2015-03-28 MED ORDER — INSULIN GLARGINE 100 UNIT/ML ~~LOC~~ SOLN
20.0000 [IU] | Freq: Every day | SUBCUTANEOUS | Status: DC
  Administered 2015-03-29: 20 [IU] via SUBCUTANEOUS
  Filled 2015-03-28 (×2): qty 0.2

## 2015-03-28 MED ORDER — INSULIN ASPART 100 UNIT/ML ~~LOC~~ SOLN
0.0000 [IU] | Freq: Every day | SUBCUTANEOUS | Status: DC
  Administered 2015-03-29 (×2): 3 [IU] via SUBCUTANEOUS
  Administered 2015-03-30 – 2015-04-01 (×2): 5 [IU] via SUBCUTANEOUS

## 2015-03-28 MED ORDER — INSULIN ASPART 100 UNIT/ML ~~LOC~~ SOLN
0.0000 [IU] | Freq: Three times a day (TID) | SUBCUTANEOUS | Status: DC
  Administered 2015-03-29: 5 [IU] via SUBCUTANEOUS
  Administered 2015-03-29: 2 [IU] via SUBCUTANEOUS
  Administered 2015-03-29: 5 [IU] via SUBCUTANEOUS
  Administered 2015-03-30: 2 [IU] via SUBCUTANEOUS
  Administered 2015-03-30: 3 [IU] via SUBCUTANEOUS
  Administered 2015-03-30: 5 [IU] via SUBCUTANEOUS
  Administered 2015-03-31: 8 [IU] via SUBCUTANEOUS
  Administered 2015-03-31: 3 [IU] via SUBCUTANEOUS
  Administered 2015-03-31 – 2015-04-01 (×2): 8 [IU] via SUBCUTANEOUS
  Administered 2015-04-01: 15 [IU] via SUBCUTANEOUS
  Administered 2015-04-02: 5 [IU] via SUBCUTANEOUS
  Filled 2015-03-28: qty 1

## 2015-03-28 NOTE — ED Notes (Addendum)
MD at the bedside  

## 2015-03-28 NOTE — H&P (Addendum)
[facility] History and Physical  [name] QMG:[id number] DOB: Jul 10, 1934 DOA: 03/28/2015  Referring physician: Dr Alvino Chapel PCP: Cari Caraway, MD   Chief Complaint: Told to come to ED because office labs were abnormal  HPI: [name] is a 81 y.o. female with history of CAD/ afib, obesity, OSA/OHS with chronic resp failure on home O2 2L, hx diast CHF, CKD stage 3 and history of multiple admissions for acute/ chronic resp failure. She refused anticoagulation for afib for years, then had aphasia / TIA in April and was started on Eliquis by cardiology. She was here last week with acute/ chronic diast CHF with vasc congestion on CXR/ SOB.  She was diuresed and dc'd 3 d ago.  She went to her PCP today and creat was up so sent to ED.  Here the creat is 2.6, it was 1.6- 1.9 last week while here for diuresis. She was dc'd on 60 mg lasix per day, which is lower than her prior dose of 40 bid.    She denies active SOB, has chronic DOE. No new cough, CP, fever, chills or sweats. Had renal artery stented 2008.  Denies abd pain, n/v/d, no joint pain, wounds. No HA, sore throat.    She sees Pulm in clinic for chronic resp failure. Former smoker with OSA/ OHS, severe RV dysfunction/ pulm HTN. Uses CPAP at home. Unclear if she has underlying COPD or not per OP notes.   Chart review: '06 - R shoulder hemiarthroplasty, COPD/ asthma, HTN, GERD, NIDDM '08 - admitted for claudication, had aortogram showing patent aortoiliac systems. However L RAS was noted at 70% and was therefore stented.  Hx ovarian cancer, DM, obestiy, HTN, bradycardia.  '09 - SOB due to COPD exacerbation vs fluid overload vs anemia. Diuresed with IV lasix and improved. Also rx with course of abx.  Anemia had colonscopy, no bleeding noted. afib with bradycardia, seen by cards sotalol was started. dilt and atenolol were stopped.  10/14 - SOB/ prod cough/ sputum / fever > comm-acquired PNA + fluid overload , rx with abx  and lasix. On home O2.   11/14 - afib/ RVR, cont dilt and MTP. Not candidate for TEE/ cardioversion of for ablation as she refuses anticoagulation. Hx of multiple falls. AKI creat baseline is 1.5. Severe PCM.  12/14 - acute on chronic diast CHF, rx with lasix/ diuresis. Possilbe HCAP vs asp PNA, rx with IV abx. MBS completes, D3 diet. COPD ?exacerbation, rx with steroid taper. Afib with RVR, seen by cards, rx with dilt/ MTP, has not tolerated amio due to nausea, cont plavix.  Patient refusing coumadin due to falls.DM 2/15 - a/c resp failure/ a/c diast HF. Diuresed. No abx. OSA / COPD on home O2.  4/16 - difficulty speaking > dx'd with TIA and did receive TPA.  MRI negative for acute CVA, MRA showed nonvisualization of left vert artery. Carotid dopp normal. ECHO normal. Felt prob secondary to afib (not on AC due to falls/ pt preference). Started on Eliquis 5 mg bid by cardiology. CKD with creat 1.67.  8/3 - 03/25/15 > a/c diast HF, SOB/ PND/ vasc congestion on CXR. R heart cath and echo done. Diuresed. dc'd home.    Where does patient live home w husband Can patient participate in ADLs? yes  Past Medical History  Past Medical History  Diagnosis Date  . CAD (coronary artery disease)   . CHF (congestive heart failure)   . Paroxysmal atrial fibrillation   . Bradycardia   .  Hypertension   . Renal artery stenosis   . COPD (chronic obstructive pulmonary disease)   . Obesity hypoventilation syndrome   . Pruritus   . Tracheobronchitis   . Acute rhinosinusitis   . Anemia   . Hypoxemia   . Peripheral edema   . Obesity, endogenous   . Diabetes mellitus   . Acid reflux disease   . Osteoarthritis, generalized   . Hypothyroidism   . Lung nodule     lingula  . OSA (obstructive sleep apnea)     NPSG 04/03/00--AHI 28.hr  . DVT (deep venous thrombosis) 2003    left leg  . Vaginal atrophy   . Hx of colonic polyps   . Acute diastolic heart failure   . Secondary pulmonary hypertension 06/09/2013   . Blood transfusion without reported diagnosis 2009    had internal bleeding  . Cancer 1959    ovarian cancer  . Osteoporosis    Past Surgical History  Past Surgical History  Procedure Laterality Date  . Lumbar spine surgery    . Cholecystectomy    . Hemorroidectomy    . Repair wound fistula    . Right shoulder    . Elbow surgery    . Dilation and curettage of uterus    . Breast surgery  1998    lumpectomy  . Oophorectomy  1959    BSO-? ovarian cancer  . Bilateral salpingoophorectomy  1959    -d/t ovarian cancer  . Rotator cuff repair Right   . Cardiac catheterization N/A 03/21/2015    Procedure: Right Heart Cath;  Surgeon: Jolaine Artist, MD;  Location: [facility];  Service: Cardiovascular;  Laterality: N/A;   Family History  Family History  Problem Relation Age of Onset  . Pneumonia Mother   . Heart disease Mother   . Allergies Mother   . Emphysema Mother   . Arthritis Mother   . Hypertension Mother   . Heart attack Father   . Hypertension Father   . Diabetes Sister   . Heart disease Sister   . Diabetes Brother   . Diabetes      grandfather  . Breast cancer      aunt  . Bone cancer      aunt  . Clotting disorder Brother   . Arthritis Sister   . Breast cancer Maternal Aunt    Social History  reports that she quit smoking about 36 years ago. She has never used smokeless tobacco. She reports that she does not drink alcohol or use illicit drugs. Allergies  Allergies  Allergen Reactions  . Cefuroxime Axetil Shortness Of Breath, Swelling and Rash    Throat swelling  . Cephalexin Shortness Of Breath, Swelling and Rash    Throat swelling  . Clarithromycin Shortness Of Breath, Swelling and Rash    Throat swelling  . Doxycycline Shortness Of Breath, Swelling and Rash     rash on legs and feet, throat swelling  . Erythromycin Shortness Of Breath, Swelling and Rash    Throat swelling  . Tetracycline Shortness Of Breath, Swelling and Rash    Throat  swelling   Home medications Prior to Admission medications   Medication Sig Start Date End Date Taking? Authorizing Provider  acetaminophen (TYLENOL) 500 MG tablet Take 1,000 mg by mouth every 6 (six) hours as needed.   Yes Historical Provider, MD  albuterol (PROAIR HFA) 108 (90 BASE) MCG/ACT inhaler 1-2 puffs QID prn Patient taking differently: Inhale 1 puff into the  lungs every 4 (four) hours as needed for shortness of breath.  11/12/13  Yes Deneise Lever, MD  albuterol (PROVENTIL) (2.5 MG/3ML) 0.083% nebulizer solution Take 3 mLs (2.5 mg total) by nebulization every 2 (two) hours as needed for wheezing or shortness of breath. 03/25/15  Yes Shanker Kristeen Mans, MD  allopurinol (ZYLOPRIM) 100 MG tablet Take 1 tablet (100 mg total) by mouth daily. 07/21/13  Yes Samella Parr, NP  apixaban (ELIQUIS) 5 MG TABS tablet Take 1 tablet (5 mg total) by mouth 2 (two) times daily. 01/06/15  Yes Jettie Booze, MD  azelastine (ASTELIN) 137 MCG/SPRAY nasal spray 1-2 sprays each nostril once or twice daily 11/05/13  Yes Clinton D Young, MD  budesonide (PULMICORT) 0.25 MG/2ML nebulizer solution Take 2 mLs (0.25 mg total) by nebulization daily. 05/13/14  Yes Deneise Lever, MD  colchicine 0.6 MG tablet Take 1.2 mg by mouth daily as needed (for gout flareups).    Yes Historical Provider, MD  diltiazem (CARDIZEM CD) 120 MG 24 hr capsule Take 1 capsule (120 mg total) by mouth daily. 03/25/15  Yes Shanker Kristeen Mans, MD  furosemide (LASIX) 40 MG tablet Take 1.5 tablets (60 mg total) by mouth daily. Contact cardiology office or PCP if you experienced > 3 pounds overnight or > 5 pounds in 1 week for further instructions 03/26/15  Yes Shanker Kristeen Mans, MD  guaiFENesin (MUCINEX) 600 MG 12 hr tablet Take 2 tablets (1,200 mg total) by mouth 2 (two) times daily. 03/25/15  Yes Shanker Kristeen Mans, MD  insulin glargine (LANTUS) 100 UNIT/ML injection Inject 0.2 mLs (20 Units total) into the skin daily. Patient taking differently:  Inject 20-35 Units into the skin 2 (two) times daily. Takes 20 units in am and 35 units in pm 07/21/13  Yes Samella Parr, NP  insulin lispro (HUMALOG) 100 UNIT/ML injection Inject 2 Units into the skin 2 (two) times daily with breakfast and lunch. Per sliding scale. Patient unsure about how much she really takes   Yes Historical Provider, MD  isosorbide mononitrate (IMDUR) 30 MG 24 hr tablet 1 tablet by mouth daily Patient taking differently: Take 30 mg by mouth daily. 1 tablet by mouth daily 01/06/15  Yes Jettie Booze, MD  levothyroxine (SYNTHROID, LEVOTHROID) 25 MCG tablet Take 25 mcg by mouth See admin instructions. Take 1 tablet (25 mcg) on Monday, Wednesday, Friday   Yes Historical Provider, MD  levothyroxine (SYNTHROID, LEVOTHROID) 50 MCG tablet Take 50 mcg by mouth See admin instructions. Take 66m on Tues, Thurs, Sat and sundays   Yes Historical Provider, MD  methocarbamol (ROBAXIN) 500 MG tablet Take 500 mg by mouth at bedtime.   Yes Historical Provider, MD  metoprolol (LOPRESSOR) 50 MG tablet 1 tablet by mouth twice a day Patient taking differently: Take 50 mg by mouth 2 (two) times daily. 1 tablet by mouth twice a day 01/06/15  Yes JJettie Booze MD  nitroGLYCERIN (NITROSTAT) 0.4 MG SL tablet Place 0.4 mg under the tongue every 5 (five) minutes as needed for chest pain.    Yes Historical Provider, MD  oxybutynin (DITROPAN XL) 10 MG 24 hr tablet Take 10 mg by mouth daily.    Yes Historical Provider, MD  polyethylene glycol (MIRALAX / GLYCOLAX) packet Take 17 g by mouth daily. 03/25/15  Yes Shanker MKristeen Mans MD  predniSONE (DELTASONE) 10 MG tablet Take 3 tablets (30 mg) daily for 2 days, then, Take 2 tablets (20 mg) daily for 2 days,  then, Take 1 tablets (10 mg) daily for 1 days, then stop 03/25/15  Yes Shanker Kristeen Mans, MD  senna-docusate (SENOKOT-S) 8.6-50 MG per tablet Take 1 tablet by mouth at bedtime as needed for mild constipation. 03/25/15  Yes Shanker Kristeen Mans, MD   traMADol (ULTRAM) 50 MG tablet Take 50 mg by mouth every 6 (six) hours as needed for moderate pain.    Yes Historical Provider, MD  levofloxacin (LEVAQUIN) 250 MG tablet Take 1 tablet (250 mg total) by mouth daily. Patient not taking: Reported on 03/28/2015 03/25/15   Jonetta Osgood, MD   Liver Function Tests No results for input(s): AST, ALT, ALKPHOS, BILITOT, PROT, ALBUMIN in the last 168 hours. No results for input(s): LIPASE, AMYLASE in the last 168 hours. CBC  Recent Labs Lab 03/22/15 0418 03/28/15 1858  WBC 6.5 9.7  HGB 12.0 13.4  HCT 37.9 41.9  MCV 95.0 95.4  PLT 167 102   Basic Metabolic Panel  Recent Labs Lab 03/21/15 2250 03/22/15 0418 03/23/15 0356 03/24/15 0503 03/25/15 0316 03/28/15 1858  NA 131* 135 138 137 136 131*  K 4.0 3.6 3.5 3.7 4.2 5.0  CL 83* 89* 88* 87* 85* 90*  CO2 36* 37* 40* 38* 43* 29  GLUCOSE 539* 260* 160* 131* 226* 418*  BUN 74* 75* 66* 67* 72* 82*  CREATININE 2.02* 1.94* 1.62* 1.67* 1.97* 2.66*  CALCIUM 8.8* 9.0 9.1 9.0 8.7* 8.7*    EKG: Independently reviewed > afib, rightward axis  CXR: independently reviewed > no active disease, no pulm edema or vasc congestion  Filed Vitals:   03/28/15 2045 03/28/15 2100 03/28/15 2115 03/28/15 2130  BP: 147/72 144/74 147/82 151/99  Pulse: 108 92 77 79  Temp:      TempSrc:      Resp: _0 Height:      Weight:      SpO2: 96% 97% 95% 96%   Exam: Chronically ill-appearing pleasant elderly WF, not in distress No rash, cyanosis or gangrene Sclera anicteric, throat clear No jvd Chest is clear throughout, no rales or wheezing, good air movement Irreg rhythm, no MRG Abd soft, ntnd no ascites or HSM GU deferred MS no joint effusion, deformity Ext no LE or UE edema, diffuse varicosities of LE's and feet Neuro is alert, Ox 3, nonfocal  Na 131 K 5.0  CO2 29  BUN 82  Creat 2.66  Ca 8.7 glu 416 BNP 1496  Trop 0.02 CXR clear , no edema WBC 9k  Hb 13  plt  203   Assessment: 1. Acute/ chronic renal failure  - was here last week and was diuresed. On exam has no excess volume, BP's normal to slightly high. BNP up but might be unreliable in CKD.  CXR and lungs clear today. Plan admit, hold diuretic, cautious IVF w NS 40/hr, f/u labs in am, consider renal consult if not improving.  2. CKD stage IV - baseline creat 1.6-1.9 (eGFR 24- 29) 3. Severe RV dysfunction 4. Chronic resp failure on home O2 (OHS/ OSA, poss COPD ) 5. DM 2 uncontrolled 6. Diastolic heart failure, chronic 7. Obesity 8. Afib on dilt/ MTP and Eliquis 9. Hx TIA / aphasia - April 2016  Plan - as above   DVT Prophylaxis lovenox  Code Status: full  Family Communication: no family here  Disposition Plan: pending    [place] D [facility] Pager [phone]  If 7PM-7AM, please contact night-coverage [email or web address] Password [facility] 03/28/2015, 10:47 PM

## 2015-03-28 NOTE — ED Provider Notes (Signed)
History   Chief Complaint  Patient presents with  . Shortness of Breath    HPI 81 year old female with past history as below notable for CHF, CAD, A. fib, COPD on 2 L chronically, diabetes with recent admission for CHF and AKI who presents ED for evaluation of shortness of breath. Patient also reports that she has had slight worsening of lower extremity edema. Denies any chest pain currently but states she did have a very brief episode of chest pain over her left chest lasting less than 1 second when she attempted to get out of the car. She states she has this from time to time and this is not uncommon for her. Patient states since being discharged she initially was improved and her breathing is now worsened. Patient's reports having a mild productive yellow cough but denies any fevers, chills, nausea, vomiting, abdominal pain, other symptoms.  Past medical/surgical history, social history, medications, allergies and FH have been reviewed with patient and/or in documentation. Furthermore, if pt family or friend(s) present, additional historical information was obtained from them.  Past Medical History  Diagnosis Date  . CAD (coronary artery disease)   . CHF (congestive heart failure)   . Paroxysmal atrial fibrillation   . Bradycardia   . Hypertension   . Renal artery stenosis   . COPD (chronic obstructive pulmonary disease)   . Obesity hypoventilation syndrome   . Pruritus   . Tracheobronchitis   . Acute rhinosinusitis   . Anemia   . Hypoxemia   . Peripheral edema   . Obesity, endogenous   . Diabetes mellitus   . Acid reflux disease   . Osteoarthritis, generalized   . Hypothyroidism   . Lung nodule     lingula  . OSA (obstructive sleep apnea)     NPSG 04/03/00--AHI 28.hr  . DVT (deep venous thrombosis) 2003    left leg  . Vaginal atrophy   . Hx of colonic polyps   . Acute diastolic heart failure   . Secondary pulmonary hypertension 06/09/2013  . Blood transfusion without  reported diagnosis 2009    had internal bleeding  . Cancer 1959    ovarian cancer  . Osteoporosis    Past Surgical History  Procedure Laterality Date  . Lumbar spine surgery    . Cholecystectomy    . Hemorroidectomy    . Repair wound fistula    . Right shoulder    . Elbow surgery    . Dilation and curettage of uterus    . Breast surgery  1998    lumpectomy  . Oophorectomy  1959    BSO-? ovarian cancer  . Bilateral salpingoophorectomy  1959    -d/t ovarian cancer  . Rotator cuff repair Right   . Cardiac catheterization N/A 03/21/2015    Procedure: Right Heart Cath;  Surgeon: Jolaine Artist, MD;  Location: [facility];  Service: Cardiovascular;  Laterality: N/A;   Family History  Problem Relation Age of Onset  . Pneumonia Mother   . Heart disease Mother   . Allergies Mother   . Emphysema Mother   . Arthritis Mother   . Hypertension Mother   . Heart attack Father   . Hypertension Father   . Diabetes Sister   . Heart disease Sister   . Diabetes Brother   . Diabetes      grandfather  . Breast cancer      aunt  . Bone cancer      aunt  .  Clotting disorder Brother   . Arthritis Sister   . Breast cancer Maternal Aunt    Social History  Substance Use Topics  . Smoking status: Former Smoker    Quit date: 08/13/1978  . Smokeless tobacco: Never Used  . Alcohol Use: No     Review of Systems Constitutional: - F/C, +fatigue.  HENT: - congestion, -rhinorrhea, -sore throat.   Eyes: - eye pain, -visual disturbance.  Respiratory: + cough, +SOB, -hemoptysis.   Cardiovascular: - CP, -palps.  Gastrointestinal: - N/V/D, -abd pain  Genitourinary: - flank pain, -dysuria, -frequency.  Musculoskeletal: - myalgia/arthritis, -joint swelling, -gait abnormality, -back pain, -neck pain/stiffness, -leg pain, + leg swelling.  Skin: - rash/lesion.  Neurological: - focal weakness, -lightheadedness, -dizziness, -numbness, -HA.  All other systems reviewed and are  negative.   Physical Exam  Physical Exam  ED Triage Vitals  Enc Vitals Group     BP 03/28/15 1846 144/66 mmHg     Pulse Rate 03/28/15 1846 72     Resp 03/28/15 1846 18     Temp 03/28/15 1846 98.8 F (37.1 C)     Temp Source 03/28/15 1846 Oral     SpO2 03/28/15 1845 93 %     Weight 03/28/15 1848 178 lb 1.6 oz (80.786 kg)     Height 03/28/15 1848 5' 0.25" (1.53 m)     Head Cir --      Peak Flow --      Pain Score --      Pain Loc --      Pain Edu? --      Excl. in [place]? --    Constitutional: Chronically ill-appearing elderly female in no apparent distress. Head: Normocephalic and atraumatic.  Eyes: Extraocular motion intact, no scleral icterus Mouth: MMM, OP clear Neck: Supple without meningismus, mass, or overt JVD Respiratory: Crackles diffusely. Mild increased work of breathing. CV: Regular rate, Irregular rhythm, no obvious murmurs.  Pulses +2 and symmetric. Patient appears mildly volume overloaded on exam. Abdomen: Soft, NT, ND, no r/g. No mass.  MSK: Extremities are atraumatic without deformity, ROM intact Skin: Warm, dry, intact without rash Neuro: AAOx4, MAE 5/5 sym, no focal deficit noted   ED Course  Procedures   Labs Reviewed  BASIC METABOLIC PANEL - Abnormal; Notable for the following:    Sodium 131 (*)    Chloride 90 (*)    Glucose, Bld 418 (*)    BUN 82 (*)    Creatinine, Ser 2.66 (*)    Calcium 8.7 (*)    GFR calc non Af Amer 16 (*)    GFR calc Af Amer 19 (*)    All other components within normal limits  BRAIN NATRIURETIC PEPTIDE - Abnormal; Notable for the following:    B Natriuretic Peptide 1496.2 (*)    All other components within normal limits  CBC  I-STAT TROPOININ, ED   I personally reviewed and interpreted all labs.  Dg Chest 2 View  03/28/2015   CLINICAL DATA:  Shortness of breath and chest pain. Recent hospital admission for CHF and renal failure.  EXAM: CHEST  2 VIEW  COMPARISON:  03/25/2015 and 09/12/2013  FINDINGS: Lungs are  adequately inflated without focal consolidation or effusion. Stable mild cardiomegaly. Calcified plaque over the aortic arch. There are degenerative changes of the spine. Right shoulder prosthesis unchanged. Moderate compression fracture over the lower thoracic spine unchanged. Mild loss of vertebral body height of several mid to upper thoracic vertebrae unchanged.  IMPRESSION: No active  cardiopulmonary disease.  Stable moderate compression fracture over the lower thoracic spine with stable loss of height of several mid to upper thoracic vertebral bodies.   Electronically Signed   By: Marin Olp M.D.   On: 03/28/2015 19:33   I personally viewed above image(s) which were used in my medical decision making. Formal interpretations by Radiology.   EKG Interpretation  Date/Time:  Monday March 28 2015 18:47:49 EDT Ventricular Rate:  86 PR Interval:    QRS Duration: 90 QT Interval:  368 QTC Calculation: 440 R Axis:   96 Text Interpretation:  Atrial fibrillation Rightward axis RSR' or QR pattern in V1 suggests right ventricular conduction delay Abnormal ECG Confirmed by Alvino Chapel  MD, Ovid Curd [phone]) on 03/28/2015 8:20:56 PM       MDM: [name] is a 81 y.o. female with H&P as above who p/w CC: SOB  On arrival, patient is hemodynamically stable and in no apparent distress. Patient appears volume overloaded on exam today. She is on her chronic 2 L nasal cannula and is satting in the mid 90s. Patient's EKG is as above and shows rate controlled A. fib and otherwise no signs of acute ischemia. Workup was notable for unremarkable chest x-ray. Labs were notable for elevated BNP.  Patient will be admitted for CHF exacerbation. Stable while in ED.  Old records reviewed (if available). Labs and imaging reviewed personally by myself and considered in medical decision making if ordered.  Clinical Impression: 1. Acute on chronic heart failure, unspecified heart failure type   2. Acute on  chronic renal failure     Disposition: Admit  Condition: stable  I have discussed the results, Dx and Tx plan with the pt(& family if present). He/she/they expressed understanding and agree(s) with the plan.  Pt seen in conjunction with Dr. Rosendo Gros, [facility] Resident - PGY-3     Kirstie Peri, MD 03/28/15 2356  Davonna Belling, MD 03/29/15 1739

## 2015-03-28 NOTE — ED Notes (Signed)
Pt reports recently being admitted to hospital for chf and renal failure. Pt was dc home and had followup dr appt today and sent here due to no improvement in kidney function. Reports still having sob and had one episode of cp today. Denies swelling.

## 2015-03-29 DIAGNOSIS — N179 Acute kidney failure, unspecified: Principal | ICD-10-CM

## 2015-03-29 DIAGNOSIS — Z7901 Long term (current) use of anticoagulants: Secondary | ICD-10-CM | POA: Diagnosis not present

## 2015-03-29 DIAGNOSIS — N189 Chronic kidney disease, unspecified: Secondary | ICD-10-CM

## 2015-03-29 DIAGNOSIS — I481 Persistent atrial fibrillation: Secondary | ICD-10-CM

## 2015-03-29 DIAGNOSIS — I5032 Chronic diastolic (congestive) heart failure: Secondary | ICD-10-CM | POA: Diagnosis not present

## 2015-03-29 DIAGNOSIS — J9610 Chronic respiratory failure, unspecified whether with hypoxia or hypercapnia: Secondary | ICD-10-CM

## 2015-03-29 LAB — GLUCOSE, CAPILLARY
GLUCOSE-CAPILLARY: 235 mg/dL — AB (ref 65–99)
GLUCOSE-CAPILLARY: 128 mg/dL — AB (ref 65–99)
Glucose-Capillary: 221 mg/dL — ABNORMAL HIGH (ref 65–99)
Glucose-Capillary: 294 mg/dL — ABNORMAL HIGH (ref 65–99)

## 2015-03-29 LAB — CBG MONITORING, ED: Glucose-Capillary: 279 mg/dL — ABNORMAL HIGH (ref 65–99)

## 2015-03-29 LAB — COMPREHENSIVE METABOLIC PANEL
ALK PHOS: 61 U/L (ref 38–126)
ALT: 43 U/L (ref 14–54)
AST: 25 U/L (ref 15–41)
Albumin: 2.9 g/dL — ABNORMAL LOW (ref 3.5–5.0)
Anion gap: 10 (ref 5–15)
BUN: 73 mg/dL — AB (ref 6–20)
CALCIUM: 8.3 mg/dL — AB (ref 8.9–10.3)
CO2: 30 mmol/L (ref 22–32)
CREATININE: 2.32 mg/dL — AB (ref 0.44–1.00)
Chloride: 93 mmol/L — ABNORMAL LOW (ref 101–111)
GFR, EST AFRICAN AMERICAN: 22 mL/min — AB (ref >60)
GFR, EST NON AFRICAN AMERICAN: 19 mL/min — AB (ref >60)
Glucose, Bld: 268 mg/dL — ABNORMAL HIGH (ref 65–99)
Potassium: 3.8 mmol/L (ref 3.5–5.1)
SODIUM: 133 mmol/L — AB (ref 135–145)
Total Bilirubin: 0.7 mg/dL (ref 0.3–1.2)
Total Protein: 5.3 g/dL — ABNORMAL LOW (ref 6.5–8.1)

## 2015-03-29 LAB — CBC
HCT: 39.4 % (ref 36.0–46.0)
Hemoglobin: 12.6 g/dL (ref 12.0–15.0)
MCH: 30.3 pg (ref 26.0–34.0)
MCHC: 32 g/dL (ref 30.0–36.0)
MCV: 94.7 fL (ref 78.0–100.0)
PLATELETS: 182 10*3/uL (ref 150–400)
RBC: 4.16 MIL/uL (ref 3.87–5.11)
RDW: 15.3 % (ref 11.5–15.5)
WBC: 8.3 10*3/uL (ref 4.0–10.5)

## 2015-03-29 MED ORDER — METOPROLOL TARTRATE 50 MG PO TABS
50.0000 mg | ORAL_TABLET | Freq: Two times a day (BID) | ORAL | Status: DC
  Administered 2015-03-29 – 2015-04-02 (×10): 50 mg via ORAL
  Filled 2015-03-29 (×13): qty 1

## 2015-03-29 MED ORDER — LEVOTHYROXINE SODIUM 50 MCG PO TABS
50.0000 ug | ORAL_TABLET | ORAL | Status: DC
  Administered 2015-03-29 – 2015-04-02 (×3): 50 ug via ORAL
  Filled 2015-03-29 (×4): qty 1

## 2015-03-29 MED ORDER — OXYBUTYNIN CHLORIDE ER 10 MG PO TB24
10.0000 mg | ORAL_TABLET | Freq: Every day | ORAL | Status: DC
  Administered 2015-03-29 – 2015-04-02 (×5): 10 mg via ORAL
  Filled 2015-03-29 (×6): qty 1

## 2015-03-29 MED ORDER — POLYETHYLENE GLYCOL 3350 17 G PO PACK
17.0000 g | PACK | Freq: Every day | ORAL | Status: DC | PRN
  Filled 2015-03-29: qty 1

## 2015-03-29 MED ORDER — SODIUM CHLORIDE 0.9 % IV SOLN
INTRAVENOUS | Status: DC
  Administered 2015-03-29: 03:00:00 via INTRAVENOUS

## 2015-03-29 MED ORDER — INSULIN GLARGINE 100 UNIT/ML ~~LOC~~ SOLN
40.0000 [IU] | Freq: Every day | SUBCUTANEOUS | Status: DC
Start: 2015-03-29 — End: 2015-04-02
  Administered 2015-03-29 – 2015-04-01 (×4): 40 [IU] via SUBCUTANEOUS
  Filled 2015-03-29 (×5): qty 0.4

## 2015-03-29 MED ORDER — METHOCARBAMOL 500 MG PO TABS
500.0000 mg | ORAL_TABLET | Freq: Every day | ORAL | Status: DC
  Administered 2015-03-29 – 2015-04-01 (×5): 500 mg via ORAL
  Filled 2015-03-29 (×6): qty 1

## 2015-03-29 MED ORDER — LEVOTHYROXINE SODIUM 25 MCG PO TABS
25.0000 ug | ORAL_TABLET | ORAL | Status: DC
  Administered 2015-03-30 – 2015-04-01 (×2): 25 ug via ORAL
  Filled 2015-03-29 (×2): qty 1

## 2015-03-29 MED ORDER — BUDESONIDE 0.25 MG/2ML IN SUSP
0.2500 mg | Freq: Every day | RESPIRATORY_TRACT | Status: DC
  Administered 2015-03-30 – 2015-04-02 (×4): 0.25 mg via RESPIRATORY_TRACT
  Filled 2015-03-29 (×7): qty 2

## 2015-03-29 MED ORDER — INSULIN GLARGINE 100 UNIT/ML ~~LOC~~ SOLN
25.0000 [IU] | Freq: Every day | SUBCUTANEOUS | Status: DC
  Administered 2015-03-30 – 2015-03-31 (×2): 25 [IU] via SUBCUTANEOUS
  Filled 2015-03-29 (×2): qty 0.25

## 2015-03-29 MED ORDER — ONDANSETRON HCL 4 MG PO TABS: 4.0000 mg | ORAL_TABLET | Freq: Four times a day (QID) | ORAL | Status: DC | PRN

## 2015-03-29 MED ORDER — ONDANSETRON HCL 4 MG/2ML IJ SOLN: 4.0000 mg | Freq: Four times a day (QID) | INTRAMUSCULAR | Status: DC | PRN

## 2015-03-29 MED ORDER — TRAMADOL HCL 50 MG PO TABS: 50.0000 mg | ORAL_TABLET | Freq: Four times a day (QID) | ORAL | Status: DC | PRN

## 2015-03-29 MED ORDER — ACETAMINOPHEN 650 MG RE SUPP: 650.0000 mg | Freq: Four times a day (QID) | RECTAL | Status: DC | PRN

## 2015-03-29 MED ORDER — ALLOPURINOL 100 MG PO TABS
100.0000 mg | ORAL_TABLET | Freq: Every day | ORAL | Status: DC
  Administered 2015-03-29 – 2015-04-02 (×5): 100 mg via ORAL
  Filled 2015-03-29 (×6): qty 1

## 2015-03-29 MED ORDER — APIXABAN 5 MG PO TABS
5.0000 mg | ORAL_TABLET | Freq: Two times a day (BID) | ORAL | Status: DC
  Administered 2015-03-29 – 2015-04-02 (×9): 5 mg via ORAL
  Filled 2015-03-29 (×11): qty 1

## 2015-03-29 MED ORDER — SODIUM CHLORIDE 0.9 % IJ SOLN
3.0000 mL | Freq: Two times a day (BID) | INTRAMUSCULAR | Status: DC
  Administered 2015-03-29 – 2015-04-02 (×8): 3 mL via INTRAVENOUS

## 2015-03-29 MED ORDER — ACETAMINOPHEN 325 MG PO TABS
650.0000 mg | ORAL_TABLET | Freq: Four times a day (QID) | ORAL | Status: DC | PRN
  Administered 2015-03-30 – 2015-04-01 (×4): 650 mg via ORAL
  Filled 2015-03-29 (×4): qty 2

## 2015-03-29 MED ORDER — DILTIAZEM HCL ER COATED BEADS 120 MG PO CP24
120.0000 mg | ORAL_CAPSULE | Freq: Every day | ORAL | Status: DC
  Administered 2015-03-29 – 2015-04-02 (×5): 120 mg via ORAL
  Filled 2015-03-29 (×6): qty 1

## 2015-03-29 MED ORDER — SENNOSIDES-DOCUSATE SODIUM 8.6-50 MG PO TABS
1.0000 | ORAL_TABLET | Freq: Every evening | ORAL | Status: DC | PRN
  Filled 2015-03-29: qty 1

## 2015-03-29 MED ORDER — POLYETHYLENE GLYCOL 3350 17 G PO PACK
17.0000 g | PACK | Freq: Every day | ORAL | Status: DC
  Administered 2015-03-29 – 2015-04-02 (×5): 17 g via ORAL
  Filled 2015-03-29 (×6): qty 1

## 2015-03-29 MED ORDER — ALBUTEROL SULFATE (2.5 MG/3ML) 0.083% IN NEBU
2.5000 mg | INHALATION_SOLUTION | RESPIRATORY_TRACT | Status: DC | PRN
  Administered 2015-03-30 – 2015-03-31 (×3): 2.5 mg via RESPIRATORY_TRACT
  Filled 2015-03-29 (×3): qty 3

## 2015-03-29 MED ORDER — GUAIFENESIN ER 600 MG PO TB12
600.0000 mg | ORAL_TABLET | Freq: Two times a day (BID) | ORAL | Status: DC | PRN
  Administered 2015-03-30 – 2015-04-01 (×4): 600 mg via ORAL
  Filled 2015-03-29 (×10): qty 1

## 2015-03-29 MED ORDER — ISOSORBIDE MONONITRATE ER 30 MG PO TB24
30.0000 mg | ORAL_TABLET | Freq: Every day | ORAL | Status: DC
  Administered 2015-03-29 – 2015-04-02 (×5): 30 mg via ORAL
  Filled 2015-03-29 (×6): qty 1

## 2015-03-29 NOTE — Progress Notes (Signed)
[facility] PROGRESS NOTE  [name] ZOX:[id number] DOB: 1933/09/22 DOA: 03/28/2015 PCP: Cari Caraway, MD  Assessment/Plan: AKI on CKD -baseline creatinine 1.6, on admission was 2.6, suspect due to diureses, was here last week with volume excess and aggressively diuresed 13L negative on discharge -BNP up but mostly unreliable in CKD. CXR and lungs clear -hold lasix, continue Ns at 50c/hr, -monitor bmet/weights   Chronic resp failure on home O2 (OHS/ OSA, poss COPD )/Severe RV dysfunction -stable, continue Home O2 -2L    CAD s/p stent to LAD 2008   - on lopressor  DM 2 uncontrolled -on lantus, SSI, increase dose  Diastolic heart failure, chronic -just discharged after admission for same -as above, lasix held, IVF  Afib  - CHADSVASC 7 - Currently anticoagulated with Eliquis.  - Rate controlled with metoprolol and cardizem  Hx TIA / aphasia - April 2016 -continue eliquis    Hypothyroidism   -synthroid  Code Status: Full Code Family Communication: none at bedside Disposition Plan: home in 1-2days   Consultants:  Cards epnding   HPI/Subjective: Feels ok, no dyspnea,   Objective: Filed Vitals:   03/29/15 0242  BP: 165/80  Pulse: 66  Temp: 97.2 F (36.2 C)  Resp: 18    Intake/Output Summary (Last 24 hours) at 03/29/15 1306 Last data filed at 03/29/15 0900  Gross per 24 hour  Intake    240 ml  Output    401 ml  Net   -161 ml   Filed Weights   03/28/15 1848 03/29/15 0038 03/29/15 0445  Weight: 80.786 kg (178 lb 1.6 oz) 80.332 kg (177 lb 1.6 oz) 80.015 kg (176 lb 6.4 oz)    Exam:   General:  AAOx3  Cardiovascular: S1S2/RRR  Respiratory: CTAB  Abdomen:soft, Nt, BS present  Musculoskeletal:no edema c/c   Data Reviewed: Basic Metabolic Panel:  Recent Labs Lab 03/23/15 0356 03/24/15 0503 03/25/15 0316 03/28/15 1858 03/29/15 0522  NA 138 137 136 131* 133*  K 3.5 3.7 4.2 5.0 3.8  CL 88* 87* 85* 90* 93*  CO2 40* 38*  43* 29 30  GLUCOSE 160* 131* 226* 418* 268*  BUN 66* 67* 72* 82* 73*  CREATININE 1.62* 1.67* 1.97* 2.66* 2.32*  CALCIUM 9.1 9.0 8.7* 8.7* 8.3*   Liver Function Tests:  Recent Labs Lab 03/29/15 0522  AST 25  ALT 43  ALKPHOS 61  BILITOT 0.7  PROT 5.3*  ALBUMIN 2.9*   No results for input(s): LIPASE, AMYLASE in the last 168 hours. No results for input(s): AMMONIA in the last 168 hours. CBC:  Recent Labs Lab 03/28/15 1858 03/29/15 0522  WBC 9.7 8.3  HGB 13.4 12.6  HCT 41.9 39.4  MCV 95.4 94.7  PLT 203 182   Cardiac Enzymes: No results for input(s): CKTOTAL, CKMB, CKMBINDEX, TROPONINI in the last 168 hours. BNP (last 3 results)  Recent Labs  12/05/14 2250 03/16/15 1924 03/28/15 2018  BNP 1143.9* 1109.9* 1496.2*    ProBNP (last 3 results) No results for input(s): PROBNP in the last 8760 hours.  CBG:  Recent Labs Lab 03/25/15 0608 03/25/15 0657 03/25/15 1116 03/29/15 0059 03/29/15 0613  GLUCAP 229* 189* 221* 279* 235*    No results found for this or any previous visit (from the past 240 hour(s)).   Studies: Dg Chest 2 View  03/28/2015   CLINICAL DATA:  Shortness of breath and chest pain. Recent hospital admission for CHF and renal failure.  EXAM: CHEST  2 VIEW  COMPARISON:  03/25/2015  and 09/12/2013  FINDINGS: Lungs are adequately inflated without focal consolidation or effusion. Stable mild cardiomegaly. Calcified plaque over the aortic arch. There are degenerative changes of the spine. Right shoulder prosthesis unchanged. Moderate compression fracture over the lower thoracic spine unchanged. Mild loss of vertebral body height of several mid to upper thoracic vertebrae unchanged.  IMPRESSION: No active cardiopulmonary disease.  Stable moderate compression fracture over the lower thoracic spine with stable loss of height of several mid to upper thoracic vertebral bodies.   Electronically Signed   By: Marin Olp M.D.   On: 03/28/2015 19:33    Scheduled  Meds: . allopurinol  100 mg Oral Daily  . apixaban  5 mg Oral BID  . budesonide  0.25 mg Nebulization Daily  . diltiazem  120 mg Oral Daily  . insulin aspart  0-15 Units Subcutaneous TID WC  . insulin aspart  0-5 Units Subcutaneous QHS  . insulin glargine  20 Units Subcutaneous Q breakfast  . insulin glargine  35 Units Subcutaneous QHS  . isosorbide mononitrate  30 mg Oral Daily  . [START ON 03/30/2015] levothyroxine  25 mcg Oral Once per day on Mon Wed Fri  . levothyroxine  50 mcg Oral Once per day on Sun Tue Thu Sat  . methocarbamol  500 mg Oral QHS  . metoprolol  50 mg Oral BID  . oxybutynin  10 mg Oral Daily  . polyethylene glycol  17 g Oral Daily  . sodium chloride  3 mL Intravenous Q12H   Continuous Infusions: . sodium chloride 50 mL/hr at 03/29/15 0245   Antibiotics Given (last 72 hours)    None      Principal Problem:   Acute on chronic renal failure Active Problems:   Diabetes type 2, uncontrolled   Obstructive sleep apnea   Essential hypertension   Atrial fibrillation   Chronic diastolic heart failure   Chronic anticoagulation   Right ventricular dysfunction   Chronic respiratory failure    Time spent: 84min    Tirsa Gail  [facility] Pager [phone]. If 7PM-7AM, please contact night-coverage at [email or web address], password [facility] 03/29/2015, 1:06 PM

## 2015-03-29 NOTE — Consult Note (Signed)
Advanced Heart Failure Team Consult Note  Referring Physician: Fanny Bien Primary Physician: Primary Cardiologist:  Irish Lack  Reason for Consultation: Diastolic HF, Right heart failure  HPI:    [name] is a 81 y.o. female with history of chronic diastolic HF with RV dysfunction, OSA, COPD, pulmonary hypertension, chronic rate controlled A fib, type 2 diabetes.  She was admitted to [facility] on 03/16/15 when she presented with with worsening SOB, orthopnea, and PND. BNP 1109. CXR showing pulmonary congestion. Underwent 2d echo and right heart catheterization as below. Much improved with diuretics-by day of discharge -13 L balance and weight down to 175 pounds (181 pounds on admission). Discharge weight 175, and diuretic regimen was decreased from Lasix 40 BID to 60 daily.  This admission she received notification that her labs were abnormal and was told to come to the hospital for admission by her PCP.  Labs are significant for AKI with creatinine of 2.6 on admission up from 1.6 - 1.9 baseline.  She denies SOB currently, but has DOE chronically.  Thinks she could make it around the grocery store now, where she had to rest prior to her previous admission.  She denies orthopnea, PND, cough, fever, chills, sick contacts, N/V/D.  She did have some SOB Sunday and Monday, but that has improved.   Currently has no complaints.  Slightly frustrated to be back in the hospital again so soon.   Weight only up 1 lb from recent discharge weight of 175.  [place] 03/21/15 RA = 14 RV = 60/10/14 PA = 65/30 (43) PCW = 20 Fick cardiac output/index = 3.8/2.1 PVR = 6.1 WU Arterial sat = 94% PA sat = 55%, 56%  Echo 03/17/15 EF 60-65%, RV severely dilated with moderately reduced systolic function. PA peak pressure 68 mm Hg.  Review of Systems: [y] = yes, [ ]  = no   General: Weight gain [ ] ; Weight loss [ ] ; Anorexia [ ] ; Fatigue [ ] ; Fever [ ] ; Chills [ ] ; Weakness [ ]   Cardiac: Chest pain/pressure [ ] ; Resting  SOB [ ] ; Exertional SOB [y]; Orthopnea [ ] ; Pedal Edema [ ] ; Palpitations [ ] ; Syncope [ ] ; Presyncope [ ] ; Paroxysmal nocturnal dyspnea[ ]   Pulmonary: Cough [ ] ; Wheezing[ ] ; Hemoptysis[ ] ; Sputum [ ] ; Snoring [ ]   GI: Vomiting[ ] ; Dysphagia[ ] ; Melena[ ] ; Hematochezia [ ] ; Heartburn[ ] ; Abdominal pain [ ] ; Constipation [ ] ; Diarrhea [ ] ; BRBPR [ ]   GU: Hematuria[ ] ; Dysuria [ ] ; Nocturia[ ]   Vascular: Pain in legs with walking [ ] ; Pain in feet with lying flat [ ] ; Non-healing sores [ ] ; Stroke [ ] ; TIA [ ] ; Slurred speech [ ] ;  Neuro: Headaches[ ] ; Vertigo[ ] ; Seizures[ ] ; Paresthesias[ ] ;Blurred vision [ ] ; Diplopia [ ] ; Vision changes [ ]   Ortho/Skin: Arthritis [ ] ; Joint pain [ ] ; Muscle pain [ ] ; Joint swelling [ ] ; Back Pain [ ] ; Rash [ ]   Psych: Depression[ ] ; Anxiety[ ]   Heme: Bleeding problems [ ] ; Clotting disorders [ ] ; Anemia [ ]   Endocrine: Diabetes [y]; Thyroid dysfunction[ ]   Home Medications Prior to Admission medications   Medication Sig Start Date End Date Taking? Authorizing Provider  acetaminophen (TYLENOL) 500 MG tablet Take 1,000 mg by mouth every 6 (six) hours as needed.   Yes Historical Provider, MD  albuterol (PROAIR HFA) 108 (90 BASE) MCG/ACT inhaler 1-2 puffs QID prn Patient taking differently: Inhale 1 puff into the lungs every 4 (four) hours as needed for  shortness of breath.  11/12/13  Yes Deneise Lever, MD  albuterol (PROVENTIL) (2.5 MG/3ML) 0.083% nebulizer solution Take 3 mLs (2.5 mg total) by nebulization every 2 (two) hours as needed for wheezing or shortness of breath. 03/25/15  Yes Shanker Kristeen Mans, MD  allopurinol (ZYLOPRIM) 100 MG tablet Take 1 tablet (100 mg total) by mouth daily. 07/21/13  Yes Samella Parr, NP  apixaban (ELIQUIS) 5 MG TABS tablet Take 1 tablet (5 mg total) by mouth 2 (two) times daily. 01/06/15  Yes Jettie Booze, MD  azelastine (ASTELIN) 137 MCG/SPRAY nasal spray 1-2 sprays each nostril once or twice daily 11/05/13  Yes Clinton D  Young, MD  budesonide (PULMICORT) 0.25 MG/2ML nebulizer solution Take 2 mLs (0.25 mg total) by nebulization daily. 05/13/14  Yes Deneise Lever, MD  colchicine 0.6 MG tablet Take 1.2 mg by mouth daily as needed (for gout flareups).    Yes Historical Provider, MD  diltiazem (CARDIZEM CD) 120 MG 24 hr capsule Take 1 capsule (120 mg total) by mouth daily. 03/25/15  Yes Shanker Kristeen Mans, MD  furosemide (LASIX) 40 MG tablet Take 1.5 tablets (60 mg total) by mouth daily. Contact cardiology office or PCP if you experienced > 3 pounds overnight or > 5 pounds in 1 week for further instructions 03/26/15  Yes Shanker Kristeen Mans, MD  guaiFENesin (MUCINEX) 600 MG 12 hr tablet Take 2 tablets (1,200 mg total) by mouth 2 (two) times daily. 03/25/15  Yes Shanker Kristeen Mans, MD  insulin glargine (LANTUS) 100 UNIT/ML injection Inject 0.2 mLs (20 Units total) into the skin daily. Patient taking differently: Inject 20-35 Units into the skin 2 (two) times daily. Takes 20 units in am and 35 units in pm 07/21/13  Yes Samella Parr, NP  insulin lispro (HUMALOG) 100 UNIT/ML injection Inject 2 Units into the skin 2 (two) times daily with breakfast and lunch. Per sliding scale. Patient unsure about how much she really takes   Yes Historical Provider, MD  isosorbide mononitrate (IMDUR) 30 MG 24 hr tablet 1 tablet by mouth daily Patient taking differently: Take 30 mg by mouth daily. 1 tablet by mouth daily 01/06/15  Yes Jettie Booze, MD  levothyroxine (SYNTHROID, LEVOTHROID) 25 MCG tablet Take 25 mcg by mouth See admin instructions. Take 1 tablet (25 mcg) on Monday, Wednesday, Friday   Yes Historical Provider, MD  levothyroxine (SYNTHROID, LEVOTHROID) 50 MCG tablet Take 50 mcg by mouth See admin instructions. Take 50mg  on Tues, Thurs, Sat and sundays   Yes Historical Provider, MD  methocarbamol (ROBAXIN) 500 MG tablet Take 500 mg by mouth at bedtime.   Yes Historical Provider, MD  metoprolol (LOPRESSOR) 50 MG tablet 1 tablet by  mouth twice a day Patient taking differently: Take 50 mg by mouth 2 (two) times daily. 1 tablet by mouth twice a day 01/06/15  Yes Jettie Booze, MD  nitroGLYCERIN (NITROSTAT) 0.4 MG SL tablet Place 0.4 mg under the tongue every 5 (five) minutes as needed for chest pain.    Yes Historical Provider, MD  oxybutynin (DITROPAN XL) 10 MG 24 hr tablet Take 10 mg by mouth daily.    Yes Historical Provider, MD  polyethylene glycol (MIRALAX / GLYCOLAX) packet Take 17 g by mouth daily. 03/25/15  Yes Shanker Kristeen Mans, MD  predniSONE (DELTASONE) 10 MG tablet Take 3 tablets (30 mg) daily for 2 days, then, Take 2 tablets (20 mg) daily for 2 days, then, Take 1 tablets (10 mg) daily  for 1 days, then stop 03/25/15  Yes Shanker Kristeen Mans, MD  senna-docusate (SENOKOT-S) 8.6-50 MG per tablet Take 1 tablet by mouth at bedtime as needed for mild constipation. 03/25/15  Yes Shanker Kristeen Mans, MD  traMADol (ULTRAM) 50 MG tablet Take 50 mg by mouth every 6 (six) hours as needed for moderate pain.    Yes Historical Provider, MD  levofloxacin (LEVAQUIN) 250 MG tablet Take 1 tablet (250 mg total) by mouth daily. Patient not taking: Reported on 03/28/2015 03/25/15   Jonetta Osgood, MD    Past Medical History: Past Medical History  Diagnosis Date  . CAD (coronary artery disease)   . CHF (congestive heart failure)   . Paroxysmal atrial fibrillation   . Bradycardia   . Hypertension   . Renal artery stenosis   . COPD (chronic obstructive pulmonary disease)   . Obesity hypoventilation syndrome   . Pruritus   . Tracheobronchitis   . Acute rhinosinusitis   . Anemia   . Hypoxemia   . Peripheral edema   . Obesity, endogenous   . Diabetes mellitus   . Acid reflux disease   . Osteoarthritis, generalized   . Hypothyroidism   . Lung nodule     lingula  . OSA (obstructive sleep apnea)     NPSG 04/03/00--AHI 28.hr  . DVT (deep venous thrombosis) 2003    left leg  . Vaginal atrophy   . Hx of colonic polyps   .  Acute diastolic heart failure   . Secondary pulmonary hypertension 06/09/2013  . Blood transfusion without reported diagnosis 2009    had internal bleeding  . Cancer 1959    ovarian cancer  . Osteoporosis     Past Surgical History: Past Surgical History  Procedure Laterality Date  . Lumbar spine surgery    . Cholecystectomy    . Hemorroidectomy    . Repair wound fistula    . Right shoulder    . Elbow surgery    . Dilation and curettage of uterus    . Breast surgery  1998    lumpectomy  . Oophorectomy  1959    BSO-? ovarian cancer  . Bilateral salpingoophorectomy  1959    -d/t ovarian cancer  . Rotator cuff repair Right   . Cardiac catheterization N/A 03/21/2015    Procedure: Right Heart Cath;  Surgeon: Jolaine Artist, MD;  Location: [facility];  Service: Cardiovascular;  Laterality: N/A;    Family History: Family History  Problem Relation Age of Onset  . Pneumonia Mother   . Heart disease Mother   . Allergies Mother   . Emphysema Mother   . Arthritis Mother   . Hypertension Mother   . Heart attack Father   . Hypertension Father   . Diabetes Sister   . Heart disease Sister   . Diabetes Brother   . Diabetes      grandfather  . Breast cancer      aunt  . Bone cancer      aunt  . Clotting disorder Brother   . Arthritis Sister   . Breast cancer Maternal Aunt     Social History: Social History   Social History  . Marital Status: Married    Spouse Name: N/A  . Number of Children: 2  . Years of Education: N/A   Occupational History  . retired     Social History Main Topics  . Smoking status: Former Smoker    Quit date: 08/13/1978  .  Smokeless tobacco: Never Used  . Alcohol Use: No  . Drug Use: No  . Sexual Activity: No   Other Topics Concern  . None   Social History Narrative   Lives with husband       Allergies:  Allergies  Allergen Reactions  . Cefuroxime Axetil Shortness Of Breath, Swelling and Rash    Throat swelling  .  Cephalexin Shortness Of Breath, Swelling and Rash    Throat swelling  . Clarithromycin Shortness Of Breath, Swelling and Rash    Throat swelling  . Doxycycline Shortness Of Breath, Swelling and Rash     rash on legs and feet, throat swelling  . Erythromycin Shortness Of Breath, Swelling and Rash    Throat swelling  . Tetracycline Shortness Of Breath, Swelling and Rash    Throat swelling    Objective:    Vital Signs:   Temp:  [97.2 F (36.2 C)-98.8 F (37.1 C)] 97.2 F (36.2 C) (08/16 0242) Pulse Rate:  [53-108] 66 (08/16 0242) Resp:  [15-24] 18 (08/16 0242) BP: (125-185)/(66-170) 165/80 mmHg (08/16 0242) SpO2:  [92 %-98 %] 92 % (08/16 0242) Weight:  [176 lb 6.4 oz (80.015 kg)-178 lb 1.6 oz (80.786 kg)] 176 lb 6.4 oz (80.015 kg) (08/16 0445) Last BM Date: 03/28/15  Weight change: Filed Weights   03/28/15 1848 03/29/15 0038 03/29/15 0445  Weight: 178 lb 1.6 oz (80.786 kg) 177 lb 1.6 oz (80.332 kg) 176 lb 6.4 oz (80.015 kg)    Intake/Output:   Intake/Output Summary (Last 24 hours) at 03/29/15 1109 Last data filed at 03/29/15 0900  Gross per 24 hour  Intake    240 ml  Output    401 ml  Net   -161 ml     Physical Exam: General:  Elderly chronically-ill  appearing. No resp difficulty HEENT: normal. 02 via [place]. Neck: supple. JVP jaw . Carotids 2+ bilat; no bruits. No lymphadenopathy or thryomegaly appreciated. Cor: PMI nondisplaced. Irregularly irregular rate & rhythm. No rubs, gallops or murmurs. Lungs: clear Abdomen: soft, nontender, nondistended. No hepatosplenomegaly. No bruits or masses. +BS. Extremities: no cyanosis, clubbing, rash, tr-1+ edema Neuro: alert & orientedx3, cranial nerves grossly intact. moves all 4 extremities w/o difficulty. Affect pleasant  Telemetry:  Afib 80s  Labs: Basic Metabolic Panel:  Recent Labs Lab 03/23/15 0356 03/24/15 0503 03/25/15 0316 03/28/15 1858 03/29/15 0522  NA 138 137 136 131* 133*  K 3.5 3.7 4.2 5.0 3.8  CL 88* 87*  85* 90* 93*  CO2 40* 38* 43* 29 30  GLUCOSE 160* 131* 226* 418* 268*  BUN 66* 67* 72* 82* 73*  CREATININE 1.62* 1.67* 1.97* 2.66* 2.32*  CALCIUM 9.1 9.0 8.7* 8.7* 8.3*    Liver Function Tests:  Recent Labs Lab 03/29/15 0522  AST 25  ALT 43  ALKPHOS 61  BILITOT 0.7  PROT 5.3*  ALBUMIN 2.9*   No results for input(s): LIPASE, AMYLASE in the last 168 hours. No results for input(s): AMMONIA in the last 168 hours.  CBC:  Recent Labs Lab 03/28/15 1858 03/29/15 0522  WBC 9.7 8.3  HGB 13.4 12.6  HCT 41.9 39.4  MCV 95.4 94.7  PLT 203 182    Cardiac Enzymes: No results for input(s): CKTOTAL, CKMB, CKMBINDEX, TROPONINI in the last 168 hours.  BNP: BNP (last 3 results)  Recent Labs  12/05/14 2250 03/16/15 1924 03/28/15 2018  BNP 1143.9* 1109.9* 1496.2*    ProBNP (last 3 results) No results for input(s): PROBNP in the last  8760 hours.   CBG:  Recent Labs Lab 03/25/15 0608 03/25/15 0657 03/25/15 1116 03/29/15 0059 03/29/15 0613  GLUCAP 229* 189* 221* 279* 235*    Coagulation Studies: No results for input(s): LABPROT, INR in the last 72 hours.  Other results: EKG: Afib 86, rightward axis  Imaging: Dg Chest 2 View  03/28/2015   CLINICAL DATA:  Shortness of breath and chest pain. Recent hospital admission for CHF and renal failure.  EXAM: CHEST  2 VIEW  COMPARISON:  03/25/2015 and 09/12/2013  FINDINGS: Lungs are adequately inflated without focal consolidation or effusion. Stable mild cardiomegaly. Calcified plaque over the aortic arch. There are degenerative changes of the spine. Right shoulder prosthesis unchanged. Moderate compression fracture over the lower thoracic spine unchanged. Mild loss of vertebral body height of several mid to upper thoracic vertebrae unchanged.  IMPRESSION: No active cardiopulmonary disease.  Stable moderate compression fracture over the lower thoracic spine with stable loss of height of several mid to upper thoracic vertebral  bodies.   Electronically Signed   By: Marin Olp M.D.   On: 03/28/2015 19:33      Medications:     Current Medications: . allopurinol  100 mg Oral Daily  . apixaban  5 mg Oral BID  . budesonide  0.25 mg Nebulization Daily  . diltiazem  120 mg Oral Daily  . insulin aspart  0-15 Units Subcutaneous TID WC  . insulin aspart  0-5 Units Subcutaneous QHS  . insulin glargine  20 Units Subcutaneous Q breakfast  . insulin glargine  35 Units Subcutaneous QHS  . isosorbide mononitrate  30 mg Oral Daily  . [START ON 03/30/2015] levothyroxine  25 mcg Oral Once per day on Mon Wed Fri  . levothyroxine  50 mcg Oral Once per day on Sun Tue Thu Sat  . methocarbamol  500 mg Oral QHS  . metoprolol  50 mg Oral BID  . oxybutynin  10 mg Oral Daily  . polyethylene glycol  17 g Oral Daily  . sodium chloride  3 mL Intravenous Q12H     Infusions: . sodium chloride 50 mL/hr at 03/29/15 0245      Assessment/Plan   1. Acute on chronic diastolic CHF/right heart failure : Echo 03/17/15 EF 60-65%, RV severely dilated with moderately reduced systolic function. 2. Pulmonary Hypertension - PA peak pressure 68 mm Hg last echo. [place] PA 65/30 (43) 3. COPD - On chronic 02 4. Paroxysmal atrial fibrillation, on Eliquis PTA - Rate controlled on metoprolol and cardizem 5. H/o CAD s/p stenting of LAD 2008. 6. AKI on CKD stage III 7. Diabetes mellitus 8. OSA   Cr 2.66, from 1.6 baseline last admission. On BB and imdur.  No ACEi/ARB in CKD.  K 3.8.  Will watch closely with holding and eventual resumption of diuresis.  Will watch fluid status closely.  D/c continuous IV fluid.  Worried she will quickly progress to fluid overload if not done gradually.  Length of Stay:  1  Shirley Friar PA-C 03/29/2015, 11:09 AM  Advanced Heart Failure Team Pager [phone] (M-F; 7a - 4p)  Please contact [facility] for night-coverage after hours (4p -7a ) and weekends on [email or web address]  Patient seen and examined  with Oda Kilts, PA-C. We discussed all aspects of the encounter. I agree with the assessment and plan as stated above.   Very difficult situation. I suspect she has restrictive physiology with a very narrow euvolemic window and thus will be hard to keep her dry  without her creatinine bumping a bit. I think we will need to tolerate some permissive azotemia to be able to treat her HF and pulmonary HTN. Her neck veins remain elevated and thus would stop IVF for now and let her renal function equilibrate. Likely restart diuretics in the am. Wth PVR of 6.1 may consider careful trial of PDE-5 inhibitor. Will need to continue O2 and will likely need pulmonary rehab on d/c.   We will follow. Continue apixaban for AF.   Bensimhon, Daniel,MD 1:51 PM

## 2015-03-30 ENCOUNTER — Other Ambulatory Visit: Payer: Medicare Other

## 2015-03-30 DIAGNOSIS — Z7901 Long term (current) use of anticoagulants: Secondary | ICD-10-CM

## 2015-03-30 DIAGNOSIS — I5032 Chronic diastolic (congestive) heart failure: Secondary | ICD-10-CM | POA: Diagnosis not present

## 2015-03-30 DIAGNOSIS — J9611 Chronic respiratory failure with hypoxia: Secondary | ICD-10-CM | POA: Diagnosis not present

## 2015-03-30 DIAGNOSIS — N189 Chronic kidney disease, unspecified: Secondary | ICD-10-CM | POA: Diagnosis not present

## 2015-03-30 DIAGNOSIS — I509 Heart failure, unspecified: Secondary | ICD-10-CM | POA: Diagnosis not present

## 2015-03-30 DIAGNOSIS — E1165 Type 2 diabetes mellitus with hyperglycemia: Secondary | ICD-10-CM

## 2015-03-30 DIAGNOSIS — N179 Acute kidney failure, unspecified: Secondary | ICD-10-CM | POA: Diagnosis not present

## 2015-03-30 LAB — CBC
HEMATOCRIT: 42.4 % (ref 36.0–46.0)
HEMOGLOBIN: 13 g/dL (ref 12.0–15.0)
MCH: 29.5 pg (ref 26.0–34.0)
MCHC: 30.7 g/dL (ref 30.0–36.0)
MCV: 96.4 fL (ref 78.0–100.0)
Platelets: 181 10*3/uL (ref 150–400)
RBC: 4.4 MIL/uL (ref 3.87–5.11)
RDW: 15.6 % — AB (ref 11.5–15.5)
WBC: 9.3 10*3/uL (ref 4.0–10.5)

## 2015-03-30 LAB — GLUCOSE, CAPILLARY
GLUCOSE-CAPILLARY: 223 mg/dL — AB (ref 65–99)
GLUCOSE-CAPILLARY: 355 mg/dL — AB (ref 65–99)
Glucose-Capillary: 136 mg/dL — ABNORMAL HIGH (ref 65–99)
Glucose-Capillary: 195 mg/dL — ABNORMAL HIGH (ref 65–99)

## 2015-03-30 LAB — BASIC METABOLIC PANEL
ANION GAP: 7 (ref 5–15)
BUN: 59 mg/dL — ABNORMAL HIGH (ref 6–20)
CALCIUM: 8.5 mg/dL — AB (ref 8.9–10.3)
CO2: 32 mmol/L (ref 22–32)
Chloride: 95 mmol/L — ABNORMAL LOW (ref 101–111)
Creatinine, Ser: 1.89 mg/dL — ABNORMAL HIGH (ref 0.44–1.00)
GFR, EST AFRICAN AMERICAN: 28 mL/min — AB (ref >60)
GFR, EST NON AFRICAN AMERICAN: 24 mL/min — AB (ref >60)
Glucose, Bld: 129 mg/dL — ABNORMAL HIGH (ref 65–99)
POTASSIUM: 4.4 mmol/L (ref 3.5–5.1)
SODIUM: 134 mmol/L — AB (ref 135–145)

## 2015-03-30 LAB — TSH: TSH: 2.951 u[IU]/mL (ref 0.350–4.500)

## 2015-03-30 MED ORDER — FUROSEMIDE 40 MG PO TABS
40.0000 mg | ORAL_TABLET | Freq: Every day | ORAL | Status: DC
  Administered 2015-03-31: 40 mg via ORAL
  Filled 2015-03-30 (×2): qty 1

## 2015-03-30 MED ORDER — PREDNISONE 20 MG PO TABS
20.0000 mg | ORAL_TABLET | Freq: Every day | ORAL | Status: DC
  Administered 2015-03-30 – 2015-04-02 (×4): 20 mg via ORAL
  Filled 2015-03-30 (×5): qty 1

## 2015-03-30 NOTE — Progress Notes (Addendum)
[facility] PROGRESS NOTE  Filed Weights   03/29/15 0038 03/29/15 0445 03/30/15 0546  Weight: 80.332 kg (177 lb 1.6 oz) 80.015 kg (176 lb 6.4 oz) 80.564 kg (177 lb 9.8 oz)        Intake/Output Summary (Last 24 hours) at 03/30/15 0958 Last data filed at 03/30/15 0900  Gross per 24 hour  Intake    720 ml  Output    751 ml  Net    -31 ml     Assessment/Plan: Acute on chronic renal failure: Baseline creatinine around 1.6. On admission it was 2.3 was started on gentle IV fluid hydration and 1.8. KVO IV fluids check a basic metabolic panel in the morning. He will need to go back on previous dose Lasix of 40 mg twice a day. Will discuss with advanced heart failure team. She only receives 50 mL an hour of normal saline overnight and creatinine still mildly elevated. Check orthostatic vitals, as she relates dizziness upon standing and still with generalized weakness.  Chronic rest.  failure on home oxygen/obstructive sleep apnea/obesity hypoventilation syndrome/severe RV dysfunction: - Continue home oxygen.  Coronary artery disease status post stents to the LAD in 2008: Currently chest pain-free asymptomatic continue metoprolol.  Uncontrolled diabetes mellitus type 2: Continue long-acting insulin plus sliding scale as blood glucose slowly trending down.  Chronic diastolic heart failure: Seems to be compensated.  Chronic atrial fibrillation: CHADSVASC 7 Currently anticoagulated with Eliquis.  Rate controlled with metoprolol and cardizem  Code Status: Full Code Family Communication: none at bedside Disposition Plan: home in 1-2days   Consultants:  Cards  Antibiotics:  none  HPI/Subjective: She she still feels weak and mildly lightheaded with standing  Objective: Filed Vitals:   03/29/15 1334 03/29/15 2135 03/30/15 0546 03/30/15 0721  BP: 130/73 131/62 131/64   Pulse: 88 66 59   Temp: 97.8 F (36.6 C) 97.8 F (36.6 C) 97.8 F (36.6 C)   TempSrc: Oral  Oral Oral   Resp: 17 18 18    Height:      Weight:   80.564 kg (177 lb 9.8 oz)   SpO2: 99% 96% 98% 92%     Exam:  General: Alert, awake, oriented x3, in no acute distress.  HEENT: No bruits, no goiter.  Heart: Regular rate and rhythm. Lungs: Good air movement, clear Abdomen: Soft, nontender, nondistended, positive bowel sounds.  Neuro: Grossly intact, nonfocal.   Data Reviewed: Basic Metabolic Panel:  Recent Labs Lab 03/24/15 0503 03/25/15 0316 03/28/15 1858 03/29/15 0522 03/30/15 0447  NA 137 136 131* 133* 134*  K 3.7 4.2 5.0 3.8 4.4  CL 87* 85* 90* 93* 95*  CO2 38* 43* 29 30 32  GLUCOSE 131* 226* 418* 268* 129*  BUN 67* 72* 82* 73* 59*  CREATININE 1.67* 1.97* 2.66* 2.32* 1.89*  CALCIUM 9.0 8.7* 8.7* 8.3* 8.5*   Liver Function Tests:  Recent Labs Lab 03/29/15 0522  AST 25  ALT 43  ALKPHOS 61  BILITOT 0.7  PROT 5.3*  ALBUMIN 2.9*   No results for input(s): LIPASE, AMYLASE in the last 168 hours. No results for input(s): AMMONIA in the last 168 hours. CBC:  Recent Labs Lab 03/28/15 1858 03/29/15 0522 03/30/15 0447  WBC 9.7 8.3 9.3  HGB 13.4 12.6 13.0  HCT 41.9 39.4 42.4  MCV 95.4 94.7 96.4  PLT 203 182 181   Cardiac Enzymes: No results for input(s): CKTOTAL, CKMB, CKMBINDEX, TROPONINI in the last 168 hours. BNP (last 3 results)  Recent  Labs  12/05/14 2250 03/16/15 1924 03/28/15 2018  BNP 1143.9* 1109.9* 1496.2*    ProBNP (last 3 results) No results for input(s): PROBNP in the last 8760 hours.  CBG:  Recent Labs Lab 03/29/15 0613 03/29/15 1123 03/29/15 1618 03/29/15 2122 03/30/15 0519  GLUCAP 235* 221* 128* 294* 136*    No results found for this or any previous visit (from the past 240 hour(s)).   Studies: Dg Chest 2 View  03/28/2015   CLINICAL DATA:  Shortness of breath and chest pain. Recent hospital admission for CHF and renal failure.  EXAM: CHEST  2 VIEW  COMPARISON:  03/25/2015 and 09/12/2013  FINDINGS: Lungs are  adequately inflated without focal consolidation or effusion. Stable mild cardiomegaly. Calcified plaque over the aortic arch. There are degenerative changes of the spine. Right shoulder prosthesis unchanged. Moderate compression fracture over the lower thoracic spine unchanged. Mild loss of vertebral body height of several mid to upper thoracic vertebrae unchanged.  IMPRESSION: No active cardiopulmonary disease.  Stable moderate compression fracture over the lower thoracic spine with stable loss of height of several mid to upper thoracic vertebral bodies.   Electronically Signed   By: Marin Olp M.D.   On: 03/28/2015 19:33    Scheduled Meds: . allopurinol  100 mg Oral Daily  . apixaban  5 mg Oral BID  . budesonide  0.25 mg Nebulization Daily  . diltiazem  120 mg Oral Daily  . furosemide  40 mg Oral Daily  . insulin aspart  0-15 Units Subcutaneous TID WC  . insulin aspart  0-5 Units Subcutaneous QHS  . insulin glargine  25 Units Subcutaneous Q breakfast  . insulin glargine  40 Units Subcutaneous QHS  . isosorbide mononitrate  30 mg Oral Daily  . levothyroxine  25 mcg Oral Once per day on Mon Wed Fri  . levothyroxine  50 mcg Oral Once per day on Sun Tue Thu Sat  . methocarbamol  500 mg Oral QHS  . metoprolol  50 mg Oral BID  . oxybutynin  10 mg Oral Daily  . polyethylene glycol  17 g Oral Daily  . sodium chloride  3 mL Intravenous Q12H   Continuous Infusions:    Charlynne Cousins  [facility] Pager [phone] if 7PM-7AM, please contact night-coverage at [email or web address], password [facility] 03/30/2015, 9:58 AM

## 2015-03-30 NOTE — Discharge Instructions (Addendum)
Monitor weight: Contact cardiology office or PCP if you experienced > 3 pounds overnight or > 5 pounds in 1 week for further instructions      Information on my medicine - ELIQUIS (apixaban)  This medication education was reviewed with me or my healthcare representative as part of my discharge preparation.  The pharmacist that spoke with me during my hospital stay was:  Wayland Salinas, [facility]  Why was Eliquis prescribed for you? Eliquis was prescribed for you to reduce the risk of a blood clot forming that can cause a stroke if you have a medical condition called atrial fibrillation (a type of irregular heartbeat).  What do You need to know about Eliquis ? Take your Eliquis TWICE DAILY - one tablet in the morning and one tablet in the evening with or without food. If you have difficulty swallowing the tablet whole please discuss with your pharmacist how to take the medication safely.  Take Eliquis exactly as prescribed by your doctor and DO NOT stop taking Eliquis without talking to the doctor who prescribed the medication.  Stopping may increase your risk of developing a stroke.  Refill your prescription before you run out.  After discharge, you should have regular check-up appointments with your healthcare provider that is prescribing your Eliquis.  In the future your dose may need to be changed if your kidney function or weight changes by a significant amount or as you get older.  What do you do if you miss a dose? If you miss a dose, take it as soon as you remember on the same day and resume taking twice daily.  Do not take more than one dose of ELIQUIS at the same time to make up a missed dose.  Important Safety Information A possible side effect of Eliquis is bleeding. You should call your healthcare provider right away if you experience any of the following: ? Bleeding from an injury or your nose that does not stop. ? Unusual colored urine (red or dark brown) or  unusual colored stools (red or black). ? Unusual bruising for unknown reasons. ? A serious fall or if you hit your head (even if there is no bleeding).  Some medicines may interact with Eliquis and might increase your risk of bleeding or clotting while on Eliquis. To help avoid this, consult your healthcare provider or pharmacist prior to using any new prescription or non-prescription medications, including herbals, vitamins, non-steroidal anti-inflammatory drugs (NSAIDs) and supplements.  This website has more information on Eliquis (apixaban): http://www.eliquis.com/eliquis/home

## 2015-03-30 NOTE — Progress Notes (Signed)
Advanced Heart Failure Rounding Note   Primary Cardiologist: Irish Lack Primary HF: [name]  Subjective:    Feeling ok today.  A little tired. Had some SOB that improved with breathing treatments.  No CP.  Slightly lightheaded going sitting to standing.  I/O even yesterday with holding of diuresis and stopping of continuous IV fluid. Cr improved 2.32 -> 1.89. Baseline appears to be 1.4-1.6.  Objective:   Weight Range: 177 lb 9.8 oz (80.564 kg) Body mass index is 34.42 kg/(m^2).   Vital Signs:   Temp:  [97.8 F (36.6 C)] 97.8 F (36.6 C) (08/17 0546) Pulse Rate:  [59-88] 59 (08/17 0546) Resp:  [17-18] 18 (08/17 0546) BP: (130-131)/(62-73) 131/64 mmHg (08/17 0546) SpO2:  [92 %-99 %] 92 % (08/17 0721) Weight:  [177 lb 9.8 oz (80.564 kg)] 177 lb 9.8 oz (80.564 kg) (08/17 0546) Last BM Date: 03/29/15  Weight change: Filed Weights   03/29/15 0038 03/29/15 0445 03/30/15 0546  Weight: 177 lb 1.6 oz (80.332 kg) 176 lb 6.4 oz (80.015 kg) 177 lb 9.8 oz (80.564 kg)    Intake/Output:   Intake/Output Summary (Last 24 hours) at 03/30/15 0918 Last data filed at 03/30/15 0900  Gross per 24 hour  Intake    720 ml  Output    751 ml  Net    -31 ml     Physical Exam: General: Elderly chronically-ill appearing. NAD HEENT: Normocephalic, atraumatic. 02 via [place].  Neck: supple. JVP jaw . Carotids 2+ bilat; no bruits. No lymphadenopathy or thryomegaly noted. Cor: PMI nondisplaced. Irregularly irregular rate & rhythm. No rubs, gallops or murmurs appreciated Lungs: CTA Abdomen: soft, NT, ND. No HSM. No bruits or masses. Good bowel sounds. Extremities: no cyanosis, clubbing, rash, tr-1+ edema Neuro: alert & orientedx3, cranial nerves grossly intact. moves all 4 extremities w/o difficulty. Affect pleasant   Telemetry: Afib 70-80s  Labs: CBC  Recent Labs  03/29/15 0522 03/30/15 0447  WBC 8.3 9.3  HGB 12.6 13.0  HCT 39.4 42.4  MCV 94.7 96.4  PLT 182 093   Basic Metabolic  Panel  Recent Labs  03/29/15 0522 03/30/15 0447  NA 133* 134*  K 3.8 4.4  CL 93* 95*  CO2 30 32  GLUCOSE 268* 129*  BUN 73* 59*  CALCIUM 8.3* 8.5*   Liver Function Tests  Recent Labs  03/29/15 0522  AST 25  ALT 43  ALKPHOS 61  BILITOT 0.7  PROT 5.3*  ALBUMIN 2.9*   No results for input(s): LIPASE, AMYLASE in the last 72 hours. Cardiac Enzymes No results for input(s): CKTOTAL, CKMB, CKMBINDEX, TROPONINI in the last 72 hours.  BNP: BNP (last 3 results)  Recent Labs  12/05/14 2250 03/16/15 1924 03/28/15 2018  BNP 1143.9* 1109.9* 1496.2*    ProBNP (last 3 results) No results for input(s): PROBNP in the last 8760 hours.   D-Dimer No results for input(s): DDIMER in the last 72 hours. Hemoglobin A1C No results for input(s): HGBA1C in the last 72 hours. Fasting Lipid Panel No results for input(s): CHOL, HDL, LDLCALC, TRIG, CHOLHDL, LDLDIRECT in the last 72 hours. Thyroid Function Tests  Recent Labs  03/30/15 0447  TSH 2.951    Other results:     Imaging/Studies:  Dg Chest 2 View  03/28/2015   CLINICAL DATA:  Shortness of breath and chest pain. Recent hospital admission for CHF and renal failure.  EXAM: CHEST  2 VIEW  COMPARISON:  03/25/2015 and 09/12/2013  FINDINGS: Lungs are adequately inflated without focal consolidation  or effusion. Stable mild cardiomegaly. Calcified plaque over the aortic arch. There are degenerative changes of the spine. Right shoulder prosthesis unchanged. Moderate compression fracture over the lower thoracic spine unchanged. Mild loss of vertebral body height of several mid to upper thoracic vertebrae unchanged.  IMPRESSION: No active cardiopulmonary disease.  Stable moderate compression fracture over the lower thoracic spine with stable loss of height of several mid to upper thoracic vertebral bodies.   Electronically Signed   By: Marin Olp M.D.   On: 03/28/2015 19:33     Latest Echo  Latest Cath   Medications:      Scheduled Medications: . allopurinol  100 mg Oral Daily  . apixaban  5 mg Oral BID  . budesonide  0.25 mg Nebulization Daily  . diltiazem  120 mg Oral Daily  . insulin aspart  0-15 Units Subcutaneous TID WC  . insulin aspart  0-5 Units Subcutaneous QHS  . insulin glargine  25 Units Subcutaneous Q breakfast  . insulin glargine  40 Units Subcutaneous QHS  . isosorbide mononitrate  30 mg Oral Daily  . levothyroxine  25 mcg Oral Once per day on Mon Wed Fri  . levothyroxine  50 mcg Oral Once per day on Sun Tue Thu Sat  . methocarbamol  500 mg Oral QHS  . metoprolol  50 mg Oral BID  . oxybutynin  10 mg Oral Daily  . polyethylene glycol  17 g Oral Daily  . sodium chloride  3 mL Intravenous Q12H     Infusions:     PRN Medications:  acetaminophen **OR** acetaminophen, albuterol, guaiFENesin, ondansetron **OR** ondansetron (ZOFRAN) IV, polyethylene glycol, senna-docusate, traMADol   Assessment/Plan   1. Acute on chronic diastolic CHF/right heart failure : Echo 03/17/15 EF 60-65%, RV severely dilated with moderately reduced systolic function. 2. Pulmonary Hypertension - PA peak pressure 68 mm Hg last echo. [place] PA 65/30 (43) 3. COPD - On chronic 02 4. Paroxysmal atrial fibrillation, on Eliquis PTA - Rate controlled on metoprolol and cardizem 5. H/o CAD s/p stenting of LAD 2008. 6. AKI on CKD stage III 7. Diabetes mellitus 8. OSA  Cr improved. Baseline appears to be 1.4-1.6.  Fluid status appears elevated by exam.  K improved. Continue to supp as needed.  With fluid overload will start back on low dose lasix 40 mg po today to see how she tolerates.  Length of Stay:      Shirley Friar PA-C 03/30/2015, 9:18 AM  Advanced Heart Failure Team Pager [phone] (M-F; 7a - 4p)  Please contact [facility] for night-coverage after hours (4p -7a ) and weekends on [email or web address]   Patient seen and examined with Oda Kilts, PA-C. We discussed all aspects of the encounter. I  agree with the assessment and plan as stated above.   Renal function better. Agree with restarting diuretics. She has very narrow euvolemic window. Can consider careful trial of sildenafil to help with PAH if she can afford but need to watch for pulmonary edema.   Giankarlo Leamer,MD 11:11 PM

## 2015-03-31 DIAGNOSIS — Z7951 Long term (current) use of inhaled steroids: Secondary | ICD-10-CM | POA: Diagnosis not present

## 2015-03-31 DIAGNOSIS — Z79899 Other long term (current) drug therapy: Secondary | ICD-10-CM | POA: Diagnosis not present

## 2015-03-31 DIAGNOSIS — J441 Chronic obstructive pulmonary disease with (acute) exacerbation: Secondary | ICD-10-CM | POA: Diagnosis present

## 2015-03-31 DIAGNOSIS — Z8249 Family history of ischemic heart disease and other diseases of the circulatory system: Secondary | ICD-10-CM | POA: Diagnosis not present

## 2015-03-31 DIAGNOSIS — E662 Morbid (severe) obesity with alveolar hypoventilation: Secondary | ICD-10-CM | POA: Diagnosis present

## 2015-03-31 DIAGNOSIS — E1122 Type 2 diabetes mellitus with diabetic chronic kidney disease: Secondary | ICD-10-CM | POA: Diagnosis present

## 2015-03-31 DIAGNOSIS — Z833 Family history of diabetes mellitus: Secondary | ICD-10-CM | POA: Diagnosis not present

## 2015-03-31 DIAGNOSIS — N184 Chronic kidney disease, stage 4 (severe): Secondary | ICD-10-CM | POA: Diagnosis present

## 2015-03-31 DIAGNOSIS — Z9981 Dependence on supplemental oxygen: Secondary | ICD-10-CM | POA: Diagnosis not present

## 2015-03-31 DIAGNOSIS — K219 Gastro-esophageal reflux disease without esophagitis: Secondary | ICD-10-CM | POA: Diagnosis present

## 2015-03-31 DIAGNOSIS — I5032 Chronic diastolic (congestive) heart failure: Secondary | ICD-10-CM | POA: Diagnosis not present

## 2015-03-31 DIAGNOSIS — I130 Hypertensive heart and chronic kidney disease with heart failure and stage 1 through stage 4 chronic kidney disease, or unspecified chronic kidney disease: Secondary | ICD-10-CM | POA: Diagnosis present

## 2015-03-31 DIAGNOSIS — E039 Hypothyroidism, unspecified: Secondary | ICD-10-CM | POA: Diagnosis present

## 2015-03-31 DIAGNOSIS — I10 Essential (primary) hypertension: Secondary | ICD-10-CM | POA: Diagnosis not present

## 2015-03-31 DIAGNOSIS — J9621 Acute and chronic respiratory failure with hypoxia: Secondary | ICD-10-CM | POA: Diagnosis present

## 2015-03-31 DIAGNOSIS — M810 Age-related osteoporosis without current pathological fracture: Secondary | ICD-10-CM | POA: Diagnosis present

## 2015-03-31 DIAGNOSIS — I272 Other secondary pulmonary hypertension: Secondary | ICD-10-CM | POA: Diagnosis present

## 2015-03-31 DIAGNOSIS — I5033 Acute on chronic diastolic (congestive) heart failure: Secondary | ICD-10-CM | POA: Diagnosis present

## 2015-03-31 DIAGNOSIS — Z825 Family history of asthma and other chronic lower respiratory diseases: Secondary | ICD-10-CM | POA: Diagnosis not present

## 2015-03-31 DIAGNOSIS — E1165 Type 2 diabetes mellitus with hyperglycemia: Secondary | ICD-10-CM | POA: Diagnosis present

## 2015-03-31 DIAGNOSIS — Z794 Long term (current) use of insulin: Secondary | ICD-10-CM | POA: Diagnosis not present

## 2015-03-31 DIAGNOSIS — Z6835 Body mass index (BMI) 35.0-35.9, adult: Secondary | ICD-10-CM | POA: Diagnosis not present

## 2015-03-31 DIAGNOSIS — I480 Paroxysmal atrial fibrillation: Secondary | ICD-10-CM | POA: Diagnosis present

## 2015-03-31 DIAGNOSIS — I482 Chronic atrial fibrillation: Secondary | ICD-10-CM | POA: Diagnosis present

## 2015-03-31 DIAGNOSIS — M1990 Unspecified osteoarthritis, unspecified site: Secondary | ICD-10-CM | POA: Diagnosis present

## 2015-03-31 DIAGNOSIS — Z955 Presence of coronary angioplasty implant and graft: Secondary | ICD-10-CM | POA: Diagnosis not present

## 2015-03-31 DIAGNOSIS — I69320 Aphasia following cerebral infarction: Secondary | ICD-10-CM | POA: Diagnosis not present

## 2015-03-31 DIAGNOSIS — J45909 Unspecified asthma, uncomplicated: Secondary | ICD-10-CM | POA: Diagnosis present

## 2015-03-31 DIAGNOSIS — Z881 Allergy status to other antibiotic agents status: Secondary | ICD-10-CM | POA: Diagnosis not present

## 2015-03-31 DIAGNOSIS — Z7952 Long term (current) use of systemic steroids: Secondary | ICD-10-CM | POA: Diagnosis not present

## 2015-03-31 DIAGNOSIS — I2510 Atherosclerotic heart disease of native coronary artery without angina pectoris: Secondary | ICD-10-CM | POA: Diagnosis present

## 2015-03-31 DIAGNOSIS — I701 Atherosclerosis of renal artery: Secondary | ICD-10-CM | POA: Diagnosis present

## 2015-03-31 DIAGNOSIS — Z79891 Long term (current) use of opiate analgesic: Secondary | ICD-10-CM | POA: Diagnosis not present

## 2015-03-31 DIAGNOSIS — Z7901 Long term (current) use of anticoagulants: Secondary | ICD-10-CM | POA: Diagnosis not present

## 2015-03-31 DIAGNOSIS — Z86718 Personal history of other venous thrombosis and embolism: Secondary | ICD-10-CM | POA: Diagnosis not present

## 2015-03-31 DIAGNOSIS — N189 Chronic kidney disease, unspecified: Secondary | ICD-10-CM | POA: Diagnosis not present

## 2015-03-31 DIAGNOSIS — N179 Acute kidney failure, unspecified: Secondary | ICD-10-CM | POA: Diagnosis present

## 2015-03-31 DIAGNOSIS — Z87891 Personal history of nicotine dependence: Secondary | ICD-10-CM | POA: Diagnosis not present

## 2015-03-31 LAB — GLUCOSE, CAPILLARY
GLUCOSE-CAPILLARY: 287 mg/dL — AB (ref 65–99)
Glucose-Capillary: 191 mg/dL — ABNORMAL HIGH (ref 65–99)
Glucose-Capillary: 292 mg/dL — ABNORMAL HIGH (ref 65–99)
Glucose-Capillary: 196 mg/dL — ABNORMAL HIGH (ref 65–99)

## 2015-03-31 LAB — BASIC METABOLIC PANEL
Anion gap: 9 (ref 5–15)
BUN: 47 mg/dL — AB (ref 6–20)
CHLORIDE: 96 mmol/L — AB (ref 101–111)
CO2: 29 mmol/L (ref 22–32)
Calcium: 8.7 mg/dL — ABNORMAL LOW (ref 8.9–10.3)
Creatinine, Ser: 1.49 mg/dL — ABNORMAL HIGH (ref 0.44–1.00)
GFR calc Af Amer: 38 mL/min — ABNORMAL LOW (ref >60)
GFR calc non Af Amer: 32 mL/min — ABNORMAL LOW (ref >60)
GLUCOSE: 229 mg/dL — AB (ref 65–99)
POTASSIUM: 4.7 mmol/L (ref 3.5–5.1)
Sodium: 134 mmol/L — ABNORMAL LOW (ref 135–145)

## 2015-03-31 MED ORDER — METOLAZONE 2.5 MG PO TABS
2.5000 mg | ORAL_TABLET | Freq: Every day | ORAL | Status: DC
  Administered 2015-03-31 – 2015-04-01 (×2): 2.5 mg via ORAL
  Filled 2015-03-31 (×2): qty 1

## 2015-03-31 MED ORDER — INSULIN GLARGINE 100 UNIT/ML ~~LOC~~ SOLN
30.0000 [IU] | Freq: Every day | SUBCUTANEOUS | Status: DC
  Administered 2015-04-01 – 2015-04-02 (×2): 30 [IU] via SUBCUTANEOUS
  Filled 2015-03-31 (×3): qty 0.3

## 2015-03-31 MED ORDER — FUROSEMIDE 10 MG/ML IJ SOLN
80.0000 mg | Freq: Two times a day (BID) | INTRAMUSCULAR | Status: DC
Start: 2015-03-31 — End: 2015-04-01
  Administered 2015-03-31 – 2015-04-01 (×2): 80 mg via INTRAVENOUS
  Filled 2015-03-31 (×4): qty 8

## 2015-03-31 NOTE — Progress Notes (Signed)
[facility] PROGRESS NOTE  Filed Weights   03/29/15 0445 03/30/15 0546 03/31/15 0419  Weight: 80.015 kg (176 lb 6.4 oz) 80.564 kg (177 lb 9.8 oz) 83.326 kg (183 lb 11.2 oz)        Intake/Output Summary (Last 24 hours) at 03/31/15 1044 Last data filed at 03/31/15 0800  Gross per 24 hour  Intake    720 ml  Output   1702 ml  Net   -982 ml     Assessment/Plan: Acute on chronic renal failure: Baseline creatinine around 1.6. On admission it was 2.3. Estimated dry weight around 80 kg. Cont IV lasix.  Chronic rest.  failure on home oxygen/obstructive sleep apnea/obesity hypoventilation syndrome/severe RV dysfunction: - Continue home oxygen.  Coronary artery disease status post stents to the LAD in 2008: Currently chest pain-free asymptomatic continue metoprolol.  Uncontrolled diabetes mellitus type 2: Continue long-acting insulin plus sliding scale as blood glucose slowly trending down.  Chronic diastolic heart failure: IV Lasix his prednisone as she is mildly overweight. Continue to monitor potassium.  Chronic atrial fibrillation: CHADSVASC 7 Currently anticoagulated with Eliquis.  Rate controlled with metoprolol and cardizem  Code Status: Full Code Family Communication: none at bedside Disposition Plan: home in 1-2days   Consultants:  Cards  Antibiotics:  none  HPI/Subjective: Relates she feels much better.  Objective: Filed Vitals:   03/30/15 1636 03/30/15 2126 03/31/15 0419 03/31/15 0548  BP:  129/79 129/66   Pulse:  95 79   Temp:  98.2 F (36.8 C) 97.8 F (36.6 C)   TempSrc:  Oral Oral   Resp:  18 18   Height:      Weight:   83.326 kg (183 lb 11.2 oz)   SpO2: 96% 97% 97% 98%     Exam:  General: Alert, awake, oriented x3, in no acute distress.  HEENT: No bruits, no goiter.  Heart: Regular rate and rhythm.+1 edema. Lungs: Good air movement, clear Abdomen: Soft, nontender, nondistended, positive bowel sounds.  Neuro: Grossly intact,  nonfocal.   Data Reviewed: Basic Metabolic Panel:  Recent Labs Lab 03/25/15 0316 03/28/15 1858 03/29/15 0522 03/30/15 0447 03/31/15 0904  NA 136 131* 133* 134* 134*  K 4.2 5.0 3.8 4.4 4.7  CL 85* 90* 93* 95* 96*  CO2 43* 29 30 32 29  GLUCOSE 226* 418* 268* 129* 229*  BUN 72* 82* 73* 59* 47*  CREATININE 1.97* 2.66* 2.32* 1.89* 1.49*  CALCIUM 8.7* 8.7* 8.3* 8.5* 8.7*   Liver Function Tests:  Recent Labs Lab 03/29/15 0522  AST 25  ALT 43  ALKPHOS 61  BILITOT 0.7  PROT 5.3*  ALBUMIN 2.9*   No results for input(s): LIPASE, AMYLASE in the last 168 hours. No results for input(s): AMMONIA in the last 168 hours. CBC:  Recent Labs Lab 03/28/15 1858 03/29/15 0522 03/30/15 0447  WBC 9.7 8.3 9.3  HGB 13.4 12.6 13.0  HCT 41.9 39.4 42.4  MCV 95.4 94.7 96.4  PLT 203 182 181   Cardiac Enzymes: No results for input(s): CKTOTAL, CKMB, CKMBINDEX, TROPONINI in the last 168 hours. BNP (last 3 results)  Recent Labs  12/05/14 2250 03/16/15 1924 03/28/15 2018  BNP 1143.9* 1109.9* 1496.2*    ProBNP (last 3 results) No results for input(s): PROBNP in the last 8760 hours.  CBG:  Recent Labs Lab 03/30/15 0519 03/30/15 1121 03/30/15 1645 03/30/15 2118 03/31/15 0615  GLUCAP 136* 195* 223* 355* 191*    No results found for this or any previous  visit (from the past 240 hour(s)).   Studies: No results found.  Scheduled Meds: . allopurinol  100 mg Oral Daily  . apixaban  5 mg Oral BID  . budesonide  0.25 mg Nebulization Daily  . diltiazem  120 mg Oral Daily  . furosemide  40 mg Oral Daily  . insulin aspart  0-15 Units Subcutaneous TID WC  . insulin aspart  0-5 Units Subcutaneous QHS  . insulin glargine  25 Units Subcutaneous Q breakfast  . insulin glargine  40 Units Subcutaneous QHS  . isosorbide mononitrate  30 mg Oral Daily  . levothyroxine  25 mcg Oral Once per day on Mon Wed Fri  . levothyroxine  50 mcg Oral Once per day on Sun Tue Thu Sat  .  methocarbamol  500 mg Oral QHS  . metoprolol  50 mg Oral BID  . oxybutynin  10 mg Oral Daily  . polyethylene glycol  17 g Oral Daily  . predniSONE  20 mg Oral Q breakfast  . sodium chloride  3 mL Intravenous Q12H   Continuous Infusions:    Charlynne Cousins  [facility] Pager [phone] if 7PM-7AM, please contact night-coverage at [email or web address], password [facility] 03/31/2015, 10:44 AM

## 2015-03-31 NOTE — Progress Notes (Signed)
Advanced Heart Failure Rounding Note   Primary Cardiologist: Irish Lack Primary HF: Thierry Dobosz  Subjective:    Feels more swollen. Breathing ok.    Out 0.8 L, but weight shows up 6 lbs.  Cr continues to improve 2.32 -> 1.89 -> 1.4. Baseline appears to be 1.4-1.6.  Objective:   Weight Range: 83.326 kg (183 lb 11.2 oz) Body mass index is 35.6 kg/(m^2).   Vital Signs:   Temp:  [97.8 F (36.6 C)-98.2 F (36.8 C)] 97.9 F (36.6 C) (08/18 1322) Pulse Rate:  [79-100] 100 (08/18 1322) Resp:  [18] 18 (08/18 1322) BP: (120-139)/(66-79) 139/77 mmHg (08/18 1322) SpO2:  [94 %-98 %] 94 % (08/18 1322) Weight:  [83.326 kg (183 lb 11.2 oz)] 83.326 kg (183 lb 11.2 oz) (08/18 0419) Last BM Date: 03/30/15  Weight change: Filed Weights   03/29/15 0445 03/30/15 0546 03/31/15 0419  Weight: 80.015 kg (176 lb 6.4 oz) 80.564 kg (177 lb 9.8 oz) 83.326 kg (183 lb 11.2 oz)    Intake/Output:   Intake/Output Summary (Last 24 hours) at 03/31/15 1355 Last data filed at 03/31/15 1300  Gross per 24 hour  Intake    720 ml  Output   1902 ml  Net  -1182 ml     Physical Exam: General: Elderly chronically-ill appearing. NAD Lying flat in bed.  HEENT: Normocephalic, atraumatic. 02 via [place].  Neck: supple. JVP jaw . Carotids 2+ bilat; no bruits. No lymphadenopathy or thryomegaly noted. Cor: PMI nondisplaced. Irregularly irregular rate & rhythm. No rubs, gallops or murmurs appreciated Lungs: CTA Abdomen: soft, NT, ND. No HSM. No bruits or masses. Good bowel sounds. Extremities: no cyanosis, clubbing, rash, tr-1+ edema Neuro: alert & orientedx3, cranial nerves grossly intact. moves all 4 extremities w/o difficulty. Affect pleasant   Telemetry: Afib 90s  Labs: CBC  Recent Labs  03/29/15 0522 03/30/15 0447  WBC 8.3 9.3  HGB 12.6 13.0  HCT 39.4 42.4  MCV 94.7 96.4  PLT 182 409   Basic Metabolic Panel  Recent Labs  03/30/15 0447 03/31/15 0904  NA 134* 134*  K 4.4 4.7  CL 95* 96*  CO2  32 29  GLUCOSE 129* 229*  BUN 59* 47*  CALCIUM 8.5* 8.7*   Liver Function Tests  Recent Labs  03/29/15 0522  AST 25  ALT 43  ALKPHOS 61  BILITOT 0.7  PROT 5.3*  ALBUMIN 2.9*   No results for input(s): LIPASE, AMYLASE in the last 72 hours. Cardiac Enzymes No results for input(s): CKTOTAL, CKMB, CKMBINDEX, TROPONINI in the last 72 hours.  BNP: BNP (last 3 results)  Recent Labs  12/05/14 2250 03/16/15 1924 03/28/15 2018  BNP 1143.9* 1109.9* 1496.2*    ProBNP (last 3 results) No results for input(s): PROBNP in the last 8760 hours.   D-Dimer No results for input(s): DDIMER in the last 72 hours. Hemoglobin A1C No results for input(s): HGBA1C in the last 72 hours. Fasting Lipid Panel No results for input(s): CHOL, HDL, LDLCALC, TRIG, CHOLHDL, LDLDIRECT in the last 72 hours. Thyroid Function Tests  Recent Labs  03/30/15 0447  TSH 2.951    Other results:     Imaging/Studies:  No results found.  Latest Echo  Latest Cath   Medications:     Scheduled Medications: . allopurinol  100 mg Oral Daily  . apixaban  5 mg Oral BID  . budesonide  0.25 mg Nebulization Daily  . diltiazem  120 mg Oral Daily  . furosemide  40 mg Oral Daily  .  insulin aspart  0-15 Units Subcutaneous TID WC  . insulin aspart  0-5 Units Subcutaneous QHS  . [START ON 04/01/2015] insulin glargine  30 Units Subcutaneous Q breakfast  . insulin glargine  40 Units Subcutaneous QHS  . isosorbide mononitrate  30 mg Oral Daily  . levothyroxine  25 mcg Oral Once per day on Mon Wed Fri  . levothyroxine  50 mcg Oral Once per day on Sun Tue Thu Sat  . methocarbamol  500 mg Oral QHS  . metoprolol  50 mg Oral BID  . oxybutynin  10 mg Oral Daily  . polyethylene glycol  17 g Oral Daily  . predniSONE  20 mg Oral Q breakfast  . sodium chloride  3 mL Intravenous Q12H    Infusions:    PRN Medications: acetaminophen **OR** acetaminophen, albuterol, guaiFENesin, ondansetron **OR** ondansetron  (ZOFRAN) IV, polyethylene glycol, senna-docusate, traMADol   Assessment/Plan   1. Acute on chronic diastolic CHF/right heart failure : Echo 03/17/15 EF 60-65%, RV severely dilated with moderately reduced systolic function. 2. Pulmonary Hypertension - PA peak pressure 68 mm Hg last echo. [place] PA 65/30 (43) 3. COPD - On chronic 02 4. Paroxysmal atrial fibrillation, on Eliquis PTA - Rate controlled on metoprolol and cardizem 5. H/o CAD s/p stenting of LAD 2008. 6. AKI on CKD stage III 7. Diabetes mellitus 8. OSA  Cr 1.4 Back at most recent baseline of 1.4-1.6.  Fluid status remains elevated by exam.  K 4.7. Hold supp today.  Will recheck tomorrow.  Weight up despite re-addition of po lasix. Will give IV lasix today and dose of metolazone   She has very narrow euvolemic window. Can consider careful trial of sildenafil to help with PAH if she can afford but need to watch for pulmonary edema.   Length of Stay:    Glori Bickers MD  03/31/2015, 1:55 PM  Advanced Heart Failure Team Pager [phone] (M-F; 7a - 4p)  Please contact [facility] for night-coverage after hours (4p -7a ) and weekends on [email or web address]

## 2015-03-31 NOTE — Plan of Care (Signed)
Problem: Phase I Progression Outcomes Goal: Other Phase I Outcomes/Goals Outcome: Progressing Pt's output so far this shift is a little over 1 liter. Fluid intake is about 640ml. Po lasix has been changed to IV lasix. Continue to monitor intake and output. Pt able to ambulate to bathroom with O2 on at 2L/min and tolerating well. She denies sob with ambulation.

## 2015-03-31 NOTE — Care Management Note (Signed)
Case Management Note  Patient Details  Name: [name] MRN: [id number] Date of Birth: 10-Jun-1934  Subjective/Objective:       Pt was admitted with acute on chronic renal failure             Action/Plan:  Pt is independent from home.  CM will monitor to determine disposition needs   Expected Discharge Date:  03/30/15               Expected Discharge Plan:  Home/Self Care  In-House Referral:     Discharge planning Services  CM Consult  Post Acute Care Choice:    Choice offered to:  Patient (Pt is already active with [facility])  DME Arranged:    DME Agency:     HH Arranged:  RN, PT, OT, Nurse's Aide [place] Agency:  [place]  Status of Service:  In process, will continue to follow  Medicare Important Message Given:    Date Medicare IM Given:    Medicare IM give by:    Date Additional Medicare IM Given:    Additional Medicare Important Message give by:     If discussed at [place] of Stay Meetings, dates discussed:    Additional Comments:  Maryclare Labrador, RN 03/31/2015, 11:38 AM

## 2015-04-01 ENCOUNTER — Ambulatory Visit: Payer: Medicare Other | Admitting: Interventional Cardiology

## 2015-04-01 ENCOUNTER — Encounter: Payer: Self-pay | Admitting: *Deleted

## 2015-04-01 DIAGNOSIS — I10 Essential (primary) hypertension: Secondary | ICD-10-CM

## 2015-04-01 LAB — BASIC METABOLIC PANEL
ANION GAP: 9 (ref 5–15)
BUN: 49 mg/dL — ABNORMAL HIGH (ref 6–20)
CALCIUM: 8.7 mg/dL — AB (ref 8.9–10.3)
CO2: 32 mmol/L (ref 22–32)
CREATININE: 1.66 mg/dL — AB (ref 0.44–1.00)
Chloride: 93 mmol/L — ABNORMAL LOW (ref 101–111)
GFR, EST AFRICAN AMERICAN: 33 mL/min — AB (ref >60)
GFR, EST NON AFRICAN AMERICAN: 28 mL/min — AB (ref >60)
GLUCOSE: 151 mg/dL — AB (ref 65–99)
Potassium: 4.5 mmol/L (ref 3.5–5.1)
Sodium: 134 mmol/L — ABNORMAL LOW (ref 135–145)

## 2015-04-01 LAB — GLUCOSE, CAPILLARY
Glucose-Capillary: 102 mg/dL — ABNORMAL HIGH (ref 65–99)
Glucose-Capillary: 257 mg/dL — ABNORMAL HIGH (ref 65–99)
Glucose-Capillary: 382 mg/dL — ABNORMAL HIGH (ref 65–99)
Glucose-Capillary: 359 mg/dL — ABNORMAL HIGH (ref 65–99)

## 2015-04-01 MED ORDER — HYDRALAZINE HCL 25 MG PO TABS: 12.5000 mg | ORAL_TABLET | Freq: Three times a day (TID) | ORAL | Status: DC

## 2015-04-01 MED ORDER — AMLODIPINE BESYLATE 5 MG PO TABS
2.5000 mg | ORAL_TABLET | Freq: Every day | ORAL | Status: DC
  Administered 2015-04-01 – 2015-04-02 (×2): 2.5 mg via ORAL
  Filled 2015-04-01 (×2): qty 1

## 2015-04-01 MED ORDER — FUROSEMIDE 40 MG PO TABS
40.0000 mg | ORAL_TABLET | Freq: Every day | ORAL | Status: DC
  Administered 2015-04-01 – 2015-04-02 (×2): 40 mg via ORAL
  Filled 2015-04-01 (×2): qty 1

## 2015-04-01 NOTE — Care Management Important Message (Signed)
Important Message  Patient Details  Name: [name] MRN: [id number] Date of Birth: Jul 21, 1934   Medicare Important Message Given:  Yes-third notification given    Maryclare Labrador, RN 04/01/2015, 11:34 AM

## 2015-04-01 NOTE — Care Management Note (Addendum)
Case Management Note  Patient Details  Name: ADEENA BERNABE MRN: [id number] Date of Birth: 1934/07/01  Subjective/Objective:       Pt was admitted with acute on chronic renal failure             Action/Plan:  Pt is independent from home.  CM will monitor to determine disposition needs   Expected Discharge Date:  03/30/15               Expected Discharge Plan:  Home/Self Care  In-House Referral:     Discharge planning Services  CM Consult  Post Acute Care Choice:    Choice offered to:  Patient (Pt is already active with [facility])  DME Arranged:    DME Agency:     HH Arranged:  RN, PT, OT, Nurse's Aide [place] Agency:  [place]  Status of Service:  In process, will continue to follow  Medicare Important Message Given:    Date Medicare IM Given:    Medicare IM give by:    Date Additional Medicare IM Given:    Additional Medicare Important Message give by:     If discussed at [place] of Stay Meetings, dates discussed:    Additional Comments: CM contacted by [name] alerting that pt is already active, agency requesting resumption orders.    Maryclare Labrador, RN 04/01/2015, 8:25 AM

## 2015-04-01 NOTE — Progress Notes (Signed)
Advanced Heart Failure Rounding Note   Primary Cardiologist: Irish Lack Primary HF: Sherrell Farish  Subjective:    Feels great today.  Only complaint is that she is tired.  Out 3.2 L, with down 2 lbs. Cr bump with IV lasix 2.32 -> 1.89 -> 1.4 -> 1.66. Baseline appears to be 1.4-1.6.  Objective:   Weight Range: 181 lb 3.5 oz (82.2 kg) Body mass index is 35.11 kg/(m^2).   Vital Signs:   Temp:  [97.7 F (36.5 C)-97.9 F (36.6 C)] 97.7 F (36.5 C) (08/19 0459) Pulse Rate:  [63-100] 63 (08/19 0459) Resp:  [18-20] 20 (08/19 0459) BP: (120-145)/(70-91) 145/74 mmHg (08/19 0459) SpO2:  [94 %-100 %] 100 % (08/19 0459) Weight:  [181 lb 3.5 oz (82.2 kg)] 181 lb 3.5 oz (82.2 kg) (08/19 0459) Last BM Date: 03/31/15  Weight change: Filed Weights   03/30/15 0546 03/31/15 0419 04/01/15 0459  Weight: 177 lb 9.8 oz (80.564 kg) 183 lb 11.2 oz (83.326 kg) 181 lb 3.5 oz (82.2 kg)    Intake/Output:   Intake/Output Summary (Last 24 hours) at 04/01/15 0742 Last data filed at 04/01/15 1610  Gross per 24 hour  Intake    920 ml  Output   4202 ml  Net  -3282 ml     Physical Exam: General: Elderly chronically-ill appearing. Lying in bed  No resp difficulty HEENT: Normocephalic, atraumatic. 02 via [place].  Neck: supple. JVP 8-9. Carotids 2+ bilat; no bruits. No lymphadenopathy or thryomegaly Cor: PMI nondisplaced. Irregularly irregular rate & rhythm. No rubs, gallops or murmurs noted Lungs: Clear Abdomen: soft, NT, ND. No HSM. No bruits or masses. +BS Extremities: no cyanosis, clubbing, rash, tr edema Neuro: alert & orientedx3, cranial nerves grossly intact. moves all 4 extremities w/o difficulty. Affect pleasant  Telemetry: Afib 80s, occasional PVCs  Labs: CBC  Recent Labs  03/30/15 0447  WBC 9.3  HGB 13.0  HCT 42.4  MCV 96.4  PLT 960   Basic Metabolic Panel  Recent Labs  03/31/15 0904 04/01/15 0320  NA 134* 134*  K 4.7 4.5  CL 96* 93*  CO2 29 32  GLUCOSE 229* 151*  BUN 47*  49*  CALCIUM 8.7* 8.7*   Liver Function Tests No results for input(s): AST, ALT, ALKPHOS, BILITOT, PROT, ALBUMIN in the last 72 hours. No results for input(s): LIPASE, AMYLASE in the last 72 hours. Cardiac Enzymes No results for input(s): CKTOTAL, CKMB, CKMBINDEX, TROPONINI in the last 72 hours.  BNP: BNP (last 3 results)  Recent Labs  12/05/14 2250 03/16/15 1924 03/28/15 2018  BNP 1143.9* 1109.9* 1496.2*    ProBNP (last 3 results) No results for input(s): PROBNP in the last 8760 hours.   D-Dimer No results for input(s): DDIMER in the last 72 hours. Hemoglobin A1C No results for input(s): HGBA1C in the last 72 hours. Fasting Lipid Panel No results for input(s): CHOL, HDL, LDLCALC, TRIG, CHOLHDL, LDLDIRECT in the last 72 hours. Thyroid Function Tests  Recent Labs  03/30/15 0447  TSH 2.951    Other results:     Imaging/Studies:  No results found.  Latest Echo  Latest Cath   Medications:     Scheduled Medications: . allopurinol  100 mg Oral Daily  . apixaban  5 mg Oral BID  . budesonide  0.25 mg Nebulization Daily  . diltiazem  120 mg Oral Daily  . furosemide  80 mg Intravenous BID  . insulin aspart  0-15 Units Subcutaneous TID WC  . insulin aspart  0-5 Units  Subcutaneous QHS  . insulin glargine  30 Units Subcutaneous Q breakfast  . insulin glargine  40 Units Subcutaneous QHS  . isosorbide mononitrate  30 mg Oral Daily  . levothyroxine  25 mcg Oral Once per day on Mon Wed Fri  . levothyroxine  50 mcg Oral Once per day on Sun Tue Thu Sat  . methocarbamol  500 mg Oral QHS  . metolazone  2.5 mg Oral Daily  . metoprolol  50 mg Oral BID  . oxybutynin  10 mg Oral Daily  . polyethylene glycol  17 g Oral Daily  . predniSONE  20 mg Oral Q breakfast  . sodium chloride  3 mL Intravenous Q12H    Infusions:    PRN Medications: acetaminophen **OR** acetaminophen, albuterol, guaiFENesin, ondansetron **OR** ondansetron (ZOFRAN) IV, polyethylene glycol,  senna-docusate, traMADol   Assessment/Plan   1. Acute on chronic diastolic CHF/right heart failure : Echo 03/17/15 EF 60-65%, RV severely dilated with moderately reduced systolic function. 2. Pulmonary Hypertension - PA peak pressure 68 mm Hg last echo. [place] PA 65/30 (43) 3. COPD - On chronic 02 4. Paroxysmal atrial fibrillation, on Eliquis PTA - Rate controlled on metoprolol and cardizem 5. H/o CAD s/p stenting of LAD 2008. 6. AKI on CKD stage III 7. Diabetes mellitus 8. OSA  Cr 1.66 with IV lasix. Remains near recent baseline of 1.4-1.6.  She has a very narrow euvolemic window.   K 4.5. Continue to hold supp today.  Will follow.  Will add low dose hydralazine for HTN. No ACE/ARB with CKD. On Imdur 30mg  daily.  Continue IV lasix and metolazone today and monitor Cr.  Still up 5 lbs from lowest this admission.  Length of Stay:   Shirley Friar PA-C 04/01/2015, 7:42 AM  Advanced Heart Failure Team Pager [phone] (M-F; 7a - 4p)  Please contact [facility] for night-coverage after hours (4p -7a ) and weekends on [email or web address]  Patient seen and examined with Oda Kilts, PA-C. We discussed all aspects of the encounter. I agree with the assessment and plan as stated above.   Volume status improving. Will switch back to po lasix 40 mg daily. Will give first dose tonight. May be able to go home in am. Management complicated by narrow euvolemic window. Will need BMET early next week.   Add amlodipine for HTN.   Donia Yokum,MD 2:10 PM

## 2015-04-01 NOTE — Consult Note (Signed)
   [facility] CM Inpatient Consult   04/01/2015  [name] [facility] [birth year] [id number]   Patient evaluated for [place] Management services. Of note, patient has THN in the recent past. However, at bedside today, patient agreeable to post hospital transition of care calls and possible monthly home visits. Consents obtained. She reports her home phone is the best contact number [phone]. Discussed that [facility] services will not interfere with home health. She reports she does not intend on having home health because " I do not need it, I am doing a lot better than I was." Reports she does not mind being called and possibly being seen once a month for disease management, reinforcement, and education regarding  COPD, CHF, DM. Patient endorses she wears home o2 at home. She lives with husband who is currently at bedside. [facility] Care Management packet left along with contact information. Will request for patient to be assigned. Spoke with inpatient RNCM about bedside discussion with patient.  Marthenia Rolling, MSN-Ed, RN,BSN [facility] Liaison [phone]

## 2015-04-01 NOTE — Progress Notes (Signed)
[facility] PROGRESS NOTE  Filed Weights   03/30/15 0546 03/31/15 0419 04/01/15 0459  Weight: 80.564 kg (177 lb 9.8 oz) 83.326 kg (183 lb 11.2 oz) 82.2 kg (181 lb 3.5 oz)        Intake/Output Summary (Last 24 hours) at 04/01/15 1000 Last data filed at 04/01/15 0849  Gross per 24 hour  Intake   1040 ml  Output   4101 ml  Net  -3061 ml     Assessment/Plan: Acute on chronic renal failure: Baseline creatinine around 1.6. On admission it was 2.3. Likely due to overdiuresis. Estimated dry weight around 80 kg. Now on Lasix and Zaroxolyn, further management per cardiology.  Chronic rest.  failure on home oxygen/obstructive sleep apnea/obesity hypoventilation syndrome/severe RV dysfunction: - Continue home oxygen.  Coronary artery disease status post stents to the LAD in 2008: Currently chest pain-free asymptomatic continue metoprolol.  Uncontrolled diabetes mellitus type 2: Continue long-acting insulin plus sliding scale as blood glucose slowly trending down.  Acute on Chronic diastolic heart failure: On IV Lasix and Suboxone per cardiology. On imdurr, Lasix, metoprolol and diltiazem. Further management per cardiology.  Chronic atrial fibrillation: CHADSVASC 7 Currently anticoagulated with Eliquis.  Rate controlled with metoprolol and cardizem  Code Status: Full Code Family Communication: none at bedside Disposition Plan: home in am   Consultants:  Cards  Antibiotics:  none  HPI/Subjective: Relates she feels much better.  Objective: Filed Vitals:   03/31/15 1322 03/31/15 2124 04/01/15 0459 04/01/15 0831  BP: 139/77 140/91 145/74   Pulse: 100 67 63   Temp: 97.9 F (36.6 C) 97.8 F (36.6 C) 97.7 F (36.5 C)   TempSrc: Oral Oral Oral   Resp: 18 18 20    Height:      Weight:   82.2 kg (181 lb 3.5 oz)   SpO2: 94% 99% 100% 99%     Exam:  General: Alert, awake, oriented x3, in no acute distress.  HEENT: No bruits, no goiter.  Heart: Regular  rate and rhythm.no edema. Lungs: Good air movement, clear Abdomen: Soft, nontender, nondistended, positive bowel sounds.  Neuro: Grossly intact, nonfocal.   Data Reviewed: Basic Metabolic Panel:  Recent Labs Lab 03/28/15 1858 03/29/15 0522 03/30/15 0447 03/31/15 0904 04/01/15 0320  NA 131* 133* 134* 134* 134*  K 5.0 3.8 4.4 4.7 4.5  CL 90* 93* 95* 96* 93*  CO2 29 30 32 29 32  GLUCOSE 418* 268* 129* 229* 151*  BUN 82* 73* 59* 47* 49*  CREATININE 2.66* 2.32* 1.89* 1.49* 1.66*  CALCIUM 8.7* 8.3* 8.5* 8.7* 8.7*   Liver Function Tests:  Recent Labs Lab 03/29/15 0522  AST 25  ALT 43  ALKPHOS 61  BILITOT 0.7  PROT 5.3*  ALBUMIN 2.9*   No results for input(s): LIPASE, AMYLASE in the last 168 hours. No results for input(s): AMMONIA in the last 168 hours. CBC:  Recent Labs Lab 03/28/15 1858 03/29/15 0522 03/30/15 0447  WBC 9.7 8.3 9.3  HGB 13.4 12.6 13.0  HCT 41.9 39.4 42.4  MCV 95.4 94.7 96.4  PLT 203 182 181   Cardiac Enzymes: No results for input(s): CKTOTAL, CKMB, CKMBINDEX, TROPONINI in the last 168 hours. BNP (last 3 results)  Recent Labs  12/05/14 2250 03/16/15 1924 03/28/15 2018  BNP 1143.9* 1109.9* 1496.2*    ProBNP (last 3 results) No results for input(s): PROBNP in the last 8760 hours.  CBG:  Recent Labs Lab 03/31/15 0615 03/31/15 1116 03/31/15 1646 03/31/15 2117 04/01/15 5361  GLUCAP 191* 287* 292* 196* 102*    No results found for this or any previous visit (from the past 240 hour(s)).   Studies: No results found.  Scheduled Meds: . allopurinol  100 mg Oral Daily  . apixaban  5 mg Oral BID  . budesonide  0.25 mg Nebulization Daily  . diltiazem  120 mg Oral Daily  . furosemide  80 mg Intravenous BID  . insulin aspart  0-15 Units Subcutaneous TID WC  . insulin aspart  0-5 Units Subcutaneous QHS  . insulin glargine  30 Units Subcutaneous Q breakfast  . insulin glargine  40 Units Subcutaneous QHS  . isosorbide mononitrate   30 mg Oral Daily  . levothyroxine  25 mcg Oral Once per day on Mon Wed Fri  . levothyroxine  50 mcg Oral Once per day on Sun Tue Thu Sat  . methocarbamol  500 mg Oral QHS  . metolazone  2.5 mg Oral Daily  . metoprolol  50 mg Oral BID  . oxybutynin  10 mg Oral Daily  . polyethylene glycol  17 g Oral Daily  . predniSONE  20 mg Oral Q breakfast  . sodium chloride  3 mL Intravenous Q12H   Continuous Infusions:    Charlynne Cousins  [facility] Pager [phone] if 7PM-7AM, please contact night-coverage at [email or web address], password [facility] 04/01/2015, 10:00 AM  LOS: 1 day

## 2015-04-01 NOTE — Plan of Care (Signed)
Problem: Phase I Progression Outcomes Goal: Other Phase I Outcomes/Goals Outcome: Progressing Pt has been diuresing well on IV lasix. Urine output is over 1 liter so far this shift. Bilateral lung sounds clear. Will continue to monitor intake and output.  Problem: Phase II Progression Outcomes Goal: Pain controlled Outcome: Progressing Pt denies any pain this shift. Goal: Tolerating diet Outcome: Progressing Pt's appetite is great. She has eaten 100% of both breakfast and lunch today.

## 2015-04-01 NOTE — Care Management Note (Signed)
Case Management Note  Patient Details  Name: [name] MRN: [id number] Date of Birth: 09-22-1933  Subjective/Objective:       Pt was admitted with acute on chronic renal failure             Action/Plan:  Pt is independent from home.  Pt with [name] prior to admit.  Pt referred to [facility], resource at bedside.  CM will monitor to determine disposition needs   Expected Discharge Date:  03/30/15               Expected Discharge Plan:  Home/Self Care  In-House Referral:     Discharge planning Services  CM Consult  Post Acute Care Choice:    Choice offered to:  Patient (Pt is already active with [facility])  DME Arranged:    DME Agency:     HH Arranged:  RN, PT, OT, Nurse's Aide (Pt refused) HH Agency:    Status of Service:  In process, will continue to follow  Medicare Important Message Given:  Yes-third notification given Date Medicare IM Given:    Medicare IM give by:    Date Additional Medicare IM Given:    Additional Medicare Important Message give by:     If discussed at [place] of Stay Meetings, dates discussed:    Additional Comments: CM assessed pt, pt is refusing HH recommendations at this time.  Pt stated "I feel as good as I did before I had to come in here".  Pt noticeably out of breath during conversation, CM informed pt of assessment, pt stated she didn't need home health.  CM contacted [facility], agency acknowledge pt denial for resumption of services.  Per pt; pt was already on home O2 prior to admit.   rClaxton, Abelino Derrick, RN 04/01/2015, 11:34 AM

## 2015-04-02 DIAGNOSIS — I482 Chronic atrial fibrillation: Secondary | ICD-10-CM

## 2015-04-02 LAB — BASIC METABOLIC PANEL
ANION GAP: 12 (ref 5–15)
BUN: 58 mg/dL — ABNORMAL HIGH (ref 6–20)
CALCIUM: 8.5 mg/dL — AB (ref 8.9–10.3)
CHLORIDE: 91 mmol/L — AB (ref 101–111)
CO2: 29 mmol/L (ref 22–32)
Creatinine, Ser: 1.65 mg/dL — ABNORMAL HIGH (ref 0.44–1.00)
GFR calc non Af Amer: 29 mL/min — ABNORMAL LOW (ref >60)
GFR, EST AFRICAN AMERICAN: 33 mL/min — AB (ref >60)
Glucose, Bld: 137 mg/dL — ABNORMAL HIGH (ref 65–99)
POTASSIUM: 3.8 mmol/L (ref 3.5–5.1)
Sodium: 132 mmol/L — ABNORMAL LOW (ref 135–145)

## 2015-04-02 LAB — GLUCOSE, CAPILLARY
GLUCOSE-CAPILLARY: 94 mg/dL (ref 65–99)
GLUCOSE-CAPILLARY: 235 mg/dL — AB (ref 65–99)

## 2015-04-02 MED ORDER — FUROSEMIDE 20 MG PO TABS: 20.0000 mg | ORAL_TABLET | Freq: Every evening | ORAL | Status: DC

## 2015-04-02 MED ORDER — PREDNISONE 5 MG PO TABS: 5.0000 mg | ORAL_TABLET | Freq: Every day | ORAL | Status: DC

## 2015-04-02 MED ORDER — PREDNISONE 10 MG PO TABS: 10.0000 mg | ORAL_TABLET | Freq: Every day | ORAL | Status: DC

## 2015-04-02 MED ORDER — FUROSEMIDE 40 MG PO TABS
40.0000 mg | ORAL_TABLET | Freq: Every day | ORAL | Status: DC
Start: 2015-04-02 — End: 2015-04-02

## 2015-04-02 MED ORDER — AMLODIPINE BESYLATE 2.5 MG PO TABS: 2.5000 mg | ORAL_TABLET | Freq: Every day | ORAL | Status: DC

## 2015-04-02 MED ORDER — FUROSEMIDE 40 MG PO TABS: 40.0000 mg | ORAL_TABLET | Freq: Every morning | ORAL | Status: DC

## 2015-04-02 NOTE — Discharge Summary (Addendum)
Physician Discharge Summary  [name] QZR:[id number] DOB: 05-Jan-1934 DOA: 03/28/2015  PCP: Cari Caraway, MD  Admit date: 03/28/2015 Discharge date: 04/02/2015  Time spent: 35 minutes  Recommendations for Outpatient Follow-up:  1. Follow-up with cardiology in 1 week will check a basic metabolic panel. 2. Will need further titration of diuretics as she has a narrow therapeutic window.   BNP    Component Value Date/Time   PROBNP 3992.0* 09/12/2013 0255   Filed Weights   03/31/15 0419 04/01/15 0459 04/02/15 0524  Weight: 83.326 kg (183 lb 11.2 oz) 82.2 kg (181 lb 3.5 oz) 81.874 kg (180 lb 8 oz)     Discharge Diagnoses:  Principal Problem:   Acute on chronic renal failure Active Problems:   Diabetes type 2, uncontrolled   Obstructive sleep apnea   Essential hypertension   Atrial fibrillation   Chronic diastolic heart failure   Chronic anticoagulation   Right ventricular dysfunction   Chronic respiratory failure   Cardiac failure   Acute on chronic diastolic congestive heart failure   Acute on chronic diastolic CHF (congestive heart failure), NYHA class 1   Discharge Condition: stable  Diet recommendation: low sodium    History of present illness:  81 year old female with past medical history of coronary artery disease, A. fib on Eluquis, obstructive sleep apnea, obesity hypoventilation syndrome on chronic oxygen with RV failure, chronic NEC stage III and multiple admissions for respiratory failure  comes in for shortness of breath that started since the previous discharge.  Hospital Course:  Acute on chronic respiratory failure with hypoxia due to Acute on chronic diastolic heart failure/acute on chronic renal failure/cardiorenal syndrome: Came into the hospital she was started on IV diuretics and metolazone. She diuresed over 8 L, electrolytes were monitored she was fluid restricted. Estimated dry weight is somewhere between 79-80 kg. She will go home  on Lasix 40 mg daily, Imdur 30 mg daily, metoprolol 51 g twice a day.  Uncontrolled diabetes mellitus type 2: Long-acting insulin had to be increased as she was on prednisone which is being tapered down. No changes were made to her medication.  Chronic atrial fibrillation: CHADSVASC 7 Currently anticoagulated with Eliquis.  Rate controlled with metoprolol and cardizem  Essential hypertension: Low dose Norvasc was added for blood pressure control.  Procedures:  CXR  Consultations:  cards  Discharge Exam: Filed Vitals:   04/02/15 0524  BP: 138/82  Pulse: 54  Temp: 97.5 F (36.4 C)  Resp: 16    General: A&O x3 Cardiovascular: RRR Respiratory: good air movement CTA B/L  Discharge Instructions      Discharge Instructions    AMB Referral to [facility] Management    Complete by:  As directed   Please assign to [facility] for disease management,education, reinforcement for COPD, CHF, DM. Currently in hospital. Also currently declining [place]. Consents obtained. Please call with question. Marthenia Rolling, [place], [facility] Liaison-[phone]  Reason for consult:  Please assign to [facility] RNCM  Diagnoses of:   Heart Failure COPD/ Pneumonia Diabetes    Expected date of contact:  1-3 days (reserved for hospital discharges)     Diet - low sodium heart healthy    Complete by:  As directed      Increase activity slowly    Complete by:  As directed             Medication List    STOP taking these medications        levofloxacin 250  MG tablet  Commonly known as:  LEVAQUIN      TAKE these medications        acetaminophen 500 MG tablet  Commonly known as:  TYLENOL  Take 1,000 mg by mouth every 6 (six) hours as needed.     albuterol 108 (90 BASE) MCG/ACT inhaler  Commonly known as:  PROAIR HFA  1-2 puffs QID prn     albuterol (2.5 MG/3ML) 0.083% nebulizer solution  Commonly known as:  PROVENTIL  Take 3 mLs (2.5 mg total) by nebulization every 2  (two) hours as needed for wheezing or shortness of breath.     allopurinol 100 MG tablet  Commonly known as:  ZYLOPRIM  Take 1 tablet (100 mg total) by mouth daily.     amLODipine 2.5 MG tablet  Commonly known as:  NORVASC  Take 1 tablet (2.5 mg total) by mouth daily.     apixaban 5 MG Tabs tablet  Commonly known as:  ELIQUIS  Take 1 tablet (5 mg total) by mouth 2 (two) times daily.     azelastine 0.1 % nasal spray  Commonly known as:  ASTELIN  1-2 sprays each nostril once or twice daily     budesonide 0.25 MG/2ML nebulizer solution  Commonly known as:  PULMICORT  Take 2 mLs (0.25 mg total) by nebulization daily.     colchicine 0.6 MG tablet  Take 1.2 mg by mouth daily as needed (for gout flareups).     diltiazem 120 MG 24 hr capsule  Commonly known as:  CARDIZEM CD  Take 1 capsule (120 mg total) by mouth daily.     DITROPAN XL 10 MG 24 hr tablet  Generic drug:  oxybutynin  Take 10 mg by mouth daily.     furosemide 40 MG tablet  Commonly known as:  LASIX  Take 1 tablet (40 mg total) by mouth daily. Contact cardiology office or PCP if you experienced > 3 pounds overnight or > 5 pounds in 1 week for further instructions     guaiFENesin 600 MG 12 hr tablet  Commonly known as:  MUCINEX  Take 2 tablets (1,200 mg total) by mouth 2 (two) times daily.     insulin glargine 100 UNIT/ML injection  Commonly known as:  LANTUS  Inject 0.2 mLs (20 Units total) into the skin daily.     insulin lispro 100 UNIT/ML injection  Commonly known as:  HUMALOG  Inject 2 Units into the skin 2 (two) times daily with breakfast and lunch. Per sliding scale. Patient unsure about how much she really takes     isosorbide mononitrate 30 MG 24 hr tablet  Commonly known as:  IMDUR  1 tablet by mouth daily     levothyroxine 50 MCG tablet  Commonly known as:  SYNTHROID, LEVOTHROID  Take 50 mcg by mouth See admin instructions. Take 50mg  on Tues, Thurs, Sat and sundays     levothyroxine 25 MCG  tablet  Commonly known as:  SYNTHROID, LEVOTHROID  Take 25 mcg by mouth See admin instructions. Take 1 tablet (25 mcg) on Monday, Wednesday, Friday     methocarbamol 500 MG tablet  Commonly known as:  ROBAXIN  Take 500 mg by mouth at bedtime.     metoprolol 50 MG tablet  Commonly known as:  LOPRESSOR  1 tablet by mouth twice a day     nitroGLYCERIN 0.4 MG SL tablet  Commonly known as:  NITROSTAT  Place 0.4 mg under the tongue every 5 (five)  minutes as needed for chest pain.     polyethylene glycol packet  Commonly known as:  MIRALAX / GLYCOLAX  Take 17 g by mouth daily.     predniSONE 5 MG tablet  Commonly known as:  DELTASONE  Take 1 tablet (5 mg total) by mouth daily with breakfast.     senna-docusate 8.6-50 MG per tablet  Commonly known as:  Senokot-S  Take 1 tablet by mouth at bedtime as needed for mild constipation.     traMADol 50 MG tablet  Commonly known as:  ULTRAM  Take 50 mg by mouth every 6 (six) hours as needed for moderate pain.       Allergies  Allergen Reactions  . Cefuroxime Axetil Shortness Of Breath, Swelling and Rash    Throat swelling  . Cephalexin Shortness Of Breath, Swelling and Rash    Throat swelling  . Clarithromycin Shortness Of Breath, Swelling and Rash    Throat swelling  . Doxycycline Shortness Of Breath, Swelling and Rash     rash on legs and feet, throat swelling  . Erythromycin Shortness Of Breath, Swelling and Rash    Throat swelling  . Tetracycline Shortness Of Breath, Swelling and Rash    Throat swelling      The results of significant diagnostics from this hospitalization (including imaging, microbiology, ancillary and laboratory) are listed below for reference.    Significant Diagnostic Studies: Dg Chest 2 View  03/28/2015   CLINICAL DATA:  Shortness of breath and chest pain. Recent hospital admission for CHF and renal failure.  EXAM: CHEST  2 VIEW  COMPARISON:  03/25/2015 and 09/12/2013  FINDINGS: Lungs are adequately  inflated without focal consolidation or effusion. Stable mild cardiomegaly. Calcified plaque over the aortic arch. There are degenerative changes of the spine. Right shoulder prosthesis unchanged. Moderate compression fracture over the lower thoracic spine unchanged. Mild loss of vertebral body height of several mid to upper thoracic vertebrae unchanged.  IMPRESSION: No active cardiopulmonary disease.  Stable moderate compression fracture over the lower thoracic spine with stable loss of height of several mid to upper thoracic vertebral bodies.   Electronically Signed   By: Marin Olp M.D.   On: 03/28/2015 19:33   Dg Chest 2 View  03/25/2015   CLINICAL DATA:  Shortness of breath, cough, history of COPD, CHF, pulmonary hypertension.  EXAM: CHEST  2 VIEW  COMPARISON:  PA and lateral chest x-ray of March 18, 2015  FINDINGS: The appearance of the pulmonary interstitium has improved which likely reflects some resolution of interstitial edema. The cardiac silhouette remains enlarged. The pulmonary vascularity is less prominent centrally. There is no pleural effusion. There is wedge compression of the body of T12 which is stable and there is mild loss of height of approximately T3 and T5. There is a prosthetic right shoulder joint.  IMPRESSION: Slight interval improvement in the appearance of the chest. There are chronic bronchitic changes with decreasing pulmonary interstitial edema.   Electronically Signed   By: David  Martinique M.D.   On: 03/25/2015 07:56   Dg Chest 2 View  03/18/2015   CLINICAL DATA:  Cough and nausea for 2 months, dyspnea, coronary artery disease, CHF, atrial fibrillation, COPD, diabetes mellitus, coronary artery disease, former smoker  EXAM: CHEST  2 VIEW  COMPARISON:  03/16/2015  FINDINGS: Enlargement of cardiac silhouette with vascular congestion.  Atherosclerotic calcification aorta.  Peribronchial thickening with chronic accentuation of interstitial markings little changed.  Mitral annular  calcification noted.  Bibasilar atelectasis.  No pulmonary infiltrate, pleural effusion or pneumothorax.  Osseous demineralization with compression deformity of a vertebral body at the thoracolumbar junction question T12 unchanged.  RIGHT shoulder prosthesis with LEFT glenohumeral degenerative changes noted.  Surgical clips RIGHT upper quadrant likely prior cholecystectomy.  IMPRESSION: Chronic bronchitic and interstitial changes with bibasilar atelectasis.  Enlargement of cardiac silhouette with pulmonary vascular congestion.   Electronically Signed   By: Lavonia Dana M.D.   On: 03/18/2015 10:14   Dg Chest 2 View  03/16/2015   CLINICAL DATA:  Fall today. Left sided chest pain and shortness of breath. Initial encounter. COPD and congestive heart failure.  EXAM: CHEST  2 VIEW  COMPARISON:  Seven hundred fourteen  FINDINGS: Mild to moderate cardiomegaly remains stable. Both lungs are clear. No evidence of pulmonary infiltrate or edema. No evidence of pneumothorax or pleural effusion. Right shoulder prosthesis again noted.  IMPRESSION: Stable cardiomegaly.  No active lung disease.   Electronically Signed   By: Earle Gell M.D.   On: 03/16/2015 20:45   Ct Head Wo Contrast  03/16/2015   CLINICAL DATA:  Tripped on carpet. Hit head on site of table. Headache, scalp hematoma, and neck pain.  EXAM: CT HEAD WITHOUT CONTRAST  CT CERVICAL SPINE WITHOUT CONTRAST  TECHNIQUE: Multidetector CT imaging of the head and cervical spine was performed following the standard protocol without intravenous contrast. Multiplanar CT image reconstructions of the cervical spine were also generated.  COMPARISON:  Head CT on 12/05/2014 and cervical spine CT on 12/31/2009  FINDINGS: CT HEAD FINDINGS  There is no evidence of intracranial hemorrhage, brain edema, or other signs of acute infarction. There is no evidence of intracranial mass lesion or mass effect. No abnormal extraaxial fluid collections are identified.  Mild to moderate chronic  small vessel disease noted. No evidence of hydrocephalus. No evidence of skull fracture or pneumocephalus.  CT CERVICAL SPINE FINDINGS  No evidence of acute fracture, subluxation, or prevertebral soft tissue swelling.  Cervical degenerative disc disease is seen at most levels which is most pronounced at C4-5, C5-6, and C6-7. Mild to moderate facet DJD is also seen bilaterally at these levels. Atlantoaxial degenerative changes also noted. Generalized osteopenia demonstrated.  IMPRESSION: No acute intracranial abnormality. Mild to moderate chronic small vessel disease.  No evidence of acute cervical spine fracture or subluxation. Degenerative spondylosis, as described above. Osteopenia.   Electronically Signed   By: Earle Gell M.D.   On: 03/16/2015 21:28   Ct Cervical Spine Wo Contrast  03/16/2015   CLINICAL DATA:  Tripped on carpet. Hit head on site of table. Headache, scalp hematoma, and neck pain.  EXAM: CT HEAD WITHOUT CONTRAST  CT CERVICAL SPINE WITHOUT CONTRAST  TECHNIQUE: Multidetector CT imaging of the head and cervical spine was performed following the standard protocol without intravenous contrast. Multiplanar CT image reconstructions of the cervical spine were also generated.  COMPARISON:  Head CT on 12/05/2014 and cervical spine CT on 12/31/2009  FINDINGS: CT HEAD FINDINGS  There is no evidence of intracranial hemorrhage, brain edema, or other signs of acute infarction. There is no evidence of intracranial mass lesion or mass effect. No abnormal extraaxial fluid collections are identified.  Mild to moderate chronic small vessel disease noted. No evidence of hydrocephalus. No evidence of skull fracture or pneumocephalus.  CT CERVICAL SPINE FINDINGS  No evidence of acute fracture, subluxation, or prevertebral soft tissue swelling.  Cervical degenerative disc disease is seen at most levels which is most pronounced at C4-5, C5-6, and  C6-7. Mild to moderate facet DJD is also seen bilaterally at these levels.  Atlantoaxial degenerative changes also noted. Generalized osteopenia demonstrated.  IMPRESSION: No acute intracranial abnormality. Mild to moderate chronic small vessel disease.  No evidence of acute cervical spine fracture or subluxation. Degenerative spondylosis, as described above. Osteopenia.   Electronically Signed   By: Earle Gell M.D.   On: 03/16/2015 21:28    Microbiology: No results found for this or any previous visit (from the past 240 hour(s)).   Labs: Basic Metabolic Panel:  Recent Labs Lab 03/29/15 0522 03/30/15 0447 03/31/15 0904 04/01/15 0320 04/02/15 0311  NA 133* 134* 134* 134* 132*  K 3.8 4.4 4.7 4.5 3.8  CL 93* 95* 96* 93* 91*  CO2 30 32 29 32 29  GLUCOSE 268* 129* 229* 151* 137*  BUN 73* 59* 47* 49* 58*  CREATININE 2.32* 1.89* 1.49* 1.66* 1.65*  CALCIUM 8.3* 8.5* 8.7* 8.7* 8.5*   Liver Function Tests:  Recent Labs Lab 03/29/15 0522  AST 25  ALT 43  ALKPHOS 61  BILITOT 0.7  PROT 5.3*  ALBUMIN 2.9*   No results for input(s): LIPASE, AMYLASE in the last 168 hours. No results for input(s): AMMONIA in the last 168 hours. CBC:  Recent Labs Lab 03/28/15 1858 03/29/15 0522 03/30/15 0447  WBC 9.7 8.3 9.3  HGB 13.4 12.6 13.0  HCT 41.9 39.4 42.4  MCV 95.4 94.7 96.4  PLT 203 182 181   Cardiac Enzymes: No results for input(s): CKTOTAL, CKMB, CKMBINDEX, TROPONINI in the last 168 hours. BNP: BNP (last 3 results)  Recent Labs  12/05/14 2250 03/16/15 1924 03/28/15 2018  BNP 1143.9* 1109.9* 1496.2*    ProBNP (last 3 results) No results for input(s): PROBNP in the last 8760 hours.  CBG:  Recent Labs Lab 04/01/15 0604 04/01/15 1109 04/01/15 1645 04/01/15 2053 04/02/15 0631  GLUCAP 102* 257* 382* 359* 94       Signed:  FELIZ ORTIZ, [place] Coopman  [facility] 04/02/2015, 7:42 AM

## 2015-04-02 NOTE — Progress Notes (Signed)
Patient had 11 beats of Vtach, VSS, MD notified, no new orders given at this time. RN will continue to monitor.  [name]

## 2015-04-04 NOTE — Consult Note (Signed)
Referral was assigned to Raina Mina for Case Management

## 2015-04-05 ENCOUNTER — Other Ambulatory Visit: Payer: Self-pay | Admitting: *Deleted

## 2015-04-05 NOTE — Patient Outreach (Signed)
[facility]) Care Management  04/05/2015  [name] 1933/12/02 [id number]   Initial transition of care contact.  RN spoke with pt today and introduced the [facility] program and services. Pt was receptive and continued to explain her current issues to "toe discomfort". States her pain started to her back area and now just in her toes. States she has the medication colchicine to take "as needed" but not sure if she should take the medications. Takes she has taken Tylenol but continues to have discomfort.  RN has encouraged pt to contact her contact her provider Dr. Addison Lank to inquired further on an intervention to address her ongoing discomfort. Also inquired if pt has completed a hospital post-op follow up with her primary (pt denied). RN strongly encouraged pt when she contact her primary provider to also inquired on a follow up appointment due to her recent discharge from the hospital.  Further discussed on pt's knowledge base related to her ongoing medical issues. Pt reports with her HF she weighed yesterday at 174 lbs and today 174.2 lbs and denies any swelling to her hands, legs or trunk area. Pt denies any SOB or breathing issues with activities and has had HF for several years. RN discussed the HF zones and when to seek medical attention related to the 3 lbs over night and 5 lbs within one week along with any other participating symptoms of distress. RN discussed the plan of care and goals surrounding improvement in he self management of care. Pt was receptive to the goals and plan of care discussed today. RN will review weekly during the transition of care contacts and offered pt a home visit. Pt declined a visit this week however receptive to an ongoing follow up call next week and will further discuss a community home visit at that time. RN has received permission to follow up next Monday and inquire further on a scheduled home visit.    Raina Mina, RN Care Management  Coordinator [facility] [place] [facility] Main Office [phone]

## 2015-04-06 ENCOUNTER — Telehealth: Payer: Self-pay | Admitting: Interventional Cardiology

## 2015-04-06 MED ORDER — FUROSEMIDE 40 MG PO TABS: 40.0000 mg | ORAL_TABLET | Freq: Two times a day (BID) | ORAL | Status: DC

## 2015-04-06 NOTE — Telephone Encounter (Signed)
Kerin Ransom PA aware he recommends for pt to increased lasix to 40 mg BID. Pt  to make a F/U visit in this office next week. Pt has an appointment with Dr Irish Lack on Monday August 29 th at 9:30 AM. Pt is aware of recommendations and appointment. she verbalized understanding.

## 2015-04-06 NOTE — Telephone Encounter (Signed)
New message   Pt has gained 3 pounds overnight Pt c/o swelling: STAT is pt has developed SOB within 24 hours  1. How long have you been experiencing swelling? Overnight  2. Where is the swelling located? All over; weight gain of 3 pounds overnight  3.  Are you currently taking a "fluid pill"?Lasix  4.  Are you currently SOB? A little SOB  5.  Have you traveled recently?No

## 2015-04-06 NOTE — Telephone Encounter (Signed)
Pt called because she states had gained 3 lbs overnight. Pt states has a little SOB , she denies swelling of LE. Pt takes Lasix 40 mg in the AM and 20 mg in the evening. Pt states was instructed to call for the doctor for weigh gain 3 lbs overnight or 5 lbs in a week. Pt has been F/U for CHF by Dr. Haroldine Laws before. Pt last office visit with  Dr. Irish Lack was in 06/09/14. Pt had an appointment with Dr Irish Lack 03/31/15 but was cancelled because she was in the hospital. [facility] The care management is following pt. Pt called the hospitalist this AM and was suggested to call the office for recommendations.

## 2015-04-11 ENCOUNTER — Ambulatory Visit (INDEPENDENT_AMBULATORY_CARE_PROVIDER_SITE_OTHER): Payer: Medicare Other | Admitting: Interventional Cardiology

## 2015-04-11 ENCOUNTER — Encounter: Payer: Self-pay | Admitting: Interventional Cardiology

## 2015-04-11 ENCOUNTER — Other Ambulatory Visit: Payer: Self-pay | Admitting: *Deleted

## 2015-04-11 VITALS — BP 128/84 | HR 60 | Ht 60.25 in | Wt 179.0 lb

## 2015-04-11 DIAGNOSIS — I5032 Chronic diastolic (congestive) heart failure: Secondary | ICD-10-CM | POA: Diagnosis not present

## 2015-04-11 DIAGNOSIS — I639 Cerebral infarction, unspecified: Secondary | ICD-10-CM

## 2015-04-11 DIAGNOSIS — I482 Chronic atrial fibrillation, unspecified: Secondary | ICD-10-CM

## 2015-04-11 DIAGNOSIS — I25119 Atherosclerotic heart disease of native coronary artery with unspecified angina pectoris: Secondary | ICD-10-CM | POA: Diagnosis not present

## 2015-04-11 DIAGNOSIS — N289 Disorder of kidney and ureter, unspecified: Secondary | ICD-10-CM

## 2015-04-11 DIAGNOSIS — Z7901 Long term (current) use of anticoagulants: Secondary | ICD-10-CM

## 2015-04-11 DIAGNOSIS — R0602 Shortness of breath: Secondary | ICD-10-CM

## 2015-04-11 LAB — BASIC METABOLIC PANEL
BUN: 40 mg/dL — ABNORMAL HIGH (ref 6–23)
CALCIUM: 9 mg/dL (ref 8.4–10.5)
CHLORIDE: 101 meq/L (ref 96–112)
CO2: 32 mEq/L (ref 19–32)
CREATININE: 1.8 mg/dL — AB (ref 0.40–1.20)
GFR: 28.85 mL/min — AB (ref >60.00)
Glucose, Bld: 151 mg/dL — ABNORMAL HIGH (ref 70–99)
Potassium: 4.1 mEq/L (ref 3.5–5.1)
Sodium: 141 mEq/L (ref 135–145)

## 2015-04-11 LAB — BRAIN NATRIURETIC PEPTIDE: PRO B NATRI PEPTIDE: 1411 pg/mL — AB (ref 0.0–100.0)

## 2015-04-11 NOTE — Patient Outreach (Signed)
[place] [facility]) Care Management  04/11/2015  Tony Friscia [facility] 27-Aug-1933 [id number]   Second Transition of care contact  RN attempted to reach pt however unsuccessful. RN able to leave a generic HIPAA approved voice message requesting a call back. Will intervene further at that time for [facility] services. Currently awaiting a call back however will continue outreach attempts accordingly.  Raina Mina, RN Care Management Coordinator [place] [facility] Main Office [phone]

## 2015-04-11 NOTE — Progress Notes (Signed)
Patient ID: [name], female   DOB: 01/07/1934, 81 y.o.   MRN: [id number]     Cardiology Office Note   Date:  04/11/2015   ID:  [name], DOB [birth year], MRN [id number]  PCP:  Cari Caraway, MD    No chief complaint on file. f/u AFib, CHF   Wt Readings from Last 3 Encounters:  04/11/15 179 lb (81.194 kg)  04/02/15 180 lb 8 oz (81.874 kg)  03/25/15 175 lb 4.8 oz (79.516 kg)       History of Present Illness: [name] is a 81 y.o. female  who has been recently hospitalized due to diastolic heart failure. She was found to have moderate pulmonary hypertension on recent right heart catheterization. She has had coronary artery disease with LAD stenting in 2008. She has had rate control atrial fibrillation and is anticoagulated with Eliquis.  Since leaving the hospital, she has weighed herself daily. Her weight had increased by couple of pounds. Her furosemide dose was increased from 60 mg daily to 80 mg daily for a few days. Her weight came down to her baseline of 175-176 pounds. She is now back on the 40 mg in the morning and 20 mg in the afternoon. She continues to limit her salt intake.  She did have some sharp left-sided chest pains. She took a nitroglycerin and the pains resolved. While in the hospital, she ruled out for MI. She had renal insufficiency with a peak creatinine of 2.3. This came down to 1.6 at the time of discharge. She has follow-up with [facility] as well as the heart failure clinic.       Past Medical History  Diagnosis Date  . CAD (coronary artery disease)   . CHF (congestive heart failure)   . Paroxysmal atrial fibrillation   . Bradycardia   . Hypertension   . Renal artery stenosis   . COPD (chronic obstructive pulmonary disease)   . Obesity hypoventilation syndrome   . Pruritus   . Tracheobronchitis   . Acute rhinosinusitis   . Anemia   . Hypoxemia   . Peripheral edema   . Obesity, endogenous   . Diabetes mellitus   .  Acid reflux disease   . Osteoarthritis, generalized   . Hypothyroidism   . Lung nodule     lingula  . OSA (obstructive sleep apnea)     NPSG 04/03/00--AHI 28.hr  . DVT (deep venous thrombosis) 2003    left leg  . Vaginal atrophy   . Hx of colonic polyps   . Acute diastolic heart failure   . Secondary pulmonary hypertension 06/09/2013  . Blood transfusion without reported diagnosis 2009    had internal bleeding  . Cancer 1959    ovarian cancer  . Osteoporosis     Past Surgical History  Procedure Laterality Date  . Lumbar spine surgery    . Cholecystectomy    . Hemorroidectomy    . Repair wound fistula    . Right shoulder    . Elbow surgery    . Dilation and curettage of uterus    . Breast surgery  1998    lumpectomy  . Oophorectomy  1959    BSO-? ovarian cancer  . Bilateral salpingoophorectomy  1959    -d/t ovarian cancer  . Rotator cuff repair Right   . Cardiac catheterization N/A 03/21/2015    Procedure: Right Heart Cath;  Surgeon: Jolaine Artist, MD;  Location: [facility];  Service: Cardiovascular;  Laterality:  N/A;     Current Outpatient Prescriptions  Medication Sig Dispense Refill  . acetaminophen (TYLENOL) 500 MG tablet Take 1,000 mg by mouth every 6 (six) hours as needed.    Marland Kitchen albuterol (PROAIR HFA) 108 (90 BASE) MCG/ACT inhaler 1-2 puffs QID prn (Patient taking differently: Inhale 1 puff into the lungs every 4 (four) hours as needed for shortness of breath. ) 1 Inhaler 6  . albuterol (PROVENTIL) (2.5 MG/3ML) 0.083% nebulizer solution Take 3 mLs (2.5 mg total) by nebulization every 2 (two) hours as needed for wheezing or shortness of breath. 75 mL 3  . allopurinol (ZYLOPRIM) 100 MG tablet Take 1 tablet (100 mg total) by mouth daily.    Marland Kitchen amLODipine (NORVASC) 2.5 MG tablet Take 1 tablet (2.5 mg total) by mouth daily. 30 tablet 0  . apixaban (ELIQUIS) 5 MG TABS tablet Take 1 tablet (5 mg total) by mouth 2 (two) times daily. 180 tablet 0  . azelastine  (ASTELIN) 137 MCG/SPRAY nasal spray 1-2 sprays each nostril once or twice daily 90 mL 3  . budesonide (PULMICORT) 0.25 MG/2ML nebulizer solution Take 2 mLs (0.25 mg total) by nebulization daily. 60 mL prn  . colchicine 0.6 MG tablet Take 1.2 mg by mouth daily as needed (for gout flareups).     . diltiazem (CARDIZEM CD) 120 MG 24 hr capsule Take 1 capsule (120 mg total) by mouth daily. 5 capsule 0  . furosemide (LASIX) 40 MG tablet Take 1 tablet (40 mg total) by mouth 2 (two) times daily. Contact cardiology office or PCP if you experienced > 3 pounds overnight or > 5 pounds in 1 week for further instructions 60 tablet 0  . guaiFENesin (MUCINEX) 600 MG 12 hr tablet Take 2 tablets (1,200 mg total) by mouth 2 (two) times daily. 12 tablet 0  . insulin glargine (LANTUS) 100 UNIT/ML injection Inject 0.2 mLs (20 Units total) into the skin daily. (Patient taking differently: Inject 20-35 Units into the skin 2 (two) times daily. Takes 20 units in am and 35 units in pm) 10 mL 12  . insulin lispro (HUMALOG) 100 UNIT/ML injection Inject 2 Units into the skin 2 (two) times daily with breakfast and lunch. Per sliding scale. Patient unsure about how much she really takes    . isosorbide mononitrate (IMDUR) 30 MG 24 hr tablet 1 tablet by mouth daily (Patient taking differently: Take 30 mg by mouth daily. 1 tablet by mouth daily) 90 tablet 0  . levothyroxine (SYNTHROID, LEVOTHROID) 25 MCG tablet Take 25 mcg by mouth See admin instructions. Take 1 tablet (25 mcg) on Monday, Wednesday, Friday    . levothyroxine (SYNTHROID, LEVOTHROID) 50 MCG tablet Take 50 mcg by mouth See admin instructions. Take 50mg  on Tues, Thurs, Sat and sundays    . methocarbamol (ROBAXIN) 500 MG tablet Take 500 mg by mouth at bedtime.    . metoprolol (LOPRESSOR) 50 MG tablet 1 tablet by mouth twice a day (Patient taking differently: Take 50 mg by mouth 2 (two) times daily. 1 tablet by mouth twice a day) 180 tablet 0  . nitroGLYCERIN (NITROSTAT) 0.4  MG SL tablet Place 0.4 mg under the tongue every 5 (five) minutes as needed for chest pain.     Marland Kitchen oxybutynin (DITROPAN XL) 10 MG 24 hr tablet Take 10 mg by mouth daily.     . polyethylene glycol (MIRALAX / GLYCOLAX) packet Take 17 g by mouth daily. 14 each 0  . senna-docusate (SENOKOT-S) 8.6-50 MG per tablet Take  1 tablet by mouth at bedtime as needed for mild constipation. 20 tablet 0  . traMADol (ULTRAM) 50 MG tablet Take 50 mg by mouth every 6 (six) hours as needed for moderate pain.      No current facility-administered medications for this visit.    Allergies:   Cefuroxime axetil; Cephalexin; Clarithromycin; Doxycycline; Erythromycin; and Tetracycline    Social History:  The patient  reports that she quit smoking about 36 years ago. She has never used smokeless tobacco. She reports that she does not drink alcohol or use illicit drugs.   Family History:  The patient's family history includes Allergies in her mother; Arthritis in her mother and sister; Bone cancer in an other family member; Breast cancer in her maternal aunt and another family member; Clotting disorder in her brother; Diabetes in her brother, sister, and another family member; Emphysema in her mother; Heart attack in her father; Heart disease in her mother and sister; Hypertension in her father and mother; Pneumonia in her mother.    ROS:  Please see the history of present illness.   Otherwise, review of systems are positive for dyspnea on exertion, left-sided chest discomfort intermittent she continues to have some dyspnea on exertion while walking in her house.ly.   All other systems are reviewed and negative.    PHYSICAL EXAM: VS:  BP 128/84 mmHg  Pulse 60  Ht 5' 0.25" (1.53 m)  Wt 179 lb (81.194 kg)  BMI 34.68 kg/m2  LMP 08/13/1957 , BMI Body mass index is 34.68 kg/(m^2). GEN: Well nourished, well developed, chronically ill-appearing HEENT: normal Neck: no JVD, carotid bruits, or masses Cardiac: Irregularly  irregular; no murmurs, rubs, or gallops, trace bilateral lower extremity edema  Respiratory:  clear to auscultation bilaterally, normal work of breathing GI: soft, nontender, nondistended, + BS MS: no deformity or atrophy Skin: warm and dry, no rash Neuro:  Strength and sensation are intact Psych: euthymic mood, full affect      Recent Labs: 03/28/2015: B Natriuretic Peptide 1496.2* 03/29/2015: ALT 43 03/30/2015: Hemoglobin 13.0; Platelets 181; TSH 2.951 04/02/2015: BUN 58*; Creatinine, Ser 1.65*; Potassium 3.8; Sodium 132*   Lipid Panel    Component Value Date/Time   CHOL 291* 12/05/2014 0234   TRIG 259* 12/05/2014 0234   HDL 28* 12/05/2014 0234   CHOLHDL 10.4 12/05/2014 0234   VLDL 52* 12/05/2014 0234   LDLCALC 211* 12/05/2014 0234     Other studies Reviewed: Additional studies/ records that were reviewed today with results demonstrating: Right heart catheterization reviewed as noted above. LVEF 60-65%.   ASSESSMENT AND PLAN:  1. Coronary artery disease: Atypical anginal symptoms. Refill nitroglycerin. Continue aggressive secondary prevention. Would like to avoid left heart catheterization given her renal insufficiency. Will try to medically manage. 2. Chronic diastolic heart failure/shortness of breath: Continue Lasix 40 mg in the morning and 20 mg in the evening. Of note, she recently completed a few days of 40 mg Lasix in the morning and 40 mg in the afternoon. Her weight went back down to her baseline of 175 pounds. Will check electrolytes and BNP today given the shortness of breath.  She has follow-up tomorrow in the heart failure clinic. I stressed the importance of daily weights at home on the same scale before eating breakfast.  3. Atrial fibrillation: Continue rate control medications. Stroke prevention with Eliquis.    Current medicines are reviewed at length with the patient today.  The patient concerns regarding her medicines were addressed.  The following changes  have been made:  No change  Labs/ tests ordered today include:   Orders Placed This Encounter  Procedures  . Basic Metabolic Panel (BMET)  . B Nat Peptide    Recommend 150 minutes/week of aerobic exercise Low fat, low carb, high fiber diet recommended  Disposition:   FU with heart failure clinic   Teresita Madura., MD  04/11/2015 10:34 AM    [facility] [place], [place], [place]  [address] Phone: [phone]; Fax: [phone]

## 2015-04-11 NOTE — Patient Instructions (Signed)
Medication Instructions:  None  Labwork: Lab work today: BMET, BNP  Testing/Procedures: None  Follow-Up: Follow up in Heart Failure clinic  Any Other Special Instructions Will Be Listed Below (If Applicable).

## 2015-04-12 ENCOUNTER — Other Ambulatory Visit (HOSPITAL_COMMUNITY): Payer: Self-pay

## 2015-04-12 ENCOUNTER — Other Ambulatory Visit: Payer: Self-pay | Admitting: *Deleted

## 2015-04-12 ENCOUNTER — Encounter (HOSPITAL_COMMUNITY): Payer: Self-pay

## 2015-04-12 ENCOUNTER — Ambulatory Visit (HOSPITAL_COMMUNITY)
Admission: RE | Admit: 2015-04-12 | Discharge: 2015-04-12 | Disposition: A | Discharge status: Home / Self Care | Payer: Medicare Other | Source: Ambulatory Visit | Attending: Internal Medicine | Admitting: Internal Medicine

## 2015-04-12 VITALS — BP 126/68 | HR 87 | Wt 179.0 lb

## 2015-04-12 DIAGNOSIS — N183 Chronic kidney disease, stage 3 (moderate): Secondary | ICD-10-CM | POA: Diagnosis not present

## 2015-04-12 DIAGNOSIS — I270 Primary pulmonary hypertension: Secondary | ICD-10-CM

## 2015-04-12 DIAGNOSIS — Z794 Long term (current) use of insulin: Secondary | ICD-10-CM | POA: Insufficient documentation

## 2015-04-12 DIAGNOSIS — E1122 Type 2 diabetes mellitus with diabetic chronic kidney disease: Secondary | ICD-10-CM | POA: Diagnosis not present

## 2015-04-12 DIAGNOSIS — I2510 Atherosclerotic heart disease of native coronary artery without angina pectoris: Secondary | ICD-10-CM | POA: Insufficient documentation

## 2015-04-12 DIAGNOSIS — Z86718 Personal history of other venous thrombosis and embolism: Secondary | ICD-10-CM | POA: Diagnosis not present

## 2015-04-12 DIAGNOSIS — I272 Other secondary pulmonary hypertension: Secondary | ICD-10-CM | POA: Diagnosis not present

## 2015-04-12 DIAGNOSIS — Z9981 Dependence on supplemental oxygen: Secondary | ICD-10-CM | POA: Insufficient documentation

## 2015-04-12 DIAGNOSIS — I5022 Chronic systolic (congestive) heart failure: Secondary | ICD-10-CM | POA: Diagnosis not present

## 2015-04-12 DIAGNOSIS — I480 Paroxysmal atrial fibrillation: Secondary | ICD-10-CM | POA: Diagnosis not present

## 2015-04-12 DIAGNOSIS — Z7902 Long term (current) use of antithrombotics/antiplatelets: Secondary | ICD-10-CM | POA: Insufficient documentation

## 2015-04-12 DIAGNOSIS — I129 Hypertensive chronic kidney disease with stage 1 through stage 4 chronic kidney disease, or unspecified chronic kidney disease: Secondary | ICD-10-CM | POA: Diagnosis not present

## 2015-04-12 DIAGNOSIS — Z8543 Personal history of malignant neoplasm of ovary: Secondary | ICD-10-CM | POA: Insufficient documentation

## 2015-04-12 DIAGNOSIS — K219 Gastro-esophageal reflux disease without esophagitis: Secondary | ICD-10-CM | POA: Insufficient documentation

## 2015-04-12 DIAGNOSIS — Z833 Family history of diabetes mellitus: Secondary | ICD-10-CM | POA: Insufficient documentation

## 2015-04-12 DIAGNOSIS — E039 Hypothyroidism, unspecified: Secondary | ICD-10-CM | POA: Diagnosis not present

## 2015-04-12 DIAGNOSIS — Z87891 Personal history of nicotine dependence: Secondary | ICD-10-CM | POA: Diagnosis not present

## 2015-04-12 DIAGNOSIS — G4733 Obstructive sleep apnea (adult) (pediatric): Secondary | ICD-10-CM | POA: Diagnosis not present

## 2015-04-12 DIAGNOSIS — J449 Chronic obstructive pulmonary disease, unspecified: Secondary | ICD-10-CM | POA: Diagnosis not present

## 2015-04-12 DIAGNOSIS — I5033 Acute on chronic diastolic (congestive) heart failure: Secondary | ICD-10-CM | POA: Insufficient documentation

## 2015-04-12 DIAGNOSIS — Z79899 Other long term (current) drug therapy: Secondary | ICD-10-CM | POA: Insufficient documentation

## 2015-04-12 DIAGNOSIS — Z8249 Family history of ischemic heart disease and other diseases of the circulatory system: Secondary | ICD-10-CM | POA: Insufficient documentation

## 2015-04-12 MED ORDER — AMLODIPINE BESYLATE 2.5 MG PO TABS: 2.5000 mg | ORAL_TABLET | Freq: Every day | ORAL | Status: DC

## 2015-04-12 MED ORDER — FUROSEMIDE 40 MG PO TABS: 40.0000 mg | ORAL_TABLET | Freq: Two times a day (BID) | ORAL | Status: DC

## 2015-04-12 MED ORDER — POTASSIUM CHLORIDE CRYS ER 20 MEQ PO TBCR: 20.0000 meq | EXTENDED_RELEASE_TABLET | Freq: Every day | ORAL | Status: DC

## 2015-04-12 MED ORDER — AMLODIPINE BESYLATE 2.5 MG PO TABS
2.5000 mg | ORAL_TABLET | Freq: Every day | ORAL | Status: DC
Start: 2015-04-12 — End: 2015-04-12

## 2015-04-12 NOTE — Progress Notes (Addendum)
REDS vest score:45

## 2015-04-12 NOTE — Patient Instructions (Signed)
Will refer you to [facility] for home health services.  INCREASE Lasix to 80mg  tonight, as well as 80 mg twice tomorrow. Then beginning Thursday take Lasix 40mg  twice daily.  START Potassium 20 meq tablet once daily.  Follow up 1 week.  Do the following things EVERYDAY: 1) Weigh yourself in the morning before breakfast. Write it down and keep it in a log. 2) Take your medicines as prescribed 3) Eat low salt foods-Limit salt (sodium) to 2000 mg per day.  4) Stay as active as you can everyday 5) Limit all fluids for the day to less than 2 liters

## 2015-04-12 NOTE — Patient Outreach (Signed)
[facility]) Care Management  04/11/2015  [name] 08-10-1934 [id number]   RN received a returned cal form pt via voice message indicated she missed my call earlier today. States she is doing well and indicated RN did not have to call her back at this time however RN will continue to follow up with ongoing transition of care calls accordingly.   Raina Mina, RN Care Management Coordinator [facility] Main Office [phone]

## 2015-04-12 NOTE — Progress Notes (Signed)
Patient ID: [name], female   DOB: [birth year], 81 y.o.   MRN: [id number] PCP: Dr Annamaria Boots  Primary HF Cardiologist: Dr Vaughan Browner  HPI: [name] is a 81 y.o. female with history of chronic diastolic HF with RV dysfunction, OSA, COPD, pulmonary hypertension, chronic rate controlled A fib, type 2 diabetes.  She was admitted to [facility] on 03/16/15 when she presented with with worsening SOB, orthopnea, and PND. BNP 1109. CXR showing pulmonary congestion. Underwent 2d echo and right heart catheterization as below. Discharge weight 175, and diuretic regimen was decreased from Lasix 40 BID to 60 daily.  Admitted 03/16/15 with increased dyspnea. Diuresed with IV lasix and transitioned to lasix 40 mg daily. Has AKI noted on admit with creatinine up to 2.6. Creatinine down to 1.65 on the day of discharge. Discharge weight was 180 pounds.   Today she returns for post hospital follow up. Increased dyspnea with exertion. Denies PND. + Orthopnea/bendopnea. Uses 2 liters oxygen continuously. CPAP currently broken. Taking 40 mg/20 mg of lasix with no improvement in dyspnea. Lives at home with husband.    [place] 03/21/15 RA = 14 RV = 60/10/14 PA = 65/30 (43) PCW = 20 Fick cardiac output/index = 3.8/2.1 PVR = 6.1 WU Arterial sat = 94% PA sat = 55%, 56%  Echo 03/17/15 EF 60-65%, RV severely dilated with moderately reduced systolic function. PA peak pressure 68 mm Hg.  ROS: All systems negative except as listed in HPI, PMH and Problem List.  SH:  Social History   Social History  . Marital Status: Married    Spouse Name: N/A  . Number of Children: 2  . Years of Education: N/A   Occupational History  . retired     Social History Main Topics  . Smoking status: Former Smoker    Quit date: 08/13/1978  . Smokeless tobacco: Never Used  . Alcohol Use: No  . Drug Use: No  . Sexual Activity: No   Other Topics Concern  . Not on file   Social History Narrative   Lives with husband        FH:  Family History  Problem Relation Age of Onset  . Pneumonia Mother   . Heart disease Mother   . Allergies Mother   . Emphysema Mother   . Arthritis Mother   . Hypertension Mother   . Heart attack Father   . Hypertension Father   . Diabetes Sister   . Heart disease Sister   . Diabetes Brother   . Diabetes      grandfather  . Breast cancer      aunt  . Bone cancer      aunt  . Clotting disorder Brother   . Arthritis Sister   . Breast cancer Maternal Aunt     Past Medical History  Diagnosis Date  . CAD (coronary artery disease)   . CHF (congestive heart failure)   . Paroxysmal atrial fibrillation   . Bradycardia   . Hypertension   . Renal artery stenosis   . COPD (chronic obstructive pulmonary disease)   . Obesity hypoventilation syndrome   . Pruritus   . Tracheobronchitis   . Acute rhinosinusitis   . Anemia   . Hypoxemia   . Peripheral edema   . Obesity, endogenous   . Diabetes mellitus   . Acid reflux disease   . Osteoarthritis, generalized   . Hypothyroidism   . Lung nodule     lingula  . OSA (obstructive sleep  apnea)     NPSG 04/03/00--AHI 28.hr  . DVT (deep venous thrombosis) 2003    left leg  . Vaginal atrophy   . Hx of colonic polyps   . Acute diastolic heart failure   . Secondary pulmonary hypertension 06/09/2013  . Blood transfusion without reported diagnosis 2009    had internal bleeding  . Cancer 1959    ovarian cancer  . Osteoporosis     Current Outpatient Prescriptions  Medication Sig Dispense Refill  . acetaminophen (TYLENOL) 500 MG tablet Take 1,000 mg by mouth every 6 (six) hours as needed.    Marland Kitchen albuterol (PROAIR HFA) 108 (90 BASE) MCG/ACT inhaler 1-2 puffs QID prn (Patient taking differently: Inhale 1 puff into the lungs every 4 (four) hours as needed for shortness of breath. ) 1 Inhaler 6  . albuterol (PROVENTIL) (2.5 MG/3ML) 0.083% nebulizer solution Take 3 mLs (2.5 mg total) by nebulization every 2 (two) hours as needed for  wheezing or shortness of breath. 75 mL 3  . allopurinol (ZYLOPRIM) 100 MG tablet Take 1 tablet (100 mg total) by mouth daily.    Marland Kitchen amLODipine (NORVASC) 2.5 MG tablet Take 1 tablet (2.5 mg total) by mouth daily. 30 tablet 0  . apixaban (ELIQUIS) 5 MG TABS tablet Take 1 tablet (5 mg total) by mouth 2 (two) times daily. 180 tablet 0  . azelastine (ASTELIN) 137 MCG/SPRAY nasal spray 1-2 sprays each nostril once or twice daily 90 mL 3  . budesonide (PULMICORT) 0.25 MG/2ML nebulizer solution Take 2 mLs (0.25 mg total) by nebulization daily. 60 mL prn  . colchicine 0.6 MG tablet Take 1.2 mg by mouth daily as needed (for gout flareups).     . diltiazem (CARDIZEM CD) 120 MG 24 hr capsule Take 1 capsule (120 mg total) by mouth daily. 5 capsule 0  . furosemide (LASIX) 40 MG tablet Take 40 mg by mouth. Take 40mg  (1 tablet) in the AM and 20mg  (1/2 tablet) in the PM    . guaiFENesin (MUCINEX) 600 MG 12 hr tablet Take 2 tablets (1,200 mg total) by mouth 2 (two) times daily. 12 tablet 0  . insulin glargine (LANTUS) 100 UNIT/ML injection Inject 0.2 mLs (20 Units total) into the skin daily. (Patient taking differently: Inject 20-35 Units into the skin 2 (two) times daily. Takes 20 units in am and 35 units in pm) 10 mL 12  . insulin lispro (HUMALOG) 100 UNIT/ML injection Inject 2 Units into the skin 2 (two) times daily with breakfast and lunch. Per sliding scale. Patient unsure about how much she really takes    . isosorbide mononitrate (IMDUR) 30 MG 24 hr tablet Take 30 mg by mouth daily.    Marland Kitchen levothyroxine (SYNTHROID, LEVOTHROID) 25 MCG tablet Take 25 mcg by mouth See admin instructions. Take 1 tablet (25 mcg) on Monday, Wednesday, Friday    . levothyroxine (SYNTHROID, LEVOTHROID) 50 MCG tablet Take 50 mcg by mouth See admin instructions. Take 50mg  on Tues, Thurs, Sat and sundays    . methocarbamol (ROBAXIN) 500 MG tablet Take 500 mg by mouth at bedtime.    . metoprolol (LOPRESSOR) 50 MG tablet Take 50 mg by mouth 2  (two) times daily.    . nitroGLYCERIN (NITROSTAT) 0.4 MG SL tablet Place 0.4 mg under the tongue every 5 (five) minutes as needed for chest pain.     Marland Kitchen oxybutynin (DITROPAN XL) 10 MG 24 hr tablet Take 10 mg by mouth daily.     . polyethylene glycol (  MIRALAX / GLYCOLAX) packet Take 17 g by mouth daily. 14 each 0  . senna-docusate (SENOKOT-S) 8.6-50 MG per tablet Take 1 tablet by mouth at bedtime as needed for mild constipation. 20 tablet 0  . traMADol (ULTRAM) 50 MG tablet Take 50 mg by mouth every 6 (six) hours as needed for moderate pain.      No current facility-administered medications for this encounter.    Filed Vitals:   04/12/15 1151  BP: 126/68  Pulse: 87  Weight: 179 lb (81.194 kg)  SpO2: 91%    PHYSICAL EXAM:  General:  Chronically ill appearing. Dyspneic walking in the clinic. Marland Kitchen  HEENT: normal Neck: supple. JVP 9-10 . Carotids 2+ bilaterally; no bruits. No lymphadenopathy or thryomegaly appreciated. Cor: PMI normal. Regular rate & rhythm. No rubs, gallops or murmurs. Lungs: RLL and LLL Rhonchi. On 2 liters [place].  Abdomen: obese, soft, nontender, ++distended. No hepatosplenomegaly. No bruits or masses. Good bowel sounds. Extremities: no cyanosis, clubbing, rash, edema Neuro: alert & orientedx3, cranial nerves grossly intact. Moves all 4 extremities w/o difficulty. Affect pleasant.    ASSESSMENT & PLAN: 1. Acute on chronic diastolic CHF/right heart failure : Echo 03/17/15 EF 60-65%, RV severely dilated with moderately reduced systolic function. NYHA IIIB. Red Vest Reading =45. Goal for reading is < 39. BNP elevated yesterday.  Volume status elevated. Today she is instructed to take 80 mg this evening, then 80 mg twice a day for 1 day, then  Drop back to 40 mg twice day.  Add 20 meq potassium.  Seems to have a narrow euvolemic window (goal < 175 pounds) .  Refer to [facility] with diuretic protocol.  Reinforced daily weights, limiting fluids twice a day, and low salt food choices.   2. Pulmonary Hypertension - PA peak pressure 68 mm Hg last echo. RHC PA 65/30 (43). Had [place]  03/2015 with PVR 6. Could consider adcirca. Will consider next visit.  3. COPD - On chronic 2 liters 02 4. Paroxysmal atrial fibrillation, on Eliquis PTA - Rate controlled on metoprolol and cardizem 5. H/o CAD s/p stenting of LAD 2008. 6. CKD stage III- Creatinine yesterday 1.8 . Repeat BMEt next visit.  7. OSA- CPAP broken. [place] notified.   Follow up next week with a BMET and check Reds Vest reading. She is at high risk for readmit. Will need to be followed closely.   CLEGG,AMY NP-C  12:50 PM

## 2015-04-14 ENCOUNTER — Telehealth (HOSPITAL_COMMUNITY): Payer: Self-pay

## 2015-04-14 NOTE — Telephone Encounter (Signed)
Nurse called on behalf of patient stating she was back to feeling bad "like she did when she was in the hospital".  Feels her fluid has built back up, no swelling noted but is very short of breath and has discomfort in chest when she ambulates at times "like my lungs wont open up because of the fluid, this is how it felt when i had a lot of fluid before".  Denies chest pain/pressure.  VSS, weight down only 2 lbs after taking extra lasix the other day, but now back up.  Per Darrick Grinder NP-C, advised patient to double back up to 80mg  lasix twice daily utnil we see her back this coming Tuesday.  ALso advised if this becomes worse over weekend, or she becomes weak, dizzy, or other concerning s/s to go to ED for eval.  Aware and agreeable.  Renee Pain

## 2015-04-19 ENCOUNTER — Ambulatory Visit (HOSPITAL_COMMUNITY)
Admission: RE | Admit: 2015-04-19 | Discharge: 2015-04-19 | Disposition: A | Discharge status: Home / Self Care | Payer: Medicare Other | Source: Ambulatory Visit | Attending: Cardiology | Admitting: Cardiology

## 2015-04-19 VITALS — BP 112/60 | HR 62 | Wt 173.8 lb

## 2015-04-19 DIAGNOSIS — I5032 Chronic diastolic (congestive) heart failure: Secondary | ICD-10-CM

## 2015-04-19 DIAGNOSIS — Z9981 Dependence on supplemental oxygen: Secondary | ICD-10-CM | POA: Insufficient documentation

## 2015-04-19 DIAGNOSIS — Z833 Family history of diabetes mellitus: Secondary | ICD-10-CM | POA: Insufficient documentation

## 2015-04-19 DIAGNOSIS — J449 Chronic obstructive pulmonary disease, unspecified: Secondary | ICD-10-CM | POA: Insufficient documentation

## 2015-04-19 DIAGNOSIS — Z794 Long term (current) use of insulin: Secondary | ICD-10-CM | POA: Insufficient documentation

## 2015-04-19 DIAGNOSIS — E039 Hypothyroidism, unspecified: Secondary | ICD-10-CM | POA: Insufficient documentation

## 2015-04-19 DIAGNOSIS — E1122 Type 2 diabetes mellitus with diabetic chronic kidney disease: Secondary | ICD-10-CM | POA: Diagnosis not present

## 2015-04-19 DIAGNOSIS — Z79899 Other long term (current) drug therapy: Secondary | ICD-10-CM | POA: Insufficient documentation

## 2015-04-19 DIAGNOSIS — I480 Paroxysmal atrial fibrillation: Secondary | ICD-10-CM | POA: Insufficient documentation

## 2015-04-19 DIAGNOSIS — Z8543 Personal history of malignant neoplasm of ovary: Secondary | ICD-10-CM | POA: Diagnosis not present

## 2015-04-19 DIAGNOSIS — I272 Other secondary pulmonary hypertension: Secondary | ICD-10-CM | POA: Diagnosis not present

## 2015-04-19 DIAGNOSIS — N183 Chronic kidney disease, stage 3 (moderate): Secondary | ICD-10-CM | POA: Insufficient documentation

## 2015-04-19 DIAGNOSIS — I2510 Atherosclerotic heart disease of native coronary artery without angina pectoris: Secondary | ICD-10-CM | POA: Insufficient documentation

## 2015-04-19 DIAGNOSIS — K219 Gastro-esophageal reflux disease without esophagitis: Secondary | ICD-10-CM | POA: Insufficient documentation

## 2015-04-19 DIAGNOSIS — I129 Hypertensive chronic kidney disease with stage 1 through stage 4 chronic kidney disease, or unspecified chronic kidney disease: Secondary | ICD-10-CM | POA: Diagnosis not present

## 2015-04-19 DIAGNOSIS — G4733 Obstructive sleep apnea (adult) (pediatric): Secondary | ICD-10-CM | POA: Diagnosis not present

## 2015-04-19 DIAGNOSIS — Z7902 Long term (current) use of antithrombotics/antiplatelets: Secondary | ICD-10-CM | POA: Diagnosis not present

## 2015-04-19 DIAGNOSIS — Z8249 Family history of ischemic heart disease and other diseases of the circulatory system: Secondary | ICD-10-CM | POA: Diagnosis not present

## 2015-04-19 DIAGNOSIS — I5033 Acute on chronic diastolic (congestive) heart failure: Secondary | ICD-10-CM | POA: Diagnosis not present

## 2015-04-19 DIAGNOSIS — Z87891 Personal history of nicotine dependence: Secondary | ICD-10-CM | POA: Diagnosis not present

## 2015-04-19 LAB — BASIC METABOLIC PANEL
ANION GAP: 10 (ref 5–15)
BUN: 43 mg/dL — ABNORMAL HIGH (ref 6–20)
CALCIUM: 9.9 mg/dL (ref 8.9–10.3)
CO2: 32 mmol/L (ref 22–32)
Chloride: 98 mmol/L — ABNORMAL LOW (ref 101–111)
Creatinine, Ser: 2.15 mg/dL — ABNORMAL HIGH (ref 0.44–1.00)
GFR, EST AFRICAN AMERICAN: 24 mL/min — AB (ref >60)
GFR, EST NON AFRICAN AMERICAN: 21 mL/min — AB (ref >60)
Glucose, Bld: 146 mg/dL — ABNORMAL HIGH (ref 65–99)
Potassium: 4.3 mmol/L (ref 3.5–5.1)
Sodium: 140 mmol/L (ref 135–145)

## 2015-04-19 LAB — BRAIN NATRIURETIC PEPTIDE: B NATRIURETIC PEPTIDE 5: 1411.2 pg/mL — AB (ref 0.0–100.0)

## 2015-04-19 MED ORDER — FUROSEMIDE 40 MG PO TABS: 80.0000 mg | ORAL_TABLET | Freq: Two times a day (BID) | ORAL | Status: DC

## 2015-04-19 MED ORDER — AMLODIPINE BESYLATE 2.5 MG PO TABS: 2.5000 mg | ORAL_TABLET | Freq: Every day | ORAL | Status: DC

## 2015-04-19 NOTE — Progress Notes (Signed)
REDS Vest Score: 45

## 2015-04-19 NOTE — Progress Notes (Signed)
Patient ID: [name], female   DOB: 1934-05-01, 81 y.o.   MRN: [id number] PCP: Dr Annamaria Boots  Primary HF Cardiologist: Dr Vaughan Browner  HPI: [name] is a 81 y.o. female with history of chronic diastolic HF with RV dysfunction, OSA, COPD, pulmonary hypertension, chronic rate controlled A fib, type 2 diabetes.  She was admitted to [facility] on 03/16/15 when she presented with with worsening SOB, orthopnea, and PND. BNP 1109. CXR showing pulmonary congestion. Underwent 2d echo and right heart catheterization as below. Discharge weight 175, and diuretic regimen was decreased from Lasix 40 BID to 60 daily.  Admitted 03/16/15 with increased dyspnea. Diuresed with IV lasix and transitioned to lasix 40 mg daily. Has AKI noted on admit with creatinine up to 2.6. Creatinine down to 1.65 on the day of discharge. Discharge weight was 180 pounds.   Today she returns for follow up. Last visit lasix was increased to 80 mg twice a day for one day then she was to cut back to 40 mg twice a day however she continued 80 mg twice a day. Overall feeling much better. Weight at home home has gone down form 180 to 171 pounds. Mild dyspnea walking. Sleeps chronically on 2 pillows. No presyncope/syncope. Uses 2 liters oxygen continuously. CPAP currently broken. Lives at home with husband.    [place] 03/21/15 RA = 14 RV = 60/10/14 PA = 65/30 (43) PCW = 20 Fick cardiac output/index = 3.8/2.1 PVR = 6.1 WU Arterial sat = 94% PA sat = 55%, 56%  Echo 03/17/15 EF 60-65%, RV severely dilated with moderately reduced systolic function. PA peak pressure 68 mm Hg.  Labs 04/11/2015: K 4.1 Creatinine 1.80 BNP 1411   ROS: All systems negative except as listed in HPI, PMH and Problem List.  SH:  Social History   Social History  . Marital Status: Married    Spouse Name: N/A  . Number of Children: 2  . Years of Education: N/A   Occupational History  . retired     Social History Main Topics  . Smoking status: Former  Smoker    Quit date: 08/13/1978  . Smokeless tobacco: Never Used  . Alcohol Use: No  . Drug Use: No  . Sexual Activity: No   Other Topics Concern  . Not on file   Social History Narrative   Lives with husband       FH:  Family History  Problem Relation Age of Onset  . Pneumonia Mother   . Heart disease Mother   . Allergies Mother   . Emphysema Mother   . Arthritis Mother   . Hypertension Mother   . Heart attack Father   . Hypertension Father   . Diabetes Sister   . Heart disease Sister   . Diabetes Brother   . Diabetes      grandfather  . Breast cancer      aunt  . Bone cancer      aunt  . Clotting disorder Brother   . Arthritis Sister   . Breast cancer Maternal Aunt     Past Medical History  Diagnosis Date  . CAD (coronary artery disease)   . CHF (congestive heart failure)   . Paroxysmal atrial fibrillation   . Bradycardia   . Hypertension   . Renal artery stenosis   . COPD (chronic obstructive pulmonary disease)   . Obesity hypoventilation syndrome   . Pruritus   . Tracheobronchitis   . Acute rhinosinusitis   . Anemia   .  Hypoxemia   . Peripheral edema   . Obesity, endogenous   . Diabetes mellitus   . Acid reflux disease   . Osteoarthritis, generalized   . Hypothyroidism   . Lung nodule     lingula  . OSA (obstructive sleep apnea)     NPSG 04/03/00--AHI 28.hr  . DVT (deep venous thrombosis) 2003    left leg  . Vaginal atrophy   . Hx of colonic polyps   . Acute diastolic heart failure   . Secondary pulmonary hypertension 06/09/2013  . Blood transfusion without reported diagnosis 2009    had internal bleeding  . Cancer 1959    ovarian cancer  . Osteoporosis     Current Outpatient Prescriptions  Medication Sig Dispense Refill  . acetaminophen (TYLENOL) 500 MG tablet Take 1,000 mg by mouth every 6 (six) hours as needed.    Marland Kitchen albuterol (PROAIR HFA) 108 (90 BASE) MCG/ACT inhaler 1-2 puffs QID prn (Patient taking differently: Inhale 1 puff  into the lungs every 4 (four) hours as needed for shortness of breath. ) 1 Inhaler 6  . albuterol (PROVENTIL) (2.5 MG/3ML) 0.083% nebulizer solution Take 3 mLs (2.5 mg total) by nebulization every 2 (two) hours as needed for wheezing or shortness of breath. 75 mL 3  . allopurinol (ZYLOPRIM) 100 MG tablet Take 1 tablet (100 mg total) by mouth daily.    Marland Kitchen amLODipine (NORVASC) 2.5 MG tablet Take 1 tablet (2.5 mg total) by mouth daily. 90 tablet 3  . apixaban (ELIQUIS) 5 MG TABS tablet Take 1 tablet (5 mg total) by mouth 2 (two) times daily. 180 tablet 0  . azelastine (ASTELIN) 137 MCG/SPRAY nasal spray 1-2 sprays each nostril once or twice daily 90 mL 3  . budesonide (PULMICORT) 0.25 MG/2ML nebulizer solution Take 2 mLs (0.25 mg total) by nebulization daily. 60 mL prn  . colchicine 0.6 MG tablet Take 1.2 mg by mouth daily as needed (for gout flareups).     . diltiazem (CARDIZEM CD) 120 MG 24 hr capsule Take 1 capsule (120 mg total) by mouth daily. 5 capsule 0  . furosemide (LASIX) 40 MG tablet Take 80 mg by mouth 2 (two) times daily.    Marland Kitchen guaiFENesin (MUCINEX) 600 MG 12 hr tablet Take 2 tablets (1,200 mg total) by mouth 2 (two) times daily. (Patient taking differently: Take 600 mg by mouth daily. ) 12 tablet 0  . insulin glargine (LANTUS) 100 UNIT/ML injection Inject 0.2 mLs (20 Units total) into the skin daily. (Patient taking differently: Inject 20-35 Units into the skin 2 (two) times daily. Takes 20 units in am and 35 units in pm) 10 mL 12  . insulin lispro (HUMALOG) 100 UNIT/ML injection Inject 2 Units into the skin 2 (two) times daily with breakfast and lunch. Per sliding scale. Patient unsure about how much she really takes    . isosorbide mononitrate (IMDUR) 30 MG 24 hr tablet Take 30 mg by mouth daily.    Marland Kitchen levothyroxine (SYNTHROID, LEVOTHROID) 25 MCG tablet Take 25 mcg by mouth See admin instructions. Take 1 tablet (25 mcg) on Monday, Wednesday, Friday    . levothyroxine (SYNTHROID, LEVOTHROID)  50 MCG tablet Take 50 mcg by mouth See admin instructions. Take 50mg  on Tues, Thurs, Sat and sundays    . methocarbamol (ROBAXIN) 500 MG tablet Take 500 mg by mouth at bedtime.    . potassium chloride SA (KLOR-CON M20) 20 MEQ tablet Take 1 tablet (20 mEq total) by mouth daily. 90 tablet  3  . metoprolol (LOPRESSOR) 50 MG tablet Take 50 mg by mouth 2 (two) times daily.    . nitroGLYCERIN (NITROSTAT) 0.4 MG SL tablet Place 0.4 mg under the tongue every 5 (five) minutes as needed for chest pain.     Marland Kitchen oxybutynin (DITROPAN XL) 10 MG 24 hr tablet Take 10 mg by mouth daily.     . polyethylene glycol (MIRALAX / GLYCOLAX) packet Take 17 g by mouth daily. 14 each 0  . senna-docusate (SENOKOT-S) 8.6-50 MG per tablet Take 1 tablet by mouth at bedtime as needed for mild constipation. 20 tablet 0  . traMADol (ULTRAM) 50 MG tablet Take 50 mg by mouth every 6 (six) hours as needed for moderate pain.      No current facility-administered medications for this encounter.    Filed Vitals:   04/19/15 1127  BP: 112/60  Pulse: 62  Weight: 173 lb 12.8 oz (78.835 kg)  SpO2: 94%    PHYSICAL EXAM:  General:  Chronically ill appearing. NAD walking in the clinic. Marland Kitchen  HEENT: normal Neck: supple. JVP 6-7 . Carotids 2+ bilaterally; no bruits. No lymphadenopathy or thryomegaly appreciated. Cor: PMI normal. Regular rate & rhythm. No rubs, gallops or murmurs. Lungs: IW  Throughout. On 2 liters [place].  Abdomen: obese, soft, nontender, nondistended. No hepatosplenomegaly. No bruits or masses. Good bowel sounds. Extremities: no cyanosis, clubbing, rash, edema Neuro: alert & orientedx3, cranial nerves grossly intact. Moves all 4 extremities w/o difficulty. Affect pleasant.    ASSESSMENT & PLAN: 1. Acute on chronic diastolic CHF/right heart failure : Echo 03/17/15 EF 60-65%, RV severely dilated with moderately reduced systolic function. NYHA IIIB. Red Vest Reading today is 45 but weight down 6 pounds.  Volume status  improved. Continue lasix 80 mg twice a day.  Continue  20 meq potassium daily. .  Seems to have a narrow euvolemic window (goal < 175 pounds) . Today she is 173 pounds and on exam volume status is stable.  Reinforced daily weights, limiting fluids twice a day, and low salt food choices.  2. Pulmonary Hypertension - PA peak pressure 68 mm Hg last echo. RHC PA 65/30 (43). Had [place]  03/2015 with PVR 6. Could consider adcirca. Will consider next visit.  3. COPD - On chronic 2 liters 02 4. Paroxysmal atrial fibrillation, on Eliquis 5 mg twice daily. Rate controlled on metoprolol and cardizem 5. H/o CAD s/p stenting of LAD 2008. 6. CKD stage III-  7. OSA- CPAP broken. [place] notified. May need another sleep study.   BMET today.   Follow up in 3 weeks   CLEGG,AMY NP-C  11:36 AM

## 2015-04-19 NOTE — Progress Notes (Signed)
Advanced Heart Failure Medication Review by a Pharmacist  Does the patient  feel that his/her medications are working for him/her?  yes  Has the patient been experiencing any side effects to the medications prescribed?  no  Does the patient measure his/her own blood pressure or blood glucose at home?  yes   Does the patient have any problems obtaining medications due to transportation or finances?   no  Understanding of regimen: good Understanding of indications: good Potential of compliance: good    Pharmacist comments: [name] is a pleasant 81 yo F presenting without a medication list but is able to verbalize each of her medications to me including their dosages. She states that she has been needing to use lasix 80 mg BID since hospital discharge and has been having frequent hand cramping lately. She also states that she has utilized 1 Nitro tablet on 2 days since her discharge for chest pains associated with SOB which helped to relieve the pain (last dose about 3-4 days ago). Since then she has been chest pain free. She did not have any specific medication-related questions or concerns for me today.

## 2015-04-19 NOTE — Patient Instructions (Signed)
FOLLOW UP in 3 weeks with Dr.Bensimhon  Routine lab work today.(bmet bnp) Will notify you of abnormal results  TAKE Lasix 80 mg (2 tablets) twice a day.

## 2015-04-21 ENCOUNTER — Other Ambulatory Visit (HOSPITAL_COMMUNITY): Payer: Self-pay | Admitting: *Deleted

## 2015-04-21 ENCOUNTER — Other Ambulatory Visit: Payer: Self-pay | Admitting: *Deleted

## 2015-04-21 MED ORDER — FUROSEMIDE 40 MG PO TABS: 60.0000 mg | ORAL_TABLET | Freq: Two times a day (BID) | ORAL | Status: DC

## 2015-04-21 MED ORDER — POTASSIUM CHLORIDE CRYS ER 20 MEQ PO TBCR: 20.0000 meq | EXTENDED_RELEASE_TABLET | Freq: Every day | ORAL | Status: DC

## 2015-04-21 NOTE — Patient Outreach (Signed)
[place] [facility]) Care Management  04/21/2015  Joud Pettinato [facility] 1933-10-08 [id number]  Third transition of care contact  RN spoke with pt today who indicates she is doing well with no major issues. States her weights have been the same over the last three days (171 lbs) with documented weights  and the [place] continues to visit three days weekly for her ongoing HF monitoring. Denies any ongoing swelling and states she is in the GREEN zone (aware of all three HF zones).  [facility] RN continues to offer community home visits however pt continues to decline at this time. Pt is aware that [place] will wean her from services soon however pt feels she is able to manage her HF at this time with the available assistance. Pt receptive to ongoing telephonic transition of care calls with the last contact next week. Pt has agreed to an afternoon call as RN will scheduled accordingly.   Raina Mina, RN Care Management Coordinator [facility] [facility] Main Office [phone]

## 2015-04-25 ENCOUNTER — Encounter: Payer: Self-pay | Admitting: *Deleted

## 2015-04-25 ENCOUNTER — Ambulatory Visit: Payer: Medicare Other | Admitting: *Deleted

## 2015-04-25 ENCOUNTER — Other Ambulatory Visit: Payer: Self-pay | Admitting: *Deleted

## 2015-04-25 NOTE — Patient Outreach (Signed)
[place] [facility]) Care Management  04/25/2015  [name] [facility] 1934/08/13 [id number]  Fourth Transition of care contact  RN spoke with pt today concerning her ongoing management of care related to her HF. Since the last conversation where pt was taking extra fluid medication (Lasix 80 mg) was reduced down to 60 mg over the weekend. Pt had a home visit today from her [place] who found pt has "a little fluid found in her lungs (denies any swelling to her extremities). Interventions completed and the [facility] nurse will follow up with a visit on Wednesday. Pt unable to recite her exact weight over the last few days but indicates "its alittle higher then her recently reported weight of 171 lbs. RN continue to encouraged daily weights and extend the offer for [facility] involvement for home visits however pt continued to declined this offer and feels she and the current nurse involved are able to manage her ongoing HF issues. Therefore pt's plan of care and goals were discussed as  Pt made aware that her primary provider would be notified her her decline at this time for [facility] involvement with community home visits. Pt has contact for [facility] if services are requested in the future. No further request or inquires at this time. Case will be closed.  Raina Mina, RN Care Management Coordinator [place] [facility] Main Office [phone]

## 2015-04-26 NOTE — Patient Outreach (Signed)
[place] [facility]) Care Management  04/26/2015  [name] 26-Apr-1934 [id number]   Notification from Raina Mina, RN to close case due to goals met with [place] Management.  Thanks, Ronnell [place]. [place], [place] Assistant Phone: [phone] Fax: [phone]

## 2015-04-28 ENCOUNTER — Telehealth (HOSPITAL_COMMUNITY): Payer: Self-pay | Admitting: *Deleted

## 2015-04-28 NOTE — Telephone Encounter (Signed)
[name] RN called and stated pt is having increased shortness of breath weight up 2lbs from yesterday.  Pt is currently on 60 mg of lasix twice a day. (started this Monday) Will follow up with Darrick Grinder, NP-C.

## 2015-04-29 NOTE — Telephone Encounter (Signed)
TRIAGE VOICEMAIL  [name] called regarding voicemail that was left 04/28/15 while in patients home Pt c/o increased SOb and mild cough Weight elevated

## 2015-04-29 NOTE — Telephone Encounter (Signed)
Patient reports weight today 174.6 Patient is very winded on the phone, c/o sob and mild LE edema  Per Vo Amy Clegg, NP  Pt should increase torsemide to 80/60 and be sure to keep follow up scheduled   RaKeisha ,RN [facility] aware and will report changes to patient

## 2015-05-17 ENCOUNTER — Ambulatory Visit (HOSPITAL_COMMUNITY)
Admission: RE | Admit: 2015-05-17 | Discharge: 2015-05-17 | Disposition: A | Discharge status: Home / Self Care | Payer: Medicare Other | Source: Ambulatory Visit | Attending: Internal Medicine | Admitting: Internal Medicine

## 2015-05-17 VITALS — BP 96/58 | HR 70 | Wt 172.5 lb

## 2015-05-17 DIAGNOSIS — I2510 Atherosclerotic heart disease of native coronary artery without angina pectoris: Secondary | ICD-10-CM | POA: Diagnosis not present

## 2015-05-17 DIAGNOSIS — Z9981 Dependence on supplemental oxygen: Secondary | ICD-10-CM | POA: Diagnosis not present

## 2015-05-17 DIAGNOSIS — E1122 Type 2 diabetes mellitus with diabetic chronic kidney disease: Secondary | ICD-10-CM | POA: Diagnosis not present

## 2015-05-17 DIAGNOSIS — Z87891 Personal history of nicotine dependence: Secondary | ICD-10-CM | POA: Insufficient documentation

## 2015-05-17 DIAGNOSIS — K219 Gastro-esophageal reflux disease without esophagitis: Secondary | ICD-10-CM | POA: Insufficient documentation

## 2015-05-17 DIAGNOSIS — I272 Other secondary pulmonary hypertension: Secondary | ICD-10-CM | POA: Diagnosis not present

## 2015-05-17 DIAGNOSIS — J449 Chronic obstructive pulmonary disease, unspecified: Secondary | ICD-10-CM | POA: Diagnosis not present

## 2015-05-17 DIAGNOSIS — I482 Chronic atrial fibrillation, unspecified: Secondary | ICD-10-CM

## 2015-05-17 DIAGNOSIS — Z8249 Family history of ischemic heart disease and other diseases of the circulatory system: Secondary | ICD-10-CM | POA: Insufficient documentation

## 2015-05-17 DIAGNOSIS — I5033 Acute on chronic diastolic (congestive) heart failure: Secondary | ICD-10-CM | POA: Insufficient documentation

## 2015-05-17 DIAGNOSIS — Z833 Family history of diabetes mellitus: Secondary | ICD-10-CM | POA: Insufficient documentation

## 2015-05-17 DIAGNOSIS — I480 Paroxysmal atrial fibrillation: Secondary | ICD-10-CM | POA: Insufficient documentation

## 2015-05-17 DIAGNOSIS — N184 Chronic kidney disease, stage 4 (severe): Secondary | ICD-10-CM | POA: Diagnosis not present

## 2015-05-17 DIAGNOSIS — Z794 Long term (current) use of insulin: Secondary | ICD-10-CM | POA: Insufficient documentation

## 2015-05-17 DIAGNOSIS — Z86718 Personal history of other venous thrombosis and embolism: Secondary | ICD-10-CM | POA: Diagnosis not present

## 2015-05-17 DIAGNOSIS — E039 Hypothyroidism, unspecified: Secondary | ICD-10-CM | POA: Insufficient documentation

## 2015-05-17 DIAGNOSIS — G4733 Obstructive sleep apnea (adult) (pediatric): Secondary | ICD-10-CM | POA: Diagnosis not present

## 2015-05-17 DIAGNOSIS — I5032 Chronic diastolic (congestive) heart failure: Secondary | ICD-10-CM

## 2015-05-17 DIAGNOSIS — Z8543 Personal history of malignant neoplasm of ovary: Secondary | ICD-10-CM | POA: Insufficient documentation

## 2015-05-17 DIAGNOSIS — Z7902 Long term (current) use of antithrombotics/antiplatelets: Secondary | ICD-10-CM | POA: Insufficient documentation

## 2015-05-17 DIAGNOSIS — Z79899 Other long term (current) drug therapy: Secondary | ICD-10-CM | POA: Diagnosis not present

## 2015-05-17 DIAGNOSIS — I129 Hypertensive chronic kidney disease with stage 1 through stage 4 chronic kidney disease, or unspecified chronic kidney disease: Secondary | ICD-10-CM | POA: Diagnosis not present

## 2015-05-17 LAB — BASIC METABOLIC PANEL
Anion gap: 8 (ref 5–15)
BUN: 30 mg/dL — AB (ref 6–20)
CALCIUM: 9.8 mg/dL (ref 8.9–10.3)
CO2: 29 mmol/L (ref 22–32)
CREATININE: 1.85 mg/dL — AB (ref 0.44–1.00)
Chloride: 102 mmol/L (ref 101–111)
GFR calc non Af Amer: 25 mL/min — ABNORMAL LOW (ref >60)
GFR, EST AFRICAN AMERICAN: 29 mL/min — AB (ref >60)
Glucose, Bld: 163 mg/dL — ABNORMAL HIGH (ref 65–99)
Potassium: 4.1 mmol/L (ref 3.5–5.1)
SODIUM: 139 mmol/L (ref 135–145)

## 2015-05-17 LAB — BRAIN NATRIURETIC PEPTIDE: B NATRIURETIC PEPTIDE 5: 1045 pg/mL — AB (ref 0.0–100.0)

## 2015-05-17 MED ORDER — AMLODIPINE BESYLATE 2.5 MG PO TABS: 2.5000 mg | ORAL_TABLET | Freq: Every day | ORAL | Status: DC

## 2015-05-17 NOTE — Patient Instructions (Signed)
Labs today  Your physician recommends that you schedule a follow-up appointment in: 6-8 weeks

## 2015-05-17 NOTE — Progress Notes (Signed)
Advanced Heart Failure Clinic Note   Patient ID: [name], female   DOB: October 14, 1933, 81 y.o.   MRN: [id number] PCP: Dr Annamaria Boots  Primary HF Cardiologist: Dr Vaughan Browner  HPI: [name] is a 82 y.o. female with history of chronic diastolic HF with RV dysfunction, OSA, COPD, pulmonary hypertension, chronic rate controlled A fib, type 2 diabetes.  She was admitted to [facility] on 03/16/15 when she presented with with worsening SOB, orthopnea, and PND. BNP 1109. CXR showing pulmonary congestion. Underwent 2d echo and right heart catheterization as below. Discharge weight 175, and diuretic regimen was decreased from Lasix 40 BID to 60 daily.  Admitted 03/16/15 with increased dyspnea. Diuresed with IV lasix and transitioned to lasix 40 mg daily. Has AKI noted on admit with creatinine up to 2.6. Creatinine down to 1.65 on the day of discharge. Discharge weight was 180 pounds.   She returns today for regular HF follow up.  She called on 04/28/15 with weight going up on 60 mg lasix BID and was told to increase to 80 am/60 pm.  Feels much better since then.  Weights at home 169 -170. Still has mild dyspnea with walking short distances (lobby to patient room). Sleeps chronically on 2 pillows. No presyncope/syncope. Uses 2 liters oxygen continuously. Still not on CPAP, sees Dr. Baird Lyons Thursday and will ask about sleep study. Lives at home with husband.  [place] 03/21/15 RA = 14 RV = 60/10/14 PA = 65/30 (43) PCW = 20 Fick cardiac output/index = 3.8/2.1 PVR = 6.1 WU Arterial sat = 94% PA sat = 55%, 56%  Echo 03/17/15 EF 60-65%, RV severely dilated with moderately reduced systolic function. PA peak pressure 68 mm Hg.  Labs 8/16: K 4.1 Creatinine 1.80 BNP 1496 9/16: K 4.3, Creatinine 2.15 BNP 1411  ROS: All systems negative except as listed in HPI, PMH and Problem List.  SH:  Social History   Social History  . Marital Status: Married    Spouse Name: N/A  . Number of Children: 2  .  Years of Education: N/A   Occupational History  . retired     Social History Main Topics  . Smoking status: Former Smoker    Quit date: 08/13/1978  . Smokeless tobacco: Never Used  . Alcohol Use: No  . Drug Use: No  . Sexual Activity: No   Other Topics Concern  . Not on file   Social History Narrative   Lives with husband       FH:  Family History  Problem Relation Age of Onset  . Pneumonia Mother   . Heart disease Mother   . Allergies Mother   . Emphysema Mother   . Arthritis Mother   . Hypertension Mother   . Heart attack Father   . Hypertension Father   . Diabetes Sister   . Heart disease Sister   . Diabetes Brother   . Diabetes      grandfather  . Breast cancer      aunt  . Bone cancer      aunt  . Clotting disorder Brother   . Arthritis Sister   . Breast cancer Maternal Aunt     Past Medical History  Diagnosis Date  . CAD (coronary artery disease)   . CHF (congestive heart failure)   . Paroxysmal atrial fibrillation   . Bradycardia   . Hypertension   . Renal artery stenosis   . COPD (chronic obstructive pulmonary disease)   . Obesity  hypoventilation syndrome   . Pruritus   . Tracheobronchitis   . Acute rhinosinusitis   . Anemia   . Hypoxemia   . Peripheral edema   . Obesity, endogenous   . Diabetes mellitus   . Acid reflux disease   . Osteoarthritis, generalized   . Hypothyroidism   . Lung nodule     lingula  . OSA (obstructive sleep apnea)     NPSG 04/03/00--AHI 28.hr  . DVT (deep venous thrombosis) 2003    left leg  . Vaginal atrophy   . Hx of colonic polyps   . Acute diastolic heart failure   . Secondary pulmonary hypertension 06/09/2013  . Blood transfusion without reported diagnosis 2009    had internal bleeding  . Cancer 1959    ovarian cancer  . Osteoporosis     Current Outpatient Prescriptions  Medication Sig Dispense Refill  . acetaminophen (TYLENOL) 500 MG tablet Take 1,000 mg by mouth every 6 (six) hours as needed.     Marland Kitchen albuterol (PROVENTIL HFA;VENTOLIN HFA) 108 (90 BASE) MCG/ACT inhaler Inhale 1 puff into the lungs every 4 (four) hours as needed for wheezing or shortness of breath.    Marland Kitchen albuterol (PROVENTIL) (2.5 MG/3ML) 0.083% nebulizer solution Take 3 mLs (2.5 mg total) by nebulization every 2 (two) hours as needed for wheezing or shortness of breath. 75 mL 3  . allopurinol (ZYLOPRIM) 100 MG tablet Take 1 tablet (100 mg total) by mouth daily.    Marland Kitchen amLODipine (NORVASC) 2.5 MG tablet Take 1 tablet (2.5 mg total) by mouth daily. 90 tablet 3  . apixaban (ELIQUIS) 5 MG TABS tablet Take 1 tablet (5 mg total) by mouth 2 (two) times daily. 180 tablet 0  . azelastine (ASTELIN) 137 MCG/SPRAY nasal spray 1-2 sprays each nostril once or twice daily 90 mL 3  . budesonide (PULMICORT) 0.25 MG/2ML nebulizer solution Take 2 mLs (0.25 mg total) by nebulization daily. 60 mL prn  . colchicine 0.6 MG tablet Take 1.2 mg by mouth daily as needed (for gout flareups).     . diltiazem (CARDIZEM CD) 120 MG 24 hr capsule Take 1 capsule (120 mg total) by mouth daily. 5 capsule 0  . furosemide (LASIX) 40 MG tablet Take 80 mg in AM and 60 mg in PM    . guaiFENesin (MUCINEX) 600 MG 12 hr tablet Take 600 mg by mouth daily.    . insulin glargine (LANTUS) 100 UNIT/ML injection Inject 20-35 Units into the skin 2 (two) times daily. Inject 20 units in the morning and 35 units in the evening    . insulin lispro (HUMALOG) 100 UNIT/ML injection Inject 2 Units into the skin 2 (two) times daily with breakfast and lunch. Per sliding scale. Patient unsure about how much she really takes    . isosorbide mononitrate (IMDUR) 30 MG 24 hr tablet Take 30 mg by mouth daily.    Marland Kitchen levothyroxine (SYNTHROID, LEVOTHROID) 25 MCG tablet Take 25 mcg by mouth See admin instructions. Take 1 tablet (25 mcg) on Monday, Wednesday, Friday    . levothyroxine (SYNTHROID, LEVOTHROID) 50 MCG tablet Take 50 mcg by mouth See admin instructions. Take 50mg  on Tues, Thurs, Sat and  sundays    . methocarbamol (ROBAXIN) 500 MG tablet Take 500 mg by mouth at bedtime.    . metoprolol (LOPRESSOR) 50 MG tablet Take 50 mg by mouth 2 (two) times daily.    . nitroGLYCERIN (NITROSTAT) 0.4 MG SL tablet Place 0.4 mg under the tongue every  5 (five) minutes as needed for chest pain.     Marland Kitchen oxybutynin (DITROPAN XL) 10 MG 24 hr tablet Take 10 mg by mouth daily.     . polyethylene glycol (MIRALAX / GLYCOLAX) packet Take 17 g by mouth daily. 14 each 0  . potassium chloride SA (KLOR-CON M20) 20 MEQ tablet Take 1 tablet (20 mEq total) by mouth daily. 90 tablet 3  . senna-docusate (SENOKOT-S) 8.6-50 MG per tablet Take 1 tablet by mouth at bedtime as needed for mild constipation. 20 tablet 0  . traMADol (ULTRAM) 50 MG tablet Take 50 mg by mouth every 6 (six) hours as needed for moderate pain.      No current facility-administered medications for this encounter.    Filed Vitals:   05/17/15 1009  BP: 96/58  Pulse: 70  Weight: 172 lb 8 oz (78.245 kg)  SpO2: 91%    PHYSICAL EXAM:  General:  Chronically ill appearing. No resp distress. HEENT: normal, On 2 liters [place].  Neck: supple. JVP 7 cm. Carotids 2+ bilaterally; no bruits. No lymphadenopathy or thyromegaly noted. Cor: PMI normal. RRR. No rubs, gallops or murmurs apreciated Lungs: CTA, slightly decreased bilateral bases with mild crackles L>R Abdomen: obese, soft, NT, ND. No HSM. No bruits or masses. +BS Extremities: no cyanosis, clubbing, rash. Trace edema Neuro: alert & orientedx3, cranial nerves grossly intact. Moves all 4 extremities w/o difficulty. Affect pleasant.    ASSESSMENT & PLAN: 1. Acute on chronic diastolic CHF/right heart failure : Echo 03/17/15 EF 60-65%, RV severely dilated with moderately reduced systolic function. - NYHA III. Red Vest Reading today is 43.(previously 46) Weight stable, down 1 lb from last visit.  - Volume status improved. Continue lasix 80 mg qam 60 mg qpm.  - Continue 20 meq K daily. - Has a  narrow euvolemic window (goal < 175 pounds). 172 lbs today. - Reinforced daily weights, limiting fluids twice a day, and low salt food choices.  - No uptitration of meds with soft bp. 2. Pulmonary Hypertension - PA peak pressure 68 mm Hg last echo. RHC PA 65/30 (43). Had [place]  03/2015 with PVR 6. Suspect mostly 2ndary [place]. Will cotninue to follow. Will not start pulmonary vasodilator at this point.  3. COPD - On chronic 2 liters 02 4. Paroxysmal atrial fibrillation, on Eliquis 5 mg twice daily. Rate controlled on metoprolol and cardizem. With creatinine > 1.5 will need to cut back to 2.5 bid when she turns 58.  5. H/o CAD s/p stenting of LAD 2008. 6. CKD stage IV - stable. Check BMET today 7. OSA- CPAP broken. [place] notified. Consider repeat sleep study. Declines pulmonary rehab.  Repeat BMET today.   Follow up in 6-8 weeks   Satira Mccallum T[place] PA-C 10:35 AM   Patient seen and examined with Oda Kilts, PA-C. We discussed all aspects of the encounter. I agree with the assessment and plan as stated above.   She is improved. Volume status looks better on higher dose lasix. Will continue. REDs reading chronically elevated likely due to COPD. Check BMET today. Will need to decrease Eliquis dose when she hits 81 y/o. Refuses CPAP or Pulmonary Rehab. I do not think there is a clear role for selective pulmonary vasodilators at this point.   Olivene [place],MD 11:30 AM

## 2015-05-19 ENCOUNTER — Ambulatory Visit (INDEPENDENT_AMBULATORY_CARE_PROVIDER_SITE_OTHER): Payer: Medicare Other | Admitting: Internal Medicine

## 2015-05-19 ENCOUNTER — Encounter: Payer: Self-pay | Admitting: Internal Medicine

## 2015-05-19 VITALS — BP 138/64 | HR 62 | Ht 61.0 in | Wt 172.4 lb

## 2015-05-19 DIAGNOSIS — G4733 Obstructive sleep apnea (adult) (pediatric): Secondary | ICD-10-CM | POA: Diagnosis not present

## 2015-05-19 DIAGNOSIS — I639 Cerebral infarction, unspecified: Secondary | ICD-10-CM

## 2015-05-19 DIAGNOSIS — J9611 Chronic respiratory failure with hypoxia: Secondary | ICD-10-CM | POA: Diagnosis not present

## 2015-05-19 NOTE — Patient Instructions (Addendum)
Order- Schedule NPSG for CPAP/ BIPAP titration on 2L O2  For dx OSA and chronic respiratory failure with hypoxia

## 2015-05-19 NOTE — Progress Notes (Signed)
Patient ID: [name], female    DOB: 08/11/1934, 81 y.o.   MRN: [id number]   04/23/13-  81 yo former smoker followed for OSA, OHS, occasional tracheobronchitis.Complicated by CAD/AFib, morbid obesity CPAP 16/O2 2 L./ [facility] Weight has been stable around the 192 pounds this year. She is comfortable with her CPAP plus oxygen and is sleeping pretty well. Using a rolling walker. Recently treated for heart failure. Her PCP did CXR/ [facility],.  11/05/13-81 yo former smoker followed for OSA, OHS, occasional tracheobronchitis, COPD/respiratory failure .Complicated by CAD/AFib/ CHF, morbid obesity, GERD, DM CPAP 16/ - quit.    O2 2 L./ [facility] FOLLOWS FOR:  Not wearing CPAP since October-- Reports breathing doing well. Weight down from 215 in 2010 to 163 lbs now Hospitalized in October with pneumonia, then 3 times for congestive heart failure. Continues oxygen 2 L Using Astelin nasal spray without need for rescue inhaler or active bronchodilators. Doing nebulized budesonide once daily  Acute- Dr Gwenette Greet HPI Patient comes in today for an acute sick visit. She is normally followed by Dr. Annamaria Boots for chronic respiratory failure, as well as obesity hypoventilation syndrome. It is unclear if she has underlying obstructive lung disease. She gives a 2 day history of increasing chest congestion, cough with purulent mucus, and some increased shortness of breath. She has had subjective fevers, and a neighbor who is a nurse has told her that her lungs sound abnormal.   05/13/14- 81 yo former smoker followed for OSA, OHS, occasional tracheobronchitis, COPD/respiratory failure .Complicated by CAD/AFib/ CHF, morbid obesity, GERD, DM CPAP 16/ - quit.    O2 2 L./ [facility] FOLLOWS FOR:Has trouble sleeping at times,sob occas.,cough-yellow and white,wheezing,denies cp or tightness,pnd,stuffy nose in am,not using CPAP   11/15/14- 81 yo former smoker followed for OSA/ /quit CPAP, OHS, occasional tracheobronchitis,  COPD/respiratory failure .Complicated by CAD/AFib/ CHF, morbid obesity, GERD, DM CPAP 16/ - quit.    O2 2 L./ Advanced FOLLOWS FOR: . Pt reports having some breathing issues with the pollen - SOB.  Acute illness last week- malaise, fever, n&V, diarrhea, head congestion, clear mucus. Improving.  02/10/15- 81 yo former smoker followed for OSA/ /quit CPAP, OHS, occasional tracheobronchitis, COPD/respiratory failure .Complicated by CAD/AFib/ CHF, morbid obesity, GERD, DM CPAP 16/ - quit.    O2 3 L./ Advanced FOLLOWS FOR Pt c/o productive cough with yellow green mucus, increased SOB, chest tightness, wheezing, and bilateral lower ext edema x 3 weeks. Pt states finished Augmentin 11 days ago.   05/19/15- 81 yo former smoker followed for OSA/ /quit CPAP, OHS, occasional tracheobronchitis, COPD/respiratory failure .Complicated by CAD/AFib/ CHF, morbid obesity, GERD, DM O2 2 L/Advanced FOLLOWS FOR Pt states that breathing is better since last visit. Pt denies cough, SOB, wheezing, chest tightness, chest pain or edema. Pt states recent hospiitalization 03/2015 and spent 15 days inpatient.  Hospitalized in August with acute on chronic renal failure, diastolic CHF, atrial fibrillation. Has had flu shot. She had stopped CPAP when she lost weight and now just uses oxygen 24/72 L/Advanced. During hospital stay she was told she should restart CPAP because of her heart. She asks about trying BiPAP because she didn't tolerate CPAP well and a friend told her "BiPAP was better". Educational discussion done. CXR 03/28/15 RESSION: No active cardiopulmonary disease. Stable moderate compression fracture over the lower thoracic spine with stable loss of height of several mid to upper thoracic vertebral bodies. Electronically Signed  By: Marin Olp M.D.  On: 03/28/2015 19:33  Review of Systems-see HPI  Constitutional:   No-   Unexpected weight loss, no-night sweats, fevers, chills, fatigue, lassitude. HEENT:   No-   headaches, difficulty swallowing, tooth/dental problems, sore throat,       No-  sneezing, itching, ear ache, +nasal congestion, post nasal drip,  CV:  No-  anginal chest pain, orthopnea, PND, swelling in lower extremities, anasarca, dizziness, palpitations Resp: +  shortness of breath with exertion or at rest.            productive cough,  No non-productive cough,  No- coughing up of blood.                change in color of mucus.  No- wheezing.   Skin: no-rash GI:  +HPI GU:  MS:  No-   joint pain or swelling.   Neuro-     nothing unusual Psych:  No- change in mood or affect. No depression or anxiety.  No memory loss.   Objective:   Physical Exam General- Alert, Oriented, Affect-appropriate, Distress- none acute, still quite obese, O2 2L/M, using a walker Skin- rash-none, lesions- none, excoriation- none Lymphadenopathy- none Head- atraumatic            Eyes- Gross vision intact, PERRLA, conjunctivae clear secretions, not injected            Ears- Hearing, canals-normal            Nose- +stuffy, no-Septal dev, mucus, polyps, erosion, perforation             Throat- Mallampati III-IV , mucosa clear , drainage- none, tonsils- atrophic Neck- flexible , trachea midline, no stridor , thyroid nl, carotid no bruit Chest - symmetrical excursion , unlabored           Heart/CV- IRR , no murmur , no gallop  , no rub, nl s1 s2                           - JVD- none , edema + trace edema, stasis changes- none, varices- none           Lung-, wheeze- none, cough , dullness-none, rub- none           Chest wall-  Abd-  Br/ Gen/ Rectal- Not done, not indicated Extrem- cyanosis- none, clubbing, none, atrophy- none, strength- nl, + dusky feet Neuro- grossly intact to observation

## 2015-05-21 NOTE — Assessment & Plan Note (Signed)
Remains dependent on continuous oxygen 2 L because of obesity hypoventilation, COPD, diastolic congestive heart failure

## 2015-05-21 NOTE — Assessment & Plan Note (Signed)
She has regained weight. I think the original sleep study is still valid. Plan-CPAP/BiPAP titration study to establish appropriate settings. Weight loss advised.

## 2015-05-23 ENCOUNTER — Ambulatory Visit (HOSPITAL_BASED_OUTPATIENT_CLINIC_OR_DEPARTMENT_OTHER): Payer: Medicare Other | Attending: Internal Medicine

## 2015-05-23 DIAGNOSIS — I4891 Unspecified atrial fibrillation: Secondary | ICD-10-CM | POA: Insufficient documentation

## 2015-05-23 DIAGNOSIS — G4730 Sleep apnea, unspecified: Secondary | ICD-10-CM | POA: Diagnosis present

## 2015-05-23 DIAGNOSIS — G4733 Obstructive sleep apnea (adult) (pediatric): Secondary | ICD-10-CM | POA: Insufficient documentation

## 2015-05-23 DIAGNOSIS — G4761 Periodic limb movement disorder: Secondary | ICD-10-CM | POA: Insufficient documentation

## 2015-05-23 DIAGNOSIS — J9611 Chronic respiratory failure with hypoxia: Secondary | ICD-10-CM

## 2015-05-27 ENCOUNTER — Telehealth: Payer: Self-pay | Admitting: Internal Medicine

## 2015-05-27 ENCOUNTER — Telehealth (HOSPITAL_COMMUNITY): Payer: Self-pay

## 2015-05-27 MED ORDER — PREDNISONE 10 MG PO TABS: ORAL_TABLET | ORAL | Status: DC

## 2015-05-27 NOTE — Telephone Encounter (Signed)
Also called Dr. Roosevelt Locks, this am and they instructed her to call our office.  She states we can call her back or call the patient back.

## 2015-05-27 NOTE — Telephone Encounter (Signed)
Called spoke with Donita from [facility]. Had visit today with pt. C/o prod cough (yellow phlem), ronchi in lung bases, increase SOB w/ exertion, nausea x few days. No edema. Vitals are stable. CDY out and will forward to DOD. Please advise Dr. Melvyn Novas thanks

## 2015-05-27 NOTE — Telephone Encounter (Signed)
With the sputum, wheezing and cough, would see if she can get in with pulmonary next week.

## 2015-05-27 NOTE — Telephone Encounter (Signed)
Prednisone 10 mg take  4 each am x 2 days,   2 each am x 2 days,  1 each am x 2 days and stop   Note allergies to most abx so needs ov if not better by 10/17 and if worse in meantime go to er

## 2015-05-27 NOTE — Telephone Encounter (Signed)
Spoke with pt and is aware of recs. RX sent in. Nothing further needed 

## 2015-05-27 NOTE — Telephone Encounter (Signed)
Home health nurse [place] called after visiting patient this afternoon.  Patients weight remains 168.4.  No swelling of ankles legs or abdomen.  Woke this morning with some wheezing; none now. Patient said that it has been quite along time since waking with wheezing.  C/o chest congestion coughing up thick yellow.  Has had nausea all week with feeling more short of breath.  Takes breathing treatment once a day.  Has not had one today. Saw Dr. Annamaria Boots, [place], on 10/6.  No chills or fever.  Hear breathing with talking.  Is talking in complete sentences without taking breath.  Asked Danita to call Dr Annamaria Boots for pulmonary guidance.  Will route to Dr. Haroldine Laws and Dr. Aundra Dubin

## 2015-06-01 NOTE — Telephone Encounter (Signed)
[name]- please see when she could see me or TP per this phone request

## 2015-06-02 NOTE — Telephone Encounter (Signed)
Pt has been scheduled to see TP on Tuesday 06-07-15 at 3:45pm. Nothing more needed at this time.

## 2015-06-04 ENCOUNTER — Encounter (HOSPITAL_BASED_OUTPATIENT_CLINIC_OR_DEPARTMENT_OTHER): Payer: Medicare Other | Admitting: Internal Medicine

## 2015-06-04 DIAGNOSIS — G4733 Obstructive sleep apnea (adult) (pediatric): Secondary | ICD-10-CM

## 2015-06-04 DIAGNOSIS — J9611 Chronic respiratory failure with hypoxia: Secondary | ICD-10-CM | POA: Diagnosis not present

## 2015-06-04 NOTE — Progress Notes (Signed)
  Patient Name: Terri, [name] Date: 05/23/2015 Gender: Female D.O.B: 1933/12/18 Age (years): 13 Referring Provider: Baird Lyons MD, ABSM Height (inches): 61.25 Interpreting Physician: Terri Lyons MD, ABSM Weight (lbs): 170 RPSGT: [name] BMI: 32 MRN: [id number] Neck Size: 13.50 CLINICAL INFORMATION The patient is referred for a CPAP titration to treat sleep apnea.   Date of NPSG, Split Night or HST: diagnostic NPSG 04/03/10  AHI 28/ hr, body weight 214 lbs  SLEEP STUDY TECHNIQUE As per the AASM Manual for the Scoring of Sleep and Associated Events v2.3 (April 2016) with a hypopnea requiring 4% desaturations. The channels recorded and monitored were frontal, central and occipital EEG, electrooculogram (EOG), submentalis EMG (chin), nasal and oral airflow, thoracic and abdominal wall motion, anterior tibialis EMG, snore microphone, electrocardiogram, and pulse oximetry. Continuous positive airway pressure (CPAP) was initiated at the beginning of the study and titrated to treat sleep-disordered breathing. MEDICATIONS Medications taken by the patient : charted for review Medications administered by patient during sleep study : benadryl  TECHNICIAN COMMENTS Comments added by technician: Patient had difficulty initiating sleep.  Comments added by scorer: N/A  RESPIRATORY PARAMETERS Optimal PAP Pressure (cm): 7 AHI at Optimal Pressure (/hr): 1.5 Overall Minimal O2 (%): 88.00 Supine % at Optimal Pressure (%): 0 Minimal O2 at Optimal Pressure (%): 88.0    SLEEP ARCHITECTURE The study was initiated at 10:37:11 PM and ended at 4:41:20 AM. Sleep onset time was 144.2 minutes and the sleep efficiency was 44.1%. The total sleep time was 160.5 minutes. The patient spent 3.43% of the night in stage N1 sleep, 89.72% in stage N2 sleep, 6.85% in stage N3 and 0.00% in REM.Stage REM latency was N/A minutes Wake after sleep onset was 59.5. Alpha intrusion was absent. Supine  sleep was 0.00%.  CARDIAC DATA The 2 lead EKG demonstrated atrial fibrillation. The mean heart rate was 73.93 beats per minute. Other EKG findings include: PVCs.  LEG MOVEMENT DATA The total Periodic Limb Movements of Sleep (PLMS) were 338. The PLMS index was 126.36. A PLMS index of <15 is considered normal in adults.  Her PML index with arousals was 11.6/ hr  IMPRESSIONS - The optimal PAP pressure was 7 cm of water. - Central sleep apnea was not noted during this titration (CAI = 0.0/h). - Moderate oxygen desaturations were observed during this titration (min O2 = 88.00%), with mean saturation 93.2% while wearing CPAP and supplemental O2 at 2L/m.. - No snoring was audible during this study. - 2-lead EKG demonstrated: atrial fibrillation - Severe periodic limb movements were observed during this study. Arousals associated with PLMs were significant.  DIAGNOSIS - Obstructive Sleep Apnea (327.23 [G47.33 ICD-10]) - Periodic Limb Movement Syndrome (327.51 [G47.61 ICD-10]) - Atrial fibrillation  RECOMMENDATIONS - Trial of CPAP therapy on 7 cm H2O with a Small size Resmed Nasal Pillow Mask AirFit P10 mask and heated humidification. - Continue supplemental O2 at 2L/ min - Avoid alcohol, sedatives and other CNS depressants that may worsen sleep apnea and disrupt normal sleep architecture. - Sleep hygiene should be reviewed to assess factors that may improve sleep quality. - Weight management and regular exercise should be initiated or continued.  [name] Diplomate, American Board of Sleep Medicine  ELECTRONICALLY SIGNED ON:  06/04/2015, 11:36 AM [place] PH: [phone]   FX: [phone] [place]

## 2015-06-06 ENCOUNTER — Telehealth: Payer: Self-pay | Admitting: Interventional Cardiology

## 2015-06-06 NOTE — Telephone Encounter (Signed)
Called to inform the pt that I have left her prescriptions at the front desk for her to pick up. No answer and her VM message stated that her mailbox was full.

## 2015-06-06 NOTE — Telephone Encounter (Signed)
New Message   STAT if patient is at the pharmacy , call can be transferred to refill team.   1. Which medications need to be refilled? Imdur 30 mg and Lopressor 50 Mg   2. Which pharmacy/location is medication to be sent to? [facility] (No number)   3. Do they need a 30 day or 90 day supply? 90 day supply 3 refills each   Comments: Pt states that she can pick up the scripts and take it to [facility]. She can pick it up tomorrow. Please assist

## 2015-06-06 NOTE — Telephone Encounter (Signed)
New Message STAT if patient is at the pharmacy , call can be transferred to refill team. 1. Which medications need to be refilled? Imdur 30 mg and Lopressor 50 Mg  2. Which pharmacy/location is medication to be sent to? [facility] (No number)  3. Do they need a 30 day or 90 day supply? 90 day supply 3 refills each  Comments: Pt states that she can pick up the scripts and take it to [facility]. She can pick it up tomorrow. Please assist   Please write up RX and leave up front for pt to pick up, thanks

## 2015-06-07 ENCOUNTER — Ambulatory Visit (INDEPENDENT_AMBULATORY_CARE_PROVIDER_SITE_OTHER): Payer: Medicare Other | Admitting: Adult Health

## 2015-06-07 ENCOUNTER — Encounter: Payer: Self-pay | Admitting: Adult Health

## 2015-06-07 VITALS — BP 114/78 | HR 63 | Temp 97.4°F | Ht 60.0 in | Wt 175.8 lb

## 2015-06-07 DIAGNOSIS — G4733 Obstructive sleep apnea (adult) (pediatric): Secondary | ICD-10-CM | POA: Diagnosis not present

## 2015-06-07 DIAGNOSIS — I639 Cerebral infarction, unspecified: Secondary | ICD-10-CM | POA: Diagnosis not present

## 2015-06-07 DIAGNOSIS — E669 Obesity, unspecified: Secondary | ICD-10-CM

## 2015-06-07 DIAGNOSIS — J9611 Chronic respiratory failure with hypoxia: Secondary | ICD-10-CM

## 2015-06-07 NOTE — Patient Instructions (Addendum)
Begin CPAP At bedtime  At 7 cm H20 with small mask .  Continue on Oxygen at 2l/m  CPAP Download in 1 month  Work on weight loss.  Wear CPAP for at least 6hr each night .  follow up Dr. Annamaria Boots  In 2 months and As needed

## 2015-06-08 NOTE — Assessment & Plan Note (Signed)
Moderate to Severe OSA CPAP titration shows good control at 7cm H20 pressure  Plan  Begin CPAP At bedtime  At 7 cm H20 with small mask .  Continue on Oxygen at 2l/m  CPAP Download in 1 month  Work on weight loss.  Wear CPAP for at least 6hr each night .  follow up Dr. Annamaria Boots  In 2 months and As needed

## 2015-06-08 NOTE — Assessment & Plan Note (Signed)
Continue on Oxygen at 2l/m  follow up Dr. Annamaria Boots  In 2 months and As needed

## 2015-06-08 NOTE — Assessment & Plan Note (Signed)
Encouraged on wt loss  

## 2015-06-08 NOTE — Progress Notes (Signed)
   Subjective:    Patient ID: [name], female    DOB: [birth year], 81 y.o.   MRN: [id number]  HPI 81 yo female with morbid obesity , chronic D-CHF , OSA, and COPD, Pulmonary HTN on O2   06/07/15 Follow up : OSA and Bronchitis  Patient returns for a two-week follow-up. She was recently seen for sleep apnea. Patient previously been on C Pap machine for her underlying moderate to severe sleep apnea . Unfortunately, she had stopped using her C Pap . Patient was set up for a C Pap titration study on October 10 that showed optimal pressure at 7 cm of water. She did need 2 L of oxygen to keep O2 saturations above 90%. He was significant periodic limb movements noted during the study.  We discussed these results and patient is acceptable to restart her C Pap machine. Patient has recently had bronchitic symptoms. He was sent a prednisone taper . She has helped. 2. Has some lingering cough minimally productive. Denies any fever, chest pain, orthopnea, PND, or increased leg swelling.   Review of Systems Constitutional:   No  weight loss, night sweats,  Fevers, chills,  +fatigue, or  lassitude.  HEENT:   No headaches,  Difficulty swallowing,  Tooth/dental problems, or  Sore throat,                No sneezing, itching, ear ache, nasal congestion, post nasal drip,   CV:  No chest pain,  Orthopnea, PND, swelling in lower extremities, anasarca, dizziness, palpitations, syncope.   GI  No heartburn, indigestion, abdominal pain, nausea, vomiting, diarrhea, change in bowel habits, loss of appetite, bloody stools.   Resp:   No chest wall deformity  Skin: no rash or lesions.  GU: no dysuria, change in color of urine, no urgency or frequency.  No flank pain, no hematuria   MS:  No joint pain or swelling.  No decreased range of motion.  No back pain.  Psych:  No change in mood or affect. No depression or anxiety.  No memory loss.         Objective:   Physical Exam GEN: A/Ox3; pleasant  , NAD, Obese  HEENT:  [place]/AT,  EACs-clear, TMs-wnl, NOSE-clear, THROAT-clear, no lesions, no postnasal drip or exudate noted.   NECK:  Supple w/ fair ROM; no JVD; normal carotid impulses w/o bruits; no thyromegaly or nodules palpated; no lymphadenopathy.  RESP  decreased breath sounds in the bases .no accessory muscle use, no dullness to percussion  CARD:  RRR, no m/r/g  , trace peripheral edema, pulses intact, no cyanosis or clubbing.  GI:   Soft & nt; nml bowel sounds; no organomegaly or masses detected.  Musco: Warm bil, no deformities or joint swelling noted.   Neuro: alert, no focal deficits noted.    Skin: Warm, no lesions or rashes         Assessment & Plan:

## 2015-06-09 ENCOUNTER — Telehealth: Payer: Self-pay | Admitting: Internal Medicine

## 2015-06-09 DIAGNOSIS — G4733 Obstructive sleep apnea (adult) (pediatric): Secondary | ICD-10-CM

## 2015-06-09 NOTE — Telephone Encounter (Signed)
ATC x 3- fast busy signal, WCB

## 2015-06-10 ENCOUNTER — Telehealth (HOSPITAL_COMMUNITY): Payer: Self-pay

## 2015-06-10 NOTE — Telephone Encounter (Signed)
Patient called on request of [facility] to let Dr. Haroldine Laws know that she has gained 5 ls in the past week Is taking Lasix 80 mg in AM and 60 mg in PM Patient denies any weakness, chest pain or shortness of breath, no swelling  Creatinine levels improved 3 weeks ago Is taking Klorn con 20 meq daily Is going to increase her Lasix for 3 days to 80mg  in AM and 80 mg in [place] nurse will be out to see her this evening to evaluate

## 2015-06-10 NOTE — Telephone Encounter (Signed)
Per TP setting should be 7cm H20 with a small mask

## 2015-06-10 NOTE — Telephone Encounter (Signed)
Spoke with [name] and made aware of below. New order needs to be placed. Also [name] states order placed was for new CPAP but pt already on CPAP. Are we wanting to replace her current CPAP? If so we need to state that in the order. Please advise TP and I can place order thanks

## 2015-06-10 NOTE — Telephone Encounter (Signed)
Per 06/07/15 OV w/ TP: Patient Instructions     Begin CPAP At bedtime At 7 cm H20 with small mask .  Continue on Oxygen at 2l/m  CPAP Download in 1 month  Work on weight loss.  Wear CPAP for at least 6hr each night .  follow up Dr. Annamaria Boots In 2 months and As needed   ---   The order that was placed states place on auto setting 5-15 cm Amanda please clarify if pt needs auto or set pressure? thanks

## 2015-06-13 ENCOUNTER — Ambulatory Visit: Payer: Medicare Other | Admitting: Neurology

## 2015-06-13 ENCOUNTER — Ambulatory Visit (INDEPENDENT_AMBULATORY_CARE_PROVIDER_SITE_OTHER): Payer: Medicare Other | Admitting: Neurology

## 2015-06-13 ENCOUNTER — Encounter: Payer: Self-pay | Admitting: Neurology

## 2015-06-13 VITALS — BP 128/81 | HR 52 | Ht 61.0 in | Wt 176.2 lb

## 2015-06-13 DIAGNOSIS — I639 Cerebral infarction, unspecified: Secondary | ICD-10-CM

## 2015-06-13 DIAGNOSIS — I482 Chronic atrial fibrillation, unspecified: Secondary | ICD-10-CM

## 2015-06-13 DIAGNOSIS — Z7901 Long term (current) use of anticoagulants: Secondary | ICD-10-CM | POA: Diagnosis not present

## 2015-06-13 DIAGNOSIS — E1159 Type 2 diabetes mellitus with other circulatory complications: Secondary | ICD-10-CM | POA: Diagnosis not present

## 2015-06-13 DIAGNOSIS — G451 Carotid artery syndrome (hemispheric): Secondary | ICD-10-CM | POA: Diagnosis not present

## 2015-06-13 DIAGNOSIS — I10 Essential (primary) hypertension: Secondary | ICD-10-CM | POA: Diagnosis not present

## 2015-06-13 NOTE — Telephone Encounter (Signed)
TP - please advise. Thanks. 

## 2015-06-13 NOTE — Patient Instructions (Addendum)
-   continue eliquis and crestor for stroke prevention - check BP and glucose at home - continue to follow up with cardiology for CHF and afib. - discuss with Dr. Addison Lank to check lipid panel and A1C next visit  - Follow up with your primary care physician for stroke risk factor modification. Recommend maintain blood pressure goal <130/80, diabetes with hemoglobin A1c goal below 6.5% and lipids with LDL cholesterol goal below 70 mg/dL.  - follow up with Dr. Annamaria Boots for COPD and O2 therapy - follow up in 6 months.

## 2015-06-13 NOTE — Progress Notes (Signed)
STROKE NEUROLOGY FOLLOW UP NOTE  NAME: [name] DOB: Sep 03, 1933  REASON FOR VISIT: stroke follow up HISTORY FROM: pt and chart  Today we had the pleasure of seeing [name] in follow-up at our Neurology Clinic. Pt was accompanied by husband.   History Summary [name] is an 81 y.o. female with a history of atrial fibrillation not on AC, DM, obesity, OSA, previous DVT, COPD and pulmonary hypertension on home O2 was admitted on 12/04/14 due to transient expressive aphasia. She denied any weakness.Initial NIHSS = 5. MRI no infarct. LDL 211 and A1C 7.3. She was put on eliquis. Due to intolerance, she was advised for consideration of PCSK9 inhibitors. She was discharged in good condition.   03/03/15 follow up - the patient has been doing well from stroke standpoint. No recurrent symptoms. However, she does have increased SOB and O2 from 2L/m increased to 3L/m. She has recent infection and was put on Abx. Her crestor was resumed and she said the muscle pain is tolerable. BP today 132/71.    Interval History During the interval time,  Pt has been doing well, no recurrent stroke like symptoms. She is still on portable O2 therapy. Stated that her glucose in control and at home 153, however, her A1C in 03/2014 was 9.5. LDL 211 and on crestor 20mg . Continued on eliquis, cardizem and metoprolol for afib. BP today 128/81.   REVIEW OF SYSTEMS: Full 14 system review of systems performed and notable only for those listed below and in HPI above, all others are negative:  Constitutional:   Cardiovascular:  Ear/Nose/Throat:  Ringing in ears Skin: itching Eyes:   Respiratory:   Gastroitestinal:  Constipation, nausea Genitourinary:  Hematology/Lymphatic:   Endocrine:  Musculoskeletal:  aching muscles Allergy/Immunology:   Neurological:   Psychiatric:  Sleep: apnea  The following represents the patient's updated allergies and side effects list: Allergies    Allergen Reactions  . Cefuroxime Axetil Shortness Of Breath, Swelling and Rash    Throat swelling  . Cephalexin Shortness Of Breath, Swelling and Rash    Throat swelling  . Clarithromycin Shortness Of Breath, Swelling and Rash    Throat swelling  . Doxycycline Shortness Of Breath, Swelling and Rash     rash on legs and feet, throat swelling  . Erythromycin Shortness Of Breath, Swelling and Rash    Throat swelling  . Tetracycline Shortness Of Breath, Swelling and Rash    Throat swelling    The neurologically relevant items on the patient's problem list were reviewed on today's visit.  Neurologic Examination  A problem focused neurological exam (12 or more points of the single system neurologic examination, vital signs counts as 1 point, cranial nerves count for 8 points) was performed.  Blood pressure 128/81, pulse 52, height 5\' 1"  (1.549 m), weight 176 lb 3.2 oz (79.924 kg), last menstrual period 08/13/1957, SpO2 95 %.  General - Well nourished, well developed, in mild respiratory distress, on home O2.  Ophthalmologic - Fundi not visualized due to noncorporation due to SOB.  Cardiovascular - irregularly irregular heart rate and rhythm.  Mental Status -  Level of arousal and orientation to time, place, and person were intact. Language including expression, naming, repetition, comprehension was assessed and found intact. Fund of Knowledge was assessed and was intact.  Cranial Nerves II - XII - II - Visual field intact OU. III, IV, VI - Extraocular movements intact. V - Facial sensation intact bilaterally. VII - Facial movement intact bilaterally.  VIII - Hearing & vestibular intact bilaterally. X - Palate elevates symmetrically. XI - Chin turning & shoulder shrug intact bilaterally. XII - Tongue protrusion intact.  Motor Strength - The patient's strength was normal in all extremities and pronator drift was absent.  Bulk was normal and fasciculations were absent.   Motor  Tone - Muscle tone was assessed at the neck and appendages and was normal.  Reflexes - The patient's reflexes were 1+ in all extremities and she had no pathological reflexes.  Sensory - Light touch, temperature/pinprick, vibration and proprioception, and Romberg testing were assessed and were normal.    Coordination - The patient had normal movements in the hands and feet with no ataxia or dysmetria.  Tremor was absent.  Gait and Station - in wheelchair, not tested.  Data reviewed: I personally reviewed the images and agree with the radiology interpretations.  Ct Head (brain) Wo Contrast 12/04/2014  No definite acute cortical infarction. Chronic changes as described. Possible hyperattenuating LEFT middle cerebral artery M3 branch or branches could be indicative of distal emboli.   MRI HEAD IMPRESSION:  12/05/2014 1. No acute intracranial infarct or other abnormality identified.  2. Generalized cerebral atrophy with moderate chronic microvascular ischemic disease.   MRA HEAD IMPRESSION:  12/05/2014 1. No proximal branch occlusion or hemodynamically significant stenosis identified within the intracranial circulation.  2. Nonvisualization of the left vertebral artery, which may be hypoplastic or occluded in the neck. Dominant right vertebral artery widely patent.  3. Fetal origin of the right PCA.   Carotid Ultrasound - Bilateral 1-39% ICA stenosis, borderline >40%. Calcific plaque is present at bilateral origin and velocities may be underestimated. Bilateral ECA stenosis. Right vertebral patent with antegrade flow. Left vertebral is atypical with loss of diastolic component, suggesting a distal obstruction.  2D echo - Normal LV function; moderate LVH; moderate LAE; mild AI; mild TR.  Component     Latest Ref Rng 12/05/2014  Cholesterol     0 - 200 mg/dL 291 (H)  Triglycerides     <150 mg/dL 259 (H)  HDL Cholesterol     >39 mg/dL 28 (L)  Total CHOL/HDL Ratio       10.4  VLDL     0 - 40 mg/dL 52 (H)  LDL (calc)     0 - 99 mg/dL 211 (H)  Hemoglobin A1C     4.8 - 5.6 % 7.3 (H)  Mean Plasma Glucose      163    Assessment: As you may recall, she is a 81 y.o. Caucasian female with PMH of afib not on AC, DM, obesity, OSA, previous DVT, and pulmonary hypertension on home O2 was admitted on 12/04/14 due to transient expressive aphasia. MRI no infarct. LDL 211 and A1C 7.3. She was put on eliquis and crestor on discharge. She is tolerating meds well. Still has SOB on home O2 due to PHTN, COPD. Aggressive risk factor control. Last A1C in 03/2015 was 9.5. BP in good control. No side effect from eliquis.  Plan:  - continue eliquis and crestor for stroke prevention - check BP and glucose at home - continue to follow up with cardiology for CHF and afib. - discuss with Dr. Addison Lank to check lipid panel and A1C next visit  - Follow up with your primary care physician for stroke risk factor modification. Recommend maintain blood pressure goal <130/80, diabetes with hemoglobin A1c goal below 6.5% and lipids with LDL cholesterol goal below 70 mg/dL.  - follow up with  Dr. Annamaria Boots for COPD and O2 therapy - follow up in 6 months.   I spent more than 25 minutes of face to face time with the patient. Greater than 50% of time was spent in counseling and coordination of care.    No orders of the defined types were placed in this encounter.    Meds ordered this encounter  Medications  . CRESTOR 20 MG tablet    Sig:   . calcitRIOL (ROCALTROL) 0.25 MCG capsule    Sig:     Patient Instructions  - continue eliquis and crestor for stroke prevention - check BP and glucose at home - continue to follow up with cardiology for CHF and afib. - discuss with Dr. Addison Lank to check lipid panel and A1C next visit  - Follow up with your primary care physician for stroke risk factor modification. Recommend maintain blood pressure goal <130/80, diabetes with hemoglobin A1c goal below  6.5% and lipids with LDL cholesterol goal below 70 mg/dL.  - follow up with Dr. Annamaria Boots for COPD and O2 therapy - follow up in 6 months.     Rosalin Hawking, MD PhD [facility] [address] [place], [place] [address] [phone]

## 2015-06-13 NOTE — Telephone Encounter (Signed)
Per TP Verify if pt has CPAP machine available at home now.   Called and LVM for pt to return call. Need to verify if she has a CPAP or if we need to place a complete new order.

## 2015-06-14 ENCOUNTER — Telehealth (HOSPITAL_COMMUNITY): Payer: Self-pay

## 2015-06-14 NOTE — Telephone Encounter (Signed)
Informed [facility]N [phone]) and patient that she has an appointment with Dr. Haroldine Laws Thursday at 140pm

## 2015-06-14 NOTE — Telephone Encounter (Signed)
Wrong record opened

## 2015-06-14 NOTE — Telephone Encounter (Signed)
Per TP Okay to place new order for new CPAP machine with 7cm H2O with small mask per 06/07/15 ov with TP Download in 1 month DME: [facility] Enroll in [place] was placed with exact pressure per 06/07/15 ov for a new CPAP machine.  Nothing further needed Will sign off on message.

## 2015-06-14 NOTE — Telephone Encounter (Signed)
Pt cb, states she has not received cpap or heard anything to get set up with this yet, [phone]

## 2015-06-14 NOTE — Telephone Encounter (Signed)
Milam called with up date on patient:  Lasix increased from 80 mg AM 60 mg PM to 80mg  and 80 mg on 10/28 for three days.  Remains on 80mg  and 80 mg  No weight loss from the 6.5 lb weight gain. Weight on Friday 172.5.  Weight today 11/01 172.2 Lungs decreased aeration, clear Ankle without change measurement Saw Neurologist today, with moderate walk to office O2 saturation 80's % Urine out put low. Patient complains of feeling very tired out  Bensimhon out routed to Dr. Aundra Dubin for advise

## 2015-06-14 NOTE — Telephone Encounter (Signed)
Called and spoke to patient, she says that she does not currently have a CPAP machine.  Order was entered for a new CPAP machine on 06/07/15, but patient has not heard anything from [facility] regarding this order.  Attempted to call Melissa, line was busy.  Sent staff message to [facility] to get update on order.  Awaiting response from [facility]... Will hold in triage

## 2015-06-14 NOTE — Telephone Encounter (Signed)
lmomtcb for pt 

## 2015-06-16 ENCOUNTER — Ambulatory Visit (HOSPITAL_COMMUNITY)
Admission: RE | Admit: 2015-06-16 | Discharge: 2015-06-16 | Disposition: A | Discharge status: Home / Self Care | Payer: Medicare Other | Source: Ambulatory Visit | Attending: Internal Medicine | Admitting: Internal Medicine

## 2015-06-16 ENCOUNTER — Encounter (HOSPITAL_COMMUNITY): Payer: Self-pay | Admitting: Internal Medicine

## 2015-06-16 VITALS — BP 132/78 | HR 72 | Wt 173.5 lb

## 2015-06-16 DIAGNOSIS — Z955 Presence of coronary angioplasty implant and graft: Secondary | ICD-10-CM | POA: Diagnosis not present

## 2015-06-16 DIAGNOSIS — I2510 Atherosclerotic heart disease of native coronary artery without angina pectoris: Secondary | ICD-10-CM | POA: Diagnosis not present

## 2015-06-16 DIAGNOSIS — Z8249 Family history of ischemic heart disease and other diseases of the circulatory system: Secondary | ICD-10-CM | POA: Diagnosis not present

## 2015-06-16 DIAGNOSIS — I5033 Acute on chronic diastolic (congestive) heart failure: Secondary | ICD-10-CM | POA: Diagnosis present

## 2015-06-16 DIAGNOSIS — I480 Paroxysmal atrial fibrillation: Secondary | ICD-10-CM | POA: Diagnosis not present

## 2015-06-16 DIAGNOSIS — I130 Hypertensive heart and chronic kidney disease with heart failure and stage 1 through stage 4 chronic kidney disease, or unspecified chronic kidney disease: Secondary | ICD-10-CM | POA: Insufficient documentation

## 2015-06-16 DIAGNOSIS — E662 Morbid (severe) obesity with alveolar hypoventilation: Secondary | ICD-10-CM | POA: Diagnosis not present

## 2015-06-16 DIAGNOSIS — I272 Other secondary pulmonary hypertension: Secondary | ICD-10-CM | POA: Insufficient documentation

## 2015-06-16 DIAGNOSIS — Z8673 Personal history of transient ischemic attack (TIA), and cerebral infarction without residual deficits: Secondary | ICD-10-CM | POA: Diagnosis not present

## 2015-06-16 DIAGNOSIS — Z7902 Long term (current) use of antithrombotics/antiplatelets: Secondary | ICD-10-CM | POA: Diagnosis not present

## 2015-06-16 DIAGNOSIS — E1122 Type 2 diabetes mellitus with diabetic chronic kidney disease: Secondary | ICD-10-CM | POA: Insufficient documentation

## 2015-06-16 DIAGNOSIS — N184 Chronic kidney disease, stage 4 (severe): Secondary | ICD-10-CM | POA: Diagnosis not present

## 2015-06-16 DIAGNOSIS — G4733 Obstructive sleep apnea (adult) (pediatric): Secondary | ICD-10-CM | POA: Insufficient documentation

## 2015-06-16 DIAGNOSIS — J449 Chronic obstructive pulmonary disease, unspecified: Secondary | ICD-10-CM | POA: Insufficient documentation

## 2015-06-16 DIAGNOSIS — Z833 Family history of diabetes mellitus: Secondary | ICD-10-CM | POA: Insufficient documentation

## 2015-06-16 DIAGNOSIS — Z9981 Dependence on supplemental oxygen: Secondary | ICD-10-CM | POA: Insufficient documentation

## 2015-06-16 DIAGNOSIS — Z87891 Personal history of nicotine dependence: Secondary | ICD-10-CM | POA: Diagnosis not present

## 2015-06-16 DIAGNOSIS — E039 Hypothyroidism, unspecified: Secondary | ICD-10-CM | POA: Insufficient documentation

## 2015-06-16 DIAGNOSIS — Z794 Long term (current) use of insulin: Secondary | ICD-10-CM | POA: Insufficient documentation

## 2015-06-16 DIAGNOSIS — Z79899 Other long term (current) drug therapy: Secondary | ICD-10-CM | POA: Diagnosis not present

## 2015-06-16 DIAGNOSIS — K219 Gastro-esophageal reflux disease without esophagitis: Secondary | ICD-10-CM | POA: Diagnosis not present

## 2015-06-16 DIAGNOSIS — Z8543 Personal history of malignant neoplasm of ovary: Secondary | ICD-10-CM | POA: Diagnosis not present

## 2015-06-16 DIAGNOSIS — Z86718 Personal history of other venous thrombosis and embolism: Secondary | ICD-10-CM | POA: Insufficient documentation

## 2015-06-16 LAB — BASIC METABOLIC PANEL
Anion gap: 9 (ref 5–15)
BUN: 28 mg/dL — AB (ref 6–20)
CO2: 32 mmol/L (ref 22–32)
CREATININE: 1.88 mg/dL — AB (ref 0.44–1.00)
Calcium: 9.4 mg/dL (ref 8.9–10.3)
Chloride: 99 mmol/L — ABNORMAL LOW (ref 101–111)
GFR calc Af Amer: 28 mL/min — ABNORMAL LOW (ref >60)
GFR, EST NON AFRICAN AMERICAN: 24 mL/min — AB (ref >60)
GLUCOSE: 62 mg/dL — AB (ref 65–99)
POTASSIUM: 3.8 mmol/L (ref 3.5–5.1)
SODIUM: 140 mmol/L (ref 135–145)

## 2015-06-16 MED ORDER — POTASSIUM CHLORIDE CRYS ER 20 MEQ PO TBCR: 20.0000 meq | EXTENDED_RELEASE_TABLET | Freq: Every day | ORAL | Status: DC

## 2015-06-16 MED ORDER — METOLAZONE 2.5 MG PO TABS: 2.5000 mg | ORAL_TABLET | ORAL | Status: DC

## 2015-06-16 NOTE — Addendum Note (Signed)
Encounter addended by: Scarlette Calico, RN on: 06/16/2015  2:28 PM<BR>     Documentation filed: Dx Association, Patient Instructions Section, Orders

## 2015-06-16 NOTE — Progress Notes (Signed)
Advanced Heart Failure Clinic Note   Patient ID: [name], female   DOB: [birth year], 81 y.o.   MRN: [id number] PCP: Dr Annamaria Boots  Primary HF Cardiologist: Dr Vaughan Browner  HPI: [name] is a 81 y.o. female with history of chronic diastolic HF with RV dysfunction, OSA, COPD, pulmonary hypertension, chronic rate controlled A fib, type 2 diabetes.  She was admitted to [facility] on 03/16/15 when she presented with with worsening SOB, orthopnea, and PND. BNP 1109. CXR showing pulmonary congestion. Underwent 2d echo and right heart catheterization as below. Discharge weight 175, and diuretic regimen was decreased from Lasix 40 BID to 60 daily.  Admitted 03/16/15 with increased dyspnea. Diuresed with IV lasix and transitioned to lasix 40 mg daily. Has AKI noted on admit with creatinine up to 2.6. Creatinine down to 1.65 on the day of discharge. Discharge weight was 180 pounds.   She returns today for unscheduled HF follow up due to worsening SOB.  Last month lasix increased to 80 am/60 pm and was feeling better. Last week weight was up 6 pounds and was more SOB. Lasix increased to 80 bid. Now feel some better weight down 3 pounds but still up about 3 pounds from baseline 167-169 pounds. Still has mild dyspnea with walking short distances (lobby to patient room). Sleeps chronically on 2 pillows. No presyncope/syncope. Uses 2 liters oxygen continuously. Picks up CPAP Wednesday. Denies drinking too much fluid or eating ice. Uses salt substitute.   [place] 03/21/15 RA = 14 RV = 60/10/14 PA = 65/30 (43) PCW = 20 Fick cardiac output/index = 3.8/2.1 PVR = 6.1 WU Arterial sat = 94% PA sat = 55%, 56%  Echo 03/17/15 EF 60-65%, RV severely dilated with moderately reduced systolic function. PA peak pressure 68 mm Hg.  Labs 8/16: K 4.1 Creatinine 1.80 BNP 1496 9/16: K 4.3, Creatinine 2.15 BNP 1411  ROS: All systems negative except as listed in HPI, PMH and Problem List.  SH:  Social History    Social History  . Marital Status: Married    Spouse Name: N/A  . Number of Children: 2  . Years of Education: N/A   Occupational History  . retired     Social History Main Topics  . Smoking status: Former Smoker    Quit date: 08/13/1978  . Smokeless tobacco: Never Used  . Alcohol Use: No  . Drug Use: No  . Sexual Activity: No   Other Topics Concern  . Not on file   Social History Narrative   Lives with husband       FH:  Family History  Problem Relation Age of Onset  . Pneumonia Mother   . Heart disease Mother   . Allergies Mother   . Emphysema Mother   . Arthritis Mother   . Hypertension Mother   . Heart attack Father   . Hypertension Father   . Diabetes Sister   . Heart disease Sister   . Diabetes Brother   . Diabetes      grandfather  . Breast cancer      aunt  . Bone cancer      aunt  . Clotting disorder Brother   . Arthritis Sister   . Breast cancer Maternal Aunt     Past Medical History  Diagnosis Date  . CAD (coronary artery disease)   . CHF (congestive heart failure) ([place])   . Paroxysmal atrial fibrillation (HCC)   . Bradycardia   . Hypertension   . Renal  artery stenosis ([place])   . COPD (chronic obstructive pulmonary disease) ([place])   . Obesity hypoventilation syndrome ([place])   . Pruritus   . Tracheobronchitis   . Acute rhinosinusitis   . Anemia   . Hypoxemia   . Peripheral edema   . Obesity, endogenous   . Diabetes mellitus   . Acid reflux disease   . Osteoarthritis, generalized   . Hypothyroidism   . Lung nodule     lingula  . OSA (obstructive sleep apnea)     NPSG 04/03/00--AHI 28.hr  . DVT (deep venous thrombosis) ([place]) 2003    left leg  . Vaginal atrophy   . Hx of colonic polyps   . Acute diastolic heart failure ([place])   . Secondary pulmonary hypertension ([place]) 06/09/2013  . Blood transfusion without reported diagnosis 2009    had internal bleeding  . Cancer ([place]) 1959    ovarian cancer  . Osteoporosis   . Stroke [facility])      Current Outpatient Prescriptions  Medication Sig Dispense Refill  . acetaminophen (TYLENOL) 500 MG tablet Take 1,000 mg by mouth every 6 (six) hours as needed.    Marland Kitchen albuterol (PROVENTIL HFA;VENTOLIN HFA) 108 (90 BASE) MCG/ACT inhaler Inhale 1 puff into the lungs every 4 (four) hours as needed for wheezing or shortness of breath.    Marland Kitchen albuterol (PROVENTIL) (2.5 MG/3ML) 0.083% nebulizer solution Take 3 mLs (2.5 mg total) by nebulization every 2 (two) hours as needed for wheezing or shortness of breath. 75 mL 3  . allopurinol (ZYLOPRIM) 100 MG tablet Take 1 tablet (100 mg total) by mouth daily.    Marland Kitchen amLODipine (NORVASC) 2.5 MG tablet Take 1 tablet (2.5 mg total) by mouth daily. 90 tablet 3  . azelastine (ASTELIN) 137 MCG/SPRAY nasal spray 1-2 sprays each nostril once or twice daily 90 mL 3  . budesonide (PULMICORT) 0.25 MG/2ML nebulizer solution Take 2 mLs (0.25 mg total) by nebulization daily. 60 mL prn  . calcitRIOL (ROCALTROL) 0.25 MCG capsule     . colchicine 0.6 MG tablet Take 1.2 mg by mouth daily as needed (for gout flareups).     . CRESTOR 20 MG tablet     . diltiazem (CARDIZEM CD) 120 MG 24 hr capsule Take 1 capsule (120 mg total) by mouth daily. 5 capsule 0  . furosemide (LASIX) 40 MG tablet Take 80 mg in AM and 80 mg in PM    . guaiFENesin (MUCINEX) 600 MG 12 hr tablet Take 600 mg by mouth daily.    . insulin glargine (LANTUS) 100 UNIT/ML injection Inject 20-35 Units into the skin 2 (two) times daily. Inject 20 units in the morning and 35 units in the evening    . insulin lispro (HUMALOG) 100 UNIT/ML injection Inject 2 Units into the skin 2 (two) times daily with breakfast and lunch. Per sliding scale. Patient unsure about how much she really takes    . isosorbide mononitrate (IMDUR) 30 MG 24 hr tablet Take 30 mg by mouth daily.    Marland Kitchen levothyroxine (SYNTHROID, LEVOTHROID) 25 MCG tablet Take 25 mcg by mouth See admin instructions. Take 1 tablet (25 mcg) on Monday, Wednesday, Friday     . levothyroxine (SYNTHROID, LEVOTHROID) 50 MCG tablet Take 50 mcg by mouth See admin instructions. Take 50mg  on Tues, Thurs, Sat and sundays    . methocarbamol (ROBAXIN) 500 MG tablet Take 500 mg by mouth at bedtime.    . metoprolol (LOPRESSOR) 50 MG tablet Take 50 mg by  mouth 2 (two) times daily.    Marland Kitchen oxybutynin (DITROPAN XL) 10 MG 24 hr tablet Take 10 mg by mouth daily.     . polyethylene glycol (MIRALAX / GLYCOLAX) packet Take 17 g by mouth daily. 14 each 0  . potassium chloride SA (KLOR-CON M20) 20 MEQ tablet Take 1 tablet (20 mEq total) by mouth daily. 90 tablet 3  . senna-docusate (SENOKOT-S) 8.6-50 MG per tablet Take 1 tablet by mouth at bedtime as needed for mild constipation. 20 tablet 0  . traMADol (ULTRAM) 50 MG tablet Take 50 mg by mouth every 6 (six) hours as needed for moderate pain.     Marland Kitchen apixaban (ELIQUIS) 5 MG TABS tablet Take 1 tablet (5 mg total) by mouth 2 (two) times daily. 180 tablet 0  . nitroGLYCERIN (NITROSTAT) 0.4 MG SL tablet Place 0.4 mg under the tongue every 5 (five) minutes as needed for chest pain.      No current facility-administered medications for this encounter.    Filed Vitals:   06/16/15 1339  BP: 132/78  Pulse: 72  Weight: 173 lb 8 oz (78.699 kg)  SpO2: 93%    PHYSICAL EXAM:  General:  Chronically ill appearing. No resp distress. HEENT: normal, On 2 liters [employer].  Neck: supple. JVP jaw cm. Carotids 2+ bilaterally; no bruits. No lymphadenopathy or thyromegaly noted. Cor: PMI normal. RRR. No rubs, gallops or murmurs apreciated Lungs: CTA, slightly decreased bilateral bases with mild crackles L>R Abdomen: obese, soft, NT, ND. No HSM. No bruits or masses. +BS Extremities: no cyanosis, clubbing, rash. 1-2+ edema Neuro: alert & orientedx3, cranial nerves grossly intact. Moves all 4 extremities w/o difficulty. Affect pleasant.    ASSESSMENT & PLAN: 1. Acute on chronic diastolic CHF/right heart failure : Echo 03/17/15 EF 60-65%, RV severely dilated  with moderately reduced systolic function. - NYHA III-IIIb volume status up again. Mildly improved with increased lasix but not back to baseline.  - Will continue lasix 80 bid. Add metolazone 2.5 mg twice a week on Monday and Friday (give first dose today). If this strategy doesn't work cans witch lasix to torsemide.  - Continue 20 meq K daily. Check labs today. Take 20 extra 20 KCL with metolazone.  - Has a narrow euvolemic window (goal 167-170pounds).  - Reinforced daily weights, limiting fluids twice a day, and low salt food choices.  2. Pulmonary Hypertension - PA peak pressure 68 mm Hg last echo. RHC PA 65/30 (43). Had [place]  03/2015 with PVR 6. Suspect mostly 2[place]. Will cotninue to follow. Will not start pulmonary vasodilator at this point.  3. COPD - On chronic 2 liters 02 4. Paroxysmal atrial fibrillation, on Eliquis 5 mg twice daily. Rate controlled on metoprolol and cardizem. With creatinine > 1.5 will need to cut back to 2.5 bid when she turns 89.  5. H/o CAD s/p stenting of LAD 2008. 6. CKD stage IV - stable. Check BMET today 7. OSA- CPAP broken. Getting new mask soon.   Bensimhon, Daniel,MD 2:09 PM

## 2015-06-16 NOTE — Patient Instructions (Signed)
Start Metolazone 2.5 mg EVERY Monday AND Friday,  But take first dose today then next dose on Monday  Take an extra 20 meq (1 tab) of Potassium when you take Metolazone  Lab today  Lab in 1 week  Keep follow up as scheduled

## 2015-06-23 ENCOUNTER — Ambulatory Visit (HOSPITAL_COMMUNITY)
Admission: RE | Admit: 2015-06-23 | Discharge: 2015-06-23 | Disposition: A | Discharge status: Home / Self Care | Payer: Medicare Other | Source: Ambulatory Visit | Attending: Internal Medicine | Admitting: Internal Medicine

## 2015-06-23 DIAGNOSIS — I5032 Chronic diastolic (congestive) heart failure: Secondary | ICD-10-CM | POA: Diagnosis present

## 2015-06-23 LAB — BASIC METABOLIC PANEL
ANION GAP: 11 (ref 5–15)
BUN: 58 mg/dL — ABNORMAL HIGH (ref 6–20)
CHLORIDE: 98 mmol/L — AB (ref 101–111)
CO2: 30 mmol/L (ref 22–32)
Calcium: 9.8 mg/dL (ref 8.9–10.3)
Creatinine, Ser: 2.62 mg/dL — ABNORMAL HIGH (ref 0.44–1.00)
GFR calc non Af Amer: 16 mL/min — ABNORMAL LOW (ref >60)
GFR, EST AFRICAN AMERICAN: 19 mL/min — AB (ref >60)
GLUCOSE: 135 mg/dL — AB (ref 65–99)
Potassium: 3.8 mmol/L (ref 3.5–5.1)
Sodium: 139 mmol/L (ref 135–145)

## 2015-06-24 ENCOUNTER — Telehealth (HOSPITAL_COMMUNITY): Payer: Self-pay | Admitting: *Deleted

## 2015-06-24 MED ORDER — METOLAZONE 2.5 MG PO TABS: 2.5000 mg | ORAL_TABLET | ORAL | Status: DC | PRN

## 2015-06-24 NOTE — Telephone Encounter (Signed)
Notes Recorded by Scarlette Calico, RN on 06/24/2015 at 1:40 PM Pt aware, agreeable and verbalizes understanding, she reports her wt is down 10 lbs since last week, she is sch for f/u 11/15 w/Dr Bensimhon will recheck labs at that time

## 2015-06-24 NOTE — Telephone Encounter (Signed)
-----   Message from Jolaine Artist, MD sent at 06/23/2015  4:02 PM EST ----- Metolazone just increased. Would change metoalzone to prn only for weight gain. Repeat BMET next week,

## 2015-06-27 ENCOUNTER — Inpatient Hospital Stay (HOSPITAL_COMMUNITY)
Admission: EM | Admit: 2015-06-27 | Discharge: 2015-06-29 | DRG: 683 | Disposition: A | Discharge status: Home / Self Care | Payer: Medicare Other | Attending: Internal Medicine | Admitting: Internal Medicine

## 2015-06-27 ENCOUNTER — Encounter (HOSPITAL_COMMUNITY): Payer: Self-pay | Admitting: *Deleted

## 2015-06-27 ENCOUNTER — Emergency Department (HOSPITAL_COMMUNITY): Payer: Medicare Other

## 2015-06-27 DIAGNOSIS — K219 Gastro-esophageal reflux disease without esophagitis: Secondary | ICD-10-CM | POA: Diagnosis present

## 2015-06-27 DIAGNOSIS — J9611 Chronic respiratory failure with hypoxia: Secondary | ICD-10-CM | POA: Diagnosis present

## 2015-06-27 DIAGNOSIS — N184 Chronic kidney disease, stage 4 (severe): Secondary | ICD-10-CM | POA: Diagnosis present

## 2015-06-27 DIAGNOSIS — Z79899 Other long term (current) drug therapy: Secondary | ICD-10-CM

## 2015-06-27 DIAGNOSIS — Z7901 Long term (current) use of anticoagulants: Secondary | ICD-10-CM | POA: Diagnosis not present

## 2015-06-27 DIAGNOSIS — M1990 Unspecified osteoarthritis, unspecified site: Secondary | ICD-10-CM | POA: Diagnosis present

## 2015-06-27 DIAGNOSIS — E039 Hypothyroidism, unspecified: Secondary | ICD-10-CM | POA: Diagnosis present

## 2015-06-27 DIAGNOSIS — E1122 Type 2 diabetes mellitus with diabetic chronic kidney disease: Secondary | ICD-10-CM | POA: Diagnosis present

## 2015-06-27 DIAGNOSIS — N179 Acute kidney failure, unspecified: Secondary | ICD-10-CM | POA: Diagnosis present

## 2015-06-27 DIAGNOSIS — Z6831 Body mass index (BMI) 31.0-31.9, adult: Secondary | ICD-10-CM

## 2015-06-27 DIAGNOSIS — Z794 Long term (current) use of insulin: Secondary | ICD-10-CM | POA: Diagnosis not present

## 2015-06-27 DIAGNOSIS — I4891 Unspecified atrial fibrillation: Secondary | ICD-10-CM | POA: Diagnosis present

## 2015-06-27 DIAGNOSIS — I5032 Chronic diastolic (congestive) heart failure: Secondary | ICD-10-CM | POA: Diagnosis present

## 2015-06-27 DIAGNOSIS — G4733 Obstructive sleep apnea (adult) (pediatric): Secondary | ICD-10-CM | POA: Diagnosis present

## 2015-06-27 DIAGNOSIS — J449 Chronic obstructive pulmonary disease, unspecified: Secondary | ICD-10-CM | POA: Diagnosis present

## 2015-06-27 DIAGNOSIS — I130 Hypertensive heart and chronic kidney disease with heart failure and stage 1 through stage 4 chronic kidney disease, or unspecified chronic kidney disease: Secondary | ICD-10-CM | POA: Diagnosis present

## 2015-06-27 DIAGNOSIS — I2510 Atherosclerotic heart disease of native coronary artery without angina pectoris: Secondary | ICD-10-CM | POA: Diagnosis present

## 2015-06-27 DIAGNOSIS — J9610 Chronic respiratory failure, unspecified whether with hypoxia or hypercapnia: Secondary | ICD-10-CM | POA: Diagnosis present

## 2015-06-27 DIAGNOSIS — E876 Hypokalemia: Secondary | ICD-10-CM | POA: Diagnosis not present

## 2015-06-27 DIAGNOSIS — I482 Chronic atrial fibrillation: Secondary | ICD-10-CM

## 2015-06-27 DIAGNOSIS — J40 Bronchitis, not specified as acute or chronic: Secondary | ICD-10-CM | POA: Diagnosis not present

## 2015-06-27 DIAGNOSIS — Z8543 Personal history of malignant neoplasm of ovary: Secondary | ICD-10-CM

## 2015-06-27 DIAGNOSIS — E860 Dehydration: Secondary | ICD-10-CM | POA: Diagnosis present

## 2015-06-27 DIAGNOSIS — Z8673 Personal history of transient ischemic attack (TIA), and cerebral infarction without residual deficits: Secondary | ICD-10-CM | POA: Diagnosis not present

## 2015-06-27 DIAGNOSIS — M810 Age-related osteoporosis without current pathological fracture: Secondary | ICD-10-CM | POA: Diagnosis present

## 2015-06-27 DIAGNOSIS — I272 Other secondary pulmonary hypertension: Secondary | ICD-10-CM | POA: Diagnosis present

## 2015-06-27 DIAGNOSIS — Z87891 Personal history of nicotine dependence: Secondary | ICD-10-CM

## 2015-06-27 DIAGNOSIS — I480 Paroxysmal atrial fibrillation: Secondary | ICD-10-CM | POA: Diagnosis present

## 2015-06-27 DIAGNOSIS — T502X5A Adverse effect of carbonic-anhydrase inhibitors, benzothiadiazides and other diuretics, initial encounter: Secondary | ICD-10-CM | POA: Diagnosis present

## 2015-06-27 DIAGNOSIS — E662 Morbid (severe) obesity with alveolar hypoventilation: Secondary | ICD-10-CM | POA: Diagnosis present

## 2015-06-27 DIAGNOSIS — Z9981 Dependence on supplemental oxygen: Secondary | ICD-10-CM

## 2015-06-27 DIAGNOSIS — Z86718 Personal history of other venous thrombosis and embolism: Secondary | ICD-10-CM | POA: Diagnosis not present

## 2015-06-27 DIAGNOSIS — N189 Chronic kidney disease, unspecified: Secondary | ICD-10-CM

## 2015-06-27 DIAGNOSIS — E1159 Type 2 diabetes mellitus with other circulatory complications: Secondary | ICD-10-CM

## 2015-06-27 LAB — I-STAT TROPONIN, ED: Troponin i, poc: 0.02 ng/mL (ref 0.00–0.08)

## 2015-06-27 LAB — BASIC METABOLIC PANEL
Anion gap: 15 (ref 5–15)
BUN: 67 mg/dL — ABNORMAL HIGH (ref 6–20)
CO2: 31 mmol/L (ref 22–32)
Calcium: 10.5 mg/dL — ABNORMAL HIGH (ref 8.9–10.3)
Chloride: 91 mmol/L — ABNORMAL LOW (ref 101–111)
Creatinine, Ser: 3.1 mg/dL — ABNORMAL HIGH (ref 0.44–1.00)
GFR calc Af Amer: 15 mL/min — ABNORMAL LOW (ref >60)
GFR calc non Af Amer: 13 mL/min — ABNORMAL LOW (ref >60)
Glucose, Bld: 283 mg/dL — ABNORMAL HIGH (ref 65–99)
Potassium: 4.3 mmol/L (ref 3.5–5.1)
Sodium: 137 mmol/L (ref 135–145)

## 2015-06-27 LAB — CBC
HEMATOCRIT: 47.3 % — AB (ref 36.0–46.0)
Hemoglobin: 15 g/dL (ref 12.0–15.0)
MCH: 29.9 pg (ref 26.0–34.0)
MCHC: 31.7 g/dL (ref 30.0–36.0)
MCV: 94.4 fL (ref 78.0–100.0)
Platelets: 200 10*3/uL (ref 150–400)
RBC: 5.01 MIL/uL (ref 3.87–5.11)
RDW: 16.3 % — AB (ref 11.5–15.5)
WBC: 7.8 10*3/uL (ref 4.0–10.5)

## 2015-06-27 MED ORDER — ISOSORBIDE MONONITRATE ER 30 MG PO TB24
30.0000 mg | ORAL_TABLET | Freq: Every day | ORAL | Status: DC
  Administered 2015-06-29: 30 mg via ORAL
  Filled 2015-06-27 (×2): qty 1

## 2015-06-27 MED ORDER — AMOXICILLIN-POT CLAVULANATE 875-125 MG PO TABS
1.0000 | ORAL_TABLET | Freq: Two times a day (BID) | ORAL | Status: DC
  Administered 2015-06-28: 1 via ORAL

## 2015-06-27 MED ORDER — TRAMADOL HCL 50 MG PO TABS
50.0000 mg | ORAL_TABLET | Freq: Four times a day (QID) | ORAL | Status: DC | PRN
  Administered 2015-06-28 – 2015-06-29 (×3): 50 mg via ORAL
  Filled 2015-06-27 (×3): qty 1

## 2015-06-27 MED ORDER — METOPROLOL TARTRATE 50 MG PO TABS
50.0000 mg | ORAL_TABLET | Freq: Two times a day (BID) | ORAL | Status: DC
  Administered 2015-06-28 – 2015-06-29 (×3): 50 mg via ORAL
  Filled 2015-06-27 (×4): qty 1

## 2015-06-27 MED ORDER — CALCITRIOL 0.25 MCG PO CAPS
0.2500 ug | ORAL_CAPSULE | Freq: Every day | ORAL | Status: DC
  Administered 2015-06-28 – 2015-06-29 (×2): 0.25 ug via ORAL
  Filled 2015-06-27 (×2): qty 1

## 2015-06-27 MED ORDER — ALLOPURINOL 100 MG PO TABS
100.0000 mg | ORAL_TABLET | Freq: Every day | ORAL | Status: DC
  Administered 2015-06-28 – 2015-06-29 (×2): 100 mg via ORAL
  Filled 2015-06-27 (×2): qty 1

## 2015-06-27 MED ORDER — INSULIN ASPART 100 UNIT/ML ~~LOC~~ SOLN
0.0000 [IU] | Freq: Three times a day (TID) | SUBCUTANEOUS | Status: DC
  Administered 2015-06-28: 8 [IU] via SUBCUTANEOUS
  Administered 2015-06-28: 3 [IU] via SUBCUTANEOUS
  Administered 2015-06-29 (×2): 5 [IU] via SUBCUTANEOUS

## 2015-06-27 MED ORDER — AMLODIPINE BESYLATE 2.5 MG PO TABS
2.5000 mg | ORAL_TABLET | Freq: Every day | ORAL | Status: DC
  Administered 2015-06-29: 2.5 mg via ORAL
  Filled 2015-06-27 (×2): qty 1

## 2015-06-27 MED ORDER — METHOCARBAMOL 500 MG PO TABS
500.0000 mg | ORAL_TABLET | Freq: Every day | ORAL | Status: DC
  Administered 2015-06-28 (×2): 500 mg via ORAL
  Filled 2015-06-27 (×2): qty 1

## 2015-06-27 MED ORDER — BISACODYL 5 MG PO TBEC
5.0000 mg | DELAYED_RELEASE_TABLET | Freq: Every day | ORAL | Status: DC | PRN
  Administered 2015-06-28: 5 mg via ORAL
  Filled 2015-06-27: qty 1

## 2015-06-27 MED ORDER — OXYBUTYNIN CHLORIDE ER 10 MG PO TB24
10.0000 mg | ORAL_TABLET | Freq: Every day | ORAL | Status: DC
  Administered 2015-06-28 – 2015-06-29 (×2): 10 mg via ORAL
  Filled 2015-06-27 (×2): qty 1

## 2015-06-27 MED ORDER — ALBUTEROL SULFATE HFA 108 (90 BASE) MCG/ACT IN AERS: 1.0000 | INHALATION_SPRAY | RESPIRATORY_TRACT | Status: DC | PRN

## 2015-06-27 MED ORDER — LACTATED RINGERS IV BOLUS (SEPSIS)
500.0000 mL | Freq: Once | INTRAVENOUS | Status: AC
Start: 2015-06-27 — End: 2015-06-28
  Administered 2015-06-28: 500 mL via INTRAVENOUS

## 2015-06-27 MED ORDER — ACETAMINOPHEN 500 MG PO TABS
1000.0000 mg | ORAL_TABLET | Freq: Four times a day (QID) | ORAL | Status: DC | PRN
  Administered 2015-06-28: 1000 mg via ORAL
  Filled 2015-06-27: qty 2

## 2015-06-27 MED ORDER — APIXABAN 2.5 MG PO TABS
2.5000 mg | ORAL_TABLET | Freq: Two times a day (BID) | ORAL | Status: DC
  Administered 2015-06-28 – 2015-06-29 (×4): 2.5 mg via ORAL
  Filled 2015-06-27 (×4): qty 1

## 2015-06-27 MED ORDER — SODIUM CHLORIDE 0.9 % IV SOLN
Freq: Once | INTRAVENOUS | Status: AC
Start: 2015-06-27 — End: 2015-06-28
  Administered 2015-06-28: 10:00:00 via INTRAVENOUS

## 2015-06-27 MED ORDER — LEVOTHYROXINE SODIUM 25 MCG PO TABS
25.0000 ug | ORAL_TABLET | ORAL | Status: DC
  Administered 2015-06-29: 25 ug via ORAL
  Filled 2015-06-27: qty 1

## 2015-06-27 MED ORDER — SODIUM CHLORIDE 0.9 % IV BOLUS (SEPSIS)
1000.0000 mL | Freq: Once | INTRAVENOUS | Status: AC
Start: 2015-06-27 — End: 2015-06-28
  Administered 2015-06-28: 1000 mL via INTRAVENOUS

## 2015-06-27 MED ORDER — ALBUTEROL SULFATE (2.5 MG/3ML) 0.083% IN NEBU: 2.5000 mg | INHALATION_SOLUTION | RESPIRATORY_TRACT | Status: DC | PRN

## 2015-06-27 MED ORDER — DILTIAZEM HCL ER COATED BEADS 120 MG PO CP24
120.0000 mg | ORAL_CAPSULE | Freq: Every day | ORAL | Status: DC
  Administered 2015-06-29: 120 mg via ORAL
  Filled 2015-06-27 (×2): qty 1

## 2015-06-27 MED ORDER — LEVOTHYROXINE SODIUM 50 MCG PO TABS
50.0000 ug | ORAL_TABLET | ORAL | Status: DC
  Administered 2015-06-28: 50 ug via ORAL
  Filled 2015-06-27: qty 1

## 2015-06-27 MED ORDER — INSULIN GLARGINE 100 UNIT/ML ~~LOC~~ SOLN
20.0000 [IU] | Freq: Every day | SUBCUTANEOUS | Status: DC
  Administered 2015-06-28 – 2015-06-29 (×2): 20 [IU] via SUBCUTANEOUS
  Filled 2015-06-27 (×2): qty 0.2

## 2015-06-27 MED ORDER — ROSUVASTATIN CALCIUM 20 MG PO TABS
20.0000 mg | ORAL_TABLET | Freq: Every day | ORAL | Status: DC
  Administered 2015-06-28 – 2015-06-29 (×2): 20 mg via ORAL
  Filled 2015-06-27: qty 2
  Filled 2015-06-27: qty 1

## 2015-06-27 MED ORDER — BUDESONIDE 0.25 MG/2ML IN SUSP
0.2500 mg | Freq: Every day | RESPIRATORY_TRACT | Status: DC
  Administered 2015-06-28 – 2015-06-29 (×2): 0.25 mg via RESPIRATORY_TRACT
  Filled 2015-06-27 (×2): qty 2

## 2015-06-27 MED ORDER — GUAIFENESIN ER 600 MG PO TB12
600.0000 mg | ORAL_TABLET | Freq: Every day | ORAL | Status: DC
  Administered 2015-06-28 – 2015-06-29 (×2): 600 mg via ORAL
  Filled 2015-06-27 (×3): qty 1

## 2015-06-27 NOTE — ED Notes (Signed)
Patient placed on an ED portable O2 tank @ 3 lpm

## 2015-06-27 NOTE — ED Notes (Signed)
Attempted report x1. 

## 2015-06-27 NOTE — ED Provider Notes (Addendum)
CSN: [id number]     Arrival date & time 06/27/15  1907 History   First MD Initiated Contact with Patient 06/27/15 2138     Chief Complaint  Patient presents with  . Abnormal Lab  . Shortness of Breath     (Consider location/radiation/quality/duration/timing/severity/associated sxs/prior Treatment) HPI Comments: Pt with hx of CAD, CHF, CKD, Afib, comes in with cc of elevated CR. Pt was seen by PCP today for routine f.u. She was started on augmentin for bronchitis and had basic labs done. She was called in the evening and asked to come to the ER for admission. Pt reports seeing Dr. Tempie Hoist earlier in the month for fluid overload - and she did take zaroxolyn x 3 doses with 10 lbs weight loss. She also reports taking colchicine last week for gout flair.   ROS 10 Systems reviewed and are negative for acute change except as noted in the HPI.     Patient is a 81 y.o. female presenting with shortness of breath. The history is provided by the patient.  Shortness of Breath   Past Medical History  Diagnosis Date  . CAD (coronary artery disease)   . CHF (congestive heart failure) ([place])   . Paroxysmal atrial fibrillation (HCC)   . Bradycardia   . Hypertension   . Renal artery stenosis ([place])   . COPD (chronic obstructive pulmonary disease) ([place])   . Obesity hypoventilation syndrome ([place])   . Pruritus   . Tracheobronchitis   . Acute rhinosinusitis   . Anemia   . Hypoxemia   . Peripheral edema   . Obesity, endogenous   . Diabetes mellitus   . Acid reflux disease   . Osteoarthritis, generalized   . Hypothyroidism   . Lung nodule     lingula  . OSA (obstructive sleep apnea)     NPSG 04/03/00--AHI 28.hr  . DVT (deep venous thrombosis) ([place]) 2003    left leg  . Vaginal atrophy   . Hx of colonic polyps   . Acute diastolic heart failure ([place])   . Secondary pulmonary hypertension ([place]) 06/09/2013  . Blood transfusion without reported diagnosis 2009    had internal bleeding  .  Cancer ([place]) 1959    ovarian cancer  . Osteoporosis   . Stroke [facility])    Past Surgical History  Procedure Laterality Date  . Lumbar spine surgery    . Cholecystectomy    . Hemorroidectomy    . Repair wound fistula    . Right shoulder    . Elbow surgery    . Dilation and curettage of uterus    . Breast surgery  1998    lumpectomy  . Oophorectomy  1959    BSO-? ovarian cancer  . Bilateral salpingoophorectomy  1959    -d/t ovarian cancer  . Rotator cuff repair Right   . Cardiac catheterization N/A 03/21/2015    Procedure: Right Heart Cath;  Surgeon: Jolaine Artist, MD;  Location: [facility];  Service: Cardiovascular;  Laterality: N/A;   Family History  Problem Relation Age of Onset  . Pneumonia Mother   . Heart disease Mother   . Allergies Mother   . Emphysema Mother   . Arthritis Mother   . Hypertension Mother   . Heart attack Father   . Hypertension Father   . Diabetes Sister   . Heart disease Sister   . Diabetes Brother   . Diabetes      grandfather  . Breast cancer  aunt  . Bone cancer      aunt  . Clotting disorder Brother   . Arthritis Sister   . Breast cancer Maternal Aunt    Social History  Substance Use Topics  . Smoking status: Former Smoker    Quit date: 08/13/1978  . Smokeless tobacco: Never Used  . Alcohol Use: No   OB History    Gravida Para Term Preterm AB TAB SAB Ectopic Multiple Living   0               Obstetric Comments   2 adopted children     Review of Systems  Respiratory: Positive for shortness of breath.       Allergies  Cefuroxime axetil; Cephalexin; Clarithromycin; Doxycycline; Erythromycin; and Tetracycline  Home Medications   Prior to Admission medications   Medication Sig Start Date End Date Taking? Authorizing Provider  acetaminophen (TYLENOL) 500 MG tablet Take 1,000 mg by mouth every 6 (six) hours as needed.   Yes Historical Provider, MD  albuterol (PROVENTIL HFA;VENTOLIN HFA) 108 (90 BASE) MCG/ACT  inhaler Inhale 1 puff into the lungs every 4 (four) hours as needed for wheezing or shortness of breath.   Yes Historical Provider, MD  albuterol (PROVENTIL) (2.5 MG/3ML) 0.083% nebulizer solution Take 3 mLs (2.5 mg total) by nebulization every 2 (two) hours as needed for wheezing or shortness of breath. 03/25/15  Yes Shanker Kristeen Mans, MD  allopurinol (ZYLOPRIM) 100 MG tablet Take 1 tablet (100 mg total) by mouth daily. 07/21/13  Yes Samella Parr, NP  amLODipine (NORVASC) 2.5 MG tablet Take 1 tablet (2.5 mg total) by mouth daily. 05/17/15  Yes Jolaine Artist, MD  azelastine (ASTELIN) 137 MCG/SPRAY nasal spray 1-2 sprays each nostril once or twice daily 11/05/13  Yes Deneise Lever, MD  bisacodyl (DULCOLAX) 5 MG EC tablet Take 5 mg by mouth daily as needed for moderate constipation.   Yes Historical Provider, MD  budesonide (PULMICORT) 0.25 MG/2ML nebulizer solution Take 2 mLs (0.25 mg total) by nebulization daily. 05/13/14  Yes Deneise Lever, MD  calcitRIOL (ROCALTROL) 0.25 MCG capsule Take 0.25 mcg by mouth daily.  06/06/15  Yes Historical Provider, MD  colchicine 0.6 MG tablet Take 1.2 mg by mouth daily as needed (for gout flareups).    Yes Historical Provider, MD  CRESTOR 20 MG tablet Take 20 mg by mouth daily.  06/10/15  Yes Historical Provider, MD  diltiazem (CARDIZEM CD) 120 MG 24 hr capsule Take 1 capsule (120 mg total) by mouth daily. 03/25/15  Yes Shanker Kristeen Mans, MD  guaiFENesin (MUCINEX) 600 MG 12 hr tablet Take 600 mg by mouth daily.   Yes Historical Provider, MD  insulin glargine (LANTUS) 100 UNIT/ML injection Inject 20-35 Units into the skin 2 (two) times daily. Inject 20 units in the morning and 35 units in the evening   Yes Historical Provider, MD  insulin lispro (HUMALOG) 100 UNIT/ML injection Inject 2 Units into the skin 2 (two) times daily with breakfast and lunch. Per sliding scale. Patient unsure about how much she really takes   Yes Historical Provider, MD  isosorbide  mononitrate (IMDUR) 30 MG 24 hr tablet Take 30 mg by mouth daily.   Yes Historical Provider, MD  levothyroxine (SYNTHROID, LEVOTHROID) 25 MCG tablet Take 25 mcg by mouth See admin instructions. Take 1 tablet (25 mcg) on MON, WED, FRI, SUN   Yes Historical Provider, MD  levothyroxine (SYNTHROID, LEVOTHROID) 50 MCG tablet Take 50 mcg by mouth  See admin instructions. Take 50mg  on Tues, Thurs, Sat   Yes Historical Provider, MD  methocarbamol (ROBAXIN) 500 MG tablet Take 500 mg by mouth at bedtime.   Yes Historical Provider, MD  metoprolol (LOPRESSOR) 50 MG tablet Take 50 mg by mouth 2 (two) times daily.   Yes Historical Provider, MD  nitroGLYCERIN (NITROSTAT) 0.4 MG SL tablet Place 0.4 mg under the tongue every 5 (five) minutes as needed for chest pain.    Yes Historical Provider, MD  oxybutynin (DITROPAN XL) 10 MG 24 hr tablet Take 10 mg by mouth daily.    Yes Historical Provider, MD  traMADol (ULTRAM) 50 MG tablet Take 50 mg by mouth every 6 (six) hours as needed for moderate pain.    Yes Historical Provider, MD  apixaban (ELIQUIS) 2.5 MG TABS tablet Take 2.5 mg by mouth 2 (two) times daily.    Historical Provider, MD  furosemide (LASIX) 20 MG tablet Take 3 tablets (60 mg total) by mouth 2 (two) times daily. 07/05/15   Jolaine Artist, MD  metolazone (ZAROXOLYN) 2.5 MG tablet Take 1 tablet (2.5 mg total) by mouth as needed. 07/05/15   Jolaine Artist, MD  potassium chloride SA (KLOR-CON M20) 20 MEQ tablet Take 1 tablet (20 mEq total) by mouth daily. Take an extra tab when Metolazone 07/05/15   Shaune Pascal Bensimhon, MD   BP 130/81 mmHg  Pulse 106  Temp(Src) 97.7 F (36.5 C) (Oral)  Resp 18  Ht 5' 0.25" (1.53 m)  Wt 161 lb 12.8 oz (73.392 kg)  BMI 31.35 kg/m2  SpO2 98%  LMP 08/13/1957 Physical Exam  Constitutional: She is oriented to person, place, and time. She appears well-developed.  HENT:  Head: Normocephalic and atraumatic.  Eyes: EOM are normal.  Neck: Normal range of motion. Neck  supple.  Cardiovascular: Normal rate.   Pulmonary/Chest: Effort normal.  Abdominal: Bowel sounds are normal.  Neurological: She is alert and oriented to person, place, and time.  Skin: Skin is warm and dry.  Nursing note and vitals reviewed.   ED Course  Procedures (including critical care time) Labs Review Labs Reviewed  BASIC METABOLIC PANEL - Abnormal; Notable for the following:    Chloride 91 (*)    Glucose, Bld 283 (*)    BUN 67 (*)    Creatinine, Ser 3.10 (*)    Calcium 10.5 (*)    GFR calc non Af Amer 13 (*)    GFR calc Af Amer 15 (*)    All other components within normal limits  CBC - Abnormal; Notable for the following:    HCT 47.3 (*)    RDW 16.3 (*)    All other components within normal limits  BASIC METABOLIC PANEL - Abnormal; Notable for the following:    Sodium 134 (*)    Potassium 3.2 (*)    Chloride 93 (*)    Glucose, Bld 217 (*)    BUN 68 (*)    Creatinine, Ser 2.87 (*)    GFR calc non Af Amer 15 (*)    GFR calc Af Amer 17 (*)    All other components within normal limits  GLUCOSE, CAPILLARY - Abnormal; Notable for the following:    Glucose-Capillary 240 (*)    All other components within normal limits  GLUCOSE, CAPILLARY - Abnormal; Notable for the following:    Glucose-Capillary 166 (*)    All other components within normal limits  GLUCOSE, CAPILLARY - Abnormal; Notable for the following:  Glucose-Capillary 252 (*)    All other components within normal limits  BASIC METABOLIC PANEL - Abnormal; Notable for the following:    Chloride 99 (*)    Glucose, Bld 226 (*)    BUN 55 (*)    Creatinine, Ser 1.97 (*)    GFR calc non Af Amer 23 (*)    GFR calc Af Amer 27 (*)    All other components within normal limits  GLUCOSE, CAPILLARY - Abnormal; Notable for the following:    Glucose-Capillary 104 (*)    All other components within normal limits  GLUCOSE, CAPILLARY - Abnormal; Notable for the following:    Glucose-Capillary 160 (*)    All other  components within normal limits  GLUCOSE, CAPILLARY - Abnormal; Notable for the following:    Glucose-Capillary 209 (*)    All other components within normal limits  GLUCOSE, CAPILLARY - Abnormal; Notable for the following:    Glucose-Capillary 238 (*)    All other components within normal limits  I-STAT TROPOININ, ED    Imaging Review No results found. I have personally reviewed and evaluated these images and lab results as part of my medical decision-making.   EKG Interpretation   Date/Time:  Monday June 27 2015 19:53:25 EST Ventricular Rate:  104 PR Interval:    QRS Duration: 94 QT Interval:  378 QTC Calculation: 497 R Axis:   96 Text Interpretation:  Undetermined rhythm Rightward axis Incomplete right  bundle branch block Septal infarct , age undetermined Abnormal ECG No  acute changes Confirmed by Kathrynn Humble, MD, Thelma Comp [phone]) on 06/27/2015  10:20:05 PM      MDM   Final diagnoses:  Acute on chronic renal failure (HCC)    Pt sent for abnormal Cr. She has acute on chronic renal failure, likely from combination of lasix and colchicine use over the past few days. Will give her a 500 cc bolus now - and have medicine to admit for further gentle hydration and removal of any nephrotoxic meds.    Varney Biles, MD 06/27/15 [id number]  Varney Biles, MD 07/11/15 [phone]

## 2015-06-27 NOTE — ED Notes (Signed)
Pt in c/o shortness of breath, went to PCP and told she had bronchitis, called back this evening and told her renal function in her lab work was abnormal, pt denies pain, history of kidney problems, on home O2 at 2L

## 2015-06-27 NOTE — H&P (Signed)
[facility] History and Physical  [name] [id number] DOB: [birth year] DOA: 06/27/2015  Referring physician: EDP PCP: Cari Caraway, MD   Chief Complaint: Abnormal lab, SOB   HPI: [name] is a 81 y.o. female with h/o CHF.  She recently had exacerbation with fluid on lungs and was seen by Dr. Haroldine Laws in the office on 11/3.  He noted that she weighed 173 lbs at that time, upped her diuretics, and also noted in his office note that she had a very narrow theraputic window between 167 to 170 lbs.  At her doctors office she was noted to weigh 164 lbs 4 days ago, she was started on augmentin for bronchitis as well at that time.  Labs drawn at that time have come back showing a jump in her creatinine from 1.8 on 11/3 to 2.6 on 11/10.  She was instructed to come in to the ED.  Creatinine in ED today is 3.1 and her weight is 163.8 lbs.  Review of Systems: Systems reviewed.  As above, otherwise negative  Past Medical History  Diagnosis Date  . CAD (coronary artery disease)   . CHF (congestive heart failure) ([place])   . Paroxysmal atrial fibrillation (HCC)   . Bradycardia   . Hypertension   . Renal artery stenosis ([place])   . COPD (chronic obstructive pulmonary disease) ([place])   . Obesity hypoventilation syndrome ([place])   . Pruritus   . Tracheobronchitis   . Acute rhinosinusitis   . Anemia   . Hypoxemia   . Peripheral edema   . Obesity, endogenous   . Diabetes mellitus   . Acid reflux disease   . Osteoarthritis, generalized   . Hypothyroidism   . Lung nodule     lingula  . OSA (obstructive sleep apnea)     NPSG 04/03/00--AHI 28.hr  . DVT (deep venous thrombosis) ([place]) 2003    left leg  . Vaginal atrophy   . Hx of colonic polyps   . Acute diastolic heart failure ([place])   . Secondary pulmonary hypertension ([place]) 06/09/2013  . Blood transfusion without reported diagnosis 2009    had internal bleeding  . Cancer ([place]) 1959    ovarian cancer  .  Osteoporosis   . Stroke [facility])    Past Surgical History  Procedure Laterality Date  . Lumbar spine surgery    . Cholecystectomy    . Hemorroidectomy    . Repair wound fistula    . Right shoulder    . Elbow surgery    . Dilation and curettage of uterus    . Breast surgery  1998    lumpectomy  . Oophorectomy  1959    BSO-? ovarian cancer  . Bilateral salpingoophorectomy  1959    -d/t ovarian cancer  . Rotator cuff repair Right   . Cardiac catheterization N/A 03/21/2015    Procedure: Right Heart Cath;  Surgeon: Jolaine Artist, MD;  Location: [facility];  Service: Cardiovascular;  Laterality: N/A;   Social History:  reports that she quit smoking about 36 years ago. She has never used smokeless tobacco. She reports that she does not drink alcohol or use illicit drugs.  Allergies  Allergen Reactions  . Cefuroxime Axetil Shortness Of Breath, Swelling and Rash    Throat swelling  . Cephalexin Shortness Of Breath, Swelling and Rash    Throat swelling  . Clarithromycin Shortness Of Breath, Swelling and Rash    Throat swelling  . Doxycycline Shortness Of Breath, Swelling and  Rash     rash on legs and feet, throat swelling  . Erythromycin Shortness Of Breath, Swelling and Rash    Throat swelling  . Tetracycline Shortness Of Breath, Swelling and Rash    Throat swelling    Family History  Problem Relation Age of Onset  . Pneumonia Mother   . Heart disease Mother   . Allergies Mother   . Emphysema Mother   . Arthritis Mother   . Hypertension Mother   . Heart attack Father   . Hypertension Father   . Diabetes Sister   . Heart disease Sister   . Diabetes Brother   . Diabetes      grandfather  . Breast cancer      aunt  . Bone cancer      aunt  . Clotting disorder Brother   . Arthritis Sister   . Breast cancer Maternal Aunt      Prior to Admission medications   Medication Sig Start Date End Date Taking? Authorizing Provider  acetaminophen (TYLENOL) 500 MG  tablet Take 1,000 mg by mouth every 6 (six) hours as needed.   Yes Historical Provider, MD  albuterol (PROVENTIL HFA;VENTOLIN HFA) 108 (90 BASE) MCG/ACT inhaler Inhale 1 puff into the lungs every 4 (four) hours as needed for wheezing or shortness of breath.   Yes Historical Provider, MD  albuterol (PROVENTIL) (2.5 MG/3ML) 0.083% nebulizer solution Take 3 mLs (2.5 mg total) by nebulization every 2 (two) hours as needed for wheezing or shortness of breath. 03/25/15  Yes Shanker Kristeen Mans, MD  allopurinol (ZYLOPRIM) 100 MG tablet Take 1 tablet (100 mg total) by mouth daily. 07/21/13  Yes Samella Parr, NP  amLODipine (NORVASC) 2.5 MG tablet Take 1 tablet (2.5 mg total) by mouth daily. 05/17/15  Yes Jolaine Artist, MD  apixaban (ELIQUIS) 5 MG TABS tablet Take 1 tablet (5 mg total) by mouth 2 (two) times daily. Patient taking differently: Take 2.5 mg by mouth 2 (two) times daily.  01/06/15  Yes Jettie Booze, MD  azelastine (ASTELIN) 137 MCG/SPRAY nasal spray 1-2 sprays each nostril once or twice daily 11/05/13  Yes Deneise Lever, MD  bisacodyl (DULCOLAX) 5 MG EC tablet Take 5 mg by mouth daily as needed for moderate constipation.   Yes Historical Provider, MD  budesonide (PULMICORT) 0.25 MG/2ML nebulizer solution Take 2 mLs (0.25 mg total) by nebulization daily. 05/13/14  Yes Deneise Lever, MD  calcitRIOL (ROCALTROL) 0.25 MCG capsule Take 0.25 mcg by mouth daily.  06/06/15  Yes Historical Provider, MD  colchicine 0.6 MG tablet Take 1.2 mg by mouth daily as needed (for gout flareups).    Yes Historical Provider, MD  CRESTOR 20 MG tablet Take 20 mg by mouth daily.  06/10/15  Yes Historical Provider, MD  diltiazem (CARDIZEM CD) 120 MG 24 hr capsule Take 1 capsule (120 mg total) by mouth daily. 03/25/15  Yes Shanker Kristeen Mans, MD  furosemide (LASIX) 40 MG tablet Take 80 mg by mouth 2 (two) times daily. Take 80 mg in AM and 80 mg in PM   Yes Historical Provider, MD  guaiFENesin (MUCINEX) 600 MG 12 hr  tablet Take 600 mg by mouth daily.   Yes Historical Provider, MD  insulin glargine (LANTUS) 100 UNIT/ML injection Inject 20-35 Units into the skin 2 (two) times daily. Inject 20 units in the morning and 35 units in the evening   Yes Historical Provider, MD  insulin lispro (HUMALOG) 100 UNIT/ML injection Inject  2 Units into the skin 2 (two) times daily with breakfast and lunch. Per sliding scale. Patient unsure about how much she really takes   Yes Historical Provider, MD  isosorbide mononitrate (IMDUR) 30 MG 24 hr tablet Take 30 mg by mouth daily.   Yes Historical Provider, MD  levothyroxine (SYNTHROID, LEVOTHROID) 25 MCG tablet Take 25 mcg by mouth See admin instructions. Take 1 tablet (25 mcg) on MON, WED, FRI, SUN   Yes Historical Provider, MD  levothyroxine (SYNTHROID, LEVOTHROID) 50 MCG tablet Take 50 mcg by mouth See admin instructions. Take 50mg  on Tues, Thurs, Sat   Yes Historical Provider, MD  methocarbamol (ROBAXIN) 500 MG tablet Take 500 mg by mouth at bedtime.   Yes Historical Provider, MD  metolazone (ZAROXOLYN) 2.5 MG tablet Take 1 tablet (2.5 mg total) by mouth as needed. 06/24/15  Yes Larey Dresser, MD  metoprolol (LOPRESSOR) 50 MG tablet Take 50 mg by mouth 2 (two) times daily.   Yes Historical Provider, MD  nitroGLYCERIN (NITROSTAT) 0.4 MG SL tablet Place 0.4 mg under the tongue every 5 (five) minutes as needed for chest pain.    Yes Historical Provider, MD  oxybutynin (DITROPAN XL) 10 MG 24 hr tablet Take 10 mg by mouth daily.    Yes Historical Provider, MD  potassium chloride SA (KLOR-CON M20) 20 MEQ tablet Take 1 tablet (20 mEq total) by mouth daily. Take an extra tab on Mon and Fri with Metolazone 06/16/15  Yes Jolaine Artist, MD  traMADol (ULTRAM) 50 MG tablet Take 50 mg by mouth every 6 (six) hours as needed for moderate pain.    Yes Historical Provider, MD   Physical Exam: Filed Vitals:   06/27/15 2215  BP: 117/76  Pulse: 80  Temp:   Resp: 18    BP 117/76 mmHg   Pulse 80  Temp(Src) 98 F (36.7 C) (Oral)  Resp 18  Ht 5' 0.25" (1.53 m)  Wt 74.163 kg (163 lb 8 oz)  BMI 31.68 kg/m2  SpO2 98%  LMP 08/13/1957  General Appearance:    Alert, oriented, no distress, appears stated age  Head:    Normocephalic, atraumatic  Eyes:    PERRL, EOMI, sclera non-icteric        Nose:   Nares without drainage or epistaxis. Mucosa, turbinates normal  Throat:   Moist mucous membranes. Oropharynx without erythema or exudate.  Neck:   Supple. No carotid bruits.  No thyromegaly.  No lymphadenopathy.   Back:     No CVA tenderness, no spinal tenderness  Lungs:     Clear to auscultation bilaterally, without wheezes, rhonchi or rales  Chest wall:    No tenderness to palpitation  Heart:    Regular rate and rhythm without murmurs, gallops, rubs  Abdomen:     Soft, non-tender, nondistended, normal bowel sounds, no organomegaly  Genitalia:    deferred  Rectal:    deferred  Extremities:   No clubbing, cyanosis or edema.  Pulses:   2+ and symmetric all extremities  Skin:   Skin color, texture, turgor normal, no rashes or lesions  Lymph nodes:   Cervical, supraclavicular, and axillary nodes normal  Neurologic:   CNII-XII intact. Normal strength, sensation and reflexes      throughout    Labs on Admission:  Basic Metabolic Panel:  Recent Labs Lab 06/23/15 1453 06/27/15 2037  NA 139 137  K 3.8 4.3  CL 98* 91*  CO2 30 31  GLUCOSE 135* 283*  BUN  58* 67*  CREATININE 2.62* 3.10*  CALCIUM 9.8 10.5*   Liver Function Tests: No results for input(s): AST, ALT, ALKPHOS, BILITOT, PROT, ALBUMIN in the last 168 hours. No results for input(s): LIPASE, AMYLASE in the last 168 hours. No results for input(s): AMMONIA in the last 168 hours. CBC:  Recent Labs Lab 06/27/15 2037  WBC 7.8  HGB 15.0  HCT 47.3*  MCV 94.4  PLT 200   Cardiac Enzymes: No results for input(s): CKTOTAL, CKMB, CKMBINDEX, TROPONINI in the last 168 hours.  BNP (last 3 results)  Recent Labs   04/11/15 1026  PROBNP 1411.0*   CBG: No results for input(s): GLUCAP in the last 168 hours.  Radiological Exams on Admission: Dg Chest 2 View  06/27/2015  CLINICAL DATA:  Acute onset of shortness of breath. Initial encounter. EXAM: CHEST  2 VIEW COMPARISON:  Chest radiograph performed 03/28/2015 FINDINGS: The lungs are well-aerated. Mild bibasilar atelectasis is noted. There is no evidence of pleural effusion or pneumothorax. The heart is borderline normal in size. No acute osseous abnormalities are seen. A right humeral head prosthesis is grossly unremarkable in appearance, though incompletely imaged. Diffuse vascular calcifications are seen. Clips are noted within the right upper quadrant, reflecting prior cholecystectomy. IMPRESSION: Mild bibasilar atelectasis noted.  Lungs otherwise grossly clear. Electronically Signed   By: Garald Balding M.D.   On: 06/27/2015 21:01    EKG: Independently reviewed.  Assessment/Plan Principal Problem:   AKI (acute kidney injury) ([place]) Active Problems:   Atrial fibrillation (HCC)   Chronic diastolic heart failure ([place])   Type 2 diabetes mellitus with other circulatory complications (HCC)   Chronic respiratory failure (HCC)   Bronchitis   1. AKI - pre-renal due to over diuresis: 1. conveniently Dr. Haroldine Laws explains in his note that patient is known to have narrow theraputic window between 167 to 170 lbs. 2. Currently weighs 163.8 lbs 3. Plan 1L IVF NS bolus in ED then an additional 1L given slowly over night 4. Repeat BMP in AM 2. DM2 - 1. Continue home lantus 2. Mod dose SSI AC/HS 3. A.Fib - continue rate control and anticoagulation 4. Bronchitis - continue augmentin    Code Status: Full Code  Family Communication: No family in room Disposition Plan: Admit to inpatient   Time spent: 70 min  GARDNER, JARED M. [facility] Pager [phone]  If 7AM-7PM, please contact the day team taking care of the patient [email or web address] Password  [facility] 06/27/2015, 10:54 PM

## 2015-06-28 ENCOUNTER — Encounter (HOSPITAL_COMMUNITY): Payer: Medicare Other | Admitting: Internal Medicine

## 2015-06-28 ENCOUNTER — Encounter (HOSPITAL_COMMUNITY): Payer: Self-pay | Admitting: *Deleted

## 2015-06-28 LAB — BASIC METABOLIC PANEL
ANION GAP: 12 (ref 5–15)
BUN: 68 mg/dL — ABNORMAL HIGH (ref 6–20)
CALCIUM: 9.4 mg/dL (ref 8.9–10.3)
CO2: 29 mmol/L (ref 22–32)
Chloride: 93 mmol/L — ABNORMAL LOW (ref 101–111)
Creatinine, Ser: 2.87 mg/dL — ABNORMAL HIGH (ref 0.44–1.00)
GFR, EST AFRICAN AMERICAN: 17 mL/min — AB (ref >60)
GFR, EST NON AFRICAN AMERICAN: 15 mL/min — AB (ref >60)
Glucose, Bld: 217 mg/dL — ABNORMAL HIGH (ref 65–99)
POTASSIUM: 3.2 mmol/L — AB (ref 3.5–5.1)
Sodium: 134 mmol/L — ABNORMAL LOW (ref 135–145)

## 2015-06-28 LAB — GLUCOSE, CAPILLARY
GLUCOSE-CAPILLARY: 166 mg/dL — AB (ref 65–99)
GLUCOSE-CAPILLARY: 252 mg/dL — AB (ref 65–99)
GLUCOSE-CAPILLARY: 104 mg/dL — AB (ref 65–99)
GLUCOSE-CAPILLARY: 160 mg/dL — AB (ref 65–99)
Glucose-Capillary: 240 mg/dL — ABNORMAL HIGH (ref 65–99)

## 2015-06-28 MED ORDER — ENSURE ENLIVE PO LIQD
237.0000 mL | Freq: Two times a day (BID) | ORAL | Status: DC
  Administered 2015-06-28 – 2015-06-29 (×2): 237 mL via ORAL

## 2015-06-28 MED ORDER — AZELASTINE HCL 0.1 % NA SOLN
2.0000 | Freq: Two times a day (BID) | NASAL | Status: DC
  Administered 2015-06-29: 2 via NASAL
  Filled 2015-06-28 (×2): qty 30

## 2015-06-28 MED ORDER — CETYLPYRIDINIUM CHLORIDE 0.05 % MT LIQD
7.0000 mL | Freq: Two times a day (BID) | OROMUCOSAL | Status: DC
  Administered 2015-06-28 – 2015-06-29 (×4): 7 mL via OROMUCOSAL

## 2015-06-28 MED ORDER — POTASSIUM CHLORIDE CRYS ER 20 MEQ PO TBCR
40.0000 meq | EXTENDED_RELEASE_TABLET | Freq: Once | ORAL | Status: AC
  Administered 2015-06-28: 40 meq via ORAL
  Filled 2015-06-28: qty 2

## 2015-06-28 MED ORDER — AMOXICILLIN-POT CLAVULANATE 500-125 MG PO TABS
1.0000 | ORAL_TABLET | Freq: Two times a day (BID) | ORAL | Status: DC
  Administered 2015-06-28 – 2015-06-29 (×4): 500 mg via ORAL
  Filled 2015-06-28 (×5): qty 1

## 2015-06-28 MED ORDER — INSULIN GLARGINE 100 UNIT/ML ~~LOC~~ SOLN
35.0000 [IU] | Freq: Every day | SUBCUTANEOUS | Status: DC
  Administered 2015-06-28 (×2): 35 [IU] via SUBCUTANEOUS
  Filled 2015-06-28 (×3): qty 0.35

## 2015-06-28 NOTE — Progress Notes (Signed)
Nutrition Brief Note  Patient identified on the Malnutrition Screening Tool (MST) Report  Wt Readings from Last 15 Encounters:  06/28/15 163 lb 8 oz (74.163 kg)  06/16/15 173 lb 8 oz (78.699 kg)  06/13/15 176 lb 3.2 oz (79.924 kg)  06/07/15 175 lb 12.8 oz (79.742 kg)  05/23/15 170 lb (77.111 kg)  05/19/15 172 lb 6.4 oz (78.2 kg)  05/17/15 172 lb 8 oz (78.245 kg)  04/21/15 171 lb (77.565 kg)  04/19/15 173 lb 12.8 oz (78.835 kg)  04/12/15 179 lb (81.194 kg)  04/11/15 179 lb (81.194 kg)  04/02/15 180 lb 8 oz (81.874 kg)  03/25/15 175 lb 4.8 oz (79.516 kg)  02/23/15 185 lb (83.915 kg)  02/10/15 187 lb 3.2 oz (84.913 kg)   [name] is a 81 y.o. female with h/o CHF. She recently had exacerbation with fluid on lungs and was seen by Dr. Haroldine Laws in the office on 11/3. He noted that she weighed 173 lbs at that time, upped her diuretics, and also noted in his office note that she had a very narrow theraputic window between 167 to 170 lbs. At her doctors office she was noted to weigh 164 lbs 4 days ago, she was started on augmentin for bronchitis as well at that time. Labs drawn at that time have come back showing a jump in her creatinine from 1.8 on 11/3 to 2.6 on 11/10. She was instructed to come in to the ED. Creatinine in ED today is 3.1 and her weight is 163.8 lbs.  Pt in with MD at time of visit. Per chart review, dry weight is between 167-170#. Pt's diuretic dose was recently increased. Per MD notes, weight changes related to fluid.  Body mass index is 31.68 kg/(m^2). Patient meets criteria for obesity, class I based on current BMI.   Current diet order is Heart Healthy, patient is consuming approximately n/a% of meals at this time. Labs and medications reviewed.   No nutrition interventions warranted at this time. If nutrition issues arise, please consult RD.   [name] A. Jimmye Norman, RD, LDN, CDE Pager: [phone] After hours Pager: [phone]

## 2015-06-28 NOTE — Progress Notes (Signed)
[facility] PROGRESS NOTE  [name] [id number] DOB: [birth year] DOA: 06/27/2015 PCP: Cari Caraway, MD  Summary 81 y.o. female with h/o CHF. She recently had exacerbation with fluid on lungs and was seen by Dr. Haroldine Laws in the office on 11/3. He noted that she weighed 173 lbs at that time, upped her diuretics, and also noted in his office note that she had a very narrow theraputic window between 167 to 170 lbs. At her doctors office she was noted to weigh 164 lbs 4 days ago, she was started on augmentin for bronchitis as well at that time. Labs drawn at that time have come back showing a jump in her creatinine from 1.8 on 11/3 to 2.6 on 11/10. She was instructed to come in to the ED. Creatinine in ED today is 3.1 and her weight is 163.8 lbs.  Assessment/Plan:  Principal Problem:   AKI (acute kidney injury) ([place]) secondary to overdiuresis. No signs of fluid overload. Continue IV fluids and watch carefully. improving Active Problems:   Atrial fibrillation (HCC)   Chronic diastolic heart failure ([place]): Compensated   Type 2 diabetes mellitus with other circulatory complications (HCC)   Chronic respiratory failure (HCC) Bronchitis   Started on antibiotics as an outpatient  Hypokalemia: replete  Code Status:  full Family Communication:  Daughter at bedside Disposition Plan:  Home tomorrow if stable. Patient has a funeral to go to on Thursday  Consultants:    Procedures:     Antibiotics:    HPI/Subjective: Denies dyspnea. No leg edema. Wants to go to her brother's funeral on Thursday. Requesting discharge tomorrow if stable.  Objective: Filed Vitals:   06/28/15 0932  BP: 98/50  Pulse: 68  Temp:   Resp:     Intake/Output Summary (Last 24 hours) at 06/28/15 1236 Last data filed at 06/28/15 1026  Gross per 24 hour  Intake    240 ml  Output    775 ml  Net   -535 ml   Filed Weights   06/27/15 2013 06/28/15 0025  Weight: 74.163 kg (163 lb 8  oz) 74.163 kg (163 lb 8 oz)    Exam:   General:  Alert, oriented. Breathing nonlabored.  Cardiovascular: irregular no MGR  Respiratory: occasional rales at bases. No WR  Abdomen: S, NT, ND  Ext: no CCE  Basic Metabolic Panel:  Recent Labs Lab 06/23/15 1453 06/27/15 2037 06/28/15 0504  NA 139 137 134*  K 3.8 4.3 3.2*  CL 98* 91* 93*  CO2 30 31 29   GLUCOSE 135* 283* 217*  BUN 58* 67* 68*  CREATININE 2.62* 3.10* 2.87*  CALCIUM 9.8 10.5* 9.4   Liver Function Tests: No results for input(s): AST, ALT, ALKPHOS, BILITOT, PROT, ALBUMIN in the last 168 hours. No results for input(s): LIPASE, AMYLASE in the last 168 hours. No results for input(s): AMMONIA in the last 168 hours. CBC:  Recent Labs Lab 06/27/15 2037  WBC 7.8  HGB 15.0  HCT 47.3*  MCV 94.4  PLT 200   Cardiac Enzymes: No results for input(s): CKTOTAL, CKMB, CKMBINDEX, TROPONINI in the last 168 hours. BNP (last 3 results)  Recent Labs  03/28/15 2018 04/19/15 1214 05/17/15 1115  BNP 1496.2* 1411.2* 1045.0*    ProBNP (last 3 results)  Recent Labs  04/11/15 1026  PROBNP 1411.0*    CBG:  Recent Labs Lab 06/28/15 0326 06/28/15 0750 06/28/15 1214  GLUCAP 240* 166* 252*    No results found for this or any previous visit (from the  past 240 hour(s)).   Studies: Dg Chest 2 View  06/27/2015  CLINICAL DATA:  Acute onset of shortness of breath. Initial encounter. EXAM: CHEST  2 VIEW COMPARISON:  Chest radiograph performed 03/28/2015 FINDINGS: The lungs are well-aerated. Mild bibasilar atelectasis is noted. There is no evidence of pleural effusion or pneumothorax. The heart is borderline normal in size. No acute osseous abnormalities are seen. A right humeral head prosthesis is grossly unremarkable in appearance, though incompletely imaged. Diffuse vascular calcifications are seen. Clips are noted within the right upper quadrant, reflecting prior cholecystectomy. IMPRESSION: Mild bibasilar  atelectasis noted.  Lungs otherwise grossly clear. Electronically Signed   By: Garald Balding M.D.   On: 06/27/2015 21:01    Scheduled Meds: . allopurinol  100 mg Oral Daily  . amLODipine  2.5 mg Oral Daily  . amoxicillin-clavulanate  1 tablet Oral Q12H  . antiseptic oral rinse  7 mL Mouth Rinse BID  . apixaban  2.5 mg Oral BID  . budesonide  0.25 mg Nebulization Daily  . calcitRIOL  0.25 mcg Oral Daily  . diltiazem  120 mg Oral Daily  . feeding supplement (ENSURE ENLIVE)  237 mL Oral BID BM  . guaiFENesin  600 mg Oral Daily  . insulin aspart  0-15 Units Subcutaneous TID WC  . insulin glargine  20 Units Subcutaneous Daily  . insulin glargine  35 Units Subcutaneous QHS  . isosorbide mononitrate  30 mg Oral Daily  . [START ON 06/29/2015] levothyroxine  25 mcg Oral Once per day on Sun Mon Wed Fri  . levothyroxine  50 mcg Oral Once per day on Tue Thu Sat  . methocarbamol  500 mg Oral QHS  . metoprolol  50 mg Oral BID  . oxybutynin  10 mg Oral Daily  . rosuvastatin  20 mg Oral Daily   Continuous Infusions:   Time spent: 35 minutes  [facility] [email or web address], password [facility] 06/28/2015, 12:36 PM  LOS: 1 day

## 2015-06-28 NOTE — Discharge Instructions (Addendum)
Follow with Primary MD MCNEILL,WENDY, MD in 2-3 days   Get CBC, CMP, 2 view Chest X ray checked  by Primary MD next visit.    Activity: As tolerated with Full fall precautions use walker/cane & assistance as needed   Disposition Home    Diet: Heart Healthy Check your Weight same time everyday, if you gain over 2 pounds, or you develop in leg swelling, experience more shortness of breath or chest pain, call your Primary MD immediately. Follow Cardiac Low Salt Diet and 1.5 lit/day fluid restriction.   On your next visit with your primary care physician please Get Medicines reviewed and adjusted.   Please request your Prim.MD to go over all Hospital Tests and Procedure/Radiological results at the follow up, please get all Hospital records sent to your Prim MD by signing hospital release before you go home.   If you experience worsening of your admission symptoms, develop shortness of breath, life threatening emergency, suicidal or homicidal thoughts you must seek medical attention immediately by calling 911 or calling your MD immediately  if symptoms less severe.  You Must read complete instructions/literature along with all the possible adverse reactions/side effects for all the Medicines you take and that have been prescribed to you. Take any new Medicines after you have completely understood and accpet all the possible adverse reactions/side effects.   Do not drive, operating heavy machinery, perform activities at heights, swimming or participation in water activities or provide baby sitting services if your were admitted for syncope or siezures until you have seen by Primary MD or a Neurologist and advised to do so again.  Do not drive when taking Pain medications.    Do not take more than prescribed Pain, Sleep and Anxiety Medications  Special Instructions: If you have smoked or chewed Tobacco  in the last 2 yrs please stop smoking, stop any regular Alcohol  and or any Recreational  drug use.  Wear Seat belts while driving.   Please note  You were cared for by a hospitalist during your hospital stay. If you have any questions about your discharge medications or the care you received while you were in the hospital after you are discharged, you can call the unit and asked to speak with the hospitalist on call if the hospitalist that took care of you is not available. Once you are discharged, your primary care physician will handle any further medical issues. Please note that NO REFILLS for any discharge medications will be authorized once you are discharged, as it is imperative that you return to your primary care physician (or establish a relationship with a primary care physician if you do not have one) for your aftercare needs so that they can reassess your need for medications and monitor your lab values.

## 2015-06-28 NOTE — Progress Notes (Signed)
BP meds held this am. BP 98/60 HR 68. Made Dr Conley Canal aware.

## 2015-06-29 DIAGNOSIS — N179 Acute kidney failure, unspecified: Principal | ICD-10-CM

## 2015-06-29 LAB — BASIC METABOLIC PANEL
ANION GAP: 10 (ref 5–15)
BUN: 55 mg/dL — ABNORMAL HIGH (ref 6–20)
CALCIUM: 9.9 mg/dL (ref 8.9–10.3)
CHLORIDE: 99 mmol/L — AB (ref 101–111)
CO2: 29 mmol/L (ref 22–32)
CREATININE: 1.97 mg/dL — AB (ref 0.44–1.00)
GFR calc non Af Amer: 23 mL/min — ABNORMAL LOW (ref >60)
GFR, EST AFRICAN AMERICAN: 27 mL/min — AB (ref >60)
GLUCOSE: 226 mg/dL — AB (ref 65–99)
Potassium: 3.9 mmol/L (ref 3.5–5.1)
Sodium: 138 mmol/L (ref 135–145)

## 2015-06-29 LAB — GLUCOSE, CAPILLARY
GLUCOSE-CAPILLARY: 238 mg/dL — AB (ref 65–99)
Glucose-Capillary: 209 mg/dL — ABNORMAL HIGH (ref 65–99)

## 2015-06-29 MED ORDER — FUROSEMIDE 20 MG PO TABS: 60.0000 mg | ORAL_TABLET | Freq: Two times a day (BID) | ORAL | Status: DC

## 2015-06-29 NOTE — Evaluation (Signed)
Physical Therapy Evaluation Patient Details Name: [name] MRN: [id number] DOB: 1933-11-26 Today's Date: 06/29/2015   History of Present Illness  Patient is a 81 y/o female admitted from PCP office with acute kidney injury.  PMH includes CHF, COPD, DM, DVT, CVA.  Clinical Impression  Patient presents with generalized weakness and DOE impacting mobility. Sp02 dropped to 89% on 3L/min 02. Tolerated ambulation with Min guard assist for safety. Anticipate once pt increases activity level, strength and mobility will improve. Pt has support from spouse and family at home. Will follow acutely to maximize independence and mobility prior to return home.     Follow Up Recommendations No PT follow up;Supervision for mobility/OOB    Equipment Recommendations  None recommended by PT    Recommendations for Other Services       Precautions / Restrictions Precautions Precautions: Fall Restrictions Weight Bearing Restrictions: No      Mobility  Bed Mobility Overal bed mobility: Needs Assistance Bed Mobility: Supine to Sit;Sit to Supine     Supine to sit: Modified independent (Device/Increase time);HOB elevated Sit to supine: Modified independent (Device/Increase time)      Transfers Overall transfer level: Needs assistance Equipment used: Rolling walker (2 wheeled) Transfers: Sit to/from Stand Sit to Stand: Supervision         General transfer comment: Supervision for safety.   Ambulation/Gait Ambulation/Gait assistance: Min guard Ambulation Distance (Feet): 125 Feet Assistive device: Rolling walker (2 wheeled) Gait Pattern/deviations: Step-through pattern;Decreased stride length   Gait velocity interpretation: <1.8 ft/sec, indicative of risk for recurrent falls General Gait Details: Slow, steady gait with 1 standing rest break  due to DOE. SP02 dropped to 89% on 3L/min 02. Resolved quickly.  Stairs            Wheelchair Mobility    Modified Rankin  (Stroke Patients Only)       Balance Overall balance assessment: Needs assistance Sitting-balance support: Feet supported;No upper extremity supported Sitting balance-Leahy Scale: Good     Standing balance support: During functional activity Standing balance-Leahy Scale: Poor Standing balance comment: Relient on UEs for support.                             Pertinent Vitals/Pain Pain Assessment: No/denies pain    Home Living Family/patient expects to be discharged to:: Private residence Living Arrangements: Spouse/significant other Available Help at Discharge: Family;Available 24 hours/day Type of Home: House Home Access: Stairs to enter Entrance Stairs-Rails: None Entrance Stairs-Number of Steps: 1 Home Layout: Multi-level;Bed/bath upstairs Home Equipment: Walker - 2 wheels;Walker - 4 wheels;Shower seat;Wheelchair - manual;Bedside commode      Prior Function Level of Independence: Independent with assistive device(s)         Comments: walker inside and rollator outside, sister in law helps with housekeeping and some meals     Hand Dominance   Dominant Hand: Right    Extremity/Trunk Assessment   Upper Extremity Assessment: Defer to OT evaluation           Lower Extremity Assessment: Generalized weakness         Communication   Communication: No difficulties  Cognition Arousal/Alertness: Awake/alert Behavior During Therapy: WFL for tasks assessed/performed Overall Cognitive Status: Within Functional Limits for tasks assessed                      General Comments General comments (skin integrity, edema, etc.): Spouse present during session.  Exercises        Assessment/Plan    PT Assessment Patient needs continued PT services  PT Diagnosis Difficulty walking;Generalized weakness   PT Problem List Decreased strength;Pain;Cardiopulmonary status limiting activity;Decreased activity tolerance;Decreased balance;Decreased  mobility  PT Treatment Interventions Balance training;Gait training;Stair training;Functional mobility training;Therapeutic activities;Therapeutic exercise;Patient/family education   PT Goals (Current goals can be found in the Care Plan section) Acute Rehab PT Goals Patient Stated Goal: to go home PT Goal Formulation: With patient/family Time For Goal Achievement: 07/13/15 Potential to Achieve Goals: Good    Frequency Min 3X/week   Barriers to discharge        Co-evaluation               End of Session Equipment Utilized During Treatment: Gait belt;Oxygen Activity Tolerance: Patient tolerated treatment well Patient left: in bed;with call bell/phone within reach;with bed alarm set;with family/visitor present Nurse Communication: Mobility status         Time: 1430-1450 PT Time Calculation (min) (ACUTE ONLY): 20 min   Charges:   PT Evaluation $Initial PT Evaluation Tier I: 1 Procedure     PT G Codes:        Akon Reinoso A Irving Bloor 06/29/2015, 2:59 PM Wray Kearns, [place], DPT [phone]

## 2015-06-29 NOTE — Discharge Summary (Signed)
[name], is a 81 y.o. female  DOB 1934/01/21  MRN [id number].  Admission date:  06/27/2015  Admitting Physician  Etta Quill, DO  Discharge Date:  06/29/2015   Primary MD  Cari Caraway, MD  Recommendations for primary care physician for things to follow:   Monitor Weight, diuretic dose and BMP in 2-3 days.   Admission Diagnosis  Acute on chronic renal failure (HCC) [N17.9, N18.9]   Discharge Diagnosis  Acute on chronic renal failure ([place]) [N17.9, N18.9]    Principal Problem:   AKI (acute kidney injury) ([place]) Active Problems:   Atrial fibrillation (HCC)   Chronic diastolic heart failure ([place])   Type 2 diabetes mellitus with other circulatory complications (HCC)   Chronic respiratory failure ([place])   Bronchitis      Past Medical History  Diagnosis Date  . CAD (coronary artery disease)   . CHF (congestive heart failure) ([place])   . Paroxysmal atrial fibrillation (HCC)   . Bradycardia   . Hypertension   . Renal artery stenosis ([place])   . COPD (chronic obstructive pulmonary disease) ([place])   . Obesity hypoventilation syndrome ([place])   . Pruritus   . Tracheobronchitis   . Acute rhinosinusitis   . Anemia   . Hypoxemia   . Peripheral edema   . Obesity, endogenous   . Diabetes mellitus   . Acid reflux disease   . Osteoarthritis, generalized   . Hypothyroidism   . Lung nodule     lingula  . OSA (obstructive sleep apnea)     NPSG 04/03/00--AHI 28.hr  . DVT (deep venous thrombosis) ([place]) 2003    left leg  . Vaginal atrophy   . Hx of colonic polyps   . Acute diastolic heart failure ([place])   . Secondary pulmonary hypertension ([place]) 06/09/2013  . Blood transfusion without reported diagnosis 2009    had internal bleeding  . Cancer ([place]) 1959    ovarian cancer  . Osteoporosis   . Stroke  [facility])     Past Surgical History  Procedure Laterality Date  . Lumbar spine surgery    . Cholecystectomy    . Hemorroidectomy    . Repair wound fistula    . Right shoulder    . Elbow surgery    . Dilation and curettage of uterus    . Breast surgery  1998    lumpectomy  . Oophorectomy  1959    BSO-? ovarian cancer  . Bilateral salpingoophorectomy  1959    -d/t ovarian cancer  . Rotator cuff repair Right   . Cardiac catheterization N/A 03/21/2015    Procedure: Right Heart Cath;  Surgeon: Jolaine Artist, MD;  Location: [place] [facility];  Service: Cardiovascular;  Laterality: N/A;       HPI  from the history and physical done on the day of admission:    [name] is a 81 y.o. female with h/o CHF. She recently had exacerbation with fluid on lungs and was seen by Dr. Haroldine Laws in the  office on 11/3. He noted that she weighed 173 lbs at that time, upped her diuretics, and also noted in his office note that she had a very narrow theraputic window between 167 to 170 lbs. At her doctors office she was noted to weigh 164 lbs 4 days ago, she was started on augmentin for bronchitis as well at that time. Labs drawn at that time have come back showing a jump in her creatinine from 1.8 on 11/3 to 2.6 on 11/10. She was instructed to come in to the ED. Creatinine in ED today is 3.1 and her weight is 163.8 lbs.     Hospital Course:    1. ARF on chronic kidney disease stage IV due to overdiuresis and dehydration. Baseline creatinine close to 2, had recent increase in her diuretic dose, diuretic was held she was gently hydrated, dry weight is 161 pounds which is still better than her cold of 167 pounds. Creatinine is improved and now close to baseline. Will be discharged home on Lasix 60 mg twice a day from 80 mg twice a day, continue Zaroxolyn on an as-needed basis along with potassium supplementation on a daily basis. Requested to follow with her primary care physician and  cardiologist on a close basis. We will get home PT if she qualifies.   2. Recent outpatient diagnosis of bronchitis. Complete Augmentin course.   3. DM type II. Low-carb diet and continue home regimen upon discharge.   4. Chronic diastolic CHF with EF [id number] on recent echogram. Currently below her baseline weight, diuretic dose adjusted as above, requested to follow with CHF clinic within a week post discharge. Written instructions on fluid intake and daily weight check provided.   5. Chronic atrial fibrillation. [place] Vasc score of greater than 3. Continue rate control medications along with Eliquis.   6. Chronic pulmonary hypertension due to underlying COPD and OSA. He knew chronic oxygen supplementation which is 2 L nasal cannula per minute, continue outpatient follow-up with PCP, CHF clinic and pulmonary physician as needed. Last PA artery pressure is 68 mmHg.   Discharge Condition: Stable  Follow UP  Follow-up Information    Follow up with MCNEILL,WENDY, MD. Schedule an appointment as soon as possible for a visit in 2 days.   Specialty:  Family Medicine   Contact information:   [place] [address] [phone]       Follow up with Glori Bickers, MD. Schedule an appointment as soon as possible for a visit in 3 days.   Specialty:  Cardiology   Why:  or CHF clinic   Contact information:   [address] [place] [address] [phone]        Consults obtained -  None  Diet and Activity recommendation: See Discharge Instructions below  Discharge Instructions       Discharge Instructions    Discharge instructions    Complete by:  As directed   Follow with Primary MD MCNEILL,WENDY, MD in 2-3 days   Get CBC, CMP, 2 view Chest X ray checked  by Primary MD next visit.    Activity: As tolerated with Full fall precautions use walker/cane & assistance as needed   Disposition Home    Diet: Heart Healthy - Low Carb,  Check  your Weight same time everyday, if you gain over 2 pounds, or you develop in leg swelling, experience more shortness of breath or chest pain, call your Primary MD immediately. Follow Cardiac Low Salt Diet  and 1.5 lit/day fluid restriction.   On your next visit with your primary care physician please Get Medicines reviewed and adjusted.   Please request your Prim.MD to go over all Hospital Tests and Procedure/Radiological results at the follow up, please get all Hospital records sent to your Prim MD by signing hospital release before you go home.   If you experience worsening of your admission symptoms, develop shortness of breath, life threatening emergency, suicidal or homicidal thoughts you must seek medical attention immediately by calling 911 or calling your MD immediately  if symptoms less severe.  You Must read complete instructions/literature along with all the possible adverse reactions/side effects for all the Medicines you take and that have been prescribed to you. Take any new Medicines after you have completely understood and accpet all the possible adverse reactions/side effects.   Do not drive, operating heavy machinery, perform activities at heights, swimming or participation in water activities or provide baby sitting services if your were admitted for syncope or siezures until you have seen by Primary MD or a Neurologist and advised to do so again.  Do not drive when taking Pain medications.    Do not take more than prescribed Pain, Sleep and Anxiety Medications  Special Instructions: If you have smoked or chewed Tobacco  in the last 2 yrs please stop smoking, stop any regular Alcohol  and or any Recreational drug use.  Wear Seat belts while driving.   Please note  You were cared for by a hospitalist during your hospital stay. If you have any questions about your discharge medications or the care you received while you were in the hospital after you are discharged, you can  call the unit and asked to speak with the hospitalist on call if the hospitalist that took care of you is not available. Once you are discharged, your primary care physician will handle any further medical issues. Please note that NO REFILLS for any discharge medications will be authorized once you are discharged, as it is imperative that you return to your primary care physician (or establish a relationship with a primary care physician if you do not have one) for your aftercare needs so that they can reassess your need for medications and monitor your lab values.     Increase activity slowly    Complete by:  As directed              Discharge Medications       Medication List    TAKE these medications        acetaminophen 500 MG tablet  Commonly known as:  TYLENOL  Take 1,000 mg by mouth every 6 (six) hours as needed.     albuterol 108 (90 BASE) MCG/ACT inhaler  Commonly known as:  PROVENTIL HFA;VENTOLIN HFA  Inhale 1 puff into the lungs every 4 (four) hours as needed for wheezing or shortness of breath.     albuterol (2.5 MG/3ML) 0.083% nebulizer solution  Commonly known as:  PROVENTIL  Take 3 mLs (2.5 mg total) by nebulization every 2 (two) hours as needed for wheezing or shortness of breath.     allopurinol 100 MG tablet  Commonly known as:  ZYLOPRIM  Take 1 tablet (100 mg total) by mouth daily.     amLODipine 2.5 MG tablet  Commonly known as:  NORVASC  Take 1 tablet (2.5 mg total) by mouth daily.     apixaban 5 MG Tabs tablet  Commonly known as:  ELIQUIS  Take 1 tablet (5 mg total) by mouth 2 (two) times daily.     azelastine 0.1 % nasal spray  Commonly known as:  ASTELIN  1-2 sprays each nostril once or twice daily     bisacodyl 5 MG EC tablet  Commonly known as:  DULCOLAX  Take 5 mg by mouth daily as needed for moderate constipation.     budesonide 0.25 MG/2ML nebulizer solution  Commonly known as:  PULMICORT  Take 2 mLs (0.25 mg total) by nebulization  daily.     calcitRIOL 0.25 MCG capsule  Commonly known as:  ROCALTROL  Take 0.25 mcg by mouth daily.     colchicine 0.6 MG tablet  Take 1.2 mg by mouth daily as needed (for gout flareups).     CRESTOR 20 MG tablet  Generic drug:  rosuvastatin  Take 20 mg by mouth daily.     diltiazem 120 MG 24 hr capsule  Commonly known as:  CARDIZEM CD  Take 1 capsule (120 mg total) by mouth daily.     DITROPAN XL 10 MG 24 hr tablet  Generic drug:  oxybutynin  Take 10 mg by mouth daily.     furosemide 20 MG tablet  Commonly known as:  LASIX  Take 3 tablets (60 mg total) by mouth 2 (two) times daily. Take 80 mg in AM and 80 mg in PM     guaiFENesin 600 MG 12 hr tablet  Commonly known as:  MUCINEX  Take 600 mg by mouth daily.     insulin glargine 100 UNIT/ML injection  Commonly known as:  LANTUS  Inject 20-35 Units into the skin 2 (two) times daily. Inject 20 units in the morning and 35 units in the evening     insulin lispro 100 UNIT/ML injection  Commonly known as:  HUMALOG  Inject 2 Units into the skin 2 (two) times daily with breakfast and lunch. Per sliding scale. Patient unsure about how much she really takes     isosorbide mononitrate 30 MG 24 hr tablet  Commonly known as:  IMDUR  Take 30 mg by mouth daily.     levothyroxine 50 MCG tablet  Commonly known as:  SYNTHROID, LEVOTHROID  Take 50 mcg by mouth See admin instructions. Take 50mg  on Tues, Thurs, Sat     levothyroxine 25 MCG tablet  Commonly known as:  SYNTHROID, LEVOTHROID  Take 25 mcg by mouth See admin instructions. Take 1 tablet (25 mcg) on MON, WED, FRI, SUN     methocarbamol 500 MG tablet  Commonly known as:  ROBAXIN  Take 500 mg by mouth at bedtime.     metolazone 2.5 MG tablet  Commonly known as:  ZAROXOLYN  Take 1 tablet (2.5 mg total) by mouth as needed.     metoprolol 50 MG tablet  Commonly known as:  LOPRESSOR  Take 50 mg by mouth 2 (two) times daily.     nitroGLYCERIN 0.4 MG SL tablet  Commonly  known as:  NITROSTAT  Place 0.4 mg under the tongue every 5 (five) minutes as needed for chest pain.     potassium chloride SA 20 MEQ tablet  Commonly known as:  KLOR-CON M20  Take 1 tablet (20 mEq total) by mouth daily. Take an extra tab on Mon and Fri with Metolazone     traMADol 50 MG tablet  Commonly known as:  ULTRAM  Take 50 mg by mouth every 6 (six) hours as needed for moderate pain.  Major procedures and Radiology Reports - PLEASE review detailed and final reports for all details, in brief -     Dg Chest 2 View  06/27/2015  CLINICAL DATA:  Acute onset of shortness of breath. Initial encounter. EXAM: CHEST  2 VIEW COMPARISON:  Chest radiograph performed 03/28/2015 FINDINGS: The lungs are well-aerated. Mild bibasilar atelectasis is noted. There is no evidence of pleural effusion or pneumothorax. The heart is borderline normal in size. No acute osseous abnormalities are seen. A right humeral head prosthesis is grossly unremarkable in appearance, though incompletely imaged. Diffuse vascular calcifications are seen. Clips are noted within the right upper quadrant, reflecting prior cholecystectomy. IMPRESSION: Mild bibasilar atelectasis noted.  Lungs otherwise grossly clear. Electronically Signed   By: Garald Balding M.D.   On: 06/27/2015 21:01   Micro Results   No results found for this or any previous visit (from the past 240 hour(s)).  Today   Subjective    [name] today has no headache,no chest abdominal pain,no new weakness tingling or numbness, feels much better wants to go home today.     Objective   Blood pressure 130/81, pulse 106, temperature 97.7 F (36.5 C), temperature source Oral, resp. rate 18, height 5' 0.25" (1.53 m), weight 73.392 kg (161 lb 12.8 oz), last menstrual period 08/13/1957, SpO2 98 %.   Intake/Output Summary (Last 24 hours) at 06/29/15 0943 Last data filed at 06/29/15 0818  Gross per 24 hour  Intake    992 ml  Output   2800  ml  Net  -1808 ml    Exam Awake Alert, Oriented x 3, No new F.N deficits, Normal affect [place].AT,PERRAL Supple Neck,No JVD, No cervical lymphadenopathy appriciated.  Symmetrical Chest wall movement, Good air movement bilaterally, CTAB RRR,No Gallops,Rubs or new Murmurs, No Parasternal Heave +ve B.Sounds, Abd Soft, Non tender, No organomegaly appriciated, No rebound -guarding or rigidity. No Cyanosis, Clubbing or edema, No new Rash or bruise   Data Review   CBC w Diff: Lab Results  Component Value Date   WBC 7.8 06/27/2015   HGB 15.0 06/27/2015   HCT 47.3* 06/27/2015   PLT 200 06/27/2015   LYMPHOPCT 16 03/17/2015   MONOPCT 7 03/17/2015   EOSPCT 3 03/17/2015   BASOPCT 0 03/17/2015    CMP: Lab Results  Component Value Date   NA 138 06/29/2015   K 3.9 06/29/2015   CL 99* 06/29/2015   CO2 29 06/29/2015   BUN 55* 06/29/2015   CREATININE 1.97* 06/29/2015   PROT 5.3* 03/29/2015   ALBUMIN 2.9* 03/29/2015   BILITOT 0.7 03/29/2015   ALKPHOS 61 03/29/2015   AST 25 03/29/2015   ALT 43 03/29/2015  .   Total Time in preparing paper work, data evaluation and todays exam - 35 minutes  Thurnell Lose M.D on 06/29/2015 at 9:43 AM  [facility]   Office  [phone]

## 2015-06-29 NOTE — Progress Notes (Signed)
Patient was discharged home by MD order; discharged instructions  review and give to patient and her husband with care notes and prescriptions; IV DIC; skin intact; patient will be escorted to the car by nurse tech via wheelchair.

## 2015-06-29 NOTE — Care Management Note (Signed)
Case Management Note  Patient Details  Name: [name] MRN: [id number] Date of Birth: [birth year]  Subjective/Objective:   Date: 06/29/15 Spoke with patient at the bedside along with daughter.  Introduced self as Tourist information centre manager and explained role in discharge planning and how to be reached.  Verified patient lives in town with spouse. Has two rolling  walker , a chair lift and electric buggy, and home  oxygen through [facility].  Expressed potential need for no other DME.  Verified patient anticipates to go home with family, at time of discharge and will have full-time supervision by familyat this time to best of their knowledge. Patient  denied needing help with their medication.  Patient  is driven by daughter to MD appointments. Verified patient has PCP Cari Caraway, patient is active with [facility], MD has ordered aide and hhpt for patient as well, patient states she only wants to continue with the [facility] with [facility].  This information was given to [facility] with [facility] and notified her that patient is for dc today.  Daughter has patient's oxygen in the car , she will bring up to patient's room.    Plan: CM will continue to follow for discharge planning and [facility] resources.                  Action/Plan:   Expected Discharge Date:                  Expected Discharge Plan:  [place]  In-House Referral:     Discharge planning Services  CM Consult  Post Acute Care Choice:  Resumption of Svcs/PTA Provider Choice offered to:  Patient  DME Arranged:    DME Agency:     HH Arranged:  RN [place] Agency:  [place]  Status of Service:     Medicare Important Message Given:    Date Medicare IM Given:    Medicare IM give by:    Date Additional Medicare IM Given:    Additional Medicare Important Message give by:     If discussed at [place] of Stay Meetings, dates discussed:    Additional Comments:  Zenon Mayo, RN 06/29/2015, 11:15 AM

## 2015-07-05 ENCOUNTER — Ambulatory Visit (HOSPITAL_COMMUNITY)
Admission: RE | Admit: 2015-07-05 | Discharge: 2015-07-05 | Disposition: A | Discharge status: Home / Self Care | Payer: Medicare Other | Source: Ambulatory Visit | Attending: Internal Medicine | Admitting: Internal Medicine

## 2015-07-05 ENCOUNTER — Encounter (HOSPITAL_COMMUNITY): Payer: Self-pay | Admitting: Internal Medicine

## 2015-07-05 VITALS — BP 126/66 | HR 83 | Wt 169.5 lb

## 2015-07-05 DIAGNOSIS — E1122 Type 2 diabetes mellitus with diabetic chronic kidney disease: Secondary | ICD-10-CM | POA: Diagnosis not present

## 2015-07-05 DIAGNOSIS — Z87891 Personal history of nicotine dependence: Secondary | ICD-10-CM | POA: Insufficient documentation

## 2015-07-05 DIAGNOSIS — Z8543 Personal history of malignant neoplasm of ovary: Secondary | ICD-10-CM | POA: Diagnosis not present

## 2015-07-05 DIAGNOSIS — Z79899 Other long term (current) drug therapy: Secondary | ICD-10-CM | POA: Insufficient documentation

## 2015-07-05 DIAGNOSIS — Z794 Long term (current) use of insulin: Secondary | ICD-10-CM | POA: Diagnosis not present

## 2015-07-05 DIAGNOSIS — I480 Paroxysmal atrial fibrillation: Secondary | ICD-10-CM | POA: Diagnosis not present

## 2015-07-05 DIAGNOSIS — I701 Atherosclerosis of renal artery: Secondary | ICD-10-CM | POA: Insufficient documentation

## 2015-07-05 DIAGNOSIS — E662 Morbid (severe) obesity with alveolar hypoventilation: Secondary | ICD-10-CM | POA: Insufficient documentation

## 2015-07-05 DIAGNOSIS — Z7901 Long term (current) use of anticoagulants: Secondary | ICD-10-CM | POA: Insufficient documentation

## 2015-07-05 DIAGNOSIS — I5033 Acute on chronic diastolic (congestive) heart failure: Secondary | ICD-10-CM | POA: Diagnosis not present

## 2015-07-05 DIAGNOSIS — N184 Chronic kidney disease, stage 4 (severe): Secondary | ICD-10-CM | POA: Diagnosis not present

## 2015-07-05 DIAGNOSIS — G4733 Obstructive sleep apnea (adult) (pediatric): Secondary | ICD-10-CM | POA: Insufficient documentation

## 2015-07-05 DIAGNOSIS — J449 Chronic obstructive pulmonary disease, unspecified: Secondary | ICD-10-CM | POA: Insufficient documentation

## 2015-07-05 DIAGNOSIS — I824Z2 Acute embolism and thrombosis of unspecified deep veins of left distal lower extremity: Secondary | ICD-10-CM | POA: Diagnosis not present

## 2015-07-05 DIAGNOSIS — I482 Chronic atrial fibrillation, unspecified: Secondary | ICD-10-CM

## 2015-07-05 DIAGNOSIS — K219 Gastro-esophageal reflux disease without esophagitis: Secondary | ICD-10-CM | POA: Insufficient documentation

## 2015-07-05 DIAGNOSIS — R001 Bradycardia, unspecified: Secondary | ICD-10-CM | POA: Diagnosis not present

## 2015-07-05 DIAGNOSIS — R911 Solitary pulmonary nodule: Secondary | ICD-10-CM | POA: Diagnosis not present

## 2015-07-05 DIAGNOSIS — I2510 Atherosclerotic heart disease of native coronary artery without angina pectoris: Secondary | ICD-10-CM | POA: Diagnosis not present

## 2015-07-05 DIAGNOSIS — I5031 Acute diastolic (congestive) heart failure: Secondary | ICD-10-CM | POA: Diagnosis not present

## 2015-07-05 DIAGNOSIS — E039 Hypothyroidism, unspecified: Secondary | ICD-10-CM | POA: Insufficient documentation

## 2015-07-05 DIAGNOSIS — I5032 Chronic diastolic (congestive) heart failure: Secondary | ICD-10-CM

## 2015-07-05 DIAGNOSIS — I129 Hypertensive chronic kidney disease with stage 1 through stage 4 chronic kidney disease, or unspecified chronic kidney disease: Secondary | ICD-10-CM | POA: Insufficient documentation

## 2015-07-05 DIAGNOSIS — R0989 Other specified symptoms and signs involving the circulatory and respiratory systems: Secondary | ICD-10-CM | POA: Diagnosis not present

## 2015-07-05 DIAGNOSIS — I272 Other secondary pulmonary hypertension: Secondary | ICD-10-CM | POA: Insufficient documentation

## 2015-07-05 DIAGNOSIS — Z8673 Personal history of transient ischemic attack (TIA), and cerebral infarction without residual deficits: Secondary | ICD-10-CM | POA: Insufficient documentation

## 2015-07-05 MED ORDER — METOLAZONE 2.5 MG PO TABS: 2.5000 mg | ORAL_TABLET | ORAL | Status: DC | PRN

## 2015-07-05 MED ORDER — FUROSEMIDE 20 MG PO TABS: 60.0000 mg | ORAL_TABLET | Freq: Two times a day (BID) | ORAL | Status: DC

## 2015-07-05 MED ORDER — POTASSIUM CHLORIDE CRYS ER 20 MEQ PO TBCR: 20.0000 meq | EXTENDED_RELEASE_TABLET | Freq: Every day | ORAL | Status: DC

## 2015-07-05 NOTE — Progress Notes (Signed)
Patient ID: [name], female   DOB: 03-21-1934, 81 y.o.   MRN: [id number]   Advanced Heart Failure Clinic Note   Patient ID: [name], female   DOB: 08-06-1934, 81 y.o.   MRN: [id number] PCP: Dr Annamaria Boots  Primary HF Cardiologist: Dr Vaughan Browner  HPI: [name] is a 81 y.o. female with history of chronic diastolic HF with RV dysfunction, OSA, COPD, pulmonary hypertension, chronic rate controlled A fib, type 2 diabetes.  She was admitted to [facility] on 03/16/15 when she presented with with worsening SOB, orthopnea, and PND. BNP 1109. CXR showing pulmonary congestion. Underwent 2d echo and right heart catheterization as below. Discharge weight 175, and diuretic regimen was decreased from Lasix 40 BID to 60 daily.  Admitted 03/16/15 with increased dyspnea. Diuresed with IV lasix and transitioned to lasix 40 mg daily. Has AKI noted on admit with creatinine up to 2.6. Creatinine down to 1.65 on the day of discharge. Discharge weight was 180 pounds.   Admitted 06/27/15 with AKI. Lasix had recently been increase and pt has narrow euvolemic window of 167-170. Was 164 at PCP. Labs at PCP showed Cr 1.8 -> 2.6 (06/23/15). Creatinine was 3.1 on arrival to ED with weight 163.8. Discharge weight was 161 lb, despite holding of diuretics and gentle IVF. Lasix decreased to 60 mg BID.   She returns today for post hospital follow up. Feels OK since left hospital.  Doesn't feel like she is peeing very much.  Weight at home 166.2 this am, has crept up slightly since discharge, but can't remember weight at home.  By our scales, up 8 lbs from discharge. Has not been taking metolazone. Has mild dyspnea with walking short distances, "not too bad" walking from lobby to patient room.. Sleeps chronically on 2 pillows. Felt slightly lightheaded yesterday after bending over and standing up quickly, but no presyncope/syncope. Uses 2 liters oxygen continuously. Watches fluid intake and uses salt substitute. Dr  Leonides Schanz did labs on Friday. Currently on ABX for Bronchitis/ possible PNA per PCP.  Labs 07/02/15 K 4.1, Creatinine 2.01 (1.97 on discharge 06/29/15)  [facility] 03/21/15 RA = 14 RV = 60/10/14 PA = 65/30 (43) PCW = 20 Fick cardiac output/index = 3.8/2.1 PVR = 6.1 WU Arterial sat = 94% PA sat = 55%, 56%  Echo 03/17/15 EF 60-65%, RV severely dilated with moderately reduced systolic function. PA peak pressure 68 mm Hg.  Labs 8/16: K 4.1 Creatinine 1.80 BNP 1496 9/16: K 4.3, Creatinine 2.15 BNP 1411  ROS: All systems negative except as listed in HPI, PMH and Problem List.  SH:  Social History   Social History  . Marital Status: Married    Spouse Name: N/A  . Number of Children: 2  . Years of Education: N/A   Occupational History  . retired     Social History Main Topics  . Smoking status: Former Smoker    Quit date: 08/13/1978  . Smokeless tobacco: Never Used  . Alcohol Use: No  . Drug Use: No  . Sexual Activity: No   Other Topics Concern  . Not on file   Social History Narrative   Lives with husband       FH:  Family History  Problem Relation Age of Onset  . Pneumonia Mother   . Heart disease Mother   . Allergies Mother   . Emphysema Mother   . Arthritis Mother   . Hypertension Mother   . Heart attack Father   . Hypertension  Father   . Diabetes Sister   . Heart disease Sister   . Diabetes Brother   . Diabetes      grandfather  . Breast cancer      aunt  . Bone cancer      aunt  . Clotting disorder Brother   . Arthritis Sister   . Breast cancer Maternal Aunt     Past Medical History  Diagnosis Date  . CAD (coronary artery disease)   . CHF (congestive heart failure) ([place])   . Paroxysmal atrial fibrillation (HCC)   . Bradycardia   . Hypertension   . Renal artery stenosis ([place])   . COPD (chronic obstructive pulmonary disease) ([place])   . Obesity hypoventilation syndrome ([place])   . Pruritus   . Tracheobronchitis   . Acute rhinosinusitis   . Anemia     . Hypoxemia   . Peripheral edema   . Obesity, endogenous   . Diabetes mellitus   . Acid reflux disease   . Osteoarthritis, generalized   . Hypothyroidism   . Lung nodule     lingula  . OSA (obstructive sleep apnea)     NPSG 04/03/00--AHI 28.hr  . DVT (deep venous thrombosis) ([place]) 2003    left leg  . Vaginal atrophy   . Hx of colonic polyps   . Acute diastolic heart failure ([place])   . Secondary pulmonary hypertension ([place]) 06/09/2013  . Blood transfusion without reported diagnosis 2009    had internal bleeding  . Cancer ([place]) 1959    ovarian cancer  . Osteoporosis   . Stroke [facility])     Current Outpatient Prescriptions  Medication Sig Dispense Refill  . albuterol (PROVENTIL HFA;VENTOLIN HFA) 108 (90 BASE) MCG/ACT inhaler Inhale 1 puff into the lungs every 4 (four) hours as needed for wheezing or shortness of breath.    Marland Kitchen albuterol (PROVENTIL) (2.5 MG/3ML) 0.083% nebulizer solution Take 3 mLs (2.5 mg total) by nebulization every 2 (two) hours as needed for wheezing or shortness of breath. 75 mL 3  . allopurinol (ZYLOPRIM) 100 MG tablet Take 1 tablet (100 mg total) by mouth daily.    Marland Kitchen amLODipine (NORVASC) 2.5 MG tablet Take 1 tablet (2.5 mg total) by mouth daily. 90 tablet 3  . apixaban (ELIQUIS) 2.5 MG TABS tablet Take 2.5 mg by mouth 2 (two) times daily.    Marland Kitchen azelastine (ASTELIN) 137 MCG/SPRAY nasal spray 1-2 sprays each nostril once or twice daily 90 mL 3  . bisacodyl (DULCOLAX) 5 MG EC tablet Take 5 mg by mouth daily as needed for moderate constipation.    . budesonide (PULMICORT) 0.25 MG/2ML nebulizer solution Take 2 mLs (0.25 mg total) by nebulization daily. 60 mL prn  . calcitRIOL (ROCALTROL) 0.25 MCG capsule Take 0.25 mcg by mouth daily.     . CRESTOR 20 MG tablet Take 20 mg by mouth daily.     Marland Kitchen diltiazem (CARDIZEM CD) 120 MG 24 hr capsule Take 1 capsule (120 mg total) by mouth daily. 5 capsule 0  . furosemide (LASIX) 20 MG tablet Take 3 tablets (60 mg total) by mouth 2  (two) times daily. Take 80 mg in AM and 80 mg in PM 60 tablet 0  . guaiFENesin (MUCINEX) 600 MG 12 hr tablet Take 600 mg by mouth daily.    . insulin glargine (LANTUS) 100 UNIT/ML injection Inject 20-35 Units into the skin 2 (two) times daily. Inject 20 units in the morning and 35 units in the evening    .  insulin lispro (HUMALOG) 100 UNIT/ML injection Inject 2 Units into the skin 2 (two) times daily with breakfast and lunch. Per sliding scale. Patient unsure about how much she really takes    . isosorbide mononitrate (IMDUR) 30 MG 24 hr tablet Take 30 mg by mouth daily.    Marland Kitchen levothyroxine (SYNTHROID, LEVOTHROID) 25 MCG tablet Take 25 mcg by mouth See admin instructions. Take 1 tablet (25 mcg) on MON, WED, FRI, SUN    . levothyroxine (SYNTHROID, LEVOTHROID) 50 MCG tablet Take 50 mcg by mouth See admin instructions. Take 50mg  on Tues, Thurs, Sat    . methocarbamol (ROBAXIN) 500 MG tablet Take 500 mg by mouth at bedtime.    . metoprolol (LOPRESSOR) 50 MG tablet Take 50 mg by mouth 2 (two) times daily.    Marland Kitchen oxybutynin (DITROPAN XL) 10 MG 24 hr tablet Take 10 mg by mouth daily.     . potassium chloride SA (KLOR-CON M20) 20 MEQ tablet Take 1 tablet (20 mEq total) by mouth daily. Take an extra tab on Mon and Fri with Metolazone 90 tablet 3  . acetaminophen (TYLENOL) 500 MG tablet Take 1,000 mg by mouth every 6 (six) hours as needed.    . colchicine 0.6 MG tablet Take 1.2 mg by mouth daily as needed (for gout flareups).     . metolazone (ZAROXOLYN) 2.5 MG tablet Take 1 tablet (2.5 mg total) by mouth as needed. (Patient not taking: Reported on 07/05/2015) 10 tablet 3  . nitroGLYCERIN (NITROSTAT) 0.4 MG SL tablet Place 0.4 mg under the tongue every 5 (five) minutes as needed for chest pain.     . traMADol (ULTRAM) 50 MG tablet Take 50 mg by mouth every 6 (six) hours as needed for moderate pain.      No current facility-administered medications for this encounter.    Filed Vitals:   07/05/15 1353  BP:  126/66  Pulse: 83  Weight: 169 lb 8 oz (76.885 kg)  SpO2: 95%    PHYSICAL EXAM:  General:  Chronically ill appearing. No resp distress. HEENT: normal, On 2 liters [place].  Neck: supple. JVP 8 cm. Carotids 2+ bilaterally; no bruits. No thyromegaly or nodule noted. Cor: PMI normal. RRR. No M/G/R appreciated Lungs: diminished bilateral bases with slight crackles L>R Abdomen: obese, soft, NT, ND. No HSM. No bruits or masses. +BS Extremities: no cyanosis, clubbing, rash. Trace edema. Marked varicose veins. Neuro: alert & orientedx3, cranial nerves grossly intact. Moves all 4 extremities w/o difficulty. Affect pleasant.  ASSESSMENT & PLAN: 1. Acute on chronic diastolic CHF/right heart failure : Echo 03/17/15 EF 60-65%, RV severely dilated with moderately reduced systolic function. - NYHA III-IIIb. Volume status up again. - Has a narrow euvolemic window. Weight seems to be 163-166 now at home. - Will continue lasix 60 bid with 20 meq K daily for now. Will need to take metolazone 2.5 with 20 meq K for weight 168 or greater. Will get HH to draw BMET next week. - Reinforced daily weights, limiting fluids and making low salt food choices.  2. Pulmonary Hypertension - PA peak pressure 68 mm Hg last echo. RHC PA 65/30 (43). Had [place] 03/2015 with PVR 6. Suspect mostly 2ndary [place]. Will continue to follow. Will not start pulmonary vasodilator at this point.  3. COPD - On chronic 2 liters 02 4. Paroxysmal atrial fibrillation, on Eliquis 5 mg twice daily. Rate controlled on metoprolol and cardizem. Will cut back to 2.5 bid.  5. H/o CAD s/p stenting of  LAD 2008. 6. CKD stage IV - stable. Check BMET today 7. OSA- currently using CPAP nasally, may need to get a mask.   BMET next week with HH and follow up in 2 weeks.  Shirley Friar, PA-C 2:03 PM  Patient seen and examined with Oda Kilts, PA-C. We discussed all aspects of the encounter. I agree with the assessment and plan as stated above.   Volume  status mildly elevated. Has very tight euvolemic window. Agree with changes as above. Decrease apixaban to 2.5 bid. Continue to follow closely.   Bensimhon, Daniel,MD 7:54 PM

## 2015-07-05 NOTE — Patient Instructions (Signed)
Metolazone 2.5 mg as needed for weight greater than 168 pounds  Labs with home health next week   Follow up in 2 weeks

## 2015-07-06 ENCOUNTER — Other Ambulatory Visit: Payer: Self-pay

## 2015-07-06 DIAGNOSIS — I5033 Acute on chronic diastolic (congestive) heart failure: Secondary | ICD-10-CM

## 2015-07-06 NOTE — Patient Outreach (Signed)
[place] [facility]) Care Management  07/06/2015  [name] 1933-12-02 [id number]   Telephone Screen  Referral Date: 07/04/15 Referral Source: Aug 2016 HF readmission report Referral Reason: 3 admits in past 6 months   Outreach attempt #1 to patient. Patient reached and screening completed.  Social: Patient resides in her home with her spouse who is her main caregiver. She is independent with ADLs/IADLs. She reports last fall was around August of this year where she sustained a concussion. DME in the home include oxygen, walker,cane and cbg meter.  Conditions: She has h/o COPD(oxygen dependent), A-fib, CHF, HTN,COPD and DM. Patient was recently discharged from the hospital on 06/29/15 for PNA, acute vs chronic RF and CHF. She states that she is getting [facility] services at this time. Wgt this am was 166.2lbs per patient report. Fasting blood sugar was 184. Patient reports that her blood sugars have been fluctuating a lot lately.   Medications: She denies any issues with affording meds as she uses [facility]. She is taking more than 10 meds at this time.  Appointments: Patient saw PCP(Dr. Addison Lank) on last week and saw Dr. Mahalia Longest (heart MD)on yesterday. She states that she is to have blood work drawn within the next week by [facility] RN.  Consent: Patient gave verbal consent for [facility] services.  Plan: RN CM will notify [facility] administrative assistant that patient agreed to services and case opened. RN CM will send [facility] community referral for transition of care case. RN CM will send [facility] pharmacy referral for polypharmacy med review. RN CM provided patient with [facility] contact info. RN CM confirmed that patient is aware of when to seek medical care for changes in her condition.  Enzo Montgomery, RN,BSN,CCM [place] [facility] Telephonic Care Management Coordinator Direct Phone: [phone] Toll Free: [phone] Fax: [phone]

## 2015-07-08 ENCOUNTER — Other Ambulatory Visit: Payer: Self-pay | Admitting: *Deleted

## 2015-07-08 NOTE — Patient Outreach (Signed)
[facility]) Care Management  07/08/2015  [name] [facility] September 28, 1933 [id number]  Initial Transition of care (week 1)  RN spoke with primary caregiver (spouse) and pt and introduced the [facility] program and services. Pt states she is doing "fine" and currently has [place] involved with her care. RN explained the [facility] does not interfere with the current services being provided by [place]. RN explained the difference between [facility] and [facility] case management services related to her medical issues. Pt verbalized an understanding and the transition of care template as been completed. RN verified pt is weighing daily and verified today weight 166 lbs and yesterday's weight at 167 lbs with no distressful breathing and no swelling to her hands, legs/ankles or trunk area. States she is breathing fine and confirms she received the HF packet upon her discharge however her admission was due to dehydration no fluid retention.Verified pt remains in the GREEN zone after further discussion on the HF zones and requested pt ot review her packet for increase knowledge on her medical condition. RN extended community home visits that will not interfere with the current services of [place] however pt opt to decline [facility] the community home visits indicating she is doing well and does not feel she needs this service at this time. RN strongly encouraged pt to reconsider due to her medical history with COPD/CHF recent admissions however pt declined. RN requested to follow up with pt weekly over the next few weeks for ongoing transition of care calls as pt continue to recovery. Pt receptive and reports her initial primary care visit was last Friday. RN offered to call pt next week to inquire further as she continues to recovery (pt again receptive to only transition of care calls).  Raina Mina, RN Care Management Coordinator [place] [facility] Main Office [phone]

## 2015-07-11 ENCOUNTER — Telehealth (HOSPITAL_COMMUNITY): Payer: Self-pay

## 2015-07-11 NOTE — Telephone Encounter (Signed)
Varney Biles from [facility] given verbal order from Dr. Haroldine Laws to do a BMET this week

## 2015-07-12 ENCOUNTER — Other Ambulatory Visit: Payer: Self-pay | Admitting: *Deleted

## 2015-07-12 NOTE — Patient Outreach (Signed)
[facility] [facility]) Care Management  07/12/2015  [name] [facility] 08-09-1934 [id number]   Transition of care (week 2)   RN spoke with pt today who indicates she continues to do well with no major events or issues mentioned today. Pt continued to report [facility]'s involvement and states they will be visiting today for lab-work for Dr. Missy Sabins. Reports her weight today and yesterday at 164.2 lbs however issues over the weekend where she gained 4 lbs and had permission from her cardiologist to take additional fluid medication (Metolazone). Pt states her fluid retention has resolved and she is in the GREEN zone today.  Pt verified she continues to take all her medications as prescribed and attend all medical appointments as scheduled with no delays.  RN extended the offer once again to provide community home visits to better address her needs in managing her care however appreciative pt opt to decline but again remains receptive to the ongoing telephonic follow ups (transition of care contacts). RN praise pt for managing her care so carefully and responding early to her provider for interventions related to her swelling (HF).  RN offered to follow up next week as pt again receptive to the call. Will schedule the next transition of care call next Monday 12/5. No other inquires or request at this time.  Raina Mina, RN Care Management Coordinator [place] [facility] Main Office [phone]

## 2015-07-15 ENCOUNTER — Other Ambulatory Visit: Payer: Self-pay | Admitting: Pharmacist

## 2015-07-15 NOTE — Patient Outreach (Signed)
[facility] [facility]) Care Management  [place]   07/15/2015  Lorea Brester Alcindor 1934-04-27 [id number]  Subjective: [name] is a 81 y.o. female who was referred to [place] for a medication review due to polypharmacy.  Objective:   Current Medications: Current Outpatient Prescriptions  Medication Sig Dispense Refill  . acetaminophen (TYLENOL) 500 MG tablet Take 1,000 mg by mouth every 6 (six) hours as needed.    Marland Kitchen albuterol (PROVENTIL HFA;VENTOLIN HFA) 108 (90 BASE) MCG/ACT inhaler Inhale 1 puff into the lungs every 4 (four) hours as needed for wheezing or shortness of breath.    Marland Kitchen albuterol (PROVENTIL) (2.5 MG/3ML) 0.083% nebulizer solution Take 3 mLs (2.5 mg total) by nebulization every 2 (two) hours as needed for wheezing or shortness of breath. 75 mL 3  . allopurinol (ZYLOPRIM) 100 MG tablet Take 1 tablet (100 mg total) by mouth daily.    Marland Kitchen amLODipine (NORVASC) 2.5 MG tablet Take 1 tablet (2.5 mg total) by mouth daily. 90 tablet 3  . apixaban (ELIQUIS) 2.5 MG TABS tablet Take 2.5 mg by mouth 2 (two) times daily.    Marland Kitchen azelastine (ASTELIN) 137 MCG/SPRAY nasal spray 1-2 sprays each nostril once or twice daily 90 mL 3  . bisacodyl (DULCOLAX) 5 MG EC tablet Take 5 mg by mouth daily as needed for moderate constipation.    . budesonide (PULMICORT) 0.25 MG/2ML nebulizer solution Take 2 mLs (0.25 mg total) by nebulization daily. 60 mL prn  . calcitRIOL (ROCALTROL) 0.25 MCG capsule Take 0.25 mcg by mouth daily.     . colchicine 0.6 MG tablet Take 1.2 mg by mouth daily as needed (for gout flareups).     . CRESTOR 20 MG tablet Take 20 mg by mouth daily.     Marland Kitchen diltiazem (CARDIZEM CD) 120 MG 24 hr capsule Take 1 capsule (120 mg total) by mouth daily. 5 capsule 0  . furosemide (LASIX) 20 MG tablet Take 3 tablets (60 mg total) by mouth 2 (two) times daily. 180 tablet 3  . guaiFENesin (MUCINEX) 600 MG 12 hr tablet Take 600 mg by mouth daily.    . insulin  glargine (LANTUS) 100 UNIT/ML injection Inject 20-35 Units into the skin 2 (two) times daily. Inject 20 units in the morning and 35 units in the evening    . insulin lispro (HUMALOG) 100 UNIT/ML injection Inject 2 Units into the skin 2 (two) times daily with breakfast and lunch. Per sliding scale. Patient unsure about how much she really takes    . isosorbide mononitrate (IMDUR) 30 MG 24 hr tablet Take 30 mg by mouth daily.    Marland Kitchen levothyroxine (SYNTHROID, LEVOTHROID) 25 MCG tablet Take 25 mcg by mouth See admin instructions. Take 1 tablet (25 mcg) on MON, WED, FRI, SUN    . levothyroxine (SYNTHROID, LEVOTHROID) 50 MCG tablet Take 50 mcg by mouth See admin instructions. Take 50mg  on Tues, Thurs, Sat    . methocarbamol (ROBAXIN) 500 MG tablet Take 500 mg by mouth at bedtime.    . metolazone (ZAROXOLYN) 2.5 MG tablet Take 1 tablet (2.5 mg total) by mouth as needed. 15 tablet 3  . metoprolol (LOPRESSOR) 50 MG tablet Take 50 mg by mouth 2 (two) times daily.    . nitroGLYCERIN (NITROSTAT) 0.4 MG SL tablet Place 0.4 mg under the tongue every 5 (five) minutes as needed for chest pain.     Marland Kitchen oxybutynin (DITROPAN XL) 10 MG 24 hr tablet Take 10 mg by  mouth daily.     . potassium chloride SA (KLOR-CON M20) 20 MEQ tablet Take 1 tablet (20 mEq total) by mouth daily. Take an extra tab when Metolazone 90 tablet 3  . traMADol (ULTRAM) 50 MG tablet Take 50 mg by mouth every 6 (six) hours as needed for moderate pain.      No current facility-administered medications for this visit.    Functional Status: In your present state of health, do you have any difficulty performing the following activities: 06/28/2015 04/21/2015  Hearing? N -  Vision? N -  Difficulty concentrating or making decisions? N -  Walking or climbing stairs? Y -  Dressing or bathing? N -  Doing errands, shopping? Y -  Conservation officer, nature and eating ? - N  Using the Toilet? - N  In the past six months, have you accidently leaked urine? - N  Do you  have problems with loss of bowel control? - N  Managing your Medications? - N  Managing your Finances? - N  Housekeeping or managing your Housekeeping? - N    Fall/Depression Screening: PHQ 2/9 Scores 07/06/2015 04/21/2015  PHQ - 2 Score 0 0    Assessment: Drugs sorted by system:  Neurologic/Psychologic: none   Cardiovascular: amlodipine, apixaban, rosuvastatin, diltiazem, furosemide, isosorbide mononitrate, metolazone, metoprolol tartrate, nitroglycerin  Pulmonary/Allergy: albuterol, azelastine, budesonide, guaifenesin  Gastrointestinal: bisacodyl  Endocrine: Lantus, Humalog, levothyroxine  Renal: calcitriol, oxybutynin  Infectious Diseases:  Topical: none   Pain: acetaminophen, methocarbamol, tramadol  Vitamins/Minerals: potassium chloride  Miscellaneous: allopurinol, colchicine  Findings:  Duplications in therapy: none  Gaps in therapy: no aspirin as patient is on apixaban, on beta blocker, statin. No ACEi/ARB due to renal artery stenosis.  Medications to avoid in the elderly: none  Drug interactions: none noted  Inappropriate dose: maximum dose for Crestor is 10 mg when CrCl <30 ml/min (I have calculated her CrCl to be 26 ml/min based on most recent vitals and labs).  Other issues noted: none   Plan: 1. Medication review: only issue noted was Crestor dose. Will send a fax to primary care doctor's office to recommend that they consider decreasing the Crestor dose to 10 mg daily. This is not an urgent matter as patient is borderline. No other issues noted and no further recommendations. Will close pharmacy case.   Nicoletta Ba, PharmD, [place] [facility] [phone]

## 2015-07-18 ENCOUNTER — Other Ambulatory Visit: Payer: Self-pay | Admitting: *Deleted

## 2015-07-18 NOTE — Progress Notes (Signed)
Patient ID: [name], female   DOB: 07/29/1934, 81 y.o.   MRN: [id number] Patient ID: [name], female   DOB: [birth year], 81 y.o.   MRN: [id number]   Advanced Heart Failure Clinic Note   Patient ID: [name], female   DOB: 03/27/1934, 81 y.o.   MRN: [id number] PCP: Dr Annamaria Boots  Primary HF Cardiologist: Dr Vaughan Browner  HPI: [name] is a 81 y.o. female with history of chronic diastolic HF with RV dysfunction, OSA, COPD, pulmonary hypertension, chronic rate controlled A fib, type 2 diabetes.  She was admitted to [facility] on 03/16/15 when she presented with with worsening SOB, orthopnea, and PND. BNP 1109. CXR showing pulmonary congestion. Underwent 2d echo and right heart catheterization as below. Discharge weight 175, and diuretic regimen was decreased from Lasix 40 BID to 60 daily.  Admitted 03/16/15 with increased dyspnea. Diuresed with IV lasix and transitioned to lasix 40 mg daily. Has AKI noted on admit with creatinine up to 2.6. Creatinine down to 1.65 on the day of discharge. Discharge weight was 180 pounds.   Admitted 06/27/15 with AKI. Lasix had recently been increase and pt has narrow euvolemic window of 167-170. Was 164 at PCP. Labs at PCP showed Cr 1.8 -> 2.6 (06/23/15). Creatinine was 3.1 on arrival to ED with weight 163.8. Discharge weight was 161 lb, despite holding of diuretics and gentle IVF. Lasix decreased to 60 mg BID.   She returns today for follow up. Mild dyspnea with exertion but this is her baseline.  Denies PND/Orthopnea. Not very active. She is on 2 liters continuously. Wears CPAP at night. Weight at home 164-167 pounds. Taking all medications. No bleeding problems. No chest pain.    Labs 07/02/15 K 4.1, Creatinine 2.01 (1.97 on discharge 06/29/15)  [place] 03/21/15 RA = 14 RV = 60/10/14 PA = 65/30 (43) PCW = 20 Fick cardiac output/index = 3.8/2.1 PVR = 6.1 WU Arterial sat = 94% PA sat = 55%, 56%  Echo 03/17/15 EF 60-65%, RV severely  dilated with moderately reduced systolic function. PA peak pressure 68 mm Hg.  ROS: All systems negative except as listed in HPI, PMH and Problem List.  SH:  Social History   Social History  . Marital Status: Married    Spouse Name: N/A  . Number of Children: 2  . Years of Education: N/A   Occupational History  . retired     Social History Main Topics  . Smoking status: Former Smoker    Quit date: 08/13/1978  . Smokeless tobacco: Never Used  . Alcohol Use: No  . Drug Use: No  . Sexual Activity: No   Other Topics Concern  . Not on file   Social History Narrative   Lives with husband       FH:  Family History  Problem Relation Age of Onset  . Pneumonia Mother   . Heart disease Mother   . Allergies Mother   . Emphysema Mother   . Arthritis Mother   . Hypertension Mother   . Heart attack Father   . Hypertension Father   . Diabetes Sister   . Heart disease Sister   . Diabetes Brother   . Diabetes      grandfather  . Breast cancer      aunt  . Bone cancer      aunt  . Clotting disorder Brother   . Arthritis Sister   . Breast cancer Maternal Aunt     Past Medical  History  Diagnosis Date  . CAD (coronary artery disease)   . CHF (congestive heart failure) ([place])   . Paroxysmal atrial fibrillation (HCC)   . Bradycardia   . Hypertension   . Renal artery stenosis ([place])   . COPD (chronic obstructive pulmonary disease) ([place])   . Obesity hypoventilation syndrome ([place])   . Pruritus   . Tracheobronchitis   . Acute rhinosinusitis   . Anemia   . Hypoxemia   . Peripheral edema   . Obesity, endogenous   . Diabetes mellitus   . Acid reflux disease   . Osteoarthritis, generalized   . Hypothyroidism   . Lung nodule     lingula  . OSA (obstructive sleep apnea)     NPSG 04/03/00--AHI 28.hr  . DVT (deep venous thrombosis) ([place]) 2003    left leg  . Vaginal atrophy   . Hx of colonic polyps   . Acute diastolic heart failure ([place])   . Secondary pulmonary  hypertension ([place]) 06/09/2013  . Blood transfusion without reported diagnosis 2009    had internal bleeding  . Cancer ([place]) 1959    ovarian cancer  . Osteoporosis   . Stroke [facility])     Current Outpatient Prescriptions  Medication Sig Dispense Refill  . acetaminophen (TYLENOL) 500 MG tablet Take 1,000 mg by mouth every 6 (six) hours as needed for mild pain.     Marland Kitchen albuterol (PROVENTIL HFA;VENTOLIN HFA) 108 (90 BASE) MCG/ACT inhaler Inhale 1 puff into the lungs every 4 (four) hours as needed for wheezing or shortness of breath.    Marland Kitchen albuterol (PROVENTIL) (2.5 MG/3ML) 0.083% nebulizer solution Take 3 mLs (2.5 mg total) by nebulization every 2 (two) hours as needed for wheezing or shortness of breath. 75 mL 3  . allopurinol (ZYLOPRIM) 100 MG tablet Take 1 tablet (100 mg total) by mouth daily.    Marland Kitchen amLODipine (NORVASC) 2.5 MG tablet Take 1 tablet (2.5 mg total) by mouth daily. 90 tablet 3  . apixaban (ELIQUIS) 2.5 MG TABS tablet Take 2.5 mg by mouth 2 (two) times daily.    Marland Kitchen azelastine (ASTELIN) 137 MCG/SPRAY nasal spray 1-2 sprays each nostril once or twice daily 90 mL 3  . budesonide (PULMICORT) 0.25 MG/2ML nebulizer solution Take 2 mLs (0.25 mg total) by nebulization daily. 60 mL prn  . calcitRIOL (ROCALTROL) 0.25 MCG capsule Take 0.25 mcg by mouth daily.     . CRESTOR 20 MG tablet Take 20 mg by mouth daily.     Marland Kitchen diltiazem (CARDIZEM CD) 120 MG 24 hr capsule Take 1 capsule (120 mg total) by mouth daily. 5 capsule 0  . furosemide (LASIX) 20 MG tablet Take 3 tablets (60 mg total) by mouth 2 (two) times daily. 180 tablet 3  . guaiFENesin (MUCINEX) 600 MG 12 hr tablet Take 600 mg by mouth daily.    . insulin glargine (LANTUS) 100 UNIT/ML injection Inject 25-35 Units into the skin 2 (two) times daily. Inject 25 units in the morning and 35 units in the evening    . insulin lispro (HUMALOG) 100 UNIT/ML injection Inject 2 Units into the skin 2 (two) times daily with breakfast and lunch. Per sliding  scale. Patient unsure about how much she really takes    . isosorbide mononitrate (IMDUR) 30 MG 24 hr tablet Take 30 mg by mouth daily.    Marland Kitchen levothyroxine (SYNTHROID, LEVOTHROID) 25 MCG tablet Take 25 mcg by mouth See admin instructions. Take 1 tablet (25 mcg) on MON, WED, FRI,  SUN    . levothyroxine (SYNTHROID, LEVOTHROID) 50 MCG tablet Take 50 mcg by mouth See admin instructions. Take 50mg  on Tues, Thurs, Sat    . methocarbamol (ROBAXIN) 500 MG tablet Take 500 mg by mouth at bedtime.    . metoprolol (LOPRESSOR) 50 MG tablet Take 50 mg by mouth 2 (two) times daily.    Marland Kitchen oxybutynin (DITROPAN XL) 10 MG 24 hr tablet Take 10 mg by mouth daily.     . potassium chloride SA (KLOR-CON M20) 20 MEQ tablet Take 1 tablet (20 mEq total) by mouth daily. Take an extra tab when Metolazone 90 tablet 3  . traMADol (ULTRAM) 50 MG tablet Take 50 mg by mouth every 6 (six) hours as needed for moderate pain.     . bisacodyl (DULCOLAX) 5 MG EC tablet Take 5 mg by mouth daily as needed for moderate constipation.    . colchicine 0.6 MG tablet Take 1.2 mg by mouth daily as needed (for gout flareups).     . metolazone (ZAROXOLYN) 2.5 MG tablet Take 1 tablet (2.5 mg total) by mouth as needed. (Patient not taking: Reported on 07/19/2015) 15 tablet 3  . nitroGLYCERIN (NITROSTAT) 0.4 MG SL tablet Place 0.4 mg under the tongue every 5 (five) minutes as needed for chest pain.      No current facility-administered medications for this encounter.    Filed Vitals:   07/19/15 1423  BP: 116/62  Pulse: 91  Weight: 170 lb 9.6 oz (77.384 kg)  SpO2: 96%    PHYSICAL EXAM:  General:  Chronically ill appearing. No resp distress. HEENT: normal, On 2 liters [place].  Neck: supple. JVP ~10 cm. Carotids 2+ bilaterally; no bruits. No thyromegaly or nodule noted. Cor: PMI normal. RRR. No M/G/R appreciated Lungs: diminished bilateral bases.  Abdomen: obese, soft, NT, ND. No HSM. No bruits or masses. +BS Extremities: no cyanosis, clubbing,  rash. Trace edema. Marked varicose veins. Neuro: alert & orientedx3, cranial nerves grossly intact. Moves all 4 extremities w/o difficulty. Affect pleasant.  ASSESSMENT & PLAN: 1. Acute on chronic diastolic CHF/right heart failure : Echo 03/17/15 EF 60-65%, RV severely dilated with moderately reduced systolic function. - NYHA III-IIIb. Overall she is doing well.  Volume status mildly elevated. Today she will take an extra 20 mg lasix. Continue lasix 60 mg po twice.  - Has a narrow euvolemic window. Dry weight  seems to be 163-166 pound.  -. Will need to take metolazone 2.5 with 20 meq K for weight 168 or greater. - Reinforced daily weights, limiting fluids and making low salt food choices.  2. Pulmonary Hypertension - PA peak pressure 68 mm Hg last echo. RHC PA 65/30 (43). Had [place] 03/2015 with PVR 6. Suspect mostly 2ndary [place]. Will continue to follow. Will not start pulmonary vasodilator at this point. Continue home oxygen.  3. COPD - On chronic 2 liters 02 4. A fib Chronic-  Remains in A fib today. Continue eliquis 2.5 mg twice daily. Rate controlled on metoprolol and cardizem.  5. H/o CAD s/p stenting of LAD 2008.No evidence of ischemia.  6. CKD stage IV - Check BMET next visit.  7. OSA- currently using CPAP nasally . Follow up in 8 weeks with Dr Haroldine Laws  Darrick Grinder, NP-C  2:42 PM

## 2015-07-18 NOTE — Patient Outreach (Signed)
[facility]) Care Management  07/18/2015  Danyell Krolak [facility] 1934/04/17 [id number]   Transition of care (week 3)  RN a spoke with pt today and verified she remains in the GREEN with ongoing transition of care follow ups. Pt states she only had 2 lbs weight change in the last week and reported her weight at 166 both today and yesterday.  Denies any swelling with adherence to all prescribed medications and medical appointments.  RN discussed the option once again for community home visits to further address her needs however pt again opt to completed telephonic assessment during this transition of care and indicates she is recovery well and feels she is managing her care and does not need further assistance at this time. Pt receptive to the last transition of care call next Monday. Will schedule accordingly.  Raina Mina, RN Care Management Coordinator [facility] Main Office [phone]

## 2015-07-19 ENCOUNTER — Ambulatory Visit (HOSPITAL_COMMUNITY)
Admission: RE | Admit: 2015-07-19 | Discharge: 2015-07-19 | Disposition: A | Discharge status: Home / Self Care | Payer: Medicare Other | Source: Ambulatory Visit | Attending: Cardiology | Admitting: Cardiology

## 2015-07-19 VITALS — BP 116/62 | HR 91 | Wt 170.6 lb

## 2015-07-19 DIAGNOSIS — Z87891 Personal history of nicotine dependence: Secondary | ICD-10-CM | POA: Diagnosis not present

## 2015-07-19 DIAGNOSIS — Z794 Long term (current) use of insulin: Secondary | ICD-10-CM | POA: Diagnosis not present

## 2015-07-19 DIAGNOSIS — Z8543 Personal history of malignant neoplasm of ovary: Secondary | ICD-10-CM | POA: Diagnosis not present

## 2015-07-19 DIAGNOSIS — I482 Chronic atrial fibrillation, unspecified: Secondary | ICD-10-CM

## 2015-07-19 DIAGNOSIS — IMO0002 Reserved for concepts with insufficient information to code with codable children: Secondary | ICD-10-CM

## 2015-07-19 DIAGNOSIS — E662 Morbid (severe) obesity with alveolar hypoventilation: Secondary | ICD-10-CM | POA: Insufficient documentation

## 2015-07-19 DIAGNOSIS — K219 Gastro-esophageal reflux disease without esophagitis: Secondary | ICD-10-CM | POA: Insufficient documentation

## 2015-07-19 DIAGNOSIS — Z8673 Personal history of transient ischemic attack (TIA), and cerebral infarction without residual deficits: Secondary | ICD-10-CM | POA: Insufficient documentation

## 2015-07-19 DIAGNOSIS — N184 Chronic kidney disease, stage 4 (severe): Secondary | ICD-10-CM | POA: Diagnosis not present

## 2015-07-19 DIAGNOSIS — Z79899 Other long term (current) drug therapy: Secondary | ICD-10-CM | POA: Insufficient documentation

## 2015-07-19 DIAGNOSIS — I272 Other secondary pulmonary hypertension: Secondary | ICD-10-CM | POA: Diagnosis not present

## 2015-07-19 DIAGNOSIS — I4891 Unspecified atrial fibrillation: Secondary | ICD-10-CM

## 2015-07-19 DIAGNOSIS — I701 Atherosclerosis of renal artery: Secondary | ICD-10-CM | POA: Insufficient documentation

## 2015-07-19 DIAGNOSIS — R0989 Other specified symptoms and signs involving the circulatory and respiratory systems: Secondary | ICD-10-CM | POA: Diagnosis not present

## 2015-07-19 DIAGNOSIS — Z7901 Long term (current) use of anticoagulants: Secondary | ICD-10-CM | POA: Diagnosis not present

## 2015-07-19 DIAGNOSIS — I5033 Acute on chronic diastolic (congestive) heart failure: Secondary | ICD-10-CM | POA: Diagnosis present

## 2015-07-19 DIAGNOSIS — I480 Paroxysmal atrial fibrillation: Secondary | ICD-10-CM | POA: Insufficient documentation

## 2015-07-19 DIAGNOSIS — E039 Hypothyroidism, unspecified: Secondary | ICD-10-CM | POA: Diagnosis not present

## 2015-07-19 DIAGNOSIS — I5032 Chronic diastolic (congestive) heart failure: Secondary | ICD-10-CM

## 2015-07-19 DIAGNOSIS — N179 Acute kidney failure, unspecified: Secondary | ICD-10-CM | POA: Diagnosis not present

## 2015-07-19 DIAGNOSIS — I2510 Atherosclerotic heart disease of native coronary artery without angina pectoris: Secondary | ICD-10-CM | POA: Insufficient documentation

## 2015-07-19 DIAGNOSIS — J449 Chronic obstructive pulmonary disease, unspecified: Secondary | ICD-10-CM | POA: Diagnosis not present

## 2015-07-19 DIAGNOSIS — Z9981 Dependence on supplemental oxygen: Secondary | ICD-10-CM | POA: Insufficient documentation

## 2015-07-19 DIAGNOSIS — E119 Type 2 diabetes mellitus without complications: Secondary | ICD-10-CM | POA: Insufficient documentation

## 2015-07-19 DIAGNOSIS — I129 Hypertensive chronic kidney disease with stage 1 through stage 4 chronic kidney disease, or unspecified chronic kidney disease: Secondary | ICD-10-CM | POA: Insufficient documentation

## 2015-07-19 MED ORDER — ONDANSETRON HCL 4 MG PO TABS: 4.0000 mg | ORAL_TABLET | Freq: Three times a day (TID) | ORAL | Status: AC | PRN

## 2015-07-19 MED ORDER — FUROSEMIDE 20 MG PO TABS: 60.0000 mg | ORAL_TABLET | Freq: Two times a day (BID) | ORAL | Status: DC

## 2015-07-19 NOTE — Patient Instructions (Signed)
Take an additional dose of Lasix today  Your physician recommends that you schedule a follow-up appointment in: 8 weeks with Dr.Bensimhon  Do the following things EVERYDAY: 1) Weigh yourself in the morning before breakfast. Write it down and keep it in a log. 2) Take your medicines as prescribed 3) Eat low salt foods-Limit salt (sodium) to 2000 mg per day.  4) Stay as active as you can everyday 5) Limit all fluids for the day to less than 2 liters 6)

## 2015-07-19 NOTE — Progress Notes (Signed)
Advanced Heart Failure Medication Review by a Pharmacist  Does the patient  feel that his/her medications are working for him/her?  yes  Has the patient been experiencing any side effects to the medications prescribed?  no  Does the patient measure his/her own blood pressure or blood glucose at home?  yes   Does the patient have any problems obtaining medications due to transportation or finances?   no  Understanding of regimen: good Understanding of indications: good Potential of compliance: good Patient understands to avoid NSAIDs. Patient understands to avoid decongestants.  Issues to address at subsequent visits: None   Pharmacist comments:  [name] is a pleasant 81 yo F presenting without a medication list but with good recall of her regimen including most dosages. She reports excellent compliance with her medications and states that she is able to get her medications from the [place] at a reasonable cost. She did not have any specific medication-related questions or concerns for me at this time.   [name], PharmD, BCPS, CPP Clinical Pharmacist Pager: [phone] Phone: [phone] 07/19/2015 2:49 PM      Time with patient: 8 minutes Preparation and documentation time: 2 minutes Total time: 10 minutes

## 2015-07-25 ENCOUNTER — Encounter: Payer: Self-pay | Admitting: *Deleted

## 2015-07-25 ENCOUNTER — Other Ambulatory Visit: Payer: Self-pay | Admitting: *Deleted

## 2015-07-25 NOTE — Patient Outreach (Signed)
[facility]) Care Management  07/25/2015  [name] 12/04/1933 [id number]    Transition of Care ( week 4)  RN spoke with pt today and inquired on her ongoing management of care. Pt reports she remains in the GREEN zone with no encountered issues. Reports her weight over the last two days 163 lbs and no additional weight was gained over the last week. Denies any swelling or breathing issue and states recent visit to HF clinic was a good report with no follow up for 8 weeks. Continues to report [facility] involvement with no issues. RN extended the offer once again for case management home visits for further involvement however pt declined. RN also extended the offer for a health coach telephonically however pt declined this offer as well. Will verify no other issues or barriers to address at this time as pt aware of HF zones and what to do if acute symptoms should occur. Pt expressed how grateful she was for the telephone calls and expressed Holiday cheer as the call was ended. Will report to primary providers on pt's decline for further involvement and close this case accordingly.  Raina Mina, RN Care Management Coordinator [facility] Main Office [phone]

## 2015-07-26 ENCOUNTER — Other Ambulatory Visit (HOSPITAL_COMMUNITY): Payer: Self-pay | Admitting: Internal Medicine

## 2015-08-05 ENCOUNTER — Other Ambulatory Visit (HOSPITAL_COMMUNITY): Payer: Self-pay | Admitting: *Deleted

## 2015-08-10 ENCOUNTER — Telehealth (HOSPITAL_COMMUNITY): Payer: Self-pay | Admitting: *Deleted

## 2015-08-10 NOTE — Telephone Encounter (Signed)
Donita, RN left a VM about pt's wt being up 3.2 lb since Fri, VS stable but she states pt does have crackles in bases, she also reports pt is having a lot of itching in vaginal area and is concerned could have a yeast infection.  Discussed w/MD ok for pt to take a metolazone x 1 dose, needs to contact pcp regarding vaginal itching.  Called and spoke w/pt, she states wt has been running around 159 lb at home but since Fri has been increasing and she is at 163 lb today, she states her breathing seems stable but she feels like she is starting to retain some fluid.  She is agreeable to take a metolazone and states she will take it in the AM as it is so late in the day today, if wt not improving she will let us know.  Also advised pt to f/u with pcp regarding vaginal itching, she states she has spoke w/them and they are uncomfortable addressing with all her other medications and issues, advised ok for to use a cream and one time dose of Diflucan is ok as well but we can not prescribe as we are cardiology office, she ask this info be sent to pcp and she will f/u with them, advised would fax this note.

## 2015-08-26 ENCOUNTER — Encounter: Payer: Self-pay | Admitting: Internal Medicine

## 2015-08-26 ENCOUNTER — Ambulatory Visit (INDEPENDENT_AMBULATORY_CARE_PROVIDER_SITE_OTHER): Payer: Medicare Other | Admitting: Internal Medicine

## 2015-08-26 VITALS — BP 112/62 | HR 96 | Ht 60.0 in | Wt 165.2 lb

## 2015-08-26 DIAGNOSIS — J9611 Chronic respiratory failure with hypoxia: Secondary | ICD-10-CM | POA: Diagnosis not present

## 2015-08-26 DIAGNOSIS — G4733 Obstructive sleep apnea (adult) (pediatric): Secondary | ICD-10-CM | POA: Diagnosis not present

## 2015-08-26 DIAGNOSIS — E669 Obesity, unspecified: Secondary | ICD-10-CM

## 2015-08-26 NOTE — Assessment & Plan Note (Signed)
She has been able to lose a very significant amount of weight in recent years partly reflecting multiple comorbidities

## 2015-08-26 NOTE — Assessment & Plan Note (Signed)
Continues to depend on supplemental oxygen 2 L/[facility] and has been compliant with this therapy.

## 2015-08-26 NOTE — Patient Instructions (Signed)
Order- DME [facility]- renewal of CPAP order, 7 cwp, mask of choice, humidifier, supplies, please add AirView, send download       Dx OSA

## 2015-08-26 NOTE — Assessment & Plan Note (Signed)
Now compliantly using CPAP 7 and oxygen 2 L, trying to protect her heart. Plan-we need to establish Airview for documentation. She will talk with DME company about mask options. [employer] form completed

## 2015-08-26 NOTE — Progress Notes (Signed)
Patient ID: [name], female    DOB: 09-06-1933, 82 y.o.   MRN: [id number]   04/23/13-  89 yo former smoker followed for OSA, OHS, occasional tracheobronchitis.Complicated by CAD/AFib, morbid obesity CPAP 16/O2 2 L./ [facility] Weight has been stable around the 192 pounds this year. She is comfortable with her CPAP plus oxygen and is sleeping pretty well. Using a rolling walker. Recently treated for heart failure. Her PCP did CXR/ [facility],.  11/05/13-79 yo former smoker followed for OSA, OHS, occasional tracheobronchitis, COPD/respiratory failure .Complicated by CAD/AFib/ CHF, morbid obesity, GERD, DM CPAP 16/ - quit.    O2 2 L./ [facility] FOLLOWS FOR:  Not wearing CPAP since October-- Reports breathing doing well. Weight down from 215 in 2010 to 163 lbs now Hospitalized in October with pneumonia, then 3 times for congestive heart failure. Continues oxygen 2 L Using Astelin nasal spray without need for rescue inhaler or active bronchodilators. Doing nebulized budesonide once daily  Acute- Dr Gwenette Greet HPI Patient comes in today for an acute sick visit. She is normally followed by Dr. Annamaria Boots for chronic respiratory failure, as well as obesity hypoventilation syndrome. It is unclear if she has underlying obstructive lung disease. She gives a 2 day history of increasing chest congestion, cough with purulent mucus, and some increased shortness of breath. She has had subjective fevers, and a neighbor who is a nurse has told her that her lungs sound abnormal.   05/13/14- 48 yo former smoker followed for OSA, OHS, occasional tracheobronchitis, COPD/respiratory failure .Complicated by CAD/AFib/ CHF, morbid obesity, GERD, DM CPAP 16/ - quit.    O2 2 L./ [facility] FOLLOWS FOR:Has trouble sleeping at times,sob occas.,cough-yellow and white,wheezing,denies cp or tightness,pnd,stuffy nose in am,not using CPAP   11/15/14- 21 yo former smoker followed for OSA/ /quit CPAP, OHS, occasional tracheobronchitis,  COPD/respiratory failure .Complicated by CAD/AFib/ CHF, morbid obesity, GERD, DM CPAP 16/ - quit.    O2 2 L./ [facility] FOLLOWS FOR: . Pt reports having some breathing issues with the pollen - SOB.  Acute illness last week- malaise, fever, n&V, diarrhea, head congestion, clear mucus. Improving.  02/10/15- 33 yo former smoker followed for OSA/ /quit CPAP, OHS, occasional tracheobronchitis, COPD/respiratory failure .Complicated by CAD/AFib/ CHF, morbid obesity, GERD, DM CPAP 16/ - quit.    O2 3 L./ [facility] FOLLOWS FOR Pt c/o productive cough with yellow green mucus, increased SOB, chest tightness, wheezing, and bilateral lower ext edema x 3 weeks. Pt states finished Augmentin 11 days ago.   05/19/15- 89 yo former smoker followed for OSA/ /quit CPAP, OHS, occasional tracheobronchitis, COPD/respiratory failure .Complicated by CAD/AFib/ CHF, morbid obesity, GERD, DM O2 2 L/[facility] FOLLOWS FOR Pt states that breathing is better since last visit. Pt denies cough, SOB, wheezing, chest tightness, chest pain or edema. Pt states recent hospiitalization 03/2015 and spent 15 days inpatient.  Hospitalized in August with acute on chronic renal failure, diastolic CHF, atrial fibrillation. Has had flu shot. She had stopped CPAP when she lost weight and now just uses oxygen 24/72 L/[facility]. During hospital stay she was told she should restart CPAP because of her heart. She asks about trying BiPAP because she didn't tolerate CPAP well and a friend told her "BiPAP was better". Educational discussion done. CXR 03/28/15 RESSION: No active cardiopulmonary disease. Stable moderate compression fracture over the lower thoracic spine with stable loss of height of several mid to upper thoracic vertebral bodies. Electronically Signed  By: Marin Olp M.D.  On: 03/28/2015 19:33  08/26/2015-82 year old female former smoker  followed for OSA, OHS, occasional tracheobronchitis, COPD/respiratory failure complicated by  CAD/A. fib/CHF, obesity, GERD, DM FOLLOWS FOR: SOB worse on exertion. prod cough with yellow mucus. wheeing earlier this week. cp/tightness earlier this week better today. Stopped CPAP for awhile, started using CPAP again Nov 2016 problems using equipment pt states that she is a mouth breather. Needs to make appt to get a new mask.  CXR 06/27/2015-mild bibasilar atelectasis otherwise clear CPAP 7/ [facility] since CPAP titration study October 10-also required supplemental oxygen 2 L during that study She is much more consistent with CPAP now that she had been in the past. Uncomfortable with nasal pillows and she is going to talk with her DME company about change to a fullface mask. She indicates she is comfortable and sleeping better, reporting good compliance. We need download documentation.  Review of Systems-see HPI Constitutional:   + Long-term weight loss, no-night sweats, fevers, chills, fatigue, lassitude. HEENT:   No-  headaches, difficulty swallowing, tooth/dental problems, sore throat,       No-  sneezing, itching, ear ache, +nasal congestion, post nasal drip,  CV:  No-  anginal chest pain, orthopnea, PND, swelling in lower extremities, anasarca, dizziness, palpitations Resp: +  shortness of breath with exertion or at rest.            productive cough,  No non-productive cough,  No- coughing up of blood.                change in color of mucus.  No- wheezing.   Skin: no-rash GI:  +HPI GU:  MS:  No-   joint pain or swelling.   Neuro-     nothing unusual Psych:  No- change in mood or affect. No depression or anxiety.  No memory loss.   Objective:   Physical Exam General- Alert, Oriented, Affect-appropriate, Distress- none acute, O2 2L/M, using a cane Skin- rash-none, lesions- none, excoriation- none Lymphadenopathy- none Head- atraumatic            Eyes- Gross vision intact, PERRLA, conjunctivae clear secretions, not injected            Ears- Hearing, canals-normal            Nose-  +stuffy, no-Septal dev, mucus, polyps, erosion, perforation             Throat- Mallampati III-IV , mucosa clear , drainage- none, tonsils- atrophic Neck- flexible , trachea midline, no stridor , thyroid nl, carotid no bruit Chest - symmetrical excursion , unlabored           Heart/CV- IRR/A. fib , no murmur , no gallop  , no rub, nl s1 s2                           - JVD- none , edema + trace edema, stasis changes- none, varices- none           Lung-, wheeze- none, cough , dullness-none, rub- none           Chest wall-  Abd-  Br/ Gen/ Rectal- Not done, not indicated Extrem- cyanosis- none, clubbing, none, atrophy- none, strength- nl, + dusky feet Neuro- grossly intact to observation

## 2015-08-30 ENCOUNTER — Telehealth: Payer: Self-pay | Admitting: Internal Medicine

## 2015-08-30 DIAGNOSIS — G4733 Obstructive sleep apnea (adult) (pediatric): Secondary | ICD-10-CM

## 2015-08-30 NOTE — Telephone Encounter (Signed)
Spoke with melissa at [facility], states that pt neesd an order for a cpap mask refit.  The previous order was for supplies but not specifically for a mask refit.  This has been sent.  Nothing further needed.

## 2015-09-12 ENCOUNTER — Telehealth: Payer: Self-pay | Admitting: Internal Medicine

## 2015-09-12 NOTE — Telephone Encounter (Signed)
Pt calling requesting a refill on Imdur. Pt states that she will pick up paper script to take to her [place]. Please advise

## 2015-09-19 ENCOUNTER — Ambulatory Visit (HOSPITAL_COMMUNITY)
Admission: RE | Admit: 2015-09-19 | Discharge: 2015-09-19 | Disposition: A | Discharge status: Home / Self Care | Payer: Medicare Other | Source: Ambulatory Visit | Attending: Cardiology | Admitting: Cardiology

## 2015-09-19 VITALS — BP 114/62 | HR 59 | Wt 156.2 lb

## 2015-09-19 DIAGNOSIS — J9611 Chronic respiratory failure with hypoxia: Secondary | ICD-10-CM | POA: Diagnosis not present

## 2015-09-19 DIAGNOSIS — I5189 Other ill-defined heart diseases: Secondary | ICD-10-CM | POA: Diagnosis not present

## 2015-09-19 DIAGNOSIS — I2510 Atherosclerotic heart disease of native coronary artery without angina pectoris: Secondary | ICD-10-CM | POA: Insufficient documentation

## 2015-09-19 DIAGNOSIS — I272 Other secondary pulmonary hypertension: Secondary | ICD-10-CM | POA: Insufficient documentation

## 2015-09-19 DIAGNOSIS — IMO0002 Reserved for concepts with insufficient information to code with codable children: Secondary | ICD-10-CM

## 2015-09-19 DIAGNOSIS — Z7901 Long term (current) use of anticoagulants: Secondary | ICD-10-CM | POA: Insufficient documentation

## 2015-09-19 DIAGNOSIS — J449 Chronic obstructive pulmonary disease, unspecified: Secondary | ICD-10-CM | POA: Diagnosis not present

## 2015-09-19 DIAGNOSIS — E662 Morbid (severe) obesity with alveolar hypoventilation: Secondary | ICD-10-CM | POA: Insufficient documentation

## 2015-09-19 DIAGNOSIS — I519 Heart disease, unspecified: Secondary | ICD-10-CM

## 2015-09-19 DIAGNOSIS — I129 Hypertensive chronic kidney disease with stage 1 through stage 4 chronic kidney disease, or unspecified chronic kidney disease: Secondary | ICD-10-CM | POA: Diagnosis not present

## 2015-09-19 DIAGNOSIS — Z794 Long term (current) use of insulin: Secondary | ICD-10-CM | POA: Diagnosis not present

## 2015-09-19 DIAGNOSIS — Z9981 Dependence on supplemental oxygen: Secondary | ICD-10-CM | POA: Insufficient documentation

## 2015-09-19 DIAGNOSIS — I5032 Chronic diastolic (congestive) heart failure: Secondary | ICD-10-CM | POA: Diagnosis not present

## 2015-09-19 DIAGNOSIS — Z87891 Personal history of nicotine dependence: Secondary | ICD-10-CM | POA: Diagnosis not present

## 2015-09-19 DIAGNOSIS — N184 Chronic kidney disease, stage 4 (severe): Secondary | ICD-10-CM | POA: Diagnosis not present

## 2015-09-19 DIAGNOSIS — I482 Chronic atrial fibrillation: Secondary | ICD-10-CM | POA: Diagnosis not present

## 2015-09-19 DIAGNOSIS — Z79899 Other long term (current) drug therapy: Secondary | ICD-10-CM | POA: Diagnosis not present

## 2015-09-19 DIAGNOSIS — E1122 Type 2 diabetes mellitus with diabetic chronic kidney disease: Secondary | ICD-10-CM | POA: Diagnosis not present

## 2015-09-19 LAB — BASIC METABOLIC PANEL
ANION GAP: 10 (ref 5–15)
BUN: 52 mg/dL — ABNORMAL HIGH (ref 6–20)
CO2: 32 mmol/L (ref 22–32)
Calcium: 11.2 mg/dL — ABNORMAL HIGH (ref 8.9–10.3)
Chloride: 96 mmol/L — ABNORMAL LOW (ref 101–111)
Creatinine, Ser: 2.32 mg/dL — ABNORMAL HIGH (ref 0.44–1.00)
GFR calc non Af Amer: 19 mL/min — ABNORMAL LOW (ref >60)
GFR, EST AFRICAN AMERICAN: 22 mL/min — AB (ref >60)
Glucose, Bld: 141 mg/dL — ABNORMAL HIGH (ref 65–99)
Potassium: 3.4 mmol/L — ABNORMAL LOW (ref 3.5–5.1)
Sodium: 138 mmol/L (ref 135–145)

## 2015-09-19 LAB — CBC
HEMATOCRIT: 42.3 % (ref 36.0–46.0)
Hemoglobin: 13.7 g/dL (ref 12.0–15.0)
MCH: 31.6 pg (ref 26.0–34.0)
MCHC: 32.4 g/dL (ref 30.0–36.0)
MCV: 97.7 fL (ref 78.0–100.0)
Platelets: 163 10*3/uL (ref 150–400)
RBC: 4.33 MIL/uL (ref 3.87–5.11)
RDW: 15.8 % — ABNORMAL HIGH (ref 11.5–15.5)
WBC: 8 10*3/uL (ref 4.0–10.5)

## 2015-09-19 NOTE — Progress Notes (Signed)
Advanced Heart Failure Medication Review by a Pharmacist  Does the patient  feel that his/her medications are working for him/her?  yes  Has the patient been experiencing any side effects to the medications prescribed?  no  Does the patient measure his/her own blood pressure or blood glucose at home?  yes   Does the patient have any problems obtaining medications due to transportation or finances?   no  Understanding of regimen: good Understanding of indications: good Potential of compliance: good Patient understands to avoid NSAIDs. Patient understands to avoid decongestants.  Issues to address at subsequent visits: None   Pharmacist comments:  [name] is a pleasant 82 yo F presenting without a medication list but with fair recall of her regimen. She reports good compliance with her regimen although she does state that she only takes potassium if she takes metolazone. She did not have any other medication-related questions or concerns for me at this time.   [name]. [name], PharmD, BCPS, CPP Clinical Pharmacist Pager: [phone] Phone: [phone] 09/19/2015 2:12 PM      Time with patient: 14 minutes Preparation and documentation time: 2 minutes Total time: 16 minutes

## 2015-09-19 NOTE — Progress Notes (Signed)
Advanced Heart Failure Clinic Note   Patient ID: [name], female   DOB: 1934/04/06, 82 y.o.   MRN: [id number] PCP: Dr Addison Lank  Primary HF Cardiologist: Dr Vaughan Browner  HPI: [name] is a 82 y.o. female with history of chronic diastolic HF with RV dysfunction, OSA, COPD, pulmonary hypertension, chronic rate controlled A fib, type 2 diabetes.  She was admitted to [facility] on 03/16/15 when she presented with with worsening SOB, orthopnea, and PND. BNP 1109. CXR showing pulmonary congestion. Underwent 2d echo and right heart catheterization as below. Discharge weight 175, and diuretic regimen was decreased from Lasix 40 BID to 60 daily.  Admitted 03/16/15 with increased dyspnea. Diuresed with IV lasix and transitioned to lasix 40 mg daily. Has AKI noted on admit with creatinine up to 2.6. Creatinine down to 1.65 on the day of discharge. Discharge weight was 180 pounds.   Admitted 06/27/15 with AKI. Lasix had recently been increase and pt has narrow euvolemic window of 167-170. Was 164 at PCP. Labs at PCP showed Cr 1.8 -> 2.6 (06/23/15). Creatinine was 3.1 on arrival to ED with weight 163.8. Discharge weight was 161 lb, despite holding of diuretics and gentle IVF. Lasix decreased to 60 mg BID.   She returns for followup today.  Generally stable. She is on 2 liters continuously. Wears CPAP at night. Taking all medications. No bleeding problems. No chest pain.  Weight is down 14 lbs.  She has occasional coughing with right-sided pleuritic chest pain, but this is not frequent.  No dyspnea walking on flat ground. Not very active though.  Uses her walker.  Using metolazone about every 2 wks.   Labs 11/16 K 4.1, Creatinine 2.01=> 1.89  RHC 03/21/15 RA = 14 RV = 60/10/14 PA = 65/30 (43) PCW = 20 Fick cardiac output/index = 3.8/2.1 PVR = 6.1 WU Arterial sat = 94% PA sat = 55%, 56%  Echo 03/17/15 EF 60-65%, RV severely dilated with moderately reduced systolic function. PA peak pressure 68  mm Hg.  ROS: All systems negative except as listed in HPI, PMH and Problem List.  SH:  Social History   Social History  . Marital Status: Married    Spouse Name: N/A  . Number of Children: 2  . Years of Education: N/A   Occupational History  . retired     Social History Main Topics  . Smoking status: Former Smoker    Quit date: 08/13/1978  . Smokeless tobacco: Never Used  . Alcohol Use: No  . Drug Use: No  . Sexual Activity: No   Other Topics Concern  . Not on file   Social History Narrative   Lives with husband       FH:  Family History  Problem Relation Age of Onset  . Pneumonia Mother   . Heart disease Mother   . Allergies Mother   . Emphysema Mother   . Arthritis Mother   . Hypertension Mother   . Heart attack Father   . Hypertension Father   . Diabetes Sister   . Heart disease Sister   . Diabetes Brother   . Diabetes      grandfather  . Breast cancer      aunt  . Bone cancer      aunt  . Clotting disorder Brother   . Arthritis Sister   . Breast cancer Maternal Aunt     Past Medical History  Diagnosis Date  . CAD (coronary artery disease)   .  CHF (congestive heart failure) ([place])   . Paroxysmal atrial fibrillation (HCC)   . Bradycardia   . Hypertension   . Renal artery stenosis ([place])   . COPD (chronic obstructive pulmonary disease) ([place])   . Obesity hypoventilation syndrome ([place])   . Pruritus   . Tracheobronchitis   . Acute rhinosinusitis   . Anemia   . Hypoxemia   . Peripheral edema   . Obesity, endogenous   . Diabetes mellitus   . Acid reflux disease   . Osteoarthritis, generalized   . Hypothyroidism   . Lung nodule     lingula  . OSA (obstructive sleep apnea)     NPSG 04/03/00--AHI 28.hr  . DVT (deep venous thrombosis) ([place]) 2003    left leg  . Vaginal atrophy   . Hx of colonic polyps   . Acute diastolic heart failure ([place])   . Secondary pulmonary hypertension ([place]) 06/09/2013  . Blood transfusion without reported diagnosis  2009    had internal bleeding  . Cancer ([place]) 1959    ovarian cancer  . Osteoporosis   . Stroke [facility])     Current Outpatient Prescriptions  Medication Sig Dispense Refill  . acetaminophen (TYLENOL) 500 MG tablet Take 1,000 mg by mouth every 6 (six) hours as needed for mild pain.     Marland Kitchen albuterol (PROVENTIL HFA;VENTOLIN HFA) 108 (90 BASE) MCG/ACT inhaler Inhale 1 puff into the lungs every 4 (four) hours as needed for wheezing or shortness of breath.    Marland Kitchen albuterol (PROVENTIL) (2.5 MG/3ML) 0.083% nebulizer solution Take 3 mLs (2.5 mg total) by nebulization every 2 (two) hours as needed for wheezing or shortness of breath. 75 mL 3  . allopurinol (ZYLOPRIM) 100 MG tablet Take 1 tablet (100 mg total) by mouth daily.    Marland Kitchen amLODipine (NORVASC) 2.5 MG tablet Take 1 tablet (2.5 mg total) by mouth daily. 90 tablet 3  . apixaban (ELIQUIS) 2.5 MG TABS tablet Take 2.5 mg by mouth 2 (two) times daily.    Marland Kitchen azelastine (ASTELIN) 137 MCG/SPRAY nasal spray 1-2 sprays each nostril once or twice daily 90 mL 3  . bisacodyl (DULCOLAX) 5 MG EC tablet Take 5 mg by mouth daily as needed for moderate constipation.    . budesonide (PULMICORT) 0.25 MG/2ML nebulizer solution Take 2 mLs (0.25 mg total) by nebulization daily. 60 mL prn  . calcitRIOL (ROCALTROL) 0.25 MCG capsule Take 0.25 mcg by mouth daily.     . colchicine 0.6 MG tablet Take 1.2 mg by mouth daily as needed (for gout flareups).     . CRESTOR 20 MG tablet Take 20 mg by mouth daily.     Marland Kitchen diltiazem (CARDIZEM CD) 120 MG 24 hr capsule Take 1 capsule (120 mg total) by mouth daily. 5 capsule 0  . furosemide (LASIX) 20 MG tablet Take 3 tablets (60 mg total) by mouth 2 (two) times daily. 180 tablet 3  . guaiFENesin (MUCINEX) 600 MG 12 hr tablet Take 600 mg by mouth daily.    . insulin glargine (LANTUS) 100 UNIT/ML injection Inject 25-35 Units into the skin 2 (two) times daily. Inject 25 units in the morning and 35 units in the evening    . insulin lispro  (HUMALOG) 100 UNIT/ML injection Inject 2 Units into the skin 2 (two) times daily with breakfast and lunch. Per sliding scale. Patient unsure about how much she really takes    . isosorbide mononitrate (IMDUR) 30 MG 24 hr tablet Take 30 mg by mouth  daily.    . levothyroxine (SYNTHROID, LEVOTHROID) 25 MCG tablet Take 25 mcg by mouth See admin instructions. Take 1 tablet (25 mcg) on MON, WED, FRI, SUN    . levothyroxine (SYNTHROID, LEVOTHROID) 50 MCG tablet Take 50 mcg by mouth See admin instructions. Take 50mg  on Tues, Thurs, Sat    . methocarbamol (ROBAXIN) 500 MG tablet Take 500 mg by mouth at bedtime.    . nitroGLYCERIN (NITROSTAT) 0.4 MG SL tablet Place 0.4 mg under the tongue every 5 (five) minutes as needed for chest pain.     Marland Kitchen ondansetron (ZOFRAN) 4 MG tablet Take 1 tablet (4 mg total) by mouth every 8 (eight) hours as needed for nausea or vomiting. 30 tablet 0  . oxybutynin (DITROPAN XL) 10 MG 24 hr tablet Take 10 mg by mouth daily.     . traMADol (ULTRAM) 50 MG tablet Take 50 mg by mouth every 6 (six) hours as needed for moderate pain.     . metolazone (ZAROXOLYN) 2.5 MG tablet Take 1 tablet (2.5 mg total) by mouth as needed. (Patient not taking: Reported on 09/19/2015) 15 tablet 3  . metoprolol (LOPRESSOR) 50 MG tablet Take 50 mg by mouth 2 (two) times daily. Reported on 09/19/2015     No current facility-administered medications for this encounter.    Filed Vitals:   09/19/15 1350  BP: 114/62  Pulse: 59  Weight: 156 lb 4 oz (70.875 kg)  SpO2: 96%    PHYSICAL EXAM:  General:  Chronically ill appearing. No resp distress. HEENT: normal. On 2 liters [place].  Neck: supple. JVP ~ 7 cm. Carotids 2+ bilaterally; no bruits. No thyromegaly or nodule noted. Cor: PMI normal. Irregular rhythm. No M/G/R appreciated Lungs: diminished bilateral bases.  Abdomen: obese, soft, NT, ND. No HSM. No bruits or masses. +BS Extremities: no cyanosis, clubbing, rash. Trace ankle edema. Marked varicose  veins. Neuro: alert & orientedx3, cranial nerves grossly intact. Moves all 4 extremities w/o difficulty. Affect pleasant.  ASSESSMENT & PLAN: 1. Chronic diastolic CHF/right heart failure: Echo 03/17/15 EF 60-65%, RV severely dilated with moderately reduced systolic function.  NYHA class II-III symptoms.  Does not look volume overloaded and weight down 14 lbs compared to prior appointment.  - Continue Lasix 60 mg bid.  Check BMET today.  - Continue prn metolazone.  - Reinforced daily weights, limiting fluids and making low salt food choices.  2. Pulmonary Hypertension: PA peak pressure 68 mm Hg last echo. RHC PA 65/30 (43). Had [place] 03/2015 with PVR 6. Suspect mostly group 3 pulmonary hypertension due to chronic lung disease. Will continue to follow. Will not start pulmonary vasodilator at this point. Continue home oxygen and CPAP at night.  3. COPD: On chronic 2 liters 02 4. A fib: Chronic.  Remains in A fib today. Continue Eliquis 2.5 mg twice daily. Rate controlled on metoprolol and cardizem. CBC today.  5. H/o CAD s/p stenting of LAD 2008.  No evidence of ischemia.  6. CKD stage IV: Check BMET today.  7. OHS/OSA: currently using CPAP nightly and oxygen during the day.  [name] 09/19/2015

## 2015-09-19 NOTE — Patient Instructions (Signed)
Routine lab work today. Will notify you of abnormal results, otherwise no news is good news!  Follow up 3 months.  Do the following things EVERYDAY: 1) Weigh yourself in the morning before breakfast. Write it down and keep it in a log. 2) Take your medicines as prescribed 3) Eat low salt foods-Limit salt (sodium) to 2000 mg per day.  4) Stay as active as you can everyday 5) Limit all fluids for the day to less than 2 liters  

## 2015-09-22 ENCOUNTER — Other Ambulatory Visit (HOSPITAL_COMMUNITY): Payer: Self-pay | Admitting: *Deleted

## 2015-09-22 ENCOUNTER — Ambulatory Visit: Payer: Medicare Other | Admitting: Internal Medicine

## 2015-09-22 MED ORDER — POTASSIUM CHLORIDE ER 20 MEQ PO TBCR: 10.0000 meq | EXTENDED_RELEASE_TABLET | Freq: Every day | ORAL | Status: DC

## 2015-09-23 ENCOUNTER — Other Ambulatory Visit (HOSPITAL_COMMUNITY): Payer: Self-pay | Admitting: Internal Medicine

## 2015-09-25 ENCOUNTER — Encounter (HOSPITAL_COMMUNITY): Payer: Self-pay | Admitting: Emergency Medicine

## 2015-09-25 ENCOUNTER — Emergency Department (HOSPITAL_COMMUNITY): Payer: Medicare Other

## 2015-09-25 ENCOUNTER — Emergency Department (HOSPITAL_COMMUNITY)
Admission: EM | Admit: 2015-09-25 | Discharge: 2015-09-25 | Disposition: A | Discharge status: Home / Self Care | Payer: Medicare Other | Attending: Emergency Medicine | Admitting: Emergency Medicine

## 2015-09-25 DIAGNOSIS — I4891 Unspecified atrial fibrillation: Secondary | ICD-10-CM | POA: Insufficient documentation

## 2015-09-25 DIAGNOSIS — Z7951 Long term (current) use of inhaled steroids: Secondary | ICD-10-CM | POA: Insufficient documentation

## 2015-09-25 DIAGNOSIS — Y9222 Religious institution as the place of occurrence of the external cause: Secondary | ICD-10-CM | POA: Insufficient documentation

## 2015-09-25 DIAGNOSIS — Z9981 Dependence on supplemental oxygen: Secondary | ICD-10-CM | POA: Diagnosis not present

## 2015-09-25 DIAGNOSIS — G4733 Obstructive sleep apnea (adult) (pediatric): Secondary | ICD-10-CM | POA: Insufficient documentation

## 2015-09-25 DIAGNOSIS — Z86718 Personal history of other venous thrombosis and embolism: Secondary | ICD-10-CM | POA: Insufficient documentation

## 2015-09-25 DIAGNOSIS — W01198A Fall on same level from slipping, tripping and stumbling with subsequent striking against other object, initial encounter: Secondary | ICD-10-CM | POA: Insufficient documentation

## 2015-09-25 DIAGNOSIS — Z87891 Personal history of nicotine dependence: Secondary | ICD-10-CM | POA: Insufficient documentation

## 2015-09-25 DIAGNOSIS — I2510 Atherosclerotic heart disease of native coronary artery without angina pectoris: Secondary | ICD-10-CM | POA: Insufficient documentation

## 2015-09-25 DIAGNOSIS — I10 Essential (primary) hypertension: Secondary | ICD-10-CM | POA: Insufficient documentation

## 2015-09-25 DIAGNOSIS — Z8543 Personal history of malignant neoplasm of ovary: Secondary | ICD-10-CM | POA: Diagnosis not present

## 2015-09-25 DIAGNOSIS — Z86010 Personal history of colonic polyps: Secondary | ICD-10-CM | POA: Diagnosis not present

## 2015-09-25 DIAGNOSIS — E039 Hypothyroidism, unspecified: Secondary | ICD-10-CM | POA: Insufficient documentation

## 2015-09-25 DIAGNOSIS — E669 Obesity, unspecified: Secondary | ICD-10-CM | POA: Diagnosis not present

## 2015-09-25 DIAGNOSIS — Z9889 Other specified postprocedural states: Secondary | ICD-10-CM | POA: Diagnosis not present

## 2015-09-25 DIAGNOSIS — W19XXXA Unspecified fall, initial encounter: Secondary | ICD-10-CM

## 2015-09-25 DIAGNOSIS — Z7901 Long term (current) use of anticoagulants: Secondary | ICD-10-CM | POA: Insufficient documentation

## 2015-09-25 DIAGNOSIS — Y9389 Activity, other specified: Secondary | ICD-10-CM | POA: Insufficient documentation

## 2015-09-25 DIAGNOSIS — Z87440 Personal history of urinary (tract) infections: Secondary | ICD-10-CM | POA: Diagnosis not present

## 2015-09-25 DIAGNOSIS — Z23 Encounter for immunization: Secondary | ICD-10-CM | POA: Insufficient documentation

## 2015-09-25 DIAGNOSIS — J449 Chronic obstructive pulmonary disease, unspecified: Secondary | ICD-10-CM | POA: Insufficient documentation

## 2015-09-25 DIAGNOSIS — E119 Type 2 diabetes mellitus without complications: Secondary | ICD-10-CM | POA: Diagnosis not present

## 2015-09-25 DIAGNOSIS — Z79899 Other long term (current) drug therapy: Secondary | ICD-10-CM | POA: Diagnosis not present

## 2015-09-25 DIAGNOSIS — Z794 Long term (current) use of insulin: Secondary | ICD-10-CM | POA: Diagnosis not present

## 2015-09-25 DIAGNOSIS — M1990 Unspecified osteoarthritis, unspecified site: Secondary | ICD-10-CM | POA: Insufficient documentation

## 2015-09-25 DIAGNOSIS — Z862 Personal history of diseases of the blood and blood-forming organs and certain disorders involving the immune mechanism: Secondary | ICD-10-CM | POA: Diagnosis not present

## 2015-09-25 DIAGNOSIS — S0181XA Laceration without foreign body of other part of head, initial encounter: Secondary | ICD-10-CM | POA: Diagnosis not present

## 2015-09-25 DIAGNOSIS — IMO0002 Reserved for concepts with insufficient information to code with codable children: Secondary | ICD-10-CM

## 2015-09-25 DIAGNOSIS — Z8673 Personal history of transient ischemic attack (TIA), and cerebral infarction without residual deficits: Secondary | ICD-10-CM | POA: Diagnosis not present

## 2015-09-25 DIAGNOSIS — Z872 Personal history of diseases of the skin and subcutaneous tissue: Secondary | ICD-10-CM | POA: Diagnosis not present

## 2015-09-25 DIAGNOSIS — Y999 Unspecified external cause status: Secondary | ICD-10-CM | POA: Diagnosis not present

## 2015-09-25 DIAGNOSIS — S0990XA Unspecified injury of head, initial encounter: Secondary | ICD-10-CM | POA: Diagnosis present

## 2015-09-25 DIAGNOSIS — Z8719 Personal history of other diseases of the digestive system: Secondary | ICD-10-CM | POA: Diagnosis not present

## 2015-09-25 DIAGNOSIS — I5031 Acute diastolic (congestive) heart failure: Secondary | ICD-10-CM | POA: Diagnosis not present

## 2015-09-25 LAB — CBC WITH DIFFERENTIAL/PLATELET
BASOS PCT: 0 %
Basophils Absolute: 0 10*3/uL (ref 0.0–0.1)
Eosinophils Absolute: 0.2 10*3/uL (ref 0.0–0.7)
Eosinophils Relative: 2 %
HEMATOCRIT: 39 % (ref 36.0–46.0)
HEMOGLOBIN: 12.7 g/dL (ref 12.0–15.0)
Lymphocytes Relative: 21 %
Lymphs Abs: 1.4 10*3/uL (ref 0.7–4.0)
MCH: 32 pg (ref 26.0–34.0)
MCHC: 32.6 g/dL (ref 30.0–36.0)
MCV: 98.2 fL (ref 78.0–100.0)
Monocytes Absolute: 0.6 10*3/uL (ref 0.1–1.0)
Monocytes Relative: 9 %
NEUTROS ABS: 4.7 10*3/uL (ref 1.7–7.7)
NEUTROS PCT: 68 %
Platelets: 142 10*3/uL — ABNORMAL LOW (ref 150–400)
RBC: 3.97 MIL/uL (ref 3.87–5.11)
RDW: 16 % — ABNORMAL HIGH (ref 11.5–15.5)
WBC: 6.9 10*3/uL (ref 4.0–10.5)

## 2015-09-25 LAB — BASIC METABOLIC PANEL
Anion gap: 11 (ref 5–15)
BUN: 40 mg/dL — AB (ref 6–20)
CHLORIDE: 102 mmol/L (ref 101–111)
CO2: 25 mmol/L (ref 22–32)
CREATININE: 2.14 mg/dL — AB (ref 0.44–1.00)
Calcium: 9.6 mg/dL (ref 8.9–10.3)
GFR calc non Af Amer: 21 mL/min — ABNORMAL LOW (ref >60)
GFR, EST AFRICAN AMERICAN: 24 mL/min — AB (ref >60)
Glucose, Bld: 169 mg/dL — ABNORMAL HIGH (ref 65–99)
Potassium: 4.1 mmol/L (ref 3.5–5.1)
Sodium: 138 mmol/L (ref 135–145)

## 2015-09-25 LAB — URINALYSIS, ROUTINE W REFLEX MICROSCOPIC
BILIRUBIN URINE: NEGATIVE
Glucose, UA: NEGATIVE mg/dL
Hgb urine dipstick: NEGATIVE
Ketones, ur: NEGATIVE mg/dL
NITRITE: NEGATIVE
Protein, ur: 100 mg/dL — AB
SPECIFIC GRAVITY, URINE: 1.013 (ref 1.005–1.030)
pH: 5 (ref 5.0–8.0)

## 2015-09-25 LAB — URINE MICROSCOPIC-ADD ON: RBC / HPF: NONE SEEN RBC/hpf (ref 0–5)

## 2015-09-25 MED ORDER — ACETAMINOPHEN 325 MG PO TABS
650.0000 mg | ORAL_TABLET | Freq: Once | ORAL | Status: AC
  Administered 2015-09-25: 650 mg via ORAL
  Filled 2015-09-25: qty 2

## 2015-09-25 MED ORDER — TETANUS-DIPHTH-ACELL PERTUSSIS 5-2.5-18.5 LF-MCG/0.5 IM SUSP
0.5000 mL | Freq: Once | INTRAMUSCULAR | Status: AC
  Administered 2015-09-25: 0.5 mL via INTRAMUSCULAR
  Filled 2015-09-25: qty 0.5

## 2015-09-25 NOTE — ED Provider Notes (Signed)
CSN: [id number]     Arrival date & time 09/25/15  1041 History   First MD Initiated Contact with Patient 09/25/15 1042     Chief Complaint  Patient presents with  . Near Syncope   (Consider location/radiation/quality/duration/timing/severity/associated sxs/prior Treatment) Patient is a 82 y.o. female presenting with fall. The history is provided by the patient, the spouse and a relative. No language interpreter was used.  Fall Associated symptoms include headaches.    Ms. mccullick is a 82 y.o female with a history of CHF, CAD, hypertension, COPD, diabetes, DVT, ovarian cancer, stroke, and several other chronic diseases who presents with a near syncopal episode while at church today. She states she felt lightheaded this morning when she woke up and does not know how or why she fell. She normally walks with a walker but did not use it today. She was holding her daughter-in-law's hand to keep steady while at church. Daughter-in-law states that she did not see her trip the patient mentions that she might have and hit her head on the metal door frame. She could not get up for several minutes even with assistance because she had a headache. She is on Eliquis for A. fib. She reports going in and out of this all the time. He denies any loss of consciousness during the incident. She is normally on 2 L of oxygen at home. She does not know her tetanus status. She has an abrasion to the left side of her for head. Denies treatment prior to arrival. Currently on antibiotics for UTI.  Denies any chest pain, shortness of breath, abdominal pain, nausea, vomiting, or diarrhea.   Past Medical History  Diagnosis Date  . CAD (coronary artery disease)   . CHF (congestive heart failure) ([place])   . Paroxysmal atrial fibrillation (HCC)   . Bradycardia   . Hypertension   . Renal artery stenosis ([place])   . COPD (chronic obstructive pulmonary disease) ([place])   . Obesity hypoventilation syndrome ([place])   . Pruritus   .  Tracheobronchitis   . Acute rhinosinusitis   . Anemia   . Hypoxemia   . Peripheral edema   . Obesity, endogenous   . Diabetes mellitus   . Acid reflux disease   . Osteoarthritis, generalized   . Hypothyroidism   . Lung nodule     lingula  . OSA (obstructive sleep apnea)     NPSG 04/03/00--AHI 28.hr  . DVT (deep venous thrombosis) ([place]) 2003    left leg  . Vaginal atrophy   . Hx of colonic polyps   . Acute diastolic heart failure ([place])   . Secondary pulmonary hypertension ([place]) 06/09/2013  . Blood transfusion without reported diagnosis 2009    had internal bleeding  . Cancer ([place]) 1959    ovarian cancer  . Osteoporosis   . Stroke [facility])    Past Surgical History  Procedure Laterality Date  . Lumbar spine surgery    . Cholecystectomy    . Hemorroidectomy    . Repair wound fistula    . Right shoulder    . Elbow surgery    . Dilation and curettage of uterus    . Breast surgery  1998    lumpectomy  . Oophorectomy  1959    BSO-? ovarian cancer  . Bilateral salpingoophorectomy  1959    -d/t ovarian cancer  . Rotator cuff repair Right   . Cardiac catheterization N/A 03/21/2015    Procedure: Right Heart Cath;  Surgeon: Shaune Pascal Bensimhon,  MD;  Location: [facility];  Service: Cardiovascular;  Laterality: N/A;   Family History  Problem Relation Age of Onset  . Pneumonia Mother   . Heart disease Mother   . Allergies Mother   . Emphysema Mother   . Arthritis Mother   . Hypertension Mother   . Heart attack Father   . Hypertension Father   . Diabetes Sister   . Heart disease Sister   . Diabetes Brother   . Diabetes      grandfather  . Breast cancer      aunt  . Bone cancer      aunt  . Clotting disorder Brother   . Arthritis Sister   . Breast cancer Maternal Aunt    Social History  Substance Use Topics  . Smoking status: Former Smoker    Quit date: 08/13/1978  . Smokeless tobacco: Never Used  . Alcohol Use: No   OB History    Gravida Para Term Preterm  AB TAB SAB Ectopic Multiple Living   0               Obstetric Comments   2 adopted children     Review of Systems  Neurological: Positive for headaches.  All other systems reviewed and are negative.     Allergies  Cefuroxime axetil; Cephalexin; Clarithromycin; Doxycycline; Erythromycin; Tetracycline; and Bactrim  Home Medications   Prior to Admission medications   Medication Sig Start Date End Date Taking? Authorizing Provider  acetaminophen (TYLENOL) 500 MG tablet Take 1,000 mg by mouth every 6 (six) hours as needed for mild pain.     Historical Provider, MD  albuterol (PROVENTIL HFA;VENTOLIN HFA) 108 (90 BASE) MCG/ACT inhaler Inhale 1 puff into the lungs every 4 (four) hours as needed for wheezing or shortness of breath.    Historical Provider, MD  albuterol (PROVENTIL) (2.5 MG/3ML) 0.083% nebulizer solution Take 3 mLs (2.5 mg total) by nebulization every 2 (two) hours as needed for wheezing or shortness of breath. 03/25/15   Shanker Kristeen Mans, MD  allopurinol (ZYLOPRIM) 100 MG tablet Take 1 tablet (100 mg total) by mouth daily. 07/21/13   Samella Parr, NP  amLODipine (NORVASC) 2.5 MG tablet Take 1 tablet (2.5 mg total) by mouth daily. 05/17/15   Jolaine Artist, MD  apixaban (ELIQUIS) 2.5 MG TABS tablet Take 2.5 mg by mouth 2 (two) times daily.    Historical Provider, MD  azelastine (ASTELIN) 137 MCG/SPRAY nasal spray 1-2 sprays each nostril once or twice daily 11/05/13   Deneise Lever, MD  bisacodyl (DULCOLAX) 5 MG EC tablet Take 5 mg by mouth daily as needed for moderate constipation.    Historical Provider, MD  budesonide (PULMICORT) 0.25 MG/2ML nebulizer solution Take 2 mLs (0.25 mg total) by nebulization daily. 05/13/14   Deneise Lever, MD  calcitRIOL (ROCALTROL) 0.25 MCG capsule Take 0.25 mcg by mouth daily.  06/06/15   Historical Provider, MD  colchicine 0.6 MG tablet Take 1.2 mg by mouth daily as needed (for gout flareups).     Historical Provider, MD  CRESTOR 20 MG  tablet Take 20 mg by mouth daily.  06/10/15   Historical Provider, MD  diltiazem (CARDIZEM CD) 120 MG 24 hr capsule Take 1 capsule (120 mg total) by mouth daily. 03/25/15   Shanker Kristeen Mans, MD  furosemide (LASIX) 20 MG tablet Take 3 tablets (60 mg total) by mouth 2 (two) times daily. 07/19/15   Amy Estrella Deeds, NP  guaiFENesin (  MUCINEX) 600 MG 12 hr tablet Take 600 mg by mouth daily.    Historical Provider, MD  insulin glargine (LANTUS) 100 UNIT/ML injection Inject 25-35 Units into the skin 2 (two) times daily. Inject 25 units in the morning and 35 units in the evening    Historical Provider, MD  insulin lispro (HUMALOG) 100 UNIT/ML injection Inject 2 Units into the skin 2 (two) times daily with breakfast and lunch. Per sliding scale. Patient unsure about how much she really takes    Historical Provider, MD  isosorbide mononitrate (IMDUR) 30 MG 24 hr tablet Take 30 mg by mouth daily.    Historical Provider, MD  levothyroxine (SYNTHROID, LEVOTHROID) 25 MCG tablet Take 25 mcg by mouth See admin instructions. Take 1 tablet (25 mcg) on MON, WED, FRI, SUN    Historical Provider, MD  levothyroxine (SYNTHROID, LEVOTHROID) 50 MCG tablet Take 50 mcg by mouth See admin instructions. Take 50mg  on Tues, Thurs, Sat    Historical Provider, MD  methocarbamol (ROBAXIN) 500 MG tablet Take 500 mg by mouth at bedtime.    Historical Provider, MD  metolazone (ZAROXOLYN) 2.5 MG tablet Take 1 tablet (2.5 mg total) by mouth as needed. Patient not taking: Reported on 09/19/2015 07/05/15   Jolaine Artist, MD  metoprolol (LOPRESSOR) 50 MG tablet Take 50 mg by mouth 2 (two) times daily. Reported on 09/19/2015    Historical Provider, MD  nitroGLYCERIN (NITROSTAT) 0.4 MG SL tablet Place 0.4 mg under the tongue every 5 (five) minutes as needed for chest pain.     Historical Provider, MD  ondansetron (ZOFRAN) 4 MG tablet Take 1 tablet (4 mg total) by mouth every 8 (eight) hours as needed for nausea or vomiting. 07/19/15   Amy D Ninfa Meeker, NP   oxybutynin (DITROPAN XL) 10 MG 24 hr tablet Take 10 mg by mouth daily.     Historical Provider, MD  potassium chloride 20 MEQ TBCR Take 10 mEq by mouth daily. 09/22/15   Larey Dresser, MD  traMADol (ULTRAM) 50 MG tablet Take 50 mg by mouth every 6 (six) hours as needed for moderate pain.     Historical Provider, MD   BP 137/71 mmHg  Pulse 89  Temp(Src) 97.6 F (36.4 C) (Oral)  Resp 18  Ht 5\' 1"  (1.549 m)  Wt 72.576 kg  BMI 30.25 kg/m2  SpO2 95%  LMP 08/13/1957 Physical Exam  Constitutional: She is oriented to person, place, and time. Vital signs are normal. She appears well-developed and well-nourished.  Well-appearing and in no acute distress.  HENT:  Head: Normocephalic and atraumatic.  Eyes: Conjunctivae are normal.  No periorbital erythema or ecchymosis. No battle signs. No rib tenderness.  Neck: Normal range of motion. Neck supple.  Cardiovascular: Normal rate and normal heart sounds.  An irregularly irregular rhythm present.  No murmur.   Pulmonary/Chest: Effort normal and breath sounds normal.  Lungs clear to auscultation bilaterally. No wheezing. No decreased breath sounds.  Abdominal: Soft. There is no tenderness.  Abdomen is soft and nontender.  Musculoskeletal: Normal range of motion.  Moving all extremities without difficulty.  Neurological: She is alert and oriented to person, place, and time. She has normal strength. No sensory deficit. GCS eye subscore is 4. GCS verbal subscore is 5. GCS motor subscore is 6.  GCS 15. No sensory or motor deficit. Able to move all extremities appropriately. Cranial nerves III through XII intact. Answering questions appropriately. No facial droop or difficulty with speech.  Skin: Skin is  warm and dry.  3 mm left for head laceration with no active bleeding.  Nursing note and vitals reviewed.   ED Course  Wound closure utilizing adhes only Date/Time: 09/25/2015 12:00 PM Performed by: Ottie Glazier Authorized by: Ottie Glazier Consent: Verbal consent obtained. Written consent obtained. Risks and benefits: risks, benefits and alternatives were discussed Consent given by: patient Patient understanding: patient states understanding of the procedure being performed Patient consent: the patient's understanding of the procedure matches consent given Procedure consent: procedure consent matches procedure scheduled Relevant documents: relevant documents present and verified Test results: test results available and properly labeled Site marked: the operative site was marked Imaging studies: imaging studies available Patient identity confirmed: verbally with patient Preparation: Patient was prepped and draped in the usual sterile fashion. Local anesthesia used: no Patient sedated: no Patient tolerance: Patient tolerated the procedure well with no immediate complications Comments: Dermabond was used to close the laceration on the left side of the forehead.   (including critical care time)    Labs Review Labs Reviewed  CBC WITH DIFFERENTIAL/PLATELET - Abnormal; Notable for the following:    RDW 16.0 (*)    Platelets 142 (*)    All other components within normal limits  BASIC METABOLIC PANEL - Abnormal; Notable for the following:    Glucose, Bld 169 (*)    BUN 40 (*)    Creatinine, Ser 2.14 (*)    GFR calc non Af Amer 21 (*)    GFR calc Af Amer 24 (*)    All other components within normal limits  URINALYSIS, ROUTINE W REFLEX MICROSCOPIC (NOT AT [facility]) - Abnormal; Notable for the following:    Protein, ur 100 (*)    Leukocytes, UA SMALL (*)    All other components within normal limits  URINE MICROSCOPIC-ADD ON - Abnormal; Notable for the following:    Squamous Epithelial / LPF 6-30 (*)    Bacteria, UA FEW (*)    Casts GRANULAR CAST (*)    All other components within normal limits  URINE CULTURE    Imaging Review Ct Head Wo Contrast  09/25/2015  CLINICAL DATA:  Near syncopal episode. Post fall  hitting head on door frame. Abrasion to the left forehead. EXAM: CT HEAD WITHOUT CONTRAST TECHNIQUE: Contiguous axial images were obtained from the base of the skull through the vertex without intravenous contrast. COMPARISON:  03/16/2015; 12/05/2014 ; 12/04/2014; brain MRI- 12/04/2014 FINDINGS: There is a minimal amount of subcutaneous edema and several tiny scattered foci of subcutaneous emphysema about the superior lateral aspect of the left forehead (representative images 21 through 26, series 3). No associated radiopaque foreign body or displaced calvarial fracture. Similar findings of advanced atrophy with sulcal prominence and centralized volume loss with commensurate ex vacuo dilatation of the ventricular system. Rather extensive periventricular hypodensities compatible with microvascular ischemic disease, grossly unchanged. Bilateral basal ganglial calcifications. Given extensive background parenchymal abnormalities, there is no CT evidence of superimposed acute large territory infarct. No intraparenchymal or extra-axial mass or hemorrhage. Unchanged size and configuration of the ventricles and the basilar cisterns. No midline shift. Intracranial atherosclerosis. Limited visualization the paranasal sinuses and mastoid air cells is normal. No air-fluid levels. Post bilateral cataract surgery. IMPRESSION: 1. Subcutaneous stranding and edema about the superior lateral aspect the left side of the forehead without associated radiopaque foreign body, displaced calvarial fracture or acute intracranial process. 2. Similar findings of atrophy and microvascular ischemic disease. Electronically Signed   By: Eldridge Abrahams.D.  On: 09/25/2015 12:25   I have personally reviewed and evaluated these images and lab results as part of my medical decision-making.   EKG Interpretation   Date/Time:  Sunday September 25 2015 10:51:06 EST Ventricular Rate:  85 PR Interval:    QRS Duration: 125 QT Interval:  430 QTC  Calculation: 511 R Axis:   86 Text Interpretation:  Atrial fibrillation IVCD, consider atypical RBBB  Baseline wander in lead(s) I III aVL V5 V6 No significant change since  last tracing Confirmed by FLOYD MD, Quillian Quince [id number]) on 09/25/2015 11:12:30  AM      MDM   Final diagnoses:  Head injury, initial encounter  Fall, initial encounter  Laceration   She presents for witnessed fall while at church. Unknown if it was mechanical versus dizziness. She denies any chest pain or shortness of breath. She has an extensive medical history. Vital signs remained stable throughout her stay in the ED. She is on 2 L of oxygen at home. Kidney function is poor but comparable to previous labs. No anemia. UA is not concerning. Patient on antibiotics for UTI. Urine culture sent. Patient's neuro exam is normal. She has a small laceration to the left side of her forehead which was derma bonded. She was unaware of tetanus status and I could not find a previous record of having one. She was given a tetanus shot. She denies any complaints of cough and has normal breath sounds on exam. Chest x-ray was not obtained. CT head shows some edema and subcutaneous stranding around the superior left side of the forehead at the laceration site no displaced calvarial fracture or intercranial process. She was also seen and evaluated by Dr. Tyrone Nine who agrees with plan to send patient home. I discussed findings with the family. She can follow-up with her PCP and they agree with plan.  Dr. Tyrone Nine seen and evaluated the patient and agrees with the plan.       Ottie Glazier, PA-C 09/25/15 [place], DO 09/25/15 [place], DO 09/26/15 0710

## 2015-09-25 NOTE — ED Notes (Signed)
Pt was at church and had near syncopal episode. Pt fell and hit head on door frame but did not pass out. Pt is on blood thinner- abrasion to left forehead. Pt is alert and oriented. Pt 2L [place] at home. BP 134/94, HR 66, 90% on 2L- placed on 3L  and sats improved to 95%. CBG 177.

## 2015-09-25 NOTE — ED Notes (Signed)
Pt able to pivot to use bedside commode. Pt tolerated well

## 2015-09-25 NOTE — ED Notes (Signed)
Cleansed pt's wound and placed dermabond on site. Pt tolerated well. Bleeding controlled

## 2015-09-25 NOTE — Discharge Instructions (Signed)
Concussion, Adult Follow up with your primary care provider. Return for chest pain, shortness of breath, or dizziness. A concussion, or closed-head injury, is a brain injury caused by a direct blow to the head or by a quick and sudden movement (jolt) of the head or neck. Concussions are usually not life-threatening. Even so, the effects of a concussion can be serious. If you have had a concussion before, you are more likely to experience concussion-like symptoms after a direct blow to the head.  CAUSES  Direct blow to the head, such as from running into another player during a soccer game, being hit in a fight, or hitting your head on a hard surface.  A jolt of the head or neck that causes the brain to move back and forth inside the skull, such as in a car crash. SIGNS AND SYMPTOMS The signs of a concussion can be hard to notice. Early on, they may be missed by you, family members, and health care providers. You may look fine but act or feel differently. Symptoms are usually temporary, but they may last for days, weeks, or even longer. Some symptoms may appear right away while others may not show up for hours or days. Every head injury is different. Symptoms include:  Mild to moderate headaches that will not go away.  A feeling of pressure inside your head.  Having more trouble than usual:  Learning or remembering things you have heard.  Answering questions.  Paying attention or concentrating.  Organizing daily tasks.  Making decisions and solving problems.  Slowness in thinking, acting or reacting, speaking, or reading.  Getting lost or being easily confused.  Feeling tired all the time or lacking energy (fatigued).  Feeling drowsy.  Sleep disturbances.  Sleeping more than usual.  Sleeping less than usual.  Trouble falling asleep.  Trouble sleeping (insomnia).  Loss of balance or feeling lightheaded or dizzy.  Nausea or vomiting.  Numbness or tingling.  Increased  sensitivity to:  Sounds.  Lights.  Distractions.  Vision problems or eyes that tire easily.  Diminished sense of taste or smell.  Ringing in the ears.  Mood changes such as feeling sad or anxious.  Becoming easily irritated or angry for little or no reason.  Lack of motivation.  Seeing or hearing things other people do not see or hear (hallucinations). DIAGNOSIS Your health care provider can usually diagnose a concussion based on a description of your injury and symptoms. He or she will ask whether you passed out (lost consciousness) and whether you are having trouble remembering events that happened right before and during your injury. Your evaluation might include:  A brain scan to look for signs of injury to the brain. Even if the test shows no injury, you may still have a concussion.  Blood tests to be sure other problems are not present. TREATMENT  Concussions are usually treated in an emergency department, in urgent care, or at a clinic. You may need to stay in the hospital overnight for further treatment.  Tell your health care provider if you are taking any medicines, including prescription medicines, over-the-counter medicines, and natural remedies. Some medicines, such as blood thinners (anticoagulants) and aspirin, may increase the chance of complications. Also tell your health care provider whether you have had alcohol or are taking illegal drugs. This information may affect treatment.  Your health care provider will send you home with important instructions to follow.  How fast you will recover from a concussion depends on many  factors. These factors include how severe your concussion is, what part of your brain was injured, your age, and how healthy you were before the concussion.  Most people with mild injuries recover fully. Recovery can take time. In general, recovery is slower in older persons. Also, persons who have had a concussion in the past or have other  medical problems may find that it takes longer to recover from their current injury. HOME CARE INSTRUCTIONS General Instructions  Carefully follow the directions your health care provider gave you.  Only take over-the-counter or prescription medicines for pain, discomfort, or fever as directed by your health care provider.  Take only those medicines that your health care provider has approved.  Do not drink alcohol until your health care provider says you are well enough to do so. Alcohol and certain other drugs may slow your recovery and can put you at risk of further injury.  If it is harder than usual to remember things, write them down.  If you are easily distracted, try to do one thing at a time. For example, do not try to watch TV while fixing dinner.  Talk with family members or close friends when making important decisions.  Keep all follow-up appointments. Repeated evaluation of your symptoms is recommended for your recovery.  Watch your symptoms and tell others to do the same. Complications sometimes occur after a concussion. Older adults with a brain injury may have a higher risk of serious complications, such as a blood clot on the brain.  Tell your teachers, school nurse, school counselor, coach, athletic trainer, or work Freight forwarder about your injury, symptoms, and restrictions. Tell them about what you can or cannot do. They should watch for:  Increased problems with attention or concentration.  Increased difficulty remembering or learning new information.  Increased time needed to complete tasks or assignments.  Increased irritability or decreased ability to cope with stress.  Increased symptoms.  Rest. Rest helps the brain to heal. Make sure you:  Get plenty of sleep at night. Avoid staying up late at night.  Keep the same bedtime hours on weekends and weekdays.  Rest during the day. Take daytime naps or rest breaks when you feel tired.  Limit activities that  require a lot of thought or concentration. These include:  Doing homework or job-related work.  Watching TV.  Working on the computer.  Avoid any situation where there is potential for another head injury (football, hockey, soccer, basketball, martial arts, downhill snow sports and horseback riding). Your condition will get worse every time you experience a concussion. You should avoid these activities until you are evaluated by the appropriate follow-up health care providers. Returning To Your Regular Activities You will need to return to your normal activities slowly, not all at once. You must give your body and brain enough time for recovery.  Do not return to sports or other athletic activities until your health care provider tells you it is safe to do so.  Ask your health care provider when you can drive, ride a bicycle, or operate heavy machinery. Your ability to react may be slower after a brain injury. Never do these activities if you are dizzy.  Ask your health care provider about when you can return to work or school. Preventing Another Concussion It is very important to avoid another brain injury, especially before you have recovered. In rare cases, another injury can lead to permanent brain damage, brain swelling, or death. The risk of this is  greatest during the first 7-10 days after a head injury. Avoid injuries by:  Wearing a seat belt when riding in a car.  Drinking alcohol only in moderation.  Wearing a helmet when biking, skiing, skateboarding, skating, or doing similar activities.  Avoiding activities that could lead to a second concussion, such as contact or recreational sports, until your health care provider says it is okay.  Taking safety measures in your home.  Remove clutter and tripping hazards from floors and stairways.  Use grab bars in bathrooms and handrails by stairs.  Place non-slip mats on floors and in bathtubs.  Improve lighting in dim  areas. SEEK MEDICAL CARE IF:  You have increased problems paying attention or concentrating.  You have increased difficulty remembering or learning new information.  You need more time to complete tasks or assignments than before.  You have increased irritability or decreased ability to cope with stress.  You have more symptoms than before. Seek medical care if you have any of the following symptoms for more than 2 weeks after your injury:  Lasting (chronic) headaches.  Dizziness or balance problems.  Nausea.  Vision problems.  Increased sensitivity to noise or light.  Depression or mood swings.  Anxiety or irritability.  Memory problems.  Difficulty concentrating or paying attention.  Sleep problems.  Feeling tired all the time. SEEK IMMEDIATE MEDICAL CARE IF:  You have severe or worsening headaches. These may be a sign of a blood clot in the brain.  You have weakness (even if only in one hand, leg, or part of the face).  You have numbness.  You have decreased coordination.  You vomit repeatedly.  You have increased sleepiness.  One pupil is larger than the other.  You have convulsions.  You have slurred speech.  You have increased confusion. This may be a sign of a blood clot in the brain.  You have increased restlessness, agitation, or irritability.  You are unable to recognize people or places.  You have neck pain.  It is difficult to wake you up.  You have unusual behavior changes.  You lose consciousness. MAKE SURE YOU:  Understand these instructions.  Will watch your condition.  Will get help right away if you are not doing well or get worse.   This information is not intended to replace advice given to you by your health care provider. Make sure you discuss any questions you have with your health care provider.   Document Released: 10/20/2003 Document Revised: 08/20/2014 Document Reviewed: 02/19/2013 [employer] Interactive Patient  Education [employer].

## 2015-09-26 LAB — URINE CULTURE: Culture: >=100000

## 2015-10-31 ENCOUNTER — Other Ambulatory Visit (HOSPITAL_COMMUNITY): Payer: Self-pay | Admitting: *Deleted

## 2015-10-31 MED ORDER — METOLAZONE 2.5 MG PO TABS: 2.5000 mg | ORAL_TABLET | ORAL | Status: DC | PRN

## 2015-12-09 ENCOUNTER — Emergency Department (HOSPITAL_COMMUNITY): Payer: Medicare Other

## 2015-12-09 ENCOUNTER — Encounter (HOSPITAL_COMMUNITY): Payer: Self-pay | Admitting: Emergency Medicine

## 2015-12-09 ENCOUNTER — Inpatient Hospital Stay (HOSPITAL_COMMUNITY)
Admission: EM | Admit: 2015-12-09 | Discharge: 2015-12-12 | DRG: 640 | Disposition: A | Discharge status: Skilled Nursing Facility | Payer: Medicare Other | Attending: Internal Medicine | Admitting: Internal Medicine

## 2015-12-09 DIAGNOSIS — Y92009 Unspecified place in unspecified non-institutional (private) residence as the place of occurrence of the external cause: Secondary | ICD-10-CM | POA: Diagnosis not present

## 2015-12-09 DIAGNOSIS — M1990 Unspecified osteoarthritis, unspecified site: Secondary | ICD-10-CM | POA: Diagnosis present

## 2015-12-09 DIAGNOSIS — I4891 Unspecified atrial fibrillation: Secondary | ICD-10-CM | POA: Diagnosis present

## 2015-12-09 DIAGNOSIS — I130 Hypertensive heart and chronic kidney disease with heart failure and stage 1 through stage 4 chronic kidney disease, or unspecified chronic kidney disease: Secondary | ICD-10-CM | POA: Diagnosis present

## 2015-12-09 DIAGNOSIS — Z8541 Personal history of malignant neoplasm of cervix uteri: Secondary | ICD-10-CM | POA: Diagnosis not present

## 2015-12-09 DIAGNOSIS — Z794 Long term (current) use of insulin: Secondary | ICD-10-CM | POA: Diagnosis not present

## 2015-12-09 DIAGNOSIS — Z8543 Personal history of malignant neoplasm of ovary: Secondary | ICD-10-CM | POA: Diagnosis not present

## 2015-12-09 DIAGNOSIS — R296 Repeated falls: Secondary | ICD-10-CM | POA: Diagnosis present

## 2015-12-09 DIAGNOSIS — M810 Age-related osteoporosis without current pathological fracture: Secondary | ICD-10-CM | POA: Diagnosis present

## 2015-12-09 DIAGNOSIS — E039 Hypothyroidism, unspecified: Secondary | ICD-10-CM | POA: Diagnosis present

## 2015-12-09 DIAGNOSIS — Z9981 Dependence on supplemental oxygen: Secondary | ICD-10-CM | POA: Diagnosis not present

## 2015-12-09 DIAGNOSIS — G9340 Encephalopathy, unspecified: Secondary | ICD-10-CM | POA: Diagnosis present

## 2015-12-09 DIAGNOSIS — E1122 Type 2 diabetes mellitus with diabetic chronic kidney disease: Secondary | ICD-10-CM | POA: Diagnosis present

## 2015-12-09 DIAGNOSIS — I480 Paroxysmal atrial fibrillation: Secondary | ICD-10-CM | POA: Diagnosis present

## 2015-12-09 DIAGNOSIS — J9611 Chronic respiratory failure with hypoxia: Secondary | ICD-10-CM | POA: Diagnosis present

## 2015-12-09 DIAGNOSIS — N184 Chronic kidney disease, stage 4 (severe): Secondary | ICD-10-CM | POA: Diagnosis not present

## 2015-12-09 DIAGNOSIS — I2510 Atherosclerotic heart disease of native coronary artery without angina pectoris: Secondary | ICD-10-CM | POA: Diagnosis present

## 2015-12-09 DIAGNOSIS — Z96611 Presence of right artificial shoulder joint: Secondary | ICD-10-CM | POA: Diagnosis present

## 2015-12-09 DIAGNOSIS — I272 Pulmonary hypertension, unspecified: Secondary | ICD-10-CM

## 2015-12-09 DIAGNOSIS — J449 Chronic obstructive pulmonary disease, unspecified: Secondary | ICD-10-CM | POA: Diagnosis present

## 2015-12-09 DIAGNOSIS — E662 Morbid (severe) obesity with alveolar hypoventilation: Secondary | ICD-10-CM | POA: Diagnosis present

## 2015-12-09 DIAGNOSIS — Z87891 Personal history of nicotine dependence: Secondary | ICD-10-CM

## 2015-12-09 DIAGNOSIS — W06XXXA Fall from bed, initial encounter: Secondary | ICD-10-CM | POA: Diagnosis present

## 2015-12-09 DIAGNOSIS — M25551 Pain in right hip: Secondary | ICD-10-CM | POA: Diagnosis present

## 2015-12-09 DIAGNOSIS — IMO0002 Reserved for concepts with insufficient information to code with codable children: Secondary | ICD-10-CM | POA: Diagnosis present

## 2015-12-09 DIAGNOSIS — N179 Acute kidney failure, unspecified: Secondary | ICD-10-CM | POA: Diagnosis present

## 2015-12-09 DIAGNOSIS — Z9181 History of falling: Secondary | ICD-10-CM

## 2015-12-09 DIAGNOSIS — R1310 Dysphagia, unspecified: Secondary | ICD-10-CM | POA: Diagnosis present

## 2015-12-09 DIAGNOSIS — Z8673 Personal history of transient ischemic attack (TIA), and cerebral infarction without residual deficits: Secondary | ICD-10-CM

## 2015-12-09 DIAGNOSIS — E1165 Type 2 diabetes mellitus with hyperglycemia: Secondary | ICD-10-CM | POA: Diagnosis present

## 2015-12-09 DIAGNOSIS — I10 Essential (primary) hypertension: Secondary | ICD-10-CM | POA: Diagnosis present

## 2015-12-09 DIAGNOSIS — E860 Dehydration: Secondary | ICD-10-CM | POA: Diagnosis present

## 2015-12-09 DIAGNOSIS — E876 Hypokalemia: Secondary | ICD-10-CM | POA: Diagnosis present

## 2015-12-09 DIAGNOSIS — Z7901 Long term (current) use of anticoagulants: Secondary | ICD-10-CM

## 2015-12-09 DIAGNOSIS — Z86718 Personal history of other venous thrombosis and embolism: Secondary | ICD-10-CM | POA: Diagnosis not present

## 2015-12-09 DIAGNOSIS — I5032 Chronic diastolic (congestive) heart failure: Secondary | ICD-10-CM | POA: Diagnosis present

## 2015-12-09 DIAGNOSIS — R531 Weakness: Secondary | ICD-10-CM

## 2015-12-09 HISTORY — DX: Other chronic pain: G89.29

## 2015-12-09 LAB — BASIC METABOLIC PANEL
Anion gap: 12 (ref 5–15)
BUN: 39 mg/dL — ABNORMAL HIGH (ref 6–20)
CHLORIDE: 99 mmol/L — AB (ref 101–111)
CO2: 31 mmol/L (ref 22–32)
CREATININE: 1.79 mg/dL — AB (ref 0.44–1.00)
Calcium: >15 mg/dL (ref 8.9–10.3)
GFR calc non Af Amer: 26 mL/min — ABNORMAL LOW (ref >60)
GFR, EST AFRICAN AMERICAN: 30 mL/min — AB (ref >60)
GLUCOSE: 123 mg/dL — AB (ref 65–99)
Potassium: 2.7 mmol/L — CL (ref 3.5–5.1)
Sodium: 142 mmol/L (ref 135–145)

## 2015-12-09 LAB — CBC WITH DIFFERENTIAL/PLATELET
BASOS ABS: 0 10*3/uL (ref 0.0–0.1)
BASOS PCT: 0 %
EOS ABS: 0.1 10*3/uL (ref 0.0–0.7)
EOS PCT: 1 %
HCT: 42.6 % (ref 36.0–46.0)
HEMOGLOBIN: 14 g/dL (ref 12.0–15.0)
Lymphocytes Relative: 11 %
Lymphs Abs: 1.4 10*3/uL (ref 0.7–4.0)
MCH: 32.7 pg (ref 26.0–34.0)
MCHC: 32.9 g/dL (ref 30.0–36.0)
MCV: 99.5 fL (ref 78.0–100.0)
Monocytes Absolute: 0.8 10*3/uL (ref 0.1–1.0)
Monocytes Relative: 6 %
NEUTROS PCT: 82 %
Neutro Abs: 10.2 10*3/uL — ABNORMAL HIGH (ref 1.7–7.7)
PLATELETS: 169 10*3/uL (ref 150–400)
RBC: 4.28 MIL/uL (ref 3.87–5.11)
RDW: 14.9 % (ref 11.5–15.5)
WBC: 12.4 10*3/uL — AB (ref 4.0–10.5)

## 2015-12-09 LAB — URINE MICROSCOPIC-ADD ON

## 2015-12-09 LAB — I-STAT ARTERIAL BLOOD GAS, ED
ACID-BASE EXCESS: 11 mmol/L — AB (ref 0.0–2.0)
Bicarbonate: 34.9 mEq/L — ABNORMAL HIGH (ref 20.0–24.0)
O2 Saturation: 99 %
TCO2: 36 mmol/L (ref 0–100)
pCO2 arterial: 42.4 mmHg (ref 35.0–45.0)
pH, Arterial: 7.523 — ABNORMAL HIGH (ref 7.350–7.450)
pO2, Arterial: 109 mmHg — ABNORMAL HIGH (ref 80.0–100.0)

## 2015-12-09 LAB — GLUCOSE, CAPILLARY
Glucose-Capillary: 110 mg/dL — ABNORMAL HIGH (ref 65–99)
Glucose-Capillary: 113 mg/dL — ABNORMAL HIGH (ref 65–99)

## 2015-12-09 LAB — PHOSPHORUS: PHOSPHORUS: 2.5 mg/dL (ref 2.5–4.6)

## 2015-12-09 LAB — I-STAT TROPONIN, ED: TROPONIN I, POC: 0.06 ng/mL (ref 0.00–0.08)

## 2015-12-09 LAB — URINALYSIS, ROUTINE W REFLEX MICROSCOPIC
Bilirubin Urine: NEGATIVE
Glucose, UA: NEGATIVE mg/dL
Ketones, ur: NEGATIVE mg/dL
NITRITE: NEGATIVE
PROTEIN: 100 mg/dL — AB
SPECIFIC GRAVITY, URINE: 1.016 (ref 1.005–1.030)
pH: 5.5 (ref 5.0–8.0)

## 2015-12-09 LAB — HEPATIC FUNCTION PANEL
ALK PHOS: 50 U/L (ref 38–126)
ALT: 24 U/L (ref 14–54)
AST: 39 U/L (ref 15–41)
Albumin: 3.9 g/dL (ref 3.5–5.0)
BILIRUBIN DIRECT: 0.2 mg/dL (ref 0.1–0.5)
BILIRUBIN INDIRECT: 0.8 mg/dL (ref 0.3–0.9)
TOTAL PROTEIN: 7 g/dL (ref 6.5–8.1)
Total Bilirubin: 1 mg/dL (ref 0.3–1.2)

## 2015-12-09 LAB — VITAMIN B12: Vitamin B-12: 839 pg/mL (ref 180–914)

## 2015-12-09 LAB — CBG MONITORING, ED: Glucose-Capillary: 106 mg/dL — ABNORMAL HIGH (ref 65–99)

## 2015-12-09 LAB — TSH: TSH: 2.196 u[IU]/mL (ref 0.350–4.500)

## 2015-12-09 LAB — T4, FREE: FREE T4: 1.12 ng/dL (ref 0.61–1.12)

## 2015-12-09 LAB — MAGNESIUM: Magnesium: 1.8 mg/dL (ref 1.7–2.4)

## 2015-12-09 LAB — CORTISOL: CORTISOL PLASMA: 25.8 ug/dL

## 2015-12-09 MED ORDER — POTASSIUM CHLORIDE CRYS ER 20 MEQ PO TBCR
40.0000 meq | EXTENDED_RELEASE_TABLET | Freq: Once | ORAL | Status: AC
  Administered 2015-12-09: 40 meq via ORAL
  Filled 2015-12-09: qty 2

## 2015-12-09 MED ORDER — POTASSIUM CHLORIDE 10 MEQ/100ML IV SOLN
10.0000 meq | INTRAVENOUS | Status: AC
  Administered 2015-12-09 (×2): 10 meq via INTRAVENOUS
  Filled 2015-12-09 (×2): qty 100

## 2015-12-09 MED ORDER — INSULIN ASPART 100 UNIT/ML ~~LOC~~ SOLN
0.0000 [IU] | Freq: Four times a day (QID) | SUBCUTANEOUS | Status: DC
  Administered 2015-12-10 – 2015-12-11 (×2): 3 [IU] via SUBCUTANEOUS

## 2015-12-09 MED ORDER — BUDESONIDE 0.25 MG/2ML IN SUSP
0.2500 mg | Freq: Every day | RESPIRATORY_TRACT | Status: DC
  Administered 2015-12-10 – 2015-12-12 (×3): 0.25 mg via RESPIRATORY_TRACT
  Filled 2015-12-09 (×6): qty 2

## 2015-12-09 MED ORDER — INSULIN GLARGINE 100 UNIT/ML ~~LOC~~ SOLN
13.0000 [IU] | Freq: Every day | SUBCUTANEOUS | Status: DC
  Filled 2015-12-09: qty 0.13

## 2015-12-09 MED ORDER — INSULIN ASPART 100 UNIT/ML ~~LOC~~ SOLN: 0.0000 [IU] | Freq: Three times a day (TID) | SUBCUTANEOUS | Status: DC

## 2015-12-09 MED ORDER — POTASSIUM CHLORIDE 10 MEQ/100ML IV SOLN
10.0000 meq | Freq: Once | INTRAVENOUS | Status: AC
  Administered 2015-12-09: 10 meq via INTRAVENOUS
  Filled 2015-12-09: qty 100

## 2015-12-09 MED ORDER — FUROSEMIDE 40 MG PO TABS: 60.0000 mg | ORAL_TABLET | Freq: Two times a day (BID) | ORAL | Status: DC

## 2015-12-09 MED ORDER — MORPHINE SULFATE (PF) 2 MG/ML IV SOLN: 1.0000 mg | INTRAVENOUS | Status: DC | PRN

## 2015-12-09 MED ORDER — METOPROLOL TARTRATE 5 MG/5ML IV SOLN
5.0000 mg | Freq: Two times a day (BID) | INTRAVENOUS | Status: DC
  Administered 2015-12-09: 5 mg via INTRAVENOUS
  Filled 2015-12-09: qty 5

## 2015-12-09 MED ORDER — FUROSEMIDE 10 MG/ML IJ SOLN
40.0000 mg | INTRAMUSCULAR | Status: AC
  Administered 2015-12-09: 40 mg via INTRAVENOUS
  Filled 2015-12-09: qty 4

## 2015-12-09 MED ORDER — ONDANSETRON HCL 4 MG PO TABS
4.0000 mg | ORAL_TABLET | Freq: Four times a day (QID) | ORAL | Status: DC | PRN
  Administered 2015-12-12: 4 mg via ORAL
  Filled 2015-12-09: qty 1

## 2015-12-09 MED ORDER — ISOSORBIDE MONONITRATE ER 30 MG PO TB24: 30.0000 mg | ORAL_TABLET | Freq: Every day | ORAL | Status: DC

## 2015-12-09 MED ORDER — BISACODYL 5 MG PO TBEC: 5.0000 mg | DELAYED_RELEASE_TABLET | Freq: Every day | ORAL | Status: DC | PRN

## 2015-12-09 MED ORDER — METOPROLOL TARTRATE 5 MG/5ML IV SOLN: 5.0000 mg | Freq: Four times a day (QID) | INTRAVENOUS | Status: DC

## 2015-12-09 MED ORDER — ALBUTEROL SULFATE HFA 108 (90 BASE) MCG/ACT IN AERS: 1.0000 | INHALATION_SPRAY | RESPIRATORY_TRACT | Status: DC | PRN

## 2015-12-09 MED ORDER — SODIUM CHLORIDE 0.9 % IV SOLN
90.0000 mg | Freq: Once | INTRAVENOUS | Status: AC
  Administered 2015-12-09: 90 mg via INTRAVENOUS
  Filled 2015-12-09: qty 10

## 2015-12-09 MED ORDER — CALCITONIN (SALMON) 200 UNIT/ML IJ SOLN
266.0000 [IU] | Freq: Two times a day (BID) | INTRAMUSCULAR | Status: AC
  Administered 2015-12-09 – 2015-12-10 (×3): 266 [IU] via INTRAMUSCULAR
  Filled 2015-12-09 (×3): qty 1.33

## 2015-12-09 MED ORDER — INSULIN ASPART 100 UNIT/ML ~~LOC~~ SOLN: 0.0000 [IU] | Freq: Every day | SUBCUTANEOUS | Status: DC

## 2015-12-09 MED ORDER — FENTANYL CITRATE (PF) 100 MCG/2ML IJ SOLN
50.0000 ug | Freq: Once | INTRAMUSCULAR | Status: AC
  Administered 2015-12-09: 50 ug via INTRAVENOUS
  Filled 2015-12-09: qty 2

## 2015-12-09 MED ORDER — ENOXAPARIN SODIUM 30 MG/0.3ML ~~LOC~~ SOLN
30.0000 mg | SUBCUTANEOUS | Status: DC
  Administered 2015-12-09 – 2015-12-11 (×3): 30 mg via SUBCUTANEOUS
  Filled 2015-12-09 (×3): qty 0.3

## 2015-12-09 MED ORDER — METOPROLOL TARTRATE 5 MG/5ML IV SOLN
10.0000 mg | Freq: Four times a day (QID) | INTRAVENOUS | Status: DC
  Administered 2015-12-09 – 2015-12-12 (×11): 10 mg via INTRAVENOUS
  Filled 2015-12-09 (×13): qty 10

## 2015-12-09 MED ORDER — CETYLPYRIDINIUM CHLORIDE 0.05 % MT LIQD
7.0000 mL | Freq: Two times a day (BID) | OROMUCOSAL | Status: DC
  Administered 2015-12-11 – 2015-12-12 (×2): 7 mL via OROMUCOSAL

## 2015-12-09 MED ORDER — CHLORHEXIDINE GLUCONATE 0.12 % MT SOLN
15.0000 mL | Freq: Two times a day (BID) | OROMUCOSAL | Status: DC
  Administered 2015-12-10 – 2015-12-12 (×5): 15 mL via OROMUCOSAL
  Filled 2015-12-09 (×4): qty 15

## 2015-12-09 MED ORDER — SODIUM CHLORIDE 0.9 % IV BOLUS (SEPSIS)
500.0000 mL | Freq: Once | INTRAVENOUS | Status: AC
  Administered 2015-12-09: 500 mL via INTRAVENOUS

## 2015-12-09 MED ORDER — SODIUM CHLORIDE 0.9 % IV SOLN
INTRAVENOUS | Status: DC
  Administered 2015-12-09 – 2015-12-11 (×6): via INTRAVENOUS

## 2015-12-09 MED ORDER — SODIUM CHLORIDE 0.9% FLUSH
3.0000 mL | Freq: Two times a day (BID) | INTRAVENOUS | Status: DC
  Administered 2015-12-10 – 2015-12-12 (×5): 3 mL via INTRAVENOUS

## 2015-12-09 MED ORDER — APIXABAN 2.5 MG PO TABS: 2.5000 mg | ORAL_TABLET | Freq: Two times a day (BID) | ORAL | Status: DC

## 2015-12-09 MED ORDER — ACETAMINOPHEN 650 MG RE SUPP
650.0000 mg | Freq: Four times a day (QID) | RECTAL | Status: DC | PRN
  Administered 2015-12-10: 650 mg via RECTAL
  Filled 2015-12-09: qty 1

## 2015-12-09 MED ORDER — ALLOPURINOL 100 MG PO TABS: 100.0000 mg | ORAL_TABLET | Freq: Two times a day (BID) | ORAL | Status: DC

## 2015-12-09 MED ORDER — ACETAMINOPHEN 325 MG PO TABS
650.0000 mg | ORAL_TABLET | Freq: Four times a day (QID) | ORAL | Status: DC | PRN
  Administered 2015-12-10 – 2015-12-11 (×2): 650 mg via ORAL
  Filled 2015-12-09 (×2): qty 2

## 2015-12-09 MED ORDER — ALBUTEROL SULFATE (2.5 MG/3ML) 0.083% IN NEBU: 2.5000 mg | INHALATION_SOLUTION | RESPIRATORY_TRACT | Status: DC | PRN

## 2015-12-09 MED ORDER — ENOXAPARIN SODIUM 40 MG/0.4ML ~~LOC~~ SOLN: 40.0000 mg | SUBCUTANEOUS | Status: DC

## 2015-12-09 MED ORDER — ONDANSETRON HCL 4 MG/2ML IJ SOLN: 4.0000 mg | Freq: Four times a day (QID) | INTRAMUSCULAR | Status: DC | PRN

## 2015-12-09 NOTE — ED Notes (Signed)
Attempted report 

## 2015-12-09 NOTE — ED Notes (Signed)
Pt with difficulty swallowing large potassium pills and coughing. Pt made NPO and swallow eval ordered per protocol.

## 2015-12-09 NOTE — H&P (Signed)
[facility] History and Physical  [name] [id number] DOB: 02-02-1934 DOA: 12/09/2015  Referring physician: Quincy Carnes PA PCP: Cari Caraway, MD   Chief Complaint: acute encephalopathy/generalized weakness/falls  HPI: [name] is a 82 y.o. female with a past medical history that includes CAD, A. fib on Eliquis, COPD on oxygen chronic pain remote ovarian cancer presents to the emergency department with a chief complaint of persistent worsening generalized weakness/confusion with for falls. Initial evaluation reveals hypercalcemia hypokalemia.  Information is obtained from the sister who is at the bedside as information from patient unreliable do to acute encephalopathy. Sister reports over the last 7 days patient growing gradually weaker and weaker and more confused. The sister reports 4 falls over the last 7 days 2 of which she hit her head but never lost consciousness. Sister reports "her legs just give out". Associated symptoms include decreased oral intake worsening of baseline dysphagia as well as slow to answer questions and having difficulty following commands. No complaints of chest pain palpitations dizziness. No recent shortness of breath, coughing fever chills nausea vomiting. No diarrhea constipation melena or bright red blood per rectum.  Emergency department she's afebrile hemodynamically stable and not hypoxic.  He is provided with IV fluids and IV Lasix IV potassium   Review of Systems:  10 point review of systems complete and all systems are negative except as indicated in the history of present illness Past Medical History  Diagnosis Date  . CAD (coronary artery disease)   . CHF (congestive heart failure) ([place])   . Paroxysmal atrial fibrillation (HCC)   . Bradycardia   . Hypertension   . Renal artery stenosis ([place])   . COPD (chronic obstructive pulmonary disease) ([place])   . Obesity hypoventilation syndrome ([place])   . Pruritus   .  Tracheobronchitis   . Acute rhinosinusitis   . Anemia   . Hypoxemia   . Peripheral edema   . Obesity, endogenous   . Diabetes mellitus   . Acid reflux disease   . Osteoarthritis, generalized   . Hypothyroidism   . Lung nodule     lingula  . OSA (obstructive sleep apnea)     NPSG 04/03/00--AHI 28.hr  . DVT (deep venous thrombosis) ([place]) 2003    left leg  . Vaginal atrophy   . Hx of colonic polyps   . Acute diastolic heart failure ([place])   . Secondary pulmonary hypertension ([place]) 06/09/2013  . Blood transfusion without reported diagnosis 2009    had internal bleeding  . Cancer ([place]) 1959    ovarian cancer  . Osteoporosis   . Stroke ([place])   . Chronic pain    Past Surgical History  Procedure Laterality Date  . Lumbar spine surgery    . Cholecystectomy    . Hemorroidectomy    . Repair wound fistula    . Right shoulder    . Elbow surgery    . Dilation and curettage of uterus    . Breast surgery  1998    lumpectomy  . Oophorectomy  1959    BSO-? ovarian cancer  . Bilateral salpingoophorectomy  1959    -d/t ovarian cancer  . Rotator cuff repair Right   . Cardiac catheterization N/A 03/21/2015    Procedure: Right Heart Cath;  Surgeon: Jolaine Artist, MD;  Location: [facility];  Service: Cardiovascular;  Laterality: N/A;   Social History:  reports that she quit smoking about 37 years ago. She has never used smokeless tobacco.  She reports that she does not drink alcohol or use illicit drugs. Patient lives at home with her husband she uses a walker for ambulation minimal assist with ADLs Allergies  Allergen Reactions  . Cefuroxime Axetil Shortness Of Breath, Swelling and Rash    Throat swelling  . Cephalexin Shortness Of Breath, Swelling and Rash    Throat swelling  . Clarithromycin Shortness Of Breath, Swelling and Rash    Throat swelling  . Doxycycline Shortness Of Breath, Swelling and Rash     rash on legs and feet, throat swelling  . Erythromycin Shortness  Of Breath, Swelling and Rash    Throat swelling  . Tetracycline Shortness Of Breath, Swelling and Rash    Throat swelling  . Bactrim [Sulfamethoxazole-Trimethoprim] Hives    Family History  Problem Relation Age of Onset  . Pneumonia Mother   . Heart disease Mother   . Allergies Mother   . Emphysema Mother   . Arthritis Mother   . Hypertension Mother   . Heart attack Father   . Hypertension Father   . Diabetes Sister   . Heart disease Sister   . Diabetes Brother   . Diabetes      grandfather  . Breast cancer      aunt  . Bone cancer      aunt  . Clotting disorder Brother   . Arthritis Sister   . Breast cancer Maternal Aunt     Prior to Admission medications   Medication Sig Start Date End Date Taking? Authorizing Provider  acetaminophen (TYLENOL) 500 MG tablet Take 1,000 mg by mouth every 6 (six) hours as needed for mild pain.    Yes Historical Provider, MD  albuterol (PROVENTIL HFA;VENTOLIN HFA) 108 (90 BASE) MCG/ACT inhaler Inhale 1 puff into the lungs every 4 (four) hours as needed for wheezing or shortness of breath.   Yes Historical Provider, MD  albuterol (PROVENTIL) (2.5 MG/3ML) 0.083% nebulizer solution Take 3 mLs (2.5 mg total) by nebulization every 2 (two) hours as needed for wheezing or shortness of breath. 03/25/15  Yes Shanker Kristeen Mans, MD  allopurinol (ZYLOPRIM) 100 MG tablet Take 1 tablet (100 mg total) by mouth daily. Patient taking differently: Take 100 mg by mouth 2 (two) times daily.  07/21/13  Yes Samella Parr, NP  amLODipine (NORVASC) 2.5 MG tablet Take 1 tablet (2.5 mg total) by mouth daily. 05/17/15  Yes Jolaine Artist, MD  apixaban (ELIQUIS) 2.5 MG TABS tablet Take 2.5 mg by mouth 2 (two) times daily.   Yes Historical Provider, MD  azelastine (ASTELIN) 137 MCG/SPRAY nasal spray 1-2 sprays each nostril once or twice daily 11/05/13  Yes Clinton D Young, MD  bisacodyl (DULCOLAX) 5 MG EC tablet Take 5 mg by mouth daily as needed for moderate  constipation.   Yes Historical Provider, MD  budesonide (PULMICORT) 0.25 MG/2ML nebulizer solution Take 2 mLs (0.25 mg total) by nebulization daily. 05/13/14  Yes Deneise Lever, MD  calcitRIOL (ROCALTROL) 0.25 MCG capsule Take 0.25 mcg by mouth daily.  06/06/15  Yes Historical Provider, MD  colchicine 0.6 MG tablet Take 1.2 mg by mouth daily as needed (for gout flareups).    Yes Historical Provider, MD  CRESTOR 20 MG tablet Take 20 mg by mouth daily.  06/10/15  Yes Historical Provider, MD  diltiazem (CARDIZEM CD) 180 MG 24 hr capsule Take 180 mg by mouth daily.   Yes Historical Provider, MD  diphenhydrAMINE (BENADRYL) 25 MG tablet Take 25 mg by  mouth at bedtime as needed for sleep.   Yes Historical Provider, MD  furosemide (LASIX) 20 MG tablet Take 3 tablets (60 mg total) by mouth 2 (two) times daily. 07/19/15  Yes Amy D Clegg, NP  guaiFENesin (MUCINEX) 600 MG 12 hr tablet Take 600 mg by mouth daily.   Yes Historical Provider, MD  insulin glargine (LANTUS) 100 UNIT/ML injection Inject 20-35 Units into the skin 2 (two) times daily. Sliding scale   Yes Historical Provider, MD  isosorbide mononitrate (IMDUR) 30 MG 24 hr tablet Take 30 mg by mouth daily.   Yes Historical Provider, MD  levothyroxine (SYNTHROID, LEVOTHROID) 25 MCG tablet Take 25 mcg by mouth See admin instructions. Take 1 tablet (25 mcg) on MON, WED, FRI, SUN   Yes Historical Provider, MD  levothyroxine (SYNTHROID, LEVOTHROID) 50 MCG tablet Take 50 mcg by mouth See admin instructions. Take 50mg  on Tues, Thurs, Sat   Yes Historical Provider, MD  methocarbamol (ROBAXIN) 500 MG tablet Take 500 mg by mouth at bedtime.   Yes Historical Provider, MD  metolazone (ZAROXOLYN) 2.5 MG tablet Take 1 tablet (2.5 mg total) by mouth as needed. 10/31/15  Yes Jolaine Artist, MD  metoprolol (LOPRESSOR) 50 MG tablet Take 50 mg by mouth 2 (two) times daily. Reported on 09/19/2015   Yes Historical Provider, MD  ondansetron (ZOFRAN) 4 MG tablet Take 1 tablet  (4 mg total) by mouth every 8 (eight) hours as needed for nausea or vomiting. 07/19/15  Yes Amy D Clegg, NP  oxybutynin (DITROPAN XL) 10 MG 24 hr tablet Take 10 mg by mouth daily.    Yes Historical Provider, MD  oxybutynin (DITROPAN-XL) 10 MG 24 hr tablet Take 10 mg by mouth at bedtime.   Yes Historical Provider, MD  traMADol (ULTRAM) 50 MG tablet Take 50 mg by mouth every 6 (six) hours as needed for moderate pain.    Yes Historical Provider, MD  insulin lispro (HUMALOG) 100 UNIT/ML injection Inject 3 Units into the skin 2 (two) times daily with breakfast and lunch. Per sliding scale. Patient unsure about how much she really takes    Historical Provider, MD  nitroGLYCERIN (NITROSTAT) 0.4 MG SL tablet Place 0.4 mg under the tongue every 5 (five) minutes as needed for chest pain.     Historical Provider, MD  potassium chloride 20 MEQ TBCR Take 10 mEq by mouth daily. 09/22/15   Larey Dresser, MD   Physical Exam: Filed Vitals:   12/09/15 0630 12/09/15 0645 12/09/15 0700 12/09/15 0845  BP: 157/72 145/76 155/87 152/57  Pulse: 47 44 67 42  Temp:      TempSrc:      Resp: 14 19 20 20   SpO2: 99% 94% 97% 96%    Wt Readings from Last 3 Encounters:  09/25/15 72.576 kg (160 lb)  09/19/15 70.875 kg (156 lb 4 oz)  08/26/15 74.934 kg (165 lb 3.2 oz)    General:  Appears Lethargic calm and comfortable Eyes: PERRL, normal lids, irises & conjunctiva ENT: grossly normal hearing, lips & tongue, because membranes of her mouth are pink but dry Neck: no LAD, masses or thyromegaly Cardiovascular: Irregularly irregular, no m/r/g. No LE edema.  Respiratory: CTA bilaterally somewhat distant, no w/r/r. Normal respiratory effort. Abdomen: soft, ntnd positive bowel sounds throughout no guarding or rebounding Skin: no rash or induration seen on limited exam warm and dry Musculoskeletal: grossly normal tone BUE/BLE joints without swelling/erythema Psychiatric: grossly normal mood and affect, speech fluent and  appropriate Neurologic:  grossly non-focal. Moves extremities spontaneously. She is somewhat. Speech slow and slightly slurred. Demonstrated difficulty swallowing liquid. Difficulty following simple commands. Oriented to self and place only. Bilateral grip 4/5           Labs on Admission:  Basic Metabolic Panel:  Recent Labs Lab 12/09/15 0742 12/09/15 0922  NA 142  --   K 2.7*  --   CL 99*  --   CO2 31  --   GLUCOSE 123*  --   BUN 39*  --   CREATININE 1.79*  --   CALCIUM >15.0*  --   MG  --  1.8   Liver Function Tests: No results for input(s): AST, ALT, ALKPHOS, BILITOT, PROT, ALBUMIN in the last 168 hours. No results for input(s): LIPASE, AMYLASE in the last 168 hours. No results for input(s): AMMONIA in the last 168 hours. CBC:  Recent Labs Lab 12/09/15 0616  WBC 12.4*  NEUTROABS 10.2*  HGB 14.0  HCT 42.6  MCV 99.5  PLT 169   Cardiac Enzymes: No results for input(s): CKTOTAL, CKMB, CKMBINDEX, TROPONINI in the last 168 hours.  BNP (last 3 results)  Recent Labs  03/28/15 2018 04/19/15 1214 05/17/15 1115  BNP 1496.2* 1411.2* 1045.0*    ProBNP (last 3 results)  Recent Labs  04/11/15 1026  PROBNP 1411.0*    CBG: No results for input(s): GLUCAP in the last 168 hours.  Radiological Exams on Admission: Dg Chest 2 View  12/09/2015  CLINICAL DATA:  Patient's sister fell on her at church, Pain right hip EXAM: CHEST  2 VIEW COMPARISON:  07/01/2015 FINDINGS: Cardiomegaly. Coronary artery calcifications visualize. Mild vascular congestion. No confluent opacities, effusions or overt edema. Prior right shoulder replacement. No acute bony abnormality. IMPRESSION: Cardiomegaly with vascular congestion. Coronary artery disease. Electronically Signed   By: Rolm Baptise M.D.   On: 12/09/2015 08:13   Ct Head Wo Contrast  12/09/2015  CLINICAL DATA:  Recent falls EXAM: CT HEAD WITHOUT CONTRAST TECHNIQUE: Contiguous axial images were obtained from the base of the skull  through the vertex without intravenous contrast. COMPARISON:  09/25/2015 FINDINGS: The examination is somewhat limited by patient motion artifact. Bony calvarium is intact. Mild atrophic changes and mild chronic white matter ischemic changes are seen. No findings to suggest acute hemorrhage, acute infarction or space-occupying mass lesion are noted. Basal ganglia calcifications are again seen bilaterally. IMPRESSION: Mild atrophic and ischemic changes.  No acute abnormality noted. Electronically Signed   By: Inez Catalina M.D.   On: 12/09/2015 08:32   Dg Hip Unilat With Pelvis 2-3 Views Right  12/09/2015  CLINICAL DATA:  Hip pain.  Fall EXAM: DG HIP (WITH OR WITHOUT PELVIS) 2-3V RIGHT COMPARISON:  None. FINDINGS: Advanced degenerative changes in the hips bilaterally with joint space loss and spurring. Subchondral sclerosis and cyst formation. SI joints are symmetric and unremarkable. No acute bony abnormality. Specifically, no fracture, subluxation, or dislocation. Soft tissues are intact. IMPRESSION: Advanced degenerative changes in the hips bilaterally. No acute bony abnormality. Electronically Signed   By: Rolm Baptise M.D.   On: 12/09/2015 08:15    EKG: Independently reviewed. Atrial fibrillation Paired ventricular premature complexes Incomplete right bundle branch block  Assessment/Plan Principal Problem:   Acute encephalopathy Active Problems:   Hypothyroidism   Diabetes type 2, uncontrolled (HCC)   Essential hypertension   Atrial fibrillation (HCC)   COPD (chronic obstructive pulmonary disease) (HCC)   Chronic diastolic heart failure (HCC)   CKD (chronic kidney disease), stage IV (  [place])   Hypercalcemia   Dysphagia  #1. Acute encephalopathy/generalized weakness/falls. Etiology likely related to hypercalcemia in the setting of chronic kidney disease and hypokalemia and calcium supplements. Calcium level on admission greater 15. Gradual onset and worsening over last 7 days. CT of the head  doubt acute abnormality. No indications of infectious process. No history of ovarian cancer. Some question of lung nodule on imaging in past -Admit to telemetry  -Obtain PTH, TSH, free T4 and magnesium level  -cortisol level, phosphorus  -Obtain albumin level -SPEP, UPEP -LFT -Continue IV fluids  -hold home calcitrol - discuss with heme Pamidronate in CKD -PT evaluation tomorrow  #2. Hypokalemia. Potassium level 2.7 on admission. Home medications include Lasix and spironolactone as well as potassium supplements  -IV supplements  -Obtain magnesium level  -Recheck in the morning   #3. A. fib. Controlled. She is on Eliquis. Chadvasc score 6. Home medications include diltiazem, BB -We'll continue beta blocker intravenously with parameters  -Plan to resume home medications wants swallow eval past   #4. Dysphagia. Patient with history of same since stroke last year but not on dysphagia diet or thick in her. Symptoms worse with acute illness  -Nothing by mouth until passes swallow eval   #5. Chronic kidney disease. Stage IV. 71.79 on admission. Chart review indicates this is baseline.  -Hold nephrotoxins  -Monitor urine output  -Gentle IV fluids   #6 chronic diastolic heart failure. Currently compensated. Echo done in August 2016 with an EF of 60%. Home medications include Lasix and spironolactone as well as a beta blocker  -Holding spironolactone  -Continue Lasix  -Intake and output  -Daily weights   7. COPD gold 2. Appears stable at baseline On home oxygen. Oxygen supplementation greater than 90% on 2 L -Continue oxygen supplementation  -Continue home inhalers  -Monitor   8. hypertension. Fair control in the emergency. Medications include metoprolol, Lasix, spironolactone, Norvasc  -Low-dose metoprolol intravenously while nothing by mouth with parameters     Code Status: full DVT Prophylaxis: Family Communication: sister at bedside Disposition Plan: to be determined  Time  spent: 89 minutes  [facility]

## 2015-12-09 NOTE — Progress Notes (Signed)
Patient arrived to unit with sister n law. Patient and visitor oriented to the room patient placed on telemetry box,  Skin intact bed alarm set.  Admission nurse called and when arrived family was no longer present.

## 2015-12-09 NOTE — ED Provider Notes (Signed)
CSN: [id number]     Arrival date & time 12/09/15  0516 History   First MD Initiated Contact with Patient 12/09/15 0602     Chief Complaint  Patient presents with  . Fall     (Consider location/radiation/quality/duration/timing/severity/associated sxs/prior Treatment) Patient is a 82 y.o. female presenting with fall. The history is provided by the patient and medical records.  Fall Associated symptoms include arthralgias and weakness.   82 year old female with history of coronary artery disease, congestive heart failure, paroxysmal A. fib on Eliquis, chronic hypoxia on supplemental O2, arthritis, presenting to the ED after fall x2.  Patient fell 2 weeks ago at church, did have head injury at that time but no LOC.  Since this time she has had persistent weakness.  States now it is to the point where she is barely able to stand.  She fell again last night while trying to get out of bed to use the restroom.  She apparently fell onto her right side, questionable head injury but denies LOC.  Patient states she continues to feel weak all over.  States she has been eating and drinking normally, she threw up a few days ago but states this seems to have resolved.  Denies focal weakness, numbness, dizziness, increased confusion, visual disturbance.  Family reports the last time she was weak like this she had a UTI which was several months ago.  No recent fever, chills, sweats.  VSS.  Past Medical History  Diagnosis Date  . CAD (coronary artery disease)   . CHF (congestive heart failure) ([place])   . Paroxysmal atrial fibrillation (H[facility])   . Bradycardia   . Hypertension   . Renal artery stenosis ([place])   . COPD (chronic obstructive pulmonary disease) ([place])   . Obesity hypoventilation syndrome ([place])   . Pruritus   . Tracheobronchitis   . Acute rhinosinusitis   . Anemia   . Hypoxemia   . Peripheral edema   . Obesity, endogenous   . Diabetes mellitus   . Acid reflux disease   . Osteoarthritis,  generalized   . Hypothyroidism   . Lung nodule     lingula  . OSA (obstructive sleep apnea)     NPSG 04/03/00--AHI 28.hr  . DVT (deep venous thrombosis) ([place]) 2003    left leg  . Vaginal atrophy   . Hx of colonic polyps   . Acute diastolic heart failure ([place])   . Secondary pulmonary hypertension ([place]) 06/09/2013  . Blood transfusion without reported diagnosis 2009    had internal bleeding  . Cancer ([place]) 1959    ovarian cancer  . Osteoporosis   . Stroke [facility])    Past Surgical History  Procedure Laterality Date  . Lumbar spine surgery    . Cholecystectomy    . Hemorroidectomy    . Repair wound fistula    . Right shoulder    . Elbow surgery    . Dilation and curettage of uterus    . Breast surgery  1998    lumpectomy  . Oophorectomy  1959    BSO-? ovarian cancer  . Bilateral salpingoophorectomy  1959    -d/t ovarian cancer  . Rotator cuff repair Right   . Cardiac catheterization N/A 03/21/2015    Procedure: Right Heart Cath;  Surgeon: Jolaine Artist, MD;  Location: [facility];  Service: Cardiovascular;  Laterality: N/A;   Family History  Problem Relation Age of Onset  . Pneumonia Mother   . Heart disease Mother   .  Allergies Mother   . Emphysema Mother   . Arthritis Mother   . Hypertension Mother   . Heart attack Father   . Hypertension Father   . Diabetes Sister   . Heart disease Sister   . Diabetes Brother   . Diabetes      grandfather  . Breast cancer      aunt  . Bone cancer      aunt  . Clotting disorder Brother   . Arthritis Sister   . Breast cancer Maternal Aunt    Social History  Substance Use Topics  . Smoking status: Former Smoker    Quit date: 08/13/1978  . Smokeless tobacco: Never Used  . Alcohol Use: No   OB History    Gravida Para Term Preterm AB TAB SAB Ectopic Multiple Living   0               Obstetric Comments   2 adopted children     Review of Systems  Musculoskeletal: Positive for arthralgias.  Neurological:  Positive for weakness.  All other systems reviewed and are negative.     Allergies  Cefuroxime axetil; Cephalexin; Clarithromycin; Doxycycline; Erythromycin; Tetracycline; and Bactrim  Home Medications   Prior to Admission medications   Medication Sig Start Date End Date Taking? Authorizing Provider  acetaminophen (TYLENOL) 500 MG tablet Take 1,000 mg by mouth every 6 (six) hours as needed for mild pain.     Historical Provider, MD  albuterol (PROVENTIL HFA;VENTOLIN HFA) 108 (90 BASE) MCG/ACT inhaler Inhale 1 puff into the lungs every 4 (four) hours as needed for wheezing or shortness of breath.    Historical Provider, MD  albuterol (PROVENTIL) (2.5 MG/3ML) 0.083% nebulizer solution Take 3 mLs (2.5 mg total) by nebulization every 2 (two) hours as needed for wheezing or shortness of breath. 03/25/15   Shanker Kristeen Mans, MD  allopurinol (ZYLOPRIM) 100 MG tablet Take 1 tablet (100 mg total) by mouth daily. 07/21/13   Samella Parr, NP  amLODipine (NORVASC) 2.5 MG tablet Take 1 tablet (2.5 mg total) by mouth daily. 05/17/15   Jolaine Artist, MD  apixaban (ELIQUIS) 2.5 MG TABS tablet Take 2.5 mg by mouth 2 (two) times daily.    Historical Provider, MD  azelastine (ASTELIN) 137 MCG/SPRAY nasal spray 1-2 sprays each nostril once or twice daily 11/05/13   Deneise Lever, MD  bisacodyl (DULCOLAX) 5 MG EC tablet Take 5 mg by mouth daily as needed for moderate constipation.    Historical Provider, MD  budesonide (PULMICORT) 0.25 MG/2ML nebulizer solution Take 2 mLs (0.25 mg total) by nebulization daily. 05/13/14   Deneise Lever, MD  calcitRIOL (ROCALTROL) 0.25 MCG capsule Take 0.25 mcg by mouth daily.  06/06/15   Historical Provider, MD  colchicine 0.6 MG tablet Take 1.2 mg by mouth daily as needed (for gout flareups).     Historical Provider, MD  CRESTOR 20 MG tablet Take 20 mg by mouth daily.  06/10/15   Historical Provider, MD  diltiazem (CARDIZEM CD) 120 MG 24 hr capsule Take 1 capsule (120 mg  total) by mouth daily. 03/25/15   Shanker Kristeen Mans, MD  furosemide (LASIX) 20 MG tablet Take 3 tablets (60 mg total) by mouth 2 (two) times daily. 07/19/15   Amy D Ninfa Meeker, NP  guaiFENesin (MUCINEX) 600 MG 12 hr tablet Take 600 mg by mouth daily.    Historical Provider, MD  insulin glargine (LANTUS) 100 UNIT/ML injection Inject 25-35 Units into the skin  2 (two) times daily. Inject 25 units in the morning and 35 units in the evening    Historical Provider, MD  insulin lispro (HUMALOG) 100 UNIT/ML injection Inject 2 Units into the skin 2 (two) times daily with breakfast and lunch. Per sliding scale. Patient unsure about how much she really takes    Historical Provider, MD  isosorbide mononitrate (IMDUR) 30 MG 24 hr tablet Take 30 mg by mouth daily.    Historical Provider, MD  levothyroxine (SYNTHROID, LEVOTHROID) 25 MCG tablet Take 25 mcg by mouth See admin instructions. Take 1 tablet (25 mcg) on MON, WED, FRI, SUN    Historical Provider, MD  levothyroxine (SYNTHROID, LEVOTHROID) 50 MCG tablet Take 50 mcg by mouth See admin instructions. Take 50mg  on Tues, Thurs, Sat    Historical Provider, MD  methocarbamol (ROBAXIN) 500 MG tablet Take 500 mg by mouth at bedtime.    Historical Provider, MD  metolazone (ZAROXOLYN) 2.5 MG tablet Take 1 tablet (2.5 mg total) by mouth as needed. 10/31/15   Jolaine Artist, MD  metoprolol (LOPRESSOR) 50 MG tablet Take 50 mg by mouth 2 (two) times daily. Reported on 09/19/2015    Historical Provider, MD  nitroGLYCERIN (NITROSTAT) 0.4 MG SL tablet Place 0.4 mg under the tongue every 5 (five) minutes as needed for chest pain.     Historical Provider, MD  ondansetron (ZOFRAN) 4 MG tablet Take 1 tablet (4 mg total) by mouth every 8 (eight) hours as needed for nausea or vomiting. 07/19/15   Amy D Ninfa Meeker, NP  oxybutynin (DITROPAN XL) 10 MG 24 hr tablet Take 10 mg by mouth daily.     Historical Provider, MD  potassium chloride 20 MEQ TBCR Take 10 mEq by mouth daily. 09/22/15   Larey Dresser, MD  traMADol (ULTRAM) 50 MG tablet Take 50 mg by mouth every 6 (six) hours as needed for moderate pain.     Historical Provider, MD   BP 153/64 mmHg  Pulse 88  Temp(Src) 98.5 F (36.9 C) (Oral)  Resp 23  SpO2 99%  LMP 08/13/1957   Physical Exam  Constitutional: She is oriented to person, place, and time. She appears well-developed and well-nourished. No distress.  HENT:  Head: Normocephalic and atraumatic.  Mouth/Throat: Oropharynx is clear and moist.  No visible signs of head trauma Very dry mucous membranes  Eyes: Conjunctivae and EOM are normal. Pupils are equal, round, and reactive to light.  Neck: Normal range of motion.  Cardiovascular: Normal rate and normal heart sounds.  A regularly irregular rhythm present.  AFIB (chronic)  Pulmonary/Chest: Effort normal and breath sounds normal. No respiratory distress. She has no wheezes. She has no rhonchi.  Chronic O2 in use  Abdominal: Soft. Bowel sounds are normal. There is no tenderness. There is no guarding.  Musculoskeletal: Normal range of motion. She exhibits no edema.  Right hip with TTP along anterior and medial aspects; no gross deformity, leg shortening, or rotation noted Old bruises noted to right lower leg  Neurological: She is alert and oriented to person, place, and time.  Skin: Skin is warm and dry. She is not diaphoretic.  Psychiatric: She has a normal mood and affect.  Nursing note and vitals reviewed.   ED Course  Procedures (including critical care time) Labs Review Labs Reviewed  CBC WITH DIFFERENTIAL/PLATELET - Abnormal; Notable for the following:    WBC 12.4 (*)    Neutro Abs 10.2 (*)    All other components within normal limits  URINALYSIS, ROUTINE W REFLEX MICROSCOPIC (NOT AT [facility]) - Abnormal; Notable for the following:    APPearance CLOUDY (*)    Hgb urine dipstick SMALL (*)    Protein, ur 100 (*)    Leukocytes, UA MODERATE (*)    All other components within normal limits  URINE  MICROSCOPIC-ADD ON - Abnormal; Notable for the following:    Squamous Epithelial / LPF 0-5 (*)    Bacteria, UA RARE (*)    All other components within normal limits  BASIC METABOLIC PANEL - Abnormal; Notable for the following:    Potassium 2.7 (*)    Chloride 99 (*)    Glucose, Bld 123 (*)    BUN 39 (*)    Creatinine, Ser 1.79 (*)    Calcium >15.0 (*)    GFR calc non Af Amer 26 (*)    GFR calc Af Amer 30 (*)    All other components within normal limits  CBG MONITORING, ED - Abnormal; Notable for the following:    Glucose-Capillary 106 (*)    All other components within normal limits  URINE CULTURE  MAGNESIUM  TSH  T4, FREE  PTH, INTACT AND CALCIUM  FOLATE RBC  VITAMIN B12  RPR  VITAMIN D 25 HYDROXY (VIT D DEFICIENCY, FRACTURES)  PROTEIN ELECTROPHORESIS, SERUM  IMMUNOFIXATION ELECTROPHORESIS  PHOSPHORUS  HEPATIC FUNCTION PANEL  CORTISOL  I-STAT TROPOININ, ED    Imaging Review Dg Chest 2 View  12/09/2015  CLINICAL DATA:  Patient's sister fell on her at church, Pain right hip EXAM: CHEST  2 VIEW COMPARISON:  07/01/2015 FINDINGS: Cardiomegaly. Coronary artery calcifications visualize. Mild vascular congestion. No confluent opacities, effusions or overt edema. Prior right shoulder replacement. No acute bony abnormality. IMPRESSION: Cardiomegaly with vascular congestion. Coronary artery disease. Electronically Signed   By: Rolm Baptise M.D.   On: 12/09/2015 08:13   Ct Head Wo Contrast  12/09/2015  CLINICAL DATA:  Recent falls EXAM: CT HEAD WITHOUT CONTRAST TECHNIQUE: Contiguous axial images were obtained from the base of the skull through the vertex without intravenous contrast. COMPARISON:  09/25/2015 FINDINGS: The examination is somewhat limited by patient motion artifact. Bony calvarium is intact. Mild atrophic changes and mild chronic white matter ischemic changes are seen. No findings to suggest acute hemorrhage, acute infarction or space-occupying mass lesion are noted. Basal  ganglia calcifications are again seen bilaterally. IMPRESSION: Mild atrophic and ischemic changes.  No acute abnormality noted. Electronically Signed   By: Inez Catalina M.D.   On: 12/09/2015 08:32   Dg Hip Unilat With Pelvis 2-3 Views Right  12/09/2015  CLINICAL DATA:  Hip pain.  Fall EXAM: DG HIP (WITH OR WITHOUT PELVIS) 2-3V RIGHT COMPARISON:  None. FINDINGS: Advanced degenerative changes in the hips bilaterally with joint space loss and spurring. Subchondral sclerosis and cyst formation. SI joints are symmetric and unremarkable. No acute bony abnormality. Specifically, no fracture, subluxation, or dislocation. Soft tissues are intact. IMPRESSION: Advanced degenerative changes in the hips bilaterally. No acute bony abnormality. Electronically Signed   By: Rolm Baptise M.D.   On: 12/09/2015 08:15   I have personally reviewed and evaluated these images and lab results as part of my medical decision-making.   EKG Interpretation   Date/Time:  Friday December 09 2015 05:46:19 EDT Ventricular Rate:  98 PR Interval:    QRS Duration: 115 QT Interval:  348 QTC Calculation: 444 R Axis:   83 Text Interpretation:  Atrial fibrillation Paired ventricular premature  complexes Incomplete right bundle branch block  Confirmed by [facility]   MD, APRIL ([id number]) on 12/09/2015 7:42:41 AM      MDM   Final diagnoses:  Hypercalcemia  Hypokalemia  Weakness  CKD (chronic kidney disease), stage IV ([place])   82 y.o. F with fall x2 and generalized weakness for 2 week.  Patient afebrile, non-toxic.  On her baseline supplemental O2.  Does have head injury, no LOC reported.  Also complains of right hip pain.  No deformities noted on exam.  Will send labs and obtain CT head, CXR, right hip films.  EKG with AFIB, hx of same on eliquis.  Patient given some IVF as she appears dry on exam.  Calcium is greater than 15. Hypokalemia also noted a 2.7. Question of parathyroid issue vs oncologic process vs other etiology.  Patient does have questionable history of ovarian cancer and some report of lung nodule on prior imaging studies.  Will add magnesium, TSH, free T4, and PTH. We'll continue IV fluids. Patient given K+ supplementation as well as dose of diuretics.  Will admit to telemetry floor for further management.    Larene Pickett, PA-C 12/09/15 1206  Veatrice Kells, MD 12/11/15 2259  Veatrice Kells, MD 12/11/15 [phone]

## 2015-12-09 NOTE — ED Notes (Signed)
Pt arrives by [facility] post fall. Pt fell 2 weeks ago at church as well. C/O weakness for two weeks. Pt denies hitting head and is on Eliquis. Pt has IV in right AC, received 241mL bolus and 150 mcg Fentanyl. Pt c/o right hip pain, no shortening or rotation. Pt has baseline dysphagia from prior stroke. CGB 160, last BP 130/70. Pt wears 4L O2 at home and was at 96% on 4L for EMS.  Family states pt was this way last time she had a UTI.

## 2015-12-10 DIAGNOSIS — G9340 Encephalopathy, unspecified: Secondary | ICD-10-CM

## 2015-12-10 LAB — URINE CULTURE

## 2015-12-10 LAB — COMPREHENSIVE METABOLIC PANEL
ALK PHOS: 46 U/L (ref 38–126)
ALT: 23 U/L (ref 14–54)
AST: 37 U/L (ref 15–41)
Albumin: 3.6 g/dL (ref 3.5–5.0)
Anion gap: 11 (ref 5–15)
BILIRUBIN TOTAL: 1.3 mg/dL — AB (ref 0.3–1.2)
BUN: 25 mg/dL — ABNORMAL HIGH (ref 6–20)
CALCIUM: 12.8 mg/dL — AB (ref 8.9–10.3)
CO2: 27 mmol/L (ref 22–32)
CREATININE: 1.38 mg/dL — AB (ref 0.44–1.00)
Chloride: 109 mmol/L (ref 101–111)
GFR, EST AFRICAN AMERICAN: 41 mL/min — AB (ref >60)
GFR, EST NON AFRICAN AMERICAN: 35 mL/min — AB (ref >60)
Glucose, Bld: 134 mg/dL — ABNORMAL HIGH (ref 65–99)
Potassium: 3.6 mmol/L (ref 3.5–5.1)
Sodium: 147 mmol/L — ABNORMAL HIGH (ref 135–145)
Total Protein: 6.7 g/dL (ref 6.5–8.1)

## 2015-12-10 LAB — BASIC METABOLIC PANEL
ANION GAP: 9 (ref 5–15)
BUN: 24 mg/dL — ABNORMAL HIGH (ref 6–20)
CO2: 28 mmol/L (ref 22–32)
Calcium: 11.4 mg/dL — ABNORMAL HIGH (ref 8.9–10.3)
Chloride: 108 mmol/L (ref 101–111)
Creatinine, Ser: 1.12 mg/dL — ABNORMAL HIGH (ref 0.44–1.00)
GFR calc Af Amer: 53 mL/min — ABNORMAL LOW (ref >60)
GFR calc non Af Amer: 45 mL/min — ABNORMAL LOW (ref >60)
GLUCOSE: 175 mg/dL — AB (ref 65–99)
POTASSIUM: 3 mmol/L — AB (ref 3.5–5.1)
Sodium: 145 mmol/L (ref 135–145)

## 2015-12-10 LAB — PTH, INTACT AND CALCIUM
Calcium, Total (PTH): 15.2 mg/dL (ref 8.7–10.3)
PTH: 14 pg/mL — AB (ref 15–65)

## 2015-12-10 LAB — VITAMIN D 25 HYDROXY (VIT D DEFICIENCY, FRACTURES): Vit D, 25-Hydroxy: 41.8 ng/mL (ref 30.0–100.0)

## 2015-12-10 LAB — CBC
HCT: 42.2 % (ref 36.0–46.0)
Hemoglobin: 13.4 g/dL (ref 12.0–15.0)
MCH: 32.2 pg (ref 26.0–34.0)
MCHC: 31.8 g/dL (ref 30.0–36.0)
MCV: 101.4 fL — ABNORMAL HIGH (ref 78.0–100.0)
PLATELETS: 144 10*3/uL — AB (ref 150–400)
RBC: 4.16 MIL/uL (ref 3.87–5.11)
RDW: 15.5 % (ref 11.5–15.5)
WBC: 11.7 10*3/uL — AB (ref 4.0–10.5)

## 2015-12-10 LAB — GLUCOSE, CAPILLARY
Glucose-Capillary: 118 mg/dL — ABNORMAL HIGH (ref 65–99)
Glucose-Capillary: 125 mg/dL — ABNORMAL HIGH (ref 65–99)
Glucose-Capillary: 152 mg/dL — ABNORMAL HIGH (ref 65–99)
Glucose-Capillary: 103 mg/dL — ABNORMAL HIGH (ref 65–99)

## 2015-12-10 LAB — RPR: RPR: NONREACTIVE

## 2015-12-10 NOTE — Evaluation (Signed)
Clinical/Bedside Swallow Evaluation Patient Details  Name: [name] MRN: [id number] Date of Birth: [birth year]  Today's Date: 12/10/2015 Time: SLP Start Time (ACUTE ONLY): 1202 SLP Stop Time (ACUTE ONLY): 1220 SLP Time Calculation (min) (ACUTE ONLY): 18 min  Past Medical History:  Past Medical History  Diagnosis Date  . CAD (coronary artery disease)   . CHF (congestive heart failure) ([place])   . Paroxysmal atrial fibrillation (HCC)   . Bradycardia   . Hypertension   . Renal artery stenosis ([place])   . COPD (chronic obstructive pulmonary disease) ([place])   . Obesity hypoventilation syndrome ([place])   . Pruritus   . Tracheobronchitis   . Acute rhinosinusitis   . Anemia   . Hypoxemia   . Peripheral edema   . Obesity, endogenous   . Diabetes mellitus   . Acid reflux disease   . Osteoarthritis, generalized   . Hypothyroidism   . Lung nodule     lingula  . OSA (obstructive sleep apnea)     NPSG 04/03/00--AHI 28.hr  . DVT (deep venous thrombosis) ([place]) 2003    left leg  . Vaginal atrophy   . Hx of colonic polyps   . Acute diastolic heart failure ([place])   . Secondary pulmonary hypertension ([place]) 06/09/2013  . Blood transfusion without reported diagnosis 2009    had internal bleeding  . Cancer ([place]) 1959    ovarian cancer  . Osteoporosis   . Stroke ([place])   . Chronic pain    Past Surgical History:  Past Surgical History  Procedure Laterality Date  . Lumbar spine surgery    . Cholecystectomy    . Hemorroidectomy    . Repair wound fistula    . Right shoulder    . Elbow surgery    . Dilation and curettage of uterus    . Breast surgery  1998    lumpectomy  . Oophorectomy  1959    BSO-? ovarian cancer  . Bilateral salpingoophorectomy  1959    -d/t ovarian cancer  . Rotator cuff repair Right   . Cardiac catheterization N/A 03/21/2015    Procedure: Right Heart Cath;  Surgeon: Jolaine Artist, MD;  Location: [facility];  Service: Cardiovascular;  Laterality:  N/A;   HPI:  [name] is a 82 y.o. female with a past medical history that includes CAD, A. fib on Eliquis, COPD on oxygen chronic pain remote ovarian cancer presents to the emergency department with a chief complaint of persistent worsening generalized weakness/confusion with for falls. Initial evaluation reveals hypercalcemia hypokalemia.   Assessment / Plan / Recommendation Clinical Impression  Pt does not present with dysphagia. She does however have xerostomia. Presentation of liquids helped, but will benefit from Biotene. Pt had no s/s of aspiration with all consistencies tested. Recommend patient to resume a regular diet with thin liquids. If patient unable to swallow potassium pill please consider alternative administration (liquid or IV). No follow up services are recommended.     Aspiration Risk  No limitations    Diet Recommendation Regular;Thin liquid   Liquid Administration via: Cup;Straw Medication Administration: Whole meds with liquid Supervision: Staff to assist with self feeding Compensations: Slow rate;Small sips/bites Postural Changes: Seated upright at 90 degrees;Remain upright for at least 30 minutes after po intake    Other  Recommendations Oral Care Recommendations: Oral care BID   Follow up Recommendations  None    Frequency and Duration  Prognosis Prognosis for Safe Diet Advancement: Good      Swallow Study   General Date of Onset: 12/09/15 HPI: [name] is a 82 y.o. female with a past medical history that includes CAD, A. fib on Eliquis, COPD on oxygen chronic pain remote ovarian cancer presents to the emergency department with a chief complaint of persistent worsening generalized weakness/confusion with for falls. Initial evaluation reveals hypercalcemia hypokalemia. Type of Study: Bedside Swallow Evaluation Diet Prior to this Study: Thin liquids Temperature Spikes Noted: No Respiratory Status: Nasal  cannula History of Recent Intubation: No Behavior/Cognition: Alert;Cooperative;Pleasant mood Oral Cavity Assessment: Within Functional Limits Oral Care Completed by SLP: No Oral Cavity - Dentition: Dentures, top;Dentures, bottom Vision: Functional for self-feeding Self-Feeding Abilities: Able to feed self Patient Positioning: Upright in chair Baseline Vocal Quality: Normal Volitional Cough: Strong Volitional Swallow: Able to elicit    Oral/Motor/Sensory Function Overall Oral Motor/Sensory Function: Within functional limits   Ice Chips Ice chips: Within functional limits Presentation: Cup;Spoon   Thin Liquid Thin Liquid: Within functional limits Presentation: Cup;Straw    Nectar Thick     Honey Thick     Puree Puree: Within functional limits   Solid   GO   Solid: Within functional limits Presentation: [place], MA, CCC-SLP 12/10/2015 12:23 PM

## 2015-12-10 NOTE — Progress Notes (Signed)
PROGRESS NOTE    [name]  [id number] DOB: [birth year] DOA: 12/09/2015 PCP: Cari Caraway, MD      Brief Narrative: 82 year old female with history of coronary artery disease, congestive heart failure, paroxysmal A. fib on Eliquis, chronic hypoxia on supplemental O2, arthritis, presented to the ED with a history of increasing lethargy, generalized weakness and multiple falls over the last week  Assessment & Plan:   Principal Problem:   Hypercalcemia Active Problems:   Hypothyroidism   Diabetes type 2, uncontrolled (HCC)   Essential hypertension   Atrial fibrillation (HCC)   COPD (chronic obstructive pulmonary disease) (HCC)   Chronic diastolic heart failure (HCC)   CKD (chronic kidney disease), stage IV ([place])   Dysphagia   Acute encephalopathy   Hypercalcemia: Probably from dehydration, improved with IV hydration and IV pamidronate.  Will need further evaluation to detect malignancy after she stabilizes, but will need to talk to family how aggressive they want to work up.  Low PTH.  VIT D levels normal. Normal TSH and free t4.    Hypothyroidism: Resume synthroid.   Diabetes mellitus: Resume SSI.   Atrial fibrillation: Rate controlled.    Chronic diastolic heart failure: Does not appear to be decompensated.    Acute encephalopathy and multiple falls possibly from hypercalcemia and dehydration.  PT eval recommended SNF PLACEMENT.    DVT prophylaxis: (Lovenox) Code Status: (Full) Family Communication: discussed with family at bedside.  Disposition Plan: SNF when calcium normalizes.    Consultants:   none  Procedures: none Antimicrobials: none  Subjective: Confused. No new complaints. No abd pain, chest pain or sob.   Objective: Filed Vitals:   12/10/15 0504 12/10/15 1249 12/10/15 1400 12/10/15 1804  BP: 121/87  144/74 96/71  Pulse: 91  80 77  Temp: 98.3 F (36.8 C)  98 F (36.7 C)   TempSrc: Oral  Oral   Resp: 20  18   Weight:       SpO2: 100% 98% 97% 96%    Intake/Output Summary (Last 24 hours) at 12/10/15 1903 Last data filed at 12/10/15 1300  Gross per 24 hour  Intake    360 ml  Output    500 ml  Net   -140 ml   Filed Weights   12/09/15 1130  Weight: 66.679 kg (147 lb)    Examination:  General exam: Appears calm and comfortable  Respiratory system: Clear to auscultation. Respiratory effort normal. Cardiovascular system: S1 & S2 heard, RRR. No JVD, murmurs, rubs, gallops or clicks. No pedal edema. Gastrointestinal system: Abdomen is nondistended, soft and nontender. No organomegaly or masses felt. Normal bowel sounds heard. Central nervous system: Alert  But confused.  Extremities: Symmetric 5 x 5 power. Skin: No rashes, lesions or ulcers     Data Reviewed: I have personally reviewed following labs and imaging studies  CBC:  Recent Labs Lab 12/09/15 0616 12/10/15 0642  WBC 12.4* 11.7*  NEUTROABS 10.2*  --   HGB 14.0 13.4  HCT 42.6 42.2  MCV 99.5 101.4*  PLT 169 123456*   Basic Metabolic Panel:  Recent Labs Lab 12/09/15 0742 12/09/15 0922 12/09/15 1218 12/10/15 0642  NA 142  --   --  147*  K 2.7*  --   --  3.6  CL 99*  --   --  109  CO2 31  --   --  27  GLUCOSE 123*  --   --  134*  BUN 39*  --   --  25*  CREATININE 1.79*  --   --  1.38*  CALCIUM >15.0* 15.2*  --  12.8*  MG  --  1.8  --   --   PHOS  --   --  2.5  --    GFR: Estimated Creatinine Clearance: 28.9 mL/min (by C-G formula based on Cr of 1.38). Liver Function Tests:  Recent Labs Lab 12/09/15 1218 12/10/15 0642  AST 39 37  ALT 24 23  ALKPHOS 50 46  BILITOT 1.0 1.3*  PROT 7.0 6.7  ALBUMIN 3.9 3.6   No results for input(s): LIPASE, AMYLASE in the last 168 hours. No results for input(s): AMMONIA in the last 168 hours. Coagulation Profile: No results for input(s): INR, PROTIME in the last 168 hours. Cardiac Enzymes: No results for input(s): CKTOTAL, CKMB, CKMBINDEX, TROPONINI in the last 168 hours. BNP  (last 3 results)  Recent Labs  04/11/15 1026  PROBNP 1411.0*   HbA1C: No results for input(s): HGBA1C in the last 72 hours. CBG:  Recent Labs Lab 12/09/15 1855 12/09/15 2347 12/10/15 0521 12/10/15 1127 12/10/15 1634  GLUCAP 113* 110* 118* 125* 152*   Lipid Profile: No results for input(s): CHOL, HDL, LDLCALC, TRIG, CHOLHDL, LDLDIRECT in the last 72 hours. Thyroid Function Tests:  Recent Labs  12/09/15 0922  TSH 2.196  FREET4 1.12   Anemia Panel:  Recent Labs  12/09/15 1147  VITAMINB12 839   Urine analysis:    Component Value Date/Time   COLORURINE YELLOW 12/09/2015 0612   APPEARANCEUR CLOUDY* 12/09/2015 0612   LABSPEC 1.016 12/09/2015 0612   PHURINE 5.5 12/09/2015 0612   GLUCOSEU NEGATIVE 12/09/2015 0612   HGBUR SMALL* 12/09/2015 0612   BILIRUBINUR NEGATIVE 12/09/2015 0612   KETONESUR NEGATIVE 12/09/2015 0612   PROTEINUR 100* 12/09/2015 0612   UROBILINOGEN 0.2 03/19/2015 1223   NITRITE NEGATIVE 12/09/2015 0612   LEUKOCYTESUR MODERATE* 12/09/2015 0612   Sepsis Labs: @LABRCNTIP (procalcitonin:4,lacticidven:4)  ) Recent Results (from the past 240 hour(s))  Urine culture     Status: Abnormal   Collection Time: 12/09/15  6:12 AM  Result Value Ref Range Status   Specimen Description URINE, CATHETERIZED  Final   Special Requests NONE  Final   Culture MULTIPLE SPECIES PRESENT, SUGGEST RECOLLECTION (A)  Final   Report Status 12/10/2015 FINAL  Final         Radiology Studies: Dg Chest 2 View  12/09/2015  CLINICAL DATA:  Patient's sister fell on her at church, Pain right hip EXAM: CHEST  2 VIEW COMPARISON:  07/01/2015 FINDINGS: Cardiomegaly. Coronary artery calcifications visualize. Mild vascular congestion. No confluent opacities, effusions or overt edema. Prior right shoulder replacement. No acute bony abnormality. IMPRESSION: Cardiomegaly with vascular congestion. Coronary artery disease. Electronically Signed   By: Rolm Baptise M.D.   On: 12/09/2015  08:13   Ct Head Wo Contrast  12/09/2015  CLINICAL DATA:  Recent falls EXAM: CT HEAD WITHOUT CONTRAST TECHNIQUE: Contiguous axial images were obtained from the base of the skull through the vertex without intravenous contrast. COMPARISON:  09/25/2015 FINDINGS: The examination is somewhat limited by patient motion artifact. Bony calvarium is intact. Mild atrophic changes and mild chronic white matter ischemic changes are seen. No findings to suggest acute hemorrhage, acute infarction or space-occupying mass lesion are noted. Basal ganglia calcifications are again seen bilaterally. IMPRESSION: Mild atrophic and ischemic changes.  No acute abnormality noted. Electronically Signed   By: Inez Catalina M.D.   On: 12/09/2015 08:32   Dg Hip Unilat With Pelvis 2-3 Views Right  12/09/2015  CLINICAL DATA:  Hip pain.  Fall EXAM: DG HIP (WITH OR WITHOUT PELVIS) 2-3V RIGHT COMPARISON:  None. FINDINGS: Advanced degenerative changes in the hips bilaterally with joint space loss and spurring. Subchondral sclerosis and cyst formation. SI joints are symmetric and unremarkable. No acute bony abnormality. Specifically, no fracture, subluxation, or dislocation. Soft tissues are intact. IMPRESSION: Advanced degenerative changes in the hips bilaterally. No acute bony abnormality. Electronically Signed   By: Rolm Baptise M.D.   On: 12/09/2015 08:15        Scheduled Meds: . antiseptic oral rinse  7 mL Mouth Rinse q12n4p  . budesonide  0.25 mg Nebulization Daily  . chlorhexidine  15 mL Mouth Rinse BID  . enoxaparin (LOVENOX) injection  30 mg Subcutaneous Q24H  . insulin aspart  0-15 Units Subcutaneous Q6H  . metoprolol  10 mg Intravenous Q6H  . sodium chloride flush  3 mL Intravenous Q12H   Continuous Infusions: . sodium chloride 125 mL/hr at 12/10/15 1435     LOS: 1 day    Time spent: 25 minutes    Earlena Werst, MD [facility] Pager [phone]  If 7PM-7AM, please contact  night-coverage [email or web address] Password [facility] 12/10/2015, 7:03 PM

## 2015-12-10 NOTE — Evaluation (Signed)
Physical Therapy Evaluation Patient Details Name: [name] MRN: [id number] DOB: April 27, 1934 Today's Date: 12/10/2015   History of Present Illness  [name] is a 82 y.o. female with a past medical history that includes CAD, A. fib on Eliquis, COPD on oxygen chronic pain remote ovarian cancer presents to the emergency department with a chief complaint of persistent worsening generalized weakness/confusion with for falls. Initial evaluation reveals hypercalcemia hypokalemia.  Clinical Impression  Pt admitted with above diagnosis. Pt currently with functional limitations due to the deficits listed below (see PT Problem List). Pt demonstrating both cognitive and functional deficits. Pt was supervision and walker with RW at baseline and now requires assist x2 for OOB transfers. Pt will benefit from skilled PT to increase their independence and safety with mobility to allow discharge to the venue listed below.       Follow Up Recommendations SNF;Supervision/Assistance - 24 hour    Equipment Recommendations  None recommended by PT    Recommendations for Other Services       Precautions / Restrictions Precautions Precautions: Fall Restrictions Weight Bearing Restrictions: No      Mobility  Bed Mobility Overal bed mobility: Needs Assistance Bed Mobility: Supine to Sit     Supine to sit: Mod assist     General bed mobility comments: max directionla v/c's, pt able to bring LEs off, modA for trunk elevation  Transfers Overall transfer level: Needs assistance Equipment used:  (2 person lift with gait belt ) Transfers: Sit to/from [facility] Sit to Stand: Mod assist;+2 physical assistance Stand pivot transfers: Mod assist;+2 physical assistance       General transfer comment: max directional verbal and tactile cues to take steps to [facility] and then to Chair. increased anxiety/fear of falling   Ambulation/Gait                Stairs             Wheelchair Mobility    Modified Rankin (Stroke Patients Only)       Balance Overall balance assessment: Needs assistance Sitting-balance support: Feet supported;Bilateral upper extremity supported Sitting balance-Leahy Scale: Poor Sitting balance - Comments: needs minA to maintain midline Postural control: Posterior lean Standing balance support: Bilateral upper extremity supported Standing balance-Leahy Scale: Poor Standing balance comment: pt unsteady, fear of falling                             Pertinent Vitals/Pain Pain Assessment: No/denies pain    Home Living Family/patient expects to be discharged to:: Private residence Living Arrangements: Spouse/significant other;Other relatives Available Help at Discharge: Family;Available 24 hours/day Type of Home: House Home Access: Stairs to enter Entrance Stairs-Rails: None Entrance Stairs-Number of Steps: 1 Home Layout: Multi-level;Bed/bath upstairs Home Equipment: Walker - 2 wheels;Walker - 4 wheels;Shower seat;Wheelchair - manual;Bedside commode      Prior Function Level of Independence: Independent with assistive device(s)         Comments: used walker, could dress and bath self     Hand Dominance   Dominant Hand: Right    Extremity/Trunk Assessment   Upper Extremity Assessment: Generalized weakness           Lower Extremity Assessment: Generalized weakness      Cervical / Trunk Assessment: Normal  Communication   Communication: Expressive difficulties  Cognition Arousal/Alertness: Lethargic Behavior During Therapy: Flat affect Overall Cognitive Status: Impaired/Different from baseline Area of Impairment: Orientation;Attention;Following commands;Safety/judgement;Awareness;Problem solving Orientation  Level: Place;Time;Situation Current Attention Level: Sustained Memory: Decreased short-term memory Following Commands: Follows one step commands  inconsistently Safety/Judgement: Decreased awareness of deficits;Decreased awareness of safety Awareness: Emergent (pt knew she had to pee but couldn't hol dit) Problem Solving: Slow processing;Decreased initiation;Difficulty sequencing;Requires verbal cues;Requires tactile cues      General Comments General comments (skin integrity, edema, etc.): pt with urinary incontinence    Exercises        Assessment/Plan    PT Assessment Patient needs continued PT services  PT Diagnosis Difficulty walking;Generalized weakness   PT Problem List Decreased strength;Decreased activity tolerance;Decreased balance;Decreased mobility  PT Treatment Interventions DME instruction;Gait training;Functional mobility training;Therapeutic activities;Therapeutic exercise   PT Goals (Current goals can be found in the Care Plan section) Acute Rehab PT Goals Patient Stated Goal: didn't state PT Goal Formulation: With patient Time For Goal Achievement: 12/24/15 Potential to Achieve Goals: Good    Frequency Min 3X/week   Barriers to discharge        Co-evaluation               End of Session Equipment Utilized During Treatment: Gait belt Activity Tolerance: Patient limited by lethargy;Patient limited by fatigue Patient left: in chair;with call bell/phone within reach;with chair alarm set Nurse Communication: Mobility status         Time: 1130-1148 PT Time Calculation (min) (ACUTE ONLY): 18 min   Charges:   PT Evaluation $PT Eval Moderate Complexity: 1 Procedure     PT G CodesKingsley Callander 12/10/2015, 2:21 PM   Kittie Plater, PT, DPT Pager #: [phone] Office #: [phone]

## 2015-12-11 ENCOUNTER — Encounter (HOSPITAL_COMMUNITY): Payer: Self-pay | Admitting: Emergency Medicine

## 2015-12-11 LAB — BASIC METABOLIC PANEL
ANION GAP: 7 (ref 5–15)
BUN: 21 mg/dL — ABNORMAL HIGH (ref 6–20)
CALCIUM: 10.5 mg/dL — AB (ref 8.9–10.3)
CO2: 29 mmol/L (ref 22–32)
Chloride: 111 mmol/L (ref 101–111)
Creatinine, Ser: 1.1 mg/dL — ABNORMAL HIGH (ref 0.44–1.00)
GFR, EST AFRICAN AMERICAN: 54 mL/min — AB (ref >60)
GFR, EST NON AFRICAN AMERICAN: 46 mL/min — AB (ref >60)
Glucose, Bld: 160 mg/dL — ABNORMAL HIGH (ref 65–99)
POTASSIUM: 3 mmol/L — AB (ref 3.5–5.1)
SODIUM: 147 mmol/L — AB (ref 135–145)

## 2015-12-11 LAB — GLUCOSE, CAPILLARY
GLUCOSE-CAPILLARY: 163 mg/dL — AB (ref 65–99)
Glucose-Capillary: 153 mg/dL — ABNORMAL HIGH (ref 65–99)

## 2015-12-11 MED ORDER — TRAMADOL HCL 50 MG PO TABS
50.0000 mg | ORAL_TABLET | Freq: Four times a day (QID) | ORAL | Status: DC | PRN
Start: 2015-12-11 — End: 2015-12-12
  Administered 2015-12-12: 50 mg via ORAL
  Filled 2015-12-11 (×2): qty 1

## 2015-12-11 MED ORDER — POTASSIUM CHLORIDE CRYS ER 20 MEQ PO TBCR
40.0000 meq | EXTENDED_RELEASE_TABLET | Freq: Two times a day (BID) | ORAL | Status: AC
  Administered 2015-12-11 (×2): 40 meq via ORAL
  Filled 2015-12-11 (×2): qty 2

## 2015-12-11 MED ORDER — INSULIN ASPART 100 UNIT/ML ~~LOC~~ SOLN
0.0000 [IU] | Freq: Four times a day (QID) | SUBCUTANEOUS | Status: DC
  Administered 2015-12-11: 3 [IU] via SUBCUTANEOUS

## 2015-12-11 MED ORDER — LORAZEPAM 0.5 MG PO TABS
0.5000 mg | ORAL_TABLET | Freq: Once | ORAL | Status: AC
  Administered 2015-12-11: 0.5 mg via ORAL
  Filled 2015-12-11: qty 1

## 2015-12-11 NOTE — Clinical Social Work Placement (Addendum)
   CLINICAL SOCIAL WORK PLACEMENT  NOTE  Date:  12/11/2015  Patient Details  Name: [name] MRN: [id number] Date of Birth: [birth year]  Clinical Social Work is seeking post-discharge placement for this patient at the [place] level of care (*CSW will initial, date and re-position this form in  chart as items are completed):  Yes   Patient/family provided with [facility]'s list of facilities offering this level of care within the geographic area requested by the patient (or if unable, by the patient's family).      Patient/family informed of their freedom to choose among providers that offer the needed level of care, that participate in Medicare, Medicaid or managed care program needed by the patient, have an available bed and are willing to accept the patient.  Yes   Patient/family informed of [facility]'s ownership interest in [facility] and [facility], as well as of the fact that they are under no obligation to receive care at these facilities.  PASRR submitted to EDS on       PASRR number received on       Existing PASRR number confirmed on 12/11/15     FL2 transmitted to all facilities in geographic area requested by pt/family on 12/11/15     FL2 transmitted to all facilities within larger geographic area on       Patient informed that his/her managed care company has contracts with or will negotiate with certain facilities, including the following:            Patient/family informed of bed offers received 12/12/15.  Patient chooses bed at     [facility] recommends and patient chooses bed at      Patient to be transferred to   [facility] on  .12/12/15  Patient to be transferred to facility by     [facility]  Patient family notified on   5/1/17of transfer.  Name of family member notified:     spouse   PHYSICIAN       Additional Comment:     _______________________________________________ Boone Master, [place] 12/11/2015, 3:25 PM

## 2015-12-11 NOTE — ED Provider Notes (Deleted)
CSN: [id number]     Arrival date & time 09/25/15  1041 History   First MD Initiated Contact with Patient 09/25/15 1042     Chief Complaint  Patient presents with  . Near Syncope     (Consider location/radiation/quality/duration/timing/severity/associated sxs/prior Treatment) Patient is a 82 y.o. female presenting with fall. The history is provided by the patient and a relative.  Fall This is a recurrent problem. The current episode started 12 to 24 hours ago. The problem occurs constantly. The problem has not changed since onset.Pertinent negatives include no chest pain. Nothing aggravates the symptoms. Nothing relieves the symptoms. She has tried nothing for the symptoms. The treatment provided no relief.    Past Medical History  Diagnosis Date  . CAD (coronary artery disease)   . CHF (congestive heart failure) ([place])   . Paroxysmal atrial fibrillation (HCC)   . Bradycardia   . Hypertension   . Renal artery stenosis ([place])   . COPD (chronic obstructive pulmonary disease) ([place])   . Obesity hypoventilation syndrome ([place])   . Pruritus   . Tracheobronchitis   . Acute rhinosinusitis   . Anemia   . Hypoxemia   . Peripheral edema   . Obesity, endogenous   . Diabetes mellitus   . Acid reflux disease   . Osteoarthritis, generalized   . Hypothyroidism   . Lung nodule     lingula  . OSA (obstructive sleep apnea)     NPSG 04/03/00--AHI 28.hr  . DVT (deep venous thrombosis) ([place]) 2003    left leg  . Vaginal atrophy   . Hx of colonic polyps   . Acute diastolic heart failure ([place])   . Secondary pulmonary hypertension ([place]) 06/09/2013  . Blood transfusion without reported diagnosis 2009    had internal bleeding  . Cancer ([place]) 1959    ovarian cancer  . Osteoporosis   . Stroke ([place])   . Chronic pain    Past Surgical History  Procedure Laterality Date  . Lumbar spine surgery    . Cholecystectomy    . Hemorroidectomy    . Repair wound fistula    . Right shoulder    . Elbow  surgery    . Dilation and curettage of uterus    . Breast surgery  1998    lumpectomy  . Oophorectomy  1959    BSO-? ovarian cancer  . Bilateral salpingoophorectomy  1959    -d/t ovarian cancer  . Rotator cuff repair Right   . Cardiac catheterization N/A 03/21/2015    Procedure: Right Heart Cath;  Surgeon: Jolaine Artist, MD;  Location: [facility];  Service: Cardiovascular;  Laterality: N/A;   Family History  Problem Relation Age of Onset  . Pneumonia Mother   . Heart disease Mother   . Allergies Mother   . Emphysema Mother   . Arthritis Mother   . Hypertension Mother   . Heart attack Father   . Hypertension Father   . Diabetes Sister   . Heart disease Sister   . Diabetes Brother   . Diabetes      grandfather  . Breast cancer      aunt  . Bone cancer      aunt  . Clotting disorder Brother   . Arthritis Sister   . Breast cancer Maternal Aunt    Social History  Substance Use Topics  . Smoking status: Former Smoker    Quit date: 08/13/1978  . Smokeless tobacco: Never Used  . Alcohol  Use: No   OB History    Gravida Para Term Preterm AB TAB SAB Ectopic Multiple Living   0               Obstetric Comments   2 adopted children     Review of Systems  Cardiovascular: Negative for chest pain.  All other systems reviewed and are negative.     Allergies  Cefuroxime axetil; Cephalexin; Clarithromycin; Doxycycline; Erythromycin; Tetracycline; and Bactrim  Home Medications   Prior to Admission medications   Medication Sig Start Date End Date Taking? Authorizing Provider  acetaminophen (TYLENOL) 500 MG tablet Take 1,000 mg by mouth every 6 (six) hours as needed for mild pain.     Historical Provider, MD  albuterol (PROVENTIL HFA;VENTOLIN HFA) 108 (90 BASE) MCG/ACT inhaler Inhale 1 puff into the lungs every 4 (four) hours as needed for wheezing or shortness of breath.    Historical Provider, MD  albuterol (PROVENTIL) (2.5 MG/3ML) 0.083% nebulizer solution  Take 3 mLs (2.5 mg total) by nebulization every 2 (two) hours as needed for wheezing or shortness of breath. 03/25/15   Shanker Kristeen Mans, MD  allopurinol (ZYLOPRIM) 100 MG tablet Take 1 tablet (100 mg total) by mouth daily. Patient taking differently: Take 100 mg by mouth 2 (two) times daily.  07/21/13   Samella Parr, NP  amLODipine (NORVASC) 2.5 MG tablet Take 1 tablet (2.5 mg total) by mouth daily. 05/17/15   Jolaine Artist, MD  apixaban (ELIQUIS) 2.5 MG TABS tablet Take 2.5 mg by mouth 2 (two) times daily.    Historical Provider, MD  azelastine (ASTELIN) 137 MCG/SPRAY nasal spray 1-2 sprays each nostril once or twice daily 11/05/13   Deneise Lever, MD  bisacodyl (DULCOLAX) 5 MG EC tablet Take 5 mg by mouth daily as needed for moderate constipation.    Historical Provider, MD  budesonide (PULMICORT) 0.25 MG/2ML nebulizer solution Take 2 mLs (0.25 mg total) by nebulization daily. 05/13/14   Deneise Lever, MD  calcitRIOL (ROCALTROL) 0.25 MCG capsule Take 0.25 mcg by mouth daily.  06/06/15   Historical Provider, MD  colchicine 0.6 MG tablet Take 1.2 mg by mouth daily as needed (for gout flareups).     Historical Provider, MD  CRESTOR 20 MG tablet Take 20 mg by mouth daily.  06/10/15   Historical Provider, MD  diltiazem (CARDIZEM CD) 180 MG 24 hr capsule Take 180 mg by mouth daily.    Historical Provider, MD  diphenhydrAMINE (BENADRYL) 25 MG tablet Take 25 mg by mouth at bedtime as needed for sleep.    Historical Provider, MD  furosemide (LASIX) 20 MG tablet Take 3 tablets (60 mg total) by mouth 2 (two) times daily. 07/19/15   Amy D Ninfa Meeker, NP  guaiFENesin (MUCINEX) 600 MG 12 hr tablet Take 600 mg by mouth daily.    Historical Provider, MD  insulin glargine (LANTUS) 100 UNIT/ML injection Inject 20-35 Units into the skin 2 (two) times daily. Sliding scale    Historical Provider, MD  insulin lispro (HUMALOG) 100 UNIT/ML injection Inject 3 Units into the skin 2 (two) times daily with breakfast and  lunch. Per sliding scale. Patient unsure about how much she really takes    Historical Provider, MD  isosorbide mononitrate (IMDUR) 30 MG 24 hr tablet Take 30 mg by mouth daily.    Historical Provider, MD  levothyroxine (SYNTHROID, LEVOTHROID) 25 MCG tablet Take 25 mcg by mouth See admin instructions. Take 1 tablet (25 mcg) on MON,  WED, FRI, SUN    Historical Provider, MD  levothyroxine (SYNTHROID, LEVOTHROID) 50 MCG tablet Take 50 mcg by mouth See admin instructions. Take 50mg  on Tues, Thurs, Sat    Historical Provider, MD  methocarbamol (ROBAXIN) 500 MG tablet Take 500 mg by mouth at bedtime.    Historical Provider, MD  metolazone (ZAROXOLYN) 2.5 MG tablet Take 1 tablet (2.5 mg total) by mouth as needed. 10/31/15   Jolaine Artist, MD  metoprolol (LOPRESSOR) 50 MG tablet Take 50 mg by mouth 2 (two) times daily. Reported on 09/19/2015    Historical Provider, MD  nitroGLYCERIN (NITROSTAT) 0.4 MG SL tablet Place 0.4 mg under the tongue every 5 (five) minutes as needed for chest pain.     Historical Provider, MD  ondansetron (ZOFRAN) 4 MG tablet Take 1 tablet (4 mg total) by mouth every 8 (eight) hours as needed for nausea or vomiting. 07/19/15   Amy D Ninfa Meeker, NP  oxybutynin (DITROPAN XL) 10 MG 24 hr tablet Take 10 mg by mouth daily.     Historical Provider, MD  oxybutynin (DITROPAN-XL) 10 MG 24 hr tablet Take 10 mg by mouth at bedtime.    Historical Provider, MD  potassium chloride 20 MEQ TBCR Take 10 mEq by mouth daily. 09/22/15   Larey Dresser, MD  traMADol (ULTRAM) 50 MG tablet Take 50 mg by mouth every 6 (six) hours as needed for moderate pain.     Historical Provider, MD   BP 137/71 mmHg  Pulse 89  Temp(Src) 97.6 F (36.4 C) (Oral)  Resp 18  Ht 5\' 1"  (1.549 m)  Wt 160 lb (72.576 kg)  BMI 30.25 kg/m2  SpO2 95%  LMP 08/13/1957 Physical Exam  Constitutional: She appears well-developed and well-nourished. No distress.  HENT:  Head: Normocephalic and atraumatic.  Dry mm  Eyes: Conjunctivae  are normal. Pupils are equal, round, and reactive to light.  Neck: Normal range of motion. Neck supple.  Cardiovascular: Normal rate and regular rhythm.   Pulmonary/Chest: Effort normal and breath sounds normal. No stridor. No respiratory distress. She has no wheezes. She has no rales.  Abdominal: Soft. Bowel sounds are normal. There is no tenderness. There is no rebound and no guarding.  Musculoskeletal: Normal range of motion. She exhibits no edema or tenderness.  Neurological: She is alert. She has normal reflexes.  Skin: Skin is warm and dry.  Psychiatric: She has a normal mood and affect.    ED Course  Procedures (including critical care time) Labs Review Labs Reviewed  CBC WITH DIFFERENTIAL/PLATELET - Abnormal; Notable for the following:    RDW 16.0 (*)    Platelets 142 (*)    All other components within normal limits  BASIC METABOLIC PANEL - Abnormal; Notable for the following:    Glucose, Bld 169 (*)    BUN 40 (*)    Creatinine, Ser 2.14 (*)    GFR calc non Af Amer 21 (*)    GFR calc Af Amer 24 (*)    All other components within normal limits  URINALYSIS, ROUTINE W REFLEX MICROSCOPIC (NOT AT [facility]) - Abnormal; Notable for the following:    Protein, ur 100 (*)    Leukocytes, UA SMALL (*)    All other components within normal limits  URINE MICROSCOPIC-ADD ON - Abnormal; Notable for the following:    Squamous Epithelial / LPF 6-30 (*)    Bacteria, UA FEW (*)    Casts GRANULAR CAST (*)    All other components within  normal limits  URINE CULTURE    Imaging Review No results found. I have personally reviewed and evaluated these images and lab results as part of my medical decision-making.   EKG Interpretation   Date/Time:  Sunday September 25 2015 10:51:06 EST Ventricular Rate:  85 PR Interval:    QRS Duration: 125 QT Interval:  430 QTC Calculation: 511 R Axis:   86 Text Interpretation:  Atrial fibrillation IVCD, consider atypical RBBB  Baseline wander in lead(s)  I III aVL V5 V6 No significant change since  last tracing Confirmed by FLOYD MD, Quillian Quince [id number]) on 09/25/2015 11:12:30  AM      MDM   Final diagnoses:  Head injury, initial encounter  Fall, initial encounter  Laceration    Results for orders placed or performed during the hospital encounter of 09/25/15  Urine culture  Result Value Ref Range   Specimen Description URINE, RANDOM    Special Requests Immunocompromised    Culture      >=100,000 COLONIES/mL GROUP B STREP(S.AGALACTIAE)ISOLATED TESTING AGAINST S. AGALACTIAE NOT ROUTINELY PERFORMED DUE TO PREDICTABILITY OF AMP/PEN/VAN SUSCEPTIBILITY.    Report Status 09/26/2015 FINAL   CBC with Differential  Result Value Ref Range   WBC 6.9 4.0 - 10.5 K/uL   RBC 3.97 3.87 - 5.11 MIL/uL   Hemoglobin 12.7 12.0 - 15.0 g/dL   HCT 39.0 36.0 - 46.0 %   MCV 98.2 78.0 - 100.0 fL   MCH 32.0 26.0 - 34.0 pg   MCHC 32.6 30.0 - 36.0 g/dL   RDW 16.0 (H) 11.5 - 15.5 %   Platelets 142 (L) 150 - 400 K/uL   Neutrophils Relative % 68 %   Neutro Abs 4.7 1.7 - 7.7 K/uL   Lymphocytes Relative 21 %   Lymphs Abs 1.4 0.7 - 4.0 K/uL   Monocytes Relative 9 %   Monocytes Absolute 0.6 0.1 - 1.0 K/uL   Eosinophils Relative 2 %   Eosinophils Absolute 0.2 0.0 - 0.7 K/uL   Basophils Relative 0 %   Basophils Absolute 0.0 0.0 - 0.1 K/uL  Basic metabolic panel  Result Value Ref Range   Sodium 138 135 - 145 mmol/L   Potassium 4.1 3.5 - 5.1 mmol/L   Chloride 102 101 - 111 mmol/L   CO2 25 22 - 32 mmol/L   Glucose, Bld 169 (H) 65 - 99 mg/dL   BUN 40 (H) 6 - 20 mg/dL   Creatinine, Ser 2.14 (H) 0.44 - 1.00 mg/dL   Calcium 9.6 8.9 - 10.3 mg/dL   GFR calc non Af Amer 21 (L) >60 mL/min   GFR calc Af Amer 24 (L) >60 mL/min   Anion gap 11 5 - 15  Urinalysis, Routine w reflex microscopic (not at [facility])  Result Value Ref Range   Color, Urine YELLOW YELLOW   APPearance CLEAR CLEAR   Specific Gravity, Urine 1.013 1.005 - 1.030   pH 5.0 5.0 - 8.0   Glucose, UA  NEGATIVE NEGATIVE mg/dL   Hgb urine dipstick NEGATIVE NEGATIVE   Bilirubin Urine NEGATIVE NEGATIVE   Ketones, ur NEGATIVE NEGATIVE mg/dL   Protein, ur 100 (A) NEGATIVE mg/dL   Nitrite NEGATIVE NEGATIVE   Leukocytes, UA SMALL (A) NEGATIVE  Urine microscopic-add on  Result Value Ref Range   Squamous Epithelial / LPF 6-30 (A) NONE SEEN   WBC, UA 6-30 0 - 5 WBC/hpf   RBC / HPF NONE SEEN 0 - 5 RBC/hpf   Bacteria, UA FEW (A) NONE SEEN  Casts GRANULAR CAST (A) NEGATIVE   Dg Chest 2 View  12/09/2015  CLINICAL DATA:  Patient's sister fell on her at church, Pain right hip EXAM: CHEST  2 VIEW COMPARISON:  07/01/2015 FINDINGS: Cardiomegaly. Coronary artery calcifications visualize. Mild vascular congestion. No confluent opacities, effusions or overt edema. Prior right shoulder replacement. No acute bony abnormality. IMPRESSION: Cardiomegaly with vascular congestion. Coronary artery disease. Electronically Signed   By: Rolm Baptise M.D.   On: 12/09/2015 08:13   Ct Head Wo Contrast  12/09/2015  CLINICAL DATA:  Recent falls EXAM: CT HEAD WITHOUT CONTRAST TECHNIQUE: Contiguous axial images were obtained from the base of the skull through the vertex without intravenous contrast. COMPARISON:  09/25/2015 FINDINGS: The examination is somewhat limited by patient motion artifact. Bony calvarium is intact. Mild atrophic changes and mild chronic white matter ischemic changes are seen. No findings to suggest acute hemorrhage, acute infarction or space-occupying mass lesion are noted. Basal ganglia calcifications are again seen bilaterally. IMPRESSION: Mild atrophic and ischemic changes.  No acute abnormality noted. Electronically Signed   By: Inez Catalina M.D.   On: 12/09/2015 08:32   Dg Hip Unilat With Pelvis 2-3 Views Right  12/09/2015  CLINICAL DATA:  Hip pain.  Fall EXAM: DG HIP (WITH OR WITHOUT PELVIS) 2-3V RIGHT COMPARISON:  None. FINDINGS: Advanced degenerative changes in the hips bilaterally with joint space  loss and spurring. Subchondral sclerosis and cyst formation. SI joints are symmetric and unremarkable. No acute bony abnormality. Specifically, no fracture, subluxation, or dislocation. Soft tissues are intact. IMPRESSION: Advanced degenerative changes in the hips bilaterally. No acute bony abnormality. Electronically Signed   By: Rolm Baptise M.D.   On: 12/09/2015 08:15    Plan admit for hypercalcemia    Alette Kataoka, MD 12/11/15 2303

## 2015-12-11 NOTE — Clinical Social Work Note (Signed)
Clinical Social Work Assessment  Patient Details  Name: [name] MRN: [id number] Date of Birth: September 04, 1933  Date of referral:  12/11/15               Reason for consult:  Discharge Planning                Permission sought to share information with:  Family Supports Permission granted to share information::  Yes, Verbal Permission Granted  Name::     Product manager::     Relationship::  spouse  Contact Information:     Housing/Transportation Living arrangements for the past 2 months:  Single Family Home Source of Information:  Patient Patient Interpreter Needed:  None Criminal Activity/Legal Involvement Pertinent to Current Situation/Hospitalization:  No - Comment as needed Significant Relationships:  Spouse Lives with:  Spouse Do you feel safe going back to the place where you live?  Yes Need for family participation in patient care:  Yes (Comment)  Care giving concerns:  PT recommends SNF at DC.   Social Worker assessment / plan:  CSW met with pt and husband at bedside in order to discuss DC plans. Pt lives at home with husband but is agreeable to SNF. Pt has been to [facility] in the past and is interested in returning there or going to [facility]. CSW explained SNF process and pt is agreeable to [facility] search.  CSW completed FL2 and faxed out. CSW to follow up with bed offers.  Employment status:  Retired Forensic scientist:  Medicare PT Recommendations:  [place] / Referral to community resources:  [place]  Patient/Family's Response to care:  Pt alert and engaged in assessment.  Patient/Family's Understanding of and Emotional Response to Diagnosis, Current Treatment, and Prognosis:  Pt hopes to recover quickly to return back home with husband.   Emotional Assessment Appearance:  Appears stated age Attitude/Demeanor/Rapport:  Other (Pleasant) Affect (typically observed):  Appropriate Orientation:   Oriented to Self, Oriented to Place Alcohol / Substance use:  Not Applicable Psych involvement (Current and /or in the community):  No (Comment)  Discharge Needs  Concerns to be addressed:  No discharge needs identified Readmission within the last 30 days:  Yes Current discharge risk:  None Barriers to Discharge:  No Barriers Identified   [name], [place] 12/11/2015, 3:23 PM Weekend Coverage

## 2015-12-11 NOTE — [place] FL2 (Signed)
[place] MEDICAID FL2 LEVEL OF CARE SCREENING TOOL     IDENTIFICATION  Patient Name: [name] Birthdate: March 19, 1934 Sex: female Admission Date (Current Location): 12/09/2015  [facility] and [place] Number:  Herbalist and Address:  [facility] [place], [place] [address] [place], [place] [address]      Provider Number: [id number]  Attending Physician Name and Address:  Hosie Poisson, MD  Relative Name and Phone Number:       Current Level of Care: Hospital Recommended Level of Care: [place] Prior Approval Number:    Date Approved/Denied:   PASRR Number: [id number]  Discharge Plan: SNF    Current Diagnoses: Patient Active Problem List   Diagnosis Date Noted  . Hypercalcemia 12/09/2015  . Dysphagia 12/09/2015  . Acute encephalopathy 12/09/2015  . AKI (acute kidney injury) ([place]) 06/27/2015  . Bronchitis 06/27/2015  . CKD (chronic kidney disease), stage IV ([place]) 05/17/2015  . Pulmonary hypertension ([place]) 04/12/2015  . Acute on chronic diastolic CHF (congestive heart failure), NYHA class 1 ([place]) 03/31/2015  . Acute on chronic diastolic congestive heart failure ([place])   . Cardiac failure ([place])   . Acute on chronic renal failure ([place]) 03/28/2015  . Right ventricular dysfunction 03/28/2015  . Chronic respiratory failure with hypoxia ([place]) 03/28/2015  . Chronic anticoagulation 02/27/2015  . Type 2 diabetes mellitus with other circulatory complications ([place]) [id number]  . Stroke ([place])   . Atrial fibrillation with RVR ([place]) 12/07/2014  . Renal insufficiency 12/07/2014  . Obesity 12/07/2014  . Hyperlipidemia 12/07/2014  . Cigarette smoker 12/07/2014  . TIA (transient ischemic attack) 12/04/2014  . Chronic diastolic heart failure ([place]) 06/09/2014  . Coronary atherosclerosis of native coronary artery 12/09/2013  . Pneumonitis 08/28/2013  . Osteoporosis 07/29/2013  . Constipation 07/29/2013  . Overactive bladder 07/29/2013   . Anxiety 07/29/2013  . Type II or unspecified type diabetes mellitus with renal manifestations, not stated as uncontrolled 07/22/2013  . Secondary renovascular hypertension, benign 07/22/2013  . Hyperkalemia 07/16/2013  . HCAP (healthcare-associated pneumonia) 07/15/2013  . Protein-calorie malnutrition, severe ([place]) 07/10/2013  . Renal failure 07/09/2013  . Secondary pulmonary hypertension ([place]) 06/09/2013  . CAP (community acquired pneumonia) 06/08/2013  . COPD with acute exacerbation ([place]) 06/07/2013  . CHF (congestive heart failure), NYHA class II ([place]) 06/07/2013  . Allergic rhinitis due to pollen 01/11/2011  . Obstructive sleep apnea 04/09/2010  . Hypothyroidism 02/09/2010  . Essential hypertension 02/09/2010  . Coronary atherosclerosis 02/09/2010  . Atrial fibrillation ([place]) 02/09/2010  . BRADYCARDIA 02/09/2010  . RENAL ARTERY STENOSIS 02/09/2010  . COPD (chronic obstructive pulmonary disease) ([place]) 02/09/2010  . GENERALIZED OSTEOARTHROSIS UNSPECIFIED SITE 02/09/2010  . PRURITUS 07/29/2009  . Tracheobronchitis 09/28/2008  . RHINOSINUSITIS, ACUTE 11/07/2007  . LUNG NODULE 11/07/2007  . ANEMIA 08/25/2007  . Hypoxemia 08/25/2007  . Diabetes type 2, uncontrolled ([place]) 08/19/2007  . ENDOGENOUS OBESITY 08/19/2007  . PERIPHERAL EDEMA 08/19/2007  . ASTHMA, INTRINSIC, WITH ACUTE EXACERBATION 08/15/2007  . ACID REFLUX DISEASE 08/15/2007  . DYSPNEA 08/15/2007    Orientation RESPIRATION BLADDER Height & Weight     Self, Place  O2 (2L) Incontinent Weight: 154 lb 7.2 oz (70.058 kg) Height:     BEHAVIORAL SYMPTOMS/MOOD NEUROLOGICAL BOWEL NUTRITION STATUS      Continent Diet (Soft)  AMBULATORY STATUS COMMUNICATION OF NEEDS Skin   Extensive Assist Verbally Normal  Personal Care Assistance Level of Assistance  Bathing, Feeding, Dressing Bathing Assistance: Maximum assistance Feeding assistance: Limited assistance Dressing Assistance: Maximum  assistance     Functional Limitations Info  Sight, Hearing, Speech Sight Info: Adequate Hearing Info: Adequate Speech Info: Adequate    SPECIAL CARE FACTORS FREQUENCY  PT (By licensed PT), OT (By licensed OT)                    Contractures Contractures Info: Not present    Additional Factors Info  Code Status, Allergies, Insulin Sliding Scale Code Status Info: Full Allergies Info: Cefuroxime Axetil, Cephalexin, Clarithromycin, Doxycycline, Erythromycin, Tetracycline, Bactrim           Current Medications (12/11/2015):  This is the current hospital active medication list Current Facility-Administered Medications  Medication Dose Route Frequency Provider Last Rate Last Dose  . 0.9 %  sodium chloride infusion   Intravenous Continuous Hosie Poisson, MD 75 mL/hr at 12/11/15 1334    . acetaminophen (TYLENOL) tablet 650 mg  650 mg Oral Q6H PRN Radene Gunning, NP   650 mg at 12/11/15 1031   Or  . acetaminophen (TYLENOL) suppository 650 mg  650 mg Rectal Q6H PRN Radene Gunning, NP   650 mg at 12/10/15 0153  . albuterol (PROVENTIL) (2.5 MG/3ML) 0.083% nebulizer solution 2.5 mg  2.5 mg Nebulization Q2H PRN Radene Gunning, NP      . antiseptic oral rinse (CPC / CETYLPYRIDINIUM CHLORIDE 0.05%) solution 7 mL  7 mL Mouth Rinse q12n4p Samuella Cota, MD   7 mL at 12/11/15 1316  . budesonide (PULMICORT) nebulizer solution 0.25 mg  0.25 mg Nebulization Daily Lezlie Octave Black, NP   0.25 mg at 12/11/15 0909  . chlorhexidine (PERIDEX) 0.12 % solution 15 mL  15 mL Mouth Rinse BID Samuella Cota, MD   15 mL at 12/11/15 0955  . enoxaparin (LOVENOX) injection 30 mg  30 mg Subcutaneous Q24H Samuella Cota, MD   30 mg at 12/10/15 2000  . insulin aspart (novoLOG) injection 0-15 Units  0-15 Units Subcutaneous Q6H Hosie Poisson, MD   0 Units at 12/11/15 1200  . metoprolol (LOPRESSOR) injection 10 mg  10 mg Intravenous Q6H Samuella Cota, MD   10 mg at 12/11/15 1311  . ondansetron (ZOFRAN) tablet 4  mg  4 mg Oral Q6H PRN Radene Gunning, NP       Or  . ondansetron [facility]) injection 4 mg  4 mg Intravenous Q6H PRN Lezlie Octave Black, NP      . potassium chloride SA (K-DUR,KLOR-CON) CR tablet 40 mEq  40 mEq Oral BID Hosie Poisson, MD   40 mEq at 12/11/15 0952  . sodium chloride flush (NS) 0.9 % injection 3 mL  3 mL Intravenous Q12H Radene Gunning, NP   3 mL at 12/11/15 0956  . traMADol (ULTRAM) tablet 50 mg  50 mg Oral Q6H PRN Hosie Poisson, MD         Discharge Medications: Please see discharge summary for a list of discharge medications.  Relevant Imaging Results:  Relevant Lab Results:   Additional Information    Boone Master, LCSW

## 2015-12-11 NOTE — Progress Notes (Signed)
PROGRESS NOTE    [name]  [id number] DOB: 26-Aug-1933 DOA: 12/09/2015 PCP: Cari Caraway, MD      Brief Narrative: 82 year old female with history of coronary artery disease, congestive heart failure, paroxysmal A. fib on Eliquis, chronic hypoxia on supplemental O2, arthritis, presented to the ED with a history of increasing lethargy, generalized weakness and multiple falls over the last week  Assessment & Plan:   Principal Problem:   Hypercalcemia Active Problems:   Hypothyroidism   Diabetes type 2, uncontrolled (HCC)   Essential hypertension   Atrial fibrillation (HCC)   COPD (chronic obstructive pulmonary disease) (HCC)   Chronic diastolic heart failure (HCC)   CKD (chronic kidney disease), stage IV ([place])   Dysphagia   Acute encephalopathy   Hypercalcemia: Probably from dehydration, improved with IV hydration and IV pamidronate.  Will need further evaluation to detect malignancy after she stabilizes, but will need to talk to family how aggressive they want to work up.  Low PTH.  VIT D levels normal. Normal TSH and free t4. Decrease the rate of the fluids. Continue the same treatemtn for now.    Hypothyroidism: Resume synthroid.   Diabetes mellitus: Resume SSI.   Atrial fibrillation: Rate controlled.    Chronic diastolic heart failure: Does not appear to be decompensated.    Acute encephalopathy and multiple falls possibly from hypercalcemia and dehydration. Improved. She is alert and awake.  PT eval recommended SNF PLACEMENT.    DVT prophylaxis: (Lovenox) Code Status: (Full) Family Communication: discussed with family at bedside.  Disposition Plan: SNF when calcium normalizes.    Consultants:   none  Procedures: none Antimicrobials: none  Subjective: Reports knee pain.  No abd pain, chest pain or sob.   Objective: Filed Vitals:   12/11/15 0611 12/11/15 0631 12/11/15 0909 12/11/15 1310  BP: 131/69   143/72  Pulse: 67  97 76    Temp:    98.7 F (37.1 C)  TempSrc:    Oral  Resp:   16   Weight:  70.058 kg (154 lb 7.2 oz)    SpO2:   94% 95%    Intake/Output Summary (Last 24 hours) at 12/11/15 1719 Last data filed at 12/11/15 1600  Gross per 24 hour  Intake 1223.33 ml  Output      0 ml  Net 1223.33 ml   Filed Weights   12/09/15 1130 12/11/15 0631  Weight: 66.679 kg (147 lb) 70.058 kg (154 lb 7.2 oz)    Examination:  General exam: Appears calm and comfortable  Respiratory system: Clear to auscultation. Respiratory effort normal. Cardiovascular system: S1 & S2 heard, RRR. No JVD, murmurs, rubs, gallops or clicks. No pedal edema. Gastrointestinal system: Abdomen is nondistended, soft and nontender. No organomegaly or masses felt. Normal bowel sounds heard. Central nervous system: Alert  But confused.  Extremities: Symmetric 5 x 5 power. Skin: No rashes, lesions or ulcers     Data Reviewed: I have personally reviewed following labs and imaging studies  CBC:  Recent Labs Lab 12/09/15 0616 12/10/15 0642  WBC 12.4* 11.7*  NEUTROABS 10.2*  --   HGB 14.0 13.4  HCT 42.6 42.2  MCV 99.5 101.4*  PLT 169 123456*   Basic Metabolic Panel:  Recent Labs Lab 12/09/15 0742 12/09/15 0922 12/09/15 1218 12/10/15 0642 12/10/15 1918 12/11/15 0723  NA 142  --   --  147* 145 147*  K 2.7*  --   --  3.6 3.0* 3.0*  CL 99*  --   --  109 108 111  CO2 31  --   --  27 28 29   GLUCOSE 123*  --   --  134* 175* 160*  BUN 39*  --   --  25* 24* 21*  CREATININE 1.79*  --   --  1.38* 1.12* 1.10*  CALCIUM >15.0* 15.2*  --  12.8* 11.4* 10.5*  MG  --  1.8  --   --   --   --   PHOS  --   --  2.5  --   --   --    GFR: Estimated Creatinine Clearance: 37.1 mL/min (by C-G formula based on Cr of 1.1). Liver Function Tests:  Recent Labs Lab 12/09/15 1218 12/10/15 0642  AST 39 37  ALT 24 23  ALKPHOS 50 46  BILITOT 1.0 1.3*  PROT 7.0 6.7  ALBUMIN 3.9 3.6   No results for input(s): LIPASE, AMYLASE in the last 168  hours. No results for input(s): AMMONIA in the last 168 hours. Coagulation Profile: No results for input(s): INR, PROTIME in the last 168 hours. Cardiac Enzymes: No results for input(s): CKTOTAL, CKMB, CKMBINDEX, TROPONINI in the last 168 hours. BNP (last 3 results)  Recent Labs  04/11/15 1026  PROBNP 1411.0*   HbA1C: No results for input(s): HGBA1C in the last 72 hours. CBG:  Recent Labs Lab 12/10/15 1127 12/10/15 1634 12/10/15 2251 12/11/15 0623 12/11/15 1650  GLUCAP 125* 152* 103* 163* 153*   Lipid Profile: No results for input(s): CHOL, HDL, LDLCALC, TRIG, CHOLHDL, LDLDIRECT in the last 72 hours. Thyroid Function Tests:  Recent Labs  12/09/15 0922  TSH 2.196  FREET4 1.12   Anemia Panel:  Recent Labs  12/09/15 1147  VITAMINB12 839   Urine analysis:    Component Value Date/Time   COLORURINE YELLOW 12/09/2015 0612   APPEARANCEUR CLOUDY* 12/09/2015 0612   LABSPEC 1.016 12/09/2015 0612   PHURINE 5.5 12/09/2015 0612   GLUCOSEU NEGATIVE 12/09/2015 0612   HGBUR SMALL* 12/09/2015 0612   BILIRUBINUR NEGATIVE 12/09/2015 0612   KETONESUR NEGATIVE 12/09/2015 0612   PROTEINUR 100* 12/09/2015 0612   UROBILINOGEN 0.2 03/19/2015 1223   NITRITE NEGATIVE 12/09/2015 0612   LEUKOCYTESUR MODERATE* 12/09/2015 0612   Sepsis Labs: @LABRCNTIP (procalcitonin:4,lacticidven:4)  ) Recent Results (from the past 240 hour(s))  Urine culture     Status: Abnormal   Collection Time: 12/09/15  6:12 AM  Result Value Ref Range Status   Specimen Description URINE, CATHETERIZED  Final   Special Requests NONE  Final   Culture MULTIPLE SPECIES PRESENT, SUGGEST RECOLLECTION (A)  Final   Report Status 12/10/2015 FINAL  Final         Radiology Studies: No results found.      Scheduled Meds: . antiseptic oral rinse  7 mL Mouth Rinse q12n4p  . budesonide  0.25 mg Nebulization Daily  . chlorhexidine  15 mL Mouth Rinse BID  . enoxaparin (LOVENOX) injection  30 mg Subcutaneous  Q24H  . insulin aspart  0-15 Units Subcutaneous Q6H  . metoprolol  10 mg Intravenous Q6H  . potassium chloride  40 mEq Oral BID  . sodium chloride flush  3 mL Intravenous Q12H   Continuous Infusions: . sodium chloride 75 mL/hr at 12/11/15 1334     LOS: 2 days    Time spent: 25 minutes    Ellwood Steidle, MD [facility] Pager [phone]  If 7PM-7AM, please contact night-coverage [email or web address] Password [facility] 12/11/2015, 5:19 PM

## 2015-12-12 LAB — PROTEIN ELECTROPHORESIS, SERUM
A/G Ratio: 1.2 (ref 0.7–1.7)
ALPHA-1-GLOBULIN: 0.2 g/dL (ref 0.0–0.4)
ALPHA-2-GLOBULIN: 0.9 g/dL (ref 0.4–1.0)
Albumin ELP: 3.7 g/dL (ref 2.9–4.4)
Beta Globulin: 1 g/dL (ref 0.7–1.3)
GAMMA GLOBULIN: 1.1 g/dL (ref 0.4–1.8)
GLOBULIN, TOTAL: 3.2 g/dL (ref 2.2–3.9)
TOTAL PROTEIN ELP: 6.9 g/dL (ref 6.0–8.5)

## 2015-12-12 LAB — BASIC METABOLIC PANEL
Anion gap: 10 (ref 5–15)
BUN: 25 mg/dL — AB (ref 6–20)
CALCIUM: 9.6 mg/dL (ref 8.9–10.3)
CO2: 21 mmol/L — AB (ref 22–32)
Chloride: 112 mmol/L — ABNORMAL HIGH (ref 101–111)
Creatinine, Ser: 1.26 mg/dL — ABNORMAL HIGH (ref 0.44–1.00)
GFR calc Af Amer: 46 mL/min — ABNORMAL LOW (ref >60)
GFR, EST NON AFRICAN AMERICAN: 39 mL/min — AB (ref >60)
GLUCOSE: 176 mg/dL — AB (ref 65–99)
Potassium: 4.1 mmol/L (ref 3.5–5.1)
Sodium: 143 mmol/L (ref 135–145)

## 2015-12-12 LAB — IMMUNOFIXATION ELECTROPHORESIS
IGG (IMMUNOGLOBIN G), SERUM: 1191 mg/dL (ref 700–1600)
IgA: 90 mg/dL (ref 64–422)
IgM, Serum: 52 mg/dL (ref 26–217)
TOTAL PROTEIN ELP: 7.3 g/dL (ref 6.0–8.5)

## 2015-12-12 LAB — FOLATE RBC
Folate, RBC: >1538 ng/mL (ref >498)
Hematocrit: 40.3 % (ref 34.0–46.6)

## 2015-12-12 LAB — GLUCOSE, CAPILLARY
GLUCOSE-CAPILLARY: 117 mg/dL — AB (ref 65–99)
GLUCOSE-CAPILLARY: 119 mg/dL — AB (ref 65–99)
Glucose-Capillary: 141 mg/dL — ABNORMAL HIGH (ref 65–99)

## 2015-12-12 MED ORDER — FUROSEMIDE 20 MG PO TABS: 40.0000 mg | ORAL_TABLET | Freq: Two times a day (BID) | ORAL | Status: DC

## 2015-12-12 NOTE — Care Management Note (Signed)
Case Management Note  Patient Details  Name: [name] MRN: [id number] Date of Birth: 1934/03/30  Subjective/Objective:                 Patient admitted with hypercalcemia, will DC to SNF today as facilitated through [place].    Action/Plan:   Expected Discharge Date:                  Expected Discharge Plan:  Skilled Nursing Facility  In-House Referral:  Clinical Social Work  Discharge planning Services  CM Consult  Post Acute Care Choice:    Choice offered to:     DME Arranged:    DME Agency:     HH Arranged:    [place] Agency:     Status of Service:  Completed, signed off  Medicare Important Message Given:    Date Medicare IM Given:    Medicare IM give by:    Date Additional Medicare IM Given:    Additional Medicare Important Message give by:     If discussed at [place] of Stay Meetings, dates discussed:    Additional Comments:  Carles Collet, RN 12/12/2015, 1:51 PM

## 2015-12-12 NOTE — Progress Notes (Signed)
Patient will discharge to [facility] Anticipated discharge date: 5/1 Family notified: spouse at bedside Transportation by [facility]- called at 2:15pm  CSW signing off.  Domenica Reamer, [place] Social Worker [phone]

## 2015-12-12 NOTE — Care Management Important Message (Signed)
Important Message  Patient Details  Name: [name] MRN: [id number] Date of Birth: [birth year]   Medicare Important Message Given:  Yes    [name] May 12/12/2015, 4:25 PM

## 2015-12-12 NOTE — Progress Notes (Signed)
Physical Therapy Treatment Patient Details Name: [name] MRN: [id number] DOB: [birth year] Today's Date: 12/12/2015    History of Present Illness [name] is a 82 y.o. female with a past medical history that includes CAD, A. fib on Eliquis, COPD on oxygen chronic pain remote ovarian cancer presents to the emergency department with a chief complaint of persistent worsening generalized weakness/confusion with for falls. Initial evaluation reveals hypercalcemia hypokalemia.    PT Comments    Patient is making gradual progress with improved sitting balance and less assist needed for bed mobility. Mod A +2 for stand pivot transfers. Continue to progress as tolerated with anticipated d/c to SNF for further skilled PT services.    Follow Up Recommendations  SNF;Supervision/Assistance - 24 hour     Equipment Recommendations  None recommended by PT    Recommendations for Other Services       Precautions / Restrictions Precautions Precautions: Fall Restrictions Weight Bearing Restrictions: No    Mobility  Bed Mobility Overal bed mobility: Needs Assistance Bed Mobility: Supine to Sit     Supine to sit: Min assist;HOB elevated     General bed mobility comments: max cues for sequencing and increased time needed; min A to elevate trunk into sitting  Transfers Overall transfer level: Needs assistance Equipment used:  (2 person lift with gait belt ) Transfers: Stand Pivot Transfers Sit to Stand: Mod assist;+2 physical assistance Stand pivot transfers: Mod assist;+2 physical assistance       General transfer comment: max cues for hand placement, technique, and posture; pt unable to achieve erect posture; able to take short pirvotal steps and two side steps to chair; assist for safe descent to chair with pt following commands for hand placement on armrests  Ambulation/Gait                 Stairs            Wheelchair Mobility    Modified  Rankin (Stroke Patients Only)       Balance Overall balance assessment: Needs assistance Sitting-balance support: Feet supported;Bilateral upper extremity supported Sitting balance-Leahy Scale: Fair     Standing balance support: Bilateral upper extremity supported Standing balance-Leahy Scale: Poor                      Cognition Arousal/Alertness: Awake/alert Behavior During Therapy: Flat affect Overall Cognitive Status: Impaired/Different from baseline Area of Impairment: Orientation;Attention;Following commands;Safety/judgement;Awareness;Problem solving Orientation Level: Time;Situation Current Attention Level: Sustained Memory: Decreased short-term memory Following Commands: Follows one step commands with increased time Safety/Judgement: Decreased awareness of deficits;Decreased awareness of safety Awareness: Emergent (pt knew she had to pee but couldn't hol dit) Problem Solving: Slow processing;Decreased initiation;Difficulty sequencing;Requires verbal cues;Requires tactile cues      Exercises General Exercises - Lower Extremity Ankle Circles/Pumps: AROM;Both;10 reps;Seated Long Arc Quad: AROM;Both;10 reps;Seated Heel Slides: AROM;Both;10 reps;Supine    General Comments General comments (skin integrity, edema, etc.): sister in law (caregiver) present       Pertinent Vitals/Pain Pain Assessment: Faces Faces Pain Scale: Hurts a little bit Pain Location: back and legs Pain Descriptors / Indicators: Sore Pain Intervention(s): Monitored during session;Repositioned    Home Living                      Prior Function            PT Goals (current goals can now be found in the care plan section) Acute Rehab PT Goals Patient  Stated Goal: didn't state PT Goal Formulation: With patient Time For Goal Achievement: 12/24/15 Potential to Achieve Goals: Good Progress towards PT goals: Progressing toward goals    Frequency  Min 3X/week    PT Plan  Current plan remains appropriate    Co-evaluation             End of Session Equipment Utilized During Treatment: Gait belt Activity Tolerance: Patient tolerated treatment well Patient left: in chair;with call bell/phone within reach;with chair alarm set;with family/visitor present     Time: [id number] PT Time Calculation (min) (ACUTE ONLY): 30 min  Charges:  $Therapeutic Exercise: 8-22 mins $Therapeutic Activity: 8-22 mins                    G Codes:      [place] April, PTA Pager: [phone]   12/12/2015, 12:10 PM

## 2015-12-12 NOTE — Discharge Summary (Signed)
Physician Discharge Summary  [name] [id number] DOB: 1933/09/09 DOA: 12/09/2015  PCP: Cari Caraway, MD  Admit date: 12/09/2015 Discharge date: 12/12/2015  Time spent: 30 minutes  Recommendations for Outpatient Follow-up:  Please provide humidifier with oxygen Please follow up with pcp in one week. Please obtain calcium levels in 3 days.   Discharge Diagnoses:  Principal Problem:   Hypercalcemia Active Problems:   Hypothyroidism   Diabetes type 2, uncontrolled (HCC)   Essential hypertension   Atrial fibrillation (HCC)   COPD (chronic obstructive pulmonary disease) (HCC)   Chronic diastolic heart failure (HCC)   CKD (chronic kidney disease), stage IV ([place])   Dysphagia   Acute encephalopathy   Discharge Condition: improved  Diet recommendation: low sodium / carb modified diet.   Filed Weights   12/09/15 1130 12/11/15 0631 12/12/15 0535  Weight: 66.679 kg (147 lb) 70.058 kg (154 lb 7.2 oz) 71.3 kg (157 lb 3 oz)    History of present illness:  82 year old female with history of coronary artery disease, congestive heart failure, paroxysmal A. fib on Eliquis, chronic hypoxia on supplemental O2, arthritis, presented to the ED with a history of increasing lethargy, generalized weakness and multiple falls over the last week  Hospital Course:  Hypercalcemia: Probably from dehydration, improved with IV hydration and IV pamidronate.  Will need further evaluation to detect malignancy after she stabilizes, but will need to talk to family how aggressive they want to work up.  Low PTH. VIT D levels normal. Normal TSH and free t4.  Follow up calcium levels in one week.  Follow up with PCP in one week.    Acute on CKD stage 2: slightly improved.    Hypothyroidism: Resume synthroid.   Diabetes mellitus: Resume SSI.   Atrial fibrillation: Rate controlled.    Chronic diastolic heart failure: Does not appear to be decompensated.    Acute encephalopathy and  multiple falls possibly from hypercalcemia and dehydration. Improved. She is alert and awake.  PT eval recommended SNF PLACEMENT.    Procedures:  none  Consultations:  none  Discharge Exam: Filed Vitals:   12/12/15 0535 12/12/15 1023  BP: 153/85   Pulse: 97 71  Temp: 97.8 F (36.6 C)   Resp: 20 16    General: alert comfortable.  Cardiovascular: s1s2 Respiratory: ctab  Discharge Instructions   Discharge Instructions    Diet - low sodium heart healthy    Complete by:  As directed      Discharge instructions    Complete by:  As directed   Please follow up with PCP in one week.  Please check bmp in one week to check calcium level and creatinine.          Current Discharge Medication List    CONTINUE these medications which have CHANGED   Details  furosemide (LASIX) 20 MG tablet Take 2 tablets (40 mg total) by mouth 2 (two) times daily. Qty: 30 tablet, Refills: 3   Associated Diagnoses: Pulmonary hypertension ([place])      CONTINUE these medications which have NOT CHANGED   Details  acetaminophen (TYLENOL) 500 MG tablet Take 1,000 mg by mouth every 6 (six) hours as needed for mild pain.     albuterol (PROVENTIL HFA;VENTOLIN HFA) 108 (90 BASE) MCG/ACT inhaler Inhale 1 puff into the lungs every 4 (four) hours as needed for wheezing or shortness of breath.    albuterol (PROVENTIL) (2.5 MG/3ML) 0.083% nebulizer solution Take 3 mLs (2.5 mg total) by nebulization every 2 (two) hours  as needed for wheezing or shortness of breath. Qty: 75 mL, Refills: 3    allopurinol (ZYLOPRIM) 100 MG tablet Take 1 tablet (100 mg total) by mouth daily.    apixaban (ELIQUIS) 2.5 MG TABS tablet Take 2.5 mg by mouth 2 (two) times daily.    azelastine (ASTELIN) 137 MCG/SPRAY nasal spray 1-2 sprays each nostril once or twice daily Qty: 90 mL, Refills: 3    bisacodyl (DULCOLAX) 5 MG EC tablet Take 5 mg by mouth daily as needed for moderate constipation.    budesonide (PULMICORT) 0.25  MG/2ML nebulizer solution Take 2 mLs (0.25 mg total) by nebulization daily. Qty: 60 mL, Refills: prn    colchicine 0.6 MG tablet Take 1.2 mg by mouth daily as needed (for gout flareups).     CRESTOR 20 MG tablet Take 20 mg by mouth daily.     diltiazem (CARDIZEM CD) 180 MG 24 hr capsule Take 180 mg by mouth daily.    diphenhydrAMINE (BENADRYL) 25 MG tablet Take 25 mg by mouth at bedtime as needed for sleep.    guaiFENesin (MUCINEX) 600 MG 12 hr tablet Take 600 mg by mouth daily.    insulin glargine (LANTUS) 100 UNIT/ML injection Inject 20-35 Units into the skin 2 (two) times daily. Sliding scale    isosorbide mononitrate (IMDUR) 30 MG 24 hr tablet Take 30 mg by mouth daily.    !! levothyroxine (SYNTHROID, LEVOTHROID) 25 MCG tablet Take 25 mcg by mouth See admin instructions. Take 1 tablet (25 mcg) on MON, WED, FRI, SUN    !! levothyroxine (SYNTHROID, LEVOTHROID) 50 MCG tablet Take 50 mcg by mouth See admin instructions. Take 50mg  on Tues, Thurs, Sat    methocarbamol (ROBAXIN) 500 MG tablet Take 500 mg by mouth at bedtime.    metoprolol (LOPRESSOR) 50 MG tablet Take 50 mg by mouth 2 (two) times daily. Reported on 09/19/2015    ondansetron (ZOFRAN) 4 MG tablet Take 1 tablet (4 mg total) by mouth every 8 (eight) hours as needed for nausea or vomiting. Qty: 30 tablet, Refills: 0   Associated Diagnoses: Gastroesophageal reflux disease, esophagitis presence not specified    !! oxybutynin (DITROPAN XL) 10 MG 24 hr tablet Take 10 mg by mouth daily.     !! oxybutynin (DITROPAN-XL) 10 MG 24 hr tablet Take 10 mg by mouth at bedtime.    traMADol (ULTRAM) 50 MG tablet Take 50 mg by mouth every 6 (six) hours as needed for moderate pain.    Associated Diagnoses: Bronchitis, not specified as acute or chronic    insulin lispro (HUMALOG) 100 UNIT/ML injection Inject 3 Units into the skin 2 (two) times daily with breakfast and lunch. Per sliding scale. Patient unsure about how much she really takes     nitroGLYCERIN (NITROSTAT) 0.4 MG SL tablet Place 0.4 mg under the tongue every 5 (five) minutes as needed for chest pain.     potassium chloride 20 MEQ TBCR Take 10 mEq by mouth daily. Qty: 30 tablet, Refills: 3     !! - Potential duplicate medications found. Please discuss with provider.    STOP taking these medications     amLODipine (NORVASC) 2.5 MG tablet      calcitRIOL (ROCALTROL) 0.25 MCG capsule      metolazone (ZAROXOLYN) 2.5 MG tablet        Allergies  Allergen Reactions  . Cefuroxime Axetil Shortness Of Breath, Swelling and Rash    Throat swelling  . Cephalexin Shortness Of Breath, Swelling and  Rash    Throat swelling  . Clarithromycin Shortness Of Breath, Swelling and Rash    Throat swelling  . Doxycycline Shortness Of Breath, Swelling and Rash     rash on legs and feet, throat swelling  . Erythromycin Shortness Of Breath, Swelling and Rash    Throat swelling  . Tetracycline Shortness Of Breath, Swelling and Rash    Throat swelling  . Bactrim [Sulfamethoxazole-Trimethoprim] Hives   Follow-up Information    Follow up with MCNEILL,WENDY, MD. Schedule an appointment as soon as possible for a visit in 1 week.   Specialty:  Family Medicine   Contact information:   [place] [address] [phone]        The results of significant diagnostics from this hospitalization (including imaging, microbiology, ancillary and laboratory) are listed below for reference.    Significant Diagnostic Studies: Dg Chest 2 View  12/09/2015  CLINICAL DATA:  Patient's sister fell on her at church, Pain right hip EXAM: CHEST  2 VIEW COMPARISON:  07/01/2015 FINDINGS: Cardiomegaly. Coronary artery calcifications visualize. Mild vascular congestion. No confluent opacities, effusions or overt edema. Prior right shoulder replacement. No acute bony abnormality. IMPRESSION: Cardiomegaly with vascular congestion. Coronary artery disease. Electronically Signed   By:  Rolm Baptise M.D.   On: 12/09/2015 08:13   Ct Head Wo Contrast  12/09/2015  CLINICAL DATA:  Recent falls EXAM: CT HEAD WITHOUT CONTRAST TECHNIQUE: Contiguous axial images were obtained from the base of the skull through the vertex without intravenous contrast. COMPARISON:  09/25/2015 FINDINGS: The examination is somewhat limited by patient motion artifact. Bony calvarium is intact. Mild atrophic changes and mild chronic white matter ischemic changes are seen. No findings to suggest acute hemorrhage, acute infarction or space-occupying mass lesion are noted. Basal ganglia calcifications are again seen bilaterally. IMPRESSION: Mild atrophic and ischemic changes.  No acute abnormality noted. Electronically Signed   By: Inez Catalina M.D.   On: 12/09/2015 08:32   Dg Hip Unilat With Pelvis 2-3 Views Right  12/09/2015  CLINICAL DATA:  Hip pain.  Fall EXAM: DG HIP (WITH OR WITHOUT PELVIS) 2-3V RIGHT COMPARISON:  None. FINDINGS: Advanced degenerative changes in the hips bilaterally with joint space loss and spurring. Subchondral sclerosis and cyst formation. SI joints are symmetric and unremarkable. No acute bony abnormality. Specifically, no fracture, subluxation, or dislocation. Soft tissues are intact. IMPRESSION: Advanced degenerative changes in the hips bilaterally. No acute bony abnormality. Electronically Signed   By: Rolm Baptise M.D.   On: 12/09/2015 08:15    Microbiology: Recent Results (from the past 240 hour(s))  Urine culture     Status: Abnormal   Collection Time: 12/09/15  6:12 AM  Result Value Ref Range Status   Specimen Description URINE, CATHETERIZED  Final   Special Requests NONE  Final   Culture MULTIPLE SPECIES PRESENT, SUGGEST RECOLLECTION (A)  Final   Report Status 12/10/2015 FINAL  Final     Labs: Basic Metabolic Panel:  Recent Labs Lab 12/09/15 0742 12/09/15 0922 12/09/15 1218 12/10/15 0642 12/10/15 1918 12/11/15 0723 12/12/15 0947  NA 142  --   --  147* 145 147* 143   K 2.7*  --   --  3.6 3.0* 3.0* 4.1  CL 99*  --   --  109 108 111 112*  CO2 31  --   --  27 28 29  21*  GLUCOSE 123*  --   --  134* 175* 160* 176*  BUN 39*  --   --  25* 24* 21* 25*  CREATININE 1.79*  --   --  1.38* 1.12* 1.10* 1.26*  CALCIUM >15.0* 15.2*  --  12.8* 11.4* 10.5* 9.6  MG  --  1.8  --   --   --   --   --   PHOS  --   --  2.5  --   --   --   --    Liver Function Tests:  Recent Labs Lab 12/09/15 1218 12/10/15 0642  AST 39 37  ALT 24 23  ALKPHOS 50 46  BILITOT 1.0 1.3*  PROT 7.0 6.7  ALBUMIN 3.9 3.6   No results for input(s): LIPASE, AMYLASE in the last 168 hours. No results for input(s): AMMONIA in the last 168 hours. CBC:  Recent Labs Lab 12/09/15 0616 12/10/15 0642  WBC 12.4* 11.7*  NEUTROABS 10.2*  --   HGB 14.0 13.4  HCT 42.6 42.2  MCV 99.5 101.4*  PLT 169 144*   Cardiac Enzymes: No results for input(s): CKTOTAL, CKMB, CKMBINDEX, TROPONINI in the last 168 hours. BNP: BNP (last 3 results)  Recent Labs  03/28/15 2018 04/19/15 1214 05/17/15 1115  BNP 1496.2* 1411.2* 1045.0*    ProBNP (last 3 results)  Recent Labs  04/11/15 1026  PROBNP 1411.0*    CBG:  Recent Labs Lab 12/11/15 0623 12/11/15 1650 12/12/15 0016 12/12/15 0556 12/12/15 1152  GLUCAP 163* 153* 119* 117* 141*       Signed:  Melisia Leming MD.  [facility] 12/12/2015, 1:31 PM

## 2015-12-13 ENCOUNTER — Ambulatory Visit: Payer: Medicare Other | Admitting: Neurology

## 2015-12-22 ENCOUNTER — Ambulatory Visit (HOSPITAL_COMMUNITY)
Admission: RE | Admit: 2015-12-22 | Discharge: 2015-12-22 | Disposition: A | Discharge status: Home / Self Care | Payer: No Typology Code available for payment source | Source: Ambulatory Visit | Attending: Internal Medicine | Admitting: Internal Medicine

## 2015-12-22 VITALS — BP 119/64 | HR 80 | Resp 18 | Wt 165.5 lb

## 2015-12-22 DIAGNOSIS — I5033 Acute on chronic diastolic (congestive) heart failure: Secondary | ICD-10-CM | POA: Diagnosis not present

## 2015-12-22 DIAGNOSIS — I482 Chronic atrial fibrillation, unspecified: Secondary | ICD-10-CM

## 2015-12-22 DIAGNOSIS — Z7901 Long term (current) use of anticoagulants: Secondary | ICD-10-CM | POA: Diagnosis not present

## 2015-12-22 DIAGNOSIS — J449 Chronic obstructive pulmonary disease, unspecified: Secondary | ICD-10-CM | POA: Insufficient documentation

## 2015-12-22 DIAGNOSIS — Z794 Long term (current) use of insulin: Secondary | ICD-10-CM | POA: Diagnosis not present

## 2015-12-22 DIAGNOSIS — Z8249 Family history of ischemic heart disease and other diseases of the circulatory system: Secondary | ICD-10-CM | POA: Insufficient documentation

## 2015-12-22 DIAGNOSIS — E1122 Type 2 diabetes mellitus with diabetic chronic kidney disease: Secondary | ICD-10-CM | POA: Insufficient documentation

## 2015-12-22 DIAGNOSIS — Z7902 Long term (current) use of antithrombotics/antiplatelets: Secondary | ICD-10-CM | POA: Diagnosis not present

## 2015-12-22 DIAGNOSIS — Z87891 Personal history of nicotine dependence: Secondary | ICD-10-CM | POA: Insufficient documentation

## 2015-12-22 DIAGNOSIS — Z833 Family history of diabetes mellitus: Secondary | ICD-10-CM | POA: Insufficient documentation

## 2015-12-22 DIAGNOSIS — Z86718 Personal history of other venous thrombosis and embolism: Secondary | ICD-10-CM | POA: Insufficient documentation

## 2015-12-22 DIAGNOSIS — I272 Other secondary pulmonary hypertension: Secondary | ICD-10-CM | POA: Diagnosis not present

## 2015-12-22 DIAGNOSIS — I130 Hypertensive heart and chronic kidney disease with heart failure and stage 1 through stage 4 chronic kidney disease, or unspecified chronic kidney disease: Secondary | ICD-10-CM | POA: Diagnosis not present

## 2015-12-22 DIAGNOSIS — E039 Hypothyroidism, unspecified: Secondary | ICD-10-CM | POA: Diagnosis not present

## 2015-12-22 DIAGNOSIS — M109 Gout, unspecified: Secondary | ICD-10-CM | POA: Insufficient documentation

## 2015-12-22 DIAGNOSIS — Z8543 Personal history of malignant neoplasm of ovary: Secondary | ICD-10-CM | POA: Insufficient documentation

## 2015-12-22 DIAGNOSIS — K219 Gastro-esophageal reflux disease without esophagitis: Secondary | ICD-10-CM | POA: Insufficient documentation

## 2015-12-22 DIAGNOSIS — N184 Chronic kidney disease, stage 4 (severe): Secondary | ICD-10-CM | POA: Diagnosis not present

## 2015-12-22 DIAGNOSIS — I5032 Chronic diastolic (congestive) heart failure: Secondary | ICD-10-CM

## 2015-12-22 DIAGNOSIS — G4733 Obstructive sleep apnea (adult) (pediatric): Secondary | ICD-10-CM | POA: Insufficient documentation

## 2015-12-22 DIAGNOSIS — Z955 Presence of coronary angioplasty implant and graft: Secondary | ICD-10-CM | POA: Insufficient documentation

## 2015-12-22 DIAGNOSIS — I2510 Atherosclerotic heart disease of native coronary artery without angina pectoris: Secondary | ICD-10-CM | POA: Insufficient documentation

## 2015-12-22 DIAGNOSIS — Z9981 Dependence on supplemental oxygen: Secondary | ICD-10-CM | POA: Insufficient documentation

## 2015-12-22 DIAGNOSIS — Z8673 Personal history of transient ischemic attack (TIA), and cerebral infarction without residual deficits: Secondary | ICD-10-CM | POA: Insufficient documentation

## 2015-12-22 DIAGNOSIS — M1990 Unspecified osteoarthritis, unspecified site: Secondary | ICD-10-CM | POA: Insufficient documentation

## 2015-12-22 DIAGNOSIS — Z825 Family history of asthma and other chronic lower respiratory diseases: Secondary | ICD-10-CM | POA: Insufficient documentation

## 2015-12-22 LAB — BASIC METABOLIC PANEL
Anion gap: 11 (ref 5–15)
BUN: 30 mg/dL — AB (ref 6–20)
CALCIUM: 8.3 mg/dL — AB (ref 8.9–10.3)
CHLORIDE: 99 mmol/L — AB (ref 101–111)
CO2: 28 mmol/L (ref 22–32)
CREATININE: 1.87 mg/dL — AB (ref 0.44–1.00)
GFR calc Af Amer: 28 mL/min — ABNORMAL LOW (ref >60)
GFR calc non Af Amer: 24 mL/min — ABNORMAL LOW (ref >60)
GLUCOSE: 113 mg/dL — AB (ref 65–99)
Potassium: 3.8 mmol/L (ref 3.5–5.1)
Sodium: 138 mmol/L (ref 135–145)

## 2015-12-22 MED ORDER — POTASSIUM CHLORIDE ER 20 MEQ PO TBCR: 10.0000 meq | EXTENDED_RELEASE_TABLET | Freq: Every day | ORAL | Status: AC

## 2015-12-22 MED ORDER — METOLAZONE 2.5 MG PO TABS: 2.5000 mg | ORAL_TABLET | ORAL | Status: DC | PRN

## 2015-12-22 MED ORDER — PREDNISONE 20 MG PO TABS: ORAL_TABLET | ORAL | Status: DC

## 2015-12-22 NOTE — Progress Notes (Signed)
Patient ID: [name], female   DOB: 1933-09-04, 82 y.o.   MRN: [id number]   Advanced Heart Failure Clinic Note   Patient ID: [name], female   DOB: Dec 18, 1933, 82 y.o.   MRN: [id number] PCP: Dr Addison Lank  Primary HF Cardiologist: Dr Vaughan Browner  HPI: [name] is a 82 y.o. female with history of chronic diastolic HF with RV dysfunction, OSA, COPD, pulmonary hypertension, chronic rate controlled A fib, type 2 diabetes.  She was admitted to [facility] on 03/16/15 when she presented with with worsening SOB, orthopnea, and PND. BNP 1109. CXR showing pulmonary congestion. Underwent 2d echo and right heart catheterization as below. Discharge weight 175, and diuretic regimen was decreased from Lasix 40 BID to 60 daily.  Admitted 03/16/15 with increased dyspnea. Diuresed with IV lasix and transitioned to lasix 40 mg daily. Has AKI noted on admit with creatinine up to 2.6. Creatinine down to 1.65 on the day of discharge. Discharge weight was 180 pounds.   Admitted 06/27/15 with AKI. Lasix had recently been increase and pt has narrow euvolemic window of 167-170. Was 164 at PCP. Labs at PCP showed Cr 1.8 -> 2.6 (06/23/15). Creatinine was 3.1 on arrival to ED with weight 163.8. Discharge weight was 161 lb, despite holding of diuretics and gentle IVF. Lasix decreased to 60 mg BID.   Admitted 4/28 - 5/1 with lethargy and generalized weakness. Weighed 147 lbs on admit.    Thought to be dehydrated. She was + 1.7 L and up 10 lbs. D/c weight 157 lbs. Lasix dose decreased to 40 mg BID  She returns for post hospital follow up. At last visit she weighed 156. She is up 8 lbs from discharge. Working with rehab at [facility].  Primary complaint is R hip pain, that has been getting worse.  Xray during recent admission showed degenerative changes but no acute changes. Breathing has been ok, but feet are swelling. Cant get her shoes on.  She is on 2 liters continuously. Wears CPAP at night.  Taking all medications. No bleeding.  Recently was treated for UTI at nursing center, had fever.  Denies DOE working with PT, mostly limited by hip pain.  Does think she has some orthopnea.  Not getting any metolazone.   Labs 11/16 K 4.1, Creatinine 2.01=> 1.89 Labs 12/09/15 K 2.7, Cr 1.79, Calcium > 15.0 (Admitted) Labs 12/12/15 K 4.1, Creatinine 1.26  RHC 03/21/15 RA = 14 RV = 60/10/14 PA = 65/30 (43) PCW = 20 Fick cardiac output/index = 3.8/2.1 PVR = 6.1 WU Arterial sat = 94% PA sat = 55%, 56%  Echo 03/17/15 EF 60-65%, RV severely dilated with moderately reduced systolic function. PA peak pressure 68 mm Hg.  ROS: All systems negative except as listed in HPI, PMH and Problem List.  SH:  Social History   Social History  . Marital Status: Married    Spouse Name: N/A  . Number of Children: 2  . Years of Education: N/A   Occupational History  . retired     Social History Main Topics  . Smoking status: Former Smoker    Quit date: 08/13/1978  . Smokeless tobacco: Never Used  . Alcohol Use: No  . Drug Use: No  . Sexual Activity: No   Other Topics Concern  . Not on file   Social History Narrative   Lives with husband       FH:  Family History  Problem Relation Age of Onset  . Pneumonia  Mother   . Heart disease Mother   . Allergies Mother   . Emphysema Mother   . Arthritis Mother   . Hypertension Mother   . Heart attack Father   . Hypertension Father   . Diabetes Sister   . Heart disease Sister   . Diabetes Brother   . Diabetes      grandfather  . Breast cancer      aunt  . Bone cancer      aunt  . Clotting disorder Brother   . Arthritis Sister   . Breast cancer Maternal Aunt     Past Medical History  Diagnosis Date  . CAD (coronary artery disease)   . CHF (congestive heart failure) ([place])   . Paroxysmal atrial fibrillation (HCC)   . Bradycardia   . Hypertension   . Renal artery stenosis ([place])   . COPD (chronic obstructive pulmonary disease) ([place])     . Obesity hypoventilation syndrome ([place])   . Pruritus   . Tracheobronchitis   . Acute rhinosinusitis   . Anemia   . Hypoxemia   . Peripheral edema   . Obesity, endogenous   . Diabetes mellitus   . Acid reflux disease   . Osteoarthritis, generalized   . Hypothyroidism   . Lung nodule     lingula  . OSA (obstructive sleep apnea)     NPSG 04/03/00--AHI 28.hr  . DVT (deep venous thrombosis) ([place]) 2003    left leg  . Vaginal atrophy   . Hx of colonic polyps   . Acute diastolic heart failure ([place])   . Secondary pulmonary hypertension ([place]) 06/09/2013  . Blood transfusion without reported diagnosis 2009    had internal bleeding  . Cancer ([place]) 1959    ovarian cancer  . Osteoporosis   . Stroke ([place])   . Chronic pain     Current Outpatient Prescriptions  Medication Sig Dispense Refill  . acetaminophen (TYLENOL) 500 MG tablet Take 1,000 mg by mouth every 6 (six) hours as needed for mild pain. Reported on 12/22/2015    . albuterol (PROVENTIL HFA;VENTOLIN HFA) 108 (90 BASE) MCG/ACT inhaler Inhale 1 puff into the lungs every 4 (four) hours as needed for wheezing or shortness of breath.    Marland Kitchen albuterol (PROVENTIL) (2.5 MG/3ML) 0.083% nebulizer solution Take 3 mLs (2.5 mg total) by nebulization every 2 (two) hours as needed for wheezing or shortness of breath. 75 mL 3  . allopurinol (ZYLOPRIM) 100 MG tablet Take 100 mg by mouth daily.    Marland Kitchen apixaban (ELIQUIS) 2.5 MG TABS tablet Take 2.5 mg by mouth 2 (two) times daily.    Marland Kitchen azelastine (ASTELIN) 137 MCG/SPRAY nasal spray 1-2 sprays each nostril once or twice daily 90 mL 3  . bisacodyl (DULCOLAX) 5 MG EC tablet Take 5 mg by mouth daily as needed for moderate constipation.    . budesonide (PULMICORT) 0.25 MG/2ML nebulizer solution Take 2 mLs (0.25 mg total) by nebulization daily. 60 mL prn  . ciprofloxacin (CIPRO) 250 MG tablet Take 250 mg by mouth 2 (two) times daily.    . colchicine 0.6 MG tablet Take 1.2 mg by mouth daily as needed (for gout  flareups).     . CRESTOR 20 MG tablet Take 20 mg by mouth daily.     Marland Kitchen diltiazem (CARDIZEM CD) 180 MG 24 hr capsule Take 180 mg by mouth daily.    . diphenhydrAMINE (BENADRYL) 25 MG tablet Take 25 mg by mouth at bedtime as needed for  sleep.    . furosemide (LASIX) 40 MG tablet Take 60 mg by mouth 2 (two) times daily.    Marland Kitchen guaiFENesin (MUCINEX) 600 MG 12 hr tablet Take 600 mg by mouth daily.    . insulin glargine (LANTUS) 100 UNIT/ML injection Inject 20-35 Units into the skin 2 (two) times daily. Sliding scale    . insulin lispro (HUMALOG) 100 UNIT/ML injection Inject 3 Units into the skin 2 (two) times daily with breakfast and lunch. Per sliding scale. Patient unsure about how much she really takes    . isosorbide mononitrate (IMDUR) 30 MG 24 hr tablet Take 30 mg by mouth daily.    Marland Kitchen levothyroxine (SYNTHROID, LEVOTHROID) 25 MCG tablet Take 25 mcg by mouth See admin instructions. Take 1 tablet (25 mcg) on MON, WED, FRI, SUN    . levothyroxine (SYNTHROID, LEVOTHROID) 50 MCG tablet Take 50 mcg by mouth See admin instructions. Take 50mg  on Tues, Thurs, Sat    . methocarbamol (ROBAXIN) 500 MG tablet Take 500 mg by mouth at bedtime.    . nitroGLYCERIN (NITROSTAT) 0.4 MG SL tablet Place 0.4 mg under the tongue every 5 (five) minutes as needed for chest pain.     Marland Kitchen ondansetron (ZOFRAN) 4 MG tablet Take 1 tablet (4 mg total) by mouth every 8 (eight) hours as needed for nausea or vomiting. 30 tablet 0  . oxybutynin (DITROPAN XL) 10 MG 24 hr tablet Take 10 mg by mouth daily.     Marland Kitchen oxybutynin (DITROPAN-XL) 10 MG 24 hr tablet Take 10 mg by mouth at bedtime.    . potassium chloride 20 MEQ TBCR Take 10 mEq by mouth daily. 30 tablet 3  . traMADol (ULTRAM) 50 MG tablet Take 50 mg by mouth every 6 (six) hours as needed for moderate pain.      No current facility-administered medications for this encounter.    Filed Vitals:   12/22/15 1401  BP: 119/64  Pulse: 80  Resp: 18  Weight: 165 lb 8 oz (75.07 kg)    SpO2: 92%    PHYSICAL EXAM:  General:  Chronically ill appearing. No resp distress. HEENT: normal. On 2 liters [place].  Neck: supple. JVP ~ 8-9 cm. Carotids 2+ bilaterally; no bruits. No thyromegaly or nodule noted. Cor: PMI normal. Irregular rhythm. No M/G/R appreciated Lungs: Slightly diminished bases Abdomen: obese, soft, non-tender, non-distended. No HSM. No bruits or masses. +BS Extremities: no cyanosis, clubbing, rash. Trace ankle edema. Tender to touch with gout flare.  Marked varicose veins. Neuro: alert & orientedx3, cranial nerves grossly intact. Moves all 4 extremities w/o difficulty. Affect pleasant.  ASSESSMENT & PLAN: 1. Acute on chronic diastolic CHF/right heart failure: Echo 03/17/15 EF 60-65%, RV severely dilated with moderately reduced systolic function.  NYHA class II-III symptoms.  Volume slightly up. Lasix decreased after - Increase Lasix to 60 mg bid.  Check BMET today.  - Take 2.5 mg metolazone today.  Can take further as needed for weight > 160 lbs. Should take an addition 40 meq potassium when given metolazone.  - Reinforced daily weights, limiting fluids and making low salt food choices.  2. Pulmonary Hypertension: PA peak pressure 68 mm Hg last echo. RHC PA 65/30 (43). Had [place] 03/2015 with PVR 6. Suspect mostly group 3 pulmonary hypertension due to chronic lung disease. Will continue to follow. Will not start pulmonary vasodilator at this point. Continue home oxygen and CPAP at night.  3. COPD: On chronic 2 liters 02 4. A fib: Chronic.  Remains in A fib by exam Continue Eliquis 2.5 mg twice daily. Rate controlled on metoprolol and cardizem.  5. H/o CAD s/p stenting of LAD 2008.  No evidence of ischemia.  6. CKD stage IV: Check BMET today.  7. OHS/OSA: currently using CPAP nightly and oxygen during the day. 8. Gout  - Can use prednisone 40 mg daily for 3-4 days for acute gout as needed.  Shirley Friar PA-C 12/22/2015  Patient seen and examined with Oda Kilts, PA-C. We discussed all aspects of the encounter. I agree with the assessment and plan as stated above.   Volume status looks to be trending back up. Agree with changes as above. Get labs today. Continue Eliquis for AF.  Bensimhon, Daniel,MD 3:28 PM

## 2015-12-22 NOTE — Patient Instructions (Signed)
Increase Furosemide to 60 mg Twice daily   Take Metolazone 2.5 mg for weight of 160 lb or greater  Take and extra 40 meq of Potassium when you take Metolazone  Can take Prednisone 40 mg daily for 2-3 days as needed for gout  Labs today  Your physician recommends that you schedule a follow-up appointment in: 1 month

## 2015-12-26 ENCOUNTER — Ambulatory Visit: Payer: Medicare Other | Admitting: Internal Medicine

## 2016-01-04 ENCOUNTER — Other Ambulatory Visit (HOSPITAL_COMMUNITY): Payer: Self-pay | Admitting: *Deleted

## 2016-01-17 ENCOUNTER — Telehealth: Payer: Self-pay | Admitting: Internal Medicine

## 2016-01-17 NOTE — Telephone Encounter (Signed)
Patient dropped off handicap parking form to be completed by Dr. Annamaria Boots. Form completed and mailed to patient per patient's request. Called and informed patient via voicemail that form has been completed and mailed back. Nothing further needed.

## 2016-01-24 ENCOUNTER — Ambulatory Visit (INDEPENDENT_AMBULATORY_CARE_PROVIDER_SITE_OTHER): Payer: Medicare Other | Admitting: Neurology

## 2016-01-24 ENCOUNTER — Encounter: Payer: Self-pay | Admitting: Neurology

## 2016-01-24 ENCOUNTER — Encounter (HOSPITAL_COMMUNITY): Payer: Medicare Other

## 2016-01-24 VITALS — BP 123/61 | HR 57 | Wt 158.2 lb

## 2016-01-24 DIAGNOSIS — E1159 Type 2 diabetes mellitus with other circulatory complications: Secondary | ICD-10-CM

## 2016-01-24 DIAGNOSIS — G451 Carotid artery syndrome (hemispheric): Secondary | ICD-10-CM

## 2016-01-24 DIAGNOSIS — I482 Chronic atrial fibrillation, unspecified: Secondary | ICD-10-CM

## 2016-01-24 DIAGNOSIS — Z7901 Long term (current) use of anticoagulants: Secondary | ICD-10-CM

## 2016-01-24 DIAGNOSIS — I10 Essential (primary) hypertension: Secondary | ICD-10-CM

## 2016-01-24 NOTE — Progress Notes (Signed)
Patient ID: [name], female   DOB: August 11, 1934, 82 y.o.   MRN: [id number]   Advanced Heart Failure Clinic Note  PCP: Dr Addison Lank  Primary HF Cardiologist: Dr Vaughan Browner  HPI: [name] is a 82 y.o. female with history of chronic diastolic HF with RV dysfunction, OSA, COPD, pulmonary hypertension, chronic rate controlled A fib, type 2 diabetes.  She was admitted to [facility] on 03/16/15 when she presented with with worsening SOB, orthopnea, and PND. BNP 1109. CXR showing pulmonary congestion. Underwent 2d echo and right heart catheterization as below. Discharge weight 175, and diuretic regimen was decreased from Lasix 40 BID to 60 daily.  Admitted 03/16/15 with increased dyspnea. Diuresed with IV lasix and transitioned to lasix 40 mg daily. Has AKI noted on admit with creatinine up to 2.6. Creatinine down to 1.65 on the day of discharge. Discharge weight was 180 pounds.   Admitted 06/27/15 with AKI. Lasix had recently been increase and pt has narrow euvolemic window of 167-170. Was 164 at PCP. Labs at PCP showed Cr 1.8 -> 2.6 (06/23/15). Creatinine was 3.1 on arrival to ED with weight 163.8. Discharge weight was 161 lb, despite holding of diuretics and gentle IVF. Lasix decreased to 60 mg BID.   Admitted 4/28 - 5/1 with lethargy and generalized weakness. Weighed 147 lbs on admit.    Thought to be dehydrated. She was + 1.7 L and up 10 lbs. D/c weight 157 lbs. Lasix dose decreased to 40 mg BID  She returns today for regular follow up.  At last visit was thought to be overloaded and lasix increased, but BMET resulted with elevated creatinine and had to be held for several days instead. Order faxed to rehab center for repeat BMET but never received any results (at least that are showing up in the computer). Weight down 6 lbs from last visit.  Has been feeling better, except for her continued joint pain.   She is at home from rehab now.  Breathing is "pretty good". Gets around the house a little  better, now that she has finished PT. No longer using CPAP, states when she lost weight she was told it was OK not to use.  Taking all medications as directed. Has not needed the metolazone over the past two weeks since she has been home. Weight at home 156.6.  Labs 11/16 K 4.1, Creatinine 2.01=> 1.89 Labs 12/09/15 K 2.7, Cr 1.79, Calcium > 15.0 (Admitted) Labs 12/12/15 K 4.1, Creatinine 1.26  RHC 03/21/15 RA = 14 RV = 60/10/14 PA = 65/30 (43) PCW = 20 Fick cardiac output/index = 3.8/2.1 PVR = 6.1 WU Arterial sat = 94% PA sat = 55%, 56%  Echo 03/17/15 EF 60-65%, RV severely dilated with moderately reduced systolic function. PA peak pressure 68 mm Hg.  ROS: All systems negative except as listed in HPI, PMH and Problem List.  SH:  Social History   Social History  . Marital Status: Married    Spouse Name: N/A  . Number of Children: 2  . Years of Education: N/A   Occupational History  . retired     Social History Main Topics  . Smoking status: Former Smoker    Quit date: 08/13/1978  . Smokeless tobacco: Never Used  . Alcohol Use: No  . Drug Use: No  . Sexual Activity: No   Other Topics Concern  . Not on file   Social History Narrative   Lives with husband       FH:  Family History  Problem Relation Age of Onset  . Pneumonia Mother   . Heart disease Mother   . Allergies Mother   . Emphysema Mother   . Arthritis Mother   . Hypertension Mother   . Heart attack Father   . Hypertension Father   . Diabetes Sister   . Heart disease Sister   . Diabetes Brother   . Diabetes      grandfather  . Breast cancer      aunt  . Bone cancer      aunt  . Clotting disorder Brother   . Arthritis Sister   . Breast cancer Maternal Aunt     Past Medical History  Diagnosis Date  . CAD (coronary artery disease)   . CHF (congestive heart failure) ([place])   . Paroxysmal atrial fibrillation (HCC)   . Bradycardia   . Hypertension   . Renal artery stenosis ([place])   . COPD  (chronic obstructive pulmonary disease) ([place])   . Obesity hypoventilation syndrome ([place])   . Pruritus   . Tracheobronchitis   . Acute rhinosinusitis   . Anemia   . Hypoxemia   . Peripheral edema   . Obesity, endogenous   . Diabetes mellitus   . Acid reflux disease   . Osteoarthritis, generalized   . Hypothyroidism   . Lung nodule     lingula  . OSA (obstructive sleep apnea)     NPSG 04/03/00--AHI 28.hr  . DVT (deep venous thrombosis) ([place]) 2003    left leg  . Vaginal atrophy   . Hx of colonic polyps   . Acute diastolic heart failure ([place])   . Secondary pulmonary hypertension ([place]) 06/09/2013  . Blood transfusion without reported diagnosis 2009    had internal bleeding  . Cancer ([place]) 1959    ovarian cancer  . Osteoporosis   . Stroke ([place])   . Chronic pain     Current Outpatient Prescriptions  Medication Sig Dispense Refill  . acetaminophen (TYLENOL) 500 MG tablet Take 1,000 mg by mouth every 6 (six) hours as needed for mild pain. Reported on 12/22/2015    . albuterol (PROVENTIL HFA;VENTOLIN HFA) 108 (90 BASE) MCG/ACT inhaler Inhale 1 puff into the lungs every 4 (four) hours as needed for wheezing or shortness of breath.    Marland Kitchen albuterol (PROVENTIL) (2.5 MG/3ML) 0.083% nebulizer solution Take 3 mLs (2.5 mg total) by nebulization every 2 (two) hours as needed for wheezing or shortness of breath. 75 mL 3  . allopurinol (ZYLOPRIM) 100 MG tablet Take 100 mg by mouth daily.    Marland Kitchen ALPRAZolam (XANAX) 0.5 MG tablet take 1 tablet by mouth every 8 hours if needed  0  . amLODipine (NORVASC) 2.5 MG tablet     . apixaban (ELIQUIS) 2.5 MG TABS tablet Take 2.5 mg by mouth 2 (two) times daily.    Marland Kitchen azelastine (ASTELIN) 137 MCG/SPRAY nasal spray 1-2 sprays each nostril once or twice daily 90 mL 3  . bisacodyl (DULCOLAX) 5 MG EC tablet Take 5 mg by mouth daily as needed for moderate constipation.    . budesonide (PULMICORT) 0.25 MG/2ML nebulizer solution Take 2 mLs (0.25 mg total) by nebulization  daily. 60 mL prn  . colchicine 0.6 MG tablet Take 1.2 mg by mouth daily as needed (for gout flareups).     . CRESTOR 20 MG tablet Take 20 mg by mouth daily.     Marland Kitchen diltiazem (TIAZAC) 120 MG 24 hr capsule Take 120 mg by mouth  daily.    . diphenhydrAMINE (BENADRYL) 25 MG tablet Take 25 mg by mouth at bedtime as needed for sleep.    . furosemide (LASIX) 40 MG tablet Take 60 mg by mouth 2 (two) times daily.    Marland Kitchen guaiFENesin (MUCINEX) 600 MG 12 hr tablet Take 600 mg by mouth daily.    . insulin glargine (LANTUS) 100 UNIT/ML injection Inject 20-35 Units into the skin 2 (two) times daily. Sliding scale    . insulin lispro (HUMALOG) 100 UNIT/ML injection Inject 3 Units into the skin 2 (two) times daily with breakfast and lunch. Per sliding scale. Patient unsure about how much she really takes    . isosorbide mononitrate (IMDUR) 30 MG 24 hr tablet Take 30 mg by mouth daily.    Marland Kitchen KLOR-CON M20 20 MEQ tablet     . levothyroxine (SYNTHROID, LEVOTHROID) 25 MCG tablet Take 25 mcg by mouth See admin instructions. Take 1 tablet (25 mcg) on MON, WED, FRI, SUN    . levothyroxine (SYNTHROID, LEVOTHROID) 50 MCG tablet Take 50 mcg by mouth See admin instructions. Take 50mg  on Tues, Thurs, Sat    . metolazone (ZAROXOLYN) 2.5 MG tablet Take 1 tablet (2.5 mg total) by mouth as needed (for 160 lb or greater). 90 tablet 3  . metoprolol (LOPRESSOR) 50 MG tablet     . nitroGLYCERIN (NITROSTAT) 0.4 MG SL tablet Place 0.4 mg under the tongue every 5 (five) minutes as needed for chest pain.     Marland Kitchen ondansetron (ZOFRAN) 4 MG tablet Take 1 tablet (4 mg total) by mouth every 8 (eight) hours as needed for nausea or vomiting. 30 tablet 0  . oxybutynin (DITROPAN XL) 10 MG 24 hr tablet Take 10 mg by mouth daily. Reported on 01/24/2016    . Potassium Chloride ER 20 MEQ TBCR Take 10 mEq by mouth daily. Take an extra 40 meq when you take Metolazone 30 tablet 3  . traMADol (ULTRAM) 50 MG tablet Take 50 mg by mouth every 6 (six) hours as needed  for moderate pain.      No current facility-administered medications for this encounter.    Filed Vitals:   01/25/16 1347  BP: 138/68  Pulse: 88  Weight: 159 lb (72.122 kg)  SpO2: 91%   Wt Readings from Last 3 Encounters:  01/25/16 159 lb (72.122 kg)  01/24/16 158 lb 3.2 oz (71.759 kg)  12/22/15 165 lb 8 oz (75.07 kg)     PHYSICAL EXAM:  General:  Elderly appearing. No resp distress. HEENT: normal. On 2 liters .  Neck: supple. JVP ~7-8 cm. Carotids 2+ bilaterally; no bruits. No thyromegaly or nodule noted. Cor: PMI normal. Irregular rhythm. No M/G/R appreciated Lungs: Very slight crackles in bases Abdomen: obese, soft, NT, ND, no HSM. No bruits or masses. +BS  Extremities: no cyanosis, clubbing, rash. Trace ankle edema.  Marked varicose veins. Neuro: alert & orientedx3, cranial nerves grossly intact. Moves all 4 extremities w/o difficulty. Affect pleasant.  ASSESSMENT & PLAN: 1. Chronic diastolic CHF/right heart failure: Echo 03/17/15 EF 60-65%, RV severely dilated with moderately reduced systolic function.  NYHA class II-III symptoms.  Volume status looks stable on exam.  - Continue Lasix 60 mg bid.  Check BMET today.  - Continue metolazone 2.5 mg as needed for weights > 160 lbs. Should take an addition 40 meq potassium when given metolazone.  - Reinforced daily weights, limiting fluids and making low salt food choices.  2. Pulmonary Hypertension: PA peak pressure 68 mm  Hg last echo. RHC PA 65/30 (43). Had [place] 03/2015 with PVR 6. Suspect mostly group 3 pulmonary hypertension due to chronic lung disease. Will continue to follow. Will not start pulmonary vasodilator at this point. Continue home oxygen and CPAP at night.  3. COPD: On chronic 2 liters 02 4. A fib: Chronic.  Remains in A fib by exam Continue Eliquis 2.5 mg twice daily. Rate controlled on metoprolol and cardizem.  5. H/o CAD s/p stenting of LAD 2008.  No evidence of ischemia.  6. CKD stage IV: Check BMET today.  7.  OHS/OSA: currently using CPAP nightly and oxygen during the day. 8. Gout  - Can use prednisone 40 mg daily for 3-4 days for acute gout as needed.  BMET today.  No med changes as she is stable on current regimen.  Follow up in 2 months.   Satira Mccallum Tillery PA-C 01/25/2016

## 2016-01-24 NOTE — Progress Notes (Signed)
STROKE NEUROLOGY FOLLOW UP NOTE  NAME: [name] DOB: 16-Apr-1934  REASON FOR VISIT: stroke follow up HISTORY FROM: pt and chart  Today we had the pleasure of seeing Lima L Mcclintock in follow-up at our [facility]. Pt was accompanied by husband.   History Summary [name] is an 82 y.o. female with a history of atrial fibrillation not on AC, DM, obesity, OSA, previous DVT, COPD and pulmonary hypertension on home O2 was admitted on 12/04/14 due to transient expressive aphasia. She denied any weakness.Initial NIHSS = 5. MRI no infarct. LDL 211 and A1C 7.3. She was put on eliquis. Due to intolerance, she was advised for consideration of PCSK9 inhibitors. She was discharged in good condition.   03/03/15 follow up - the patient has been doing well from stroke standpoint. No recurrent symptoms. However, she does have increased SOB and O2 from 2L/m increased to 3L/m. She has recent infection and was put on Abx. Her crestor was resumed and she said the muscle pain is tolerable. BP today 132/71.    06/13/15 follow up - Pt has been doing well, no recurrent stroke like symptoms. She is still on portable O2 therapy. Stated that her glucose in control and at home 153, however, her A1C in 03/2014 was 9.5. LDL 211 and on crestor 20mg . Continued on eliquis, cardizem and metoprolol for afib. BP today 128/81.   Interval History During the interval time, pt has been doing well. No recurrent stroke like symptoms. Glucose at home 110-120 but has not checked A1C or lipid panel yet. Continued on eliquis, cardizem and metoprolol for afib. BP today 123/61. Still on home O2 and wheelchair, follows with Dr. Annamaria Boots.   REVIEW OF SYSTEMS: Full 14 system review of systems performed and notable only for those listed below and in HPI above, all others are negative:  Constitutional:   Cardiovascular:  Ear/Nose/Throat:  Ringing in ears Skin: itching Eyes:   Respiratory:   Gastroitestinal:   Constipation, nausea Genitourinary:  Hematology/Lymphatic:   Endocrine:  Musculoskeletal:  aching muscles, joint pain, back pain, walking difficulty Allergy/Immunology:   Neurological:   Psychiatric:  Sleep: apnea, restless leg  The following represents the patient's updated allergies and side effects list: Allergies  Allergen Reactions  . Cefuroxime Axetil Shortness Of Breath, Swelling and Rash    Throat swelling  . Cephalexin Shortness Of Breath, Swelling and Rash    Throat swelling  . Clarithromycin Shortness Of Breath, Swelling and Rash    Throat swelling  . Doxycycline Shortness Of Breath, Swelling and Rash     rash on legs and feet, throat swelling  . Erythromycin Shortness Of Breath, Swelling and Rash    Throat swelling  . Tetracycline Shortness Of Breath, Swelling and Rash    Throat swelling  . Bactrim [Sulfamethoxazole-Trimethoprim] Hives    The neurologically relevant items on the patient's problem list were reviewed on today's visit.  Neurologic Examination  A problem focused neurological exam (12 or more points of the single system neurologic examination, vital signs counts as 1 point, cranial nerves count for 8 points) was performed.  Blood pressure 123/61, pulse 57, weight 158 lb 3.2 oz (71.759 kg), last menstrual period 08/13/1957.  General - Well nourished, well developed, in mild respiratory distress, on home O2.  Ophthalmologic - Fundi not visualized due to noncorporation due to SOB.  Cardiovascular - irregularly irregular heart rate and rhythm.  Mental Status -  Level of arousal and orientation to time, place, and person  were intact. Language including expression, naming, repetition, comprehension was assessed and found intact. Fund of Knowledge was assessed and was intact.  Cranial Nerves II - XII - II - Visual field intact OU. III, IV, VI - Extraocular movements intact. V - Facial sensation intact bilaterally. VII - Facial movement intact  bilaterally. VIII - Hearing & vestibular intact bilaterally. X - Palate elevates symmetrically. XI - Chin turning & shoulder shrug intact bilaterally. XII - Tongue protrusion intact.  Motor Strength - The patient's strength was 4/5 in all extremities and pronator drift was absent.  Bulk was normal and fasciculations were absent.   Motor Tone - Muscle tone was assessed at the neck and appendages and was normal.  Reflexes - The patient's reflexes were 1+ in all extremities and she had no pathological reflexes.  Sensory - Light touch, temperature/pinprick were assessed and were normal.    Coordination - The patient had normal movements in the hands with no ataxia or dysmetria.  Tremor was absent.  Gait and Station - in wheelchair, not tested.  Data reviewed: I personally reviewed the images and agree with the radiology interpretations.  Ct Head (brain) Wo Contrast 12/04/2014  No definite acute cortical infarction. Chronic changes as described. Possible hyperattenuating LEFT middle cerebral artery M3 branch or branches could be indicative of distal emboli.   MRI HEAD IMPRESSION:  12/05/2014 1. No acute intracranial infarct or other abnormality identified.  2. Generalized cerebral atrophy with moderate chronic microvascular ischemic disease.   MRA HEAD IMPRESSION:  12/05/2014 1. No proximal branch occlusion or hemodynamically significant stenosis identified within the intracranial circulation.  2. Nonvisualization of the left vertebral artery, which may be hypoplastic or occluded in the neck. Dominant right vertebral artery widely patent.  3. Fetal origin of the right PCA.   Carotid Ultrasound - Bilateral 1-39% ICA stenosis, borderline >40%. Calcific plaque is present at bilateral origin and velocities may be underestimated. Bilateral ECA stenosis. Right vertebral patent with antegrade flow. Left vertebral is atypical with loss of diastolic component, suggesting a distal  obstruction.  2D echo - Normal LV function; moderate LVH; moderate LAE; mild AI; mild TR.  Component     Latest Ref Rng 12/05/2014  Cholesterol     0 - 200 mg/dL 175 (H)  Triglycerides     <150 mg/dL 102 (H)  HDL Cholesterol     >39 mg/dL 28 (L)  Total CHOL/HDL Ratio      10.4  VLDL     0 - 40 mg/dL 52 (H)  LDL (calc)     0 - 99 mg/dL 585 (H)  Hemoglobin I7P     4.8 - 5.6 % 7.3 (H)  Mean Plasma Glucose      163    Assessment: As you may recall, she is a 82 y.o. Caucasian female with PMH of afib not on AC, DM, obesity, OSA, previous DVT, and pulmonary hypertension on home O2 was admitted on 12/04/14 due to transient expressive aphasia. MRI no infarct. LDL 211 and A1C 7.3. She was put on eliquis and crestor on discharge. She is tolerating meds well. Still has SOB on home O2 due to PHTN, COPD. Aggressive risk factor control. Last A1C in 03/2015 was 9.5. BP in good control. No side effect from eliquis. No recent A1C or lipid panel. Pt has PCP appointment in 03/2016  Plan:  - continue eliquis and crestor for stroke prevention - check BP and glucose at home - continue to follow up with cardiology for  CHF and afib. - discuss with Dr. Addison Lank to check lipid panel and A1C next visit  - Follow up with your primary care physician for stroke risk factor modification. Recommend maintain blood pressure goal <130/80, diabetes with hemoglobin A1c goal below 6.5% and lipids with LDL cholesterol goal below 70 mg/dL.  - follow up with Dr. Annamaria Boots for COPD and O2 therapy - bleeding precautions. - follow up in one year.    No orders of the defined types were placed in this encounter.    Meds ordered this encounter  Medications  . ALPRAZolam (XANAX) 0.5 MG tablet    Sig: take 1 tablet by mouth every 8 hours if needed    Refill:  0  . amLODipine (NORVASC) 2.5 MG tablet    Sig:   . KLOR-CON M20 20 MEQ tablet    Sig:   . metoprolol (LOPRESSOR) 50 MG tablet    Sig:     Patient Instructions  -  continue eliquis and crestor for stroke prevention - check BP and glucose at home - continue to follow up with cardiology for CHF and afib. - discuss with Dr. Addison Lank to check lipid panel and A1C next visit  - Follow up with your primary care physician for stroke risk factor modification. Recommend maintain blood pressure goal <130/80, diabetes with hemoglobin A1c goal below 6.5% and lipids with LDL cholesterol goal below 70 mg/dL.  - follow up with Dr. Annamaria Boots for COPD and O2 therapy - bleeding precautions. - follow up in one year.     Rosalin Hawking, MD PhD [facility] [address] [place], [place] [address] [phone]

## 2016-01-24 NOTE — Patient Instructions (Addendum)
-   continue eliquis and crestor for stroke prevention - check BP and glucose at home - continue to follow up with cardiology for CHF and afib. - discuss with Dr. Addison Lank to check lipid panel and A1C next visit  - Follow up with your primary care physician for stroke risk factor modification. Recommend maintain blood pressure goal <130/80, diabetes with hemoglobin A1c goal below 6.5% and lipids with LDL cholesterol goal below 70 mg/dL.  - follow up with Dr. Annamaria Boots for COPD and O2 therapy - bleeding precautions. - follow up in one year.

## 2016-01-25 ENCOUNTER — Ambulatory Visit (HOSPITAL_COMMUNITY)
Admission: RE | Admit: 2016-01-25 | Discharge: 2016-01-25 | Disposition: A | Discharge status: Home / Self Care | Payer: Medicare Other | Source: Ambulatory Visit | Attending: Internal Medicine | Admitting: Internal Medicine

## 2016-01-25 VITALS — BP 138/68 | HR 88 | Wt 159.0 lb

## 2016-01-25 DIAGNOSIS — I482 Chronic atrial fibrillation, unspecified: Secondary | ICD-10-CM

## 2016-01-25 DIAGNOSIS — I5033 Acute on chronic diastolic (congestive) heart failure: Secondary | ICD-10-CM | POA: Diagnosis present

## 2016-01-25 DIAGNOSIS — Z8543 Personal history of malignant neoplasm of ovary: Secondary | ICD-10-CM | POA: Insufficient documentation

## 2016-01-25 DIAGNOSIS — G9340 Encephalopathy, unspecified: Secondary | ICD-10-CM | POA: Diagnosis not present

## 2016-01-25 DIAGNOSIS — M109 Gout, unspecified: Secondary | ICD-10-CM | POA: Insufficient documentation

## 2016-01-25 DIAGNOSIS — I2510 Atherosclerotic heart disease of native coronary artery without angina pectoris: Secondary | ICD-10-CM | POA: Diagnosis not present

## 2016-01-25 DIAGNOSIS — E1122 Type 2 diabetes mellitus with diabetic chronic kidney disease: Secondary | ICD-10-CM | POA: Insufficient documentation

## 2016-01-25 DIAGNOSIS — K219 Gastro-esophageal reflux disease without esophagitis: Secondary | ICD-10-CM | POA: Insufficient documentation

## 2016-01-25 DIAGNOSIS — Z7901 Long term (current) use of anticoagulants: Secondary | ICD-10-CM | POA: Diagnosis not present

## 2016-01-25 DIAGNOSIS — Z87891 Personal history of nicotine dependence: Secondary | ICD-10-CM | POA: Diagnosis not present

## 2016-01-25 DIAGNOSIS — E039 Hypothyroidism, unspecified: Secondary | ICD-10-CM | POA: Insufficient documentation

## 2016-01-25 DIAGNOSIS — I130 Hypertensive heart and chronic kidney disease with heart failure and stage 1 through stage 4 chronic kidney disease, or unspecified chronic kidney disease: Secondary | ICD-10-CM | POA: Insufficient documentation

## 2016-01-25 DIAGNOSIS — Z794 Long term (current) use of insulin: Secondary | ICD-10-CM | POA: Insufficient documentation

## 2016-01-25 DIAGNOSIS — N184 Chronic kidney disease, stage 4 (severe): Secondary | ICD-10-CM | POA: Diagnosis not present

## 2016-01-25 DIAGNOSIS — M1990 Unspecified osteoarthritis, unspecified site: Secondary | ICD-10-CM | POA: Diagnosis not present

## 2016-01-25 DIAGNOSIS — Z79899 Other long term (current) drug therapy: Secondary | ICD-10-CM | POA: Diagnosis not present

## 2016-01-25 DIAGNOSIS — Z8673 Personal history of transient ischemic attack (TIA), and cerebral infarction without residual deficits: Secondary | ICD-10-CM | POA: Insufficient documentation

## 2016-01-25 DIAGNOSIS — I272 Other secondary pulmonary hypertension: Secondary | ICD-10-CM | POA: Diagnosis not present

## 2016-01-25 DIAGNOSIS — I5032 Chronic diastolic (congestive) heart failure: Secondary | ICD-10-CM | POA: Diagnosis not present

## 2016-01-25 DIAGNOSIS — Z833 Family history of diabetes mellitus: Secondary | ICD-10-CM | POA: Insufficient documentation

## 2016-01-25 DIAGNOSIS — N179 Acute kidney failure, unspecified: Secondary | ICD-10-CM | POA: Diagnosis not present

## 2016-01-25 DIAGNOSIS — Z7951 Long term (current) use of inhaled steroids: Secondary | ICD-10-CM | POA: Diagnosis not present

## 2016-01-25 DIAGNOSIS — Z8249 Family history of ischemic heart disease and other diseases of the circulatory system: Secondary | ICD-10-CM | POA: Insufficient documentation

## 2016-01-25 DIAGNOSIS — Z955 Presence of coronary angioplasty implant and graft: Secondary | ICD-10-CM | POA: Diagnosis not present

## 2016-01-25 DIAGNOSIS — Z9981 Dependence on supplemental oxygen: Secondary | ICD-10-CM | POA: Diagnosis not present

## 2016-01-25 DIAGNOSIS — G8929 Other chronic pain: Secondary | ICD-10-CM | POA: Diagnosis not present

## 2016-01-25 DIAGNOSIS — G4733 Obstructive sleep apnea (adult) (pediatric): Secondary | ICD-10-CM | POA: Diagnosis not present

## 2016-01-25 DIAGNOSIS — J449 Chronic obstructive pulmonary disease, unspecified: Secondary | ICD-10-CM | POA: Insufficient documentation

## 2016-01-25 DIAGNOSIS — N189 Chronic kidney disease, unspecified: Secondary | ICD-10-CM

## 2016-01-25 DIAGNOSIS — I10 Essential (primary) hypertension: Secondary | ICD-10-CM

## 2016-01-25 LAB — BASIC METABOLIC PANEL
ANION GAP: 11 (ref 5–15)
BUN: 60 mg/dL — ABNORMAL HIGH (ref 6–20)
CALCIUM: 9.6 mg/dL (ref 8.9–10.3)
CHLORIDE: 94 mmol/L — AB (ref 101–111)
CO2: 28 mmol/L (ref 22–32)
CREATININE: 1.87 mg/dL — AB (ref 0.44–1.00)
GFR calc Af Amer: 28 mL/min — ABNORMAL LOW (ref >60)
GFR calc non Af Amer: 24 mL/min — ABNORMAL LOW (ref >60)
Glucose, Bld: 301 mg/dL — ABNORMAL HIGH (ref 65–99)
POTASSIUM: 3.7 mmol/L (ref 3.5–5.1)
Sodium: 133 mmol/L — ABNORMAL LOW (ref 135–145)

## 2016-02-22 ENCOUNTER — Emergency Department (HOSPITAL_COMMUNITY): Payer: Medicare Other

## 2016-02-22 ENCOUNTER — Emergency Department (HOSPITAL_COMMUNITY)
Admission: EM | Admit: 2016-02-22 | Discharge: 2016-02-22 | Disposition: A | Discharge status: Home / Self Care | Payer: Medicare Other | Attending: Emergency Medicine | Admitting: Emergency Medicine

## 2016-02-22 DIAGNOSIS — Z7901 Long term (current) use of anticoagulants: Secondary | ICD-10-CM | POA: Insufficient documentation

## 2016-02-22 DIAGNOSIS — Z8673 Personal history of transient ischemic attack (TIA), and cerebral infarction without residual deficits: Secondary | ICD-10-CM | POA: Diagnosis not present

## 2016-02-22 DIAGNOSIS — I5031 Acute diastolic (congestive) heart failure: Secondary | ICD-10-CM | POA: Insufficient documentation

## 2016-02-22 DIAGNOSIS — Y999 Unspecified external cause status: Secondary | ICD-10-CM | POA: Diagnosis not present

## 2016-02-22 DIAGNOSIS — Z87891 Personal history of nicotine dependence: Secondary | ICD-10-CM | POA: Insufficient documentation

## 2016-02-22 DIAGNOSIS — Z8543 Personal history of malignant neoplasm of ovary: Secondary | ICD-10-CM | POA: Insufficient documentation

## 2016-02-22 DIAGNOSIS — Y929 Unspecified place or not applicable: Secondary | ICD-10-CM | POA: Diagnosis not present

## 2016-02-22 DIAGNOSIS — W1830XA Fall on same level, unspecified, initial encounter: Secondary | ICD-10-CM | POA: Insufficient documentation

## 2016-02-22 DIAGNOSIS — I2510 Atherosclerotic heart disease of native coronary artery without angina pectoris: Secondary | ICD-10-CM | POA: Insufficient documentation

## 2016-02-22 DIAGNOSIS — Z794 Long term (current) use of insulin: Secondary | ICD-10-CM | POA: Insufficient documentation

## 2016-02-22 DIAGNOSIS — Y939 Activity, unspecified: Secondary | ICD-10-CM | POA: Insufficient documentation

## 2016-02-22 DIAGNOSIS — N3000 Acute cystitis without hematuria: Secondary | ICD-10-CM | POA: Insufficient documentation

## 2016-02-22 DIAGNOSIS — J449 Chronic obstructive pulmonary disease, unspecified: Secondary | ICD-10-CM | POA: Insufficient documentation

## 2016-02-22 DIAGNOSIS — I83813 Varicose veins of bilateral lower extremities with pain: Secondary | ICD-10-CM | POA: Diagnosis not present

## 2016-02-22 DIAGNOSIS — E119 Type 2 diabetes mellitus without complications: Secondary | ICD-10-CM | POA: Insufficient documentation

## 2016-02-22 DIAGNOSIS — R350 Frequency of micturition: Secondary | ICD-10-CM | POA: Diagnosis present

## 2016-02-22 DIAGNOSIS — I110 Hypertensive heart disease with heart failure: Secondary | ICD-10-CM | POA: Insufficient documentation

## 2016-02-22 LAB — URINALYSIS, ROUTINE W REFLEX MICROSCOPIC
Bilirubin Urine: NEGATIVE
GLUCOSE, UA: NEGATIVE mg/dL
Hgb urine dipstick: NEGATIVE
Ketones, ur: NEGATIVE mg/dL
NITRITE: POSITIVE — AB
PROTEIN: 30 mg/dL — AB
Specific Gravity, Urine: 1.013 (ref 1.005–1.030)
pH: 6 (ref 5.0–8.0)

## 2016-02-22 LAB — BASIC METABOLIC PANEL
ANION GAP: 10 (ref 5–15)
BUN: 36 mg/dL — ABNORMAL HIGH (ref 6–20)
CO2: 28 mmol/L (ref 22–32)
Calcium: 10.3 mg/dL (ref 8.9–10.3)
Chloride: 97 mmol/L — ABNORMAL LOW (ref 101–111)
Creatinine, Ser: 1.72 mg/dL — ABNORMAL HIGH (ref 0.44–1.00)
GFR, EST AFRICAN AMERICAN: 31 mL/min — AB (ref >60)
GFR, EST NON AFRICAN AMERICAN: 27 mL/min — AB (ref >60)
Glucose, Bld: 169 mg/dL — ABNORMAL HIGH (ref 65–99)
POTASSIUM: 4.7 mmol/L (ref 3.5–5.1)
SODIUM: 135 mmol/L (ref 135–145)

## 2016-02-22 LAB — CBC WITH DIFFERENTIAL/PLATELET
BASOS ABS: 0 10*3/uL (ref 0.0–0.1)
BASOS PCT: 0 %
Eosinophils Absolute: 0.1 10*3/uL (ref 0.0–0.7)
Eosinophils Relative: 2 %
HEMATOCRIT: 41.2 % (ref 36.0–46.0)
Hemoglobin: 13.1 g/dL (ref 12.0–15.0)
LYMPHS PCT: 13 %
Lymphs Abs: 0.9 10*3/uL (ref 0.7–4.0)
MCH: 31.5 pg (ref 26.0–34.0)
MCHC: 31.8 g/dL (ref 30.0–36.0)
MCV: 99 fL (ref 78.0–100.0)
Monocytes Absolute: 0.6 10*3/uL (ref 0.1–1.0)
Monocytes Relative: 9 %
NEUTROS ABS: 5.2 10*3/uL (ref 1.7–7.7)
NEUTROS PCT: 76 %
PLATELETS: 215 10*3/uL (ref 150–400)
RBC: 4.16 MIL/uL (ref 3.87–5.11)
RDW: 15.2 % (ref 11.5–15.5)
WBC: 6.9 10*3/uL (ref 4.0–10.5)

## 2016-02-22 LAB — URINE MICROSCOPIC-ADD ON

## 2016-02-22 LAB — I-STAT CG4 LACTIC ACID, ED: Lactic Acid, Venous: 1.19 mmol/L (ref 0.5–1.9)

## 2016-02-22 MED ORDER — AMOXICILLIN-POT CLAVULANATE 500-125 MG PO TABS
1.0000 | ORAL_TABLET | Freq: Once | ORAL | Status: AC
  Administered 2016-02-22: 500 mg via ORAL
  Filled 2016-02-22 (×2): qty 1

## 2016-02-22 MED ORDER — SODIUM CHLORIDE 0.9 % IV BOLUS (SEPSIS)
250.0000 mL | Freq: Once | INTRAVENOUS | Status: AC
  Administered 2016-02-22: 250 mL via INTRAVENOUS

## 2016-02-22 MED ORDER — AMOXICILLIN-POT CLAVULANATE 500-125 MG PO TABS: 1.0000 | ORAL_TABLET | Freq: Two times a day (BID) | ORAL | Status: DC

## 2016-02-22 NOTE — Discharge Instructions (Signed)
You have been seen today for urinary frequency. There was evidence of an urinary tract infection on urinalysis. Please take all of your antibiotics until finished!   You may develop abdominal discomfort or diarrhea from the antibiotic.  You may help offset this with probiotics which you can buy or get in yogurt. Do not eat or take the probiotics until 2 hours after your antibiotic. Follow up with PCP as needed should symptoms continue. Return to ED should symptoms worsen.

## 2016-02-22 NOTE — ED Notes (Signed)
Pt arrives from home via [facility] who report pt c/o increasing weakness for 10 days.  EMS reports family reported pt diagnosed with UTI 2 weeks ago, symptoms not improved.  AOx4, NAD noted at this time.

## 2016-02-22 NOTE — ED Notes (Signed)
Pt ambulated in hallway with walker with no complaints.

## 2016-02-22 NOTE — ED Provider Notes (Signed)
CSN: [id number]     Arrival date & time 02/22/16  1412 History   First MD Initiated Contact with Patient 02/22/16 1414     Chief Complaint  Patient presents with  . Urinary Frequency  . Weakness     (Consider location/radiation/quality/duration/timing/severity/associated sxs/prior Treatment) HPI   [name] is a 82 y.o. female, with a history of CAD, CHF, COPD, HTN, chronic back pain, and DM, presenting to the ED with urinary frequency and urinary incontinence, increasing over the last week. Patient also complains of increasing pain with ambulation in both her legs along with generalized weakness. Pain is throughout both legs. Patient endorses a fall 3 days ago due to her right hip "giving out." Patient denies head trauma or LOC. Pain is currently moderate and achy. Patient has not taken any medications for her symptoms. Patient wears 2 L of home O2 continuously. Patient denies fever/chills, abdominal pain, shortness of breath, chest pain, nausea/vomiting, neuro deficits, vaginal discharge or bleeding, increased back pain, neck pain or any other complaints.      Past Medical History  Diagnosis Date  . CAD (coronary artery disease)   . CHF (congestive heart failure) ([place])   . Paroxysmal atrial fibrillation (HCC)   . Bradycardia   . Hypertension   . Renal artery stenosis ([place])   . COPD (chronic obstructive pulmonary disease) ([place])   . Obesity hypoventilation syndrome ([place])   . Pruritus   . Tracheobronchitis   . Acute rhinosinusitis   . Anemia   . Hypoxemia   . Peripheral edema   . Obesity, endogenous   . Diabetes mellitus   . Acid reflux disease   . Osteoarthritis, generalized   . Hypothyroidism   . Lung nodule     lingula  . OSA (obstructive sleep apnea)     NPSG 04/03/00--AHI 28.hr  . DVT (deep venous thrombosis) ([place]) 2003    left leg  . Vaginal atrophy   . Hx of colonic polyps   . Acute diastolic heart failure ([place])   . Secondary pulmonary hypertension  ([place]) 06/09/2013  . Blood transfusion without reported diagnosis 2009    had internal bleeding  . Cancer ([place]) 1959    ovarian cancer  . Osteoporosis   . Stroke ([place])   . Chronic pain    Past Surgical History  Procedure Laterality Date  . Lumbar spine surgery    . Cholecystectomy    . Hemorroidectomy    . Repair wound fistula    . Right shoulder    . Elbow surgery    . Dilation and curettage of uterus    . Breast surgery  1998    lumpectomy  . Oophorectomy  1959    BSO-? ovarian cancer  . Bilateral salpingoophorectomy  1959    -d/t ovarian cancer  . Rotator cuff repair Right   . Cardiac catheterization N/A 03/21/2015    Procedure: Right Heart Cath;  Surgeon: Jolaine Artist, MD;  Location: [facility];  Service: Cardiovascular;  Laterality: N/A;   Family History  Problem Relation Age of Onset  . Pneumonia Mother   . Heart disease Mother   . Allergies Mother   . Emphysema Mother   . Arthritis Mother   . Hypertension Mother   . Heart attack Father   . Hypertension Father   . Diabetes Sister   . Heart disease Sister   . Diabetes Brother   . Diabetes      grandfather  . Breast cancer  aunt  . Bone cancer      aunt  . Clotting disorder Brother   . Arthritis Sister   . Breast cancer Maternal Aunt    Social History  Substance Use Topics  . Smoking status: Former Smoker    Quit date: 08/13/1978  . Smokeless tobacco: Never Used  . Alcohol Use: No   OB History    Gravida Para Term Preterm AB TAB SAB Ectopic Multiple Living   0               Obstetric Comments   2 adopted children     Review of Systems  Constitutional: Negative for fever, chills and diaphoresis.  Respiratory: Negative for shortness of breath.   Cardiovascular: Negative for chest pain.  Gastrointestinal: Negative for abdominal pain.  Genitourinary: Positive for frequency. Negative for hematuria, flank pain, vaginal bleeding, vaginal discharge, difficulty urinating and pelvic  pain.  Musculoskeletal: Positive for myalgias (bilateral lower extremities) and arthralgias (Bilateral knees and ankles). Negative for back pain and neck pain.  Skin: Negative for color change and pallor.  Neurological: Negative for dizziness, syncope, weakness, light-headedness and headaches.  All other systems reviewed and are negative.     Allergies  Cefuroxime axetil; Cephalexin; Clarithromycin; Doxycycline; Erythromycin; Tetracycline; and Bactrim  Home Medications   Prior to Admission medications   Medication Sig Start Date End Date Taking? Authorizing Provider  acetaminophen (TYLENOL) 500 MG tablet Take 1,000 mg by mouth every 6 (six) hours as needed for mild pain. Reported on 12/22/2015    Historical Provider, MD  albuterol (PROVENTIL HFA;VENTOLIN HFA) 108 (90 BASE) MCG/ACT inhaler Inhale 1 puff into the lungs every 4 (four) hours as needed for wheezing or shortness of breath.    Historical Provider, MD  albuterol (PROVENTIL) (2.5 MG/3ML) 0.083% nebulizer solution Take 3 mLs (2.5 mg total) by nebulization every 2 (two) hours as needed for wheezing or shortness of breath. 03/25/15   Shanker Kristeen Mans, MD  allopurinol (ZYLOPRIM) 100 MG tablet Take 100 mg by mouth daily.    Historical Provider, MD  ALPRAZolam Duanne Moron) 0.5 MG tablet take 1 tablet by mouth every 8 hours if needed 01/05/16   Historical Provider, MD  amLODipine (NORVASC) 2.5 MG tablet  01/19/16   Historical Provider, MD  amoxicillin-clavulanate (AUGMENTIN) 500-125 MG tablet Take 1 tablet (500 mg total) by mouth every 12 (twelve) hours. 02/22/16   Alvita Fana C Jarome Trull, PA-C  apixaban (ELIQUIS) 2.5 MG TABS tablet Take 2.5 mg by mouth 2 (two) times daily.    Historical Provider, MD  azelastine (ASTELIN) 137 MCG/SPRAY nasal spray 1-2 sprays each nostril once or twice daily 11/05/13   Deneise Lever, MD  bisacodyl (DULCOLAX) 5 MG EC tablet Take 5 mg by mouth daily as needed for moderate constipation.    Historical Provider, MD  budesonide  (PULMICORT) 0.25 MG/2ML nebulizer solution Take 2 mLs (0.25 mg total) by nebulization daily. 05/13/14   Deneise Lever, MD  colchicine 0.6 MG tablet Take 1.2 mg by mouth daily as needed (for gout flareups).     Historical Provider, MD  CRESTOR 20 MG tablet Take 20 mg by mouth daily.  06/10/15   Historical Provider, MD  diltiazem (TIAZAC) 120 MG 24 hr capsule Take 120 mg by mouth daily.    Historical Provider, MD  diphenhydrAMINE (BENADRYL) 25 MG tablet Take 25 mg by mouth at bedtime as needed for sleep.    Historical Provider, MD  furosemide (LASIX) 40 MG tablet Take 60 mg by  mouth 2 (two) times daily.    Historical Provider, MD  guaiFENesin (MUCINEX) 600 MG 12 hr tablet Take 600 mg by mouth daily.    Historical Provider, MD  insulin glargine (LANTUS) 100 UNIT/ML injection Inject 20-35 Units into the skin 2 (two) times daily. Sliding scale    Historical Provider, MD  insulin lispro (HUMALOG) 100 UNIT/ML injection Inject 3 Units into the skin 2 (two) times daily with breakfast and lunch. Per sliding scale. Patient unsure about how much she really takes    Historical Provider, MD  isosorbide mononitrate (IMDUR) 30 MG 24 hr tablet Take 30 mg by mouth daily.    Historical Provider, MD  KLOR-CON M20 20 MEQ tablet  01/19/16   Historical Provider, MD  levothyroxine (SYNTHROID, LEVOTHROID) 25 MCG tablet Take 25 mcg by mouth See admin instructions. Take 1 tablet (25 mcg) on MON, WED, FRI, SUN    Historical Provider, MD  levothyroxine (SYNTHROID, LEVOTHROID) 50 MCG tablet Take 50 mcg by mouth See admin instructions. Take 50mg  on Tues, Thurs, Sat    Historical Provider, MD  metolazone (ZAROXOLYN) 2.5 MG tablet Take 1 tablet (2.5 mg total) by mouth as needed (for 160 lb or greater). 12/22/15   Jolaine Artist, MD  metoprolol (LOPRESSOR) 50 MG tablet  01/18/16   Historical Provider, MD  nitroGLYCERIN (NITROSTAT) 0.4 MG SL tablet Place 0.4 mg under the tongue every 5 (five) minutes as needed for chest pain.      Historical Provider, MD  ondansetron (ZOFRAN) 4 MG tablet Take 1 tablet (4 mg total) by mouth every 8 (eight) hours as needed for nausea or vomiting. 07/19/15   Amy D Ninfa Meeker, NP  oxybutynin (DITROPAN XL) 10 MG 24 hr tablet Take 10 mg by mouth daily. Reported on 01/24/2016    Historical Provider, MD  Potassium Chloride ER 20 MEQ TBCR Take 10 mEq by mouth daily. Take an extra 40 meq when you take Metolazone 12/22/15   Jolaine Artist, MD  traMADol (ULTRAM) 50 MG tablet Take 50 mg by mouth every 6 (six) hours as needed for moderate pain.     Historical Provider, MD   BP 144/93 mmHg  Pulse 88  Temp(Src) 98.4 F (36.9 C) (Oral)  Resp 16  Ht 5' 1.25" (1.556 m)  Wt 67.586 kg  BMI 27.91 kg/m2  SpO2 95%  LMP 08/13/1957 Physical Exam  Constitutional: She appears well-developed and well-nourished. No distress.  HENT:  Head: Normocephalic and atraumatic.  Mouth/Throat: Oropharynx is clear and moist.  Eyes: Conjunctivae and EOM are normal. Pupils are equal, round, and reactive to light.  Neck: Normal range of motion. Neck supple.  Cardiovascular: Normal rate, regular rhythm, normal heart sounds and intact distal pulses.   Significant varicose and spider veins present in both lower extremities.  Pulmonary/Chest: Effort normal and breath sounds normal. No respiratory distress.  Abdominal: Soft. There is no tenderness. There is no guarding.  Musculoskeletal: She exhibits no edema or tenderness.  No discernible tenderness on exam of the lower extremities. No shortening or rotation noted. Passive ROM in lower extremities fully intact. No deformity or tenderness, swelling, or any other abnormalities and overall trauma exam reveals no abnormalities other than those listed. No paraspinal tenderness.  Lymphadenopathy:    She has no cervical adenopathy.  Neurological: She is alert. She has normal reflexes.  No sensory deficits. Strength 4 out of 5 in all 4 extremities. Coordination intact. Cranial nerves  III-XII grossly intact. No facial droop.   Skin:  Skin is warm and dry. She is not diaphoretic.  Psychiatric: She has a normal mood and affect. Her behavior is normal.  Nursing note and vitals reviewed.   ED Course  Procedures (including critical care time) Labs Review Labs Reviewed  URINALYSIS, ROUTINE W REFLEX MICROSCOPIC (NOT AT [facility]) - Abnormal; Notable for the following:    APPearance CLOUDY (*)    Protein, ur 30 (*)    Nitrite POSITIVE (*)    Leukocytes, UA MODERATE (*)    All other components within normal limits  BASIC METABOLIC PANEL - Abnormal; Notable for the following:    Chloride 97 (*)    Glucose, Bld 169 (*)    BUN 36 (*)    Creatinine, Ser 1.72 (*)    GFR calc non Af Amer 27 (*)    GFR calc Af Amer 31 (*)    All other components within normal limits  URINE MICROSCOPIC-ADD ON - Abnormal; Notable for the following:    Squamous Epithelial / LPF 6-30 (*)    Bacteria, UA MANY (*)    All other components within normal limits  URINE CULTURE  CBC WITH DIFFERENTIAL/PLATELET  I-STAT CG4 LACTIC ACID, ED   BUN  Date Value Ref Range Status  02/22/2016 36* 6 - 20 mg/dL Final  01/25/2016 60* 6 - 20 mg/dL Final  12/22/2015 30* 6 - 20 mg/dL Final  12/12/2015 25* 6 - 20 mg/dL Final   CREATININE, SER  Date Value Ref Range Status  02/22/2016 1.72* 0.44 - 1.00 mg/dL Final  01/25/2016 1.87* 0.44 - 1.00 mg/dL Final  12/22/2015 1.87* 0.44 - 1.00 mg/dL Final  12/12/2015 1.26* 0.44 - 1.00 mg/dL Final     Imaging Review Dg Chest 2 View  02/22/2016  CLINICAL DATA:  82 year old female with weakness and cough for 2 weeks. EXAM: CHEST  2 VIEW COMPARISON:  12/09/2015 and prior chest radiographs FINDINGS: Cardiomegaly and peribronchial thickening again identified. There is no evidence of focal airspace disease, pulmonary edema, suspicious pulmonary nodule/mass, pleural effusion, or pneumothorax. No acute bony abnormalities are identified. Right shoulder arthroplasty changes and  probable left humeral head AVN again noted. IMPRESSION: Cardiomegaly without evidence of acute cardiopulmonary disease. Electronically Signed   By: Margarette Canada M.D.   On: 02/22/2016 17:13   Dg Hip Unilat With Pelvis 2-3 Views Right  02/22/2016  CLINICAL DATA:  Fall from standing position 2 days ago. Right hip pain. EXAM: DG HIP (WITH OR WITHOUT PELVIS) 2-3V RIGHT COMPARISON:  Right hip radiographs 12/09/2015. FINDINGS: The right hip is located. There is marked loss of joint space without significant interval change. No acute fracture is present. The visualized pelvis is intact. Extensive atherosclerotic calcifications are again noted. IMPRESSION: 1. No acute fracture. 2. Advanced degenerative changes of the right hip. Electronically Signed   By: San Morelle M.D.   On: 02/22/2016 16:56   I have personally reviewed and evaluated these images and lab results as part of my medical decision-making.   EKG Interpretation   Date/Time:  Wednesday February 22 2016 14:50:58 EDT Ventricular Rate:  119 PR Interval:    QRS Duration: 96 QT Interval:  321 QTC Calculation: 452 R Axis:   103 Text Interpretation:  Atrial fibrillation Right axis deviation Borderline  repolarization abnormality Baseline wander in lead(s) V1 Confirmed by  [place] ([id number]) on 02/22/2016 3:51:41 PM      MDM   Final diagnoses:  Acute cystitis without hematuria    [name] presents with urinary  frequency, incontinence, and lower extremity pain for the last week.  Findings and plan of care discussed with Virgel Manifold, MD and then with Dr. Oleta Mouse after EDP shift change. Dr. Oleta Mouse personally examined and evaluated this patient.  Patient is nontoxic appearing, afebrile, not tachycardic, not tachypneic, not hypotensive, maintains adequate SPO2 on her home 2 L O2, and is in no apparent distress. Patient has no signs of sepsis or other serious or life-threatening condition. 4:56 PM Spoke with Jonni Sanger, pharmacist,  for recommendations on ABX therapy for patient's UTI due to her extensive allergy list and history of heart failure. Recommends Augmentin 500mg  q12 hours. Pt is noted to have taken this in the past and tolerated it. Patient was given a dose of Augmentin here in the ED and observed with no ill effect. Patient's seemingly erratic pulse rates were evaluated and are likely due to her A. fib. Her true pulse rate did not vary this much. Patient was able to ambulate here in the ED with a walker, which is her normal mode of ambulation. Patient is accompanied by her husband and sister-in-law, who both state they will be watching out for the patient and assisting her at home. The patient was given instructions for home care as well as return precautions. Patient voices understanding of these instructions, accepts the plan, and is comfortable with discharge.   Filed Vitals:   02/22/16 1413 02/22/16 1500 02/22/16 1515 02/22/16 1616  BP:  128/97 134/103 134/91  Pulse:  86 109 71  Temp:      TempSrc:      Resp:  21 19   Height:      Weight:      SpO2: 95% 100% 99% 100%   Filed Vitals:   02/22/16 1515 02/22/16 1616 02/22/16 1630 02/22/16 1645  BP: 134/103 134/91 142/72 140/76  Pulse: 109 71 30 49  Temp:      TempSrc:      Resp: 19     Height:      Weight:      SpO2: 99% 100% 94% 100%       Lorayne Bender, PA-C 02/22/16 1937  Virgel Manifold, MD 02/24/16 [phone]

## 2016-02-24 LAB — URINE CULTURE: Culture: >=100000 — AB

## 2016-02-25 ENCOUNTER — Telehealth: Payer: Self-pay | Admitting: *Deleted

## 2016-02-25 NOTE — Telephone Encounter (Signed)
Post ED Visit - Positive Culture Follow-up  Culture report reviewed by antimicrobial stewardship pharmacist:  []  Elenor Quinones, Pharm.D. []  Heide Guile, Pharm.D., BCPS []  Parks Neptune, Pharm.D. []  Alycia Rossetti, Pharm.D., BCPS []  [place], Pharm.D., BCPS, AAHIVP []  Legrand Como, Pharm.D., BCPS, AAHIVP []  Cassie Stewart, Pharm.D. [x]  Rob Evette Doffing, Pharm.D.  Positive urine culture Treated with Amoxicillin-Pot Clavulanate, organism sensitive to the same and no further patient follow-up is required at this time.  Harlon Flor [facility] 02/25/2016, 1:08 PM

## 2016-03-29 ENCOUNTER — Ambulatory Visit (HOSPITAL_COMMUNITY)
Admission: RE | Admit: 2016-03-29 | Discharge: 2016-03-29 | Disposition: A | Discharge status: Home / Self Care | Payer: Medicare Other | Source: Ambulatory Visit | Attending: Internal Medicine | Admitting: Internal Medicine

## 2016-03-29 ENCOUNTER — Encounter (HOSPITAL_COMMUNITY): Payer: Self-pay | Admitting: Internal Medicine

## 2016-03-29 VITALS — BP 124/66 | HR 69 | Wt 150.2 lb

## 2016-03-29 DIAGNOSIS — Z8673 Personal history of transient ischemic attack (TIA), and cerebral infarction without residual deficits: Secondary | ICD-10-CM | POA: Diagnosis not present

## 2016-03-29 DIAGNOSIS — Z8249 Family history of ischemic heart disease and other diseases of the circulatory system: Secondary | ICD-10-CM | POA: Diagnosis not present

## 2016-03-29 DIAGNOSIS — Z8543 Personal history of malignant neoplasm of ovary: Secondary | ICD-10-CM | POA: Insufficient documentation

## 2016-03-29 DIAGNOSIS — M1990 Unspecified osteoarthritis, unspecified site: Secondary | ICD-10-CM | POA: Diagnosis not present

## 2016-03-29 DIAGNOSIS — M797 Fibromyalgia: Secondary | ICD-10-CM | POA: Diagnosis not present

## 2016-03-29 DIAGNOSIS — M109 Gout, unspecified: Secondary | ICD-10-CM | POA: Insufficient documentation

## 2016-03-29 DIAGNOSIS — I5189 Other ill-defined heart diseases: Secondary | ICD-10-CM

## 2016-03-29 DIAGNOSIS — I130 Hypertensive heart and chronic kidney disease with heart failure and stage 1 through stage 4 chronic kidney disease, or unspecified chronic kidney disease: Secondary | ICD-10-CM | POA: Diagnosis not present

## 2016-03-29 DIAGNOSIS — Z79899 Other long term (current) drug therapy: Secondary | ICD-10-CM | POA: Diagnosis not present

## 2016-03-29 DIAGNOSIS — Z87440 Personal history of urinary (tract) infections: Secondary | ICD-10-CM | POA: Insufficient documentation

## 2016-03-29 DIAGNOSIS — I2510 Atherosclerotic heart disease of native coronary artery without angina pectoris: Secondary | ICD-10-CM | POA: Insufficient documentation

## 2016-03-29 DIAGNOSIS — I482 Chronic atrial fibrillation, unspecified: Secondary | ICD-10-CM

## 2016-03-29 DIAGNOSIS — K219 Gastro-esophageal reflux disease without esophagitis: Secondary | ICD-10-CM | POA: Insufficient documentation

## 2016-03-29 DIAGNOSIS — E1122 Type 2 diabetes mellitus with diabetic chronic kidney disease: Secondary | ICD-10-CM | POA: Diagnosis not present

## 2016-03-29 DIAGNOSIS — Z9981 Dependence on supplemental oxygen: Secondary | ICD-10-CM | POA: Diagnosis not present

## 2016-03-29 DIAGNOSIS — G4733 Obstructive sleep apnea (adult) (pediatric): Secondary | ICD-10-CM | POA: Diagnosis not present

## 2016-03-29 DIAGNOSIS — Z7901 Long term (current) use of anticoagulants: Secondary | ICD-10-CM | POA: Insufficient documentation

## 2016-03-29 DIAGNOSIS — IMO0002 Reserved for concepts with insufficient information to code with codable children: Secondary | ICD-10-CM

## 2016-03-29 DIAGNOSIS — Z955 Presence of coronary angioplasty implant and graft: Secondary | ICD-10-CM | POA: Insufficient documentation

## 2016-03-29 DIAGNOSIS — I5032 Chronic diastolic (congestive) heart failure: Secondary | ICD-10-CM | POA: Insufficient documentation

## 2016-03-29 DIAGNOSIS — Z87891 Personal history of nicotine dependence: Secondary | ICD-10-CM | POA: Diagnosis not present

## 2016-03-29 DIAGNOSIS — Z794 Long term (current) use of insulin: Secondary | ICD-10-CM | POA: Insufficient documentation

## 2016-03-29 DIAGNOSIS — I519 Heart disease, unspecified: Secondary | ICD-10-CM

## 2016-03-29 DIAGNOSIS — Z833 Family history of diabetes mellitus: Secondary | ICD-10-CM | POA: Diagnosis not present

## 2016-03-29 DIAGNOSIS — Z86718 Personal history of other venous thrombosis and embolism: Secondary | ICD-10-CM | POA: Insufficient documentation

## 2016-03-29 DIAGNOSIS — I272 Other secondary pulmonary hypertension: Secondary | ICD-10-CM | POA: Insufficient documentation

## 2016-03-29 DIAGNOSIS — N184 Chronic kidney disease, stage 4 (severe): Secondary | ICD-10-CM | POA: Diagnosis not present

## 2016-03-29 DIAGNOSIS — Z825 Family history of asthma and other chronic lower respiratory diseases: Secondary | ICD-10-CM | POA: Diagnosis not present

## 2016-03-29 DIAGNOSIS — J449 Chronic obstructive pulmonary disease, unspecified: Secondary | ICD-10-CM | POA: Diagnosis not present

## 2016-03-29 DIAGNOSIS — E039 Hypothyroidism, unspecified: Secondary | ICD-10-CM | POA: Insufficient documentation

## 2016-03-29 LAB — BASIC METABOLIC PANEL
ANION GAP: 9 (ref 5–15)
BUN: 46 mg/dL — ABNORMAL HIGH (ref 6–20)
CHLORIDE: 99 mmol/L — AB (ref 101–111)
CO2: 28 mmol/L (ref 22–32)
CREATININE: 2 mg/dL — AB (ref 0.44–1.00)
Calcium: 10.2 mg/dL (ref 8.9–10.3)
GFR calc non Af Amer: 23 mL/min — ABNORMAL LOW (ref >60)
GFR, EST AFRICAN AMERICAN: 26 mL/min — AB (ref >60)
Glucose, Bld: 149 mg/dL — ABNORMAL HIGH (ref 65–99)
Potassium: 3.7 mmol/L (ref 3.5–5.1)
Sodium: 136 mmol/L (ref 135–145)

## 2016-03-29 LAB — BRAIN NATRIURETIC PEPTIDE: B Natriuretic Peptide: 1231.3 pg/mL — ABNORMAL HIGH (ref 0.0–100.0)

## 2016-03-29 MED ORDER — METOLAZONE 2.5 MG PO TABS: 2.5000 mg | ORAL_TABLET | ORAL | 3 refills | Status: AC | PRN

## 2016-03-29 NOTE — Patient Instructions (Signed)
Routine lab work today. Will notify you of abnormal results  Your goal weight is 146-147 lbs. If your weight  Is greater than 147lbs please take Metolazone 2.5mg .   Follow up in 4-6 months

## 2016-03-29 NOTE — Progress Notes (Signed)
Patient ID: [name], female   DOB: [birth year], 82 y.o.   MRN: [id number]     Advanced Heart Failure Clinic Note  PCP: Dr Addison Lank  Primary HF Cardiologist: Dr Vaughan Browner  HPI: [name] is a 82 y.o. female with history of chronic diastolic HF with RV dysfunction, OSA, COPD, pulmonary hypertension, chronic rate controlled A fib, type 2 diabetes.  She was admitted to [facility] on 03/16/15 when she presented with with worsening SOB, orthopnea, and PND. BNP 1109. CXR showing pulmonary congestion. Underwent 2d echo and right heart catheterization as below. Discharge weight 175, and diuretic regimen was decreased from Lasix 40 BID to 60 daily.  Admitted 03/16/15 with increased dyspnea. Diuresed with IV lasix and transitioned to lasix 40 mg daily. Has AKI noted on admit with creatinine up to 2.6. Creatinine down to 1.65 on the day of discharge. Discharge weight was 180 pounds.   Admitted 06/27/15 with AKI. Lasix had recently been increase and pt has narrow euvolemic window of 167-170. Was 164 at PCP. Labs at PCP showed Cr 1.8 -> 2.6 (06/23/15). Creatinine was 3.1 on arrival to ED with weight 163.8. Discharge weight was 161 lb, despite holding of diuretics and gentle IVF. Lasix decreased to 60 mg BID.   Admitted 4/28 - 12/12/15 with lethargy and generalized weakness. Weighed 147 lbs on admit.    Thought to be dehydrated. She was + 1.7 L and up 10 lbs. D/c weight 157 lbs. Lasix dose decreased to 40 mg BID  She presents today for regular follow up. Weight down 9 lbs from previous visit. Weight at home 149 at home. She states she has been steadily losing weight despite drinking protein drinks.  Appetite "fair", doesn't eat very much, usually skips lunch.  Primary complaint is joint/muscle pain. Recently diagnosed with fibromyalgia and has known arthritis. Takes tramadol.  "takes the edge off". Would like to be started on Neurontin, but PCP will not start. Has been treated for UTIs in the past several  weeks, including a trip to the ED.  Breathing has been good, much more limited by pain. She states she took a metolazone Monday for swelling.   Labs 11/16 K 4.1, Creatinine 2.01=> 1.89 Labs 12/09/15 K 2.7, Cr 1.79, Calcium > 15.0 (Admitted) Labs 12/12/15 K 4.1, Creatinine 1.26  RHC 03/21/15 RA = 14 RV = 60/10/14 PA = 65/30 (43) PCW = 20 Fick cardiac output/index = 3.8/2.1 PVR = 6.1 WU Arterial sat = 94% PA sat = 55%, 56%  Echo 03/17/15 EF 60-65%, RV severely dilated with moderately reduced systolic function. PA peak pressure 68 mm Hg.  ROS: All systems negative except as listed in HPI, PMH and Problem List.  SH:  Social History   Social History  . Marital status: Married    Spouse name: N/A  . Number of children: 2  . Years of education: N/A   Occupational History  . retired     Social History Main Topics  . Smoking status: Former Smoker    Quit date: 08/13/1978  . Smokeless tobacco: Never Used  . Alcohol use No  . Drug use: No  . Sexual activity: No   Other Topics Concern  . Not on file   Social History Narrative   Lives with husband       FH:  Family History  Problem Relation Age of Onset  . Pneumonia Mother   . Heart disease Mother   . Allergies Mother   . Emphysema Mother   .  Arthritis Mother   . Hypertension Mother   . Heart attack Father   . Hypertension Father   . Diabetes Sister   . Heart disease Sister   . Diabetes Brother   . Diabetes      grandfather  . Breast cancer      aunt  . Bone cancer      aunt  . Clotting disorder Brother   . Arthritis Sister   . Breast cancer Maternal Aunt     Past Medical History:  Diagnosis Date  . Acid reflux disease   . Acute diastolic heart failure ([place])   . Acute rhinosinusitis   . Anemia   . Blood transfusion without reported diagnosis 2009   had internal bleeding  . Bradycardia   . CAD (coronary artery disease)   . Cancer ([place]) 1959   ovarian cancer  . CHF (congestive heart failure) ([place])   .  Chronic pain   . COPD (chronic obstructive pulmonary disease) ([place])   . Diabetes mellitus   . DVT (deep venous thrombosis) ([place]) 2003   left leg  . Hx of colonic polyps   . Hypertension   . Hypothyroidism   . Hypoxemia   . Lung nodule    lingula  . Obesity hypoventilation syndrome ([place])   . Obesity, endogenous   . OSA (obstructive sleep apnea)    NPSG 04/03/00--AHI 28.hr  . Osteoarthritis, generalized   . Osteoporosis   . Paroxysmal atrial fibrillation ([facility])   . Peripheral edema   . Pruritus   . Renal artery stenosis ([place])   . Secondary pulmonary hypertension ([place]) 06/09/2013  . Stroke ([place])   . Tracheobronchitis   . Vaginal atrophy     Current Outpatient Prescriptions  Medication Sig Dispense Refill  . acetaminophen (TYLENOL) 500 MG tablet Take 1,000 mg by mouth every 6 (six) hours as needed for mild pain. Reported on 12/22/2015    . albuterol (PROVENTIL HFA;VENTOLIN HFA) 108 (90 BASE) MCG/ACT inhaler Inhale 1 puff into the lungs every 4 (four) hours as needed for wheezing or shortness of breath.    Marland Kitchen albuterol (PROVENTIL) (2.5 MG/3ML) 0.083% nebulizer solution Take 3 mLs (2.5 mg total) by nebulization every 2 (two) hours as needed for wheezing or shortness of breath. 75 mL 3  . allopurinol (ZYLOPRIM) 100 MG tablet Take 100 mg by mouth daily.    Marland Kitchen ALPRAZolam (XANAX) 0.5 MG tablet take 1 tablet by mouth every 8 hours if needed  0  . amLODipine (NORVASC) 2.5 MG tablet     . apixaban (ELIQUIS) 2.5 MG TABS tablet Take 2.5 mg by mouth 2 (two) times daily.    Marland Kitchen azelastine (ASTELIN) 137 MCG/SPRAY nasal spray 1-2 sprays each nostril once or twice daily 90 mL 3  . bisacodyl (DULCOLAX) 5 MG EC tablet Take 5 mg by mouth daily as needed for moderate constipation.    . budesonide (PULMICORT) 0.25 MG/2ML nebulizer solution Take 2 mLs (0.25 mg total) by nebulization daily. 60 mL prn  . colchicine 0.6 MG tablet Take 1.2 mg by mouth daily as needed (for gout flareups).     . CRESTOR 20 MG  tablet Take 20 mg by mouth daily.     Marland Kitchen diltiazem (TIAZAC) 120 MG 24 hr capsule Take 120 mg by mouth daily.    . diphenhydrAMINE (BENADRYL) 25 MG tablet Take 25 mg by mouth at bedtime as needed for sleep.    . furosemide (LASIX) 40 MG tablet Take 60 mg by mouth 2 (  two) times daily.    Marland Kitchen guaiFENesin (MUCINEX) 600 MG 12 hr tablet Take 600 mg by mouth daily.    . insulin glargine (LANTUS) 100 UNIT/ML injection Inject 20-35 Units into the skin 2 (two) times daily. Sliding scale    . insulin lispro (HUMALOG) 100 UNIT/ML injection Inject 3 Units into the skin 2 (two) times daily with breakfast and lunch. Per sliding scale. Patient unsure about how much she really takes    . isosorbide mononitrate (IMDUR) 30 MG 24 hr tablet Take 30 mg by mouth daily.    Marland Kitchen levothyroxine (SYNTHROID, LEVOTHROID) 25 MCG tablet Take 25 mcg by mouth See admin instructions. Take 1 tablet (25 mcg) on MON, WED, FRI, SUN    . levothyroxine (SYNTHROID, LEVOTHROID) 50 MCG tablet Take 50 mcg by mouth See admin instructions. Take 50mg  on Tues, Thurs, Sat    . metolazone (ZAROXOLYN) 2.5 MG tablet Take 1 tablet (2.5 mg total) by mouth as needed (for 160 lb or greater). 90 tablet 3  . metoprolol (LOPRESSOR) 50 MG tablet     . ondansetron (ZOFRAN) 4 MG tablet Take 1 tablet (4 mg total) by mouth every 8 (eight) hours as needed for nausea or vomiting. 30 tablet 0  . oxybutynin (DITROPAN XL) 10 MG 24 hr tablet Take 10 mg by mouth daily. Reported on 01/24/2016    . Potassium Chloride ER 20 MEQ TBCR Take 10 mEq by mouth daily. Take an extra 40 meq when you take Metolazone 30 tablet 3  . traMADol (ULTRAM) 50 MG tablet Take 50 mg by mouth every 6 (six) hours as needed for moderate pain.     . nitroGLYCERIN (NITROSTAT) 0.4 MG SL tablet Place 0.4 mg under the tongue every 5 (five) minutes as needed for chest pain.      No current facility-administered medications for this encounter.     Vitals:   03/29/16 1420  BP: 124/66  Pulse: 69  SpO2:  95%  Weight: 150 lb 4 oz (68.2 kg)   Wt Readings from Last 3 Encounters:  03/29/16 150 lb 4 oz (68.2 kg)  02/22/16 149 lb (67.6 kg)  01/25/16 159 lb (72.1 kg)     PHYSICAL EXAM:  General:  Elderly appearing. No resp distress. HEENT: normal. On 2 liters [place].  Neck: supple. JVP ~8-9 cm with HJR. Carotids 2+ bilaterally; no bruits. No thyromegaly or lymphadenopathy noted. Cor: PMI normal. Irregular rhythm. No M/G/R appreciated Lungs: Scant crackles. Abdomen: obese, soft, NT, ND, no HSM. No bruits or masses. +BS  Extremities: no cyanosis, clubbing, rash. Marked varicose veins. Trace ankle edema Neuro: alert & orientedx3, cranial nerves grossly intact. Moves all 4 extremities w/o difficulty. Affect pleasant.  ASSESSMENT & PLAN: 1. Chronic diastolic CHF/right heart failure: Echo 03/17/15 EF 60-65%, RV severely dilated with moderately reduced systolic function.  NYHA class II-III symptoms.  Volume status mildly elevated on exam.  - Continue Lasix 60 mg bid.  Check BMET today.  - Should take metolazone 2.5 mg as needed for weights > 149 lbs.  Her goal weight should be 146-147 lbs at home.  Should take an addition 40 meq potassium when given metolazone.  - Reinforced daily weights, limiting fluids and making low salt food choices.  2. Pulmonary Hypertension: PA peak pressure 68 mm Hg last echo. RHC PA 65/30 (43). Had [place] 03/2015 with PVR 6. Suspect mostly group 3 pulmonary hypertension due to chronic lung disease.  - Continue current management at this point.  Will not start  pulmonary vasodilator.  -Continue home oxygen and CPAP at night.  3. COPD: On chronic 2 liters 02 4. A fib: Chronic.  Remains in A fib by exam  - Rate controlled on metoprolol and cardizem.  - No bleeding on Eliquis 2.5 mg BID.  5. H/o CAD s/p stenting of LAD 2008.  No evidence of ischemia.  6. CKD stage IV: Check BMET today.  7. OHS/OSA: - Continue nightly CPAP and oxygen during the day. 8. Gout  - Per primary 9.  Arthritis/FM - OK from cardiac standpoint to try Gabapentin.  Will leave prescription and management up to PCP.   BMET and BNP today. Med changes as above. Follow up 4-6 months.    Shirley Friar PA-C 03/29/2016   Patient seen and examined with Oda Kilts, PA-C. We discussed all aspects of the encounter. I agree with the assessment and plan as stated above.   Volume status mildly elevated on exam with stable NYHA III symptoms. Reinforced need for daily weights and reviewed use of sliding scale diuretics with metolazone and extra potassium. Continue Eliquis for AF. CAD is stable. She continue to lose weight and I am concerned about her overall prognosis. Ok to use gabapentin for pain from our perspective.   Bensimhon, Daniel,MD 1:34 AM

## 2016-04-02 ENCOUNTER — Telehealth (HOSPITAL_COMMUNITY): Payer: Self-pay

## 2016-04-02 NOTE — Telephone Encounter (Signed)
Patient called CHF clinic triage to request last OV note from our office be faxed to PCP as it states from cardiac standpoint ok to try gabapentin for fibromyalgia but must be prescribed by PCP office. OV note faxed to office Attn: Dr. Nicanor Alcon, Guinevere Ferrari, RN

## 2016-04-12 ENCOUNTER — Telehealth: Payer: Self-pay | Admitting: Neurology

## 2016-04-12 DIAGNOSIS — I482 Chronic atrial fibrillation, unspecified: Secondary | ICD-10-CM

## 2016-04-12 MED ORDER — APIXABAN 2.5 MG PO TABS
2.5000 mg | ORAL_TABLET | Freq: Two times a day (BID) | ORAL | 11 refills | Status: DC
Start: 2016-04-12 — End: 2016-04-17

## 2016-04-12 NOTE — Telephone Encounter (Signed)
Please let her know I have refilled her eliquis 2.5mg  twice a day for one year. She can pick up in her [employer] at [employer]. Thanks.   Rosalin Hawking, MD PhD Stroke Neurology 04/12/2016 6:06 PM

## 2016-04-12 NOTE — Telephone Encounter (Signed)
Patient requesting refill of apixaban (ELIQUIS) 2.5 MG TABS tablet Pharmacy: pick up

## 2016-04-12 NOTE — Telephone Encounter (Signed)
Dr.Xu pt wants eliquis refill. It was prescribed by you last year A different Md prescribed in April 2017. Do you want to refill. Advise me thanks.

## 2016-04-13 NOTE — Telephone Encounter (Signed)
Pt called requesting a written rx be done. She wants to take it to [facility] to have it filled. Please call the pt to give update . [phone]

## 2016-04-13 NOTE — Telephone Encounter (Signed)
RN call patient about needing a written prescription being done so she can take it to [facility]. Rn stated her message on 04/12/2016 stated she needed a refill only. Rn stated the phone note did not say anything about [place], and written rx. Pt begin to raise her voice and stated "I did tell them [place] and that I needed it in the written form. " Rn apologize to patient and advise her to lower her voice. Rn stated a written rx can be done, but it the future make sure when she calls to be specific with details. Rn stated Dr. Erlinda Hong will be in the office on Tuesday and it can be done at that time. Pt stated she cancel the prescription at the [facility]. Pt stated its cheaper to get it at [facility] [place]. Pt stated it can wait until Tuesday. Rn reminded pt we are close on Monday for labor day.

## 2016-04-17 ENCOUNTER — Other Ambulatory Visit: Payer: Self-pay

## 2016-04-17 ENCOUNTER — Other Ambulatory Visit (HOSPITAL_COMMUNITY): Payer: Self-pay | Admitting: *Deleted

## 2016-04-17 DIAGNOSIS — I482 Chronic atrial fibrillation, unspecified: Secondary | ICD-10-CM

## 2016-04-17 MED ORDER — AMLODIPINE BESYLATE 2.5 MG PO TABS: 2.5000 mg | ORAL_TABLET | Freq: Every day | ORAL | 2 refills | Status: AC

## 2016-04-17 MED ORDER — APIXABAN 2.5 MG PO TABS: 2.5000 mg | ORAL_TABLET | Freq: Two times a day (BID) | ORAL | 2 refills | Status: AC

## 2016-04-17 MED ORDER — DILTIAZEM HCL ER BEADS 120 MG PO CP24: 120.0000 mg | ORAL_CAPSULE | Freq: Every day | ORAL | 2 refills | Status: AC

## 2016-04-17 MED ORDER — ISOSORBIDE MONONITRATE ER 30 MG PO TB24: 30.0000 mg | ORAL_TABLET | Freq: Every day | ORAL | 2 refills | Status: AC

## 2016-04-17 MED ORDER — FUROSEMIDE 40 MG PO TABS: 60.0000 mg | ORAL_TABLET | Freq: Two times a day (BID) | ORAL | 2 refills | Status: AC

## 2016-04-17 MED ORDER — ALLOPURINOL 100 MG PO TABS: 100.0000 mg | ORAL_TABLET | Freq: Every day | ORAL | 2 refills | Status: AC

## 2016-04-17 MED ORDER — APIXABAN 2.5 MG PO TABS: 2.5000 mg | ORAL_TABLET | Freq: Two times a day (BID) | ORAL | 11 refills | Status: DC

## 2016-04-17 NOTE — Telephone Encounter (Signed)
PTs husband came to office today to get paper prescription of eliquis for one year refill for patient. Pt is going to the [place] to get refill. Pts husband pick up.

## 2016-05-01 ENCOUNTER — Telehealth (HOSPITAL_COMMUNITY): Payer: Self-pay
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
## 2016-05-08 ENCOUNTER — Telehealth (HOSPITAL_COMMUNITY): Payer: Self-pay | Admitting: *Deleted
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
## 2016-07-13 DEATH — deceased

## 2016-08-20 ENCOUNTER — Other Ambulatory Visit: Payer: Self-pay | Admitting: *Deleted

## 2016-08-20 NOTE — Patient Outreach (Signed)
[facility] [facility]) Care Management  08/20/2016  [facility] May 30, 1934 [id number]   This patient is a referral to [place] [facility]. At this time this patient is a Hospice patient. Since this patient is in an external program . She does not meet the [facility] program criteria.  [place] Management [phone].

## 2017-01-13 IMAGING — DX DG CHEST 2V
2 series · 2 of 2 positions shown · non-contrast
Comparison: 03/16/2015

CLINICAL DATA: Cough and nausea for 2 months, dyspnea, coronary
artery disease, CHF, atrial fibrillation, COPD, diabetes mellitus,
coronary artery disease, former smoker

EXAM:
CHEST  2 VIEW

[chest pa]
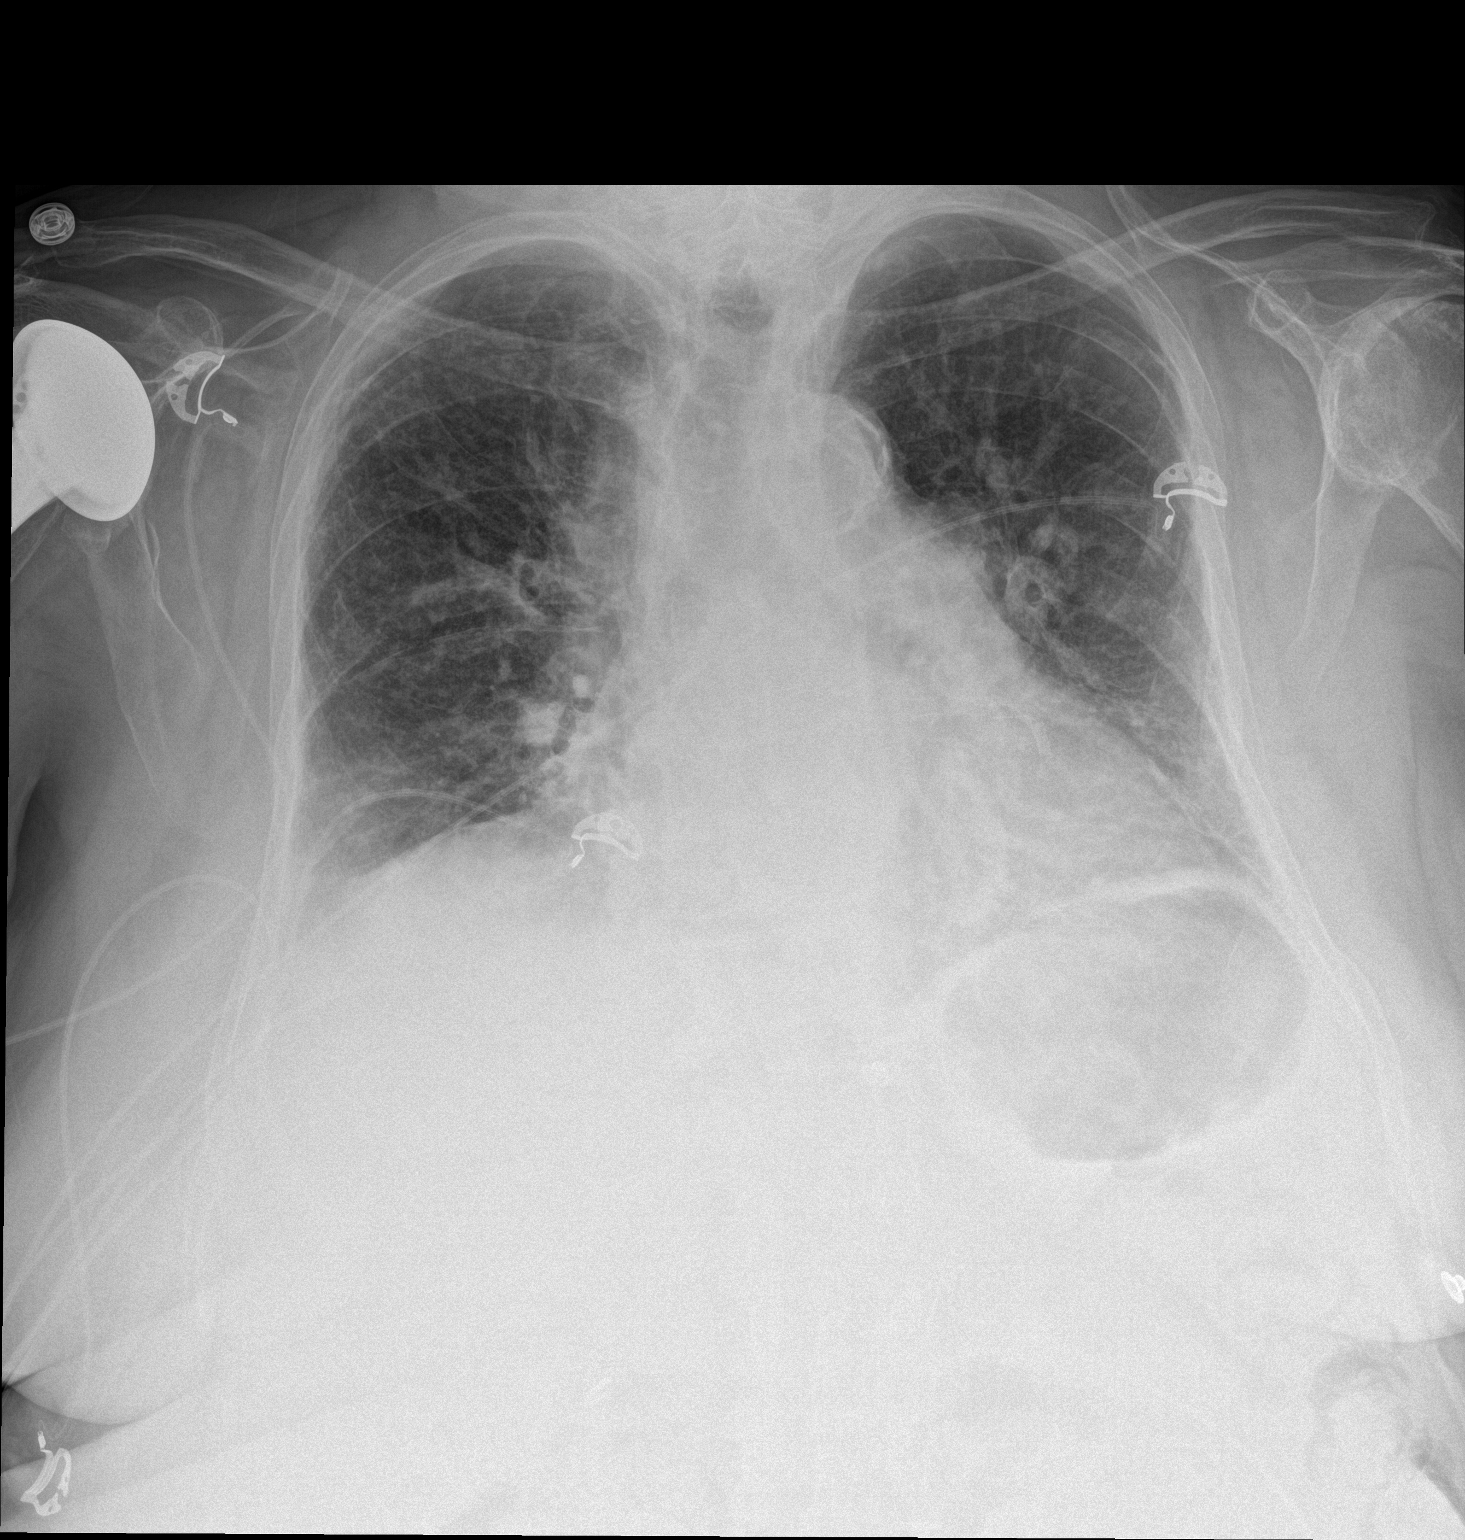

[chest lat]
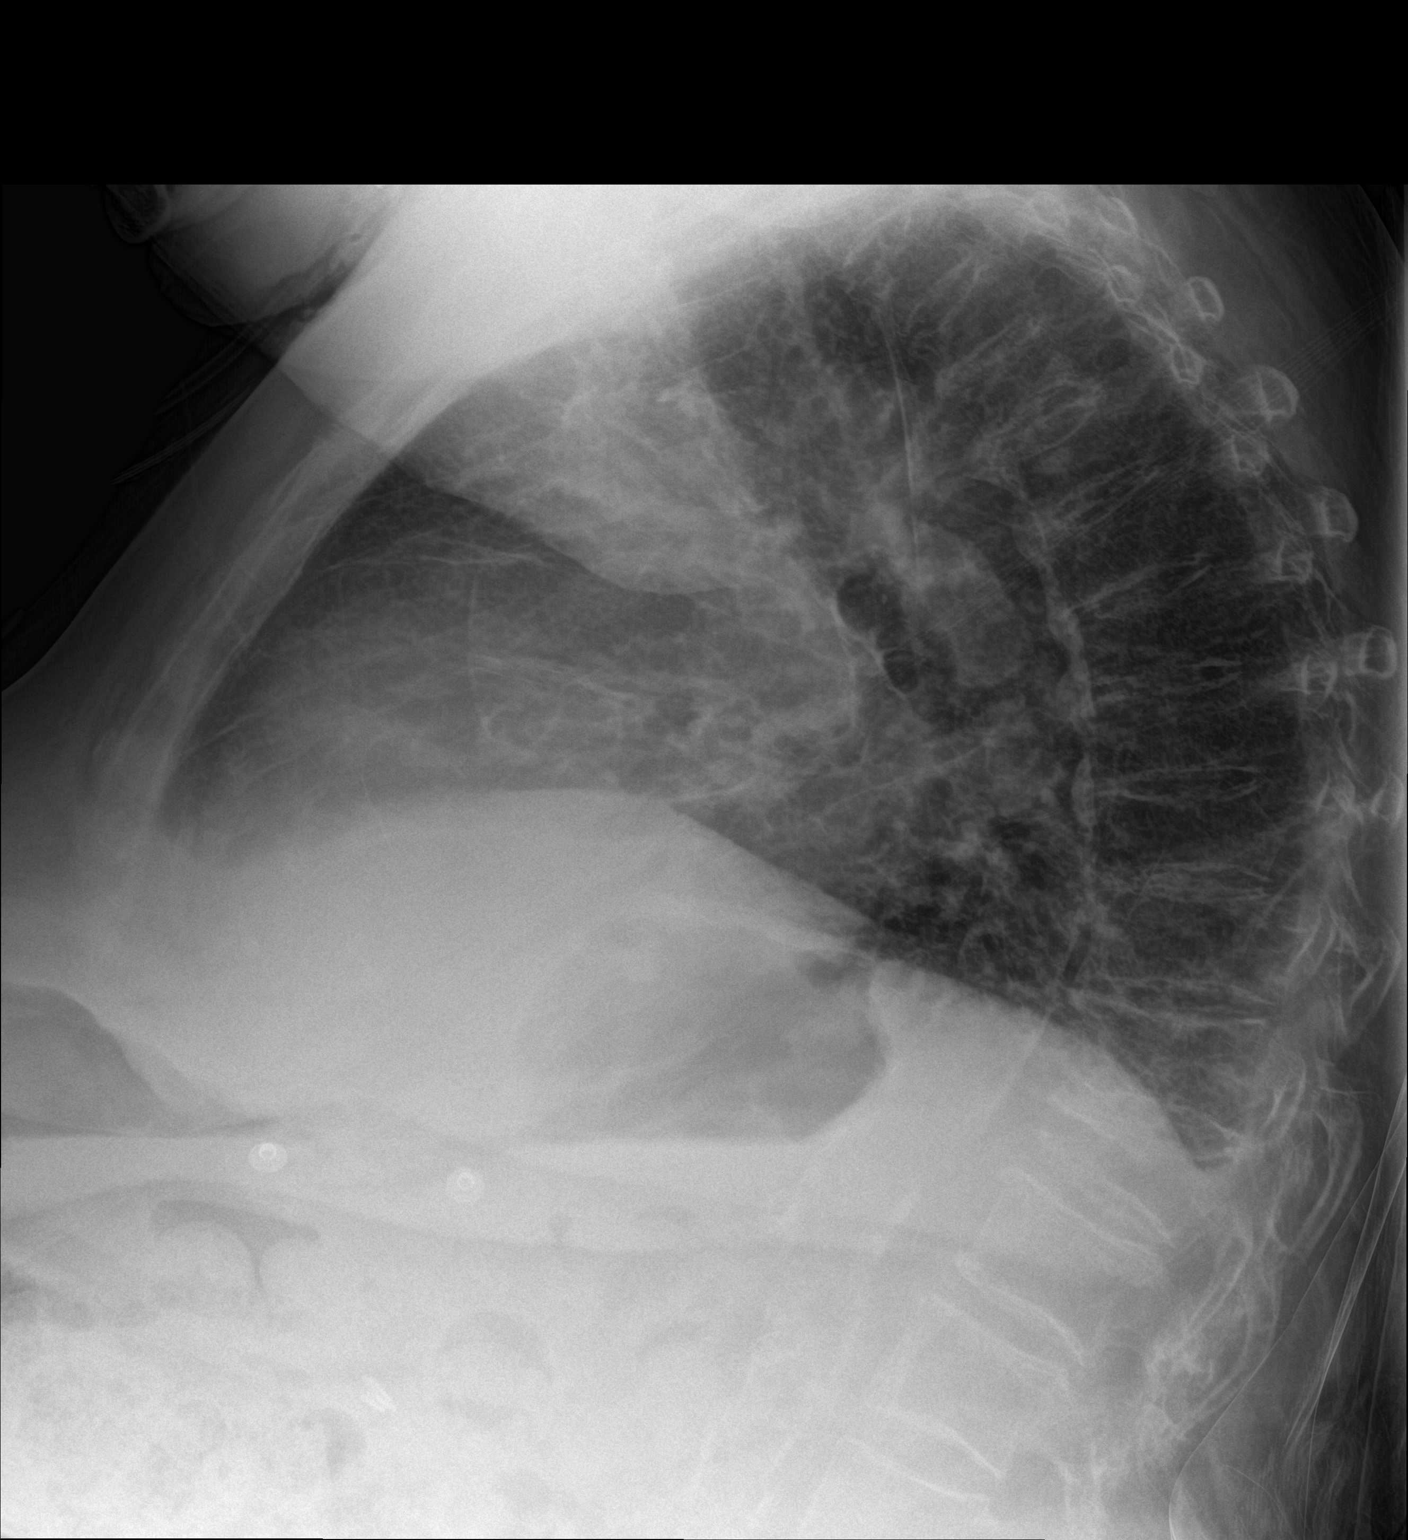

[2 of 2 positions shown; findings below may reference images not displayed]

FINDINGS: Enlargement of cardiac silhouette with vascular congestion.

Atherosclerotic calcification aorta.

Peribronchial thickening with chronic accentuation of interstitial
markings little changed.

Mitral annular calcification noted.

Bibasilar atelectasis.

No pulmonary infiltrate, pleural effusion or pneumothorax.

Osseous demineralization with compression deformity of a vertebral
body at the thoracolumbar junction question T12 unchanged.

RIGHT shoulder prosthesis with LEFT glenohumeral degenerative
changes noted.

Surgical clips RIGHT upper quadrant likely prior cholecystectomy.
IMPRESSION: Chronic bronchitic and interstitial changes with bibasilar
atelectasis.

Enlargement of cardiac silhouette with pulmonary vascular
congestion.

## 2017-01-20 IMAGING — CR DG CHEST 2V
2 series · 2 of 2 positions shown · non-contrast
Comparison: PA and lateral chest x-ray March 18, 2015

CLINICAL DATA: Shortness of breath, cough, history of COPD, CHF,
pulmonary hypertension.

EXAM:
CHEST  2 VIEW

[chest pa]
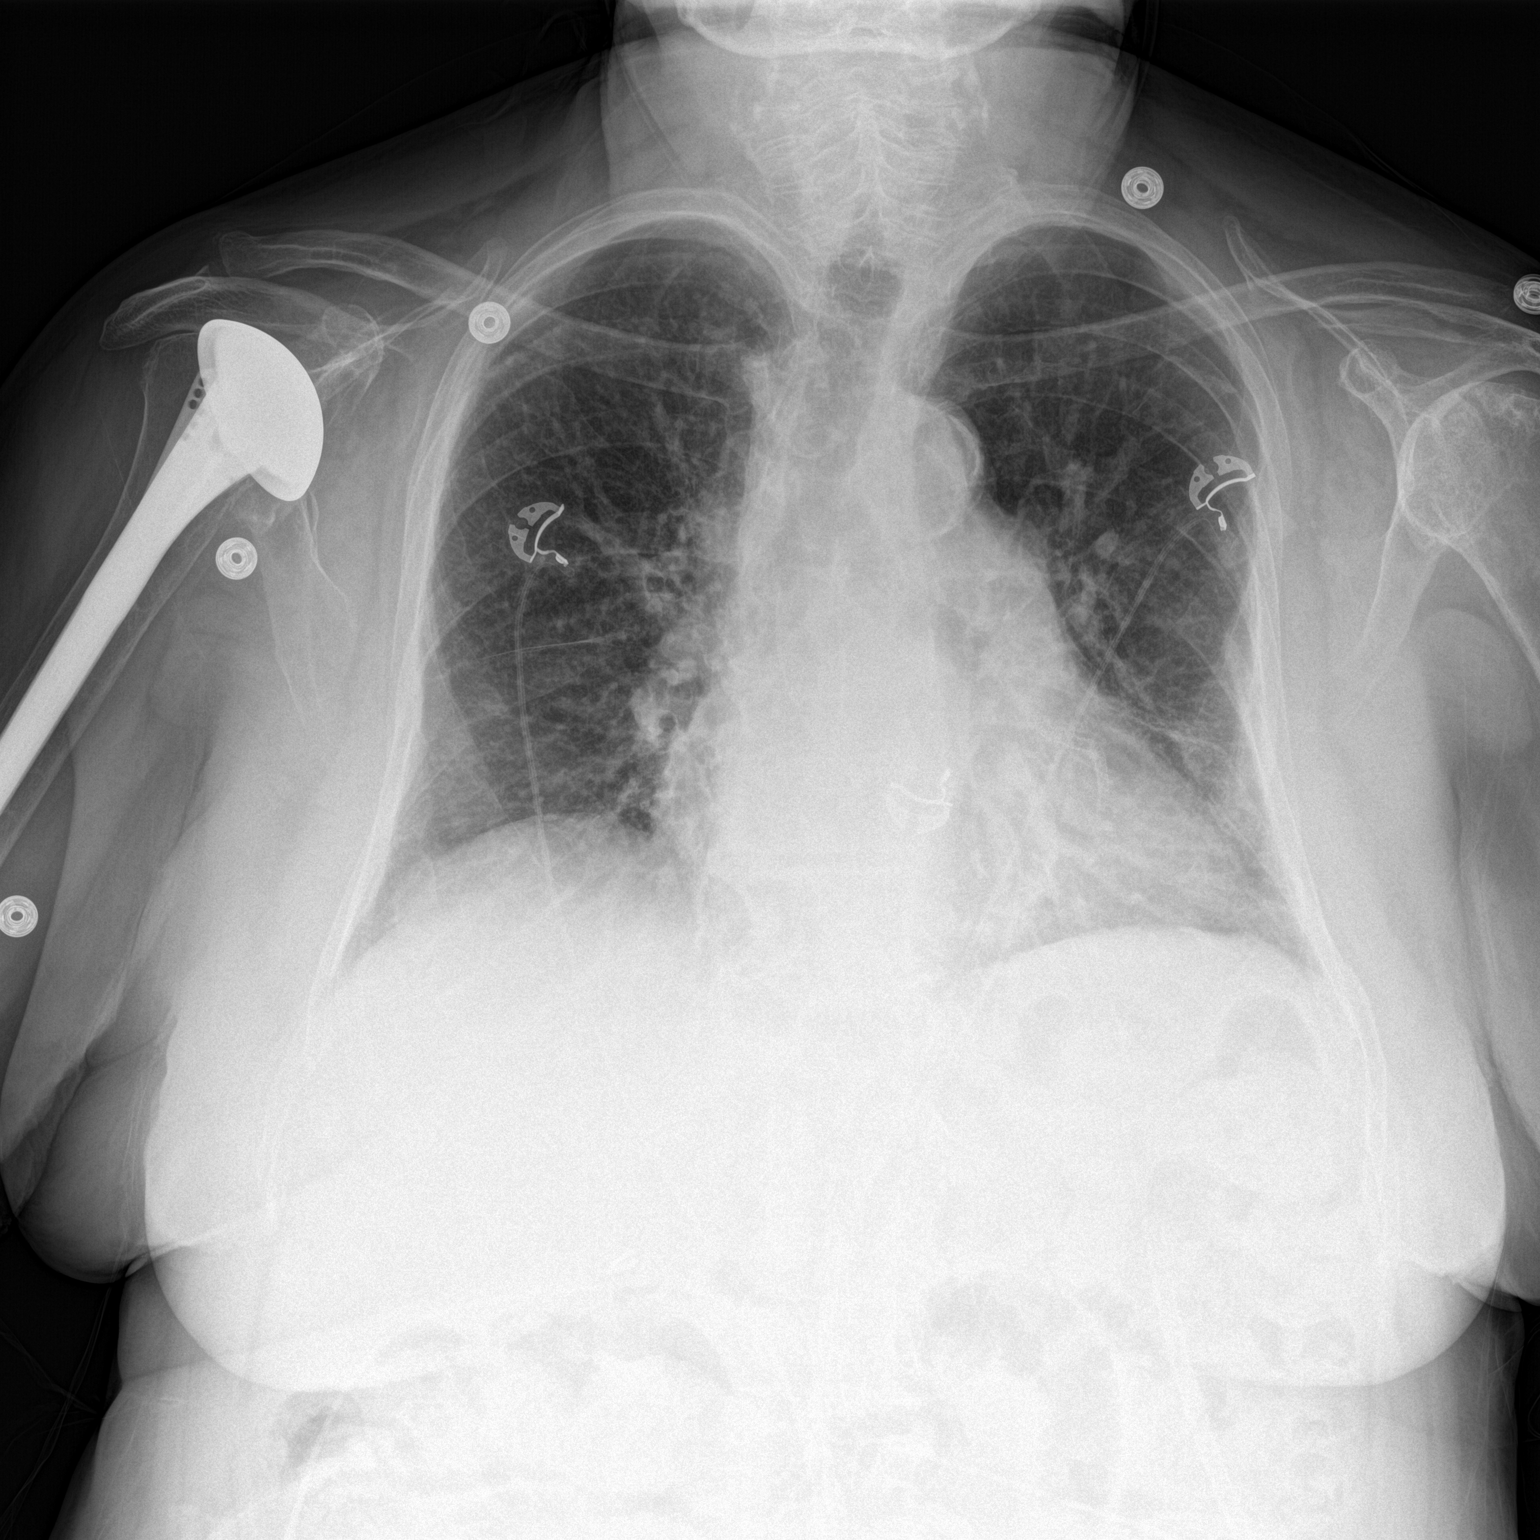

[chest lat]
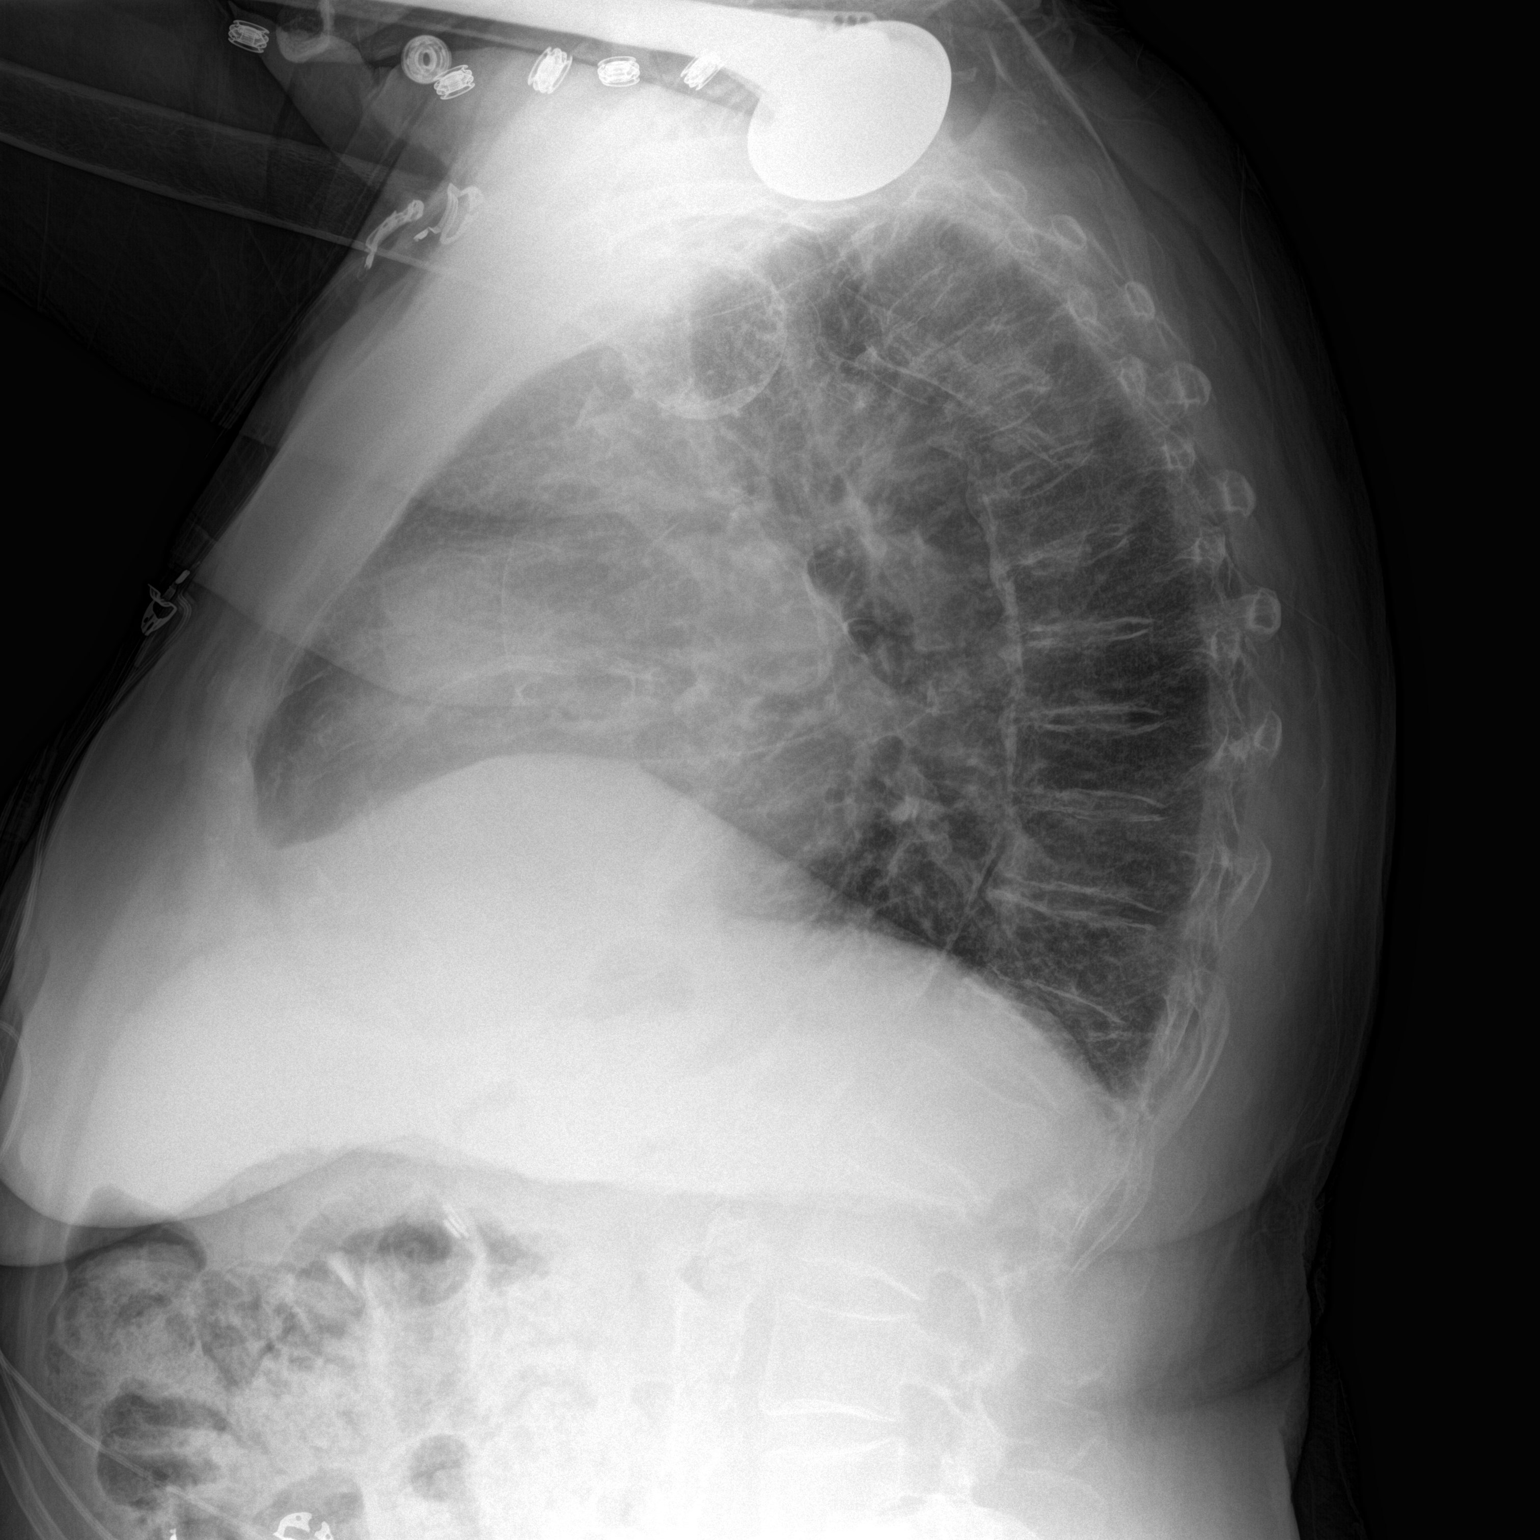

[2 of 2 positions shown; findings below may reference images not displayed]

FINDINGS: The appearance of the pulmonary interstitium has improved which
likely reflects some resolution of interstitial edema. The cardiac
silhouette remains enlarged. The pulmonary vascularity is less
prominent centrally. There is no pleural effusion. There is wedge
compression of the body of T12 which is stable and there is mild
loss of height of approximately T3 and T5. There is a prosthetic
right shoulder joint.
IMPRESSION: Slight interval improvement in the appearance of the chest. There
are chronic bronchitic changes with decreasing pulmonary
interstitial edema.

## 2017-01-23 ENCOUNTER — Ambulatory Visit: Payer: Medicare Other | Admitting: Neurology
